# Patient Record
Sex: Female | Born: 1950 | Race: Black or African American | Hispanic: No | Marital: Married | Smoking: Never smoker
Health system: Southern US, Community
[De-identification: ages and names within clinical notes are randomized; demographics above are authoritative.]

## PROBLEM LIST (undated history)

## (undated) ENCOUNTER — Ambulatory Visit (HOSPITAL_COMMUNITY): Admission: EM | Payer: Medicare Other

## (undated) DIAGNOSIS — H269 Unspecified cataract: Secondary | ICD-10-CM

## (undated) DIAGNOSIS — F32A Depression, unspecified: Secondary | ICD-10-CM

## (undated) DIAGNOSIS — F329 Major depressive disorder, single episode, unspecified: Secondary | ICD-10-CM

## (undated) DIAGNOSIS — E559 Vitamin D deficiency, unspecified: Secondary | ICD-10-CM

## (undated) DIAGNOSIS — M1990 Unspecified osteoarthritis, unspecified site: Secondary | ICD-10-CM

## (undated) DIAGNOSIS — K219 Gastro-esophageal reflux disease without esophagitis: Secondary | ICD-10-CM

## (undated) DIAGNOSIS — K589 Irritable bowel syndrome without diarrhea: Secondary | ICD-10-CM

## (undated) DIAGNOSIS — M810 Age-related osteoporosis without current pathological fracture: Secondary | ICD-10-CM

## (undated) DIAGNOSIS — R7303 Prediabetes: Secondary | ICD-10-CM

## (undated) DIAGNOSIS — E785 Hyperlipidemia, unspecified: Secondary | ICD-10-CM

## (undated) HISTORY — DX: Major depressive disorder, single episode, unspecified: F32.9

## (undated) HISTORY — DX: Vitamin D deficiency, unspecified: E55.9

## (undated) HISTORY — DX: Unspecified cataract: H26.9

## (undated) HISTORY — DX: Depression, unspecified: F32.A

## (undated) HISTORY — DX: Unspecified osteoarthritis, unspecified site: M19.90

## (undated) HISTORY — DX: Hyperlipidemia, unspecified: E78.5

## (undated) HISTORY — DX: Age-related osteoporosis without current pathological fracture: M81.0

---

## 1998-02-16 ENCOUNTER — Other Ambulatory Visit: Admission: RE | Admit: 1998-02-16 | Discharge: 1998-02-16 | Payer: Self-pay | Admitting: *Deleted

## 1998-04-09 ENCOUNTER — Ambulatory Visit (HOSPITAL_COMMUNITY): Admission: RE | Admit: 1998-04-09 | Discharge: 1998-04-09 | Payer: Self-pay | Admitting: *Deleted

## 1998-07-24 ENCOUNTER — Ambulatory Visit (HOSPITAL_COMMUNITY): Admission: RE | Admit: 1998-07-24 | Discharge: 1998-07-24 | Payer: Self-pay | Admitting: *Deleted

## 1999-02-19 ENCOUNTER — Other Ambulatory Visit: Admission: RE | Admit: 1999-02-19 | Discharge: 1999-02-19 | Payer: Self-pay | Admitting: *Deleted

## 1999-05-08 ENCOUNTER — Ambulatory Visit (HOSPITAL_COMMUNITY): Admission: RE | Admit: 1999-05-08 | Discharge: 1999-05-08 | Payer: Self-pay | Admitting: *Deleted

## 2000-02-27 ENCOUNTER — Other Ambulatory Visit: Admission: RE | Admit: 2000-02-27 | Discharge: 2000-02-27 | Payer: Self-pay | Admitting: *Deleted

## 2000-05-13 ENCOUNTER — Ambulatory Visit (HOSPITAL_COMMUNITY): Admission: RE | Admit: 2000-05-13 | Discharge: 2000-05-13 | Payer: Self-pay | Admitting: *Deleted

## 2000-09-04 ENCOUNTER — Ambulatory Visit (HOSPITAL_COMMUNITY): Admission: RE | Admit: 2000-09-04 | Discharge: 2000-09-04 | Payer: Self-pay | Admitting: Gastroenterology

## 2000-09-04 ENCOUNTER — Encounter: Payer: Self-pay | Admitting: Gastroenterology

## 2000-10-15 ENCOUNTER — Ambulatory Visit (HOSPITAL_COMMUNITY): Admission: RE | Admit: 2000-10-15 | Discharge: 2000-10-15 | Payer: Self-pay | Admitting: Gastroenterology

## 2001-02-24 ENCOUNTER — Encounter: Payer: Self-pay | Admitting: *Deleted

## 2001-02-24 ENCOUNTER — Encounter: Admission: RE | Admit: 2001-02-24 | Discharge: 2001-02-24 | Payer: Self-pay | Admitting: *Deleted

## 2001-02-25 ENCOUNTER — Other Ambulatory Visit: Admission: RE | Admit: 2001-02-25 | Discharge: 2001-02-25 | Payer: Self-pay | Admitting: *Deleted

## 2001-07-29 ENCOUNTER — Ambulatory Visit (HOSPITAL_COMMUNITY): Admission: RE | Admit: 2001-07-29 | Discharge: 2001-07-29 | Payer: Self-pay | Admitting: *Deleted

## 2001-07-29 ENCOUNTER — Encounter: Payer: Self-pay | Admitting: *Deleted

## 2001-07-30 ENCOUNTER — Encounter: Admission: RE | Admit: 2001-07-30 | Discharge: 2001-07-30 | Payer: Self-pay | Admitting: *Deleted

## 2001-07-30 ENCOUNTER — Encounter: Payer: Self-pay | Admitting: *Deleted

## 2003-05-18 ENCOUNTER — Emergency Department (HOSPITAL_COMMUNITY): Admission: EM | Admit: 2003-05-18 | Discharge: 2003-05-18 | Payer: Self-pay | Admitting: Emergency Medicine

## 2003-05-31 ENCOUNTER — Other Ambulatory Visit: Admission: RE | Admit: 2003-05-31 | Discharge: 2003-05-31 | Payer: Self-pay | Admitting: Internal Medicine

## 2003-12-14 ENCOUNTER — Ambulatory Visit (HOSPITAL_COMMUNITY): Admission: RE | Admit: 2003-12-14 | Discharge: 2003-12-14 | Payer: Self-pay | Admitting: Internal Medicine

## 2004-04-02 ENCOUNTER — Ambulatory Visit (HOSPITAL_COMMUNITY): Admission: RE | Admit: 2004-04-02 | Discharge: 2004-04-02 | Payer: Self-pay | Admitting: Internal Medicine

## 2004-06-11 ENCOUNTER — Other Ambulatory Visit: Admission: RE | Admit: 2004-06-11 | Discharge: 2004-06-11 | Payer: Self-pay | Admitting: Internal Medicine

## 2005-06-24 ENCOUNTER — Other Ambulatory Visit: Admission: RE | Admit: 2005-06-24 | Discharge: 2005-06-24 | Payer: Self-pay | Admitting: Internal Medicine

## 2005-07-29 ENCOUNTER — Ambulatory Visit (HOSPITAL_COMMUNITY): Admission: RE | Admit: 2005-07-29 | Discharge: 2005-07-29 | Payer: Self-pay | Admitting: Internal Medicine

## 2007-07-20 ENCOUNTER — Ambulatory Visit (HOSPITAL_COMMUNITY): Admission: RE | Admit: 2007-07-20 | Discharge: 2007-07-20 | Payer: Self-pay | Admitting: Internal Medicine

## 2007-07-20 ENCOUNTER — Other Ambulatory Visit: Admission: RE | Admit: 2007-07-20 | Discharge: 2007-07-20 | Payer: Self-pay | Admitting: Internal Medicine

## 2008-04-07 ENCOUNTER — Ambulatory Visit (HOSPITAL_COMMUNITY): Admission: RE | Admit: 2008-04-07 | Discharge: 2008-04-07 | Payer: Self-pay | Admitting: Internal Medicine

## 2008-07-06 ENCOUNTER — Emergency Department (HOSPITAL_COMMUNITY): Admission: EM | Admit: 2008-07-06 | Discharge: 2008-07-06 | Payer: Self-pay | Admitting: Emergency Medicine

## 2009-06-16 HISTORY — PX: CARDIAC CATHETERIZATION: SHX172

## 2009-08-28 ENCOUNTER — Other Ambulatory Visit: Admission: RE | Admit: 2009-08-28 | Discharge: 2009-08-28 | Payer: Self-pay | Admitting: Internal Medicine

## 2009-11-14 ENCOUNTER — Ambulatory Visit (HOSPITAL_COMMUNITY): Admission: RE | Admit: 2009-11-14 | Discharge: 2009-11-14 | Payer: Self-pay | Admitting: Internal Medicine

## 2010-02-14 ENCOUNTER — Observation Stay (HOSPITAL_COMMUNITY): Admission: EM | Admit: 2010-02-14 | Discharge: 2010-02-16 | Payer: Self-pay | Admitting: Emergency Medicine

## 2010-02-15 ENCOUNTER — Encounter (INDEPENDENT_AMBULATORY_CARE_PROVIDER_SITE_OTHER): Payer: Self-pay | Admitting: Internal Medicine

## 2010-02-21 ENCOUNTER — Inpatient Hospital Stay (HOSPITAL_BASED_OUTPATIENT_CLINIC_OR_DEPARTMENT_OTHER): Admission: RE | Admit: 2010-02-21 | Discharge: 2010-02-21 | Payer: Self-pay | Admitting: Cardiology

## 2010-08-29 LAB — CARDIAC PANEL(CRET KIN+CKTOT+MB+TROPI)
CK, MB: 1.2 ng/mL (ref 0.3–4.0)
CK, MB: 1.3 ng/mL (ref 0.3–4.0)
Relative Index: INVALID (ref 0.0–2.5)
Relative Index: INVALID (ref 0.0–2.5)
Total CK: 70 U/L (ref 7–177)
Total CK: 72 U/L (ref 7–177)
Troponin I: 0.01 ng/mL (ref 0.00–0.06)
Troponin I: 0.02 ng/mL (ref 0.00–0.06)
Troponin I: <0.01 ng/mL (ref 0.00–0.06)

## 2010-08-29 LAB — POCT I-STAT, CHEM 8
Creatinine, Ser: 0.8 mg/dL (ref 0.4–1.2)
HCT: 40 % (ref 36.0–46.0)
Hemoglobin: 13.6 g/dL (ref 12.0–15.0)
Potassium: 4.1 mEq/L (ref 3.5–5.1)
Sodium: 139 mEq/L (ref 135–145)
TCO2: 27 mmol/L (ref 0–100)

## 2010-08-29 LAB — MAGNESIUM
Magnesium: 2 mg/dL (ref 1.5–2.5)
Magnesium: 2.2 mg/dL (ref 1.5–2.5)

## 2010-08-29 LAB — CBC
HCT: 32.3 % — ABNORMAL LOW (ref 36.0–46.0)
HCT: 38 % (ref 36.0–46.0)
Hemoglobin: 10.9 g/dL — ABNORMAL LOW (ref 12.0–15.0)
Hemoglobin: 12.9 g/dL (ref 12.0–15.0)
MCH: 28.8 pg (ref 26.0–34.0)
MCH: 28.9 pg (ref 26.0–34.0)
MCHC: 33.7 g/dL (ref 30.0–36.0)
MCHC: 33.9 g/dL (ref 30.0–36.0)
MCV: 83.9 fL (ref 78.0–100.0)
MCV: 84.8 fL (ref 78.0–100.0)
MCV: 85.2 fL (ref 78.0–100.0)
Platelets: 172 10*3/uL (ref 150–400)
Platelets: 173 10*3/uL (ref 150–400)
RBC: 4.46 MIL/uL (ref 3.87–5.11)
RBC: 4.48 MIL/uL (ref 3.87–5.11)
RDW: 12.4 % (ref 11.5–15.5)
RDW: 12.8 % (ref 11.5–15.5)
RDW: 12.8 % (ref 11.5–15.5)
WBC: 5.1 10*3/uL (ref 4.0–10.5)
WBC: 5.1 10*3/uL (ref 4.0–10.5)

## 2010-08-29 LAB — COMPREHENSIVE METABOLIC PANEL
ALT: 13 U/L (ref 0–35)
ALT: 16 U/L (ref 0–35)
AST: 22 U/L (ref 0–37)
Albumin: 3.4 g/dL — ABNORMAL LOW (ref 3.5–5.2)
Alkaline Phosphatase: 59 U/L (ref 39–117)
Alkaline Phosphatase: 79 U/L (ref 39–117)
BUN: 5 mg/dL — ABNORMAL LOW (ref 6–23)
BUN: 8 mg/dL (ref 6–23)
CO2: 25 mEq/L (ref 19–32)
CO2: 26 mEq/L (ref 19–32)
Calcium: 8.5 mg/dL (ref 8.4–10.5)
Calcium: 8.7 mg/dL (ref 8.4–10.5)
Chloride: 105 mEq/L (ref 96–112)
Creatinine, Ser: 0.72 mg/dL (ref 0.4–1.2)
GFR calc Af Amer: >60 mL/min (ref >60)
GFR calc non Af Amer: >60 mL/min (ref >60)
GFR calc non Af Amer: >60 mL/min (ref >60)
Glucose, Bld: 85 mg/dL (ref 70–99)
Glucose, Bld: 89 mg/dL (ref 70–99)
Potassium: 3.9 mEq/L (ref 3.5–5.1)
Sodium: 139 mEq/L (ref 135–145)
Total Bilirubin: 0.6 mg/dL (ref 0.3–1.2)
Total Protein: 5.2 g/dL — ABNORMAL LOW (ref 6.0–8.3)
Total Protein: 6.3 g/dL (ref 6.0–8.3)

## 2010-08-29 LAB — POCT CARDIAC MARKERS
CKMB, poc: <1 ng/mL — ABNORMAL LOW (ref 1.0–8.0)
CKMB, poc: <1 ng/mL — ABNORMAL LOW (ref 1.0–8.0)
CKMB, poc: <1 ng/mL — ABNORMAL LOW (ref 1.0–8.0)
Myoglobin, poc: 10.3 ng/mL — ABNORMAL LOW (ref 12–200)
Myoglobin, poc: 10.7 ng/mL — ABNORMAL LOW (ref 12–200)
Myoglobin, poc: 9.1 ng/mL — ABNORMAL LOW (ref 12–200)
Troponin i, poc: <0.05 ng/mL (ref 0.00–0.09)
Troponin i, poc: <0.05 ng/mL (ref 0.00–0.09)

## 2010-08-29 LAB — LIPID PANEL
Cholesterol: 198 mg/dL (ref 0–200)
HDL: 55 mg/dL (ref >39)
LDL Cholesterol: 131 mg/dL — ABNORMAL HIGH (ref 0–99)
Total CHOL/HDL Ratio: 3.6 RATIO
Triglycerides: 62 mg/dL (ref <150)
VLDL: 12 mg/dL (ref 0–40)

## 2010-08-29 LAB — CATECHOLAMINES, FRACTIONATED, PLASMA: Norepinephrine: 336 pg/mL

## 2010-08-29 LAB — CK TOTAL AND CKMB (NOT AT ARMC)
CK, MB: 1.4 ng/mL (ref 0.3–4.0)
Relative Index: INVALID (ref 0.0–2.5)
Total CK: 80 U/L (ref 7–177)

## 2010-08-29 LAB — D-DIMER, QUANTITATIVE: D-Dimer, Quant: <0.22 ug/mL-FEU (ref 0.00–0.48)

## 2010-08-29 LAB — TROPONIN I: Troponin I: 0.03 ng/mL (ref 0.00–0.06)

## 2010-08-29 LAB — PROTIME-INR: Prothrombin Time: 11.9 seconds (ref 11.6–15.2)

## 2010-08-29 LAB — T4, FREE: Free T4: 1.13 ng/dL (ref 0.80–1.80)

## 2010-08-29 LAB — TSH: TSH: 1.8 u[IU]/mL (ref 0.350–4.500)

## 2010-08-29 LAB — CORTISOL: Cortisol, Plasma: 8.5 ug/dL

## 2010-08-29 LAB — LIPASE, BLOOD: Lipase: 28 U/L (ref 11–59)

## 2010-08-29 LAB — DIFFERENTIAL
Basophils Absolute: 0 10*3/uL (ref 0.0–0.1)
Eosinophils Absolute: 0.2 10*3/uL (ref 0.0–0.7)
Eosinophils Relative: 3 % (ref 0–5)
Lymphs Abs: 2.7 10*3/uL (ref 0.7–4.0)
Neutrophils Relative %: 35 % — ABNORMAL LOW (ref 43–77)

## 2010-09-30 LAB — POCT I-STAT, CHEM 8
BUN: 14 mg/dL (ref 6–23)
Chloride: 105 mEq/L (ref 96–112)
Creatinine, Ser: 0.9 mg/dL (ref 0.4–1.2)
Glucose, Bld: 101 mg/dL — ABNORMAL HIGH (ref 70–99)
Hemoglobin: 12.9 g/dL (ref 12.0–15.0)
Potassium: 4.3 mEq/L (ref 3.5–5.1)
Sodium: 139 mEq/L (ref 135–145)

## 2010-09-30 LAB — POCT CARDIAC MARKERS: Myoglobin, poc: 7 ng/mL — ABNORMAL LOW (ref 12–200)

## 2010-11-01 NOTE — Procedures (Signed)
[facility]  Patient:    [name], [name]                      MRN: [id number] Adm. Date:  10/15/00 Attending:  Anselmo Rod, M.D. CC:         Pershing Cox, M.D.                           Procedure Report  DATE OF BIRTH:  01-26-1951.  PROCEDURE:  Esophagogastroduodenoscopy.  ENDOSCOPIST:  Anselmo Rod, M.D.  INSTRUMENT USED:  Olympus video panendoscope.  INDICATION FOR PROCEDURE:  Epigastric pain in a 60 year old African-American female.  Rule out peptic ulcer disease, esophagitis, gastritis, etc.  PREPROCEDURE PHYSICAL:  VITAL SIGNS:  The patient had stable vital signs.  NECK:  Supple.  CHEST:  Clear to auscultation.  S1, S2 regular.  ABDOMEN:  Soft with normal abdominal bowel sounds.  DESCRIPTION OF PROCEDURE:  The patient was placed in the left lateral decubitus position and sedated with 25 mg of Demerol and 2.5 mg of Versed intravenously.  Once the patient was adequately sedate and maintained on low-flow oxygen and continuous cardiac monitoring, the Olympus video panendoscope was advanced through the mouthpiece, over the tongue, into the esophagus under direct vision.  The entire esophagus appeared normal, and so did the gastric mucosa and the proximal small bowel.  IMPRESSION:  Normal EGD.  No ulcers, masses, or polyps seen.  RECOMMENDATIONS: 1. Continue PPIs for now. 2. Avoid all nonsteroidals. 3. Outpatient follow-up in the next four weeks. DD:  10/15/00 TD:  10/17/00 Job: [id number]

## 2011-10-06 ENCOUNTER — Ambulatory Visit (HOSPITAL_COMMUNITY)
Admission: RE | Admit: 2011-10-06 | Discharge: 2011-10-06 | Disposition: A | Discharge status: Home / Self Care | Payer: 59 | Source: Ambulatory Visit | Attending: Internal Medicine | Admitting: Internal Medicine

## 2011-10-06 ENCOUNTER — Other Ambulatory Visit (HOSPITAL_COMMUNITY): Payer: Self-pay | Admitting: Internal Medicine

## 2011-10-06 DIAGNOSIS — R109 Unspecified abdominal pain: Secondary | ICD-10-CM | POA: Insufficient documentation

## 2011-10-06 DIAGNOSIS — M549 Dorsalgia, unspecified: Secondary | ICD-10-CM

## 2011-10-06 DIAGNOSIS — M79609 Pain in unspecified limb: Secondary | ICD-10-CM | POA: Insufficient documentation

## 2011-10-06 DIAGNOSIS — K219 Gastro-esophageal reflux disease without esophagitis: Secondary | ICD-10-CM | POA: Insufficient documentation

## 2012-06-19 ENCOUNTER — Encounter (HOSPITAL_COMMUNITY): Payer: Self-pay | Admitting: Emergency Medicine

## 2012-06-19 ENCOUNTER — Emergency Department (HOSPITAL_COMMUNITY)
Admission: EM | Admit: 2012-06-19 | Discharge: 2012-06-19 | Disposition: A | Discharge status: Home / Self Care | Payer: 59 | Attending: Emergency Medicine | Admitting: Emergency Medicine

## 2012-06-19 DIAGNOSIS — Z8719 Personal history of other diseases of the digestive system: Secondary | ICD-10-CM | POA: Insufficient documentation

## 2012-06-19 DIAGNOSIS — R51 Headache: Secondary | ICD-10-CM | POA: Insufficient documentation

## 2012-06-19 DIAGNOSIS — Z7982 Long term (current) use of aspirin: Secondary | ICD-10-CM | POA: Insufficient documentation

## 2012-06-19 DIAGNOSIS — Z862 Personal history of diseases of the blood and blood-forming organs and certain disorders involving the immune mechanism: Secondary | ICD-10-CM | POA: Insufficient documentation

## 2012-06-19 DIAGNOSIS — R209 Unspecified disturbances of skin sensation: Secondary | ICD-10-CM | POA: Insufficient documentation

## 2012-06-19 DIAGNOSIS — Z79899 Other long term (current) drug therapy: Secondary | ICD-10-CM | POA: Insufficient documentation

## 2012-06-19 DIAGNOSIS — D72819 Decreased white blood cell count, unspecified: Secondary | ICD-10-CM | POA: Insufficient documentation

## 2012-06-19 DIAGNOSIS — Z8639 Personal history of other endocrine, nutritional and metabolic disease: Secondary | ICD-10-CM | POA: Insufficient documentation

## 2012-06-19 DIAGNOSIS — H538 Other visual disturbances: Secondary | ICD-10-CM | POA: Insufficient documentation

## 2012-06-19 HISTORY — DX: Gastro-esophageal reflux disease without esophagitis: K21.9

## 2012-06-19 HISTORY — DX: Prediabetes: R73.03

## 2012-06-19 LAB — CBC WITH DIFFERENTIAL/PLATELET
Eosinophils Absolute: 0.1 10*3/uL (ref 0.0–0.7)
HCT: 39.4 % (ref 36.0–46.0)
Hemoglobin: 13.6 g/dL (ref 12.0–15.0)
Lymphs Abs: 1.3 10*3/uL (ref 0.7–4.0)
MCH: 28.5 pg (ref 26.0–34.0)
MCHC: 34.5 g/dL (ref 30.0–36.0)
Monocytes Absolute: 0.2 10*3/uL (ref 0.1–1.0)
Monocytes Relative: 8 % (ref 3–12)
Neutrophils Relative %: 39 % — ABNORMAL LOW (ref 43–77)
RBC: 4.77 MIL/uL (ref 3.87–5.11)

## 2012-06-19 LAB — COMPREHENSIVE METABOLIC PANEL
Alkaline Phosphatase: 88 U/L (ref 39–117)
BUN: 15 mg/dL (ref 6–23)
Chloride: 104 mEq/L (ref 96–112)
Creatinine, Ser: 0.64 mg/dL (ref 0.50–1.10)
GFR calc Af Amer: >90 mL/min (ref >90)
Glucose, Bld: 92 mg/dL (ref 70–99)
Potassium: 4 mEq/L (ref 3.5–5.1)
Total Bilirubin: 0.3 mg/dL (ref 0.3–1.2)
Total Protein: 7 g/dL (ref 6.0–8.3)

## 2012-06-19 LAB — URINALYSIS, ROUTINE W REFLEX MICROSCOPIC
Ketones, ur: NEGATIVE mg/dL
Leukocytes, UA: NEGATIVE
Nitrite: NEGATIVE
Protein, ur: NEGATIVE mg/dL

## 2012-06-19 NOTE — ED Notes (Signed)
Pt sts intermittent blurred vision and intermittent sharp pain on L side of head.  Currently, denies these symptoms.  Sts R lower leg feels like pins and needles x 3 days.  Pain 7/10.  Denies injury.

## 2012-06-19 NOTE — ED Notes (Signed)
Pt states that she has a hx of "sharp pains in her head".  States that they normally last one second--sharp pain and then gone.  States that this morning she had a sharp pain on the top/left side of her head but lasted for a few seconds longer than normal and traveled down towards her ear.  Not c/o pain at this time.  Also states that she has had blurred vision and numbness in her right leg over the past 2-3 days.

## 2012-06-19 NOTE — ED Provider Notes (Signed)
History     CSN: [id number]  Arrival date & time 06/19/12  3295   First MD Initiated Contact with Patient 06/19/12 1004      Chief Complaint  Patient presents with  . Blurred Vision  . Headache  . Numbness    (Consider location/radiation/quality/duration/timing/severity/associated sxs/prior treatment) HPI  [name] is a 62 y.o. female with no past medical history except for GERD complaining of sharp lancinating pain to the top left head starting this morning at 8:30. Patient has had similar prior episodes last for only a second but today it lasted for 30 seconds. Pain radiated down deeply into her head which is different from prior episodes. She also reports a pins and needles paresthesia to the left lower extremity intermittently for the last 3 days. Patient has had generalized intermittent blurred vision intermittently for the last 6 days as well. Patient also reports a mild nausea, and frothy urine. Patient denies fever, chest pain, palpitations, abdominal pain, ataxia, dysarthria, peripheral edema.   PCP [facility]  Past Medical History  Diagnosis Date  . GERD (gastroesophageal reflux disease)   . Prediabetes     History reviewed. No pertinent past surgical history.  History reviewed. No pertinent family history.  History  Substance Use Topics  . Smoking status: Never Smoker   . Smokeless tobacco: Not on file  . Alcohol Use: No    OB History    Grav Para Term Preterm Abortions TAB SAB Ect Mult Living                  Review of Systems  Constitutional: Negative for fever.  Eyes: Positive for visual disturbance.  Respiratory: Negative for shortness of breath.   Cardiovascular: Negative for chest pain.  Gastrointestinal: Negative for nausea, vomiting, abdominal pain and diarrhea.  Neurological: Positive for numbness and headaches.  All other systems reviewed and are negative.    Allergies  Review of patient's allergies indicates not on file.  Home  Medications  No current outpatient prescriptions on file.  BP 117/67  Pulse 76  Temp 97.5 F (36.4 C) (Oral)  Resp 18  SpO2 100%  Physical Exam  Nursing note and vitals reviewed. Constitutional: She is oriented to person, place, and time. She appears well-developed and well-nourished. No distress.  HENT:  Head: Normocephalic.  Mouth/Throat: Oropharynx is clear and moist.       Mild tenderness to the right temporal artery with no induration.  Eyes: Conjunctivae normal and EOM are normal. Pupils are equal, round, and reactive to light.  Cardiovascular: Normal rate, regular rhythm and intact distal pulses.   Pulmonary/Chest: Effort normal and breath sounds normal. No stridor. No respiratory distress. She has no wheezes. She has no rales.  Abdominal: Soft. She exhibits no distension and no mass. There is no tenderness. There is no guarding.  Musculoskeletal: Normal range of motion. She exhibits no edema.  Neurological: She is alert and oriented to person, place, and time.       Cranial nerves III through XII intact, strength 5 out of 5x4 extremities, negative pronator drift, finger to nose and heel-to-shin coordinated, sensation intact to pinprick and light touch, gait is coordinated and Romberg is negative.   Psychiatric: She has a normal mood and affect.    ED Course  Procedures (including critical care time)  Labs Reviewed  CBC WITH DIFFERENTIAL - Abnormal; Notable for the following:    WBC 2.6 (*)     Neutrophils Relative 39 (*)  Neutro Abs 1.0 (*)     Lymphocytes Relative 50 (*)     All other components within normal limits  COMPREHENSIVE METABOLIC PANEL  URINALYSIS, ROUTINE W REFLEX MICROSCOPIC  SEDIMENTATION RATE   No results found.   1. Headache   2. Blurred vision, bilateral   3. Leukopenia       MDM  Otherwise healthy 62 year old female with headache, blurred vision and paresthesia, now resolved.Neurologic exam is normal.  Cervix normal and blood work is  unremarkable except for a mild leukopenia. Will range for outpatient MRI.  Discussed case with attending who agrees with plan and stability to d/c to home.    Pt verbalized understanding and agrees with care plan. Outpatient follow-up and return precautions given.            Wynetta Emery, PA-C 06/20/12 [phone]

## 2012-06-20 NOTE — ED Provider Notes (Signed)
Medical screening examination/treatment/procedure(s) were performed by non-physician practitioner and as supervising physician I was immediately available for consultation/collaboration.   [name]. Oletta Lamas, MD 06/20/12 2027

## 2012-07-22 ENCOUNTER — Other Ambulatory Visit (HOSPITAL_COMMUNITY): Payer: Self-pay | Admitting: Internal Medicine

## 2012-07-22 ENCOUNTER — Ambulatory Visit (HOSPITAL_COMMUNITY)
Admission: RE | Admit: 2012-07-22 | Discharge: 2012-07-22 | Disposition: A | Discharge status: Home / Self Care | Payer: 59 | Source: Ambulatory Visit | Attending: Internal Medicine | Admitting: Internal Medicine

## 2012-07-22 DIAGNOSIS — S139XXA Sprain of joints and ligaments of unspecified parts of neck, initial encounter: Secondary | ICD-10-CM

## 2012-07-22 DIAGNOSIS — M545 Low back pain, unspecified: Secondary | ICD-10-CM | POA: Insufficient documentation

## 2012-07-22 DIAGNOSIS — G9589 Other specified diseases of spinal cord: Secondary | ICD-10-CM | POA: Insufficient documentation

## 2012-07-22 DIAGNOSIS — M5380 Other specified dorsopathies, site unspecified: Secondary | ICD-10-CM | POA: Insufficient documentation

## 2012-07-22 DIAGNOSIS — R079 Chest pain, unspecified: Secondary | ICD-10-CM | POA: Insufficient documentation

## 2012-07-22 DIAGNOSIS — R29890 Loss of height: Secondary | ICD-10-CM | POA: Insufficient documentation

## 2012-08-04 ENCOUNTER — Other Ambulatory Visit: Payer: Self-pay | Admitting: Internal Medicine

## 2012-08-10 ENCOUNTER — Other Ambulatory Visit: Payer: 59

## 2012-08-16 ENCOUNTER — Other Ambulatory Visit: Payer: 59

## 2012-08-24 ENCOUNTER — Ambulatory Visit
Admission: RE | Admit: 2012-08-24 | Discharge: 2012-08-24 | Disposition: A | Discharge status: Home / Self Care | Payer: 59 | Source: Ambulatory Visit | Attending: Internal Medicine | Admitting: Internal Medicine

## 2012-08-24 MED ORDER — GADOBENATE DIMEGLUMINE 529 MG/ML IV SOLN
13.0000 mL | Freq: Once | INTRAVENOUS | Status: AC | PRN
  Administered 2012-08-24: 13 mL via INTRAVENOUS

## 2012-11-18 ENCOUNTER — Other Ambulatory Visit (HOSPITAL_COMMUNITY)
Admission: RE | Admit: 2012-11-18 | Discharge: 2012-11-18 | Disposition: A | Discharge status: Home / Self Care | Payer: 59 | Source: Ambulatory Visit | Attending: Internal Medicine | Admitting: Internal Medicine

## 2012-11-18 DIAGNOSIS — N760 Acute vaginitis: Secondary | ICD-10-CM | POA: Insufficient documentation

## 2012-11-18 DIAGNOSIS — Z01419 Encounter for gynecological examination (general) (routine) without abnormal findings: Secondary | ICD-10-CM | POA: Insufficient documentation

## 2013-04-22 ENCOUNTER — Other Ambulatory Visit: Payer: Self-pay | Admitting: Physician Assistant

## 2013-05-17 ENCOUNTER — Encounter: Payer: Self-pay | Admitting: Physician Assistant

## 2013-05-19 ENCOUNTER — Ambulatory Visit: Payer: Self-pay | Admitting: Physician Assistant

## 2013-06-28 ENCOUNTER — Ambulatory Visit (INDEPENDENT_AMBULATORY_CARE_PROVIDER_SITE_OTHER): Payer: 59 | Admitting: Physician Assistant

## 2013-06-28 ENCOUNTER — Encounter: Payer: Self-pay | Admitting: Physician Assistant

## 2013-06-28 VITALS — BP 100/60 | HR 72 | Temp 98.1°F | Resp 16 | Ht 64.0 in | Wt 143.0 lb

## 2013-06-28 DIAGNOSIS — E785 Hyperlipidemia, unspecified: Secondary | ICD-10-CM

## 2013-06-28 DIAGNOSIS — K219 Gastro-esophageal reflux disease without esophagitis: Secondary | ICD-10-CM | POA: Insufficient documentation

## 2013-06-28 DIAGNOSIS — E559 Vitamin D deficiency, unspecified: Secondary | ICD-10-CM

## 2013-06-28 DIAGNOSIS — E782 Mixed hyperlipidemia: Secondary | ICD-10-CM | POA: Insufficient documentation

## 2013-06-28 DIAGNOSIS — R7309 Other abnormal glucose: Secondary | ICD-10-CM

## 2013-06-28 DIAGNOSIS — R109 Unspecified abdominal pain: Secondary | ICD-10-CM

## 2013-06-28 DIAGNOSIS — Z79899 Other long term (current) drug therapy: Secondary | ICD-10-CM

## 2013-06-28 DIAGNOSIS — R7303 Prediabetes: Secondary | ICD-10-CM | POA: Insufficient documentation

## 2013-06-28 LAB — CBC WITH DIFFERENTIAL/PLATELET
BASOS ABS: 0 10*3/uL (ref 0.0–0.1)
BASOS PCT: 1 % (ref 0–1)
Eosinophils Absolute: 0.1 10*3/uL (ref 0.0–0.7)
Eosinophils Relative: 3 % (ref 0–5)
HCT: 37.8 % (ref 36.0–46.0)
Hemoglobin: 12.6 g/dL (ref 12.0–15.0)
Lymphocytes Relative: 51 % — ABNORMAL HIGH (ref 12–46)
Lymphs Abs: 2.1 10*3/uL (ref 0.7–4.0)
MCH: 28.3 pg (ref 26.0–34.0)
MCHC: 33.3 g/dL (ref 30.0–36.0)
MCV: 84.9 fL (ref 78.0–100.0)
MONO ABS: 0.3 10*3/uL (ref 0.1–1.0)
Monocytes Relative: 7 % (ref 3–12)
NEUTROS ABS: 1.6 10*3/uL — AB (ref 1.7–7.7)
NEUTROS PCT: 38 % — AB (ref 43–77)
PLATELETS: 214 10*3/uL (ref 150–400)
RBC: 4.45 MIL/uL (ref 3.87–5.11)
RDW: 13.6 % (ref 11.5–15.5)
WBC: 4.1 10*3/uL (ref 4.0–10.5)

## 2013-06-28 NOTE — Progress Notes (Signed)
HPI Patient presents for 3 month follow up with hyperlipidemia, prediabetes and vitamin D. Patient's blood pressure has been controlled at home, today their BP is BP: 100/60 mmHg  Patient denies chest pain, shortness of breath, dizziness.  Patient's cholesterol is diet controlled.. The cholesterol last visit was 143 but she declines medications.  The patient has been working on diet and exercise for prediabetes, and denies changes in vision, polys, and paresthesias. 5.7 (5.9) Patient is on Vitamin D supplement.    Patient is seeing PT for neck/back pain and states it has helped significantly. But currently she has had bilateral hip/lower AB pain for several weeks. She states it is worse with a BM, with sitting to standing, and worse at night.   Current Medications:  Current Outpatient Prescriptions on File Prior to Visit  Medication Sig Dispense Refill  . aspirin 81 MG tablet Take 81 mg by mouth daily.      . Cholecalciferol (D-3-5) 5000 UNITS capsule Take 5,000 Units by mouth daily.      . cycloSPORINE (RESTASIS) 0.05 % ophthalmic emulsion Place 1 drop into both eyes 2 (two) times daily.      . Lutein 20 MG TABS Take by mouth.      . magnesium chloride (SLOW-MAG) 64 MG TBEC Take 1 tablet by mouth daily.      . Multiple Vitamins-Minerals (WOMENS MULTI VITAMIN & MINERAL PO) Take 1 tablet by mouth daily.      Marland Kitchen NEXIUM 40 MG capsule take 1 capsule by mouth once daily  30 capsule  5   No current facility-administered medications on file prior to visit.   Medical History:  Past Medical History  Diagnosis Date  . GERD (gastroesophageal reflux disease)   . Prediabetes   . Depression   . Arthritis     cervical and lumbar  . Vitamin D deficiency   . Hyperlipidemia    Allergies:  Allergies  Allergen Reactions  . Bactrim [Sulfamethoxazole-Tmp Ds]   . Protonix [Pantoprazole Sodium]     ROS Constitutional: Denies fever, chills, headaches, insomnia, fatigue, night sweats Eyes: Denies  redness, blurred vision, diplopia, discharge, itchy, watery eyes.  ENT: Denies congestion, post nasal drip, sore throat, earache, dental pain, Tinnitus, Vertigo, Sinus pain, snoring.  Cardio: Denies chest pain, palpitations, irregular heartbeat, dyspnea, diaphoresis, orthopnea, PND, claudication, edema Respiratory: denies cough, shortness of breath, wheezing.  Gastrointestinal: Denies dysphagia, heartburn, AB pain/ cramps, N/V, diarrhea, constipation, hematemesis, melena, hematochezia,  hemorrhoids Genitourinary: Denies dysuria, frequency, urgency, nocturia, hesitancy, discharge, hematuria, flank pain Musculoskeletal: Denies myalgia, stiffness, pain, swelling and strain/sprain. Skin: Denies pruritis, rash, changing in skin lesion Neuro: Denies Weakness, tremor, incoordination, spasms, pain Psychiatric: Denies confusion, memory loss, sensory loss Endocrine: Denies change in weight, skin, hair change, nocturia Diabetic Polys, Denies visual blurring, hyper /hypo glycemic episodes, and paresthesia, Heme/Lymph: Denies Excessive bleeding, bruising, enlarged lymph nodes  Family history- Review and unchanged Social history- Review and unchanged Physical Exam: Filed Vitals:   06/28/13 1629  BP: 100/60  Pulse: 72  Temp: 98.1 F (36.7 C)  Resp: 16   Filed Weights   06/28/13 1629  Weight: 143 lb (64.864 kg)   General Appearance: Well nourished, in no apparent distress. Eyes: PERRLA, EOMs, conjunctiva no swelling or erythema Sinuses: No Frontal/maxillary tenderness ENT/Mouth: Ext aud canals clear, TMs without erythema, bulging. No erythema, swelling, or exudate on post pharynx.  Tonsils not swollen or erythematous. Hearing normal.  Neck: Supple, thyroid normal.  Respiratory: Respiratory effort normal, BS equal bilaterally  without rales, rhonchi, wheezing or stridor.  Cardio: RRR with no MRGs. Brisk peripheral pulses without edema.  Abdomen: Soft, + BS.  Mild bilateral tenderness, no guarding,  rebound, hernias, masses. Lymphatics: Non tender without lymphadenopathy.  Musculoskeletal: Full ROM bilateral hips no pain, 5/5 strength, normal gait Skin: Warm, dry without rashes, lesions, ecchymosis.  Neuro: Cranial nerves intact. Normal muscle tone, no cerebellar symptoms. Sensation intact.  Psych: Awake and oriented X 3, normal affect, Insight and Judgment appropriate.   Assessment and Plan:  Hypertension: Continue medication, monitor blood pressure at home.  Continue DASH diet. Cholesterol: Continue diet and exercise. Check cholesterol.  Pre-diabetes-Continue diet and exercise. Check A1C Vitamin D Def- check level and continue medications.  Abdominal pain/bloating- check pelvic/AB U/S rule out ovarian mass/CA Hip pain?- cont PT  Continue diet and meds as discussed. Further disposition pending results of labs.  Vicie Mutters 4:44 PM

## 2013-06-28 NOTE — Patient Instructions (Signed)

## 2013-06-29 LAB — HEMOGLOBIN A1C
HEMOGLOBIN A1C: 5.8 % — AB (ref <5.7)
MEAN PLASMA GLUCOSE: 120 mg/dL — AB (ref <117)

## 2013-06-29 LAB — BASIC METABOLIC PANEL WITH GFR
BUN: 19 mg/dL (ref 6–23)
CALCIUM: 9.5 mg/dL (ref 8.4–10.5)
CO2: 28 mEq/L (ref 19–32)
CREATININE: 0.7 mg/dL (ref 0.50–1.10)
Chloride: 104 mEq/L (ref 96–112)
GFR, Est African American: >89 mL/min
GFR, Est Non African American: >89 mL/min
GLUCOSE: 77 mg/dL (ref 70–99)
POTASSIUM: 4.4 meq/L (ref 3.5–5.3)
SODIUM: 139 meq/L (ref 135–145)

## 2013-06-29 LAB — LIPID PANEL
CHOL/HDL RATIO: 2.9 ratio
CHOLESTEROL: 211 mg/dL — AB (ref 0–200)
HDL: 74 mg/dL (ref >39)
LDL Cholesterol: 126 mg/dL — ABNORMAL HIGH (ref 0–99)
Triglycerides: 57 mg/dL (ref <150)
VLDL: 11 mg/dL (ref 0–40)

## 2013-06-29 LAB — HEPATIC FUNCTION PANEL
ALBUMIN: 4 g/dL (ref 3.5–5.2)
ALK PHOS: 86 U/L (ref 39–117)
ALT: 15 U/L (ref 0–35)
AST: 18 U/L (ref 0–37)
BILIRUBIN TOTAL: 0.2 mg/dL — AB (ref 0.3–1.2)
Total Protein: 6.6 g/dL (ref 6.0–8.3)

## 2013-06-29 LAB — MAGNESIUM: Magnesium: 2 mg/dL (ref 1.5–2.5)

## 2013-06-29 LAB — VITAMIN D 25 HYDROXY (VIT D DEFICIENCY, FRACTURES): Vit D, 25-Hydroxy: 65 ng/mL (ref 30–89)

## 2013-06-29 LAB — TSH: TSH: 0.788 u[IU]/mL (ref 0.350–4.500)

## 2013-06-30 ENCOUNTER — Other Ambulatory Visit: Payer: Self-pay

## 2013-06-30 DIAGNOSIS — R109 Unspecified abdominal pain: Secondary | ICD-10-CM

## 2013-07-05 ENCOUNTER — Other Ambulatory Visit: Payer: 59

## 2013-11-04 ENCOUNTER — Emergency Department (HOSPITAL_COMMUNITY): Payer: 59

## 2013-11-04 ENCOUNTER — Other Ambulatory Visit: Payer: Self-pay | Admitting: Emergency Medicine

## 2013-11-04 ENCOUNTER — Encounter (HOSPITAL_COMMUNITY): Payer: Self-pay | Admitting: Emergency Medicine

## 2013-11-04 ENCOUNTER — Emergency Department (HOSPITAL_COMMUNITY)
Admission: EM | Admit: 2013-11-04 | Discharge: 2013-11-04 | Disposition: A | Discharge status: Home / Self Care | Payer: 59 | Attending: Emergency Medicine | Admitting: Emergency Medicine

## 2013-11-04 DIAGNOSIS — Z7982 Long term (current) use of aspirin: Secondary | ICD-10-CM | POA: Insufficient documentation

## 2013-11-04 DIAGNOSIS — M545 Low back pain, unspecified: Secondary | ICD-10-CM | POA: Insufficient documentation

## 2013-11-04 DIAGNOSIS — Z8739 Personal history of other diseases of the musculoskeletal system and connective tissue: Secondary | ICD-10-CM | POA: Insufficient documentation

## 2013-11-04 DIAGNOSIS — M549 Dorsalgia, unspecified: Secondary | ICD-10-CM

## 2013-11-04 DIAGNOSIS — Z79899 Other long term (current) drug therapy: Secondary | ICD-10-CM | POA: Insufficient documentation

## 2013-11-04 DIAGNOSIS — K219 Gastro-esophageal reflux disease without esophagitis: Secondary | ICD-10-CM | POA: Insufficient documentation

## 2013-11-04 DIAGNOSIS — Z862 Personal history of diseases of the blood and blood-forming organs and certain disorders involving the immune mechanism: Secondary | ICD-10-CM | POA: Insufficient documentation

## 2013-11-04 DIAGNOSIS — Z8639 Personal history of other endocrine, nutritional and metabolic disease: Secondary | ICD-10-CM | POA: Insufficient documentation

## 2013-11-04 DIAGNOSIS — Z8659 Personal history of other mental and behavioral disorders: Secondary | ICD-10-CM | POA: Insufficient documentation

## 2013-11-04 DIAGNOSIS — R110 Nausea: Secondary | ICD-10-CM | POA: Insufficient documentation

## 2013-11-04 LAB — CBC WITH DIFFERENTIAL/PLATELET
BASOS PCT: 1 % (ref 0–1)
Basophils Absolute: 0 10*3/uL (ref 0.0–0.1)
EOS ABS: 0.1 10*3/uL (ref 0.0–0.7)
EOS PCT: 2 % (ref 0–5)
HEMATOCRIT: 39.1 % (ref 36.0–46.0)
HEMOGLOBIN: 13.4 g/dL (ref 12.0–15.0)
Lymphocytes Relative: 48 % — ABNORMAL HIGH (ref 12–46)
Lymphs Abs: 1.6 10*3/uL (ref 0.7–4.0)
MCH: 28.5 pg (ref 26.0–34.0)
MCHC: 34.3 g/dL (ref 30.0–36.0)
MCV: 83.2 fL (ref 78.0–100.0)
MONO ABS: 0.3 10*3/uL (ref 0.1–1.0)
MONOS PCT: 8 % (ref 3–12)
Neutro Abs: 1.4 10*3/uL — ABNORMAL LOW (ref 1.7–7.7)
Neutrophils Relative %: 41 % — ABNORMAL LOW (ref 43–77)
Platelets: 175 10*3/uL (ref 150–400)
RBC: 4.7 MIL/uL (ref 3.87–5.11)
RDW: 12.2 % (ref 11.5–15.5)
WBC: 3.3 10*3/uL — ABNORMAL LOW (ref 4.0–10.5)

## 2013-11-04 LAB — URINALYSIS, ROUTINE W REFLEX MICROSCOPIC
BILIRUBIN URINE: NEGATIVE
GLUCOSE, UA: NEGATIVE mg/dL
Hgb urine dipstick: NEGATIVE
KETONES UR: NEGATIVE mg/dL
Leukocytes, UA: NEGATIVE
Nitrite: NEGATIVE
PH: 5 (ref 5.0–8.0)
PROTEIN: NEGATIVE mg/dL
Specific Gravity, Urine: 1.015 (ref 1.005–1.030)
Urobilinogen, UA: 0.2 mg/dL (ref 0.0–1.0)

## 2013-11-04 LAB — BASIC METABOLIC PANEL
BUN: 17 mg/dL (ref 6–23)
CALCIUM: 9.9 mg/dL (ref 8.4–10.5)
CHLORIDE: 101 meq/L (ref 96–112)
CO2: 26 mEq/L (ref 19–32)
CREATININE: 0.69 mg/dL (ref 0.50–1.10)
GFR calc non Af Amer: >90 mL/min (ref >90)
Glucose, Bld: 96 mg/dL (ref 70–99)
Potassium: 4.2 mEq/L (ref 3.7–5.3)
Sodium: 140 mEq/L (ref 137–147)

## 2013-11-04 MED ORDER — PREDNISONE 10 MG PO TABS: ORAL_TABLET | ORAL | Status: DC

## 2013-11-04 NOTE — ED Provider Notes (Signed)
CSN: [id number]     Arrival date & time 11/04/13  0253 History   First MD Initiated Contact with Patient 11/04/13 [phone]     Chief Complaint  Patient presents with  . Flank Pain     (Consider location/radiation/quality/duration/timing/severity/associated sxs/prior Treatment) HPI 63 year old female presents with 2-3 days of right flank/lower back pain. She states his been intermittent but seems to be worsening. She states it feels like scissors and like a sharp pain. Last night it seemed to be radiating into her right lower abdomen that she is concerned she has appendicitis. At this time her abdomen does not hurt in her back pain is significantly improved. Does seem to be coming and going. She does not remember a specific injury but states she woke up one day and had pain in her low back. Pain is worse with lying flat. Denies pain with twisting but she's been avoiding this in case it were to cause pain. Denies any dysuria or hematuria. No leg pain or bowel or bladder incontinence. Right now she rates the pain as a 5/10 and states is very mild and does not want any pain medicine.  Past Medical History  Diagnosis Date  . GERD (gastroesophageal reflux disease)   . Prediabetes   . Depression   . Arthritis     cervical and lumbar  . Vitamin D deficiency   . Hyperlipidemia    Past Surgical History  Procedure Laterality Date  . Cardiac catheterization  2011    normal (Dr. Terrence Dupont)   History reviewed. No pertinent family history. History  Substance Use Topics  . Smoking status: Never Smoker   . Smokeless tobacco: Never Used  . Alcohol Use: No   OB History   Grav Para Term Preterm Abortions TAB SAB Ect Mult Living                 Review of Systems  Constitutional: Negative for fever.  Gastrointestinal: Positive for nausea and abdominal pain. Negative for vomiting.  Genitourinary: Positive for flank pain. Negative for dysuria.  Musculoskeletal: Positive for back pain.  Neurological:  Negative for weakness and numbness.  All other systems reviewed and are negative.     Allergies  Bactrim  Home Medications   Prior to Admission medications   Medication Sig Start Date End Date Taking? Authorizing Provider  aspirin 81 MG tablet Take 81 mg by mouth daily.   Yes Historical Provider, MD  Cholecalciferol (D-3-5) 5000 UNITS capsule Take 5,000 Units by mouth daily.   Yes Historical Provider, MD  cycloSPORINE (RESTASIS) 0.05 % ophthalmic emulsion Place 1 drop into both eyes 2 (two) times daily.   Yes Historical Provider, MD  Flaxseed, Linseed, (FLAXSEED OIL PO) Take by mouth daily.   Yes Historical Provider, MD  IRON PO Take by mouth daily.   Yes Historical Provider, MD  Lutein 20 MG TABS Take by mouth.   Yes Historical Provider, MD  magnesium chloride (SLOW-MAG) 64 MG TBEC Take 1 tablet by mouth daily.   Yes Historical Provider, MD  Multiple Vitamins-Minerals (WOMENS MULTI VITAMIN & MINERAL PO) Take 1 tablet by mouth daily.   Yes Historical Provider, MD  NEXIUM 40 MG capsule take 1 capsule by mouth once daily 04/22/13  Yes Vicie Mutters, PA-C  Omega-3 Fatty Acids (FISH OIL PO) Take by mouth daily.   Yes Historical Provider, MD   BP 93/68  Pulse 56  Temp(Src) 97.7 F (36.5 C) (Oral)  Resp 18  SpO2 99% Physical Exam  Nursing  note and vitals reviewed. Constitutional: She is oriented to person, place, and time. She appears well-developed and well-nourished.  HENT:  Head: Normocephalic and atraumatic.  Right Ear: External ear normal.  Left Ear: External ear normal.  Nose: Nose normal.  Eyes: Right eye exhibits no discharge. Left eye exhibits no discharge.  Cardiovascular: Normal rate, regular rhythm and normal heart sounds.   Pulmonary/Chest: Effort normal and breath sounds normal.  Abdominal: Soft. There is no tenderness. There is no CVA tenderness.  Musculoskeletal:       Lumbar back: She exhibits tenderness.       Back:  Neurological: She is alert and oriented to  person, place, and time.  Skin: Skin is warm and dry.    ED Course  Procedures (including critical care time) Labs Review Labs Reviewed  CBC WITH DIFFERENTIAL - Abnormal; Notable for the following:    WBC 3.3 (*)    Neutrophils Relative % 41 (*)    Neutro Abs 1.4 (*)    Lymphocytes Relative 48 (*)    All other components within normal limits  URINALYSIS, ROUTINE W REFLEX MICROSCOPIC  BASIC METABOLIC PANEL    Imaging Review Ct Abdomen Pelvis Wo Contrast  11/04/2013   CLINICAL DATA:  Bilateral flank pain for the past 3 days. Pain now radiating into the right lower quadrant.  EXAM: CT ABDOMEN AND PELVIS WITHOUT CONTRAST  TECHNIQUE: Multidetector CT imaging of the abdomen and pelvis was performed following the standard protocol without IV contrast.  COMPARISON:  No priors.  FINDINGS: Lung Bases: Unremarkable.  Abdomen/Pelvis: There are no abnormal calcifications within the collecting system of either kidney, along the course of either ureter, or within the lumen of the urinary bladder. No hydroureteronephrosis or perinephric stranding to suggest urinary tract obstruction at this time. The unenhanced appearance of the kidneys is unremarkable bilaterally.  The unenhanced appearance of the liver, gallbladder, pancreas, spleen and bilateral adrenal glands is unremarkable. No significant volume of ascites. No pneumoperitoneum. No pathologic distention of small bowel. Normal appendix. No definite lymphadenopathy identified within the abdomen or pelvis on today's non contrast CT examination. Uterus and ovaries are unremarkable. Urinary bladder is normal in appearance.  Musculoskeletal: There are no aggressive appearing lytic or blastic lesions noted in the visualized portions of the skeleton. 7 mm of anterolisthesis of L4 upon L5 (unchanged).  IMPRESSION: 1. No acute findings in the abdomen or pelvis to account for the patient's symptoms. Specifically, no abnormal urinary tract calculi or findings of  urinary tract obstruction. 2. Normal appendix. 3. Grade 1 anterolisthesis of L4 upon L5 is unchanged.   Electronically Signed   By: Vinnie Langton M.D.   On: 11/04/2013 06:38     EKG Interpretation None      MDM   Final diagnoses:  Back pain    Patient with low back pain and now pain in groin and in upper leg/buttock. No evidence of acute intra-abd source, including no evidence of ureteral stone. Given her lower back tenderness and pain with movement, likely a MSK etiology. Will treat symptomatically and refer to PCP.    Ephraim Hamburger, MD 11/04/13 [phone]

## 2013-11-04 NOTE — Discharge Instructions (Signed)
Back Pain, Adult Low back pain is very common. About 1 in 5 people have back pain.The cause of low back pain is rarely dangerous. The pain often gets better over time.About half of people with a sudden onset of back pain feel better in just 2 weeks. About 8 in 10 people feel better by 6 weeks.  CAUSES Some common causes of back pain include:  Strain of the muscles or ligaments supporting the spine.  Wear and tear (degeneration) of the spinal discs.  Arthritis.  Direct injury to the back. DIAGNOSIS Most of the time, the direct cause of low back pain is not known.However, back pain can be treated effectively even when the exact cause of the pain is unknown.Answering your caregiver's questions about your overall health and symptoms is one of the most accurate ways to make sure the cause of your pain is not dangerous. If your caregiver needs more information, he or she may order lab work or imaging tests (X-rays or MRIs).However, even if imaging tests show changes in your back, this usually does not require surgery. HOME CARE INSTRUCTIONS For many people, back pain returns.Since low back pain is rarely dangerous, it is often a condition that people can learn to manageon their own.   Remain active. It is stressful on the back to sit or stand in one place. Do not sit, drive, or stand in one place for more than 30 minutes at a time. Take short walks on level surfaces as soon as pain allows.Try to increase the length of time you walk each day.  Do not stay in bed.Resting more than 1 or 2 days can delay your recovery.  Do not avoid exercise or work.Your body is made to move.It is not dangerous to be active, even though your back may hurt.Your back will likely heal faster if you return to being active before your pain is gone.  Pay attention to your body when you bend and lift. Many people have less discomfortwhen lifting if they bend their knees, keep the load close to their bodies,and  avoid twisting. Often, the most comfortable positions are those that put less stress on your recovering back.  Find a comfortable position to sleep. Use a firm mattress and lie on your side with your knees slightly bent. If you lie on your back, put a pillow under your knees.  Only take over-the-counter or prescription medicines as directed by your caregiver. Over-the-counter medicines to reduce pain and inflammation are often the most helpful.Your caregiver may prescribe muscle relaxant drugs.These medicines help dull your pain so you can more quickly return to your normal activities and healthy exercise.  Put ice on the injured area.  Put ice in a plastic bag.  Place a towel between your skin and the bag.  Leave the ice on for 15-20 minutes, 03-04 times a day for the first 2 to 3 days. After that, ice and heat may be alternated to reduce pain and spasms.  Ask your caregiver about trying back exercises and gentle massage. This may be of some benefit.  Avoid feeling anxious or stressed.Stress increases muscle tension and can worsen back pain.It is important to recognize when you are anxious or stressed and learn ways to manage it.Exercise is a great option. SEEK MEDICAL CARE IF:  You have pain that is not relieved with rest or medicine.  You have pain that does not improve in 1 week.  You have new symptoms.  You are generally not feeling well. SEEK   IMMEDIATE MEDICAL CARE IF:   You have pain that radiates from your back into your legs.  You develop new bowel or bladder control problems.  You have unusual weakness or numbness in your arms or legs.  You develop nausea or vomiting.  You develop abdominal pain.  You feel faint. Document Released: 06/02/2005 Document Revised: 12/02/2011 Document Reviewed: 10/21/2010 [employer] Patient Information 2014 [employer].  

## 2013-11-04 NOTE — ED Notes (Signed)
Pt reports that she was out of work on Tuesday d/t bilateral flank pain "scissor like" increasing yesterday. Pt states that the pain radiates to her R lower abdomen since last night. Pt denies any known injuries. Pt reports frequent urination, has increased her fluid intake. Pt a&o x4, ambulatory to triage, skin warm and dry.

## 2013-11-18 ENCOUNTER — Encounter: Payer: Self-pay | Admitting: Physician Assistant

## 2013-12-06 ENCOUNTER — Ambulatory Visit (INDEPENDENT_AMBULATORY_CARE_PROVIDER_SITE_OTHER): Payer: 59 | Admitting: Physician Assistant

## 2013-12-06 ENCOUNTER — Encounter: Payer: Self-pay | Admitting: Physician Assistant

## 2013-12-06 VITALS — BP 86/52 | HR 68 | Temp 98.3°F | Resp 16 | Wt 140.0 lb

## 2013-12-06 DIAGNOSIS — N3000 Acute cystitis without hematuria: Secondary | ICD-10-CM

## 2013-12-06 DIAGNOSIS — R7309 Other abnormal glucose: Secondary | ICD-10-CM

## 2013-12-06 DIAGNOSIS — M5412 Radiculopathy, cervical region: Secondary | ICD-10-CM

## 2013-12-06 DIAGNOSIS — I959 Hypotension, unspecified: Secondary | ICD-10-CM

## 2013-12-06 DIAGNOSIS — IMO0001 Reserved for inherently not codable concepts without codable children: Secondary | ICD-10-CM

## 2013-12-06 DIAGNOSIS — E785 Hyperlipidemia, unspecified: Secondary | ICD-10-CM

## 2013-12-06 DIAGNOSIS — R7303 Prediabetes: Secondary | ICD-10-CM

## 2013-12-06 DIAGNOSIS — Z0000 Encounter for general adult medical examination without abnormal findings: Secondary | ICD-10-CM

## 2013-12-06 LAB — HEMOGLOBIN A1C
Hgb A1c MFr Bld: 6.1 % — ABNORMAL HIGH (ref <5.7)
Mean Plasma Glucose: 128 mg/dL — ABNORMAL HIGH (ref <117)

## 2013-12-06 MED ORDER — ESOMEPRAZOLE MAGNESIUM 40 MG PO CPDR: 40.0000 mg | DELAYED_RELEASE_CAPSULE | Freq: Every day | ORAL | Status: DC

## 2013-12-06 NOTE — Patient Instructions (Signed)
Hypotension As your heart beats, it forces blood through your arteries. This force is your blood pressure. If your blood pressure is too low for you to go about your normal activities or to support the organs of your body, you have hypotension. Hypotension is also referred to as low blood pressure. When your blood pressure becomes too low, you may not get enough blood to your brain. As a result, you may feel weak, feel lightheaded, or develop a rapid heart rate. In a more severe case, you may faint. CAUSES Various conditions can cause hypotension. These include:  Blood loss.  Dehydration.  Heart or endocrine problems.  Pregnancy.  Severe infection.  Not having a well-balanced diet filled with needed nutrients.  Severe allergic reactions (anaphylaxis). Some medicines, such as blood pressure medicine or water pills (diuretics), may lower your blood pressure below normal. Sometimes taking too much medicine or taking medicine not as directed can cause hypotension. TREATMENT  Hospitalization is sometimes required for hypotension if fluid or blood replacement is needed, if time is needed for medicines to wear off, or if further monitoring is needed. Treatment might include changing your diet, changing your medicines (including medicines aimed at raising your blood pressure), and use of support stockings. HOME CARE INSTRUCTIONS   Drink enough fluids to keep your urine clear or pale yellow.  Take your medicines as directed by your health care provider.  Get up slowly from reclining or sitting positions. This gives your blood pressure a chance to adjust.  Wear support stockings as directed by your health care provider.  Maintain a healthy diet by including nutritious food, such as fruits, vegetables, nuts, whole grains, and lean meats. SEEK MEDICAL CARE IF:  You have vomiting or diarrhea.  You have a fever for more than 2-3 days.  You feel more thirsty than usual.  You feel weak and  tired. SEEK IMMEDIATE MEDICAL CARE IF:   You have chest pain or a fast or irregular heartbeat.  You have a loss of feeling in some part of your body, or you lose movement in your arms or legs.  You have trouble speaking.  You become sweaty or feel lightheaded.  You faint. MAKE SURE YOU:   Understand these instructions.  Will watch your condition.  Will get help right away if you are not doing well or get worse. Document Released: 06/02/2005 Document Revised: 03/23/2013 Document Reviewed: 12/03/2012 [facility] Patient Information 2015 [place], [place]. This information is not intended to replace advice given to you by your health care provider. Make sure you discuss any questions you have with your health care provider.  Cervical Radiculopathy Cervical radiculopathy means a nerve in the neck is pinched or bruised. This can cause pain or loss of feeling (numbness) that runs from your neck to your arm and fingers. HOME CARE   Put ice on the injured or painful area.  Put ice in a plastic bag.  Place a towel between your skin and the bag.  Leave the ice on for 15-20 minutes, 03-04 times a day, or as told by your doctor.  If ice does not help, you can try using heat. Take a warm shower or bath, or use a hot water bottle as told by your doctor.  You may try a gentle neck and shoulder massage.  Use a flat pillow when you sleep.  Only take medicines as told by your doctor.  Keep all physical therapy visits as told by your doctor.  If you are given  a soft collar, wear it as told by your doctor. GET HELP RIGHT AWAY IF:   Your pain gets worse and is not controlled with medicine.  You lose feeling or feel weak in your hand, arm, face, or leg.  You have a fever or stiff neck.  You cannot control when you poop or pee (incontinence).  You have trouble with walking, balance, or speaking. MAKE SURE YOU:   Understand these instructions.  Will watch your condition.  Will get  help right away if you are not doing well or get worse. Document Released: 05/22/2011 Document Revised: 08/25/2011 Document Reviewed: 05/22/2011 [facility] Patient Information 2015 [place], [place]. This information is not intended to replace advice given to you by your health care provider. Make sure you discuss any questions you have with your health care provider.  Secondary Impingement Syndrome with Rehab  The rotator cuff consists of four muscles that are responsible for much of the function of the shoulder joint. The subacromial bursa is a fluid filled sac that reduces friction between part of the shoulder (acromion) and the rotator cuff tendons. Secondary impingement syndrome is a condition in which the subacromial bursa or tendons of the rotator cuff become inflamed. Inflammation of either of these tendons causes pain and weakness in the shoulder.  SYMPTOMS   Pain, weakness, and/or loss of motion in the shoulder.  Pain that worsens with shoulder function.  Tenderness, swelling, warmth, or redness may occasionally be present over the outer aspect of the shoulder.  A crackling sound (crepitation) when the shoulder is moved. CAUSES  Secondary impingement syndrome is caused by inflammation of the rotator cuff tendons or subacromial bursa. Common mechanisms of injury include:  Repetitive and/or strenuous shoulder movements, especially those above shoulder height.  Direct trauma to the shoulder (uncommon). RISK INCREASES WITH:  Contact sports (football, wrestling, or boxing).  Activities involving arm movement above shoulder height (baseball, volleyball, or racquet sports).  Information systems manager.  Heavy labor.  Previous shoulder injury.  Poor strength and flexibility.  Loose shoulder ligaments. PREVENTION  Warm up and stretch properly before activity.  Allow for adequate recovery between workouts.  Maintain physical fitness:  Strength, flexibility, and  endurance.  Cardiovascular fitness.  Learn and use proper technique. When possible, have coach correct improper technique. PROGNOSIS  If treated properly, then secondary shoulder impingement normally resolves with conservative treatment. RELATED COMPLICATIONS   Prolonged healing time, if improperly treated or re-injured.  Recurrent symptoms that result in a chronic problem.  Other shoulder conditions, such as frozen shoulder or a rotator cuff tear. TREATMENT Treatment initially involves the use of ice and medication to help reduce pain and inflammation. The use of strengthening and stretching exercises may help reduce pain with activity, especially those exercises focused on stabilizing the shoulder blade (scapula). These exercises may be performed at home or with referral to a therapist. If symptoms persist for greater than 6 months despite non-surgical (conservative) treatment, then surgery may be recommended.  MEDICATION   If pain medication is necessary, then nonsteroidal anti-inflammatory medications, such as aspirin and ibuprofen, or other minor pain relievers, such as acetaminophen, are often recommended.  Do not take pain medication for 7 days before surgery.  Prescription pain relievers may be given if deemed necessary by your caregiver. Use only as directed and only as much as you need. COLD THERAPY  Cold treatment (icing) relieves pain and reduces inflammation. Cold treatment should be applied for 10 to 15 minutes every 2 to 3 hours  for inflammation and pain and immediately after any activity that aggravates your symptoms. Use ice packs or massage the area with a piece of ice (ice massage). SEEK MEDICAL CARE IF:  Treatment seems to offer no benefit, or the condition worsens.  Any medications produce adverse side effects. EXERCISES RANGE OF MOTION (ROM) AND STRETCHING EXERCISES - SECONDARY IMPINGEMENT SYNDROME These exercises may help you when beginning to rehabilitate your  injury. Your symptoms may resolve with or without further involvement from your physician, physical therapist or athletic trainer. While completing these exercises, remember:   Restoring tissue flexibility helps normal motion to return to the joints. This allows healthier, less painful movement and activity.  An effective stretch should be held for at least 30 seconds.  A stretch should never be painful. You should only feel a gentle lengthening or release in the stretched tissue. ROM - Internal Rotation, Supine  Lie on your back on a firm surface. Place your right / left elbow about 60 degrees away from your side. Elevate your elbow with a folded towel so that the elbow and shoulder are the same height.  Using a broomstick or cane and your strong arm, pull your right / left hand toward your body until you feel a gentle stretch, but no increase in your shoulder pain. Keep your shoulder and elbow in place throughout the exercise.  Hold __________. Slowly return to the starting position. Repeat __________ times. Complete this exercise __________ times per day. STRETCH - Internal Rotation  Place your right / left hand behind your back, palm-up.  Throw a towel or belt over your opposite shoulder. Grasp the towel/belt with your right / left hand.  While keeping an upright posture, gently pull up on the towel/belt until you feel a stretch in the front of your right / left shoulder.  Avoid shrugging your right / left shoulder as your arm rises by keeping your shoulder blade tucked down and toward your mid-back spine.  Hold __________. Release the stretch by lowering your opposite hand. Repeat __________ times. Complete this exercise __________ times per day.  ROM - Internal Rotation  Using underhand grips, grasp a stick behind your back with both hands.  While standing upright with good posture, slide the stick up your back until you feel a mild stretch in the front of your shoulder.  Hold  __________ seconds. Slowly return to your starting position.  Grasp a stick behind your back with both hands. Repeat __________ times. Complete this exercise __________ times per day.  STRETCH - External Rotation and Abduction  Stagger your stance through a doorframe. It does not matter which foot is forward.  As instructed by your physician, physical therapist or athletic trainer, place your hands:  And forearms above your head and on the door frame.  And forearms at head-height and on the door frame.  At elbow-height and on the door frame.  Keeping your head and chest upright and your stomach muscles tight to prevent over-extending your low-back, slowly shift your weight onto your front foot until you feel a stretch across your chest and/or in the front of your shoulders.  Hold __________ seconds. Shift your weight to your back foot to release the stretch. Repeat __________ times. Complete this stretch __________ times per day.  STRETCH - Posterior Shoulder Capsule  Stand or sit with good posture. Grasp your right / left elbow and draw it across your chest. Keep it the same height as your shoulder.  Pull your elbow so  your upper arm comes in closer to your chest. Pull until you feel a gentle stretch in the back of your shoulder.  Hold __________. Repeat __________ times. Complete this exercise __________ times per day. STRENGTHENING EXERCISES - Secondary Impingement Syndrome  These exercises may help you when beginning to rehabilitate your injury. They may resolve your symptoms with or without further involvement from your physician, physical therapist or athletic trainer. While completing these exercises, remember:   Muscles can gain both the endurance and the strength needed for everyday activities through controlled exercises.  Complete these exercises as instructed by your physician, physical therapist or athletic trainer. Progress with the resistance and repetition exercises  only as your caregiver advises.  You may experience muscle soreness or fatigue, but the pain or discomfort you are trying to eliminate should never worsen during these exercises. If this pain does worsen, stop and make certain you are following the directions exactly. If the pain is still present after adjustments, discontinue the exercise until you can discuss the trouble with your clinician.  During your recovery, avoid activity or exercises which involve actions that place your right / left hand or elbow above your head or behind your back or head. These positions stress the tissues which are trying to heal. STRENGTH - Scapular Depression and Adduction  With good posture, sit on a firm chair. Support your arms in front of you with pillows, arm rests or a table top. Have your elbows in line with the sides of your body.  Gently draw your shoulder blades down and toward your mid-back spine. Gradually increase the tension without tensing the muscles along the top of your shoulders and the back of your neck.  Hold for __________ seconds. Slowly release the tension and relax your muscles completely before completing the next repetition.  After you have practiced this exercise, remove the arm support and complete it in standing as well as sitting. Repeat __________ times. Complete this exercise __________ times per day.  STRENGTH - Shoulder Abductors, Isometric  With good posture, stand or sit about 4-6 inches from a wall with your right / left side facing the wall.  Bend your right / left elbow. Gently press your right / left elbow into the wall. Increase the pressure gradually until you are pressing as hard as you can without shrugging your shoulder or increasing any shoulder discomfort.  Hold __________ seconds.  Release the tension slowly. Relax your shoulder muscles completely before you do the next repetition. Repeat __________ times. Complete this exercise __________ times per day.   STRENGTH - External Rotators, Isometric  Keep your right / left elbow at your side and bend it 90 degrees.  Step into a door frame so that the outside of your right / left wrist can press against the door frame without your upper arm leaving your side.  Gently press your right / left wrist into the door frame as if you were trying to swing the back of your hand away from your abdomen. Gradually increase the tension until you are pressing as hard as you can without shrugging your shoulder or increasing any shoulder discomfort.  Hold __________ seconds.  Release the tension slowly. Relax your shoulder muscles completely before you do the next repetition. Repeat __________ times. Complete this exercise __________ times per day.  STRENGTH - Internal Rotators, Isometric  Keep your right / left elbow at your side and bend it 90 degrees.  Step into a door frame so that the inside  of your right / left wrist can press against the door frame without your upper arm leaving your side.  Gently press your right / left wrist into the door frame as if you were trying to draw the palm of your hand to your abdomen. Gradually increase the tension until you are pressing as hard as you can without shrugging your shoulder or increasing any shoulder discomfort.  Hold __________ seconds.  Release the tension slowly. Relax your shoulder muscles completely before you do the next repetition. Repeat __________ times. Complete this exercise __________ times per day.  STRENGTH - Scapular Protractors, Standing  Stand arms-length away from a wall. Place your hands on the wall, keeping your elbows straight.  Begin by dropping your shoulder blades down and toward your mid-back spine.  To strengthen your protractors, keep your shoulder blades down, but slide them forward on your rib cage. It will feel as if you are lifting the back of your rib cage away from the wall. This is a subtle motion and can be challenging to  complete. Ask your clinician for further instruction if you are not sure you are doing the exercise correctly.  Hold for __________ seconds. Slowly return to the starting position, resting the muscles completely before completing the next repetition. Repeat __________ times. Complete this exercise __________ times per day. STRENGTH - Scapular Protractors, Supine  Lie on your back on a firm surface. Extend your right / left arm straight into the air while holding a __________ weight in your hand.  Keeping your head and back in place, lift your shoulder off the floor.  Hold __________ seconds. Slowly return to the starting position and allow your muscles to relax completely before completing the next repetition. Repeat __________ times. Complete this exercise __________ times per day. STRENGTH - Scapular Protractors, Quadruped  Get onto your hands and knees with your shoulders directly over your hands (or as close as you comfortably can be).  Keeping your elbows locked, lift the back of your rib cage up into your shoulder blades so your mid-back rounds-out. Keep your neck muscles relaxed.  Hold this position for __________ seconds. Slowly return to the starting position and allow your muscles to relax completely before completing the next repetition. Repeat __________ times. Complete this exercise __________ times per day.  STRENGTH - Scapular Depressors  Find a sturdy chair without wheels, such as a from a dining room table.  Keeping your feet on the floor, lift your bottom from the seat and lock your elbows.  Keeping your elbows straight, allow gravity to pull your body weight down. Your shoulders will rise toward your ears.  Raise your body against gravity by drawing your shoulder blades down your back, shortening the distance between your shoulders and ears. Although your feet should always maintain contact with the floor, your feet should progressively support less body weight as you  get stronger.  Hold __________ seconds. In a controlled and slow manner, lower your body weight to begin the next repetition. Repeat __________ times. Complete this exercise __________ times per day.  STRENGTH - Scapular Retractors  Secure a rubber exercise band/tubing so that it is at the height of your shoulders when you are either standing or sitting on a firm arm-less chair.  With a palm-down grip, grasp an end of the band/tubing in each hand. Straighten your elbows and lift your hands straight in front of you at shoulder height. Step back away from the secured end of band/tubing until it becomes  tense.  Squeezing your shoulder blades together, draw your elbows back as you bend them. Keep your upper arm lifted away from your body throughout the exercise.  Hold __________ seconds. Slowly ease the tension on the band/tubing as you reverse the directions and return to the starting position. Repeat __________ times. Complete this exercise __________ times per day. STRENGTH - Shoulder Extensors  Secure a rubber exercise band/tubing so that it is at the height of your shoulders when you are either standing or sitting on a firm arm-less chair.  With a thumbs-up grip, grasp an end of the band/tubing in each hand. Straighten your elbows and lift your hands straight in front of you at shoulder height. Step back away from the secured end of band/tubing until it becomes tense.  Squeezing your shoulder blades together, pull your hands down to the sides of your thighs. Do not allow your hands to go behind you.  Hold for __________ seconds. Slowly ease the tension on the band/tubing as you reverse the directions and return to the starting position. Repeat __________ times. Complete this exercise __________ times per day.  STRENGTH - Scapular Retractors and External Rotators  Secure a rubber exercise band/tubing so that it is at the height of your shoulders when you are either standing or sitting on a  firm arm-less chair.  With a palm-down grip, grasp an end of the band/tubing in each hand. Bend your elbows 90 degrees and lift your elbows to shoulder height at your sides. Step back away from the secured end of band/tubing until it becomes tense.  Squeezing your shoulder blades together, rotate your shoulder so that your upper arm and elbow remain stationary, but your fists travel upward to head-height.  Hold __________ for seconds. Slowly ease the tension on the band/tubing as you reverse the directions and return to the starting position. Repeat __________ times. Complete this exercise __________ times per day.  STRENGTH - Scapular Retractors and External Rotators, Rowing  Secure a rubber exercise band/tubing so that it is at the height of your shoulders when you are either standing or sitting on a firm arm-less chair.  With a palm-down grip, grasp an end of the band/tubing in each hand. Straighten your elbows and lift your hands straight in front of you at shoulder height. Step back away from the secured end of band/tubing until it becomes tense.  Step 1: Squeeze your shoulder blades together. Bending your elbows, draw your hands to your chest as if you are rowing a boat. At the end of this motion, your hands and elbow should be at shoulder-height and your elbows should be out to your sides.  Step 2: Rotate your shoulder to raise your hands above your head. Your forearms should be vertical and your upper-arms should be horizontal.  Hold for __________ seconds. Slowly ease the tension on the band/tubing as you reverse the directions and return to the starting position. Repeat __________ times. Complete this exercise __________ times per day.  STRENGTH - Internal Rotators  Secure a rubber exercise band/tubing to a fixed object so that it is at the same height as your right / left elbow when you are standing or sitting on a firm surface.  Stand or sit so that the secured exercise  band/tubing is at your right / left side.  Bend your elbow 90 degrees. Place a folded towel or small pillow under your right / left arm so that your elbow is a few inches away from your side.  Keeping the tension on the exercise band/tubing, pull it across your body toward your abdomen. Be sure to keep your body steady so that the movement is only coming from your shoulder rotating.  Hold __________ seconds. Release the tension in a controlled manner as you return to the starting position. Repeat __________ times. Complete this exercise __________ times per day.  STRENGTH - External Rotators  Secure a rubber exercise band/tubing to a fixed object so that it is at the same height as your right / left elbow when you are standing or sitting on a firm surface.  Stand or sit so that the secured exercise band/tubing is at your side that is not injured.  Bend your elbow 90 degrees. Place a folded towel or small pillow under your right / left arm so that your elbow is a few inches away from your side.  Keeping the tension on the exercise band/tubing, pull it away from your body, as if pivoting on your elbow. Be sure to keep your body steady so that the movement is only coming from your shoulder rotating.  Hold __________ seconds. Release the tension in a controlled manner as you return to the starting position. Repeat __________ times. Complete this exercise __________ times per day.  STRENGTH - Supraspinatus  Stand or sit with good posture. Grasp a __________ lb weight or an exercise band/tubing so that your hand is "thumbs-up," like when you shake hands.  Slowly lift your right / left hand from your thigh into the air, traveling about 30 degrees from straight out at your side. Lift your hand to shoulder height or as far as you can without increasing any shoulder pain. Initially, many people do not lift their hands above shoulder height.  Avoid shrugging your right / left shoulder as your arm  rises by keeping your shoulder blade tucked down and toward your mid-back spine.  Hold for __________ seconds. Control the descent of your hand as you slowly return to your starting position. Repeat __________ times. Complete this exercise __________ times per day.  STRENGTH - Shoulder Abductors   Stand or sit with good posture. Place your right / left arm at your side.  With a thumbs-up grasp, hold a __________ weight or a secure rubber exercise band/tubing in your right / left hand. Slowly lift your arm from your side as far as you can without reproducing any of your shoulder pain. Do not lift your hand above shoulder-height unless you have been instructed to do so by your physician, physical therapist or athletic trainer. If this motion causes pain or increased discomfort, discuss this with your physician, physical therapist, or athletic trainer.  Avoid shrugging your right / left shoulder as your arm rises by keeping your shoulder blade tucked down and toward your mid-back spine.  Hold __________ seconds. Release the tension in a controlled manner as you return to the starting position. Repeat __________ times. Complete this exercise __________ times per day. Document Released: 06/02/2005 Document Revised: 08/25/2011 Document Reviewed: 09/14/2008 [facility] Patient Information 2015 [place], [place]. This information is not intended to replace advice given to you by your health care provider. Make sure you discuss any questions you have with your health care provider.

## 2013-12-06 NOTE — Progress Notes (Signed)
Complete Physical  Assessment and Plan: GERD (gastroesophageal reflux disease)- continue nexium  Prediabetes- Discussed general issues about diabetes pathophysiology and management., Educational material distributed., Suggested low cholesterol diet., Encouraged aerobic exercise., Discussed foot care., Reminded to get yearly retinal exam.  Depression- remission  Arthritis cervical and lumbar- exercises given  Vitamin D deficiency- check levels  Hyperlipidemia- -continue medications, check lipids, decrease fatty foods, increase activity.   Left shoulder impingement versus cervical DDD- will send to ortho Hypotension with fatigue and dizziness, history of anemia- will check for dehydration, anemia, normal colon/EGD in 2014. Will check ESR, aldosterone, cortisol and urine as well as well, in the mean time increase fluids, add gatorade Urinary frequency- check for infection MAMMOGRAM DUE  Discussed med's effects and SE's. Screening labs and tests as requested with regular follow-up as recommended.  HPI 63 y.o. female  presents for a complete physical. Her blood pressure has been controlled at home, today their BP is BP: 86/52 mmHg She states that for the last 3-4 weeks she has felt very weak and decreased motivation, she felt her "legs could not take her further" She has been having headaches, some dizziness with standing, and loose stools. She is not on a BP med and her BP is running very low.  She does not workout. She denies chest pain, shortness of breath, dizziness.  She is not on cholesterol medication and denies myalgias. Her cholesterol is not at goal. The cholesterol last visit was:   Lab Results  Component Value Date   CHOL 211* 06/28/2013   HDL 74 06/28/2013   LDLCALC 126* 06/28/2013   TRIG 57 06/28/2013   CHOLHDL 2.9 06/28/2013   She has been working on diet and exercise for prediabetes, and denies paresthesia of the feet, polydipsia and polyuria. Last A1C in the office was:  Lab  Results  Component Value Date   HGBA1C 5.8* 06/28/2013   Patient is on Vitamin D supplement.   She has had anemia in the past but does not take iron pills due to constipation.  Wt Readings from Last 3 Encounters:  12/06/13 140 lb (63.504 kg)  06/28/13 143 lb (64.864 kg)   Current Medications:  Current Outpatient Prescriptions on File Prior to Visit  Medication Sig Dispense Refill  . aspirin 81 MG tablet Take 81 mg by mouth daily.      . Cholecalciferol (D-3-5) 5000 UNITS capsule Take 5,000 Units by mouth daily.      . cycloSPORINE (RESTASIS) 0.05 % ophthalmic emulsion Place 1 drop into both eyes 2 (two) times daily.      . Flaxseed, Linseed, (FLAXSEED OIL PO) Take by mouth daily.      . IRON PO Take by mouth daily.      . Lutein 20 MG TABS Take by mouth.      . magnesium chloride (SLOW-MAG) 64 MG TBEC Take 1 tablet by mouth daily.      . Multiple Vitamins-Minerals (WOMENS MULTI VITAMIN & MINERAL PO) Take 1 tablet by mouth daily.      . Omega-3 Fatty Acids (FISH OIL PO) Take by mouth daily.      . predniSONE (DELTASONE) 10 MG tablet 1 po TID x 3 days, 1 PO BID x 3 days, 1 po QD x 5 days  20 tablet  0   No current facility-administered medications on file prior to visit.   Health Maintenance:   Immunization History  Administered Date(s) Administered  . Td 05/18/2007  . Zoster 05/17/2006   Tetanus:  2008 Pneumovax:  Flu vaccine: Zostavax:2007 Pap: 2014 due 3 years= 2017 MGM: 07/2012 DEXA: 2004 Colonoscopy: 2014 normal EGD: 2014 Normal MRI head 08/2012 Normal cath/cardiolite 2014 DEE: Dr. Herbert Deaner 12/01/2013  Allergies:  Allergies  Allergen Reactions  . Bactrim [Sulfamethoxazole-Tmp Ds] Other (See Comments)    Patient preference to take medication without sulfa   Medical History:  Past Medical History  Diagnosis Date  . GERD (gastroesophageal reflux disease)   . Prediabetes   . Depression   . Arthritis     cervical and lumbar  . Vitamin D deficiency   .  Hyperlipidemia    Surgical History:  Past Surgical History  Procedure Laterality Date  . Cardiac catheterization  2011    normal (Dr. Terrence Dupont)   Family History: No family history on file. Social History:  History  Substance Use Topics  . Smoking status: Never Smoker   . Smokeless tobacco: Never Used  . Alcohol Use: No    Review of Systems: _0  = complains of  _1  = denies  General: Fatigue Valu.Nieves ] Fever _2  Chills _3  Weakness _4   Insomnia _5 Weight change _6  Night sweats _7   Change in appetite _8  Eyes: Redness _9  Blurred vision _10  Diplopia _11  Discharge _12   ENT: Congestion _13  Sinus Pain [ X] Post Nasal Drip _14  Sore Throat _15  Earache _16  hearing loss _17  Tinnitus _18  Snoring _19   Cardiac: Chest pain/pressure _20  SOB _21  Orthopnea _22   Palpitations _23   Paroxysmal nocturnal dyspnea_24  Claudication _25  Edema _26   Pulmonary: Cough _27  Wheezing_28   SOB _29   Pleurisy _30   GI: Nausea _31  Vomiting_32  Dysphagia_33  Heartburn_34  Abdominal pain LOWER ABDOMINAL CRAMPING [x ] Constipation _35 ; Diarrhea _36  BRBPR _37  Melena_38  Bloating _39  Hemorrhoids _40   GU: Hematuria_41  Dysuria _42  Nocturia_43  Urgency _44   Hesitancy _45  Discharge _46  Frequency _47   Breast:  Breast lumps _48   nipple discharge _49    Neuro: Headaches[X ] Vertigo_50  Paresthesias LEFT ARM, WORSE WITH LYING ON IT Valu.Nieves ] Spasm _51  Speech changes _52  Incoordination _53   Ortho: Arthritis _54  Joint pain _55  Muscle pain _56  Joint swelling _57  Back Pain _58  Skin:  Rash _59   Pruritis _60  Change in skin lesion _61   Psych: Depression_62  Anxiety_63  Confusion _64  Memory loss _65   Heme/Lypmh: Bleeding _66  Bruising _67  Enlarged lymph nodes _68   Endocrine: Visual blurring _69  Paresthesia _70  Polyuria _71  Polydypsea _72    Heat/cold intolerance _73  Hypoglycemia _74   Physical Exam: Estimated body mass index is 24.02 kg/(m^2) as calculated from the following:   Height as of 06/28/13: _75  (1.626 m).   Weight as of this encounter: 140 lb  (63.504 kg). BP 86/52  Pulse 68  Temp(Src) 98.3 F (36.8 C)  Resp 16  Wt 140 lb (63.504 kg) General Appearance: Well nourished, in no apparent distress. Eyes: PERRLA, EOMs, conjunctiva no swelling or erythema, normal fundi and vessels. Sinuses: No Frontal/maxillary tenderness ENT/Mouth: Ext aud canals clear, normal light reflex with TMs without erythema, bulging.  Good dentition. No erythema, swelling, or exudate on post pharynx. Tonsils not swollen or erythematous. Hearing normal.  Neck: Supple, thyroid normal. No bruits Respiratory: Respiratory effort  normal, BS equal bilaterally without rales, rhonchi, wheezing or stridor. Cardio: RRR without murmurs, rubs or gallops. Brisk peripheral pulses without edema.  Chest: symmetric, with normal excursions and percussion. Breasts: Symmetric, without lumps, nipple discharge, retractions. Abdomen: Soft, +BS.  Tender diffuse lower quadrant, no guarding, rebound, hernias, masses, or organomegaly. .  Lymphatics: Non tender without lymphadenopathy.  Genitourinary: defer Musculoskeletal: Full ROM all peripheral extremities,5/5 strength, and normal gait. + spinal tenderness C6-C7, + left shoulder impingement sign, good relfexes, pulses, and sensation distal.  Skin: Warm, dry without rashes, lesions, ecchymosis.  Neuro: Cranial nerves intact, reflexes equal bilaterally. Normal muscle tone, no cerebellar symptoms. Sensation intact.  Psych: Awake and oriented X 3, normal affect, Insight and Judgment appropriate.   EKG: WNL no changes. AORTA SCAN: WNL    Vicie Mutters 9:23 AM

## 2013-12-07 LAB — HEPATIC FUNCTION PANEL
ALBUMIN: 3.7 g/dL (ref 3.5–5.2)
ALT: 16 U/L (ref 0–35)
AST: 19 U/L (ref 0–37)
Alkaline Phosphatase: 69 U/L (ref 39–117)
Bilirubin, Direct: 0.1 mg/dL (ref 0.0–0.3)
Indirect Bilirubin: 0.3 mg/dL (ref 0.2–1.2)
TOTAL PROTEIN: 6.5 g/dL (ref 6.0–8.3)
Total Bilirubin: 0.4 mg/dL (ref 0.2–1.2)

## 2013-12-07 LAB — BASIC METABOLIC PANEL WITH GFR
BUN: 12 mg/dL (ref 6–23)
CO2: 26 mEq/L (ref 19–32)
CREATININE: 0.69 mg/dL (ref 0.50–1.10)
Calcium: 9.2 mg/dL (ref 8.4–10.5)
Chloride: 104 mEq/L (ref 96–112)
Glucose, Bld: 73 mg/dL (ref 70–99)
POTASSIUM: 4.1 meq/L (ref 3.5–5.3)
Sodium: 139 mEq/L (ref 135–145)

## 2013-12-07 LAB — LIPID PANEL
Cholesterol: 215 mg/dL — ABNORMAL HIGH (ref 0–200)
HDL: 68 mg/dL (ref >39)
LDL Cholesterol: 137 mg/dL — ABNORMAL HIGH (ref 0–99)
TRIGLYCERIDES: 52 mg/dL (ref <150)
Total CHOL/HDL Ratio: 3.2 Ratio
VLDL: 10 mg/dL (ref 0–40)

## 2013-12-07 LAB — CBC WITH DIFFERENTIAL/PLATELET
BASOS ABS: 0 10*3/uL (ref 0.0–0.1)
BASOS PCT: 1 % (ref 0–1)
EOS PCT: 3 % (ref 0–5)
Eosinophils Absolute: 0.1 10*3/uL (ref 0.0–0.7)
HEMATOCRIT: 37.4 % (ref 36.0–46.0)
Hemoglobin: 12.8 g/dL (ref 12.0–15.0)
Lymphocytes Relative: 56 % — ABNORMAL HIGH (ref 12–46)
Lymphs Abs: 1.6 10*3/uL (ref 0.7–4.0)
MCH: 28.6 pg (ref 26.0–34.0)
MCHC: 34.2 g/dL (ref 30.0–36.0)
MCV: 83.5 fL (ref 78.0–100.0)
MONO ABS: 0.2 10*3/uL (ref 0.1–1.0)
Monocytes Relative: 7 % (ref 3–12)
NEUTROS ABS: 0.9 10*3/uL — AB (ref 1.7–7.7)
Neutrophils Relative %: 33 % — ABNORMAL LOW (ref 43–77)
PLATELETS: 196 10*3/uL (ref 150–400)
RBC: 4.48 MIL/uL (ref 3.87–5.11)
RDW: 13.8 % (ref 11.5–15.5)
WBC: 2.8 10*3/uL — ABNORMAL LOW (ref 4.0–10.5)

## 2013-12-07 LAB — URINALYSIS, ROUTINE W REFLEX MICROSCOPIC
Bilirubin Urine: NEGATIVE
GLUCOSE, UA: NEGATIVE mg/dL
HGB URINE DIPSTICK: NEGATIVE
Ketones, ur: NEGATIVE mg/dL
LEUKOCYTES UA: NEGATIVE
Nitrite: NEGATIVE
PH: 6 (ref 5.0–8.0)
Protein, ur: NEGATIVE mg/dL
Specific Gravity, Urine: 1.017 (ref 1.005–1.030)
Urobilinogen, UA: 0.2 mg/dL (ref 0.0–1.0)

## 2013-12-07 LAB — IRON AND TIBC
%SAT: 41 % (ref 20–55)
IRON: 103 ug/dL (ref 42–145)
TIBC: 252 ug/dL (ref 250–470)
UIBC: 149 ug/dL (ref 125–400)

## 2013-12-07 LAB — CORTISOL: Cortisol, Plasma: 5.9 ug/dL

## 2013-12-07 LAB — TSH: TSH: 0.78 u[IU]/mL (ref 0.350–4.500)

## 2013-12-07 LAB — FERRITIN: Ferritin: 76 ng/mL (ref 10–291)

## 2013-12-07 LAB — MICROALBUMIN / CREATININE URINE RATIO
Creatinine, Urine: 146 mg/dL
MICROALB UR: 0.5 mg/dL (ref 0.00–1.89)
MICROALB/CREAT RATIO: 3.4 mg/g (ref 0.0–30.0)

## 2013-12-07 LAB — URINE CULTURE
COLONY COUNT: NO GROWTH
Organism ID, Bacteria: NO GROWTH

## 2013-12-07 LAB — INSULIN, FASTING: Insulin fasting, serum: 5 u[IU]/mL (ref 3–28)

## 2013-12-07 LAB — VITAMIN B12: Vitamin B-12: 527 pg/mL (ref 211–911)

## 2013-12-07 LAB — FOLATE RBC: RBC Folate: 321 ng/mL (ref >280)

## 2013-12-07 LAB — MAGNESIUM: MAGNESIUM: 1.9 mg/dL (ref 1.5–2.5)

## 2013-12-07 LAB — SEDIMENTATION RATE: SED RATE: 4 mm/h (ref 0–22)

## 2013-12-07 LAB — VITAMIN D 25 HYDROXY (VIT D DEFICIENCY, FRACTURES): VIT D 25 HYDROXY: 68 ng/mL (ref 30–89)

## 2013-12-09 LAB — ACTH: C206 ACTH: 15 pg/mL (ref 6–50)

## 2013-12-13 LAB — ALDOSTERONE + RENIN ACTIVITY W/ RATIO
ALDO / PRA Ratio: 3.1 Ratio (ref 0.9–28.9)
ALDOSTERONE: 2 ng/dL
PRA LC/MS/MS: 0.64 ng/mL/h (ref 0.25–5.82)

## 2014-02-16 ENCOUNTER — Ambulatory Visit (INDEPENDENT_AMBULATORY_CARE_PROVIDER_SITE_OTHER): Payer: 59 | Admitting: Physician Assistant

## 2014-02-16 ENCOUNTER — Encounter: Payer: Self-pay | Admitting: Physician Assistant

## 2014-02-16 VITALS — BP 102/60 | HR 60 | Temp 98.1°F | Resp 16 | Ht 64.0 in | Wt 140.0 lb

## 2014-02-16 DIAGNOSIS — H6123 Impacted cerumen, bilateral: Secondary | ICD-10-CM

## 2014-02-16 DIAGNOSIS — F411 Generalized anxiety disorder: Secondary | ICD-10-CM

## 2014-02-16 DIAGNOSIS — H6120 Impacted cerumen, unspecified ear: Secondary | ICD-10-CM

## 2014-02-16 DIAGNOSIS — N951 Menopausal and female climacteric states: Secondary | ICD-10-CM

## 2014-02-16 MED ORDER — ESCITALOPRAM OXALATE 10 MG PO TABS: 10.0000 mg | ORAL_TABLET | Freq: Every day | ORAL | Status: DC

## 2014-02-16 NOTE — Patient Instructions (Signed)
VAGINAL DRYNESS OVERVIEW  Vaginal dryness, also known as atrophic vaginitis, is a common condition in postmenopausal women. This condition is also common in women who have had both ovaries removed at the time of hysterectomy.   Some women have uncomfortable symptoms of vaginal dryness, such as pain with sex, burning vaginal discomfort or itching, or abnormal vaginal discharge, while others have no symptoms at all.  VAGINAL DRYNESS CAUSES   Estrogen helps to keep the vagina moist and to maintain thickness of the vaginal lining. Vaginal dryness occurs when the ovaries produce a decreased amount of estrogen. This can occur at certain times in a woman's life, and may be permanent or temporary. Times when less estrogen is made include: ?At the time of menopause. ?After surgical removal of the ovaries, chemotherapy, or radiation therapy of the pelvis for cancer. ?After having a baby, particularly in women who breastfeed. ?While using certain medications, such as danazol, medroxyprogesterone (brand names: Provera or DepoProvera), leuprolide (brand name: Lupron), or nafarelin. When these medications are stopped, estrogen production resumes.  Women who smoke cigarettes have been shown to have an increased risk of an earlier menopause transition as compared to non-smokers. Therefore, atrophic vaginitis symptoms may appear at a younger age in this population.  VAGINAL DRYNESS TREATMENT   There are three treatment options for women with vaginal dryness:  Vaginal lubricants and moisturizers - Vaginal lubricants and moisturizers can be purchased without a prescription. These products do not contain any hormones and have virtually no side effects. - Albolene is found in the facial cleanser section at [facility], [facility], or [employer]. It is a large jar with a blue top. This is the best lubricant for women because it is hypoallergenic. -Natural lubricants, such as olive, avocado or peanut oil, are easily available  products that may be used as a lubricant with sex.  -Vaginal moisturizes (eg, Replens, Moist Again, Vagisil, K-Y Silk-E, and Feminease) are formulated to allow water to be retained in the vaginal tissues. Moisturizers are applied into the vagina three times weekly to allow a continued moisturizing effect. These should not be used just before having sex, as they can be irritating.  Vaginal estrogen - Vaginal estrogen is the most effective treatment option for women with vaginal dryness. Vaginal estrogen must be prescribed by a healthcare provider. Very low doses of vaginal estrogen can be used when it is put into the vagina to treat vaginal dryness. A small amount of estrogen is absorbed into the bloodstream, but only about 100 times less than when using estrogen pills or tablets. As a result, there is a much lower risk of side effects, such as blood clots, breast cancer, and heart attack, compared with other estrogen-containing products (birth control pills, menopausal hormone therapy).   Ospemifene - Ospemifene is a prescription medication that is similar to estrogen, but is not estrogen. In the vaginal tissue, it acts similarly to estrogen. In the breast tissue, it acts as an estrogen blocker. It comes in a pill, and is prescribed for women who want to use an estrogen-like medication for vaginal dryness or painful sex associated with vaginal dryness, but prefer not to use a vaginal medication. The medication may cause hot flashes as a side effect. This type of medication may increase the risk of blood clots or uterine cancer. Further study of ospemifene is needed to evaluate the risk of these complications. This medication has not been tested in women who have had breast cancer or are at a high risk of developing   breast cancer.    Sexual activity - Vaginal estrogen improves vaginal dryness quickly, usually within a few weeks. You may continue to have sex as you treat vaginal dryness because sex itself  can help to keep the vaginal tissues healthy. Vaginal intercourse may help the vaginal tissues by keeping them soft and stretchable and preventing the tissues from shrinking.  If sex continues to be painful despite treatment for vaginal dryness, talk to your healthcare provider.    

## 2014-02-16 NOTE — Progress Notes (Signed)
   Subjective:    Patient ID: [name], female    DOB: Dec 31, 1950, 63 y.o.   MRN: [id number]  HPI 63 y.o. female presents for hot flashes and decreased hearing left ear. She states she woke up one morning and she states it feels full. She has had some dull frontal headache with clear sinus drainage with some dizziness worse with standing. Normal MR brain 08/2012.   Her LMP was at age 60, she has had hot flashes intermittently since then but they have gotten worse the last 3-6 months. She has been having anxiety, some depression, decreased concentration at work, much more tearful.   Review of Systems  Constitutional: Positive for fatigue. Negative for chills, diaphoresis and appetite change.  HENT: Positive for hearing loss (left side).   Respiratory: Negative.   Cardiovascular: Negative.   Gastrointestinal: Negative.   Genitourinary: Negative.   Musculoskeletal: Negative.   Skin: Negative.   Neurological: Positive for dizziness. Negative for tremors, seizures, syncope, facial asymmetry, speech difficulty, weakness, light-headedness, numbness and headaches.  Psychiatric/Behavioral: Positive for dysphoric mood and decreased concentration. Negative for suicidal ideas, sleep disturbance and self-injury. The patient is nervous/anxious.        Objective:   Physical Exam  Constitutional: She is oriented to person, place, and time. She appears well-developed and well-nourished.  HENT:  Head: Normocephalic and atraumatic.  Bilateral ear cerumen impactoin  Eyes: Conjunctivae are normal. Pupils are equal, round, and reactive to light.  Neck: Normal range of motion. Neck supple.  Cardiovascular: Normal rate and regular rhythm.   Pulmonary/Chest: Effort normal and breath sounds normal.  Abdominal: Soft. Bowel sounds are normal.  Musculoskeletal: Normal range of motion.  Neurological: She is alert and oriented to person, place, and time.  Skin: Skin is warm and dry.       Assessment &  Plan:  Hot flashes/anxiety- will try lexapro 10mg  Hear loss- likely due to bilateral cerumen impaction- cleaned out  Follow up in 4-8 weeks

## 2014-03-07 ENCOUNTER — Encounter (HOSPITAL_COMMUNITY): Payer: Self-pay | Admitting: Emergency Medicine

## 2014-03-07 ENCOUNTER — Observation Stay (HOSPITAL_COMMUNITY)
Admission: EM | Admit: 2014-03-07 | Discharge: 2014-03-08 | Disposition: A | Discharge status: Home / Self Care | Payer: 59 | Attending: Cardiology | Admitting: Cardiology

## 2014-03-07 ENCOUNTER — Emergency Department (HOSPITAL_COMMUNITY): Payer: 59

## 2014-03-07 DIAGNOSIS — R079 Chest pain, unspecified: Secondary | ICD-10-CM

## 2014-03-07 DIAGNOSIS — F3289 Other specified depressive episodes: Secondary | ICD-10-CM | POA: Insufficient documentation

## 2014-03-07 DIAGNOSIS — E559 Vitamin D deficiency, unspecified: Secondary | ICD-10-CM | POA: Insufficient documentation

## 2014-03-07 DIAGNOSIS — K219 Gastro-esophageal reflux disease without esophagitis: Secondary | ICD-10-CM | POA: Insufficient documentation

## 2014-03-07 DIAGNOSIS — F329 Major depressive disorder, single episode, unspecified: Secondary | ICD-10-CM | POA: Insufficient documentation

## 2014-03-07 DIAGNOSIS — E785 Hyperlipidemia, unspecified: Secondary | ICD-10-CM | POA: Diagnosis not present

## 2014-03-07 DIAGNOSIS — Z882 Allergy status to sulfonamides status: Secondary | ICD-10-CM | POA: Diagnosis not present

## 2014-03-07 DIAGNOSIS — K589 Irritable bowel syndrome without diarrhea: Secondary | ICD-10-CM | POA: Diagnosis not present

## 2014-03-07 DIAGNOSIS — E739 Lactose intolerance, unspecified: Secondary | ICD-10-CM | POA: Diagnosis not present

## 2014-03-07 DIAGNOSIS — M129 Arthropathy, unspecified: Secondary | ICD-10-CM | POA: Insufficient documentation

## 2014-03-07 DIAGNOSIS — I2510 Atherosclerotic heart disease of native coronary artery without angina pectoris: Secondary | ICD-10-CM | POA: Diagnosis not present

## 2014-03-07 DIAGNOSIS — R072 Precordial pain: Principal | ICD-10-CM | POA: Insufficient documentation

## 2014-03-07 DIAGNOSIS — E7800 Pure hypercholesterolemia, unspecified: Secondary | ICD-10-CM | POA: Insufficient documentation

## 2014-03-07 LAB — COMPREHENSIVE METABOLIC PANEL
ALBUMIN: 3.6 g/dL (ref 3.5–5.2)
ALK PHOS: 81 U/L (ref 39–117)
ALT: 15 U/L (ref 0–35)
ANION GAP: 13 (ref 5–15)
AST: 25 U/L (ref 0–37)
BUN: 15 mg/dL (ref 6–23)
CO2: 21 meq/L (ref 19–32)
CREATININE: 0.75 mg/dL (ref 0.50–1.10)
Calcium: 9.3 mg/dL (ref 8.4–10.5)
Chloride: 104 mEq/L (ref 96–112)
GFR calc Af Amer: >90 mL/min (ref >90)
GFR, EST NON AFRICAN AMERICAN: 90 mL/min — AB (ref >90)
GLUCOSE: 105 mg/dL — AB (ref 70–99)
POTASSIUM: 4.8 meq/L (ref 3.7–5.3)
Sodium: 138 mEq/L (ref 137–147)
Total Bilirubin: <0.2 mg/dL — ABNORMAL LOW (ref 0.3–1.2)
Total Protein: 7.2 g/dL (ref 6.0–8.3)

## 2014-03-07 LAB — CBC WITH DIFFERENTIAL/PLATELET
Basophils Absolute: 0 10*3/uL (ref 0.0–0.1)
Basophils Relative: 1 % (ref 0–1)
Eosinophils Absolute: 0.1 10*3/uL (ref 0.0–0.7)
Eosinophils Relative: 1 % (ref 0–5)
HEMATOCRIT: 37.5 % (ref 36.0–46.0)
Hemoglobin: 12.7 g/dL (ref 12.0–15.0)
LYMPHS ABS: 1.7 10*3/uL (ref 0.7–4.0)
LYMPHS PCT: 38 % (ref 12–46)
MCH: 28.3 pg (ref 26.0–34.0)
MCHC: 33.9 g/dL (ref 30.0–36.0)
MCV: 83.5 fL (ref 78.0–100.0)
MONO ABS: 0.4 10*3/uL (ref 0.1–1.0)
Monocytes Relative: 8 % (ref 3–12)
NEUTROS ABS: 2.3 10*3/uL (ref 1.7–7.7)
NEUTROS PCT: 52 % (ref 43–77)
Platelets: 208 10*3/uL (ref 150–400)
RBC: 4.49 MIL/uL (ref 3.87–5.11)
RDW: 12.2 % (ref 11.5–15.5)
WBC: 4.4 10*3/uL (ref 4.0–10.5)

## 2014-03-07 LAB — APTT: APTT: 35 s (ref 24–37)

## 2014-03-07 LAB — TROPONIN I
Troponin I: <0.3 ng/mL (ref <0.30)
Troponin I: <0.3 ng/mL (ref <0.30)

## 2014-03-07 LAB — PROTIME-INR
INR: 1.02 (ref 0.00–1.49)
Prothrombin Time: 13.4 seconds (ref 11.6–15.2)

## 2014-03-07 LAB — D-DIMER, QUANTITATIVE (NOT AT ARMC): D-Dimer, Quant: <0.27 ug/mL-FEU (ref 0.00–0.48)

## 2014-03-07 LAB — LIPASE, BLOOD: LIPASE: 33 U/L (ref 11–59)

## 2014-03-07 LAB — PRO B NATRIURETIC PEPTIDE: Pro B Natriuretic peptide (BNP): 43.8 pg/mL (ref 0–125)

## 2014-03-07 MED ORDER — METOPROLOL TARTRATE 25 MG/10 ML ORAL SUSPENSION
6.2500 mg | Freq: Two times a day (BID) | ORAL | Status: DC
  Filled 2014-03-07 (×2): qty 2.5

## 2014-03-07 MED ORDER — SODIUM CHLORIDE 0.9 % IV BOLUS (SEPSIS)
1000.0000 mL | Freq: Once | INTRAVENOUS | Status: AC
  Administered 2014-03-07: 1000 mL via INTRAVENOUS

## 2014-03-07 MED ORDER — PANTOPRAZOLE SODIUM 40 MG PO TBEC
40.0000 mg | DELAYED_RELEASE_TABLET | Freq: Every day | ORAL | Status: DC
  Administered 2014-03-08: 40 mg via ORAL
  Filled 2014-03-07: qty 1

## 2014-03-07 MED ORDER — ATORVASTATIN CALCIUM 80 MG PO TABS
80.0000 mg | ORAL_TABLET | Freq: Every day | ORAL | Status: DC
  Filled 2014-03-07 (×2): qty 1

## 2014-03-07 MED ORDER — HEPARIN BOLUS VIA INFUSION
3500.0000 [IU] | Freq: Once | INTRAVENOUS | Status: AC
  Administered 2014-03-07: 3500 [IU] via INTRAVENOUS
  Filled 2014-03-07: qty 3500

## 2014-03-07 MED ORDER — ONDANSETRON HCL 4 MG/2ML IJ SOLN: 4.0000 mg | Freq: Four times a day (QID) | INTRAMUSCULAR | Status: DC | PRN

## 2014-03-07 MED ORDER — ASPIRIN EC 81 MG PO TBEC
81.0000 mg | DELAYED_RELEASE_TABLET | Freq: Every day | ORAL | Status: DC
  Administered 2014-03-08: 81 mg via ORAL
  Filled 2014-03-07: qty 1

## 2014-03-07 MED ORDER — OMEGA-3-ACID ETHYL ESTERS 1 G PO CAPS
1.0000 g | ORAL_CAPSULE | Freq: Every day | ORAL | Status: DC
  Administered 2014-03-08: 1 g via ORAL
  Filled 2014-03-07: qty 1

## 2014-03-07 MED ORDER — CYCLOSPORINE 0.05 % OP EMUL
1.0000 [drp] | Freq: Two times a day (BID) | OPHTHALMIC | Status: DC
  Administered 2014-03-07: 1 [drp] via OPHTHALMIC
  Filled 2014-03-07 (×3): qty 1

## 2014-03-07 MED ORDER — ACETAMINOPHEN 325 MG PO TABS: 650.0000 mg | ORAL_TABLET | ORAL | Status: DC | PRN

## 2014-03-07 MED ORDER — SODIUM CHLORIDE 0.9 % IV BOLUS (SEPSIS): 250.0000 mL | Freq: Once | INTRAVENOUS | Status: DC

## 2014-03-07 MED ORDER — METOPROLOL TARTRATE 12.5 MG HALF TABLET: 6.2500 mg | ORAL_TABLET | Freq: Two times a day (BID) | ORAL | Status: DC

## 2014-03-07 MED ORDER — ASPIRIN 81 MG PO CHEW
81.0000 mg | CHEWABLE_TABLET | ORAL | Status: DC
  Filled 2014-03-07: qty 1

## 2014-03-07 MED ORDER — VITAMIN D 1000 UNITS PO TABS
5000.0000 [IU] | ORAL_TABLET | Freq: Every day | ORAL | Status: DC
  Administered 2014-03-08: 5000 [IU] via ORAL
  Filled 2014-03-07: qty 5

## 2014-03-07 MED ORDER — NITROGLYCERIN 0.4 MG SL SUBL: 0.4000 mg | SUBLINGUAL_TABLET | SUBLINGUAL | Status: DC | PRN

## 2014-03-07 MED ORDER — HEPARIN (PORCINE) IN NACL 100-0.45 UNIT/ML-% IJ SOLN
750.0000 [IU]/h | INTRAMUSCULAR | Status: DC
  Administered 2014-03-07: 750 [IU]/h via INTRAVENOUS
  Filled 2014-03-07: qty 250

## 2014-03-07 MED ORDER — ESCITALOPRAM OXALATE 10 MG PO TABS
10.0000 mg | ORAL_TABLET | Freq: Every day | ORAL | Status: DC
  Filled 2014-03-07: qty 1

## 2014-03-07 MED ORDER — ASPIRIN 81 MG PO CHEW
162.0000 mg | CHEWABLE_TABLET | Freq: Once | ORAL | Status: AC
Start: 2014-03-07 — End: 2014-03-07
  Administered 2014-03-07: 162 mg via ORAL
  Filled 2014-03-07: qty 2

## 2014-03-07 MED ORDER — ASPIRIN 81 MG PO TABS: 81.0000 mg | ORAL_TABLET | Freq: Every day | ORAL | Status: DC

## 2014-03-07 MED ORDER — NITROGLYCERIN 0.4 MG SL SUBL
0.4000 mg | SUBLINGUAL_TABLET | SUBLINGUAL | Status: DC | PRN
  Filled 2014-03-07: qty 1

## 2014-03-07 MED ORDER — SODIUM CHLORIDE 0.9 % IV SOLN
INTRAVENOUS | Status: DC
  Administered 2014-03-07 – 2014-03-08 (×2): via INTRAVENOUS

## 2014-03-07 MED ORDER — ASPIRIN 300 MG RE SUPP
300.0000 mg | RECTAL | Status: DC
  Filled 2014-03-07: qty 1

## 2014-03-07 NOTE — H&P (Signed)
[name] is an 63 y.o. female.   Chief Complaint: Chest pain HPI: Patient is 63 year old female with past rectal history significant for mild coronary artery disease, GERD, hypercholesteremia, history of irritable bowel syndrome, depression, arthritis, history of vitamin D deficiency, glucose intolerance, came to the ER complaining of retrosternal chest pain described as pressure grade 6/10 while walking from e to another building. States chest pain was grade 6/10 without associated nausea vomiting diaphoresis denies any shortness of breath states chest pain occasionally increased with deep breathing. Patient also gives history of exertional dyspnea. Patient had stress Myoview in September of 2011 which was mildly abnormal subsequently required cardiac catheterization and was noted to have mild coronary artery disease. Denies any palpitation lightheadedness or syncope. Denies PND orthopnea leg swelling. Denies any recent cardiac workup. EKG done in the ER showed nonspecific T wave changes a set of cardiac enzymes have been negative  Past Medical History  Diagnosis Date  . GERD (gastroesophageal reflux disease)   . Prediabetes   . Depression   . Arthritis     cervical and lumbar  . Vitamin D deficiency   . Hyperlipidemia     Past Surgical History  Procedure Laterality Date  . Cardiac catheterization  2011    normal (Dr. Terrence Dupont)    History reviewed. No pertinent family history. Social History:  reports that she has never smoked. She has never used smokeless tobacco. She reports that she does not drink alcohol or use illicit drugs.  Allergies:  Allergies  Allergen Reactions  . Bactrim [Sulfamethoxazole-Tmp Ds] Other (See Comments)    Patient preference to take medication without sulfa     (Not in a hospital admission)  Results for orders placed during the hospital encounter of 03/07/14 (from the past 48 hour(s))  CBC WITH DIFFERENTIAL     Status: None   Collection Time   03/07/14  4:07 PM      Result Value Ref Range   WBC 4.4  4.0 - 10.5 K/uL   RBC 4.49  3.87 - 5.11 MIL/uL   Hemoglobin 12.7  12.0 - 15.0 g/dL   HCT 37.5  36.0 - 46.0 %   MCV 83.5  78.0 - 100.0 fL   MCH 28.3  26.0 - 34.0 pg   MCHC 33.9  30.0 - 36.0 g/dL   RDW 12.2  11.5 - 15.5 %   Platelets 208  150 - 400 K/uL   Neutrophils Relative % 52  43 - 77 %   Neutro Abs 2.3  1.7 - 7.7 K/uL   Lymphocytes Relative 38  12 - 46 %   Lymphs Abs 1.7  0.7 - 4.0 K/uL   Monocytes Relative 8  3 - 12 %   Monocytes Absolute 0.4  0.1 - 1.0 K/uL   Eosinophils Relative 1  0 - 5 %   Eosinophils Absolute 0.1  0.0 - 0.7 K/uL   Basophils Relative 1  0 - 1 %   Basophils Absolute 0.0  0.0 - 0.1 K/uL  COMPREHENSIVE METABOLIC PANEL     Status: Abnormal   Collection Time    03/07/14  4:07 PM      Result Value Ref Range   Sodium 138  137 - 147 mEq/L   Potassium 4.8  3.7 - 5.3 mEq/L   Chloride 104  96 - 112 mEq/L   CO2 21  19 - 32 mEq/L   Glucose, Bld 105 (*) 70 - 99 mg/dL   BUN 15  6 -  23 mg/dL   Creatinine, Ser 0.75  0.50 - 1.10 mg/dL   Calcium 9.3  8.4 - 10.5 mg/dL   Total Protein 7.2  6.0 - 8.3 g/dL   Albumin 3.6  3.5 - 5.2 g/dL   AST 25  0 - 37 U/L   Comment: SLIGHT HEMOLYSIS     HEMOLYSIS AT THIS LEVEL MAY AFFECT RESULT   ALT 15  0 - 35 U/L   Alkaline Phosphatase 81  39 - 117 U/L   Total Bilirubin <0.2 (*) 0.3 - 1.2 mg/dL   GFR calc non Af Amer 90 (*) >90 mL/min   GFR calc Af Amer >90  >90 mL/min   Comment: (NOTE)     The eGFR has been calculated using the CKD EPI equation.     This calculation has not been validated in all clinical situations.     eGFR's persistently <90 mL/min signify possible Chronic Kidney     Disease.   Anion gap 13  5 - 15  LIPASE, BLOOD     Status: None   Collection Time    03/07/14  4:07 PM      Result Value Ref Range   Lipase 33  11 - 59 U/L  TROPONIN I     Status: None   Collection Time    03/07/14  4:07 PM      Result Value Ref Range   Troponin I <0.30  <0.30  ng/mL   Comment:            Due to the release kinetics of cTnI,     a negative result within the first hours     of the onset of symptoms does not rule out     myocardial infarction with certainty.     If myocardial infarction is still suspected,     repeat the test at appropriate intervals.  PRO B NATRIURETIC PEPTIDE     Status: None   Collection Time    03/07/14  4:07 PM      Result Value Ref Range   Pro B Natriuretic peptide (BNP) 43.8  0 - 125 pg/mL  D-DIMER, QUANTITATIVE     Status: None   Collection Time    03/07/14  4:08 PM      Result Value Ref Range   D-Dimer, Quant <0.27  0.00 - 0.48 ug/mL-FEU   Comment:            AT THE INHOUSE ESTABLISHED CUTOFF     VALUE OF 0.48 ug/mL FEU,     THIS ASSAY HAS BEEN DOCUMENTED     IN THE LITERATURE TO HAVE     A SENSITIVITY AND NEGATIVE     PREDICTIVE VALUE OF AT LEAST     98 TO 99%.  THE TEST RESULT     SHOULD BE CORRELATED WITH     AN ASSESSMENT OF THE CLINICAL     PROBABILITY OF DVT / VTE.   Dg Chest 2 View  03/07/2014   CLINICAL DATA:  Chest pain  EXAM: CHEST  2 VIEW  COMPARISON:  11/04/2013  FINDINGS: Cardiomediastinal silhouette is stable. No acute infiltrate or pleural effusion. No pulmonary edema. Bony thorax is unremarkable.  IMPRESSION: No active cardiopulmonary disease.   Electronically Signed   By: Lahoma Crocker M.D.   On: 03/07/2014 16:38    Review of Systems  Constitutional: Negative for fever and chills.  HENT: Negative for hearing loss.   Eyes: Negative for blurred vision, double vision and  photophobia.  Respiratory: Negative for cough, hemoptysis, sputum production and shortness of breath.   Cardiovascular: Positive for chest pain. Negative for palpitations, orthopnea, claudication and leg swelling.  Gastrointestinal: Negative for nausea, vomiting, abdominal pain and diarrhea.  Genitourinary: Negative for dysuria.  Neurological: Positive for dizziness. Negative for headaches.    Blood pressure 108/57, pulse 68,  temperature 98.5 F (36.9 C), temperature source Oral, resp. rate 15, height _0  (1.626 m), weight 62.596 kg (138 lb), SpO2 100.00%. Physical Exam  Constitutional: She is oriented to person, place, and time.  HENT:  Head: Normocephalic and atraumatic.  Eyes: Conjunctivae are normal. Left eye exhibits no discharge. No scleral icterus.  Neck: Normal range of motion. Neck supple. No JVD present. No tracheal deviation present. No thyromegaly present.  Cardiovascular: Normal rate and regular rhythm.  Exam reveals no friction rub.   No murmur heard. Respiratory: Effort normal and breath sounds normal. No respiratory distress. She has no wheezes. She has no rales.  GI: Soft. Bowel sounds are normal. She exhibits no distension. There is no tenderness. There is no guarding.  Musculoskeletal: She exhibits no edema and no tenderness.  Neurological: She is alert and oriented to person, place, and time.     Assessment/Plan Chest pain rule out MI GERD Hypercholesteremia History of irritable bowel syndrome Glucose intolerance Vitamin D deficiency Depression Arthritis Plan As per orders   Kaye Mitro N 03/07/2014, 7:26 PM

## 2014-03-07 NOTE — ED Notes (Signed)
Per pt, states chest pain, epigastric pain that started 30 minutes ago-states after she ate-states slight H/A-some discomfort when she takes a deep breath

## 2014-03-07 NOTE — ED Provider Notes (Signed)
Patient with chest pain. EKG overall reassuring. Did have negative cath 4 years ago. Discussed with Dr. Dorcas Mcmurray, who requested heparin and will admit the patient.  Medical screening examination/treatment/procedure(s) were conducted as a shared visit with non-physician practitioner(s) and myself.  I personally evaluated the patient during the encounter.   EKG Interpretation   Date/Time:  Tuesday March 07 2014 15:40:40 EDT Ventricular Rate:  66 PR Interval:  151 QRS Duration: 84 QT Interval:  382 QTC Calculation: 400 R Axis:   75 Text Interpretation:  Sinus rhythm Confirmed by Alvino Chapel  MD, Ovid Curd  [phone]) on 03/07/2014 11:56:28 PM Also confirmed by Alvino Chapel  MD, Heiley Shaikh  [phone])  on 03/07/2014 11:56:51 PM       Jasper Riling. Alvino Chapel, MD 03/07/14 [phone]

## 2014-03-07 NOTE — ED Notes (Signed)
Gave report to 4th floor. They state they need 30 minutes to clean room.

## 2014-03-07 NOTE — ED Notes (Signed)
Unable to hang heparin at this time due to no pump. Port[employer] equipment called, state we have a pump shortage. Port[employer] equipment will check for pumps and call back.

## 2014-03-07 NOTE — Progress Notes (Signed)
ANTICOAGULATION CONSULT NOTE - Initial Consult  Pharmacy Consult for Heparin Indication: Chest Pain  Allergies  Allergen Reactions  . Bactrim [Sulfamethoxazole-Tmp Ds] Other (See Comments)    Patient preference to take medication without sulfa    Patient Measurements: Height: 5\' 4"  (162.6 cm) Weight: 140 lb (63.504 kg) IBW/kg (Calculated) : 54.7 Heparin Dosing Weight: 63.5 kg  Vital Signs: Temp: 98.5 F (36.9 C) (09/22 1845) Temp src: Oral (09/22 1845) BP: 108/57 mmHg (09/22 1845) Pulse Rate: 68 (09/22 1845)  Labs:  Recent Labs  03/07/14 1607  HGB 12.7  HCT 37.5  PLT 208  CREATININE 0.75  TROPONINI <0.30    Estimated Creatinine Clearance: 63.8 ml/min (by C-G formula based on Cr of 0.75).   Medical History: Past Medical History  Diagnosis Date  . GERD (gastroesophageal reflux disease)   . Prediabetes   . Depression   . Arthritis     cervical and lumbar  . Vitamin D deficiency   . Hyperlipidemia     Medications:  Scheduled:   Infusions:  . heparin     Followed by  . heparin    . sodium chloride      Assessment:  63 year old female with complaint of chest pain  No H&P available at this time  Review of home meds - no warfarin/chronic anticoagulation PTA  Pharmacy asked to dose heparin for chest pain  Goal of Therapy:  Heparin level 0.3-0.7 units/ml Monitor platelets by anticoagulation protocol: Yes   Plan:   Obtain baseline PT/INR and aPTT  After labs drawn, give Heparin 3500 unit IV bolus x 1 followed by infusion @ 750 units/hr  Check Heparin level 6 hr after heparin started  Follow heparin level and CBC daily while on heparin  Jayvin Hurrell, Toribio Harbour, PharmD 03/07/2014,7:16 PM

## 2014-03-07 NOTE — ED Provider Notes (Signed)
CSN: [id number]     Arrival date & time 03/07/14  1532 History   First MD Initiated Contact with Patient 03/07/14 1536     Chief Complaint  Patient presents with  . Chest Pain     (Consider location/radiation/quality/duration/timing/severity/associated sxs/prior Treatment) The history is provided by the patient. No language interpreter was used.  [name] is a 63 y/o F with PMhx of DM, GERD, Arthritis, depression, vitamin D deficiency, HLD presenting to the ED with chest pain that started one hour prior to arrival to the ED. Patient reported that the chest pain came on suddenly when the patient was walking from [employer] to another building. Patient reported that the chest pain is localized to the center of her chest, epigastric region described as a dull aching pain that is intermittent in intensity, but constant - stated that deep inhalation makes the pain worse. Stated that during this onset of pain patient had left arm tingling and because diaphoretic - stated that she felt as if she was going to faint, but did not. Reported that the pain has somewhat improved since arriving to the ED. Stated that she drove herself to the ED. Reported that she took ASA 81 mg this morning. Denied shortness of breath, difficulty breathing, dizziness, blurred vision, sudden loss of vision, neck pain, neck stiffness, jaw pain, weakness, cough, fever, chills, leg swelling, travels. Father passed from heart failure.  PCP Dr. [facility] none   Past Medical History  Diagnosis Date  . GERD (gastroesophageal reflux disease)   . Prediabetes   . Depression   . Arthritis     cervical and lumbar  . Vitamin D deficiency   . Hyperlipidemia    Past Surgical History  Procedure Laterality Date  . Cardiac catheterization  2011    normal (Dr. Terrence Dupont)   History reviewed. No pertinent family history. History  Substance Use Topics  . Smoking status: Never Smoker   . Smokeless tobacco: Never Used  .  Alcohol Use: No   OB History   Grav Para Term Preterm Abortions TAB SAB Ect Mult Living                 Review of Systems  Constitutional: Positive for diaphoresis. Negative for fever and chills.  Eyes: Negative for visual disturbance.  Respiratory: Negative for cough, chest tightness and shortness of breath.   Cardiovascular: Positive for chest pain. Negative for leg swelling.  Gastrointestinal: Negative for nausea, vomiting and abdominal pain.  Musculoskeletal: Negative for back pain, neck pain and neck stiffness.  Neurological: Positive for light-headedness. Negative for dizziness, weakness and headaches.      Allergies  Bactrim  Home Medications   Prior to Admission medications   Medication Sig Start Date End Date Taking? Authorizing Provider  aspirin 81 MG tablet Take 81 mg by mouth daily.   Yes Historical Provider, MD  Cetirizine-Pseudoephedrine (ZYRTEC-D PO) Take 1 tablet by mouth every 12 (twelve) hours.   Yes Historical Provider, MD  Cholecalciferol (D-3-5) 5000 UNITS capsule Take 5,000 Units by mouth daily.   Yes Historical Provider, MD  cycloSPORINE (RESTASIS) 0.05 % ophthalmic emulsion Place 1 drop into both eyes 2 (two) times daily.   Yes Historical Provider, MD  esomeprazole (NEXIUM) 40 MG capsule Take 1 capsule (40 mg total) by mouth daily. 12/06/13  Yes Vicie Mutters, PA-C  Flaxseed, Linseed, (FLAXSEED OIL PO) Take by mouth daily.   Yes Historical Provider, MD  Lutein 20 MG TABS Take by mouth.  Yes Historical Provider, MD  magnesium chloride (SLOW-MAG) 64 MG TBEC Take 1 tablet by mouth daily.   Yes Historical Provider, MD  Multiple Vitamins-Minerals (WOMENS MULTI VITAMIN & MINERAL PO) Take 1 tablet by mouth daily.   Yes Historical Provider, MD  Omega-3 Fatty Acids (FISH OIL PO) Take by mouth daily.   Yes Historical Provider, MD  escitalopram (LEXAPRO) 10 MG tablet Take 1 tablet (10 mg total) by mouth daily. 02/16/14 02/16/15  Vicie Mutters, PA-C   BP 111/59  Pulse  73  Temp(Src) 98.5 F (36.9 C) (Oral)  Resp 15  Ht 5\' 4"  (1.626 m)  Wt 138 lb (62.596 kg)  BMI 23.68 kg/m2  SpO2 100% Physical Exam  Nursing note and vitals reviewed. Constitutional: She is oriented to person, place, and time. She appears well-developed and well-nourished.  HENT:  Head: Normocephalic and atraumatic.  Eyes: Conjunctivae and EOM are normal. Pupils are equal, round, and reactive to light. Right eye exhibits no discharge. Left eye exhibits no discharge.  Neck: Normal range of motion. Neck supple. No tracheal deviation present.  Cardiovascular: Normal rate, regular rhythm and normal heart sounds.   Pulses:      Radial pulses are 2+ on the right side, and 2+ on the left side.       Dorsalis pedis pulses are 2+ on the right side, and 2+ on the left side.  Cap refill < 3 seconds Negative swelling or pitting edema noted to the lower extremities bilaterally   Pulmonary/Chest: Effort normal and breath sounds normal. No respiratory distress. She has no wheezes. She has no rales. She exhibits no tenderness.  Abdominal: Soft. Bowel sounds are normal. She exhibits no distension. There is no tenderness. There is no rebound and no guarding.  Negative abdominal distension  BS normoactive in all 4 quadrants Abdomen soft upon palpation  Negative peritoneal signs, rigidity, or guarding noted  Musculoskeletal: Normal range of motion. She exhibits no edema and no tenderness.  Full ROM to upper and lower extremities without difficulty noted, negative ataxia noted.  Lymphadenopathy:    She has no cervical adenopathy.  Neurological: She is alert and oriented to person, place, and time. No cranial nerve deficit. She exhibits normal muscle tone. Coordination normal.  Cranial nerves III-XII grossly intact Strength 5+/5+ to upper and lower extremities bilaterally with resistance applied, equal distribution noted Equal grip strength bilaterally  Negative facial drooping Negative slurred speech   Negative aphasia Patient follows commands well  Patient answers questions appropriately  Fine motor skills intact  Skin: Skin is warm and dry. No rash noted. No erythema.  Psychiatric: She has a normal mood and affect. Her behavior is normal. Thought content normal.    ED Course  Procedures (including critical care time)  5:34 PM This provider spoke with Dr. Terrence Dupont, Cardiologist - discussed case in great detail, labs, imaging, Cardiac cath in 2011, vitals in great detail. As per physician, recommended second troponin to be performed and if normal patient can be discharged home and follow-up as an outpatient in his office tomorrow.   5:38 PM Continues to have mild chest pain and dizziness.   6:34 PM This provider spoke with Dr. Terrence Dupont, Cardiologist - discussed that patient is concerned and continues to have some mild chest pain without relief. As per physician recommended patient to be bolused on fluids and for patient to be started on IV heparin. Patient to be admitted to the hospital.   7:05 PM Dr. Alvino Chapel at bedside assessing patient. As  per physician, agreed to start heparin.   Results for orders placed during the hospital encounter of 03/07/14  CBC WITH DIFFERENTIAL      Result Value Ref Range   WBC 4.4  4.0 - 10.5 K/uL   RBC 4.49  3.87 - 5.11 MIL/uL   Hemoglobin 12.7  12.0 - 15.0 g/dL   HCT 37.5  36.0 - 46.0 %   MCV 83.5  78.0 - 100.0 fL   MCH 28.3  26.0 - 34.0 pg   MCHC 33.9  30.0 - 36.0 g/dL   RDW 12.2  11.5 - 15.5 %   Platelets 208  150 - 400 K/uL   Neutrophils Relative % 52  43 - 77 %   Neutro Abs 2.3  1.7 - 7.7 K/uL   Lymphocytes Relative 38  12 - 46 %   Lymphs Abs 1.7  0.7 - 4.0 K/uL   Monocytes Relative 8  3 - 12 %   Monocytes Absolute 0.4  0.1 - 1.0 K/uL   Eosinophils Relative 1  0 - 5 %   Eosinophils Absolute 0.1  0.0 - 0.7 K/uL   Basophils Relative 1  0 - 1 %   Basophils Absolute 0.0  0.0 - 0.1 K/uL  COMPREHENSIVE METABOLIC PANEL      Result Value Ref  Range   Sodium 138  137 - 147 mEq/L   Potassium 4.8  3.7 - 5.3 mEq/L   Chloride 104  96 - 112 mEq/L   CO2 21  19 - 32 mEq/L   Glucose, Bld 105 (*) 70 - 99 mg/dL   BUN 15  6 - 23 mg/dL   Creatinine, Ser 0.75  0.50 - 1.10 mg/dL   Calcium 9.3  8.4 - 10.5 mg/dL   Total Protein 7.2  6.0 - 8.3 g/dL   Albumin 3.6  3.5 - 5.2 g/dL   AST 25  0 - 37 U/L   ALT 15  0 - 35 U/L   Alkaline Phosphatase 81  39 - 117 U/L   Total Bilirubin <0.2 (*) 0.3 - 1.2 mg/dL   GFR calc non Af Amer 90 (*) >90 mL/min   GFR calc Af Amer >90  >90 mL/min   Anion gap 13  5 - 15  LIPASE, BLOOD      Result Value Ref Range   Lipase 33  11 - 59 U/L  TROPONIN I      Result Value Ref Range   Troponin I <0.30  <0.30 ng/mL  PRO B NATRIURETIC PEPTIDE      Result Value Ref Range   Pro B Natriuretic peptide (BNP) 43.8  0 - 125 pg/mL  D-DIMER, QUANTITATIVE      Result Value Ref Range   D-Dimer, Quant <0.27  0.00 - 0.48 ug/mL-FEU    Labs Review Labs Reviewed  COMPREHENSIVE METABOLIC PANEL - Abnormal; Notable for the following:    Glucose, Bld 105 (*)    Total Bilirubin <0.2 (*)    GFR calc non Af Amer 90 (*)    All other components within normal limits  CBC WITH DIFFERENTIAL  LIPASE, BLOOD  TROPONIN I  PRO B NATRIURETIC PEPTIDE  D-DIMER, QUANTITATIVE  TROPONIN I  PROTIME-INR  APTT  HEPARIN LEVEL (UNFRACTIONATED)  CBC    Imaging Review Dg Chest 2 View  03/07/2014   CLINICAL DATA:  Chest pain  EXAM: CHEST  2 VIEW  COMPARISON:  11/04/2013  FINDINGS: Cardiomediastinal silhouette is stable. No acute infiltrate or pleural effusion. No  pulmonary edema. Bony thorax is unremarkable.  IMPRESSION: No active cardiopulmonary disease.   Electronically Signed   By: Lahoma Crocker M.D.   On: 03/07/2014 16:38     EKG Interpretation None      MDM   Final diagnoses:  Chest pain, unspecified chest pain type    Medications  sodium chloride 0.9 % bolus 250 mL (250 mLs Intravenous Not Given 03/07/14 1752)  nitroGLYCERIN  (NITROSTAT) SL tablet 0.4 mg (not administered)  heparin bolus via infusion 3,500 Units (not administered)    Followed by  heparin ADULT infusion 100 units/mL (25000 units/250 mL) (not administered)  aspirin chewable tablet 162 mg (162 mg Oral Given 03/07/14 1619)  sodium chloride 0.9 % bolus 1,000 mL (1,000 mLs Intravenous New Bag/Given 03/07/14 1804)    Filed Vitals:   03/07/14 1845 03/07/14 1912 03/07/14 1923 03/07/14 1938  BP: 108/57   111/59  Pulse: 68   73  Temp: 98.5 F (36.9 C)     TempSrc: Oral     Resp: 15     Height:  5\' 4"  (1.626 m) 5\' 4"  (1.626 m)   Weight:  140 lb (63.504 kg) 138 lb (62.596 kg)   SpO2: 100%   100%   This provider reviewed the patient's chart. Patient last had a cardiac cath performed in 2011 by Dr. Terrence Dupont with unremarkable findings, LAD stenosis 10-15%.  EKG sonus rhythm with a heart rate of 66 bpm. Troponin negative elevation. BNP negative elevation. D-dimer negative elevation. CBC unremarkable. CMP unremarkable. Lipase negative elevation. Chest x-ray negative for acute cardiopulmonary disease. Doubt PE. Doubt pneumonia. Patient reported that she has history of low blood pressure, that it is normal for her to have a low blood pressure - chronically.spoke with Dr. Terrence Dupont who recommended patient to be admitted to the hospital and for patient to be started on heparin. Patient to be admitted to the hospital for cardiac rule out. Discussed plan for admission with patient who agrees to plan of care. Patient stable for transfer.   Jamse Mead, PA-C 03/07/14 1953

## 2014-03-08 ENCOUNTER — Ambulatory Visit (HOSPITAL_COMMUNITY)
Admission: EM | Admit: 2014-03-08 | Discharge: 2014-03-08 | Disposition: A | Discharge status: Home / Self Care | Payer: 59 | Source: Home / Self Care | Attending: Cardiology | Admitting: Cardiology

## 2014-03-08 LAB — COMPREHENSIVE METABOLIC PANEL
ALK PHOS: 68 U/L (ref 39–117)
ALT: 12 U/L (ref 0–35)
AST: 15 U/L (ref 0–37)
Albumin: 2.9 g/dL — ABNORMAL LOW (ref 3.5–5.2)
Anion gap: 10 (ref 5–15)
BUN: 15 mg/dL (ref 6–23)
CO2: 23 mEq/L (ref 19–32)
Calcium: 8.5 mg/dL (ref 8.4–10.5)
Chloride: 107 mEq/L (ref 96–112)
Creatinine, Ser: 0.67 mg/dL (ref 0.50–1.10)
GFR calc non Af Amer: >90 mL/min (ref >90)
GLUCOSE: 92 mg/dL (ref 70–99)
POTASSIUM: 3.7 meq/L (ref 3.7–5.3)
Sodium: 140 mEq/L (ref 137–147)
Total Bilirubin: 0.3 mg/dL (ref 0.3–1.2)
Total Protein: 5.8 g/dL — ABNORMAL LOW (ref 6.0–8.3)

## 2014-03-08 LAB — TROPONIN I
Troponin I: <0.3 ng/mL (ref <0.30)
Troponin I: <0.3 ng/mL (ref <0.30)

## 2014-03-08 LAB — CBC WITH DIFFERENTIAL/PLATELET
BASOS ABS: 0 10*3/uL (ref 0.0–0.1)
Basophils Relative: 1 % (ref 0–1)
EOS ABS: 0.1 10*3/uL (ref 0.0–0.7)
Eosinophils Relative: 2 % (ref 0–5)
HCT: 34.8 % — ABNORMAL LOW (ref 36.0–46.0)
Hemoglobin: 11.5 g/dL — ABNORMAL LOW (ref 12.0–15.0)
LYMPHS PCT: 62 % — AB (ref 12–46)
Lymphs Abs: 2.4 10*3/uL (ref 0.7–4.0)
MCH: 28.2 pg (ref 26.0–34.0)
MCHC: 33 g/dL (ref 30.0–36.0)
MCV: 85.3 fL (ref 78.0–100.0)
Monocytes Absolute: 0.3 10*3/uL (ref 0.1–1.0)
Monocytes Relative: 7 % (ref 3–12)
NEUTROS PCT: 28 % — AB (ref 43–77)
Neutro Abs: 1.1 10*3/uL — ABNORMAL LOW (ref 1.7–7.7)
PLATELETS: 170 10*3/uL (ref 150–400)
RBC: 4.08 MIL/uL (ref 3.87–5.11)
RDW: 12.2 % (ref 11.5–15.5)
WBC: 3.9 10*3/uL — ABNORMAL LOW (ref 4.0–10.5)

## 2014-03-08 LAB — LIPID PANEL
CHOLESTEROL: 177 mg/dL (ref 0–200)
HDL: 67 mg/dL (ref >39)
LDL Cholesterol: 104 mg/dL — ABNORMAL HIGH (ref 0–99)
TRIGLYCERIDES: 32 mg/dL (ref <150)
Total CHOL/HDL Ratio: 2.6 RATIO
VLDL: 6 mg/dL (ref 0–40)

## 2014-03-08 LAB — HEPARIN LEVEL (UNFRACTIONATED)
Heparin Unfractionated: 0.78 IU/mL — ABNORMAL HIGH (ref 0.30–0.70)
Heparin Unfractionated: 0.8 IU/mL — ABNORMAL HIGH (ref 0.30–0.70)

## 2014-03-08 LAB — HEMOGLOBIN A1C
HEMOGLOBIN A1C: 5.9 % — AB (ref <5.7)
MEAN PLASMA GLUCOSE: 123 mg/dL — AB (ref <117)

## 2014-03-08 LAB — TSH: TSH: 1.98 u[IU]/mL (ref 0.350–4.500)

## 2014-03-08 LAB — MAGNESIUM: Magnesium: 1.9 mg/dL (ref 1.5–2.5)

## 2014-03-08 MED ORDER — GI COCKTAIL ~~LOC~~
30.0000 mL | Freq: Four times a day (QID) | ORAL | Status: DC | PRN
  Administered 2014-03-08: 30 mL via ORAL
  Filled 2014-03-08: qty 30

## 2014-03-08 MED ORDER — SODIUM CHLORIDE 0.9 % IV SOLN
Freq: Once | INTRAVENOUS | Status: AC
  Administered 2014-03-08: 12:00:00 via INTRAVENOUS

## 2014-03-08 MED ORDER — TECHNETIUM TC 99M SESTAMIBI GENERIC - CARDIOLITE
10.0000 | Freq: Once | INTRAVENOUS | Status: AC | PRN
  Administered 2014-03-08: 10 via INTRAVENOUS

## 2014-03-08 MED ORDER — TECHNETIUM TC 99M SESTAMIBI GENERIC - CARDIOLITE
30.0000 | Freq: Once | INTRAVENOUS | Status: AC | PRN
  Administered 2014-03-08: 30 via INTRAVENOUS

## 2014-03-08 MED ORDER — HEPARIN (PORCINE) IN NACL 100-0.45 UNIT/ML-% IJ SOLN
650.0000 [IU]/h | INTRAMUSCULAR | Status: DC
  Filled 2014-03-08: qty 250

## 2014-03-08 NOTE — Discharge Summary (Signed)
  Discharge summary dictated on 03/08/2014 dictation number is [phone]

## 2014-03-08 NOTE — Progress Notes (Signed)
UR completed 

## 2014-03-08 NOTE — Progress Notes (Signed)
ANTICOAGULATION CONSULT NOTE - Follow Up Consult  Pharmacy Consult for Heparin Indication: chest pain/ACS  Allergies  Allergen Reactions  . Bactrim [Sulfamethoxazole-Tmp Ds] Other (See Comments)    Patient preference to take medication without sulfa    Patient Measurements: Height: 5\' 4"  (162.6 cm) Weight: 137 lb 4.8 oz (62.279 kg) IBW/kg (Calculated) : 54.7 Heparin Dosing Weight:   Vital Signs: Temp: 97.9 F (36.6 C) (09/23 0532) Temp src: Oral (09/23 0532) BP: 86/55 mmHg (09/23 0532) Pulse Rate: 54 (09/23 0532)  Labs:  Recent Labs  03/07/14 1607 03/07/14 1936 03/08/14 0320  HGB 12.7  --  11.5*  HCT 37.5  --  34.8*  PLT 208  --  170  APTT  --  35  --   LABPROT  --  13.4  --   INR  --  1.02  --   HEPARINUNFRC  --   --  0.78*  CREATININE 0.75  --  0.67  TROPONINI <0.30 <0.30 <0.30    Estimated Creatinine Clearance: 63.8 ml/min (by C-G formula based on Cr of 0.67).   Medications:  Infusions:  . sodium chloride 125 mL/hr at 03/08/14 0530  . heparin 650 Units/hr (03/08/14 0530)    Assessment: Patient with heparin level above goal.  No issues per RN.  Goal of Therapy:  Heparin level 0.3-0.7 units/ml Monitor platelets by anticoagulation protocol: Yes   Plan:  Decrease heparin to 650 units/hr Recheck level at [address], [place] 03/08/2014,6:14 AM

## 2014-03-08 NOTE — Discharge Instructions (Signed)
Chest Pain (Nonspecific) °It is often hard to give a specific diagnosis for the cause of chest pain. There is always a chance that your pain could be related to something serious, such as a heart attack or a blood clot in the lungs. You need to follow up with your health care provider for further evaluation. °CAUSES  °· Heartburn. °· Pneumonia or bronchitis. °· Anxiety or stress. °· Inflammation around your heart (pericarditis) or lung (pleuritis or pleurisy). °· A blood clot in the lung. °· A collapsed lung (pneumothorax). It can develop suddenly on its own (spontaneous pneumothorax) or from trauma to the chest. °· Shingles infection (herpes zoster virus). °The chest wall is composed of bones, muscles, and cartilage. Any of these can be the source of the pain. °· The bones can be bruised by injury. °· The muscles or cartilage can be strained by coughing or overwork. °· The cartilage can be affected by inflammation and become sore (costochondritis). °DIAGNOSIS  °Lab tests or other studies may be needed to find the cause of your pain. Your health care provider may have you take a test called an ambulatory electrocardiogram (ECG). An ECG records your heartbeat patterns over a 24-hour period. You may also have other tests, such as: °· Transthoracic echocardiogram (TTE). During echocardiography, sound waves are used to evaluate how blood flows through your heart. °· Transesophageal echocardiogram (TEE). °· Cardiac monitoring. This allows your health care provider to monitor your heart rate and rhythm in real time. °· Holter monitor. This is a portable device that records your heartbeat and can help diagnose heart arrhythmias. It allows your health care provider to track your heart activity for several days, if needed. °· Stress tests by exercise or by giving medicine that makes the heart beat faster. °TREATMENT  °· Treatment depends on what may be causing your chest pain. Treatment may include: °· Acid blockers for  heartburn. °· Anti-inflammatory medicine. °· Pain medicine for inflammatory conditions. °· Antibiotics if an infection is present. °· You may be advised to change lifestyle habits. This includes stopping smoking and avoiding alcohol, caffeine, and chocolate. °· You may be advised to keep your head raised (elevated) when sleeping. This reduces the chance of acid going backward from your stomach into your esophagus. °Most of the time, nonspecific chest pain will improve within 2-3 days with rest and mild pain medicine.  °HOME CARE INSTRUCTIONS  °· If antibiotics were prescribed, take them as directed. Finish them even if you start to feel better. °· For the next few days, avoid physical activities that bring on chest pain. Continue physical activities as directed. °· Do not use any tobacco products, including cigarettes, chewing tobacco, or electronic cigarettes. °· Avoid drinking alcohol. °· Only take medicine as directed by your health care provider. °· Follow your health care provider's suggestions for further testing if your chest pain does not go away. °· Keep any follow-up appointments you made. If you do not go to an appointment, you could develop lasting (chronic) problems with pain. If there is any problem keeping an appointment, call to reschedule. °SEEK MEDICAL CARE IF:  °· Your chest pain does not go away, even after treatment. °· You have a rash with blisters on your chest. °· You have a fever. °SEEK IMMEDIATE MEDICAL CARE IF:  °· You have increased chest pain or pain that spreads to your arm, neck, jaw, back, or abdomen. °· You have shortness of breath. °· You have an increasing cough, or you cough   up blood. °· You have severe back or abdominal pain. °· You feel nauseous or vomit. °· You have severe weakness. °· You faint. °· You have chills. °This is an emergency. Do not wait to see if the pain will go away. Get medical help at once. Call your local emergency services (911 in [place] Do not drive  yourself to the hospital. °MAKE SURE YOU:  °· Understand these instructions. °· Will watch your condition. °· Will get help right away if you are not doing well or get worse. °Document Released: 03/12/2005 Document Revised: 06/07/2013 Document Reviewed: 01/06/2008 °ExitCare® Patient Information ©2015 [employer]. This information is not intended to replace advice given to you by your health care provider. Make sure you discuss any questions you have with your health care provider. ° ° °Angina Pectoris °Angina pectoris, often just called angina, is extreme discomfort in your chest, neck, or arm caused by a lack of blood in the middle and thickest layer of your heart wall (myocardium). It may feel like tightness or heavy pressure. It may feel like a crushing or squeezing pain. Some people say it feels like gas or indigestion. It may go down your shoulders, back, and arms. Some people may have symptoms other than pain. These symptoms include fatigue, shortness of breath, cold sweats, or nausea. There are four different types of angina: °· Stable angina--Stable angina usually occurs in episodes of predictable frequency and duration. It usually is brought on by physical activity, emotional stress, or excitement. These are all times when the myocardium needs more oxygen. Stable angina usually lasts a few minutes and often is relieved by taking a medicine that can be taken under your tongue (sublingually). The medicine is called nitroglycerin. Stable angina is caused by a buildup of plaque inside the arteries, which restricts blood flow to the heart muscle (atherosclerosis). °· Unstable angina--Unstable angina can occur even when your body experiences little or no physical exertion. It can occur during sleep. It can also occur at rest. It can suddenly increase in severity or frequency. It might not be relieved by sublingual nitroglycerin. It can last up to 30 minutes. The most common cause of unstable angina is a blood  clot that has developed on the top of plaque buildup inside a coronary artery. It can lead to a heart attack if the blood clot completely blocks the artery. °· Microvascular angina--This type of angina is caused by a disorder of tiny blood vessels called arterioles. Microvascular angina is more common in women. The pain may be more severe and last longer than other types of angina pectoris. °· Prinzmetal or variant angina--This type of angina pectoris usually occurs when your body experiences little or no physical exertion. It especially occurs in the early morning hours. It is caused by a spasm of your coronary artery. °HOME CARE INSTRUCTIONS  °· Only take over-the-counter and prescription medicines as directed by your health care provider. °· Stay active or increase your exercise as directed by your health care provider. °· Limit strenuous activity as directed by your health care provider. °· Limit heavy lifting as directed by your health care provider. °· Maintain a healthy weight. °· Learn about and eat heart-healthy foods. °· Do not use any tobacco products including cigarettes, chewing tobacco or electronic cigarettes. °SEEK IMMEDIATE MEDICAL CARE IF:  °You experience the following symptoms: °· Chest, neck, deep shoulder, or arm pain or discomfort that lasts more than a few minutes. °· Chest, neck, deep shoulder, or arm pain   or discomfort that goes away and comes back, repeatedly. °· Heavy sweating with discomfort, without a noticeable cause. °· Shortness of breath or difficulty breathing. °· Angina that does not get better after a few minutes of rest or after taking sublingual nitroglycerin. °These can all be symptoms of a heart attack, which is a medical emergency! Get medical help at once. Call your local emergency service (911 in [place] immediately. Do not  drive yourself to the hospital and do not  wait to for your symptoms to go away. °MAKE SURE YOU: °· Understand these instructions. °· Will watch your  condition. °· Will get help right away if you are not doing well or get worse. °Document Released: 06/02/2005 Document Revised: 06/07/2013 Document Reviewed: 10/04/2013 °ExitCare® Patient Information ©2015 [employer]. This information is not intended to replace advice given to you by your health care provider. Make sure you discuss any questions you have with your health care provider. ° °

## 2014-03-08 NOTE — Care Management Note (Signed)
    Page 1 of 1   03/08/2014     11:44:04 AM CARE MANAGEMENT NOTE 03/08/2014  Patient:  [name], [name]   Account Number:  [id number]  Date Initiated:  03/08/2014  Documentation initiated by:  Dessa Phi  Subjective/Objective Assessment:   63 Y/O F ADMITTED W/CHEST PAIN.     Action/Plan:   FROM HOME.   Anticipated DC Date:  03/08/2014   Anticipated DC Plan:  [place]  CM consult      Choice offered to / List presented to:             Status of service:  In process, will continue to follow Medicare Important Message given?   (If response is "NO", the following Medicare IM given date fields will be blank) Date Medicare IM given:   Medicare IM given by:   Date Additional Medicare IM given:   Additional Medicare IM given by:    Discharge Disposition:    Per UR Regulation:  Reviewed for med. necessity/level of care/duration of stay  If discussed at [place] of Stay Meetings, dates discussed:    Comments:  03/08/14 Loyalty Arentz RN,BSN NCM [phone] TEST.NO ANTICIPATED D/C NEEDS.

## 2014-03-09 NOTE — Discharge Summary (Signed)
[name], [name]                ACCOUNT NO.:  [id number]  MEDICAL RECORD NO.:  [id number]  LOCATION:  1420                         FACILITY:  [facility]  PHYSICIAN:  Lorana Maffeo N. Terrence Dupont, M.D. DATE OF BIRTH:  1951-02-29  DATE OF ADMISSION:  03/07/2014 DATE OF DISCHARGE:  03/08/2014                              DISCHARGE SUMMARY   ADMITTING DIAGNOSES: 1. Chest pain, rule out myocardial infarction. 2. Gastroesophageal reflux disease. 3. Hypercholesteremia. 4. History of irritable bowel syndrome. 5. Glucose intolerance. 6. Vitamin D deficiency. 7. Depression. 8. Arthritis.  DISCHARGE DIAGNOSES: 1. Status post recurrent chest pain, myocardial infarction ruled out,     negative stress Myoview. 2. Gastroesophageal reflux disease. 3. Hypercholesteremia. 4. History of irritable bowel syndrome. 5. Glucose intolerance. 6. Vitamin D deficiency. 7. Depression. 8. Arthritis.  DISCHARGE HOME MEDICATIONS:  Aspirin 81 mg 1 tablet daily, cyclosporine ophthalmic solution 1 drop in both eyes as before, vitamin D3 5000 units daily, Lexapro 10 mg daily, Nexium 40 mg 1 capsule daily, fish oil 1 g 1 capsule daily, flaxseed oil daily, Lutein 20 mg daily as before, Slow- Mag 1 tablet daily as before, Women's multivitamin with mineral 1 tablet daily.  DIET:  Low salt, low cholesterol.  ACTIVITY:  As tolerated.  CONDITION AT DISCHARGE:  Stable.  FOLLOWUP:  With me in 1 week.  BRIEF HISTORY AND HOSPITAL COURSE:  [name] is a 63 year old female with past medical history significant for mild coronary artery disease, GERD, hypercholesteremia, history of irritable bowel syndrome, depression, arthritis, history of vitamin D deficiency, glucose intolerance.  She came to the ER complaining of retrosternal chest pain described as pressure, grade 6/10 while walking from [employer] to another building.  States chest pain was grade 6/10, without associated nausea, vomiting, diaphoresis.  Denies any  shortness of breath.  States chest pain occasionally increases with deep breathing.  Also, has vague left arm discomfort.  The patient also gives history of exertional dyspnea, patient had stress Myoview in September 2011, which showed mild reversible ischemia, subsequently required cardiac catheterization and was noted to have mild coronary artery disease.  The patient denies any palpitation, lightheadedness, or syncope.  Denies PND, orthopnea, or leg swelling.  Denies recent cardiac workup.  EKG done in the ER showed nonspecific T-wave changes.  First set of cardiac enzyme was negative.  PAST MEDICAL HISTORY:  As above.  PHYSICAL EXAMINATION:  GENERAL:  She was alert, awake, oriented x3 in no acute distress. VITAL SIGNS:  Blood pressure was 108/57, pulse 68.  She was afebrile. EYES:  Conjunctivae was pink. NECK:  Supple.  No JVD.  No bruit. LUNGS:  Clear to auscultation without rhonchi rales. CARDIOVASCULAR:  S1, S2 was normal.  There was soft systolic murmur.  No S3, gallop. ABDOMEN:  Soft.  Bowel sounds were present, nontender. EXTREMITIES:  There is no clubbing, cyanosis, or edema. NEUROLOGIC:  Grossly intact.  LABORATORY DATA:  Labs, sodium was 138, potassium 4.8, BUN 15, creatinine 0.75, glucose was 105.  Repeat blood sugar was 92.  Four sets of cardiac enzymes were negative.  ProBNP was 43.  Cholesterol was 177, triglycerides 32, HDL was 67, LDL 104.  Hemoglobin was 12.7, hematocrit  37.5, white count of 4.4.  Chest x-ray showed no active cardiopulmonary disease.  Stress Myoview showed no evidence of perfusion defect with normal LV systolic function.  EF of 89%.  BRIEF HOSPITAL COURSE:  The patient was admitted to telemetry unit.  MI was ruled out by serial enzymes and EKG.  The patient subsequently underwent stress Myoview as above.  The patient did not have any further episodes of chest pain during the hospital stay.  The patient had episode of bradycardia and  hypotension.  Her beta blockers were stopped. The patient did not have any further episodes of chest pain during the hospital stay.  The patient will be discharged home on above medications and will be followed up in my office in 1 week and her PMD as scheduled.     Allegra Lai. Terrence Dupont, M.D.     MNH/MEDQ  D:  03/08/2014  T:  03/09/2014  Job:  [id number]

## 2014-03-21 ENCOUNTER — Ambulatory Visit: Payer: Self-pay | Admitting: Physician Assistant

## 2014-04-26 ENCOUNTER — Ambulatory Visit (INDEPENDENT_AMBULATORY_CARE_PROVIDER_SITE_OTHER): Payer: 59 | Admitting: Physician Assistant

## 2014-04-26 ENCOUNTER — Encounter: Payer: Self-pay | Admitting: Physician Assistant

## 2014-04-26 VITALS — BP 102/62 | HR 64 | Temp 97.7°F | Resp 16 | Ht 64.0 in | Wt 138.0 lb

## 2014-04-26 DIAGNOSIS — M2660 Temporomandibular joint disorder, unspecified: Secondary | ICD-10-CM

## 2014-04-26 DIAGNOSIS — M26609 Unspecified temporomandibular joint disorder, unspecified side: Secondary | ICD-10-CM

## 2014-04-26 DIAGNOSIS — M5412 Radiculopathy, cervical region: Secondary | ICD-10-CM

## 2014-04-26 DIAGNOSIS — N951 Menopausal and female climacteric states: Secondary | ICD-10-CM

## 2014-04-26 DIAGNOSIS — K2100 Gastro-esophageal reflux disease with esophagitis, without bleeding: Secondary | ICD-10-CM

## 2014-04-26 DIAGNOSIS — E8809 Other disorders of plasma-protein metabolism, not elsewhere classified: Secondary | ICD-10-CM

## 2014-04-26 LAB — BASIC METABOLIC PANEL WITH GFR
BUN: 20 mg/dL (ref 6–23)
CHLORIDE: 101 meq/L (ref 96–112)
CO2: 25 mEq/L (ref 19–32)
Calcium: 9.5 mg/dL (ref 8.4–10.5)
Creat: 0.71 mg/dL (ref 0.50–1.10)
GFR, Est African American: >89 mL/min
GFR, Est Non African American: >89 mL/min
Glucose, Bld: 85 mg/dL (ref 70–99)
POTASSIUM: 4.4 meq/L (ref 3.5–5.3)
Sodium: 137 mEq/L (ref 135–145)

## 2014-04-26 LAB — CBC WITH DIFFERENTIAL/PLATELET
Basophils Absolute: 0 10*3/uL (ref 0.0–0.1)
Basophils Relative: 1 % (ref 0–1)
EOS PCT: 3 % (ref 0–5)
Eosinophils Absolute: 0.1 10*3/uL (ref 0.0–0.7)
HEMATOCRIT: 38.5 % (ref 36.0–46.0)
HEMOGLOBIN: 13.1 g/dL (ref 12.0–15.0)
LYMPHS PCT: 51 % — AB (ref 12–46)
Lymphs Abs: 2 10*3/uL (ref 0.7–4.0)
MCH: 28.7 pg (ref 26.0–34.0)
MCHC: 34 g/dL (ref 30.0–36.0)
MCV: 84.2 fL (ref 78.0–100.0)
Monocytes Absolute: 0.3 10*3/uL (ref 0.1–1.0)
Monocytes Relative: 7 % (ref 3–12)
Neutro Abs: 1.5 10*3/uL — ABNORMAL LOW (ref 1.7–7.7)
Neutrophils Relative %: 38 % — ABNORMAL LOW (ref 43–77)
Platelets: 209 10*3/uL (ref 150–400)
RBC: 4.57 MIL/uL (ref 3.87–5.11)
RDW: 13.3 % (ref 11.5–15.5)
WBC: 4 10*3/uL (ref 4.0–10.5)

## 2014-04-26 LAB — HEPATIC FUNCTION PANEL
ALK PHOS: 76 U/L (ref 39–117)
ALT: 16 U/L (ref 0–35)
AST: 20 U/L (ref 0–37)
Albumin: 4 g/dL (ref 3.5–5.2)
BILIRUBIN INDIRECT: 0.2 mg/dL (ref 0.2–1.2)
BILIRUBIN TOTAL: 0.3 mg/dL (ref 0.2–1.2)
Bilirubin, Direct: 0.1 mg/dL (ref 0.0–0.3)
Total Protein: 6.9 g/dL (ref 6.0–8.3)

## 2014-04-26 MED ORDER — RANITIDINE HCL 300 MG PO TABS: 300.0000 mg | ORAL_TABLET | Freq: Every day | ORAL | Status: DC

## 2014-04-26 NOTE — Progress Notes (Signed)
Assessment and Plan:  Hypertension: Continue medication, monitor blood pressure at home. Continue DASH diet.  Reminder to go to the ER if any CP, SOB, nausea, dizziness, severe HA, changes vision/speech, left arm numbness and tingling, and jaw pain. Cholesterol: Continue diet and exercise. Check cholesterol.  Pre-diabetes-Continue diet and exercise. Check A1C Vitamin D Def- check level and continue medications. GERD- follow up with Dr. Collene Mares, try dexilant in the AM and zantac at night, check Hpylori Weight loss-Normal [place] AB, low protein, some diarrhea, follow up with Dr. Collene Mares, check labs.  TMJ-information given to the patient, no gum/decrease hard foods, warm wet wash clothes, decrease stress, talk with dentist about possible night guard, can do massage, and exercise.  Hot flashes- try lexapro.    Continue diet and meds as discussed. Further disposition pending results of labs.  HPI 63 y.o. female  presents for 3 month follow up with hypertension, hyperlipidemia, prediabetes and vitamin D. Her blood pressure has been controlled at home, today their BP is BP: 102/62 mmHg She does not workout. She denies chest pain, shortness of breath, dizziness.  She is not on cholesterol medication and denies myalgias. Her cholesterol is at goal. The cholesterol last visit was:   Lab Results  Component Value Date   CHOL 177 03/08/2014   HDL 67 03/08/2014   LDLCALC 104* 03/08/2014   TRIG 32 03/08/2014   CHOLHDL 2.6 03/08/2014   She has been working on diet and exercise for prediabetes, and denies paresthesia of the feet, polydipsia, polyuria and visual disturbances. Last A1C in the office was:  Lab Results  Component Value Date   HGBA1C 5.9* 03/08/2014   Patient is on Vitamin D supplement.   Lab Results  Component Value Date   VD25OH 68 12/06/2013     Patient admitted for chest pain 09/22 and discharged 09/23. She was evaluated by Dr. Terrence Dupont and had a negative stress myoview and pain was considered  to be from GERD.  She has not had any other chest pain, she states that she has had a dry barking cough worse with lying down. She is on OCT nexium and still has some break through GERD.  She has headaches, frontal and temporal worse on left, normal MRI head in 2014. + grinding of teeth at night.  Never started the lexapro for hot flashes.  She states she has had weight loss without trying, she has had some diarrhea occ, occ left lower quadrant pain, her albumin was low in the hospital.  Lab Results  Component Value Date   ALT 12 03/08/2014   AST 15 03/08/2014   ALKPHOS 68 03/08/2014   BILITOT 0.3 03/08/2014   Lab Results  Component Value Date   LABPROT 13.4 03/07/2014   APTT at 35.  Normal ESR in the past.   Current Medications:  Current Outpatient Prescriptions on File Prior to Visit  Medication Sig Dispense Refill  . aspirin 81 MG tablet Take 81 mg by mouth daily.    . Cholecalciferol (D-3-5) 5000 UNITS capsule Take 5,000 Units by mouth daily.    . cycloSPORINE (RESTASIS) 0.05 % ophthalmic emulsion Place 1 drop into both eyes 2 (two) times daily.    Marland Kitchen escitalopram (LEXAPRO) 10 MG tablet Take 1 tablet (10 mg total) by mouth daily. 30 tablet 2  . esomeprazole (NEXIUM) 40 MG capsule Take 1 capsule (40 mg total) by mouth daily. 30 capsule 5  . Flaxseed, Linseed, (FLAXSEED OIL PO) Take by mouth daily.    Marland Kitchen  Lutein 20 MG TABS Take by mouth.    . magnesium chloride (SLOW-MAG) 64 MG TBEC Take 1 tablet by mouth daily.    . Multiple Vitamins-Minerals (WOMENS MULTI VITAMIN & MINERAL PO) Take 1 tablet by mouth daily.    . Omega-3 Fatty Acids (FISH OIL PO) Take by mouth daily.     No current facility-administered medications on file prior to visit.   Medical History:  Past Medical History  Diagnosis Date  . GERD (gastroesophageal reflux disease)   . Prediabetes   . Depression   . Arthritis     cervical and lumbar  . Vitamin D deficiency   . Hyperlipidemia    Allergies:  Allergies   Allergen Reactions  . Bactrim [Sulfamethoxazole-Trimethoprim] Other (See Comments)    Patient preference to take medication without sulfa   Review of Systems: See HPI  Family history- Review and unchanged Social history- Review and unchanged Physical Exam: BP 102/62 mmHg  Pulse 64  Temp(Src) 97.7 F (36.5 C)  Resp 16  Ht _0  (1.626 m)  Wt 138 lb (62.596 kg)  BMI 23.68 kg/m2 Wt Readings from Last 3 Encounters:  04/26/14 138 lb (62.596 kg)  03/07/14 137 lb 4.8 oz (62.279 kg)  02/16/14 140 lb (63.504 kg)   General Appearance: Well nourished, in no apparent distress. Eyes: PERRLA, EOMs, conjunctiva no swelling or erythema Sinuses: No Frontal/maxillary tenderness ENT/Mouth: Ext aud canals clear, TMs without erythema, bulging. No erythema, swelling, or exudate on post pharynx.  Tonsils not swollen or erythematous. Hearing normal. + TMJ tenderness left more than right.  Neck: Supple, thyroid normal.  Respiratory: Respiratory effort normal, BS equal bilaterally without rales, rhonchi, wheezing or stridor.  Cardio: RRR with no MRGs. Brisk peripheral pulses without edema.  Abdomen: Soft, + BS.  Mild left lower quadrant tender, no guarding, rebound, hernias, masses. Lymphatics: Non tender without lymphadenopathy.  Musculoskeletal: Full ROM, 5/5 strength, normal gait.  Skin: Warm, dry without rashes, lesions, ecchymosis.  Neuro: Cranial nerves intact. Normal muscle tone, no cerebellar symptoms. Sensation intact.  Psych: Awake and oriented X 3, normal affect, Insight and Judgment appropriate.    Vicie Mutters, PA-C 3:26 PM [facility]

## 2014-04-26 NOTE — Patient Instructions (Signed)
Take dexilant samples once in the morning for 10 days and take zantac/pepcid (generic is fine) at night, see if this helps the cough. Contact Dr. Collene Mares for evaluation/ possible EGD.   Try the lexapro for the hot flashes.   Contact dentist for night guard for TMJ. Information and exercises are down below.   What is the TMJ? The temporomandibular (tem-PUH-ro-man-DIB-yoo-ler) joint, or the TMJ, connects the upper and lower jawbones. This joint allows the jaw to open wide and move back and forth when you chew, talk, or yawn.There are also several muscles that help this joint move. There can be muscle tightness and pain in the muscle that can cause several symptoms.  What causes TMJ pain? There are many causes of TMJ pain. Repeated chewing (for example, chewing gum) and clenching your teeth can cause pain in the joint. Some TMJ pain has no obvious cause. What can I do to ease the pain? There are many things you can do to help your pain get better. When you have pain:  Eat soft foods and stay away from chewy foods (for example, taffy) Try to use both sides of your mouth to chew Don't chew gum Don't open your mouth wide (for example, during yawning or singing) Don't bite your cheeks or fingernails Lower your amount of stress and worry Applying a warm, damp washcloth to the joint may help. Over-the-counter pain medicines such as ibuprofen (one brand: Advil) or acetaminophen (one brand: Tylenol) might also help. Do not use these medicines if you are allergic to them or if your doctor told you not to use them. How can I stop the pain from coming back? When your pain is better, you can do these exercises to make your muscles stronger and to keep the pain from coming back:  Resisted mouth opening: Place your thumb or two fingers under your chin and open your mouth slowly, pushing up lightly on your chin with your thumb. Hold for three to six seconds. Close your mouth slowly. Resisted mouth closing: Place  your thumbs under your chin and your two index fingers on the ridge between your mouth and the bottom of your chin. Push down lightly on your chin as you close your mouth. Tongue up: Slowly open and close your mouth while keeping the tongue touching the roof of the mouth. Side-to-side jaw movement: Place an object about one fourth of an inch thick (for example, two tongue depressors) between your front teeth. Slowly move your jaw from side to side. Increase the thickness of the object as the exercise becomes easier Forward jaw movement: Place an object about one fourth of an inch thick between your front teeth and move the bottom jaw forward so that the bottom teeth are in front of the top teeth. Increase the thickness of the object as the exercise becomes easier. These exercises should not be painful. If it hurts to do these exercises, stop doing them and talk to your family doctor.

## 2014-04-27 ENCOUNTER — Ambulatory Visit: Payer: Self-pay | Admitting: Physician Assistant

## 2014-04-27 LAB — URINALYSIS, ROUTINE W REFLEX MICROSCOPIC
Bilirubin Urine: NEGATIVE
GLUCOSE, UA: NEGATIVE mg/dL
Hgb urine dipstick: NEGATIVE
Ketones, ur: NEGATIVE mg/dL
LEUKOCYTES UA: NEGATIVE
Nitrite: NEGATIVE
Protein, ur: NEGATIVE mg/dL
Specific Gravity, Urine: 1.006 (ref 1.005–1.030)
UROBILINOGEN UA: 0.2 mg/dL (ref 0.0–1.0)
pH: 6 (ref 5.0–8.0)

## 2014-04-27 LAB — MICROALBUMIN / CREATININE URINE RATIO: Creatinine, Urine: 29 mg/dL

## 2014-04-27 LAB — SEDIMENTATION RATE: Sed Rate: 6 mm/hr (ref 0–22)

## 2014-04-27 LAB — CELIAC PANEL 10
Endomysial Screen: NEGATIVE
Gliadin IgA: 15 U/mL (ref <20)
Gliadin IgG: 6.4 U/mL (ref <20)
IgA: 262 mg/dL (ref 69–380)
TISSUE TRANSGLUT AB: 6 U/mL (ref <20)
Tissue Transglutaminase Ab, IgA: 11.5 U/mL (ref <20)

## 2014-04-28 LAB — HELICOBACTER PYLORI ABS-IGG+IGA, BLD
H PYLORI IGG: 0.55 {ISR}
HELICOBACTER PYLORI AB, IGA: 3.4 U/mL (ref <9.0)

## 2014-05-07 ENCOUNTER — Encounter: Payer: Self-pay | Admitting: *Deleted

## 2014-06-14 ENCOUNTER — Ambulatory Visit (INDEPENDENT_AMBULATORY_CARE_PROVIDER_SITE_OTHER): Payer: 59 | Admitting: Physician Assistant

## 2014-06-14 ENCOUNTER — Encounter: Payer: Self-pay | Admitting: Internal Medicine

## 2014-06-14 ENCOUNTER — Encounter (INDEPENDENT_AMBULATORY_CARE_PROVIDER_SITE_OTHER): Payer: Self-pay

## 2014-06-14 VITALS — BP 104/62 | HR 60 | Temp 97.9°F | Resp 16 | Ht 64.0 in | Wt 140.0 lb

## 2014-06-14 DIAGNOSIS — I10 Essential (primary) hypertension: Secondary | ICD-10-CM

## 2014-06-14 DIAGNOSIS — L723 Sebaceous cyst: Secondary | ICD-10-CM

## 2014-06-14 LAB — CBC WITH DIFFERENTIAL/PLATELET
BASOS PCT: 1 % (ref 0–1)
Basophils Absolute: 0 10*3/uL (ref 0.0–0.1)
Eosinophils Absolute: 0.1 10*3/uL (ref 0.0–0.7)
Eosinophils Relative: 3 % (ref 0–5)
HCT: 36.7 % (ref 36.0–46.0)
Hemoglobin: 12.8 g/dL (ref 12.0–15.0)
LYMPHS PCT: 51 % — AB (ref 12–46)
Lymphs Abs: 1.8 10*3/uL (ref 0.7–4.0)
MCH: 28.4 pg (ref 26.0–34.0)
MCHC: 34.9 g/dL (ref 30.0–36.0)
MCV: 81.6 fL (ref 78.0–100.0)
MONOS PCT: 8 % (ref 3–12)
MPV: 10.9 fL (ref 8.6–12.4)
Monocytes Absolute: 0.3 10*3/uL (ref 0.1–1.0)
NEUTROS ABS: 1.3 10*3/uL — AB (ref 1.7–7.7)
NEUTROS PCT: 37 % — AB (ref 43–77)
Platelets: 194 10*3/uL (ref 150–400)
RBC: 4.5 MIL/uL (ref 3.87–5.11)
RDW: 13.3 % (ref 11.5–15.5)
WBC: 3.5 10*3/uL — ABNORMAL LOW (ref 4.0–10.5)

## 2014-06-14 LAB — BASIC METABOLIC PANEL WITH GFR
BUN: 13 mg/dL (ref 6–23)
CALCIUM: 9.4 mg/dL (ref 8.4–10.5)
CHLORIDE: 104 meq/L (ref 96–112)
CO2: 28 mEq/L (ref 19–32)
CREATININE: 0.72 mg/dL (ref 0.50–1.10)
GFR, Est Non African American: >89 mL/min
Glucose, Bld: 81 mg/dL (ref 70–99)
Potassium: 4.6 mEq/L (ref 3.5–5.3)
Sodium: 138 mEq/L (ref 135–145)

## 2014-06-14 MED ORDER — DOXYCYCLINE HYCLATE 100 MG PO TABS: 100.0000 mg | ORAL_TABLET | Freq: Two times a day (BID) | ORAL | Status: DC

## 2014-06-14 MED ORDER — CYCLOBENZAPRINE HCL 10 MG PO TABS: 10.0000 mg | ORAL_TABLET | Freq: Every day | ORAL | Status: DC

## 2014-06-14 MED ORDER — PANTOPRAZOLE SODIUM 40 MG PO TBEC: 40.0000 mg | DELAYED_RELEASE_TABLET | Freq: Every day | ORAL | Status: DC

## 2014-06-14 NOTE — Patient Instructions (Addendum)
Will do wet warm compress or heating pad 10-20 mins twice a day Will do low dose antibiotic to help  If this does not help it go away we can send to plastic surgery or Derm to remove If it gets larger, if you start having fever, chills, sore throat, trouble opening mouth, then please call the office and we can get an Ultrasounds of the area.   Epidermal Cyst An epidermal cyst is sometimes called a sebaceous cyst, epidermal inclusion cyst, or infundibular cyst. These cysts usually contain a substance that looks "pasty" or "cheesy" and may have a bad smell. This substance is a protein called keratin. Epidermal cysts are usually found on the face, neck, or trunk. They may also occur in the vaginal area or other parts of the genitalia of both men and women. Epidermal cysts are usually small, painless, slow-growing bumps or lumps that move freely under the skin. It is important not to try to pop them. This may cause an infection and lead to tenderness and swelling. CAUSES  Epidermal cysts may be caused by a deep penetrating injury to the skin or a plugged hair follicle, often associated with acne. SYMPTOMS  Epidermal cysts can become inflamed and cause:  Redness.  Tenderness.  Increased temperature of the skin over the bumps or lumps.  Grayish-white, bad smelling material that drains from the bump or lump. DIAGNOSIS  Epidermal cysts are easily diagnosed by your caregiver during an exam. Rarely, a tissue sample (biopsy) may be taken to rule out other conditions that may resemble epidermal cysts. TREATMENT   Epidermal cysts often get better and disappear on their own. They are rarely ever cancerous.  If a cyst becomes infected, it may become inflamed and tender. This may require opening and draining the cyst. Treatment with antibiotics may be necessary. When the infection is gone, the cyst may be removed with minor surgery.  Small, inflamed cysts can often be treated with antibiotics or by  injecting steroid medicines.  Sometimes, epidermal cysts become large and bothersome. If this happens, surgical removal in your caregiver's office may be necessary. HOME CARE INSTRUCTIONS  Only take over-the-counter or prescription medicines as directed by your caregiver.  Take your antibiotics as directed. Finish them even if you start to feel better. SEEK MEDICAL CARE IF:   Your cyst becomes tender, red, or swollen.  Your condition is not improving or is getting worse.  You have any other questions or concerns. MAKE SURE YOU:  Understand these instructions.  Will watch your condition.  Will get help right away if you are not doing well or get worse. Document Released: 05/03/2004 Document Revised: 08/25/2011 Document Reviewed: 12/09/2010 [facility] Patient Information 2015 [place], [place]. This information is not intended to replace advice given to you by your health care provider. Make sure you discuss any questions you have with your health care provider.

## 2014-06-14 NOTE — Progress Notes (Signed)
   Subjective:    Patient ID: [name], female    DOB: [birth year], 63 y.o.   MRN: [id number]  HPI 63 y.o. AA female with lump x Wednesday on left manible. She states she noticed it Wednesday while washing her face, felt tender lump on left jaw line, it has gotten larger since then. She has had some sinus drainage, sore throat but states this is unchanged, denies fever, chills. She is on lexapro for hot flashes and states it has helped with hot flashes at night. She has had a history of possible MRSA abscess on her right elbow 2-3 years ago.  Would like protonix rather than dexilant due to cost for GERD.   Lab Results  Component Value Date   CREATININE 0.71 04/26/2014   BUN 20 04/26/2014   NA 137 04/26/2014   K 4.4 04/26/2014   CL 101 04/26/2014   CO2 25 04/26/2014    Review of Systems  Constitutional: Negative for fever, chills and fatigue.  HENT: Positive for congestion, ear pain, postnasal drip and sore throat. Negative for dental problem, drooling, ear discharge, facial swelling, hearing loss, mouth sores, nosebleeds, rhinorrhea, sinus pressure, sneezing, tinnitus, trouble swallowing and voice change.   Eyes: Negative.   Respiratory: Negative.   Cardiovascular: Negative.   Gastrointestinal: Negative.   Genitourinary: Negative.   Musculoskeletal: Negative.   Skin:       Change in skin on left cheek  Neurological: Negative.   Psychiatric/Behavioral: The patient is nervous/anxious.        Objective:   Physical Exam  Constitutional: She is oriented to person, place, and time. She appears well-developed and well-nourished.  HENT:  Head: Normocephalic and atraumatic.  Eyes: Conjunctivae and EOM are normal. Pupils are equal, round, and reactive to light.  Neck: Normal range of motion. Neck supple.  Abdominal: Soft. Bowel sounds are normal.  Lymphadenopathy:       Head (right side): No submental, no submandibular, no tonsillar, no preauricular, no posterior auricular and  no occipital adenopathy present.       Head (left side): No submental, no submandibular, no tonsillar, no preauricular, no posterior auricular and no occipital adenopathy present.    She has no cervical adenopathy.    She has no axillary adenopathy.  Neurological: She is alert and oriented to person, place, and time.  Skin: Skin is warm and dry. No rash noted.  Mobile, soft slightly tender 2cm x 1.5 cm       Assessment & Plan:  1. Sebaceous cyst Warm wet compresses, doxy, if not better will send to skin center  2. Essential hypertension - CBC with Differential - BASIC METABOLIC PANEL WITH GFR

## 2014-07-07 ENCOUNTER — Other Ambulatory Visit: Payer: Self-pay | Admitting: Physician Assistant

## 2014-08-09 ENCOUNTER — Ambulatory Visit (INDEPENDENT_AMBULATORY_CARE_PROVIDER_SITE_OTHER): Payer: 59 | Admitting: Internal Medicine

## 2014-08-09 ENCOUNTER — Encounter: Payer: Self-pay | Admitting: Internal Medicine

## 2014-08-09 VITALS — BP 92/58 | HR 66 | Temp 98.2°F | Resp 16 | Ht 64.0 in | Wt 144.0 lb

## 2014-08-09 DIAGNOSIS — R7309 Other abnormal glucose: Secondary | ICD-10-CM

## 2014-08-09 DIAGNOSIS — Z79899 Other long term (current) drug therapy: Secondary | ICD-10-CM

## 2014-08-09 DIAGNOSIS — R030 Elevated blood-pressure reading, without diagnosis of hypertension: Secondary | ICD-10-CM

## 2014-08-09 DIAGNOSIS — R0989 Other specified symptoms and signs involving the circulatory and respiratory systems: Secondary | ICD-10-CM | POA: Insufficient documentation

## 2014-08-09 DIAGNOSIS — IMO0001 Reserved for inherently not codable concepts without codable children: Secondary | ICD-10-CM

## 2014-08-09 DIAGNOSIS — R7303 Prediabetes: Secondary | ICD-10-CM

## 2014-08-09 DIAGNOSIS — K219 Gastro-esophageal reflux disease without esophagitis: Secondary | ICD-10-CM

## 2014-08-09 DIAGNOSIS — E785 Hyperlipidemia, unspecified: Secondary | ICD-10-CM

## 2014-08-09 DIAGNOSIS — R300 Dysuria: Secondary | ICD-10-CM

## 2014-08-09 DIAGNOSIS — E559 Vitamin D deficiency, unspecified: Secondary | ICD-10-CM

## 2014-08-09 NOTE — Progress Notes (Signed)
Patient ID: [name], female   DOB: [birth year], 64 y.o.   MRN: [id number]   This very nice 64 y.o. MWF presents for  follow up with Hypertension, Hyperlipidemia, Pre-Diabetes and Vitamin D Deficiency.    Patient is monitored for elevated BP  & BP has been controlled at home. Today's BP: (!) 92/58 mmHg. Patient has had no complaints of any cardiac type chest pain, palpitations, dyspnea/orthopnea/PND, dizziness, claudication, or dependent edema.   Hyperlipidemia is controlled with diet & meds. Patient denies myalgias or other med SE's. Last Lipids were near goal - Total Chol 177; HDL  67; LDL 104*; Trig 32 on 03/08/2014.   Also, the patient has history of PreDiabetes and has had no symptoms of reactive hypoglycemia, diabetic polys, paresthesias or visual blurring.  Last A1c was 5.9% on 03/08/2014.    Further, the patient also has history of Vitamin D Deficiency and supplements vitamin D without any suspected side-effects. Last vitamin D was 68 on 12/06/2013.  Medication Sig  . aspirin 81 MG tablet Take 81 mg by mouth daily.  . Cholecalciferol (D-3-5) 5000 UNITS capsule Take 5,000 Units by mouth daily.  Marland Kitchen doxycycline (VIBRA-TABS) 100 MG tablet Take 1 tablet (100 mg total) by mouth 2 (two) times daily.  Marland Kitchen escitalopram (LEXAPRO) 10 MG tablet Take 1 tablet (10 mg total) by mouth daily.  Marland Kitchen esomeprazole (NEXIUM) 40 MG capsule Take 1 capsule (40 mg total) by mouth daily.  . Flaxseed, Linseed, (FLAXSEED OIL PO) Take by mouth daily.  . Lutein 20 MG TABS Take by mouth.  . magnesium chloride (SLOW-MAG) 64 MG TBEC Take 1 tablet by mouth daily.  . Multiple Vitamins-Minerals (WOMENS MULTI VITAMIN & MINERAL PO) Take 1 tablet by mouth daily.  . Omega-3 Fatty Acids (FISH OIL PO) Take by mouth daily.  . pantoprazole (PROTONIX) 40 MG tablet Take 1 tablet (40 mg total) by mouth daily.  . ranitidine (ZANTAC) 300 MG tablet TAKE 1 TABLET BY MOUTH AT BEDTIME  . cyclobenzaprine (FLEXERIL) 10 MG tablet Take 1 tablet  (10 mg total) by mouth at bedtime. (Patient not taking: Reported on 08/09/2014)  . cycloSPORINE (RESTASIS) 0.05 % ophthalmic emulsion Place 1 drop into both eyes 2 (two) times daily.   Allergies  Allergen Reactions  . Bactrim [Sulfamethoxazole-Trimethoprim] Other (See Comments)    Patient preference to take medication without sulfa   PMHx:   Past Medical History  Diagnosis Date  . GERD (gastroesophageal reflux disease)   . Prediabetes   . Depression   . Arthritis     cervical and lumbar  . Vitamin D deficiency   . Hyperlipidemia    Immunization History  Administered Date(s) Administered  . Td 05/18/2007  . Zoster 05/17/2006   Past Surgical History  Procedure Laterality Date  . Cardiac catheterization  2011    normal (Dr. Terrence Dupont)   FHx:    Reviewed / unchanged  SHx:    Reviewed / unchanged  Systems Review:  Constitutional: Denies fever, chills, wt changes, headaches, insomnia, fatigue, night sweats, change in appetite. Eyes: Denies redness, blurred vision, diplopia, discharge, itchy, watery eyes.  ENT: Denies discharge, congestion, post nasal drip, epistaxis, sore throat, earache, hearing loss, dental pain, tinnitus, vertigo, sinus pain, snoring.  CV: Denies chest pain, palpitations, irregular heartbeat, syncope, dyspnea, diaphoresis, orthopnea, PND, claudication or edema. Respiratory: denies cough, dyspnea, DOE, pleurisy, hoarseness, laryngitis, wheezing.  Gastrointestinal: Denies dysphagia, odynophagia, heartburn, reflux, water brash, abdominal pain or cramps, nausea, vomiting, bloating, diarrhea, constipation, hematemesis, melena, hematochezia  or hemorrhoids. Genitourinary: Denies dysuria, frequency, urgency, nocturia, hesitancy, discharge, hematuria or flank pain. Musculoskeletal: Denies arthralgias, myalgias, stiffness, jt. swelling, pain, limping or strain/sprain.  Skin: Denies pruritus, rash, hives, warts, acne, eczema or change in skin lesion(s). Neuro: No weakness,  tremor, incoordination, spasms, paresthesia or pain. Psychiatric: Denies confusion, memory loss or sensory loss. Endo: Denies change in weight, skin or hair change.  Heme/Lymph: No excessive bleeding, bruising or enlarged lymph nodes.  Physical Exam  BP 92/58   Pulse 66  Temp 98.2 F  Resp 16  Ht 5\' 4"    Wt 144 lb    BMI 24.71  Appears well nourished and in no distress. Eyes: PERRLA, EOMs, conjunctiva no swelling or erythema. Sinuses: No frontal/maxillary tenderness ENT/Mouth: EAC's clear, TM's nl w/o erythema, bulging. Nares clear w/o erythema, swelling, exudates. Oropharynx clear without erythema or exudates. Oral hygiene is good. Tongue normal, non obstructing. Hearing intact.  Neck: Supple. Thyroid nl. Car 2+/2+ without bruits, nodes or JVD. Chest: Respirations nl with BS clear & equal w/o rales, rhonchi, wheezing or stridor.  Cor: Heart sounds normal w/ regular rate and rhythm without sig. murmurs, gallops, clicks, or rubs. Peripheral pulses normal and equal  without edema.  Abdomen: Soft & bowel sounds normal. Non-tender w/o guarding, rebound, hernias, masses, or organomegaly.  Lymphatics: Unremarkable.  Musculoskeletal: Full ROM all peripheral extremities, joint stability, 5/5 strength, and normal gait.  Skin: Warm, dry without exposed rashes, lesions or ecchymosis apparent.  Neuro: Cranial nerves intact, reflexes equal bilaterally. Sensory-motor testing grossly intact. Tendon reflexes grossly intact.  Pysch: Alert & oriented x 3.  Insight and judgement nl & appropriate. No ideations.  Assessment and Plan:  1. Elevated BP screening  - TSH  2. Hyperlipidemia  - Lipid panel  3. Prediabetes  - Hemoglobin A1c - Insulin, fasting  4. Vitamin D deficiency  - Vit D  25 hydroxy   5. Gastroesophageal reflux disease  6. Dysuria  - Urine Microscopic  7. Medication management  - CBC with Differential/Platelet - BASIC METABOLIC PANEL WITH GFR - Hepatic function  panel - Magnesium   Recommended regular exercise, BP monitoring, weight control, and discussed med and SE's. Recommended labs to assess and monitor clinical status. Further disposition pending results of labs.

## 2014-08-09 NOTE — Patient Instructions (Signed)

## 2014-08-10 LAB — CBC WITH DIFFERENTIAL/PLATELET
Basophils Absolute: 0 10*3/uL (ref 0.0–0.1)
Basophils Relative: 1 % (ref 0–1)
EOS ABS: 0.1 10*3/uL (ref 0.0–0.7)
EOS PCT: 3 % (ref 0–5)
HCT: 37.4 % (ref 36.0–46.0)
HEMOGLOBIN: 12.5 g/dL (ref 12.0–15.0)
LYMPHS ABS: 1.7 10*3/uL (ref 0.7–4.0)
Lymphocytes Relative: 46 % (ref 12–46)
MCH: 28.2 pg (ref 26.0–34.0)
MCHC: 33.4 g/dL (ref 30.0–36.0)
MCV: 84.4 fL (ref 78.0–100.0)
MONO ABS: 0.3 10*3/uL (ref 0.1–1.0)
MONOS PCT: 7 % (ref 3–12)
MPV: 10.6 fL (ref 8.6–12.4)
Neutro Abs: 1.6 10*3/uL — ABNORMAL LOW (ref 1.7–7.7)
Neutrophils Relative %: 43 % (ref 43–77)
Platelets: 221 10*3/uL (ref 150–400)
RBC: 4.43 MIL/uL (ref 3.87–5.11)
RDW: 13.3 % (ref 11.5–15.5)
WBC: 3.8 10*3/uL — ABNORMAL LOW (ref 4.0–10.5)

## 2014-08-10 LAB — TSH: TSH: 0.856 u[IU]/mL (ref 0.350–4.500)

## 2014-08-10 LAB — BASIC METABOLIC PANEL WITH GFR
BUN: 21 mg/dL (ref 6–23)
CHLORIDE: 105 meq/L (ref 96–112)
CO2: 28 meq/L (ref 19–32)
Calcium: 9.3 mg/dL (ref 8.4–10.5)
Creat: 0.74 mg/dL (ref 0.50–1.10)
GFR, EST NON AFRICAN AMERICAN: 87 mL/min
GFR, Est African American: >89 mL/min
GLUCOSE: 98 mg/dL (ref 70–99)
POTASSIUM: 4.5 meq/L (ref 3.5–5.3)
Sodium: 139 mEq/L (ref 135–145)

## 2014-08-10 LAB — HEPATIC FUNCTION PANEL
ALT: 15 U/L (ref 0–35)
AST: 17 U/L (ref 0–37)
Albumin: 3.9 g/dL (ref 3.5–5.2)
Alkaline Phosphatase: 77 U/L (ref 39–117)
BILIRUBIN DIRECT: 0.1 mg/dL (ref 0.0–0.3)
Indirect Bilirubin: 0.2 mg/dL (ref 0.2–1.2)
Total Bilirubin: 0.3 mg/dL (ref 0.2–1.2)
Total Protein: 6.5 g/dL (ref 6.0–8.3)

## 2014-08-10 LAB — HEMOGLOBIN A1C
Hgb A1c MFr Bld: 6.1 % — ABNORMAL HIGH (ref <5.7)
Mean Plasma Glucose: 128 mg/dL — ABNORMAL HIGH (ref <117)

## 2014-08-10 LAB — LIPID PANEL
CHOL/HDL RATIO: 2.8 ratio
Cholesterol: 222 mg/dL — ABNORMAL HIGH (ref 0–200)
HDL: 79 mg/dL (ref >=46)
LDL CALC: 130 mg/dL — AB (ref 0–99)
Triglycerides: 64 mg/dL (ref <150)
VLDL: 13 mg/dL (ref 0–40)

## 2014-08-10 LAB — URINALYSIS, MICROSCOPIC ONLY
Bacteria, UA: NONE SEEN
CRYSTALS: NONE SEEN
Casts: NONE SEEN
SQUAMOUS EPITHELIAL / LPF: NONE SEEN

## 2014-08-10 LAB — MAGNESIUM: Magnesium: 1.9 mg/dL (ref 1.5–2.5)

## 2014-08-10 LAB — INSULIN, FASTING: Insulin fasting, serum: 34.2 u[IU]/mL — ABNORMAL HIGH (ref 2.0–19.6)

## 2014-08-10 LAB — VITAMIN D 25 HYDROXY (VIT D DEFICIENCY, FRACTURES): Vit D, 25-Hydroxy: 56 ng/mL (ref 30–100)

## 2014-08-31 ENCOUNTER — Other Ambulatory Visit: Payer: Self-pay | Admitting: Physician Assistant

## 2014-11-21 ENCOUNTER — Encounter: Payer: Self-pay | Admitting: Physician Assistant

## 2014-11-21 ENCOUNTER — Ambulatory Visit (INDEPENDENT_AMBULATORY_CARE_PROVIDER_SITE_OTHER): Payer: 59 | Admitting: Physician Assistant

## 2014-11-21 VITALS — BP 90/58 | HR 66 | Temp 98.2°F | Resp 18 | Ht 64.0 in | Wt 134.0 lb

## 2014-11-21 DIAGNOSIS — R1032 Left lower quadrant pain: Secondary | ICD-10-CM

## 2014-11-21 DIAGNOSIS — R300 Dysuria: Secondary | ICD-10-CM

## 2014-11-21 DIAGNOSIS — E8809 Other disorders of plasma-protein metabolism, not elsewhere classified: Secondary | ICD-10-CM

## 2014-11-21 DIAGNOSIS — K219 Gastro-esophageal reflux disease without esophagitis: Secondary | ICD-10-CM

## 2014-11-21 DIAGNOSIS — R1031 Right lower quadrant pain: Secondary | ICD-10-CM

## 2014-11-21 DIAGNOSIS — R634 Abnormal weight loss: Secondary | ICD-10-CM

## 2014-11-21 LAB — BASIC METABOLIC PANEL WITH GFR
BUN: 13 mg/dL (ref 6–23)
CO2: 26 mEq/L (ref 19–32)
CREATININE: 0.63 mg/dL (ref 0.50–1.10)
Calcium: 9.7 mg/dL (ref 8.4–10.5)
Chloride: 100 mEq/L (ref 96–112)
GFR, Est African American: >89 mL/min
GLUCOSE: 83 mg/dL (ref 70–99)
POTASSIUM: 4 meq/L (ref 3.5–5.3)
Sodium: 133 mEq/L — ABNORMAL LOW (ref 135–145)

## 2014-11-21 LAB — HEPATIC FUNCTION PANEL
ALT: 17 U/L (ref 0–35)
AST: 21 U/L (ref 0–37)
Albumin: 3.9 g/dL (ref 3.5–5.2)
Alkaline Phosphatase: 81 U/L (ref 39–117)
BILIRUBIN INDIRECT: 0.2 mg/dL (ref 0.2–1.2)
Bilirubin, Direct: 0.1 mg/dL (ref 0.0–0.3)
Total Bilirubin: 0.3 mg/dL (ref 0.2–1.2)
Total Protein: 7 g/dL (ref 6.0–8.3)

## 2014-11-21 MED ORDER — ESCITALOPRAM OXALATE 10 MG PO TABS: 10.0000 mg | ORAL_TABLET | Freq: Every day | ORAL | Status: AC

## 2014-11-21 NOTE — Patient Instructions (Signed)
Irritable Bowel Syndrome Irritable bowel syndrome (IBS) is caused by a disturbance of normal bowel function and is a common digestive disorder. You may also hear this condition called spastic colon, mucous colitis, and irritable colon. There is no cure for IBS. However, symptoms often gradually improve or disappear with a good diet, stress management, and medicine. This condition usually appears in late adolescence or early adulthood. Women develop it twice as often as men. CAUSES  After food has been digested and absorbed in the small intestine, waste material is moved into the large intestine, or colon. In the colon, water and salts are absorbed from the undigested products coming from the small intestine. The remaining residue, or fecal material, is held for elimination. Under normal circumstances, gentle, rhythmic contractions of the bowel walls push the fecal material along the colon toward the rectum. In IBS, however, these contractions are irregular and poorly coordinated. The fecal material is either retained too long, resulting in constipation, or expelled too soon, producing diarrhea. SIGNS AND SYMPTOMS  The most common symptom of IBS is abdominal pain. It is often in the lower left side of the abdomen, but it may occur anywhere in the abdomen. The pain comes from spasms of the bowel muscles happening too much and from the buildup of gas and fecal material in the colon. This pain:  Can range from sharp abdominal cramps to a dull, continuous ache.  Often worsens soon after eating.  Is often relieved by having a bowel movement or passing gas. Abdominal pain is usually accompanied by constipation, but it may also produce diarrhea. The diarrhea often occurs right after a meal or upon waking up in the morning. The stools are often soft, watery, and flecked with mucus. Other symptoms of IBS include:  Bloating.  Loss of appetite.  Heartburn.  Backache.  Dull pain in the arms or  shoulders.  Nausea.  Burping.  Vomiting.  Gas. IBS may also cause symptoms that are unrelated to the digestive system, such as:  Fatigue.  Headaches.  Anxiety.  Shortness of breath.  Trouble concentrating.  Dizziness. These symptoms tend to come and go. DIAGNOSIS  The symptoms of IBS may seem like symptoms of other, more serious digestive disorders. Your health care provider may want to perform tests to exclude these disorders.  TREATMENT Many medicines are available to help correct bowel function or relieve bowel spasms and abdominal pain. Among the medicines available are:  Laxatives for severe constipation and to help restore normal bowel habits.  Specific antidiarrheal medicines to treat severe or lasting diarrhea.  Antispasmodic agents to relieve intestinal cramps. Your health care provider may also decide to treat you with a mild tranquilizer or sedative during unusually stressful periods in your life. Your health care provider may also prescribe antidepressant medicine. The use of this medicine has been shown to reduce pain and other symptoms of IBS. Remember that if any medicine is prescribed for you, you should take it exactly as directed. Make sure your health care provider knows how well it worked for you. HOME CARE INSTRUCTIONS   Take all medicines as directed by your health care provider.  Avoid foods that are high in fat or oils, such as heavy cream, butter, frankfurters, sausage, and other fatty meats.  Avoid foods that make you go to the bathroom, such as fruit, fruit juice, and dairy products.  Cut out carbonated drinks, chewing gum, and "gassy" foods such as beans and cabbage. This may help relieve bloating and burping.    Eat foods with bran, and drink plenty of liquids with the bran foods. This helps relieve constipation.  Keep track of what foods seem to bring on your symptoms.  Avoid emotionally charged situations or circumstances that produce  anxiety.  Start or continue exercising.  Get plenty of rest and sleep. Document Released: 06/02/2005 Document Revised: 06/07/2013 Document Reviewed: 01/21/2008 [employer] Patient Information 2015 [employer]. This information is not intended to replace advice given to you by your health care provider. Make sure you discuss any questions you have with your health care provider.  

## 2014-11-21 NOTE — Progress Notes (Signed)
Subjective:    Patient ID: [name], female    DOB: September 25, 1950, 64 y.o.   MRN: [id number]  HPI 63 y.o. AAF with history of GERD, preDM, depression, chol presents with abdominal pain and nausea for 2-3 weeks. Has lower AB pain worse left lower side, worse at night, worse if her legs are hanging over the bed and positional in bed. Achy, nagging pain. She has had constipation for last week, no blood or dark black stools. She has had abnormal urination, frequency, urgency and incontinence. No fever or chills. She has been having some AB bloating. She has also had weight loss but has been eating better due to DM.  She had normal colonoscopy with Dr. Winfield Cunas in 2014. She had CT AB pelvis in 10/2013 showing DDD normal and US pelvis in 2011 that showed uterine fibroids.   Wt Readings from Last 3 Encounters:  11/21/14 134 lb (60.782 kg)  08/09/14 144 lb (65.318 kg)  06/14/14 140 lb (63.504 kg)    Blood pressure 90/58, pulse 66, temperature 98.2 F (36.8 C), temperature source Temporal, resp. rate 18, height 5\' 4"  (1.626 m), weight 134 lb (60.782 kg).   Current Outpatient Prescriptions on File Prior to Visit  Medication Sig Dispense Refill  . aspirin 81 MG tablet Take 81 mg by mouth daily.    . Cholecalciferol (D-3-5) 5000 UNITS capsule Take 5,000 Units by mouth daily.    . Flaxseed, Linseed, (FLAXSEED OIL PO) Take by mouth daily.    . Lutein 20 MG TABS Take by mouth.    . magnesium chloride (SLOW-MAG) 64 MG TBEC Take 1 tablet by mouth daily.    . Multiple Vitamins-Minerals (WOMENS MULTI VITAMIN & MINERAL PO) Take 1 tablet by mouth daily.    . Omega-3 Fatty Acids (FISH OIL PO) Take by mouth daily.    . ranitidine (ZANTAC) 300 MG tablet TAKE 1 TABLET BY MOUTH AT BEDTIME 30 tablet 1   No current facility-administered medications on file prior to visit.   Past Medical History  Diagnosis Date  . GERD (gastroesophageal reflux disease)   . Prediabetes   . Depression   . Arthritis    cervical and lumbar  . Vitamin D deficiency   . Hyperlipidemia     Review of Systems  Constitutional: Positive for unexpected weight change. Negative for fever, chills, diaphoresis, activity change, appetite change and fatigue.  HENT: Negative.   Respiratory: Negative.   Cardiovascular: Negative.   Gastrointestinal: Positive for nausea, abdominal pain, constipation and abdominal distention. Negative for vomiting, diarrhea, blood in stool, anal bleeding and rectal pain.  Genitourinary: Negative.   Musculoskeletal: Negative.   Skin: Negative.   Neurological: Negative.   Psychiatric/Behavioral: Positive for sleep disturbance. Negative for suicidal ideas, hallucinations, behavioral problems, confusion, self-injury, dysphoric mood, decreased concentration and agitation. The patient is nervous/anxious. The patient is not hyperactive.        Objective:   Physical Exam  Constitutional: She is oriented to person, place, and time. She appears well-developed and well-nourished.  Neck: Normal range of motion. Neck supple.  Cardiovascular: Normal rate and regular rhythm.   Pulmonary/Chest: Effort normal and breath sounds normal.  Abdominal: Soft. Bowel sounds are normal. She exhibits no mass. There is tenderness in the right lower quadrant, epigastric area and left lower quadrant. There is guarding and positive Murphy's sign. There is no rigidity, no rebound, no CVA tenderness and no tenderness at McBurney's point. No hernia.  Musculoskeletal:  Full ROM bilateral hips without  pain, lower back without pain, negative straight leg raise  Neurological: She is alert and oriented to person, place, and time.  Skin: Skin is warm and dry. No rash noted.  Psychiatric: Her behavior is normal. Thought content normal. Her mood appears anxious. Her speech is not rapid and/or pressured.  Vitals reviewed.      Assessment & Plan:  AB pain/weight loss-  Still has ovaries, with weight loss, AB bloating will get  US transvaginal/pelvis/AB ? IBS- had normal colon 2014, normal CT AB- will give samples of linzess, follow up GI ? Muscular/neuropathy has DDD lumbar- will monitor, may need DEXA  Future Appointments Date Time Provider [place]  12/12/2014 9:00 AM Unk Pinto, MD [id number] None   Moderate

## 2014-11-22 ENCOUNTER — Ambulatory Visit (HOSPITAL_COMMUNITY): Payer: Self-pay

## 2014-11-22 LAB — CBC WITH DIFFERENTIAL/PLATELET
Basophils Absolute: 0 10*3/uL (ref 0.0–0.1)
Basophils Relative: 1 % (ref 0–1)
EOS ABS: 0.2 10*3/uL (ref 0.0–0.7)
Eosinophils Relative: 4 % (ref 0–5)
HEMATOCRIT: 39.1 % (ref 36.0–46.0)
Hemoglobin: 13 g/dL (ref 12.0–15.0)
LYMPHS ABS: 1.7 10*3/uL (ref 0.7–4.0)
LYMPHS PCT: 46 % (ref 12–46)
MCH: 28.3 pg (ref 26.0–34.0)
MCHC: 33.2 g/dL (ref 30.0–36.0)
MCV: 85 fL (ref 78.0–100.0)
MONO ABS: 0.3 10*3/uL (ref 0.1–1.0)
MONOS PCT: 8 % (ref 3–12)
MPV: 10.3 fL (ref 8.6–12.4)
NEUTROS ABS: 1.6 10*3/uL — AB (ref 1.7–7.7)
Neutrophils Relative %: 41 % — ABNORMAL LOW (ref 43–77)
Platelets: 205 10*3/uL (ref 150–400)
RBC: 4.6 MIL/uL (ref 3.87–5.11)
RDW: 13.5 % (ref 11.5–15.5)
WBC: 3.8 10*3/uL — AB (ref 4.0–10.5)

## 2014-11-22 LAB — URINALYSIS, ROUTINE W REFLEX MICROSCOPIC
Bilirubin Urine: NEGATIVE
Glucose, UA: NEGATIVE mg/dL
Hgb urine dipstick: NEGATIVE
Ketones, ur: NEGATIVE mg/dL
LEUKOCYTES UA: NEGATIVE
Nitrite: NEGATIVE
PH: 6 (ref 5.0–8.0)
Protein, ur: NEGATIVE mg/dL
Specific Gravity, Urine: <1.005 — ABNORMAL LOW (ref 1.005–1.030)
UROBILINOGEN UA: 0.2 mg/dL (ref 0.0–1.0)

## 2014-11-23 ENCOUNTER — Encounter: Payer: Self-pay | Admitting: Internal Medicine

## 2014-11-23 LAB — URINE CULTURE
COLONY COUNT: NO GROWTH
Organism ID, Bacteria: NO GROWTH

## 2014-11-24 ENCOUNTER — Ambulatory Visit (HOSPITAL_COMMUNITY)
Admission: RE | Admit: 2014-11-24 | Discharge: 2014-11-24 | Disposition: A | Discharge status: Home / Self Care | Payer: 59 | Source: Ambulatory Visit | Attending: Physician Assistant | Admitting: Physician Assistant

## 2014-11-24 DIAGNOSIS — R634 Abnormal weight loss: Secondary | ICD-10-CM | POA: Insufficient documentation

## 2014-11-24 DIAGNOSIS — R1032 Left lower quadrant pain: Secondary | ICD-10-CM | POA: Diagnosis not present

## 2014-11-24 DIAGNOSIS — R1031 Right lower quadrant pain: Secondary | ICD-10-CM

## 2014-12-12 ENCOUNTER — Encounter: Payer: Self-pay | Admitting: Internal Medicine

## 2014-12-12 ENCOUNTER — Ambulatory Visit (INDEPENDENT_AMBULATORY_CARE_PROVIDER_SITE_OTHER): Payer: 59 | Admitting: Internal Medicine

## 2014-12-12 VITALS — BP 94/52 | HR 60 | Temp 97.9°F | Resp 16 | Ht 64.0 in | Wt 131.2 lb

## 2014-12-12 DIAGNOSIS — Z1212 Encounter for screening for malignant neoplasm of rectum: Secondary | ICD-10-CM

## 2014-12-12 DIAGNOSIS — Z0001 Encounter for general adult medical examination with abnormal findings: Secondary | ICD-10-CM | POA: Insufficient documentation

## 2014-12-12 DIAGNOSIS — E785 Hyperlipidemia, unspecified: Secondary | ICD-10-CM

## 2014-12-12 DIAGNOSIS — R7309 Other abnormal glucose: Secondary | ICD-10-CM

## 2014-12-12 DIAGNOSIS — R030 Elevated blood-pressure reading, without diagnosis of hypertension: Secondary | ICD-10-CM

## 2014-12-12 DIAGNOSIS — Z79899 Other long term (current) drug therapy: Secondary | ICD-10-CM | POA: Insufficient documentation

## 2014-12-12 DIAGNOSIS — R5383 Other fatigue: Secondary | ICD-10-CM

## 2014-12-12 DIAGNOSIS — R7303 Prediabetes: Secondary | ICD-10-CM

## 2014-12-12 DIAGNOSIS — Z111 Encounter for screening for respiratory tuberculosis: Secondary | ICD-10-CM

## 2014-12-12 DIAGNOSIS — Z6822 Body mass index (BMI) 22.0-22.9, adult: Secondary | ICD-10-CM

## 2014-12-12 DIAGNOSIS — K219 Gastro-esophageal reflux disease without esophagitis: Secondary | ICD-10-CM

## 2014-12-12 DIAGNOSIS — E559 Vitamin D deficiency, unspecified: Secondary | ICD-10-CM

## 2014-12-12 DIAGNOSIS — IMO0001 Reserved for inherently not codable concepts without codable children: Secondary | ICD-10-CM

## 2014-12-12 LAB — MAGNESIUM: Magnesium: 2 mg/dL (ref 1.5–2.5)

## 2014-12-12 LAB — BASIC METABOLIC PANEL WITH GFR
BUN: 15 mg/dL (ref 6–23)
CALCIUM: 9.3 mg/dL (ref 8.4–10.5)
CO2: 24 mEq/L (ref 19–32)
Chloride: 102 mEq/L (ref 96–112)
Creat: 0.7 mg/dL (ref 0.50–1.10)
Glucose, Bld: 82 mg/dL (ref 70–99)
Potassium: 4 mEq/L (ref 3.5–5.3)
Sodium: 139 mEq/L (ref 135–145)

## 2014-12-12 LAB — HEMOGLOBIN A1C
HEMOGLOBIN A1C: 5.8 % — AB (ref <5.7)
Mean Plasma Glucose: 120 mg/dL — ABNORMAL HIGH (ref <117)

## 2014-12-12 LAB — HEPATIC FUNCTION PANEL
ALK PHOS: 74 U/L (ref 39–117)
ALT: 19 U/L (ref 0–35)
AST: 20 U/L (ref 0–37)
Albumin: 4.1 g/dL (ref 3.5–5.2)
BILIRUBIN INDIRECT: 0.3 mg/dL (ref 0.2–1.2)
Bilirubin, Direct: 0.1 mg/dL (ref 0.0–0.3)
Total Bilirubin: 0.4 mg/dL (ref 0.2–1.2)
Total Protein: 6.9 g/dL (ref 6.0–8.3)

## 2014-12-12 LAB — CBC WITH DIFFERENTIAL/PLATELET
Basophils Absolute: 0 10*3/uL (ref 0.0–0.1)
Basophils Relative: 1 % (ref 0–1)
EOS PCT: 2 % (ref 0–5)
Eosinophils Absolute: 0.1 10*3/uL (ref 0.0–0.7)
HEMATOCRIT: 39 % (ref 36.0–46.0)
HEMOGLOBIN: 13.1 g/dL (ref 12.0–15.0)
LYMPHS ABS: 1.5 10*3/uL (ref 0.7–4.0)
Lymphocytes Relative: 49 % — ABNORMAL HIGH (ref 12–46)
MCH: 28.4 pg (ref 26.0–34.0)
MCHC: 33.6 g/dL (ref 30.0–36.0)
MCV: 84.6 fL (ref 78.0–100.0)
MPV: 10.3 fL (ref 8.6–12.4)
Monocytes Absolute: 0.3 10*3/uL (ref 0.1–1.0)
Monocytes Relative: 9 % (ref 3–12)
Neutro Abs: 1.2 10*3/uL — ABNORMAL LOW (ref 1.7–7.7)
Neutrophils Relative %: 39 % — ABNORMAL LOW (ref 43–77)
Platelets: 203 10*3/uL (ref 150–400)
RBC: 4.61 MIL/uL (ref 3.87–5.11)
RDW: 13.6 % (ref 11.5–15.5)
WBC: 3.1 10*3/uL — ABNORMAL LOW (ref 4.0–10.5)

## 2014-12-12 LAB — LIPID PANEL
CHOL/HDL RATIO: 2.7 ratio
Cholesterol: 208 mg/dL — ABNORMAL HIGH (ref 0–200)
HDL: 78 mg/dL (ref >=46)
LDL CALC: 119 mg/dL — AB (ref 0–99)
Triglycerides: 55 mg/dL (ref <150)
VLDL: 11 mg/dL (ref 0–40)

## 2014-12-12 LAB — IRON AND TIBC
%SAT: 39 % (ref 20–55)
Iron: 105 ug/dL (ref 42–145)
TIBC: 268 ug/dL (ref 250–470)
UIBC: 163 ug/dL (ref 125–400)

## 2014-12-12 LAB — TSH: TSH: 1.295 u[IU]/mL (ref 0.350–4.500)

## 2014-12-12 LAB — VITAMIN B12: Vitamin B-12: 541 pg/mL (ref 211–911)

## 2014-12-12 MED ORDER — RANITIDINE HCL 300 MG PO TABS: ORAL_TABLET | ORAL | Status: DC

## 2014-12-12 NOTE — Patient Instructions (Signed)
Recommend Adult Low dose Aspirin or baby Aspirin 81 mg daily   To reduce risk of Colon Cancer 20 %,   Skin Cancer 26 % ,   Melanoma 46%   and   Pancreatic cancer 60%  ++++++++++++++++++  Vitamin D goal is between 70-100.   Please make sure that you are taking your Vitamin D as directed.   It is very important as a natural anti-inflammatory   helping hair, skin, and nails, as well as reducing stroke and heart attack risk.   It helps your bones and helps with mood.  It also decreases numerous cancer risks so please take it as directed.   Low Vit D is associated with a 200-300% higher risk for CANCER   and 200-300% higher risk for HEART   ATTACK  &  STROKE.    ......................................  It is also associated with higher death rate at younger ages,   autoimmune diseases like Rheumatoid arthritis, Lupus, Multiple Sclerosis.     Also many other serious conditions, like depression, Alzheimer's  Dementia, infertility, muscle aches, fatigue, fibromyalgia - just to name a few.  +++++++++++++++++++    Recommend the book "The END of DIETING" by Dr Joel Fuhrman   & the book "The END of DIABETES " by Dr Joel Fuhrman  At [employer] - get book & Audio CD's     Being diabetic has a  300% increased risk for heart attack, stroke, cancer, and alzheimer- type vascular dementia. It is very important that you work harder with diet by avoiding all foods that are white. Avoid white rice (brown & wild rice is OK), white potatoes (sweetpotatoes in moderation is OK), White bread or wheat bread or anything made out of white flour like bagels, donuts, rolls, buns, biscuits, cakes, pastries, cookies, pizza crust, and pasta (made from white flour & egg whites) - vegetarian pasta or spinach or wheat pasta is OK. Multigrain breads like Arnold's or [employer], or multigrain sandwich thins or flatbreads.  Diet, exercise and weight loss can reverse and cure diabetes in the early  stages.  Diet, exercise and weight loss is very important in the control and prevention of complications of diabetes which affects every system in your body, ie. Brain - dementia/stroke, eyes - glaucoma/blindness, heart - heart attack/heart failure, kidneys - dialysis, stomach - gastric paralysis, intestines - malabsorption, nerves - severe painful neuritis, circulation - gangrene & loss of a leg(s), and finally cancer and Alzheimers.    I recommend avoid fried & greasy foods,  sweets/candy, white rice (brown or wild rice or Quinoa is OK), white potatoes (sweet potatoes are OK) - anything made from white flour - bagels, doughnuts, rolls, buns, biscuits,white and wheat breads, pizza crust and traditional pasta made of white flour & egg white(vegetarian pasta or spinach or wheat pasta is OK).  Multi-grain bread is OK - like multi-grain flat bread or sandwich thins. Avoid alcohol in excess. Exercise is also important.    Eat all the vegetables you want - avoid meat, especially red meat and dairy - especially cheese.  Cheese is the most concentrated form of trans-fats which is the worst thing to clog up our arteries. Veggie cheese is OK which can be found in the fresh produce section at [employer] or [employer] or [employer]  ++++++++++++++++++++++++++  Preventive Care for Adults  A healthy lifestyle and preventive care can promote health and wellness. Preventive health guidelines for women include the following key practices.  A routine yearly physical is   a good way to check with your health care provider about your health and preventive screening. It is a chance to share any concerns and updates on your health and to receive a thorough exam.  Visit your dentist for a routine exam and preventive care every 6 months. Brush your teeth twice a day and floss once a day. Good oral hygiene prevents tooth decay and gum disease.  The frequency of eye exams is based on your age, health, family medical  history, use of contact lenses, and other factors. Follow your health care provider's recommendations for frequency of eye exams.  Eat a healthy diet. Foods like vegetables, fruits, whole grains, low-fat dairy products, and lean protein foods contain the nutrients you need without too many calories. Decrease your intake of foods high in solid fats, added sugars, and salt. Eat the right amount of calories for you.Get information about a proper diet from your health care provider, if necessary.  Regular physical exercise is one of the most important things you can do for your health. Most adults should get at least 150 minutes of moderate-intensity exercise (any activity that increases your heart rate and causes you to sweat) each week. In addition, most adults need muscle-strengthening exercises on 2 or more days a week.  Maintain a healthy weight. The body mass index (BMI) is a screening tool to identify possible weight problems. It provides an estimate of body fat based on height and weight. Your health care provider can find your BMI and can help you achieve or maintain a healthy weight.For adults 20 years and older:  A BMI below 18.5 is considered underweight.  A BMI of 18.5 to 24.9 is normal.  A BMI of 25 to 29.9 is considered overweight.  A BMI of 30 and above is considered obese.  Maintain normal blood lipids and cholesterol levels by exercising and minimizing your intake of saturated fat. Eat a balanced diet with plenty of fruit and vegetables. Blood tests for lipids and cholesterol should begin at age 69 and be repeated every 5 years. If your lipid or cholesterol levels are high, you are over 50, or you are at high risk for heart disease, you may need your cholesterol levels checked more frequently.Ongoing high lipid and cholesterol levels should be treated with medicines if diet and exercise are not working.  If you smoke, find out from your health care provider how to quit. If you do  not use tobacco, do not start.  Lung cancer screening is recommended for adults aged 53-80 years who are at high risk for developing lung cancer because of a history of smoking. A yearly low-dose CT scan of the lungs is recommended for people who have at least a 30-pack-year history of smoking and are a current smoker or have quit within the past 15 years. A pack year of smoking is smoking an average of 1 pack of cigarettes a day for 1 year (for example: 1 pack a day for 30 years or 2 packs a day for 15 years). Yearly screening should continue until the smoker has stopped smoking for at least 15 years. Yearly screening should be stopped for people who develop a health problem that would prevent them from having lung cancer treatment.  High blood pressure causes heart disease and increases the risk of stroke. Your blood pressure should be checked at least every 1 to 2 years. Ongoing high blood pressure should be treated with medicines if weight loss and exercise do not work.  If you are 61-95 years old, ask your health care provider if you should take aspirin to prevent strokes.  Diabetes screening involves taking a blood sample to check your fasting blood sugar level. This should be done once every 3 years, after age 37, if you are within normal weight and without risk factors for diabetes. Testing should be considered at a younger age or be carried out more frequently if you are overweight and have at least 1 risk factor for diabetes.  Breast cancer screening is essential preventive care for women. You should practice "breast self-awareness." This means understanding the normal appearance and feel of your breasts and may include breast self-examination. Any changes detected, no matter how small, should be reported to a health care provider. Women in their 86s and 30s should have a clinical breast exam (CBE) by a health care provider as part of a regular health exam every 1 to 3 years. After age 91, women  should have a CBE every year. Starting at age 41, women should consider having a mammogram (breast X-ray test) every year. Women who have a family history of breast cancer should talk to their health care provider about genetic screening. Women at a high risk of breast cancer should talk to their health care providers about having an MRI and a mammogram every year.  Breast cancer gene (BRCA)-related cancer risk assessment is recommended for women who have family members with BRCA-related cancers. BRCA-related cancers include breast, ovarian, tubal, and peritoneal cancers. Having family members with these cancers may be associated with an increased risk for harmful changes (mutations) in the breast cancer genes BRCA1 and BRCA2. Results of the assessment will determine the need for genetic counseling and BRCA1 and BRCA2 testing.  Routine pelvic exams to screen for cancer are no longer recommended for nonpregnant women who are considered low risk for cancer of the pelvic organs (ovaries, uterus, and vagina) and who do not have symptoms. Ask your health care provider if a screening pelvic exam is right for you.  If you have had past treatment for cervical cancer or a condition that could lead to cancer, you need Pap tests and screening for cancer for at least 20 years after your treatment. If Pap tests have been discontinued, your risk factors (such as having a new sexual partner) need to be reassessed to determine if screening should be resumed. Some women have medical problems that increase the chance of getting cervical cancer. In these cases, your health care provider may recommend more frequent screening and Pap tests.  Colorectal cancer can be detected and often prevented. Most routine colorectal cancer screening begins at the age of 67 years and continues through age 35 years. However, your health care provider may recommend screening at an earlier age if you have risk factors for colon cancer. On a  yearly basis, your health care provider may provide home test kits to check for hidden blood in the stool. Use of a small camera at the end of a tube, to directly examine the colon (sigmoidoscopy or colonoscopy), can detect the earliest forms of colorectal cancer. Talk to your health care provider about this at age 34, when routine screening begins. Direct exam of the colon should be repeated every 5-10 years through age 90 years, unless early forms of pre-cancerous polyps or small growths are found.  Hepatitis C blood testing is recommended for all people born from [birth year] through 1965 and any individual with known risks for hepatitis C.  Pra  Osteoporosis is a disease in which the bones lose minerals and strength with aging. This can result in serious bone fractures or breaks. The risk of osteoporosis can be identified using a bone density scan. Women ages 54 years and over and women at risk for fractures or osteoporosis should discuss screening with their health care providers. Ask your health care provider whether you should take a calcium supplement or vitamin D to reduce the rate of osteoporosis.  Menopause can be associated with physical symptoms and risks. Hormone replacement therapy is available to decrease symptoms and risks. You should talk to your health care provider about whether hormone replacement therapy is right for you.  Use sunscreen. Apply sunscreen liberally and repeatedly throughout the day. You should seek shade when your shadow is shorter than you. Protect yourself by wearing long sleeves, pants, a wide-brimmed hat, and sunglasses year round, whenever you are outdoors.  Once a month, do a whole body skin exam, using a mirror to look at the skin on your back. Tell your health care provider of new moles, moles that have irregular borders, moles that are larger than a pencil eraser, or moles that have changed in shape or color.  Stay current with required vaccines  (immunizations).  Influenza vaccine. All adults should be immunized every year.  Tetanus, diphtheria, and acellular pertussis (Td, Tdap) vaccine. Pregnant women should receive 1 dose of Tdap vaccine during each pregnancy. The dose should be obtained regardless of the length of time since the last dose. Immunization is preferred during the 27th-36th week of gestation. An adult who has not previously received Tdap or who does not know her vaccine status should receive 1 dose of Tdap. This initial dose should be followed by tetanus and diphtheria toxoids (Td) booster doses every 10 years. Adults with an unknown or incomplete history of completing a 3-dose immunization series with Td-containing vaccines should begin or complete a primary immunization series including a Tdap dose. Adults should receive a Td booster every 10 years.  Varicella vaccine. An adult without evidence of immunity to varicella should receive 2 doses or a second dose if she has previously received 1 dose. Pregnant females who do not have evidence of immunity should receive the first dose after pregnancy. This first dose should be obtained before leaving the health care facility. The second dose should be obtained 4-8 weeks after the first dose.  Human papillomavirus (HPV) vaccine. Females aged 13-26 years who have not received the vaccine previously should obtain the 3-dose series. The vaccine is not recommended for use in pregnant females. However, pregnancy testing is not needed before receiving a dose. If a female is found to be pregnant after receiving a dose, no treatment is needed. In that case, the remaining doses should be delayed until after the pregnancy. Immunization is recommended for any person with an immunocompromised condition through the age of 28 years if she did not get any or all doses earlier. During the 3-dose series, the second dose should be obtained 4-8 weeks after the first dose. The third dose should be obtained  24 weeks after the first dose and 16 weeks after the second dose.  Zoster vaccine. One dose is recommended for adults aged 27 years or older unless certain conditions are present.  Measles, mumps, and rubella (MMR) vaccine. Adults born before [birth year] generally are considered immune to measles and mumps. Adults born in [birth year] or later should have 1 or more doses of MMR vaccine unless there is a  contraindication to the vaccine or there is laboratory evidence of immunity to each of the three diseases. A routine second dose of MMR vaccine should be obtained at least 28 days after the first dose for students attending postsecondary schools, health care workers, or international travelers. People who received inactivated measles vaccine or an unknown type of measles vaccine during 1963-1967 should receive 2 doses of MMR vaccine. People who received inactivated mumps vaccine or an unknown type of mumps vaccine before 1979 and are at high risk for mumps infection should consider immunization with 2 doses of MMR vaccine. For females of childbearing age, rubella immunity should be determined. If there is no evidence of immunity, females who are not pregnant should be vaccinated. If there is no evidence of immunity, females who are pregnant should delay immunization until after pregnancy. Unvaccinated health care workers born before [birth year] who lack laboratory evidence of measles, mumps, or rubella immunity or laboratory confirmation of disease should consider measles and mumps immunization with 2 doses of MMR vaccine or rubella immunization with 1 dose of MMR vaccine.  Pneumococcal 13-valent conjugate (PCV13) vaccine. When indicated, a person who is uncertain of her immunization history and has no record of immunization should receive the PCV13 vaccine. An adult aged 3 years or older who has certain medical conditions and has not been previously immunized should receive 1 dose of PCV13 vaccine. This PCV13 should be followed  with a dose of pneumococcal polysaccharide (PPSV23) vaccine. The PPSV23 vaccine dose should be obtained at least 8 weeks after the dose of PCV13 vaccine. An adult aged 49 years or older who has certain medical conditions and previously received 1 or more doses of PPSV23 vaccine should receive 1 dose of PCV13. The PCV13 vaccine dose should be obtained 1 or more years after the last PPSV23 vaccine dose.    Pneumococcal polysaccharide (PPSV23) vaccine. When PCV13 is also indicated, PCV13 should be obtained first. All adults aged 81 years and older should be immunized. An adult younger than age 19 years who has certain medical conditions should be immunized. Any person who resides in a nursing home or long-term care facility should be immunized. An adult smoker should be immunized. People with an immunocompromised condition and certain other conditions should receive both PCV13 and PPSV23 vaccines. People with human immunodeficiency virus (HIV) infection should be immunized as soon as possible after diagnosis. Immunization during chemotherapy or radiation therapy should be avoided. Routine use of PPSV23 vaccine is not recommended for American Indians, [place] Natives, or people younger than 65 years unless there are medical conditions that require PPSV23 vaccine. When indicated, people who have unknown immunization and have no record of immunization should receive PPSV23 vaccine. One-time revaccination 5 years after the first dose of PPSV23 is recommended for people aged 19-64 years who have chronic kidney failure, nephrotic syndrome, asplenia, or immunocompromised conditions. People who received 1-2 doses of PPSV23 before age 50 years should receive another dose of PPSV23 vaccine at age 57 years or later if at least 5 years have passed since the previous dose. Doses of PPSV23 are not needed for people immunized with PPSV23 at or after age 48 years.  Preventive Services / Frequency   Ages 72 to 22 years  Blood  pressure check.  Lipid and cholesterol check.  Lung cancer screening. / Every year if you are aged 50-80 years and have a 30-pack-year history of smoking and currently smoke or have quit within the past 15 years. Yearly screening is stopped  once you have quit smoking for at least 15 years or develop a health problem that would prevent you from having lung cancer treatment.  Clinical breast exam.** / Every year after age 33 years.  BRCA-related cancer risk assessment.** / For women who have family members with a BRCA-related cancer (breast, ovarian, tubal, or peritoneal cancers).  Mammogram.** / Every year beginning at age 49 years and continuing for as long as you are in good health. Consult with your health care provider.  Pap test.** / Every 3 years starting at age 28 years through age 18 or 69 years with a history of 3 consecutive normal Pap tests.  HPV screening.** / Every 3 years from ages 65 years through ages 59 to 78 years with a history of 3 consecutive normal Pap tests.  Fecal occult blood test (FOBT) of stool. / Every year beginning at age 2 years and continuing until age 41 years. You may not need to do this test if you get a colonoscopy every 10 years.  Flexible sigmoidoscopy or colonoscopy.** / Every 5 years for a flexible sigmoidoscopy or every 10 years for a colonoscopy beginning at age 23 years and continuing until age 62 years.  Hepatitis C blood test.** / For all people born from [birth year] through 1964 and any individual with known risks for hepatitis C.  Skin self-exam. / Monthly.  Influenza vaccine. / Every year.  Tetanus, diphtheria, and acellular pertussis (Tdap/Td) vaccine.** / Consult your health care provider. Pregnant women should receive 1 dose of Tdap vaccine during each pregnancy. 1 dose of Td every 10 years.  Varicella vaccine.** / Consult your health care provider. Pregnant females who do not have evidence of immunity should receive the first dose after  pregnancy.  Zoster vaccine.** / 1 dose for adults aged 21 years or older.  Pneumococcal 13-valent conjugate (PCV13) vaccine.** / Consult your health care provider.  Pneumococcal polysaccharide (PPSV23) vaccine.** / 1 to 2 doses if you smoke cigarettes or if you have certain conditions.  Meningococcal vaccine.** / Consult your health care provider.  Hepatitis A vaccine.** / Consult your health care provider.  Hepatitis B vaccine.** / Consult your health care provider. Screening for abdominal aortic aneurysm (AAA)  by ultrasound is recommended for people over 50 who have history of high blood pressure or who are current or former smokers.

## 2014-12-12 NOTE — Progress Notes (Signed)
Patient ID: [name], female   DOB: 09-13-1950, 64 y.o.   MRN: [id number]  Annual Comprehensive Examination  This very nice 64 y.o. MBF presents for complete physical.  Patient has been followed for HTN, Prediabetes, Hyperlipidemia, and Vitamin D Deficiency. Patient was evaqluated recently for lower abdominal pain assymmetric to the left which she locates to the inguinal area(s). Abd, pelvis & vaginal U/S were unrevealing. Patient does have prior remote hx/o IBS & GERD. She denies any GERD or water brash type sx's and denies any association with food intake, food types or BM's.     Patient is screened and monitored expectantly for elevated BP.  Patient's BP has been controlled and patient denies any cardiac symptoms as chest pain, palpitations, shortness of breath, dizziness or ankle swelling. Today's BP is low at 92/54.     Patient's hyperlipidemia is not controlled with diet. Patient denies myalgias or other medication SE's. Last lipids were not at goal - Cholesterol 222; HDL 79; LDL 130; Triglycerides 64 on 08/09/2014.   Patient has prediabetes predating since 2014 with A1c's of 5.7% and 5.9%  and patient denies reactive hypoglycemic symptoms, visual blurring, diabetic polys, or paresthesias. Last A1c was  6.1% on 08/09/2014.   Finally, patient has history of Vitamin D Deficiency and last Vitamin D was 56 on 08/09/2014.    Medication Sig  . aspirin 81 MG tablet Take 81 mg by mouth daily.  .  5000 UNITS capsule Take 5,000 Units by mouth daily.  Marland Kitchen escitalopram  10 MG tablet Take 1 tablet (10 mg total) by mouth daily.  Marland Kitchen FLAXSEED OIL  Take by mouth daily.  . Lutein 20 MG TABS Take by mouth.  Marland Kitchen SLOW-MAG) 64 MG  Take 1 tablet by mouth daily.  . WOMENS MULTI VITAMIN & MINERAL Take 1 tablet by mouth daily.  Marland Kitchen FISH OIL Take by mouth daily.  . Probiotic / ALIGN 4 MG Take by mouth daily.  . ranitidine ( 300 MG tablet TAKE 1 TABLET BY MOUTH AT BEDTIME   Allergies  Allergen Reactions  . Bactrim  [Sulfamethoxazole-Trimethoprim] Other (See Comments)    Patient preference to take medication without sulfa   Past Medical History  Diagnosis Date  . GERD (gastroesophageal reflux disease)   . Prediabetes   . Depression   . Arthritis     cervical and lumbar  . Vitamin D deficiency   . Hyperlipidemia    Health Maintenance  Topic Date Due  . HIV Screening  04/18/1967  . INFLUENZA VACCINE  01/15/2015  . PAP SMEAR  11/19/2015  . MAMMOGRAM  01/04/2016  . TETANUS/TDAP  05/17/2017  . COLONOSCOPY  09/16/2022  . ZOSTAVAX  Completed   Immunization History  Administered Date(s) Administered  . Td 05/18/2007  . Zoster 05/17/2006   Past Surgical History  Procedure Laterality Date  . Cardiac catheterization  2011    normal (Dr. Terrence Dupont)   History  Substance Use Topics  . Smoking status: Never Smoker   . Smokeless tobacco: Never Used  . Alcohol Use: No    ROS Constitutional: Denies fever, chills, weight loss/gain, headaches, insomnia,  night sweats, and change in appetite. Does c/o fatigue. Eyes: Denies redness, blurred vision, diplopia, discharge, itchy, watery eyes.  ENT: Denies discharge, congestion, post nasal drip, epistaxis, sore throat, earache, hearing loss, dental pain, Tinnitus, Vertigo, Sinus pain, snoring.  Cardio: Denies chest pain, palpitations, irregular heartbeat, syncope, dyspnea, diaphoresis, orthopnea, PND, claudication, edema Respiratory: denies cough, dyspnea, DOE, pleurisy, hoarseness, laryngitis,  wheezing.  Gastrointestinal: Denies dysphagia, heartburn, reflux, water brash, nausea, vomiting, bloating, diarrhea, constipation, hematemesis, melena, hematochezia, jaundice, hemorrhoids. Genitourinary: Denies dysuria, frequency, urgency, nocturia, hesitancy, discharge, hematuria, flank pain Breast: Breast lumps, nipple discharge, bleeding.  Musculoskeletal: Denies arthralgia, myalgia, stiffness, Jt. Swelling, pain, limp, and strain/sprain. Denies falls. Skin: Denies  puritis, rash, hives, warts, acne, eczema, changing in skin lesion Neuro: No weakness, tremor, incoordination, spasms, paresthesia, pain Psychiatric: Denies confusion, memory loss, sensory loss. Denies Depression. Endocrine: Denies change in weight, skin, hair change, nocturia, and paresthesia, diabetic polys, visual blurring, hyper / hypo glycemic episodes.  Heme/Lymph: No excessive bleeding, bruising, enlarged lymph nodes.  Physical Exam  BP 94/52 m  Pulse 60  Temp 97.9 F   Resp 16  Ht 5\' 4"    Wt 131 lb 3.2 oz     BMI 22.51   General Appearance: Well nourished and in no apparent distress. Eyes: PERRLA, EOMs, conjunctiva no swelling or erythema, normal fundi and vessels. Sinuses: No frontal/maxillary tenderness ENT/Mouth: EACs patent / TMs  nl. Nares clear without erythema, swelling, mucoid exudates. Oral hygiene is good. No erythema, swelling, or exudate. Tongue normal, non-obstructing. Tonsils not swollen or erythematous. Hearing normal.  Neck: Supple, thyroid normal. No bruits, nodes or JVD. Respiratory: Respiratory effort normal.  BS equal and clear bilateral without rales, rhonci, wheezing or stridor. Cardio: Heart sounds are normal with regular rate and rhythm and no murmurs, rubs or gallops. Peripheral pulses are normal and equal bilaterally without edema. No aortic or femoral bruits. Chest: symmetric with normal excursions and percussion. Breasts: Symmetric, without lumps, nipple discharge, retractions, or fibrocystic changes.  Abdomen: Flat, soft, with bowl sounds. Nontender, no guarding, rebound, hernias, masses, or organomegaly.  Lymphatics: Non tender without lymphadenopathy.  Genitourinary:  Musculoskeletal: Full ROM all peripheral extremities, joint stability, 5/5 strength, and normal gait. Skin: Warm and dry without rashes, lesions, cyanosis, clubbing or  ecchymosis.  Neuro: Cranial nerves intact, reflexes equal bilaterally. Normal muscle tone, no cerebellar symptoms.  Sensation intact.  Pysch: Awake and oriented X 3, normal affect, Insight and Judgment appropriate.   Assessment and Plan  1. Elevated BP  - Microalbumin / creatinine urine ratio - EKG 12-Lead - [place], RETROPERITNL ABD,  LTD - TSH  2. Hyperlipidemia  - Lipid panel  3. Prediabetes  - Hemoglobin A1c - Insulin, random  4. Vitamin D deficiency  - Vit D  25 hydroxy   5. Gastroesophageal reflux disease   6. Screening for rectal cancer  - POC Hemoccult Bld/Stl  7. Other fatigue  - Vitamin B12 - Iron and TIBC  8. Medication management  - Urine Microscopic - CBC with Differential/Platelet - BASIC METABOLIC PANEL WITH GFR - Hepatic function panel - Magnesium   Continue prudent diet as discussed, weight control, BP monitoring, regular exercise, and medications. Discussed med's effects and SE's. Screening labs and tests as requested with regular follow-up as recommended.  Over 40 minutes of exam, counseling, chart review was performed.

## 2014-12-13 LAB — MICROALBUMIN / CREATININE URINE RATIO: CREATININE, URINE: 22.5 mg/dL

## 2014-12-13 LAB — URINALYSIS, MICROSCOPIC ONLY
BACTERIA UA: NONE SEEN
Casts: NONE SEEN
Crystals: NONE SEEN
Squamous Epithelial / LPF: NONE SEEN

## 2014-12-13 LAB — INSULIN, RANDOM: INSULIN: 3.6 u[IU]/mL (ref 2.0–19.6)

## 2014-12-13 LAB — VITAMIN D 25 HYDROXY (VIT D DEFICIENCY, FRACTURES): Vit D, 25-Hydroxy: 61 ng/mL (ref 30–100)

## 2015-01-10 ENCOUNTER — Encounter: Payer: Self-pay | Admitting: Physician Assistant

## 2015-01-10 ENCOUNTER — Ambulatory Visit (INDEPENDENT_AMBULATORY_CARE_PROVIDER_SITE_OTHER): Payer: Commercial Managed Care - HMO | Admitting: Physician Assistant

## 2015-01-10 VITALS — BP 94/60 | HR 88 | Temp 100.0°F | Resp 16 | Ht 64.0 in | Wt 131.0 lb

## 2015-01-10 DIAGNOSIS — J209 Acute bronchitis, unspecified: Secondary | ICD-10-CM

## 2015-01-10 MED ORDER — PREDNISONE 20 MG PO TABS: ORAL_TABLET | ORAL | Status: DC

## 2015-01-10 MED ORDER — PROMETHAZINE-DM 6.25-15 MG/5ML PO SYRP: 5.0000 mL | ORAL_SOLUTION | Freq: Four times a day (QID) | ORAL | Status: DC | PRN

## 2015-01-10 MED ORDER — AZITHROMYCIN 250 MG PO TABS: ORAL_TABLET | ORAL | Status: AC

## 2015-01-10 NOTE — Progress Notes (Signed)
Subjective:    Patient ID: [name], female    DOB: Mar 25, 1951, 64 y.o.   MRN: [id number]  HPI 64 y.o. nonsmoking AAF with history of GERD, preDM, HTN, chol presents with cough x 4 days. She has had chills without fever, muscle aches, cough with productive brown cough, sinus headache, sore throat. She has taken mucinex DM without relief.   Blood pressure 94/60, pulse 88, temperature 100 F (37.8 C), temperature source Temporal, resp. rate 16, height 5\' 4"  (1.626 m), weight 131 lb (59.421 kg), SpO2 94 %.  Current Outpatient Prescriptions on File Prior to Visit  Medication Sig Dispense Refill  . aspirin 81 MG tablet Take 81 mg by mouth daily.    . Cholecalciferol (D-3-5) 5000 UNITS capsule Take 5,000 Units by mouth daily.    Marland Kitchen escitalopram (LEXAPRO) 10 MG tablet Take 1 tablet (10 mg total) by mouth daily. 30 tablet 2  . Flaxseed, Linseed, (FLAXSEED OIL PO) Take by mouth daily.    . Lutein 20 MG TABS Take by mouth.    . magnesium chloride (SLOW-MAG) 64 MG TBEC Take 1 tablet by mouth daily.    . Multiple Vitamins-Minerals (WOMENS MULTI VITAMIN & MINERAL PO) Take 1 tablet by mouth daily.    . Omega-3 Fatty Acids (FISH OIL PO) Take by mouth daily.    . Probiotic Product (ALIGN) 4 MG CAPS Take by mouth daily.    . ranitidine (ZANTAC) 300 MG tablet Take 1 tablet at Bedtime for Acid Indigestion & Reflux 90 tablet 3  . RESTASIS 0.05 % ophthalmic emulsion instill 1 drop into both eyes twice a day  0   No current facility-administered medications on file prior to visit.   Past Medical History  Diagnosis Date  . GERD (gastroesophageal reflux disease)   . Prediabetes   . Depression   . Arthritis     cervical and lumbar  . Vitamin D deficiency   . Hyperlipidemia     Review of Systems  Constitutional: Positive for fatigue. Negative for fever and chills.  HENT: Positive for congestion, rhinorrhea, sinus pressure and sore throat. Negative for dental problem, ear discharge, ear pain,  nosebleeds, trouble swallowing and voice change.   Respiratory: Positive for cough, chest tightness, shortness of breath and wheezing.   Cardiovascular: Negative.   Gastrointestinal: Negative.   Genitourinary: Negative.   Musculoskeletal: Negative.   Neurological: Negative.        Objective:   Physical Exam  Constitutional: She appears well-developed and well-nourished.  HENT:  Head: Normocephalic and atraumatic.  Right Ear: External ear normal.  Nose: Right sinus exhibits maxillary sinus tenderness. Right sinus exhibits no frontal sinus tenderness. Left sinus exhibits maxillary sinus tenderness. Left sinus exhibits no frontal sinus tenderness.  Eyes: Conjunctivae and EOM are normal.  Neck: Normal range of motion. Neck supple.  Cardiovascular: Normal rate, regular rhythm, normal heart sounds and intact distal pulses.   Pulmonary/Chest: Effort normal and breath sounds normal. No respiratory distress. She has no wheezes.  Abdominal: Soft. Bowel sounds are normal.  Lymphadenopathy:    She has no cervical adenopathy.  Skin: Skin is warm and dry.      Assessment & Plan:  1. Acute bronchitis, unspecified organism [J20.9] Will hold the zpak and take if she is not getting better, increase fluids, rest, cont allergy pill - azithromycin (ZITHROMAX) 250 MG tablet; Take 2 tablets (500 mg) on  Day 1,  followed by 1 tablet (250 mg) once daily on Days 2 through  5.  Dispense: 6 each; Refill: 1 - predniSONE (DELTASONE) 20 MG tablet; 2 tablets daily for 3 days, 1 tablet daily for 4 days.  Dispense: 10 tablet; Refill: 0 - promethazine-dextromethorphan (PROMETHAZINE-DM) 6.25-15 MG/5ML syrup; Take 5 mLs by mouth 4 (four) times daily as needed.  Dispense: 240 mL; Refill: 1

## 2015-01-10 NOTE — Patient Instructions (Signed)
Please take the prednisone to help decrease inflammation and therefore decrease symptoms. Take it it with food to avoid GI upset. It can cause increased energy but on the other hand it can make it hard to sleep at night so please take it AT [place], it takes 8-12 hours to start working so it will NOT affect your sleeping if you take it at night with your food!!  If you are diabetic it will increase your sugars so decrease carbs and monitor your sugars closely.    I will give you a prescription for an antibiotic, but please only take it if you are not feeling better in 7-10 days.  Bronchitis is mostly caused by viruses and the antibiotic will do nothing.  PLEASE TRY TO DO OVER THE COUNTER TREATMENT AND/OR PREDNISONE FOR 5-7 DAYS AND IF YOU ARE NOT GETTING BETTER OR GETTING WORSE THEN YOU CAN START ON AN ANTIBIOTIC GIVEN.  Can take the prednisone AT NIGHT WITH DINNER, it take 8-12 hours to start working so it will NOT affect your sleeping if you take it at night with your food!! Take two pills the first night and 1 or two pill the second night and then 1 pill the other nights.    Rest and stay hydrated.  Make sure you drink plenty of fluids to make sure urine is clear when you urinate.  Water will help thin out mucous. - Take Mucinex DM- Maximum Strength over the counter to thin out and cough up the thick mucous.  Please follow directions on box. -Take Albuterol if prescribed.  Risk of antibiotic use: About 1 in 4 people who take antibiotics have side effects including stomach problems, dizziness, or rashes. Those problems clear up soon after stopping the drugs, but in rare cases antibiotics can cause severe allergic reaction. Over use of antibiotics also encourages the growth of bacteria that can't be controlled easily with drugs. That makes you more vunerable to antibiotic-resistant infections and undermines the benefits of antibiotics for others.   Waste of Money: Antibiotics often aren't  very expensive, but any money spent on unnecessary drugs is money down the drain.   When are antibiotics needed? Only when symptoms last longer than a week.  Start to improve but then worsen again  Please call the office or message through My Chart if you have any questions.   Acute Bronchitis Bronchitis is when the airways that extend from the windpipe into the lungs get red, puffy, and painful (inflamed). Bronchitis often causes thick spit (mucus) to develop. This leads to a cough. A cough is the most common symptom of bronchitis. In acute bronchitis, the condition usually begins suddenly and goes away over time (usually in 2 weeks). Smoking, allergies, and asthma can make bronchitis worse. Repeated episodes of bronchitis may cause more lung problems.  Most common cause of Bronchitis is viruses (rhinovirus, coronavirus, RSV).  Therefore, not requiring an antibiotic; as antibiotics only treat bacterial infections.  HOME CARE  Rest.  Drink enough fluids to keep your pee (urine) clear or pale yellow (unless you need to limit fluids as told by your doctor).  Only take over-the-counter or prescription medicines as told by your doctor.  Avoid smoking and secondhand smoke. These can make bronchitis worse. If you are a smoker, think about using nicotine gum or skin patches. Quitting smoking will help your lungs heal faster.  Reduce the chance of getting bronchitis again by:  Washing your hands often.  Avoiding people with cold  symptoms.  Trying not to touch your hands to your mouth, nose, or eyes.  Follow up with your doctor as told. GET HELP IF: Your symptoms do not improve after 1 week of treatment. Symptoms include:  Cough.  Fever.  Coughing up thick spit.  Body aches.  Chest congestion.  Chills.  Shortness of breath.  Sore throat. GET HELP RIGHT AWAY IF:   You have an increased fever.  You have chills.  You have severe shortness of breath.  You have bloody  thick spit (sputum).  You throw up (vomit) often.  You lose too much body fluid (dehydration).  You have a severe headache.  You faint. MAKE SURE YOU:   Understand these instructions.  Will watch your condition.  Will get help right away if you are not doing well or get worse. Document Released: 11/19/2007 Document Revised: 02/02/2013 Document Reviewed: 11/23/2012 [facility] Patient Information 2015 [place], [place]. This information is not intended to replace advice given to you by your health care provider. Make sure you discuss any questions you have with your health care provider.

## 2015-01-15 ENCOUNTER — Telehealth: Payer: Self-pay | Admitting: *Deleted

## 2015-01-15 NOTE — Telephone Encounter (Signed)
Pt called is having trouble with coughing said that she added Mucinex dm but as soon as 4hrs is up her coughing starts again. Asking if there is anything else she can try to help her coughing. Pt said it continues all day something to suppress her cough?  If so call in to [facility] [address]   & pt ask for a call with advise

## 2015-01-16 MED ORDER — BENZONATATE 100 MG PO CAPS: 100.0000 mg | ORAL_CAPSULE | Freq: Four times a day (QID) | ORAL | Status: DC | PRN

## 2015-01-16 NOTE — Telephone Encounter (Signed)
Patient aware.

## 2015-01-16 NOTE — Telephone Encounter (Signed)
Patient complaining of cough, continue mucinex DM, add allergy pill, increase fluids, and send in tessalon to [facility], if not better needs OV.

## 2015-02-06 ENCOUNTER — Other Ambulatory Visit: Payer: Self-pay | Admitting: *Deleted

## 2015-02-06 DIAGNOSIS — Z1212 Encounter for screening for malignant neoplasm of rectum: Secondary | ICD-10-CM

## 2015-02-06 LAB — POC HEMOCCULT BLD/STL (HOME/3-CARD/SCREEN)
FECAL OCCULT BLD: NEGATIVE
FECAL OCCULT BLD: NEGATIVE
FECAL OCCULT BLD: NEGATIVE

## 2015-04-06 ENCOUNTER — Ambulatory Visit (INDEPENDENT_AMBULATORY_CARE_PROVIDER_SITE_OTHER): Payer: Commercial Managed Care - HMO | Admitting: Internal Medicine

## 2015-04-06 ENCOUNTER — Encounter: Payer: Self-pay | Admitting: Internal Medicine

## 2015-04-06 VITALS — BP 96/54 | HR 60 | Temp 98.2°F | Resp 16 | Ht 64.0 in

## 2015-04-06 DIAGNOSIS — R109 Unspecified abdominal pain: Secondary | ICD-10-CM | POA: Diagnosis not present

## 2015-04-06 MED ORDER — CIPROFLOXACIN HCL 500 MG PO TABS: 500.0000 mg | ORAL_TABLET | Freq: Two times a day (BID) | ORAL | Status: AC

## 2015-04-06 NOTE — Progress Notes (Signed)
   Subjective:    Patient ID: [name], female    DOB: [birth year], 64 y.o.   MRN: [id number]  Flank Pain Associated symptoms include abdominal pain. Pertinent negatives include no dysuria or fever.  Patient presents to the office for evaluation of right sided flank pain that has been going on for the past couple weeks.  She reports that the pain is intermittent sharp stabbing that lasts for a short period of time and then it is gone.  She reports that it is painful with sitting.  She reports no pain with movement or activity.  She does sit a lot for her job.  She does have to urinate more frequently and urgently lately.  She reports that she has had a fair amount of urine upon using the bathroom.  She hasn't had a lot of urinary tract infections or kidney infections.  She has no history of kidney stones either.     Review of Systems  Constitutional: Negative for fever, chills and fatigue.  HENT: Negative for sore throat.   Gastrointestinal: Positive for nausea and abdominal pain. Negative for vomiting.  Genitourinary: Positive for urgency, frequency and flank pain. Negative for dysuria, hematuria and difficulty urinating.       Objective:   Physical Exam  Constitutional: She is oriented to person, place, and time. She appears well-developed and well-nourished. No distress.  HENT:  Head: Normocephalic.  Mouth/Throat: Oropharynx is clear and moist. No oropharyngeal exudate.  Eyes: Conjunctivae are normal. No scleral icterus.  Neck: Normal range of motion. Neck supple. No JVD present. No thyromegaly present.  Cardiovascular: Normal rate, regular rhythm, normal heart sounds and intact distal pulses.  Exam reveals no gallop and no friction rub.   No murmur heard. Pulmonary/Chest: Effort normal and breath sounds normal. No respiratory distress. She has no wheezes. She has no rales. She exhibits no tenderness.  Abdominal: Soft. Bowel sounds are normal. She exhibits no distension and no  mass. There is tenderness. There is CVA tenderness. There is no rigidity, no rebound, no guarding, no tenderness at McBurney's point and negative Murphy's sign.  Musculoskeletal: Normal range of motion.  Lymphadenopathy:    She has no cervical adenopathy.  Neurological: She is alert and oriented to person, place, and time.  Skin: Skin is warm and dry. She is not diaphoretic.  Psychiatric: She has a normal mood and affect. Her behavior is normal. Judgment and thought content normal.  Nursing note and vitals reviewed.   Filed Vitals:   04/06/15 0915  BP: 96/54  Pulse: 60  Temp: 98.2 F (36.8 C)  Resp: 16          Assessment & Plan:    1. Right flank pain -AZO -Cipro 500 BID x 10 days - Urinalysis, Routine w reflex microscopic (not at [facility]) - Culture, Urine

## 2015-04-06 NOTE — Patient Instructions (Signed)

## 2015-04-07 LAB — URINALYSIS, ROUTINE W REFLEX MICROSCOPIC
Bilirubin Urine: NEGATIVE
GLUCOSE, UA: NEGATIVE
Hgb urine dipstick: NEGATIVE
Ketones, ur: NEGATIVE
LEUKOCYTES UA: NEGATIVE
Nitrite: NEGATIVE
Protein, ur: NEGATIVE
SPECIFIC GRAVITY, URINE: 1.01 (ref 1.001–1.035)
pH: 5.5 (ref 5.0–8.0)

## 2015-04-07 LAB — URINE CULTURE
Colony Count: NO GROWTH
Organism ID, Bacteria: NO GROWTH

## 2015-04-13 ENCOUNTER — Encounter: Payer: Self-pay | Admitting: Internal Medicine

## 2015-04-13 ENCOUNTER — Ambulatory Visit (INDEPENDENT_AMBULATORY_CARE_PROVIDER_SITE_OTHER): Payer: Commercial Managed Care - HMO | Admitting: Internal Medicine

## 2015-04-13 VITALS — BP 98/60 | HR 60 | Temp 98.1°F | Resp 16 | Ht 64.0 in | Wt 134.2 lb

## 2015-04-13 DIAGNOSIS — N3281 Overactive bladder: Secondary | ICD-10-CM

## 2015-04-13 DIAGNOSIS — Z6823 Body mass index (BMI) 23.0-23.9, adult: Secondary | ICD-10-CM

## 2015-04-13 NOTE — Patient Instructions (Addendum)
Start Vesicare 5 mg - 1 tablet daily 1st week   May increase to 2 tablets (10 mg) daily   - Call in 2 - 3 week for Rx for 5 or 10 mg  +++++++++++++++++++++++++++++++++++++++++++++++  Overactive Bladder, Adult Overactive bladder is a group of urinary symptoms. With overactive bladder, you may suddenly feel the need to pass urine (urinate) right away. After feeling this sudden urge, you might also leak urine if you cannot get to the bathroom fast enough (urinary incontinence). These symptoms might interfere with your daily work or social activities. Overactive bladder symptoms may also wake you up at night. Overactive bladder affects the nerve signals between your bladder and your brain. Your bladder may get the signal to empty before it is full. Very sensitive muscles can also make your bladder squeeze too soon. CAUSES Many things can cause an overactive bladder. Possible causes include:  Urinary tract infection.  Infection of nearby tissues, such as the prostate.  Prostate enlargement.  Being pregnant with twins or more (multiples).  Surgery on the uterus or urethra.  Bladder stones, inflammation, or tumors.  Drinking too much caffeine or alcohol.  Certain medicines, especially those that you take to help your body get rid of extra fluid (diuretics) by increasing urine production.  Muscle or nerve weakness, especially from:  A spinal cord injury.  Stroke.  Multiple sclerosis.  Parkinson disease.  Diabetes. This can cause a high urine volume that fills the bladder so quickly that the normal urge to urinate is triggered very strongly.  Constipation. A buildup of too much stool can put pressure on your bladder. RISK FACTORS You may be at greater risk for overactive bladder if you:  Are an older adult.  Smoke.  Are going through menopause.  Have prostate problems.  Have a neurological disease, such as stroke, dementia, Parkinson disease, or multiple sclerosis  (MS).  Eat or drink things that irritate the bladder. These include alcohol, spicy food, and caffeine.  Are overweight or obese. SIGNS AND SYMPTOMS  The signs and symptoms of an overactive bladder include:  Sudden, strong urges to urinate.  Leaking urine.  Urinating eight or more times per day.  Waking up to urinate two or more times per night. DIAGNOSIS Your health care provider may suspect overactive bladder based on your symptoms. The health care provider will do a physical exam and take your medical history. Blood or urine tests may also be done. For example, you might need to have a bladder function test to check how well you can hold your urine. You might also need to see a health care provider who specializes in the urinary tract (urologist). TREATMENT Treatment for overactive bladder depends on the cause of your condition and whether it is mild or severe. Certain treatments can be done in your health care provider's office or clinic. You can also make lifestyle changes at home. Options include: Behavioral Treatments  Biofeedback. A specialist uses sensors to help you become aware of your body's signals.  Keeping a daily log of when you need to urinate and what happens after the urge. This may help you manage your condition.  Bladder training. This helps you learn to control the urge to urinate by following a schedule that directs you to urinate at regular intervals (timed voiding). At first, you might have to wait a few minutes after feeling the urge. In time, you should be able to schedule bathroom visits an hour or more apart.  Kegel exercises. These  are exercises to strengthen the pelvic floor muscles, which support the bladder. Toning these muscles can help you control urination, even if your bladder muscles are overactive. A specialist will teach you how to do these exercises correctly. They require daily practice.  Weight loss. If you are obese or overweight, losing weight  might relieve your symptoms of overactive bladder. Talk to your health care provider about losing weight and whether there is a specific program or method that would work best for you.  Diet change. This might help if constipation is making your overactive bladder worse. Your health care provider or a dietitian can explain ways to change what you eat to ease constipation. You might also need to consume less alcohol and caffeine or drink other fluids at different times of the day.  Stopping smoking.  Wearing pads to absorb leakage while you wait for other treatments to take effect. Physical Treatments  Electrical stimulation. Electrodes send gentle pulses of electricity to strengthen the nerves or muscles that help to control the bladder. Sometimes, the electrodes are placed outside of the body. In other cases, they might be placed inside the body (implanted). This treatment can take several months to have an effect.  Supportive devices. Women may need a plastic device that fits into the vagina and supports the bladder (pessary). Medicines Several medicines can help treat overactive bladder and are usually used along with other treatments. Some are injected into the muscles involved in urination. Others come in pill form. Your health care provider may prescribe:  Antispasmodics. These medicines block the signals that the nerves send to the bladder. This keeps the bladder from releasing urine at the wrong time.  Tricyclic antidepressants. These types of antidepressants also relax bladder muscles. Surgery  You may have a device implanted to help manage the nerve signals that indicate when you need to urinate.  You may have surgery to implant electrodes for electrical stimulation.  Sometimes, very severe cases of overactive bladder require surgery to change the shape of the bladder. HOME CARE INSTRUCTIONS   Take medicines only as directed by your health care provider.  Use any implants or a  pessary as directed by your health care provider.  Make any diet or lifestyle changes that are recommended by your health care provider. These might include:  Drinking less fluid or drinking at different times of the day. If you need to urinate often during the night, you may need to stop drinking fluids early in the evening.  Cutting down on caffeine or alcohol. Both can make an overactive bladder worse. Caffeine is found in coffee, tea, and sodas.  Doing Kegel exercises to strengthen muscles.  Losing weight if you need to.  Eating a healthy and balanced diet to prevent constipation.  Keep a journal or log to track how much and when you drink and also when you feel the need to urinate. This will help your health care provider to monitor your condition. SEEK MEDICAL CARE IF:  Your symptoms do not get better after treatment.  Your pain and discomfort are getting worse.  You have more frequent urges to urinate.  You have a fever. SEEK IMMEDIATE MEDICAL CARE IF: You are not able to control your bladder at all.

## 2015-04-14 ENCOUNTER — Encounter: Payer: Self-pay | Admitting: Internal Medicine

## 2015-04-14 DIAGNOSIS — Z6824 Body mass index (BMI) 24.0-24.9, adult: Secondary | ICD-10-CM | POA: Insufficient documentation

## 2015-04-14 NOTE — Progress Notes (Signed)
  Subjective:    Patient ID: [name], female    DOB: [birth year], 64 y.o.   MRN: [id number]  HPI   This very nice 64 yo MBF was seen 1 week ago with sx's & exam suspect for lower UTI and started on cipro til Culture which returned negative. She persists with c/o low abdominal suprapubic discomfort and c/o urinary frequency & urgency and occasional urge incontinence. Reports nocturia 3-5 x and daytime frequency of 5-8 x.      She states she went for [facility] at [facility], but was  Told by the tech that as she c/o occas "itching" along her lower bra line that she would have to have a Diagnostic MGM. She denies any rash or skin changes or any palpable abnormalities as tenderness or lump and she denies nipple discharge.   Medication Sig  . aspirin 81 MG tablet Take 81 mg by mouth daily.  . Vitamin  D 5000 UNITS capsule Take 5,000 Units by mouth daily.  Marland Kitchen escitalopram  10 MG tablet Take 1 tablet (10 mg total) by mouth daily.  Marland Kitchen FLAXSEED OIL Take by mouth daily.  . Lutein 20 MG TABS Take by mouth.  Marland Kitchen SLOW-MAG)64 MG  Take 1 tablet by mouth daily.  . WOMENS MULTI VITAMIN & MINERAL  Take 1 tablet by mouth daily.  Marland Kitchen FISH OIL  Take by mouth daily.  . Probiotic Product (ALIGN) 4 MG CAPS Take by mouth daily.  . ranitidine300 MG tablet Take 1 tablet at Bedtime for Acid Indigestion & Reflux  . RESTASIS 0.05 % ophthalmic emulsion instill 1 drop into both eyes twice a day   Allergies  Allergen Reactions  . Bactrim [Sulfamethoxazole-Trimethoprim] Other (See Comments)    Patient preference to take medication without sulfa   Past Medical History  Diagnosis Date  . GERD (gastroesophageal reflux disease)   . Prediabetes   . Depression   . Arthritis     cervical and lumbar  . Vitamin D deficiency   . Hyperlipidemia    Review of Systems   10 point systems review negative except as above.    Objective:   Physical Exam  BP 98/60 mmHg  Pulse 60  Temp(Src) 98.1 F (36.7 C)  Resp 16  Ht 5\' 4"  (1.626 m)   Wt 134 lb 3.2 oz (60.873 kg)  BMI 23.02 kg/m2  HEENT - Eac's patent. TM's Nl. EOM's full. PERRLA. NasoOroPharynx clear. Neck - supple. Nl Thyroid. Carotids 2+ & No bruits, nodes, JVD Chest - Clear equal BS w/o Rales, rhonchi, wheezes. Cor - Nl HS. RRR w/o sig MGR. PP 1(+). No edema. Abd - No palpable organomegaly, masses with slight suprapubic tenderness w/o guarding or RB. BS nl. MS- FROM w/o deformities. Muscle power, tone and bulk Nl. Gait Nl. Neuro - No obvious Cr N abnormalities. Sensory, motor and Cerebellar functions appear Nl w/o focal abnormalities. Psyche - Mental status normal & appropriate.  No delusions, ideations or obvious mood abnormalities.    Assessment & Plan:   1. OAB (overactive bladder)  - Given sx's Vesicare 5 mg #21 - to begin 1 qd x 1st week , then increase up to 2 tabs qd as needed and call in 2-3 weeks for Rx if better. Discussed meds/SE's.

## 2015-05-02 ENCOUNTER — Other Ambulatory Visit: Payer: Self-pay | Admitting: *Deleted

## 2015-05-02 MED ORDER — SOLIFENACIN SUCCINATE 5 MG PO TABS: 5.0000 mg | ORAL_TABLET | Freq: Every day | ORAL | Status: DC

## 2015-05-03 ENCOUNTER — Other Ambulatory Visit: Payer: Self-pay | Admitting: *Deleted

## 2015-05-03 MED ORDER — TOLTERODINE TARTRATE ER 4 MG PO CP24: 4.0000 mg | ORAL_CAPSULE | Freq: Every day | ORAL | Status: DC

## 2015-05-18 ENCOUNTER — Ambulatory Visit (INDEPENDENT_AMBULATORY_CARE_PROVIDER_SITE_OTHER): Payer: Commercial Managed Care - HMO | Admitting: Internal Medicine

## 2015-05-18 ENCOUNTER — Encounter: Payer: Self-pay | Admitting: Internal Medicine

## 2015-05-18 VITALS — BP 100/64 | HR 68 | Temp 97.7°F | Resp 16 | Ht 64.0 in | Wt 130.2 lb

## 2015-05-18 DIAGNOSIS — J069 Acute upper respiratory infection, unspecified: Secondary | ICD-10-CM

## 2015-05-18 MED ORDER — BENZONATATE 200 MG PO CAPS: 200.0000 mg | ORAL_CAPSULE | Freq: Three times a day (TID) | ORAL | Status: DC | PRN

## 2015-05-18 MED ORDER — PREDNISONE 20 MG PO TABS: ORAL_TABLET | ORAL | Status: DC

## 2015-05-18 MED ORDER — FLUTICASONE PROPIONATE 50 MCG/ACT NA SUSP: 2.0000 | Freq: Every day | NASAL | Status: DC

## 2015-05-18 NOTE — Patient Instructions (Signed)
Please take the prednisone until it is gone.  Please take zyrtec daily.  Avoid the zyrtec D.  Please take mucinex every twelve hours.  Drink plenty of water to activate it.  Please use flonase two sprays per nostril nightly before bedtime.  Remember to spray to the crown of your head or out towards your ears.    Please take your zantac nightly even if you don't have heartburn.   Please let me know in 2 weeks if you have no improvement and I will send you to a GI doctor.

## 2015-05-18 NOTE — Progress Notes (Signed)
Patient ID: [name], female   DOB: [birth year], 64 y.o.   MRN: [id number]  HPI  Patient presents to the office for evaluation of cough.  It has been going on for 1 months.  Patient reports all the time, dry, worse with lying down.  They also endorse change in voice, chills, postnasal drip and nasal congestion, sore throat, nasal congestion.  .  They have tried mucinex dm and zyrtec d.  They report that nothing has worked.  They denies other sick contacts.  Review of Systems  Constitutional: Negative for fever, chills and malaise/fatigue.  HENT: Positive for congestion and sore throat. Negative for ear pain.   Eyes: Negative for redness.  Respiratory: Positive for cough. Negative for sputum production, shortness of breath and wheezing.   Cardiovascular: Negative for chest pain, palpitations and leg swelling.  Gastrointestinal: Positive for heartburn and nausea.  Skin: Negative.   Neurological: Negative for headaches.    PE: Filed Vitals:   05/18/15 1137  BP: 100/64  Pulse: 68  Temp: 97.7 F (36.5 C)  Resp: 16     General:  Alert and non-toxic, WDWN, NAD HEENT: NCAT, PERLA, EOM normal, no occular discharge or erythema.  Nasal mucosal edema with sinus tenderness to palpation.  Oropharynx clear with minimal oropharyngeal edema and erythema.  Mucous membranes moist and pink. Neck:  Cervical adenopathy Chest:  RRR no MRGs.  Lungs clear to auscultation A&P with no wheezes rhonchi or rales.   Abdomen: +BS x 4 quadrants, soft, non-tender, no guarding, rigidity, or rebound. Skin: warm and dry no rash Neuro: A&Ox4, CN II-XII grossly intact  Assessment and Plan:   1. Acute URI  -prednisone -flonase -zyrtec -nasal saline -zantac daily

## 2015-05-28 ENCOUNTER — Telehealth: Payer: Self-pay | Admitting: Internal Medicine

## 2015-05-28 NOTE — Telephone Encounter (Signed)
Patient called and said that a referral to a Nutritionist was discussed at her last office visit for Diabetes Education. States she is struggling with what she can eat.  Please advise your recommendation.  Thank you, Leonie Douglas Referral Coordinator  [facility] [phone] ext. [phone] Fax [phone]

## 2015-05-31 ENCOUNTER — Other Ambulatory Visit: Payer: Self-pay | Admitting: Gastroenterology

## 2015-05-31 DIAGNOSIS — R110 Nausea: Secondary | ICD-10-CM

## 2015-06-03 ENCOUNTER — Emergency Department (HOSPITAL_COMMUNITY)
Admission: EM | Admit: 2015-06-03 | Discharge: 2015-06-03 | Disposition: A | Discharge status: Home / Self Care | Payer: Commercial Managed Care - HMO | Attending: Emergency Medicine | Admitting: Emergency Medicine

## 2015-06-03 ENCOUNTER — Encounter (HOSPITAL_COMMUNITY): Payer: Self-pay | Admitting: *Deleted

## 2015-06-03 ENCOUNTER — Emergency Department (HOSPITAL_COMMUNITY): Payer: Commercial Managed Care - HMO

## 2015-06-03 DIAGNOSIS — M47812 Spondylosis without myelopathy or radiculopathy, cervical region: Secondary | ICD-10-CM | POA: Insufficient documentation

## 2015-06-03 DIAGNOSIS — R079 Chest pain, unspecified: Secondary | ICD-10-CM | POA: Diagnosis not present

## 2015-06-03 DIAGNOSIS — Z7982 Long term (current) use of aspirin: Secondary | ICD-10-CM | POA: Diagnosis not present

## 2015-06-03 DIAGNOSIS — M47816 Spondylosis without myelopathy or radiculopathy, lumbar region: Secondary | ICD-10-CM | POA: Diagnosis not present

## 2015-06-03 DIAGNOSIS — E559 Vitamin D deficiency, unspecified: Secondary | ICD-10-CM | POA: Insufficient documentation

## 2015-06-03 DIAGNOSIS — Z79899 Other long term (current) drug therapy: Secondary | ICD-10-CM | POA: Insufficient documentation

## 2015-06-03 DIAGNOSIS — E785 Hyperlipidemia, unspecified: Secondary | ICD-10-CM | POA: Diagnosis not present

## 2015-06-03 DIAGNOSIS — K219 Gastro-esophageal reflux disease without esophagitis: Secondary | ICD-10-CM | POA: Insufficient documentation

## 2015-06-03 DIAGNOSIS — Z7951 Long term (current) use of inhaled steroids: Secondary | ICD-10-CM | POA: Diagnosis not present

## 2015-06-03 DIAGNOSIS — F329 Major depressive disorder, single episode, unspecified: Secondary | ICD-10-CM | POA: Insufficient documentation

## 2015-06-03 LAB — BASIC METABOLIC PANEL
ANION GAP: 9 (ref 5–15)
BUN: 12 mg/dL (ref 6–20)
CO2: 29 mmol/L (ref 22–32)
Calcium: 9.2 mg/dL (ref 8.9–10.3)
Chloride: 103 mmol/L (ref 101–111)
Creatinine, Ser: 0.76 mg/dL (ref 0.44–1.00)
GFR calc non Af Amer: >60 mL/min (ref >60)
GLUCOSE: 100 mg/dL — AB (ref 65–99)
Potassium: 3.5 mmol/L (ref 3.5–5.1)
SODIUM: 141 mmol/L (ref 135–145)

## 2015-06-03 LAB — CBC
HEMATOCRIT: 40.2 % (ref 36.0–46.0)
Hemoglobin: 13.4 g/dL (ref 12.0–15.0)
MCH: 28.6 pg (ref 26.0–34.0)
MCHC: 33.3 g/dL (ref 30.0–36.0)
MCV: 85.9 fL (ref 78.0–100.0)
Platelets: 211 10*3/uL (ref 150–400)
RBC: 4.68 MIL/uL (ref 3.87–5.11)
RDW: 13.4 % (ref 11.5–15.5)
WBC: 4.6 10*3/uL (ref 4.0–10.5)

## 2015-06-03 LAB — I-STAT TROPONIN, ED: Troponin i, poc: 0 ng/mL (ref 0.00–0.08)

## 2015-06-03 NOTE — ED Provider Notes (Signed)
CSN: [id number]     Arrival date & time 06/03/15  1311 History   First MD Initiated Contact with Patient 06/03/15 1350     Chief Complaint  Patient presents with  . Chest Pain     (Consider location/radiation/quality/duration/timing/severity/associated sxs/prior Treatment) HPI Comments: Patient is a 64 year old female with history of GERD, depression. She presents for evaluation of chest discomfort. She reports being at church when her symptoms began. She was listening to the sermon and began to feel tightness in the front of her chest which radiated to her left arm. She felt initially like she was having difficulty taking a deep breath. She now presents to be evaluated for this. She denies any nausea or diaphoresis. She denies any recent exertional symptoms.  This patient has no prior cardiac history, however has undergone a heart cath by Dr. Terrence Dupont. This was performed approximately 2 years ago and revealed no significant stenoses.  Patient is a 64 y.o. female presenting with chest pain. The history is provided by the patient.  Chest Pain Pain location:  Substernal area Pain quality: pressure   Pain radiates to:  L arm Pain radiates to the back: no   Pain severity:  Moderate Onset quality:  Sudden Duration:  90 minutes Timing:  Constant Progression:  Resolved Chronicity:  New Relieved by:  Nothing Worsened by:  Nothing tried Ineffective treatments:  None tried   Past Medical History  Diagnosis Date  . GERD (gastroesophageal reflux disease)   . Prediabetes   . Depression   . Arthritis     cervical and lumbar  . Vitamin D deficiency   . Hyperlipidemia    Past Surgical History  Procedure Laterality Date  . Cardiac catheterization  2011    normal (Dr. Terrence Dupont)   No family history on file. Social History  Substance Use Topics  . Smoking status: Never Smoker   . Smokeless tobacco: Never Used  . Alcohol Use: No   OB History    No data available     Review of  Systems  Cardiovascular: Positive for chest pain.  All other systems reviewed and are negative.     Allergies  Bactrim  Home Medications   Prior to Admission medications   Medication Sig Start Date End Date Taking? Authorizing Provider  aspirin 81 MG tablet Take 81 mg by mouth daily.    Historical Provider, MD  benzonatate (TESSALON) 200 MG capsule Take 1 capsule (200 mg total) by mouth 3 (three) times daily as needed for cough. 05/18/15   Courtney Forcucci, PA-C  Cholecalciferol (D-3-5) 5000 UNITS capsule Take 5,000 Units by mouth daily.    Historical Provider, MD  escitalopram (LEXAPRO) 10 MG tablet Take 1 tablet (10 mg total) by mouth daily. 11/21/14 11/21/15  Vicie Mutters, PA-C  Flaxseed, Linseed, (FLAXSEED OIL PO) Take by mouth daily.    Historical Provider, MD  fluticasone (FLONASE) 50 MCG/ACT nasal spray Place 2 sprays into both nostrils daily. 05/18/15   Courtney Forcucci, PA-C  Lutein 20 MG TABS Take by mouth.    Historical Provider, MD  magnesium chloride (SLOW-MAG) 64 MG TBEC Take 1 tablet by mouth daily.    Historical Provider, MD  Multiple Vitamins-Minerals (WOMENS MULTI VITAMIN & MINERAL PO) Take 1 tablet by mouth daily.    Historical Provider, MD  Omega-3 Fatty Acids (FISH OIL PO) Take by mouth daily.    Historical Provider, MD  predniSONE (DELTASONE) 20 MG tablet 3 tabs po daily x 3 days, then 2 tabs x  3 days, then 1.5 tabs x 3 days, then 1 tab x 3 days, then 0.5 tabs x 3 days 05/18/15   Starlyn Skeans, PA-C  Probiotic Product (ALIGN) 4 MG CAPS Take by mouth daily.    Historical Provider, MD  ranitidine (ZANTAC) 300 MG tablet Take 1 tablet at Bedtime for Acid Indigestion & Reflux 12/12/14 12/12/15  Unk Pinto, MD  RESTASIS 0.05 % ophthalmic emulsion instill 1 drop into both eyes twice a day 11/08/14   Historical Provider, MD  tolterodine (DETROL LA) 4 MG 24 hr capsule Take 1 capsule (4 mg total) by mouth daily. 05/03/15   Unk Pinto, MD   BP 111/56 mmHg  Pulse 67   Temp(Src) 98 F (36.7 C) (Oral)  Resp 18  SpO2 100% Physical Exam  Constitutional: She is oriented to person, place, and time. She appears well-developed and well-nourished. No distress.  HENT:  Head: Normocephalic and atraumatic.  Neck: Normal range of motion. Neck supple.  Cardiovascular: Normal rate and regular rhythm.  Exam reveals no gallop and no friction rub.   No murmur heard. Pulmonary/Chest: Effort normal and breath sounds normal. No respiratory distress. She has no wheezes.  Abdominal: Soft. Bowel sounds are normal. She exhibits no distension. There is no tenderness.  Musculoskeletal: Normal range of motion.  Neurological: She is alert and oriented to person, place, and time.  Skin: Skin is warm and dry. She is not diaphoretic.  Nursing note and vitals reviewed.   ED Course  Procedures (including critical care time) Labs Review Labs Reviewed  BASIC METABOLIC PANEL  [place], ED    Imaging Review No results found. I have personally reviewed and evaluated these images and lab results as part of my medical decision-making.   EKG Interpretation   Date/Time:  Sunday June 03 2015 13:27:57 EST Ventricular Rate:  62 PR Interval:  144 QRS Duration: 85 QT Interval:  389 QTC Calculation: 395 R Axis:   69 Text Interpretation:  Sinus rhythm Normal ECG Confirmed by Cynia Abruzzo  MD,  Ryun Velez ([id number]) on 06/03/2015 2:54:48 PM      MDM   Final diagnoses:  None    Patient is a 64 year old female who presents with chest discomfort that started while at church. This lasted approximately 90 minutes, then spontaneously resolved. She has no prior history of coronary artery disease and has had a normal cath in the past as well as unremarkable stress tests. I highly doubt that this is cardiac in nature. Her EKG is normal and troponin is negative. Due to the normal heart cath and prior negative cardiac workups, I feel as though she is appropriate for discharge. She is a  follow-up with Dr. Terrence Dupont, and return to the ER if symptoms worsen.    Veryl Speak, MD 06/03/15 [phone]

## 2015-06-03 NOTE — Discharge Instructions (Signed)
Return to the emergency department if symptoms worsen or change.  Follow-up with your cardiologist this week.

## 2015-06-03 NOTE — ED Notes (Signed)
Pt comes from church with c/o chest pain with diaphoresis, and SOB

## 2015-06-04 ENCOUNTER — Telehealth: Payer: Self-pay | Admitting: Internal Medicine

## 2015-06-04 ENCOUNTER — Other Ambulatory Visit: Payer: Self-pay | Admitting: Internal Medicine

## 2015-06-04 DIAGNOSIS — E119 Type 2 diabetes mellitus without complications: Secondary | ICD-10-CM

## 2015-06-04 NOTE — Telephone Encounter (Signed)
2nd request for referral to [facility] for Diabetes Education.

## 2015-06-07 ENCOUNTER — Ambulatory Visit (HOSPITAL_COMMUNITY): Payer: Commercial Managed Care - HMO

## 2015-06-26 ENCOUNTER — Encounter: Payer: Self-pay | Admitting: Internal Medicine

## 2015-06-26 ENCOUNTER — Ambulatory Visit (INDEPENDENT_AMBULATORY_CARE_PROVIDER_SITE_OTHER): Payer: Commercial Managed Care - HMO | Admitting: Internal Medicine

## 2015-06-26 VITALS — BP 102/58 | HR 62 | Temp 98.0°F | Resp 16 | Ht 64.0 in | Wt 131.0 lb

## 2015-06-26 DIAGNOSIS — R030 Elevated blood-pressure reading, without diagnosis of hypertension: Secondary | ICD-10-CM

## 2015-06-26 DIAGNOSIS — E559 Vitamin D deficiency, unspecified: Secondary | ICD-10-CM | POA: Diagnosis not present

## 2015-06-26 DIAGNOSIS — Z79899 Other long term (current) drug therapy: Secondary | ICD-10-CM | POA: Diagnosis not present

## 2015-06-26 DIAGNOSIS — R109 Unspecified abdominal pain: Secondary | ICD-10-CM | POA: Diagnosis not present

## 2015-06-26 DIAGNOSIS — IMO0001 Reserved for inherently not codable concepts without codable children: Secondary | ICD-10-CM

## 2015-06-26 DIAGNOSIS — E785 Hyperlipidemia, unspecified: Secondary | ICD-10-CM

## 2015-06-26 DIAGNOSIS — R7303 Prediabetes: Secondary | ICD-10-CM | POA: Diagnosis not present

## 2015-06-26 LAB — HEPATIC FUNCTION PANEL
ALBUMIN: 3.8 g/dL (ref 3.6–5.1)
ALT: 18 U/L (ref 6–29)
AST: 19 U/L (ref 10–35)
Alkaline Phosphatase: 74 U/L (ref 33–130)
Bilirubin, Direct: 0.1 mg/dL (ref <=0.2)
Indirect Bilirubin: 0.4 mg/dL (ref 0.2–1.2)
TOTAL PROTEIN: 6.6 g/dL (ref 6.1–8.1)
Total Bilirubin: 0.5 mg/dL (ref 0.2–1.2)

## 2015-06-26 LAB — BASIC METABOLIC PANEL WITH GFR
BUN: 14 mg/dL (ref 7–25)
CALCIUM: 9.6 mg/dL (ref 8.6–10.4)
CO2: 28 mmol/L (ref 20–31)
Chloride: 103 mmol/L (ref 98–110)
Creat: 0.72 mg/dL (ref 0.50–0.99)
GFR, Est African American: >89 mL/min (ref >=60)
GFR, Est Non African American: 89 mL/min (ref >=60)
GLUCOSE: 62 mg/dL — AB (ref 65–99)
Potassium: 4.3 mmol/L (ref 3.5–5.3)
Sodium: 139 mmol/L (ref 135–146)

## 2015-06-26 LAB — LIPID PANEL
CHOL/HDL RATIO: 2.6 ratio (ref <=5.0)
Cholesterol: 210 mg/dL — ABNORMAL HIGH (ref 125–200)
HDL: 82 mg/dL (ref >=46)
LDL Cholesterol: 120 mg/dL (ref <130)
Triglycerides: 42 mg/dL (ref <150)
VLDL: 8 mg/dL (ref <30)

## 2015-06-26 LAB — TSH: TSH: 1.093 u[IU]/mL (ref 0.350–4.500)

## 2015-06-26 NOTE — Progress Notes (Signed)
Patient ID: [name], female   DOB: [birth year], 65 y.o.   MRN: [id number]  Assessment and Plan:  Hypertension:  -Continue medication,  -monitor blood pressure at home.  -Continue DASH diet.   -Reminder to go to the ER if any CP, SOB, nausea, dizziness, severe HA, changes vision/speech, left arm numbness and tingling, and jaw pain.  Cholesterol: -Continue diet and exercise.  -Check cholesterol.   Pre-diabetes: -stop blood glucose monitoring -seeing dietician -Continue diet and exercise.  -Check A1C  Vitamin D Def: -check level -continue medications.   Left flank pain -urine culture Likely muscle strain given  exam-UA -urine culture   Discussed previous ER visit for chest pain.  Previous cards workup was negative.  No further pain.  Recommended following with Dr. Terrence Dupont if further pain.    Continue diet and meds as discussed. Further disposition pending results of labs.  HPI 65 y.o. female  presents for 3 month follow up with hypertension, hyperlipidemia, prediabetes and vitamin D.   Her blood pressure has been controlled at home, today their BP is BP: (!) 102/58 mmHg.   She does workout. She denies chest pain, shortness of breath, dizziness.   She is on cholesterol medication and denies myalgias. Her cholesterol is at goal. The cholesterol last visit was:   Lab Results  Component Value Date   CHOL 208* 12/12/2014   HDL 78 12/12/2014   LDLCALC 119* 12/12/2014   TRIG 55 12/12/2014   CHOLHDL 2.7 12/12/2014     She has been working on diet and exercise for prediabetes, and denies foot ulcerations, hyperglycemia, hypoglycemia , increased appetite, nausea, paresthesia of the feet, polydipsia, polyuria, visual disturbances, vomiting and weight loss. Last A1C in the office was:  Lab Results  Component Value Date   HGBA1C 5.8* 12/12/2014    Patient is on Vitamin D supplement.  Lab Results  Component Value Date   VD25OH 49 12/12/2014     She reports that she has  continued to have problems with her sinuses.  She reports that she has continued to have some issues with nasal drainage.    She reports that she is due to see the dietician coming up at the end of this month.  She reports that she has been checking her blood sugar at home with her own monitor.  She has been eating a lot more food but healthier foods. She also reports that she hasn't been exercising, but she would like to start.  She reports that she is going to sign up for a wellness program.    She reports that for the past 1.5 months she has been having some pain in her left side of her back.  She reports that it is a discomfort that radiates from behind the left side of her ribcage.  She reports the pain is nearly constant, worse with palpation and at night time.  She reports that the pain is a dull ache.  She sometimes notes some sharp pains with it.     Current Medications:  Current Outpatient Prescriptions on File Prior to Visit  Medication Sig Dispense Refill  . alum & mag hydroxide-simeth (MAALOX/MYLANTA) 200-200-20 MG/5ML suspension Take 30 mLs by mouth every 6 (six) hours as needed for indigestion or heartburn.    Marland Kitchen aspirin 81 MG tablet Take 81 mg by mouth daily.    . cetirizine (ZYRTEC) 10 MG tablet Take 10 mg by mouth daily as needed for allergies.    . Cholecalciferol (D-3-5) 5000  UNITS capsule Take 5,000 Units by mouth daily.    Marland Kitchen escitalopram (LEXAPRO) 10 MG tablet Take 1 tablet (10 mg total) by mouth daily. 30 tablet 2  . Flaxseed, Linseed, (FLAXSEED OIL PO) Take by mouth daily.    . fluticasone (FLONASE) 50 MCG/ACT nasal spray Place 2 sprays into both nostrils daily. 16 g 0  . guaiFENesin (MUCINEX) 600 MG 12 hr tablet Take by mouth 2 (two) times daily as needed for cough.    . Lutein 20 MG TABS Take by mouth.    . magnesium chloride (SLOW-MAG) 64 MG TBEC Take 1 tablet by mouth daily.    . Multiple Vitamins-Minerals (WOMENS MULTI VITAMIN & MINERAL PO) Take 1 tablet by mouth  daily.    . Omega-3 Fatty Acids (FISH OIL PO) Take by mouth daily.    Marland Kitchen omeprazole (PRILOSEC) 40 MG capsule Take 40-80 mg by mouth daily.   1  . Probiotic Product (ALIGN) 4 MG CAPS Take by mouth daily.    . ranitidine (ZANTAC) 300 MG tablet Take 1 tablet at Bedtime for Acid Indigestion & Reflux 90 tablet 3  . RESTASIS 0.05 % ophthalmic emulsion instill 1 drop into both eyes twice a day  0  . tolterodine (DETROL LA) 4 MG 24 hr capsule Take 1 capsule (4 mg total) by mouth daily. 30 capsule 12   No current facility-administered medications on file prior to visit.    Medical History:  Past Medical History  Diagnosis Date  . GERD (gastroesophageal reflux disease)   . Prediabetes   . Depression   . Arthritis     cervical and lumbar  . Vitamin D deficiency   . Hyperlipidemia     Allergies:  Allergies  Allergen Reactions  . Bactrim [Sulfamethoxazole-Trimethoprim] Other (See Comments)    Patient preference to take medication without sulfa     Review of Systems:  Review of Systems  Constitutional: Negative for fever, chills and malaise/fatigue.  HENT: Negative for congestion, ear pain and sore throat.   Eyes: Negative.   Respiratory: Negative for cough, shortness of breath and wheezing.   Cardiovascular: Positive for chest pain (once in december). Negative for palpitations and leg swelling.  Gastrointestinal: Positive for constipation. Negative for heartburn, diarrhea, blood in stool and melena.  Genitourinary: Positive for flank pain. Negative for dysuria, urgency, frequency and hematuria.  Musculoskeletal: Positive for back pain.  Skin: Negative.   Neurological: Negative for dizziness, sensory change, loss of consciousness and headaches.  Psychiatric/Behavioral: Negative for depression. The patient is nervous/anxious. The patient does not have insomnia.     Family history- Review and unchanged  Social history- Review and unchanged  Physical Exam: BP 102/58 mmHg  Pulse 62   Temp(Src) 98 F (36.7 C) (Temporal)  Resp 16  Ht 5\' 4"  (1.626 m)  Wt 131 lb (59.421 kg)  BMI 22.47 kg/m2 Wt Readings from Last 3 Encounters:  06/26/15 131 lb (59.421 kg)  05/18/15 130 lb 3.2 oz (59.058 kg)  04/13/15 134 lb 3.2 oz (60.873 kg)    General Appearance: Well nourished well developed, in no apparent distress. Eyes: PERRLA, EOMs, conjunctiva no swelling or erythema ENT/Mouth: Ear canals normal without obstruction, swelling, erythma, discharge.  TMs normal bilaterally.  Oropharynx moist, clear, without exudate, or postoropharyngeal swelling. Neck: Supple, thyroid normal,no cervical adenopathy  Respiratory: Respiratory effort normal, Breath sounds clear A&P without rhonchi, wheeze, or rale.  No retractions, no accessory usage. Cardio: RRR with no MRGs. Brisk peripheral pulses without edema.  Abdomen:  Soft, + BS,  Non tender, no guarding, rebound, hernias, masses. Musculoskeletal: Full ROM, 5/5 strength, Normal gait Skin: Warm, dry without rashes, lesions, ecchymosis.  Neuro: Awake and oriented X 3, Cranial nerves intact. Normal muscle tone, no cerebellar symptoms. Psych: Normal affect, Insight and Judgment appropriate.    Starlyn Skeans, PA-C 10:05 AM [facility]

## 2015-06-26 NOTE — Patient Instructions (Signed)
Mid-Back Strain With Rehab  A strain is an injury in which a tendon or muscle is torn. The muscles and tendons of the mid-back are vulnerable to strains. However, these muscles and tendons are very strong and require a great force to be injured. The muscles of the mid-back are responsible for stabilizing the spinal column, as well as spinal twisting (rotation). Strains are classified into three categories. Grade 1 strains cause pain, but the tendon is not lengthened. Grade 2 strains include a lengthened ligament, due to the ligament being stretched or partially ruptured. With grade 2 strains there is still function, although the function may be decreased. Grade 3 strains involve a complete tear of the tendon or muscle, and function is usually impaired. SYMPTOMS   Pain in the middle of the back.  Pain that may affect only one side, and is worse with movement.  Muscle spasms, and often swelling in the back.  Loss of strength of the back muscles.  Crackling sound (crepitation) when the muscles are touched. CAUSES  Mid-back strains occur when a force is placed on the muscles or tendons that is greater than they can handle. Common causes of injury include:  Ongoing overuse of the muscle-tendon units in the middle back, usually from incorrect body posture.  A single violent injury or force applied to the back. RISK INCREASES WITH:  Sports that involve twisting forces on the spine or a lot of bending at the waist (football, rugby, weightlifting, bowling, golf, tennis, speed skating, racquetball, swimming, running, gymnastics, diving).  Poor strength and flexibility.  Failure to warm up properly before activity.  Family history of low back pain or disk disorders.  Previous back injury or surgery (especially fusion). PREVENTION  Learn and use proper sports technique.  Warm up and stretch properly before activity.  Allow for adequate recovery between workouts.  Maintain physical  fitness:  Strength, flexibility, and endurance.  Cardiovascular fitness. PROGNOSIS  If treated properly, mid-back strains usually heal within 6 weeks. RELATED COMPLICATIONS   Frequently recurring symptoms, resulting in a chronic problem. Properly treating the problem the first time decreases frequency of recurrence.  Chronic inflammation, scarring, and partial muscle-tendon tear.  Delayed healing or resolution of symptoms, especially if activity is resumed too soon.  Prolonged disability. TREATMENT Treatment first involves the use of ice and medicine, to reduce pain and inflammation. As the pain begins to subside, you may begin strengthening and stretching exercises to improve body posture and sport technique. These exercises may be performed at home or with a therapist. Severe injuries may require referral to a therapist for further evaluation and treatment, such as ultrasound. Corticosteroid injections may be given to help reduce inflammation. Biofeedback (watching monitors of your body processes) and psychotherapy may also be prescribed. Prolonged bed rest is felt to do more harm than good. Massage may help break the muscle spasms. Sometimes, an injection of cortisone, with or without local anesthetics, may be given to help relieve the pain and spasms. MEDICATION   If pain medicine is needed, nonsteroidal anti-inflammatory medicines (aspirin and ibuprofen), or other minor pain relievers (acetaminophen), are often advised.  Do not take pain medicine for 7 days before surgery.  Prescription pain relievers may be given, if your caregiver thinks they are needed. Use only as directed and only as much as you need.  Ointments applied to the skin may be helpful.  Corticosteroid injections may be given by your caregiver. These injections should be reserved for the most serious cases, because  they may only be given a certain number of times. HEAT AND COLD:   Cold treatment (icing) should be  applied for 10 to 15 minutes every 2 to 3 hours for inflammation and pain, and immediately after activity that aggravates your symptoms. Use ice packs or an ice massage.  Heat treatment may be used before performing stretching and strengthening activities prescribed by your caregiver, physical therapist, or athletic trainer. Use a heat pack or a warm water soak. SEEK IMMEDIATE MEDICAL CARE IF:  Symptoms get worse or do not improve in 2 to 4 weeks, despite treatment.  You develop numbness, weakness, or loss of bowel or bladder function.  New, unexplained symptoms develop. (Drugs used in treatment may produce side effects.) EXERCISES RANGE OF MOTION (ROM) AND STRETCHING EXERCISES - Mid-Back Strain These exercises may help you when beginning to rehabilitate your injury. In order to successfully resolve your symptoms, you must improve your posture. These exercises are designed to help reduce the forward-head and rounded-shoulder posture which contributes to this condition. Your symptoms may resolve with or without further involvement from your physician, physical therapist or athletic trainer. While completing these exercises, remember:   Restoring tissue flexibility helps normal motion to return to the joints. This allows healthier, less painful movement and activity.  An effective stretch should be held for at least 30 seconds.  A stretch should never be painful. You should only feel a gentle lengthening or release in the stretched tissue. STRETCH - Axial Extension  Stand or sit on a firm surface. Assume a good posture: chest up, shoulders drawn back, stomach muscles slightly tense, knees unlocked (if standing) and feet hip width apart.  Slowly retract your chin, so your head slides back and your chin slightly lowers. Continue to look straight ahead.  You should feel a gentle stretch in the back of your head. Be certain not to feel an aggressive stretch since this can cause headaches  later.  Hold for __________ seconds. Repeat __________ times. Complete this exercise __________ times per day. RANGE OF MOTION- Upper Thoracic Extension  Sit on a firm chair with a high back. Assume a good posture: chest up, shoulders drawn back, abdominal muscles slightly tense, and feet hip width apart. Place a small pillow or folded towel in the curve of your lower back, if you are having difficulty maintaining good posture.  Gently brace your neck with your hands, allowing your arms to rest on your chest.  Continue to support your neck and slowly extend your back over the chair. You will feel a stretch across your upper back.  Hold __________ seconds. Slowly return to the starting position. Repeat __________ times. Complete this exercise __________ times per day. RANGE OF MOTION- Mid-Thoracic Extension  Roll a towel so that it is about 4 inches in diameter.  Position the towel lengthwise. Lay on the towel so that your spine, but not your shoulder blades, are supported.  You should feel your mid-back arching toward the floor. To increase the stretch, extend your arms away from your body.  Hold for __________ seconds. Repeat exercise __________ times, __________ times per day. STRENGTHENING EXERCISES - Mid-Back Strain These exercises may help you when beginning to rehabilitate your injury. They may resolve your symptoms with or without further involvement from your physician, physical therapist or athletic trainer. While completing these exercises, remember:   Muscles can gain both the endurance and the strength needed for everyday activities through controlled exercises.  Complete these exercises as instructed by  your physician, physical therapist or athletic trainer. Increase the resistance and repetitions only as guided by your caregiver.  You may experience muscle soreness or fatigue, but the pain or discomfort you are trying to eliminate should never worsen during these  exercises. If this pain does worsen, stop and make certain you are following the directions exactly. If the pain is still present after adjustments, discontinue the exercise until you can discuss the trouble with your caregiver. STRENGTHENING - Quadruped, Opposite UE/LE Lift  Assume a hands and knees position on a firm surface. Keep your hands under your shoulders and your knees under your hips. You may place padding under your knees for comfort.  Find your neutral spine and gently tense your abdominal muscles so that you can maintain this position. Your shoulders and hips should form a rectangle that is parallel with the floor and is not twisted.  Keeping your trunk steady, lift your right hand no higher than your shoulder and then your left leg no higher than your hip. Make sure you are not holding your breath. Hold this position __________ seconds.  Continuing to keep your abdominal muscles tense and your back steady, slowly return to your starting position. Repeat with the opposite arm and leg. Repeat __________ times. Complete this exercise __________ times per day.  STRENGTH - Shoulder Extensors  Secure a rubber exercise band or tubing to a fixed object (table, pole) so that it is at the height of your shoulders when you are either standing, or sitting on a firm armless chair.  With a thumbs-up grip, grasp an end of the band in each hand. Straighten your elbows and lift your hands straight in front of you at shoulder height. Step back away from the secured end of band, until it becomes tense.  Squeezing your shoulder blades together, pull your hands down to the sides of your thighs. Do not allow your hands to go behind you.  Hold for __________ seconds. Slowly ease the tension on the band, as you reverse the directions and return to the starting position. Repeat __________ times. Complete this exercise __________ times per day.  STRENGTH - Horizontal Abductors Choose one of the two  positions to complete this exercise. Prone: lying on stomach:  Lie on your stomach on a firm surface so that your right / left arm overhangs the edge. Rest your forehead on your opposite forearm. With your palm facing the floor and your elbow straight, hold a __________ weight in your hand.  Squeeze your right / left shoulder blade to your mid-back spine and then slowly raise your arm to the height of the bed.  Hold for __________ seconds. Slowly reverse the directions and return to the starting position, controlling the weight as you lower your arm. Repeat __________ times. Complete this exercise __________ times per day. Standing:   Secure a rubber exercise band or tubing, so that it is at the height of your shoulders when you are either standing, or sitting on a firm armless chair.  Grasp an end of the band in each hand and have your palms face each other. Straighten your elbows and lift your hands straight in front of you at shoulder height. Step back away from the secured end of band, until it becomes tense.  Squeeze your shoulder blades together. Keeping your elbows locked and your hands at shoulder height, spread your arms apart, forming a "T" shape with your body. Hold __________ seconds. Slowly ease the tension on the band, as  you reverse the directions and return to the starting position. Repeat __________ times. Complete this exercise __________ times per day. STRENGTH - Scapular Retractors and External Rotators, Rowing  Secure a rubber exercise band or tubing, so that it is at the height of your shoulders when you are either standing, or sitting on a firm armless chair.  With a palm-down grip, grasp an end of the band in each hand. Straighten your elbows and lift your hands straight in front of you at shoulder height. Step back away from the secured end of band, until it becomes tense.  Step 1: Squeeze your shoulder blades together. Bending your elbows, draw your hands to your  chest as if you are rowing a boat. At the end of this motion, your hands and elbow should be at shoulder height and your elbows should be out to your sides.  Step 2: Rotate your shoulder to raise your hands above your head. Your forearms should be vertical and your upper arms should be horizontal.  Hold for __________ seconds. Slowly ease the tension on the band, as you reverse the directions and return to the starting position. Repeat __________ times. Complete this exercise __________ times per day.  POSTURE AND BODY MECHANICS CONSIDERATIONS - Mid-Back Strain Keeping correct posture when sitting, standing or completing your activities will reduce the stress put on different body tissues, allowing injured tissues a chance to heal and limiting painful experiences. The following are general guidelines for improved posture. Your physician or physical therapist will provide you with any instructions specific to your needs. While reading these guidelines, remember:  The exercises prescribed by your provider will help you have the flexibility and strength to maintain correct postures.  The correct posture provides the best environment for your joints to work. All of your joints have less wear and tear when properly supported by a spine with good posture. This means you will experience a healthier, less painful body.  Correct posture must be practiced with all of your activities, especially prolonged sitting and standing. Correct posture is as important when doing repetitive low-stress activities (typing) as it is when doing a single heavy-load activity (lifting). PROPER SITTING POSTURE In order to minimize stress and discomfort on your spine, you must sit with correct posture. Sitting with good posture should be effortless for a healthy body. Returning to good posture is a gradual process. Many people can work toward this most comfortably by using various supports until they have the flexibility and  strength to maintain this posture on their own. When sitting with proper posture, your ears will fall over your shoulders and your shoulders will fall over your hips. You should use the back of the chair to support your upper back. Your lower back will be in a neutral position, just slightly arched. You may place a small pillow or folded towel at the base of your low back for  support.  When working at a desk, create an environment that supports good, upright posture. Without extra support, muscles fatigue and lead to excessive strain on joints and other tissues. Keep these recommendations in mind: CHAIR:  A chair should be able to slide under your desk when your back makes contact with the back of the chair. This allows you to work closely.  The chair's height should allow your eyes to be level with the upper part of your monitor and your hands to be slightly lower than your elbows. BODY POSITION  Your feet should make contact with  the floor. If this is not possible, use a foot rest.  Keep your ears over your shoulders. This will reduce stress on your neck and lower back. INCORRECT SITTING POSTURES If you are feeling tired and unable to assume a healthy sitting posture, do not slouch or slump. This puts excessive strain on your back tissues, causing more damage and pain. Healthier options include:  Using more support, like a lumbar pillow.  Switching tasks to something that requires you to be upright or walking.  Talking a brief walk.  Lying down to rest in a neutral-spine position. CORRECT STANDING POSTURES Proper standing posture should be assumed with all daily activities, even if they only take a few moments, like when brushing your teeth. As in sitting, your ears should fall over your shoulders and your shoulders should fall over your hips. You should keep a slight tension in your abdominal muscles to brace your spine. Your tailbone should point down to the ground, not behind your  body, resulting in an over-extended swayback posture.  INCORRECT STANDING POSTURES Common incorrect standing postures include a forward head, locked knees, and an excessive swayback. WALKING Walk with an upright posture. Your ears, shoulders and hips should all line-up. CORRECT LIFTING TECHNIQUES DO :   Assume a wide stance. This will provide you more stability and the opportunity to get as close as possible to the object which you are lifting.  Tense your abdominals to brace your spine. Bend at the knees and hips. Keeping your back locked in a neutral-spine position, lift using your leg muscles. Lift with your legs, keeping your back straight.  Test the weight of unknown objects before attempting to lift them.  Try to keep your elbows locked down at your sides in order get the best strength from your shoulders when carrying an object.  Always ask for help when lifting heavy or awkward objects. INCORRECT LIFTING TECHNIQUES DO NOT:   Lock your knees when lifting, even if it is a small object.  Bend and twist. Pivot at your feet or move your feet when needing to change directions.  Assume that you can safely pick up even a paperclip without proper posture.   This information is not intended to replace advice given to you by your health care provider. Make sure you discuss any questions you have with your health care provider.   Document Released: 06/02/2005 Document Revised: 10/17/2014 Document Reviewed: 09/14/2008 Elsevier Interactive Patient Education [employer].

## 2015-06-27 LAB — CBC WITH DIFFERENTIAL/PLATELET
BASOS PCT: 1 % (ref 0–1)
Basophils Absolute: 0 10*3/uL (ref 0.0–0.1)
EOS PCT: 2 % (ref 0–5)
Eosinophils Absolute: 0.1 10*3/uL (ref 0.0–0.7)
HEMATOCRIT: 38.8 % (ref 36.0–46.0)
HEMOGLOBIN: 12.8 g/dL (ref 12.0–15.0)
LYMPHS ABS: 1.8 10*3/uL (ref 0.7–4.0)
LYMPHS PCT: 57 % — AB (ref 12–46)
MCH: 27.8 pg (ref 26.0–34.0)
MCHC: 33 g/dL (ref 30.0–36.0)
MCV: 84.3 fL (ref 78.0–100.0)
MONO ABS: 0.3 10*3/uL (ref 0.1–1.0)
MONOS PCT: 10 % (ref 3–12)
MPV: 10.5 fL (ref 8.6–12.4)
Neutro Abs: 1 10*3/uL — ABNORMAL LOW (ref 1.7–7.7)
Neutrophils Relative %: 30 % — ABNORMAL LOW (ref 43–77)
Platelets: 228 10*3/uL (ref 150–400)
RBC: 4.6 MIL/uL (ref 3.87–5.11)
RDW: 13.8 % (ref 11.5–15.5)
WBC: 3.2 10*3/uL — ABNORMAL LOW (ref 4.0–10.5)

## 2015-06-27 LAB — URINALYSIS, ROUTINE W REFLEX MICROSCOPIC
BILIRUBIN URINE: NEGATIVE
Glucose, UA: NEGATIVE
Hgb urine dipstick: NEGATIVE
Ketones, ur: NEGATIVE
LEUKOCYTES UA: NEGATIVE
Nitrite: NEGATIVE
PROTEIN: NEGATIVE
Specific Gravity, Urine: 1.004 (ref 1.001–1.035)
pH: 6.5 (ref 5.0–8.0)

## 2015-06-27 LAB — HEMOGLOBIN A1C
HEMOGLOBIN A1C: 5.9 % — AB (ref <5.7)
MEAN PLASMA GLUCOSE: 123 mg/dL — AB (ref <117)

## 2015-06-27 LAB — URINE CULTURE
COLONY COUNT: NO GROWTH
ORGANISM ID, BACTERIA: NO GROWTH

## 2015-07-04 ENCOUNTER — Encounter: Payer: Self-pay | Admitting: *Deleted

## 2015-07-04 ENCOUNTER — Encounter: Payer: Commercial Managed Care - HMO | Attending: Internal Medicine | Admitting: *Deleted

## 2015-07-04 VITALS — Ht 64.5 in | Wt 132.0 lb

## 2015-07-04 DIAGNOSIS — R739 Hyperglycemia, unspecified: Secondary | ICD-10-CM | POA: Diagnosis present

## 2015-07-04 DIAGNOSIS — Z713 Dietary counseling and surveillance: Secondary | ICD-10-CM | POA: Diagnosis not present

## 2015-07-04 NOTE — Progress Notes (Signed)
  Medical Nutrition Therapy:  Appt start time: 0800 end time:  0930.  Assessment:  Primary concerns today: pateint has pre-diabetes and would like to know how to prevent it from moving into diabetes. 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 She lives with her husband, who she states is supportive. She knows of a family history of diabetes through her father, which concerns her. She does not exercise due to her long work hours and worries some about unwanted weight loss. She does have a meter from [employer] and checks her BG a few times a week. All BG's appear to be within target ranges for pre-diabetes.  Preferred Learning Style: Auditory, Visual and Hands on  Learning Readiness:   Ready  Change in progress   MEDICATIONS: see list. No diabetes medications at this time   DIETARY INTAKE:  Usual eating pattern includes 3 meals and 3 snacks per day.  Everyday foods include good variety of all food groups.  Avoided foods include sweets and high fat foods.    24-hr recall:  B ( AM): regular oatmeal with walnuts, Splenda, 1 Tbsp plain yogurt Snk ( AM): fresh fruit with PNB,OR  celery with hummus  L ( PM): eats out often: [employer]- black bean soup, whole grain roll and salad OR grilled chic filet sandwich OR [employer] with veggie plate with pinto beans and 2 veggies, corn bread Snk ( PM): same as AM, Skinny plain popcorn D ( PM): lean meat, starch - often beans, green vegetable Snk ( PM): 2 ginger snap cookies OR ice cream sandwich infrequently Beverages: coffee, water, diet soda occasionally  Usual physical activity: not at this time  Estimated energy needs: 1400 calories 158 g carbohydrates 105 g protein 39 g fat  Progress Towards Goal(s):  In progress.   Nutritional Diagnosis:  NB-1.1 Food and nutrition-related knowledge deficit As related to Pre diabetes.  As evidenced by A1c ranging from 5.8-6.1%.    Intervention:  Nutrition counseling and pre-diabetes education  initiated. Discussed Carb Counting by food group as method of portion control, reading food labels, and benefits of increased activity. Suggested mild increase in healthy fats to help with unwanted weight loss, especially with proposed increase in activity level. Also discussed basic physiology of pre-diabetes, target BG ranges pre and post meals, and A1c.   Teaching Method Utilized: Visual, Auditory and Hands on  Handouts given during visit include: Living Well with Diabetes Carb Counting and Food Label handouts Meal Plan Card  Barriers to learning/adherence to lifestyle change: none  Demonstrated degree of understanding via:  Teach Back   Monitoring/Evaluation:  Dietary intake, exercise, reading food labels, and body weight prn.

## 2015-07-04 NOTE — Patient Instructions (Signed)
Plan:  Aim for 2 Carb Choices per meal (30 grams) +/- 1 either way  Aim for 0-1 Carbs per snack if hungry  Include protein in moderation with your meals and snacks Consider reading food labels for Total Carbohydrate of foods Consider  increasing your activity level by walking, dancing etc daily as tolerated Continue checking BG at alternate times per day

## 2015-07-07 ENCOUNTER — Encounter: Payer: Self-pay | Admitting: *Deleted

## 2015-07-27 ENCOUNTER — Encounter: Payer: Self-pay | Admitting: Internal Medicine

## 2015-07-27 ENCOUNTER — Ambulatory Visit (INDEPENDENT_AMBULATORY_CARE_PROVIDER_SITE_OTHER): Payer: Commercial Managed Care - HMO | Admitting: Internal Medicine

## 2015-07-27 VITALS — BP 98/56 | HR 72 | Temp 98.0°F | Resp 16 | Ht 64.0 in | Wt 132.0 lb

## 2015-07-27 DIAGNOSIS — M546 Pain in thoracic spine: Secondary | ICD-10-CM

## 2015-07-27 MED ORDER — DOXYCYCLINE HYCLATE 100 MG PO CAPS: 100.0000 mg | ORAL_CAPSULE | Freq: Two times a day (BID) | ORAL | Status: DC

## 2015-07-27 NOTE — Patient Instructions (Signed)
Please use warm compresses on the spot on your elbow as often as possible.  DO NOT PICK AT IT!  Please take the doxycycline until it is completely gone.  If you are going to be outside for more than 20 minutes at a time please use sunscreen.

## 2015-07-27 NOTE — Progress Notes (Signed)
   Subjective:    Patient ID: [name], female    DOB: 09-12-1950, 65 y.o.   MRN: [id number]  HPI  Patient presents to the office for evaluation of a soreness of the right elbow x 5 days.  She reports that she has had some redness and also some mild swelling to the arm.  She had a previous abscess there in the past which she had to have an I&D for in 2010/2011.  She is unsure the date.  She hasn't had issues since.  No injury that she can think of that would have irritated it.  She has not done anything to it so far.  She reports that she did have her blood pressure checked on that arm yesterday.  She does have full ROM of the arm.     Review of Systems  Constitutional: Negative for fever and chills.  Musculoskeletal: Negative for myalgias and arthralgias.  Skin: Positive for color change.       Objective:   Physical Exam  Constitutional: She is oriented to person, place, and time. She appears well-developed and well-nourished. No distress.  HENT:  Head: Normocephalic.  Mouth/Throat: Oropharynx is clear and moist. No oropharyngeal exudate.  Eyes: Conjunctivae are normal. No scleral icterus.  Neck: Normal range of motion. Neck supple. No JVD present. No thyromegaly present.  Cardiovascular: Normal rate, regular rhythm, normal heart sounds and intact distal pulses.  Exam reveals no gallop and no friction rub.   No murmur heard. Pulmonary/Chest: Effort normal and breath sounds normal. No respiratory distress. She has no wheezes. She has no rales. She exhibits no tenderness.  Musculoskeletal: Normal range of motion.  Lymphadenopathy:    She has no cervical adenopathy.  Neurological: She is alert and oriented to person, place, and time.  Skin: Skin is warm and dry. She is not diaphoretic.  Psychiatric: She has a normal mood and affect. Her behavior is normal. Judgment and thought content normal.  Nursing note and vitals reviewed.   Filed Vitals:   07/27/15 1138  BP: 98/56   Pulse: 72  Temp: 98 F (36.7 C)  Resp: 16        Assessment & Plan:    1. Left-sided thoracic back pain See prior notes for exam - Ambulatory referral to Physical Therapy  2. Epidermal inclusion cyst -possible infection -doxycycline -warm compresses

## 2015-08-03 ENCOUNTER — Encounter: Payer: Self-pay | Admitting: Internal Medicine

## 2015-08-03 ENCOUNTER — Ambulatory Visit (INDEPENDENT_AMBULATORY_CARE_PROVIDER_SITE_OTHER): Payer: Commercial Managed Care - HMO | Admitting: Internal Medicine

## 2015-08-03 VITALS — BP 100/58 | HR 64 | Temp 98.2°F | Resp 16 | Ht 64.0 in

## 2015-08-03 DIAGNOSIS — L02413 Cutaneous abscess of right upper limb: Secondary | ICD-10-CM | POA: Diagnosis not present

## 2015-08-03 MED ORDER — CEPHALEXIN 500 MG PO CAPS: 500.0000 mg | ORAL_CAPSULE | Freq: Three times a day (TID) | ORAL | Status: AC

## 2015-08-03 NOTE — Progress Notes (Signed)
   Subjective:    Patient ID: [name], female    DOB: 06/22/1950, 65 y.o.   MRN: [id number]  Arm Pain   Patient presents to the office for evaluation of right arm redness.  She was seen last week for the same and was given doxycycline.  She did not finish the antibiotic.  She reports that it has come to a head.  No drainage.  No fevers, chills, or vomiting.  Some nausea.   She did not use warm compresses either.  She reports that she didn't know that she was supposed to call.     Review of Systems  Constitutional: Negative for fever and chills.  Gastrointestinal: Positive for nausea. Negative for vomiting.  Skin: Positive for wound.       Objective:   Physical Exam  Constitutional: She is oriented to person, place, and time. She appears well-developed and well-nourished. No distress.  HENT:  Head: Normocephalic.  Mouth/Throat: Oropharynx is clear and moist. No oropharyngeal exudate.  Eyes: Conjunctivae are normal.  Neck: Normal range of motion.  Cardiovascular: Normal rate, regular rhythm, normal heart sounds and intact distal pulses.  Exam reveals no gallop and no friction rub.   No murmur heard. Pulmonary/Chest: Effort normal and breath sounds normal. No respiratory distress. She has no wheezes. She has no rales. She exhibits no tenderness.  Musculoskeletal: Normal range of motion.  Lymphadenopathy:    She has no cervical adenopathy.  Neurological: She is alert and oriented to person, place, and time.  Skin: She is not diaphoretic.     Psychiatric: She has a normal mood and affect. Her behavior is normal. Judgment and thought content normal.  Nursing note and vitals reviewed.         Assessment & Plan:    1. Abscess of right arm -non-compliant originally -reinforced using warm compresses -patient to return if worsening redness - cephALEXin (KEFLEX) 500 MG capsule; Take 1 capsule (500 mg total) by mouth 3 (three) times daily.  Dispense: 30 capsule; Refill:  0

## 2015-08-03 NOTE — Patient Instructions (Signed)
Cephalexin tablets or capsules  What is this medicine?  CEPHALEXIN (sef a LEX in) is a cephalosporin antibiotic. It is used to treat certain kinds of bacterial infections It will not work for colds, flu, or other viral infections.  This medicine may be used for other purposes; ask your health care provider or pharmacist if you have questions.  What should I tell my health care provider before I take this medicine?  They need to know if you have any of these conditions:  -kidney disease  -stomach or intestine problems, especially colitis  -an unusual or allergic reaction to cephalexin, other cephalosporins, penicillins, other antibiotics, medicines, foods, dyes or preservatives  -pregnant or trying to get pregnant  -breast-feeding  How should I use this medicine?  Take this medicine by mouth with a full glass of water. Follow the directions on the prescription label. This medicine can be taken with or without food. Take your medicine at regular intervals. Do not take your medicine more often than directed. Take all of your medicine as directed even if you think you are better. Do not skip doses or stop your medicine early.  Talk to your pediatrician regarding the use of this medicine in children. While this drug may be prescribed for selected conditions, precautions do apply.  Overdosage: If you think you have taken too much of this medicine contact a poison control center or emergency room at once.  NOTE: This medicine is only for you. Do not share this medicine with others.  What if I miss a dose?  If you miss a dose, take it as soon as you can. If it is almost time for your next dose, take only that dose. Do not take double or extra doses. There should be at least 4 to 6 hours between doses.  What may interact with this medicine?  -probenecid  -some other antibiotics  This list may not describe all possible interactions. Give your health care provider a list of all the medicines, herbs, non-prescription drugs, or  dietary supplements you use. Also tell them if you smoke, drink alcohol, or use illegal drugs. Some items may interact with your medicine.  What should I watch for while using this medicine?  Tell your doctor or health care professional if your symptoms do not begin to improve in a few days.  Do not treat diarrhea with over the counter products. Contact your doctor if you have diarrhea that lasts more than 2 days or if it is severe and watery.  If you have diabetes, you may get a false-positive result for sugar in your urine. Check with your doctor or health care professional.  What side effects may I notice from receiving this medicine?  Side effects that you should report to your doctor or health care professional as soon as possible:  -allergic reactions like skin rash, itching or hives, swelling of the face, lips, or tongue  -breathing problems  -pain or trouble passing urine  -redness, blistering, peeling or loosening of the skin, including inside the mouth  -severe or watery diarrhea  -unusually weak or tired  -yellowing of the eyes, skin  Side effects that usually do not require medical attention (report to your doctor or health care professional if they continue or are bothersome):  -gas or heartburn  -genital or anal irritation  -headache  -joint or muscle pain  -nausea, vomiting  This list may not describe all possible side effects. Call your doctor for medical advice about side effects.   You may report side effects to FDA at 1-800-FDA-1088.  Where should I keep my medicine?  Keep out of the reach of children.  Store at room temperature between 59 and 86 degrees F (15 and 30 degrees C). Throw away any unused medicine after the expiration date.  NOTE: This sheet is a summary. It may not cover all possible information. If you have questions about this medicine, talk to your doctor, pharmacist, or health care provider.     © 2016, [employer]vier/[employer]. (2007-09-06 17:09:13)

## 2015-08-09 ENCOUNTER — Ambulatory Visit (INDEPENDENT_AMBULATORY_CARE_PROVIDER_SITE_OTHER): Payer: Commercial Managed Care - HMO | Admitting: Internal Medicine

## 2015-08-09 VITALS — BP 114/62 | HR 62 | Temp 98.0°F | Resp 18

## 2015-08-09 DIAGNOSIS — L02419 Cutaneous abscess of limb, unspecified: Secondary | ICD-10-CM | POA: Diagnosis not present

## 2015-08-09 MED ORDER — TRAMADOL HCL 50 MG PO TABS: 50.0000 mg | ORAL_TABLET | Freq: Four times a day (QID) | ORAL | Status: DC | PRN

## 2015-08-09 NOTE — Progress Notes (Signed)
   Subjective:    Patient ID: [name], female    DOB: [birth year], 65 y.o.   MRN: [id number]  HPI  Patient presents to the office for evaluation of right arm abscess.  She reports that she is having increased pain in the abscess.  She reports that it is an aching nagging pain.  She has not noticed any drainage.  She has been taking keflex.  She is tolerated the medication well.  She reports that she has noticed increased swelling and redness.  She is afraid it is going to spread.    Review of Systems  Constitutional: Negative for fever and chills.  Gastrointestinal: Negative for nausea and vomiting.  Skin: Positive for color change and wound. Negative for rash.       Objective:   Physical Exam  Constitutional: She appears well-developed and well-nourished. No distress.  HENT:  Head: Normocephalic.  Mouth/Throat: Oropharynx is clear and moist. No oropharyngeal exudate.  Eyes: Conjunctivae are normal. No scleral icterus.  Neck: Normal range of motion. Neck supple. No JVD present. No thyromegaly present.  Cardiovascular: Normal rate, regular rhythm, normal heart sounds and intact distal pulses.  Exam reveals no gallop and no friction rub.   No murmur heard. Pulmonary/Chest: Effort normal and breath sounds normal. No respiratory distress. She has no wheezes. She has no rales. She exhibits no tenderness.  Lymphadenopathy:    She has no cervical adenopathy.  Skin: Skin is warm and dry. She is not diaphoretic.     Psychiatric: She has a normal mood and affect. Her behavior is normal. Judgment and thought content normal.  Nursing note and vitals reviewed.   Filed Vitals:   08/09/15 0929  BP: 114/62  Pulse: 62  Temp: 98 F (36.7 C)  Resp: 18         Assessment & Plan:    1. Abscess of arm -continue keflex -offered I&D today as no relief with abx.   -patient declined and would like to come back tomorrow to have it done so her husband can be there with her -ultram prn  for pain

## 2015-08-10 ENCOUNTER — Encounter: Payer: Self-pay | Admitting: Internal Medicine

## 2015-08-10 ENCOUNTER — Ambulatory Visit (INDEPENDENT_AMBULATORY_CARE_PROVIDER_SITE_OTHER): Payer: Commercial Managed Care - HMO | Admitting: Internal Medicine

## 2015-08-10 VITALS — BP 96/58 | HR 70 | Temp 98.0°F | Resp 18

## 2015-08-10 DIAGNOSIS — L02413 Cutaneous abscess of right upper limb: Secondary | ICD-10-CM

## 2015-08-10 NOTE — Progress Notes (Signed)
Patient presents to the office for evaluation of abscess.  She has had this for approximately 3 weeks and despite oral abx has increased in size and pain.  She is reports that she has had to take tramadol for it.  She has had previous abscess in this spot.  Incision and Drainage Procedure Note  Pre-operative Diagnosis: Abscess right arm  Post-operative Diagnosis: same  Indications: Abscess on right arm with failed conservative treatments  Anesthesia: 1% plain marcaine with epi 2 ccs, 1% plain lidocaine without epi 1 cc  Procedure Details  The procedure, risks and complications have been discussed in detail (including, but not limited to airway compromise, infection, bleeding) with the patient, and the patient has signed consent to the procedure.  The skin was sterilely prepped and draped over the affected area in the usual fashion. After adequate local anesthesia, I&D with a #11 blade was performed on the right arm. Purulent drainage: present.  Moderate amount of purulent discharge expressed.  Blunt disection was performed with sterile scissors to break up loculations.    The patient was observed until stable.  Findings: -abscess with adequate drainage  EBL: 3 cc's  Drains: no drains or packing  Condition: Tolerated procedure well   Complications: none.   Instructions: -patient discharged home -finish keflex -warm compresses -tramadol as needed -recheck on Monday -if worsening redness, nausea, vomiting, patient to go to ER.  She is aware and so is husband.

## 2015-08-13 ENCOUNTER — Ambulatory Visit: Payer: Self-pay | Admitting: Internal Medicine

## 2015-08-14 ENCOUNTER — Encounter: Payer: Self-pay | Admitting: Internal Medicine

## 2015-08-14 ENCOUNTER — Ambulatory Visit (INDEPENDENT_AMBULATORY_CARE_PROVIDER_SITE_OTHER): Payer: Commercial Managed Care - HMO | Admitting: Internal Medicine

## 2015-08-14 VITALS — BP 116/64 | HR 66 | Temp 98.0°F | Resp 18

## 2015-08-14 DIAGNOSIS — L02413 Cutaneous abscess of right upper limb: Secondary | ICD-10-CM | POA: Diagnosis not present

## 2015-08-14 NOTE — Progress Notes (Signed)
   Subjective:    Patient ID: [name], female    DOB: [birth year], 65 y.o.   MRN: [id number]  HPI  Patient presents for abscess recheck 4 days after I&D.  Wound healing well by her report.  She has continued to go ahead and do warm compresses.  She also reports that she hasn't had fevers, chills, nausea, vomiting or redness.    Review of Systems  Constitutional: Negative for fever, chills and fatigue.  Respiratory: Negative for chest tightness and shortness of breath.   Cardiovascular: Negative for chest pain and palpitations.  Gastrointestinal: Negative for nausea and vomiting.  Skin: Positive for wound.       Objective:   Physical Exam  Constitutional: She appears well-developed and well-nourished. No distress.  HENT:  Head: Normocephalic.  Mouth/Throat: Oropharynx is clear and moist. No oropharyngeal exudate.  Eyes: Conjunctivae are normal. No scleral icterus.  Neck: Normal range of motion. Neck supple. No JVD present. No thyromegaly present.  Cardiovascular: Normal rate, regular rhythm, normal heart sounds and intact distal pulses.  Exam reveals no gallop and no friction rub.   No murmur heard. Pulmonary/Chest: Effort normal and breath sounds normal. No respiratory distress. She has no wheezes. She has no rales. She exhibits no tenderness.  Lymphadenopathy:    She has no cervical adenopathy.  Skin: Skin is warm. She is not diaphoretic.     Nursing note and vitals reviewed.   Filed Vitals:   08/14/15 1012  BP: 116/64  Pulse: 66  Temp: 98 F (36.7 C)  Resp: 18         Assessment & Plan:    1. Abscess of right arm -healing well -finish keflex -cont warm compresses

## 2015-09-08 ENCOUNTER — Encounter: Payer: Self-pay | Admitting: *Deleted

## 2015-09-25 ENCOUNTER — Ambulatory Visit (INDEPENDENT_AMBULATORY_CARE_PROVIDER_SITE_OTHER): Payer: Commercial Managed Care - HMO | Admitting: Internal Medicine

## 2015-09-25 VITALS — BP 102/66 | HR 89 | Resp 16 | Wt 134.0 lb

## 2015-09-25 DIAGNOSIS — L720 Epidermal cyst: Secondary | ICD-10-CM | POA: Diagnosis not present

## 2015-09-25 DIAGNOSIS — R030 Elevated blood-pressure reading, without diagnosis of hypertension: Secondary | ICD-10-CM

## 2015-09-25 DIAGNOSIS — R7303 Prediabetes: Secondary | ICD-10-CM | POA: Diagnosis not present

## 2015-09-25 DIAGNOSIS — E785 Hyperlipidemia, unspecified: Secondary | ICD-10-CM

## 2015-09-25 DIAGNOSIS — E559 Vitamin D deficiency, unspecified: Secondary | ICD-10-CM

## 2015-09-25 DIAGNOSIS — Z79899 Other long term (current) drug therapy: Secondary | ICD-10-CM | POA: Diagnosis not present

## 2015-09-25 DIAGNOSIS — IMO0001 Reserved for inherently not codable concepts without codable children: Secondary | ICD-10-CM

## 2015-09-25 LAB — HEPATIC FUNCTION PANEL
ALK PHOS: 72 U/L (ref 33–130)
ALT: 16 U/L (ref 6–29)
AST: 19 U/L (ref 10–35)
Albumin: 3.6 g/dL (ref 3.6–5.1)
BILIRUBIN INDIRECT: 0.3 mg/dL (ref 0.2–1.2)
Bilirubin, Direct: 0.1 mg/dL (ref <=0.2)
TOTAL PROTEIN: 6.4 g/dL (ref 6.1–8.1)
Total Bilirubin: 0.4 mg/dL (ref 0.2–1.2)

## 2015-09-25 LAB — CBC WITH DIFFERENTIAL/PLATELET
BASOS ABS: 28 {cells}/uL (ref 0–200)
Basophils Relative: 1 %
EOS ABS: 84 {cells}/uL (ref 15–500)
Eosinophils Relative: 3 %
HEMATOCRIT: 37.7 % (ref 35.0–45.0)
HEMOGLOBIN: 12.5 g/dL (ref 11.7–15.5)
LYMPHS ABS: 1400 {cells}/uL (ref 850–3900)
Lymphocytes Relative: 50 %
MCH: 28.2 pg (ref 27.0–33.0)
MCHC: 33.2 g/dL (ref 32.0–36.0)
MCV: 85.1 fL (ref 80.0–100.0)
MONO ABS: 308 {cells}/uL (ref 200–950)
MONOS PCT: 11 %
MPV: 10.4 fL (ref 7.5–12.5)
NEUTROS ABS: 980 {cells}/uL — AB (ref 1500–7800)
Neutrophils Relative %: 35 %
Platelets: 178 10*3/uL (ref 140–400)
RBC: 4.43 MIL/uL (ref 3.80–5.10)
RDW: 13.3 % (ref 11.0–15.0)
WBC: 2.8 10*3/uL — ABNORMAL LOW (ref 3.8–10.8)

## 2015-09-25 LAB — LIPID PANEL
Cholesterol: 194 mg/dL (ref 125–200)
HDL: 87 mg/dL (ref >=46)
LDL CALC: 98 mg/dL (ref <130)
Total CHOL/HDL Ratio: 2.2 Ratio (ref <=5.0)
Triglycerides: 44 mg/dL (ref <150)
VLDL: 9 mg/dL (ref <30)

## 2015-09-25 LAB — BASIC METABOLIC PANEL WITH GFR
BUN: 11 mg/dL (ref 7–25)
CALCIUM: 9.1 mg/dL (ref 8.6–10.4)
CHLORIDE: 106 mmol/L (ref 98–110)
CO2: 26 mmol/L (ref 20–31)
Creat: 0.62 mg/dL (ref 0.50–0.99)
GFR, Est African American: >89 mL/min (ref >=60)
GLUCOSE: 75 mg/dL (ref 65–99)
POTASSIUM: 4.5 mmol/L (ref 3.5–5.3)
Sodium: 139 mmol/L (ref 135–146)

## 2015-09-25 LAB — HEMOGLOBIN A1C
HEMOGLOBIN A1C: 5.8 % — AB (ref <5.7)
Mean Plasma Glucose: 120 mg/dL

## 2015-09-25 LAB — TSH: TSH: 1 m[IU]/L

## 2015-09-25 NOTE — Progress Notes (Signed)
Assessment and Plan:  Hypertension:  -Continue medication,  -monitor blood pressure at home.  -Continue DASH diet.   -Reminder to go to the ER if any CP, SOB, nausea, dizziness, severe HA, changes vision/speech, left arm numbness and tingling, and jaw pain.  Cholesterol: -Continue diet and exercise.  -Check cholesterol.   Pre-diabetes: -Continue diet and exercise.  -Check A1C  Vitamin D Def: -check level -continue medications.   Epidermal cyst -referral to derm for removal  Continue diet and meds as discussed. Further disposition pending results of labs.  HPI 65 y.o. female  presents for 3 month follow up with hypertension, hyperlipidemia, prediabetes and vitamin D.   Her blood pressure has been controlled at home, today their BP is BP: 102/66 mmHg.   She does not workout. She denies chest pain, shortness of breath, dizziness.   She is on cholesterol medication and denies myalgias. Her cholesterol is at goal. The cholesterol last visit was:   Lab Results  Component Value Date   CHOL 210* 06/26/2015   HDL 82 06/26/2015   LDLCALC 120 06/26/2015   TRIG 42 06/26/2015   CHOLHDL 2.6 06/26/2015     She has been working on diet and exercise for prediabetes, and denies foot ulcerations, hyperglycemia, hypoglycemia , increased appetite, nausea, paresthesia of the feet, polydipsia, polyuria, visual disturbances, vomiting and weight loss. Last A1C in the office was:  Lab Results  Component Value Date   HGBA1C 5.9* 06/26/2015    Patient is on Vitamin D supplement.  Lab Results  Component Value Date   VD25OH 75 12/12/2014     She does feel that there is a recurrence of fluid building up in her right elbow.  This is the third time this has happened.  She would like to see a dermatologist for this.  We have performed 2 I&D here in the office.    Current Medications:  Current Outpatient Prescriptions on File Prior to Visit  Medication Sig Dispense Refill  . alum & mag  hydroxide-simeth (MAALOX/MYLANTA) 200-200-20 MG/5ML suspension Take 30 mLs by mouth every 6 (six) hours as needed for indigestion or heartburn.    Marland Kitchen aspirin 81 MG tablet Take 81 mg by mouth daily.    . cephALEXin (KEFLEX) 500 MG capsule Take 500 mg by mouth 3 (three) times daily.    . cetirizine (ZYRTEC) 10 MG tablet Take 10 mg by mouth daily as needed for allergies.    . Cholecalciferol (D-3-5) 5000 UNITS capsule Take 5,000 Units by mouth daily.    Marland Kitchen escitalopram (LEXAPRO) 10 MG tablet Take 1 tablet (10 mg total) by mouth daily. 30 tablet 2  . Flaxseed, Linseed, (FLAXSEED OIL PO) Take by mouth daily.    . fluticasone (FLONASE) 50 MCG/ACT nasal spray Place 2 sprays into both nostrils daily. 16 g 0  . Lutein 20 MG TABS Take by mouth.    . magnesium chloride (SLOW-MAG) 64 MG TBEC Take 1 tablet by mouth daily.    . Multiple Vitamins-Minerals (WOMENS MULTI VITAMIN & MINERAL PO) Take 1 tablet by mouth daily.    . Omega-3 Fatty Acids (FISH OIL PO) Take by mouth daily.    Marland Kitchen omeprazole (PRILOSEC) 40 MG capsule Take 40-80 mg by mouth daily.   1  . Probiotic Product (ALIGN) 4 MG CAPS Take by mouth daily.    . ranitidine (ZANTAC) 300 MG tablet Take 1 tablet at Bedtime for Acid Indigestion & Reflux 90 tablet 3  . RESTASIS 0.05 % ophthalmic emulsion instill  1 drop into both eyes twice a day  0  . tolterodine (DETROL LA) 4 MG 24 hr capsule Take 1 capsule (4 mg total) by mouth daily. 30 capsule 12  . traMADol (ULTRAM) 50 MG tablet Take 1 tablet (50 mg total) by mouth every 6 (six) hours as needed. 20 tablet 0   No current facility-administered medications on file prior to visit.    Medical History:  Past Medical History  Diagnosis Date  . GERD (gastroesophageal reflux disease)   . Prediabetes   . Depression   . Arthritis     cervical and lumbar  . Vitamin D deficiency   . Hyperlipidemia     Allergies:  Allergies  Allergen Reactions  . Bactrim [Sulfamethoxazole-Trimethoprim] Other (See Comments)     Patient preference to take medication without sulfa     Review of Systems:  Review of Systems  Constitutional: Negative for fever, chills and malaise/fatigue.  HENT: Negative for congestion, ear pain and sore throat.   Eyes: Negative.   Respiratory: Negative for cough, shortness of breath and wheezing.   Cardiovascular: Negative for chest pain, palpitations, claudication and leg swelling.  Gastrointestinal: Negative for heartburn, abdominal pain, diarrhea, constipation, blood in stool and melena.  Genitourinary: Negative.   Skin: Negative.        Cyst to right arm  Neurological: Negative for dizziness, sensory change, seizures and headaches.  Psychiatric/Behavioral: Negative for depression. The patient is not nervous/anxious and does not have insomnia.     Family history- Review and unchanged  Social history- Review and unchanged  Physical Exam: BP 102/66 mmHg  Pulse 89  Resp 16  Wt 134 lb (60.782 kg)  SpO2 99% Wt Readings from Last 3 Encounters:  09/25/15 134 lb (60.782 kg)  07/27/15 132 lb (59.875 kg)  07/04/15 132 lb (59.875 kg)    General Appearance: Well nourished well developed, in no apparent distress. Eyes: PERRLA, EOMs, conjunctiva no swelling or erythema ENT/Mouth: Ear canals normal without obstruction, swelling, erythma, discharge.  TMs normal bilaterally.  Oropharynx moist, clear, without exudate, or postoropharyngeal swelling. Neck: Supple, thyroid normal,no cervical adenopathy  Respiratory: Respiratory effort normal, Breath sounds clear A&P without rhonchi, wheeze, or rale.  No retractions, no accessory usage. Cardio: RRR with no MRGs. Brisk peripheral pulses without edema.  Abdomen: Soft, + BS,  Non tender, no guarding, rebound, hernias, masses. Musculoskeletal: Full ROM, 5/5 strength, Normal gait Skin: Warm, dry without rashes, lesions, ecchymosis. Inclusion cyst 1 cm x 1 cm to the lateral epicodyle area.  Firm to palpation without fluctuance or erythema.    Neuro: Awake and oriented X 3, Cranial nerves intact. Normal muscle tone, no cerebellar symptoms. Psych: Normal affect, Insight and Judgment appropriate.    Starlyn Skeans, PA-C 1:42 PM [facility]

## 2015-11-04 ENCOUNTER — Encounter: Payer: Self-pay | Admitting: *Deleted

## 2015-11-29 ENCOUNTER — Encounter (HOSPITAL_COMMUNITY): Payer: Self-pay | Admitting: Neurology

## 2015-11-29 ENCOUNTER — Emergency Department (HOSPITAL_COMMUNITY): Payer: 59

## 2015-11-29 ENCOUNTER — Emergency Department (HOSPITAL_COMMUNITY)
Admission: EM | Admit: 2015-11-29 | Discharge: 2015-11-29 | Disposition: A | Discharge status: Home / Self Care | Payer: 59 | Attending: Emergency Medicine | Admitting: Emergency Medicine

## 2015-11-29 DIAGNOSIS — Z5321 Procedure and treatment not carried out due to patient leaving prior to being seen by health care provider: Secondary | ICD-10-CM | POA: Diagnosis not present

## 2015-11-29 DIAGNOSIS — R079 Chest pain, unspecified: Secondary | ICD-10-CM | POA: Insufficient documentation

## 2015-11-29 LAB — BASIC METABOLIC PANEL
ANION GAP: 9 (ref 5–15)
BUN: 9 mg/dL (ref 6–20)
CALCIUM: 9.6 mg/dL (ref 8.9–10.3)
CHLORIDE: 103 mmol/L (ref 101–111)
CO2: 24 mmol/L (ref 22–32)
Creatinine, Ser: 0.65 mg/dL (ref 0.44–1.00)
GFR calc Af Amer: >60 mL/min (ref >60)
GFR calc non Af Amer: >60 mL/min (ref >60)
GLUCOSE: 82 mg/dL (ref 65–99)
POTASSIUM: 4 mmol/L (ref 3.5–5.1)
Sodium: 136 mmol/L (ref 135–145)

## 2015-11-29 LAB — CBC
HEMATOCRIT: 40 % (ref 36.0–46.0)
Hemoglobin: 13.2 g/dL (ref 12.0–15.0)
MCH: 27.6 pg (ref 26.0–34.0)
MCHC: 33 g/dL (ref 30.0–36.0)
MCV: 83.7 fL (ref 78.0–100.0)
Platelets: 163 10*3/uL (ref 150–400)
RBC: 4.78 MIL/uL (ref 3.87–5.11)
RDW: 12.6 % (ref 11.5–15.5)
WBC: 3.2 10*3/uL — ABNORMAL LOW (ref 4.0–10.5)

## 2015-11-29 LAB — I-STAT TROPONIN, ED: Troponin i, poc: 0 ng/mL (ref 0.00–0.08)

## 2015-11-29 NOTE — ED Notes (Signed)
Pt reports onset of cp 45 mins ago while sitting, central cp, denies sob. Felt something overcome her that started in her head and spread down her body. Is a x 4. Denies cardiac hx.

## 2015-11-30 ENCOUNTER — Ambulatory Visit (INDEPENDENT_AMBULATORY_CARE_PROVIDER_SITE_OTHER): Payer: Commercial Managed Care - HMO | Admitting: Internal Medicine

## 2015-11-30 ENCOUNTER — Encounter: Payer: Self-pay | Admitting: Internal Medicine

## 2015-11-30 ENCOUNTER — Ambulatory Visit: Payer: Self-pay | Admitting: Internal Medicine

## 2015-11-30 VITALS — BP 98/62 | HR 68 | Temp 97.5°F | Resp 16 | Ht 64.0 in | Wt 135.0 lb

## 2015-11-30 DIAGNOSIS — N3281 Overactive bladder: Secondary | ICD-10-CM

## 2015-11-30 DIAGNOSIS — M940 Chondrocostal junction syndrome [Tietze]: Secondary | ICD-10-CM | POA: Diagnosis not present

## 2015-11-30 MED ORDER — MELOXICAM 15 MG PO TABS: ORAL_TABLET | ORAL | Status: DC

## 2015-11-30 MED ORDER — OXYBUTYNIN CHLORIDE 5 MG PO TABS: ORAL_TABLET | ORAL | Status: DC

## 2015-11-30 NOTE — Patient Instructions (Signed)
Costochondritis Costochondritis, sometimes called Tietze syndrome, is a swelling and irritation (inflammation) of the tissue (cartilage) that connects your ribs with your breastbone (sternum). It causes pain in the chest and rib area. Costochondritis usually goes away on its own over time. It can take up to 6 weeks or longer to get better, especially if you are unable to limit your activities. CAUSES  Some cases of costochondritis have no known cause. Possible causes include:  Injury (trauma).  Exercise or activity such as lifting.  Severe coughing. SIGNS AND SYMPTOMS  Pain and tenderness in the chest and rib area.  Pain that gets worse when coughing or taking deep breaths.  Pain that gets worse with specific movements. DIAGNOSIS  Your health care provider will do a physical exam and ask about your symptoms. Chest X-rays or other tests may be done to rule out other problems. TREATMENT  Costochondritis usually goes away on its own over time. Your health care provider may prescribe medicine to help relieve pain. HOME CARE INSTRUCTIONS   Avoid exhausting physical activity. Try not to strain your ribs during normal activity. This would include any activities using chest, abdominal, and side muscles, especially if heavy weights are used.  Apply ice to the affected area for the first 2 days after the pain begins.  Put ice in a plastic bag.  Place a towel between your skin and the bag.  Leave the ice on for 20 minutes, 2-3 times a day.  Only take over-the-counter or prescription medicines as directed by your health care provider. SEEK MEDICAL CARE IF:  You have redness or swelling at the rib joints. These are signs of infection.  Your pain does not go away despite rest or medicine. SEEK IMMEDIATE MEDICAL CARE IF:   Your pain increases or you are very uncomfortable.  You have shortness of breath or difficulty breathing.  You cough up blood.  You have worse chest pains,  sweating, or vomiting.  You have a fever or persistent symptoms for more than 2-3 days.  You have a fever and your symptoms suddenly get worse. MAKE SURE YOU:   Understand these instructions.  Will watch your condition.  Will get help right away if you are not doing well or get worse.   ++++++++++++++++++++++++++++++++++++  Overactive Bladder, Adult Overactive bladder is a group of urinary symptoms. With overactive bladder, you may suddenly feel the need to pass urine (urinate) right away. After feeling this sudden urge, you might also leak urine if you cannot get to the bathroom fast enough (urinary incontinence). These symptoms might interfere with your daily work or social activities. Overactive bladder symptoms may also wake you up at night. Overactive bladder affects the nerve signals between your bladder and your brain. Your bladder may get the signal to empty before it is full. Very sensitive muscles can also make your bladder squeeze too soon. CAUSES Many things can cause an overactive bladder. Possible causes include:  Urinary tract infection.  Infection of nearby tissues, such as the prostate.  Prostate enlargement.  Being pregnant with twins or more (multiples).  Surgery on the uterus or urethra.  Bladder stones, inflammation, or tumors.  Drinking too much caffeine or alcohol.  Certain medicines, especially those that you take to help your body get rid of extra fluid (diuretics) by increasing urine production.  Muscle or nerve weakness, especially from:  A spinal cord injury.  Stroke.  Multiple sclerosis.  Parkinson disease.  Diabetes. This can cause a high urine volume  that fills the bladder so quickly that the normal urge to urinate is triggered very strongly.  Constipation. A buildup of too much stool can put pressure on your bladder. RISK FACTORS You may be at greater risk for overactive bladder if you:  Are an older adult.  Smoke.  Are going  through menopause.  Have prostate problems.  Have a neurological disease, such as stroke, dementia, Parkinson disease, or multiple sclerosis (MS).  Eat or drink things that irritate the bladder. These include alcohol, spicy food, and caffeine.  Are overweight or obese. SIGNS AND SYMPTOMS  The signs and symptoms of an overactive bladder include:  Sudden, strong urges to urinate.  Leaking urine.  Urinating eight or more times per day.  Waking up to urinate two or more times per night. DIAGNOSIS Your health care provider may suspect overactive bladder based on your symptoms. The health care provider will do a physical exam and take your medical history. Blood or urine tests may also be done. For example, you might need to have a bladder function test to check how well you can hold your urine. You might also need to see a health care provider who specializes in the urinary tract (urologist). TREATMENT Treatment for overactive bladder depends on the cause of your condition and whether it is mild or severe. Certain treatments can be done in your health care provider's office or clinic. You can also make lifestyle changes at home. Options include: Behavioral Treatments  Biofeedback. A specialist uses sensors to help you become aware of your body's signals.  Keeping a daily log of when you need to urinate and what happens after the urge. This may help you manage your condition.  Bladder training. This helps you learn to control the urge to urinate by following a schedule that directs you to urinate at regular intervals (timed voiding). At first, you might have to wait a few minutes after feeling the urge. In time, you should be able to schedule bathroom visits an hour or more apart.  Kegel exercises. These are exercises to strengthen the pelvic floor muscles, which support the bladder. Toning these muscles can help you control urination, even if your bladder muscles are overactive. A  specialist will teach you how to do these exercises correctly. They require daily practice.  Weight loss. If you are obese or overweight, losing weight might relieve your symptoms of overactive bladder. Talk to your health care provider about losing weight and whether there is a specific program or method that would work best for you.  Diet change. This might help if constipation is making your overactive bladder worse. Your health care provider or a dietitian can explain ways to change what you eat to ease constipation. You might also need to consume less alcohol and caffeine or drink other fluids at different times of the day.  Stopping smoking.  Wearing pads to absorb leakage while you wait for other treatments to take effect. Physical Treatments  Electrical stimulation. Electrodes send gentle pulses of electricity to strengthen the nerves or muscles that help to control the bladder. Sometimes, the electrodes are placed outside of the body. In other cases, they might be placed inside the body (implanted). This treatment can take several months to have an effect.  Supportive devices. Women may need a plastic device that fits into the vagina and supports the bladder (pessary). Medicines Several medicines can help treat overactive bladder and are usually used along with other treatments. Some are injected into the  muscles involved in urination. Others come in pill form. Your health care provider may prescribe:  Antispasmodics. These medicines block the signals that the nerves send to the bladder. This keeps the bladder from releasing urine at the wrong time.  Tricyclic antidepressants. These types of antidepressants also relax bladder muscles. Surgery  You may have a device implanted to help manage the nerve signals that indicate when you need to urinate.  You may have surgery to implant electrodes for electrical stimulation.  Sometimes, very severe cases of overactive bladder require  surgery to change the shape of the bladder. HOME CARE INSTRUCTIONS   Take medicines only as directed by your health care provider.  Use any implants or a pessary as directed by your health care provider.  Make any diet or lifestyle changes that are recommended by your health care provider. These might include:  Drinking less fluid or drinking at different times of the day. If you need to urinate often during the night, you may need to stop drinking fluids early in the evening.  Cutting down on caffeine or alcohol. Both can make an overactive bladder worse. Caffeine is found in coffee, tea, and sodas.  Doing Kegel exercises to strengthen muscles.  Losing weight if you need to.  Eating a healthy and balanced diet to prevent constipation.  Keep a journal or log to track how much and when you drink and also when you feel the need to urinate. This will help your health care provider to monitor your condition. SEEK MEDICAL CARE IF:  Your symptoms do not get better after treatment.  Your pain and discomfort are getting worse.  You have more frequent urges to urinate.  You have a fever. SEEK IMMEDIATE MEDICAL CARE IF: You are not able to control your bladder at all.

## 2015-11-30 NOTE — Progress Notes (Signed)
  Subjective:    Patient ID: [name], female    DOB: 1950/10/25, 65 y.o.   MRN: [id number]  HPI  This very nice 65 yo MBF with labile HTN, HLD, GERD, preDM had vague CP Last nite and went to ER and had screening labs to r/o ACS and they were negative and she then left w/o evaluation as the back-up was so long at the ER. She describes her discomfort as a bilateral anterior chest discomfort exacerbated by position changes and no associated N/V, dyspnea, diaphoresis or GI sx's.  She also recounts ongoing issues with OAB sx's.   Medication Sig  . aspirin 81 MG tablet Take 81 mg by mouth daily.  . cetirizine  10 MG tablet Take 10 mg by mouth daily as needed for allergies.  . Vitamin D  5000 UNITS  Take 5,000 Units by mouth daily.  Marland Kitchen FLAXSEED OIL Take by mouth daily.  Marland Kitchen FLONASE nasal spray Place 2 sprays into both nostrils daily.  . Lutein 20 MG TABS Take by mouth.  . magnesium  64 MG  Take 1 tablet by mouth daily.  . WOMENS MultiVit & Min Take 1 tablet by mouth daily.  Marland Kitchen FISH OIL Take by mouth daily.  Marland Kitchen omeprazole  40 MG capsule Take 40-80 mg by mouth daily.   Marland Kitchen ALIGN 4 MG CAPS Take by mouth daily.  . ranitidine  300 MG tablet Take 1 tablet at Bedtime for Acid Indigestion & Reflux  . RESTASIS  ophth emulsion instill 1 drop into both eyes twice a day  . traMADol50 MG tablet Take 1 tablet (50 mg total) by mouth every 6 (six) hours as needed.   Allergies  Allergen Reactions  . Bactrim [Sulfamethoxazole-Trimethoprim] Other (See Comments)    Patient preference to take medication without sulfa   Past Medical History  Diagnosis Date  . GERD (gastroesophageal reflux disease)   . Prediabetes   . Depression   . Arthritis     cervical and lumbar  . Vitamin D deficiency   . Hyperlipidemia    Review of Systems  10 point systems review negative except as above.     Objective:   Physical Exam  BP 98/62 mmHg  Pulse 68  Temp(Src) 97.5 F (36.4 C)  Resp 16  Ht 5\' 4"  (1.626 m)  Wt 135  lb (61.236 kg)  BMI 23.16 kg/m2  In No Distress.   HEENT - Eac's patent. TM's Nl. EOM's full. PERRLA. NasoOroPharynx clear. Neck - supple. Nl Thyroid. Carotids 2+ & No bruits, nodes, JVD Chest - Bilat. Tender lower CC jts.  Clear equal BS w/o Rales, rhonchi, wheezes. Cor - Nl HS. RRR w/o sig MGR. PP 1(+). No edema. Abd - No palpable organomegaly, masses or tenderness. BS nl. MS- FROM w/o deformities. Muscle power, tone and bulk Nl. Gait Nl. Neuro - No obvious Cr N abnormalities. Sensory, motor and Cerebellar functions appear Nl w/o focal abnormalities Skin - clear w/o exposed rash, lesions, cyanosis, icterus or clubbing    Assessment & Plan:   1. Costochondritis  - meloxicam (MOBIC) 15 MG tablet; Take 1/2 to 1 tablet daily with food for Pain & inflammation  Dispense: 90 tablet; Refill: 1 - Recc try heating pad.  2. OAB (overactive bladder)  - oxybutynin (DITROPAN) 5 MG tablet; Take 1 tablet 2 x / day for bladder  Dispense: 180 tablet; Refill: 1

## 2015-12-26 ENCOUNTER — Encounter: Payer: Self-pay | Admitting: Internal Medicine

## 2015-12-26 ENCOUNTER — Ambulatory Visit (INDEPENDENT_AMBULATORY_CARE_PROVIDER_SITE_OTHER): Payer: Commercial Managed Care - HMO | Admitting: Internal Medicine

## 2015-12-26 VITALS — BP 98/62 | HR 56 | Temp 97.5°F | Resp 16 | Ht 64.5 in | Wt 135.2 lb

## 2015-12-26 DIAGNOSIS — Z136 Encounter for screening for cardiovascular disorders: Secondary | ICD-10-CM | POA: Diagnosis not present

## 2015-12-26 DIAGNOSIS — Z79899 Other long term (current) drug therapy: Secondary | ICD-10-CM

## 2015-12-26 DIAGNOSIS — R5383 Other fatigue: Secondary | ICD-10-CM

## 2015-12-26 DIAGNOSIS — Z0001 Encounter for general adult medical examination with abnormal findings: Secondary | ICD-10-CM

## 2015-12-26 DIAGNOSIS — K219 Gastro-esophageal reflux disease without esophagitis: Secondary | ICD-10-CM

## 2015-12-26 DIAGNOSIS — Z0000 Encounter for general adult medical examination without abnormal findings: Secondary | ICD-10-CM

## 2015-12-26 DIAGNOSIS — Z111 Encounter for screening for respiratory tuberculosis: Secondary | ICD-10-CM

## 2015-12-26 DIAGNOSIS — E785 Hyperlipidemia, unspecified: Secondary | ICD-10-CM

## 2015-12-26 DIAGNOSIS — R030 Elevated blood-pressure reading, without diagnosis of hypertension: Secondary | ICD-10-CM

## 2015-12-26 DIAGNOSIS — R7303 Prediabetes: Secondary | ICD-10-CM

## 2015-12-26 DIAGNOSIS — E559 Vitamin D deficiency, unspecified: Secondary | ICD-10-CM

## 2015-12-26 DIAGNOSIS — IMO0001 Reserved for inherently not codable concepts without codable children: Secondary | ICD-10-CM

## 2015-12-26 DIAGNOSIS — Z1212 Encounter for screening for malignant neoplasm of rectum: Secondary | ICD-10-CM

## 2015-12-26 LAB — LIPID PANEL
CHOL/HDL RATIO: 2.4 ratio (ref <=5.0)
CHOLESTEROL: 212 mg/dL — AB (ref 125–200)
HDL: 87 mg/dL (ref >=46)
LDL CALC: 116 mg/dL (ref <130)
TRIGLYCERIDES: 47 mg/dL (ref <150)
VLDL: 9 mg/dL (ref <30)

## 2015-12-26 LAB — CBC WITH DIFFERENTIAL/PLATELET
BASOS PCT: 1 %
Basophils Absolute: 28 cells/uL (ref 0–200)
EOS ABS: 84 {cells}/uL (ref 15–500)
Eosinophils Relative: 3 %
HEMATOCRIT: 38.6 % (ref 35.0–45.0)
Hemoglobin: 13.1 g/dL (ref 11.7–15.5)
LYMPHS PCT: 48 %
Lymphs Abs: 1344 cells/uL (ref 850–3900)
MCH: 28.5 pg (ref 27.0–33.0)
MCHC: 33.9 g/dL (ref 32.0–36.0)
MCV: 84.1 fL (ref 80.0–100.0)
MONO ABS: 196 {cells}/uL — AB (ref 200–950)
MPV: 10.3 fL (ref 7.5–12.5)
Monocytes Relative: 7 %
NEUTROS PCT: 41 %
Neutro Abs: 1148 cells/uL — ABNORMAL LOW (ref 1500–7800)
Platelets: 195 10*3/uL (ref 140–400)
RBC: 4.59 MIL/uL (ref 3.80–5.10)
RDW: 13.4 % (ref 11.0–15.0)
WBC: 2.8 10*3/uL — ABNORMAL LOW (ref 3.8–10.8)

## 2015-12-26 LAB — BASIC METABOLIC PANEL WITH GFR
BUN: 13 mg/dL (ref 7–25)
CALCIUM: 9.3 mg/dL (ref 8.6–10.4)
CO2: 26 mmol/L (ref 20–31)
Chloride: 104 mmol/L (ref 98–110)
Creat: 0.73 mg/dL (ref 0.50–0.99)
GFR, Est Non African American: 88 mL/min (ref >=60)
GLUCOSE: 107 mg/dL — AB (ref 65–99)
POTASSIUM: 4.3 mmol/L (ref 3.5–5.3)
Sodium: 138 mmol/L (ref 135–146)

## 2015-12-26 LAB — HEPATIC FUNCTION PANEL
ALK PHOS: 78 U/L (ref 33–130)
ALT: 17 U/L (ref 6–29)
AST: 19 U/L (ref 10–35)
Albumin: 4.1 g/dL (ref 3.6–5.1)
BILIRUBIN DIRECT: 0.1 mg/dL (ref <=0.2)
BILIRUBIN INDIRECT: 0.4 mg/dL (ref 0.2–1.2)
BILIRUBIN TOTAL: 0.5 mg/dL (ref 0.2–1.2)
TOTAL PROTEIN: 6.7 g/dL (ref 6.1–8.1)

## 2015-12-26 LAB — IRON AND TIBC
%SAT: 43 % (ref 11–50)
Iron: 119 ug/dL (ref 45–160)
TIBC: 277 ug/dL (ref 250–450)
UIBC: 158 ug/dL (ref 125–400)

## 2015-12-26 LAB — MAGNESIUM: Magnesium: 2.1 mg/dL (ref 1.5–2.5)

## 2015-12-26 LAB — VITAMIN B12: Vitamin B-12: 448 pg/mL (ref 200–1100)

## 2015-12-26 NOTE — Progress Notes (Signed)
Patient ID: [name], female   DOB: 08-01-1950, 65 y.o.   MRN: [id number]  [facility]                   Unk Pinto, M.D.    Uvaldo Bristle. Silverio Lay, P.A.-C      Starlyn Skeans, P.A.-C   [facility]                [address] [place] [id number] Telephone [phone] Telefax [phone]  ______________________________________________________________________  Annual Screening/Preventative Visit And Comprehensive Evaluation &  Examination     This very nice 65 y.o. MBF presents for a Wellness/Preventative Visit & comprehensive evaluation and management of multiple medical co-morbidities.  Patient has been followed for elevated BP, Prediabetes, Hyperlipidemia and Vitamin D Deficiency. Patient also has hx/o IBS & GERD and has been followed by Dr. Earlean Shawl in the past.       Patient is monitored expectantly for elevated BP and BP has been controlled at home and patient denies any cardiac symptoms as chest pain, palpitations, shortness of breath, dizziness or ankle swelling. Today's BP is  98/62 mmHg      Patient's hyperlipidemia is controlled with diet and supplements. Last lipids were at goal with Cholesterol 194; HDL 87; LDL 98; Triglycerides 44 on  09/25/2015.     Patient has prediabetes predating since 2014 with A1c 5.7% and then 5.9% in 2015, and 6.1% in Feb 2016. Patient denies reactive hypoglycemic symptoms, visual blurring, diabetic polys, or paresthesias. Last A1c was 5.8% on 09/25/2015.     Finally, patient has history of Vitamin D Deficiency and last Vitamin D was 61 on 12/12/2014.  Medication Sig  . MAALOX/MYLANTA  susp Take 30 mLs by mouth every 6 (six) hours as needed  . aspirin 81 MG tablet Take 81 mg by mouth daily.  . cetirizine  10 MG tablet Take 10 mg by mouth daily as needed for allergies.  . D 5000 UNITS Take 5,000 Units by mouth daily.  Marland Kitchen FLAXSEED OIL  Take by mouth daily.  Marland Kitchen  FLONASE nasal spray Place 2 sprays into both nostrils daily.  . Lutein 20 MG TABS Take by mouth.  Marland Kitchen SLOW-MAG 64 MG  Take 1 tablet by mouth daily.  . meloxicam  15 MG tablet Take 1/2 to 1 tablet daily with food for Pain & inflammation  . WOMENS MultiVit  & MINERAL Take 1 tablet by mouth daily.  . Omega-3 FISH OIL  Take by mouth daily.  . Omeprazole 40 MG  Take 40-80 mg by mouth daily.   . Probiotic / ALIGN  Take by mouth daily.  . RESTASIS 0.05 % ophth emulsion instill 1 drop into both eyes twice a day  . traMADol  50 MG tablet Take 1 tablet (50 mg total) by mouth every 6 (six) hours as needed.  . Ranitidine  300 MG tablet Take 1 tablet at Bedtime for Acid Indigestion & Reflux   Allergies  Allergen Reactions  . Bactrim [Sulfamethoxazole-Trimethoprim] Other (See Comments)    Patient preference to take medication without sulfa   Past Medical History  Diagnosis Date  . GERD (gastroesophageal reflux disease)   . Prediabetes   . Depression   . Arthritis     cervical and lumbar  . Vitamin D deficiency   . Hyperlipidemia    Health Maintenance  Topic  Date Due  . Hepatitis C Screening  1951/04/11  . PNEUMOCOCCAL POLYSACCHARIDE VACCINE (1) 04/17/1954  . OPHTHALMOLOGY EXAM  04/17/1962  . HIV Screening  04/18/1967  . FOOT EXAM  12/07/2014  . PAP SMEAR  11/19/2015  . URINE MICROALBUMIN  12/12/2015  . INFLUENZA VACCINE  01/15/2016  . HEMOGLOBIN A1C  03/26/2016  . MAMMOGRAM  05/06/2017  . TETANUS/TDAP  05/17/2017  . COLONOSCOPY  08/16/2025  . ZOSTAVAX  Completed   Immunization History  Administered Date(s) Administered  . PPD Test 12/12/2014  . Td 05/18/2007  . Zoster 05/17/2006   Past Surgical History  Procedure Laterality Date  . Cardiac catheterization  2011    normal (Dr. Terrence Dupont)   Social History  Substance Use Topics  . Smoking status: Never Smoker   . Smokeless tobacco: Never Used  . Alcohol Use: No    ROS Constitutional: Denies fever, chills, weight loss/gain,  headaches, insomnia,  night sweats, and change in appetite. Does c/o fatigue. Eyes: Denies redness, blurred vision, diplopia, discharge, itchy, watery eyes.  ENT: Denies discharge, congestion, post nasal drip, epistaxis, sore throat, earache, hearing loss, dental pain, Tinnitus, Vertigo, Sinus pain, snoring.  Cardio: Denies chest pain, palpitations, irregular heartbeat, syncope, dyspnea, diaphoresis, orthopnea, PND, claudication, edema Respiratory: denies cough, dyspnea, DOE, pleurisy, hoarseness, laryngitis, wheezing.  Gastrointestinal: Denies dysphagia, heartburn, reflux, water brash, pain, cramps, nausea, vomiting, bloating, diarrhea, constipation, hematemesis, melena, hematochezia, jaundice, hemorrhoids Genitourinary: Denies dysuria, frequency, urgency, nocturia, hesitancy, discharge, hematuria, flank pain Breast: Breast lumps, nipple discharge, bleeding.  Musculoskeletal: Denies arthralgia, myalgia, stiffness, Jt. Swelling, pain, limp, and strain/sprain. Denies falls. Skin: Denies puritis, rash, hives, warts, acne, eczema, changing in skin lesion Neuro: No weakness, tremor, incoordination, spasms, paresthesia, pain Psychiatric: Denies confusion, memory loss, sensory loss. Denies Depression. Endocrine: Denies change in weight, skin, hair change, nocturia, and paresthesia, diabetic polys, visual blurring, hyper / hypo glycemic episodes.  Heme/Lymph: No excessive bleeding, bruising, enlarged lymph nodes.  Physical Exam  BP 98/62 mmHg  Pulse 56  Temp(Src) 97.5 F (36.4 C)  Resp 16  Ht 5' 4.5" (1.638 m)  Wt 135 lb 3.2 oz (61.326 kg)  BMI 22.86 kg/m2  General Appearance: Well nourished and in no apparent distress. Eyes: PERRLA, EOMs, conjunctiva no swelling or erythema, normal fundi and vessels. Sinuses: No frontal/maxillary tenderness ENT/Mouth: EACs patent / TMs  nl. Nares clear without erythema, swelling, mucoid exudates. Oral hygiene is good. No erythema, swelling, or exudate. Tongue  normal, non-obstructing. Tonsils not swollen or erythematous. Hearing normal.  Neck: Supple, thyroid normal. No bruits, nodes or JVD. Respiratory: Respiratory effort normal.  BS equal and clear bilateral without rales, rhonci, wheezing or stridor. Cardio: Heart sounds are normal with regular rate and rhythm and no murmurs, rubs or gallops. Peripheral pulses are normal and equal bilaterally without edema. No aortic or femoral bruits. Chest: symmetric with normal excursions and percussion. Breasts: Symmetric, without lumps, nipple discharge, retractions, or fibrocystic changes.  Abdomen: Flat, soft with bowel sounds active. Nontender, no guarding, rebound, hernias, masses, or organomegaly.  Lymphatics: Non tender without lymphadenopathy.  Genitourinary:  Musculoskeletal: Full ROM all peripheral extremities, joint stability, 5/5 strength, and normal gait. Skin: Warm and dry without rashes, lesions, cyanosis, clubbing or  ecchymosis.  Neuro: Cranial nerves intact, reflexes equal bilaterally. Normal muscle tone, no cerebellar symptoms. Sensation intact.  Pysch: Alert and oriented X 3, normal affect, Insight and Judgment appropriate.   Assessment and Plan  1. Annual Preventative Screening Examination  - Microalbumin / creatinine urine ratio -  EKG 12-Lead - [place], RETROPERITNL ABD,  LTD - POC Hemoccult Bld/Stl - Urinalysis, Routine w reflex microscopic - Vitamin B12 - Iron and TIBC - CBC with Differential/Platelet - BASIC METABOLIC PANEL WITH GFR - Hepatic function panel - Magnesium - Lipid panel - TSH - Hemoglobin A1c - Insulin, random - VITAMIN D 25 Hydroxy   2. Elevated BP  - Microalbumin / creatinine urine ratio - EKG 12-Lead - [place], RETROPERITNL ABD,  LTD - TSH  3. Hyperlipidemia  - Lipid panel - TSH  4. Prediabetes  - Hemoglobin A1c - Insulin, random  5. Vitamin D deficiency  - VITAMIN D 25 Hydroxy   6. Gastroesophageal reflux disease   7. Screening for rectal  cancer  - POC Hemoccult Bld/Stl   8. Medication management  - Urinalysis, Routine w reflex microscopic  - CBC with Differential/Platelet - BASIC METABOLIC PANEL WITH GFR - Hepatic function panel - Magnesium  9. Other fatigue  - Vitamin B12 - Iron and TIBC - CBC with Differential/Platelet - TSH  10. Screening examination for pulmonary tuberculosis  - PPD   Continue prudent diet as discussed, weight control, BP monitoring, regular exercise, and medications. Discussed med's effects and SE's. Screening labs and tests as requested with regular follow-up as recommended. Over 40 minutes of exam, counseling, chart review and high complex critical decision making was performed.

## 2015-12-26 NOTE — Patient Instructions (Signed)
Recommend Adult Low Dose Aspirin or   coated  Aspirin 81 mg daily   To reduce risk of Colon Cancer 20 %,   Skin Cancer 26 % ,   Melanoma 46%   and   Pancreatic cancer 60%   ++++++++++++++++++++++++++++++++++++++++++++++++++++++ Vitamin D goal   is between 70-100.   Please make sure that you are taking your Vitamin D as directed.   It is very important as a natural anti-inflammatory   helping hair, skin, and nails, as well as reducing stroke and heart attack risk.   It helps your bones and helps with mood.  It also decreases numerous cancer risks so please take it as directed.   Low Vit D is associated with a 200-300% higher risk for CANCER   and 200-300% higher risk for HEART   ATTACK  &  STROKE.   .....................................Marland Kitchen  It is also associated with higher death rate at younger ages,   autoimmune diseases like Rheumatoid arthritis, Lupus, Multiple Sclerosis.     Also many other serious conditions, like depression, Alzheimer's  Dementia, infertility, muscle aches, fatigue, fibromyalgia - just to name a few.  ++++++++++++++++++++++++++++++++++++++++++++++++  Recommend the book "The END of DIETING" by Dr Excell Seltzer   & the book "The END of DIABETES " by Dr Excell Seltzer  At [email or web address] - get book & Audio CD's     Being diabetic has a  300% increased risk for heart attack, stroke, cancer, and alzheimer- type vascular dementia. It is very important that you work harder with diet by avoiding all foods that are white. Avoid white rice (brown & wild rice is OK), white potatoes (sweetpotatoes in moderation is OK), White bread or wheat bread or anything made out of white flour like bagels, donuts, rolls, buns, biscuits, cakes, pastries, cookies, pizza crust, and pasta (made from white flour & egg whites) - vegetarian pasta or spinach or wheat pasta is OK. Multigrain breads like Arnold's or Pepperidge [employer], or multigrain sandwich thins or flatbreads.  Diet,  exercise and weight loss can reverse and cure diabetes in the early stages.  Diet, exercise and weight loss is very important in the control and prevention of complications of diabetes which affects every system in your body, ie. Brain - dementia/stroke, eyes - glaucoma/blindness, heart - heart attack/heart failure, kidneys - dialysis, stomach - gastric paralysis, intestines - malabsorption, nerves - severe painful neuritis, circulation - gangrene & loss of a leg(s), and finally cancer and Alzheimers.    I recommend avoid fried & greasy foods,  sweets/candy, white rice (brown or wild rice or Quinoa is OK), white potatoes (sweet potatoes are OK) - anything made from white flour - bagels, doughnuts, rolls, buns, biscuits,white and wheat breads, pizza crust and traditional pasta made of white flour & egg white(vegetarian pasta or spinach or wheat pasta is OK).  Multi-grain bread is OK - like multi-grain flat bread or sandwich thins. Avoid alcohol in excess. Exercise is also important.    Eat all the vegetables you want - avoid meat, especially red meat and dairy - especially cheese.  Cheese is the most concentrated form of trans-fats which is the worst thing to clog up our arteries. Veggie cheese is OK which can be found in the fresh produce section at [employer] or [employer] or [employer]  ++++++++++++++++++++++++++++++++++++++++++++++++++ DASH Eating Plan  DASH stands for "Dietary Approaches to Stop Hypertension."   The DASH eating plan is a healthy eating plan that has been shown to reduce high blood  pressure (hypertension). Additional health benefits may include reducing the risk of type 2 diabetes mellitus, heart disease, and stroke. The DASH eating plan may also help with weight loss.  WHAT DO I NEED TO KNOW ABOUT THE DASH EATING PLAN?  For the DASH eating plan, you will follow these general guidelines:  Choose foods with a percent daily value for sodium of less than 5% (as listed on the food  label).  Use salt-free seasonings or herbs instead of table salt or sea salt.  Check with your health care provider or pharmacist before using salt substitutes.  Eat lower-sodium products, often labeled as "lower sodium" or "no salt added."  Eat fresh foods.  Eat more vegetables, fruits, and low-fat dairy products.    Choose whole grains. Look for the word "whole" as the first word in the ingredient list.  Choose fish   Limit sweets, desserts, sugars, and sugary drinks.  Choose heart-healthy fats.  Eat veggie cheese   Eat more home-cooked food and less restaurant, buffet, and fast food.  Limit fried foods.  [place]uffaker foods using methods other than frying.  Limit canned vegetables. If you do use them, rinse them well to decrease the sodium.  When eating at a restaurant, ask that your food be prepared with less salt, or no salt if possible.                      WHAT FOODS CAN I EAT?  Read Dr Fara Olden Fuhrman's books on The End of Dieting & The End of Diabetes  Grains  Whole grain or whole wheat bread. Brown rice. Whole grain or whole wheat pasta. Quinoa, bulgur, and whole grain cereals. Low-sodium cereals. Corn or whole wheat flour tortillas. Whole grain cornbread. Whole grain crackers. Low-sodium crackers.  Vegetables  Fresh or frozen vegetables (raw, steamed, roasted, or grilled). Low-sodium or reduced-sodium tomato and vegetable juices. Low-sodium or reduced-sodium tomato sauce and paste. Low-sodium or reduced-sodium canned vegetables.   Fruits  All fresh, canned (in natural juice), or frozen fruits.  Protein Products   All fish and seafood.  Dried beans, peas, or lentils. Unsalted nuts and seeds. Unsalted canned beans.  Dairy  Low-fat dairy products, such as skim or 1% milk, 2% or reduced-fat cheeses, low-fat ricotta or cottage cheese, or plain low-fat yogurt. Low-sodium or reduced-sodium cheeses.  Fats and Oils  Tub margarines without trans fats. Light or  reduced-fat mayonnaise and salad dressings (reduced sodium). Avocado. Safflower, olive, or canola oils. Natural peanut or almond butter.  Other  Unsalted popcorn and pretzels. The items listed above may not be a complete list of recommended foods or beverages. Contact your dietitian for more options.  +++++++++++++++++++++++++++++++++++++++++++  WHAT FOODS ARE NOT RECOMMENDED?  Grains/ White flour or wheat flour  White bread. White pasta. White rice. Refined cornbread. Bagels and croissants. Crackers that contain trans fat.  Vegetables  Creamed or fried vegetables. Vegetables in a . Regular canned vegetables. Regular canned tomato sauce and paste. Regular tomato and vegetable juices.  Fruits  Dried fruits. Canned fruit in light or heavy syrup. Fruit juice.  Meat and Other Protein Products  Meat in general - RED mwaet & White meat.  Fatty cuts of meat. Ribs, chicken wings, bacon, sausage, bologna, salami, chitterlings, fatback, hot dogs, bratwurst, and packaged luncheon meats.  Dairy  Whole or 2% milk, cream, half-and-half, and cream cheese. Whole-fat or sweetened yogurt. Full-fat cheeses or blue cheese. Nondairy creamers and whipped toppings. Processed cheese, cheese spreads, or  cheese curds.  Condiments  Onion and garlic salt, seasoned salt, table salt, and sea salt. Canned and packaged gravies. Worcestershire sauce. Tartar sauce. Barbecue sauce. Teriyaki sauce. Soy sauce, including reduced sodium. Steak sauce. Fish sauce. Oyster sauce. Cocktail sauce. Horseradish. Ketchup and mustard. Meat flavorings and tenderizers. Bouillon cubes. Hot sauce. Tabasco sauce. Marinades. Taco seasonings. Relishes.  Fats and Oils Butter, stick margarine, lard, shortening and bacon fat. Coconut, palm kernel, or palm oils. Regular salad dressings.  Pickles and olives. Salted popcorn and pretzels.  The items listed above may not be a complete list of foods and beverages to  avoid.  ++++++++++++++++++++++++++++++++++++++++++++++  Preventive Care for Adults  A healthy lifestyle and preventive care can promote health and wellness. Preventive health guidelines for women include the following key practices.  A routine yearly physical is a good way to check with your health care provider about your health and preventive screening. It is a chance to share any concerns and updates on your health and to receive a thorough exam.  Visit your dentist for a routine exam and preventive care every 6 months. Brush your teeth twice a day and floss once a day. Good oral hygiene prevents tooth decay and gum disease.  The frequency of eye exams is based on your age, health, family medical history, use of contact lenses, and other factors. Follow your health care provider's recommendations for frequency of eye exams.  Eat a healthy diet. Foods like vegetables, fruits, whole grains, low-fat dairy products, and lean protein foods contain the nutrients you need without too many calories. Decrease your intake of foods high in solid fats, added sugars, and salt. Eat the right amount of calories for you.Get information about a proper diet from your health care provider, if necessary.  Regular physical exercise is one of the most important things you can do for your health. Most adults should get at least 150 minutes of moderate-intensity exercise (any activity that increases your heart rate and causes you to sweat) each week. In addition, most adults need muscle-strengthening exercises on 2 or more days a week.  Maintain a healthy weight. The body mass index (BMI) is a screening tool to identify possible weight problems. It provides an estimate of body fat based on height and weight. Your health care provider can find your BMI and can help you achieve or maintain a healthy weight.For adults 20 years and older:  A BMI below 18.5 is considered underweight.  A BMI of 18.5 to 24.9 is  normal.  A BMI of 25 to 29.9 is considered overweight.  A BMI of 30 and above is considered obese.  Maintain normal blood lipids and cholesterol levels by exercising and minimizing your intake of saturated fat. Eat a balanced diet with plenty of fruit and vegetables. If your lipid or cholesterol levels are high, you are over 50, or you are at high risk for heart disease, you may need your cholesterol levels checked more frequently.Ongoing high lipid and cholesterol levels should be treated with medicines if diet and exercise are not working.  If you smoke, find out from your health care provider how to quit. If you do not use tobacco, do not start.  Lung cancer screening is recommended for adults aged 70-80 years who are at high risk for developing lung cancer because of a history of smoking. A yearly low-dose CT scan of the lungs is recommended for people who have at least a 30-pack-year history of smoking and are a current smoker  or have quit within the past 15 years. A pack year of smoking is smoking an average of 1 pack of cigarettes a day for 1 year (for example: 1 pack a day for 30 years or 2 packs a day for 15 years). Yearly screening should continue until the smoker has stopped smoking for at least 15 years. Yearly screening should be stopped for people who develop a health problem that would prevent them from having lung cancer treatment.  Avoid use of street drugs. Do not share needles with anyone. Ask for help if you need support or instructions about stopping the use of drugs.  High blood pressure causes heart disease and increases the risk of stroke.  Ongoing high blood pressure should be treated with medicines if weight loss and exercise do not work.  If you are 69-2 years old, ask your health care provider if you should take aspirin to prevent strokes.  Diabetes screening involves taking a blood sample to check your fasting blood sugar level. This should be done once every 3 years,  after age 59, if you are within normal weight and without risk factors for diabetes. Testing should be considered at a younger age or be carried out more frequently if you are overweight and have at least 1 risk factor for diabetes.  Breast cancer screening is essential preventive care for women. You should practice "breast self-awareness." This means understanding the normal appearance and feel of your breasts and may include breast self-examination. Any changes detected, no matter how small, should be reported to a health care provider. Women in their 46s and 30s should have a clinical breast exam (CBE) by a health care provider as part of a regular health exam every 1 to 3 years. After age 34, women should have a CBE every year. Starting at age 45, women should consider having a mammogram (breast X-ray test) every year. Women who have a family history of breast cancer should talk to their health care provider about genetic screening. Women at a high risk of breast cancer should talk to their health care providers about having an MRI and a mammogram every year.  Breast cancer gene (BRCA)-related cancer risk assessment is recommended for women who have family members with BRCA-related cancers. BRCA-related cancers include breast, ovarian, tubal, and peritoneal cancers. Having family members with these cancers may be associated with an increased risk for harmful changes (mutations) in the breast cancer genes BRCA1 and BRCA2. Results of the assessment will determine the need for genetic counseling and BRCA1 and BRCA2 testing.  Routine pelvic exams to screen for cancer are no longer recommended for nonpregnant women who are considered low risk for cancer of the pelvic organs (ovaries, uterus, and vagina) and who do not have symptoms. Ask your health care provider if a screening pelvic exam is right for you.  If you have had past treatment for cervical cancer or a condition that could lead to cancer, you need  Pap tests and screening for cancer for at least 20 years after your treatment. If Pap tests have been discontinued, your risk factors (such as having a new sexual partner) need to be reassessed to determine if screening should be resumed. Some women have medical problems that increase the chance of getting cervical cancer. In these cases, your health care provider may recommend more frequent screening and Pap tests.    Colorectal cancer can be detected and often prevented. Most routine colorectal cancer screening begins at the age of 75 years  and continues through age 49 years. However, your health care provider may recommend screening at an earlier age if you have risk factors for colon cancer. On a yearly basis, your health care provider may provide home test kits to check for hidden blood in the stool. Use of a small camera at the end of a tube, to directly examine the colon (sigmoidoscopy or colonoscopy), can detect the earliest forms of colorectal cancer. Talk to your health care provider about this at age 82, when routine screening begins. Direct exam of the colon should be repeated every 5-10 years through age 16 years, unless early forms of pre-cancerous polyps or small growths are found.  Osteoporosis is a disease in which the bones lose minerals and strength with aging. This can result in serious bone fractures or breaks. The risk of osteoporosis can be identified using a bone density scan. Women ages 82 years and over and women at risk for fractures or osteoporosis should discuss screening with their health care providers. Ask your health care provider whether you should take a calcium supplement or vitamin D to reduce the rate of osteoporosis.  Menopause can be associated with physical symptoms and risks. Hormone replacement therapy is available to decrease symptoms and risks. You should talk to your health care provider about whether hormone replacement therapy is right for you.  Use  sunscreen. Apply sunscreen liberally and repeatedly throughout the day. You should seek shade when your shadow is shorter than you. Protect yourself by wearing long sleeves, pants, a wide-brimmed hat, and sunglasses year round, whenever you are outdoors.  Once a month, do a whole body skin exam, using a mirror to look at the skin on your back. Tell your health care provider of new moles, moles that have irregular borders, moles that are larger than a pencil eraser, or moles that have changed in shape or color.  Stay current with required vaccines (immunizations).  Influenza vaccine. All adults should be immunized every year.  Tetanus, diphtheria, and acellular pertussis (Td, Tdap) vaccine. Pregnant women should receive 1 dose of Tdap vaccine during each pregnancy. The dose should be obtained regardless of the length of time since the last dose. Immunization is preferred during the 27th-36th week of gestation. An adult who has not previously received Tdap or who does not know her vaccine status should receive 1 dose of Tdap. This initial dose should be followed by tetanus and diphtheria toxoids (Td) booster doses every 10 years. Adults with an unknown or incomplete history of completing a 3-dose immunization series with Td-containing vaccines should begin or complete a primary immunization series including a Tdap dose. Adults should receive a Td booster every 10 years.    Zoster vaccine. One dose is recommended for adults aged 23 years or older unless certain conditions are present.    Pneumococcal 13-valent conjugate (PCV13) vaccine. When indicated, a person who is uncertain of her immunization history and has no record of immunization should receive the PCV13 vaccine. An adult aged 66 years or older who has certain medical conditions and has not been previously immunized should receive 1 dose of PCV13 vaccine. This PCV13 should be followed with a dose of pneumococcal polysaccharide (PPSV23) vaccine.  The PPSV23 vaccine dose should be obtained at least 8 weeks after the dose of PCV13 vaccine. An adult aged 13 years or older who has certain medical conditions and previously received 1 or more doses of PPSV23 vaccine should receive 1 dose of PCV13. The PCV13 vaccine dose  should be obtained 1 or more years after the last PPSV23 vaccine dose.    Pneumococcal polysaccharide (PPSV23) vaccine. When PCV13 is also indicated, PCV13 should be obtained first. All adults aged 46 years and older should be immunized. An adult younger than age 54 years who has certain medical conditions should be immunized. Any person who resides in a nursing home or long-term care facility should be immunized. An adult smoker should be immunized. People with an immunocompromised condition and certain other conditions should receive both PCV13 and PPSV23 vaccines. People with human immunodeficiency virus (HIV) infection should be immunized as soon as possible after diagnosis. Immunization during chemotherapy or radiation therapy should be avoided. Routine use of PPSV23 vaccine is not recommended for American Indians, [place] Natives, or people younger than 65 years unless there are medical conditions that require PPSV23 vaccine. When indicated, people who have unknown immunization and have no record of immunization should receive PPSV23 vaccine. One-time revaccination 5 years after the first dose of PPSV23 is recommended for people aged 19-64 years who have chronic kidney failure, nephrotic syndrome, asplenia, or immunocompromised conditions. People who received 1-2 doses of PPSV23 before age 56 years should receive another dose of PPSV23 vaccine at age 31 years or later if at least 5 years have passed since the previous dose. Doses of PPSV23 are not needed for people immunized with PPSV23 at or after age 62 years.   Preventive Services / Frequency  Ages 3 years and over  Blood pressure check.  Lipid and cholesterol check.  Lung  cancer screening. / Every year if you are aged 36-80 years and have a 30-pack-year history of smoking and currently smoke or have quit within the past 15 years. Yearly screening is stopped once you have quit smoking for at least 15 years or develop a health problem that would prevent you from having lung cancer treatment.  Clinical breast exam.** / Every year after age 5 years.  BRCA-related cancer risk assessment.** / For women who have family members with a BRCA-related cancer (breast, ovarian, tubal, or peritoneal cancers).  Mammogram.** / Every year beginning at age 76 years and continuing for as long as you are in good health. Consult with your health care provider.  Pap test.** / Every 3 years starting at age 38 years through age 15 or 28 years with 3 consecutive normal Pap tests. Testing can be stopped between 65 and 70 years with 3 consecutive normal Pap tests and no abnormal Pap or HPV tests in the past 10 years.  Fecal occult blood test (FOBT) of stool. / Every year beginning at age 11 years and continuing until age 68 years. You may not need to do this test if you get a colonoscopy every 10 years.  Flexible sigmoidoscopy or colonoscopy.** / Every 5 years for a flexible sigmoidoscopy or every 10 years for a colonoscopy beginning at age 96 years and continuing until age 84 years.  Hepatitis C blood test.** / For all people born from [birth year] through 1964 and any individual with known risks for hepatitis C.  Osteoporosis screening.** / A one-time screening for women ages 29 years and over and women at risk for fractures or osteoporosis.  Skin self-exam. / Monthly.  Influenza vaccine. / Every year.  Tetanus, diphtheria, and acellular pertussis (Tdap/Td) vaccine.** / 1 dose of Td every 10 years.  Zoster vaccine.** / 1 dose for adults aged 79 years or older.  Pneumococcal 13-valent conjugate (PCV13) vaccine.** / Consult your health care provider.  Pneumococcal polysaccharide (PPSV23)  vaccine.** / 1 dose for all adults aged 83 years and older. Screening for abdominal aortic aneurysm (AAA)  by ultrasound is recommended for people who have history of high blood pressure or who are current or former smokers.

## 2015-12-27 LAB — URINALYSIS, ROUTINE W REFLEX MICROSCOPIC
Bilirubin Urine: NEGATIVE
GLUCOSE, UA: NEGATIVE
HGB URINE DIPSTICK: NEGATIVE
KETONES UR: NEGATIVE
LEUKOCYTES UA: NEGATIVE
Nitrite: NEGATIVE
PH: 6.5 (ref 5.0–8.0)
PROTEIN: NEGATIVE
Specific Gravity, Urine: 1.011 (ref 1.001–1.035)

## 2015-12-27 LAB — HEMOGLOBIN A1C
Hgb A1c MFr Bld: 5.9 % — ABNORMAL HIGH (ref <5.7)
MEAN PLASMA GLUCOSE: 123 mg/dL

## 2015-12-27 LAB — VITAMIN D 25 HYDROXY (VIT D DEFICIENCY, FRACTURES): Vit D, 25-Hydroxy: 64 ng/mL (ref 30–100)

## 2015-12-27 LAB — TSH: TSH: 0.88 mIU/L

## 2015-12-27 LAB — MICROALBUMIN / CREATININE URINE RATIO: CREATININE, URINE: 52 mg/dL (ref 20–320)

## 2015-12-27 LAB — INSULIN, RANDOM: Insulin: 37.9 u[IU]/mL — ABNORMAL HIGH (ref 2.0–19.6)

## 2016-01-09 ENCOUNTER — Other Ambulatory Visit: Payer: Self-pay | Admitting: Radiology

## 2016-01-23 ENCOUNTER — Other Ambulatory Visit: Payer: Self-pay | Admitting: General Surgery

## 2016-01-23 DIAGNOSIS — D241 Benign neoplasm of right breast: Secondary | ICD-10-CM

## 2016-01-30 ENCOUNTER — Other Ambulatory Visit (INDEPENDENT_AMBULATORY_CARE_PROVIDER_SITE_OTHER): Payer: Commercial Managed Care - HMO

## 2016-01-30 DIAGNOSIS — Z1212 Encounter for screening for malignant neoplasm of rectum: Secondary | ICD-10-CM | POA: Diagnosis not present

## 2016-01-30 DIAGNOSIS — Z0001 Encounter for general adult medical examination with abnormal findings: Secondary | ICD-10-CM

## 2016-01-30 LAB — POC HEMOCCULT BLD/STL (HOME/3-CARD/SCREEN)
Card #2 Fecal Occult Blod, POC: NEGATIVE
Card #3 Fecal Occult Blood, POC: NEGATIVE
FECAL OCCULT BLD: NEGATIVE

## 2016-02-18 ENCOUNTER — Encounter: Payer: Self-pay | Admitting: *Deleted

## 2016-03-03 ENCOUNTER — Encounter (HOSPITAL_BASED_OUTPATIENT_CLINIC_OR_DEPARTMENT_OTHER): Payer: Self-pay | Admitting: *Deleted

## 2016-03-10 NOTE — Progress Notes (Signed)
Pt given an 8 oz carton of boost breeze with verbal and written instructions to drink by 415 morning of surgery and nothing else after midnight.  Teach back ,  Pt voiced understanding

## 2016-03-10 NOTE — Anesthesia Preprocedure Evaluation (Addendum)
Anesthesia Evaluation  Patient identified by MRN, date of birth, ID band Patient awake    Reviewed: Allergy & Precautions, NPO status , Patient's Chart, lab work & pertinent test results  History of Anesthesia Complications Negative for: history of anesthetic complications  Airway Mallampati: II  TM Distance: >3 FB Neck ROM: Full    Dental no notable dental hx. (+) Dental Advisory Given, Teeth Intact, Implants,    Pulmonary neg pulmonary ROS,    Pulmonary exam normal breath sounds clear to auscultation       Cardiovascular Exercise Tolerance: Good negative cardio ROS Normal cardiovascular exam Rhythm:Regular Rate:Normal     Neuro/Psych PSYCHIATRIC DISORDERS Anxiety Depression negative neurological ROS  negative psych ROS   GI/Hepatic Neg liver ROS, GERD  Controlled and Medicated,  Endo/Other  negative endocrine ROS  Renal/GU negative Renal ROS  negative genitourinary   Musculoskeletal  (+) Arthritis ,   Abdominal   Peds negative pediatric ROS (+)  Hematology negative hematology ROS (+)   Anesthesia Other Findings   Reproductive/Obstetrics negative OB ROS                          Anesthesia Physical Anesthesia Plan  ASA: II  Anesthesia Plan: General   Post-op Pain Management:    Induction: Intravenous  Airway Management Planned: Oral ETT  Additional Equipment:   Intra-op Plan:   Post-operative Plan: Extubation in OR  Informed Consent: I have reviewed the patients History and Physical, chart, labs and discussed the procedure including the risks, benefits and alternatives for the proposed anesthesia with the patient or authorized representative who has indicated his/her understanding and acceptance.   Dental advisory given  Plan Discussed with: CRNA  Anesthesia Plan Comments:        Anesthesia Quick Evaluation

## 2016-03-11 ENCOUNTER — Ambulatory Visit (HOSPITAL_BASED_OUTPATIENT_CLINIC_OR_DEPARTMENT_OTHER): Payer: Commercial Managed Care - HMO | Admitting: Anesthesiology

## 2016-03-11 ENCOUNTER — Encounter (HOSPITAL_BASED_OUTPATIENT_CLINIC_OR_DEPARTMENT_OTHER)
Admission: RE | Disposition: A | Discharge status: Home / Self Care | Payer: Self-pay | Source: Ambulatory Visit | Attending: General Surgery

## 2016-03-11 ENCOUNTER — Ambulatory Visit (HOSPITAL_BASED_OUTPATIENT_CLINIC_OR_DEPARTMENT_OTHER)
Admission: RE | Admit: 2016-03-11 | Discharge: 2016-03-11 | Disposition: A | Discharge status: Home / Self Care | Payer: Commercial Managed Care - HMO | Source: Ambulatory Visit | Attending: General Surgery | Admitting: General Surgery

## 2016-03-11 ENCOUNTER — Encounter (HOSPITAL_BASED_OUTPATIENT_CLINIC_OR_DEPARTMENT_OTHER): Payer: Self-pay

## 2016-03-11 DIAGNOSIS — Z833 Family history of diabetes mellitus: Secondary | ICD-10-CM | POA: Diagnosis not present

## 2016-03-11 DIAGNOSIS — E7800 Pure hypercholesterolemia, unspecified: Secondary | ICD-10-CM | POA: Insufficient documentation

## 2016-03-11 DIAGNOSIS — R011 Cardiac murmur, unspecified: Secondary | ICD-10-CM | POA: Insufficient documentation

## 2016-03-11 DIAGNOSIS — Z7982 Long term (current) use of aspirin: Secondary | ICD-10-CM | POA: Insufficient documentation

## 2016-03-11 DIAGNOSIS — Z79899 Other long term (current) drug therapy: Secondary | ICD-10-CM | POA: Diagnosis not present

## 2016-03-11 DIAGNOSIS — M1990 Unspecified osteoarthritis, unspecified site: Secondary | ICD-10-CM | POA: Insufficient documentation

## 2016-03-11 DIAGNOSIS — Z882 Allergy status to sulfonamides status: Secondary | ICD-10-CM | POA: Insufficient documentation

## 2016-03-11 DIAGNOSIS — K219 Gastro-esophageal reflux disease without esophagitis: Secondary | ICD-10-CM | POA: Insufficient documentation

## 2016-03-11 DIAGNOSIS — N641 Fat necrosis of breast: Secondary | ICD-10-CM | POA: Insufficient documentation

## 2016-03-11 DIAGNOSIS — E119 Type 2 diabetes mellitus without complications: Secondary | ICD-10-CM | POA: Diagnosis not present

## 2016-03-11 DIAGNOSIS — D241 Benign neoplasm of right breast: Secondary | ICD-10-CM | POA: Diagnosis present

## 2016-03-11 DIAGNOSIS — M549 Dorsalgia, unspecified: Secondary | ICD-10-CM | POA: Diagnosis not present

## 2016-03-11 DIAGNOSIS — R079 Chest pain, unspecified: Secondary | ICD-10-CM | POA: Diagnosis not present

## 2016-03-11 DIAGNOSIS — F329 Major depressive disorder, single episode, unspecified: Secondary | ICD-10-CM | POA: Insufficient documentation

## 2016-03-11 HISTORY — PX: RADIOACTIVE SEED GUIDED EXCISIONAL BREAST BIOPSY: SHX6490

## 2016-03-11 SURGERY — RADIOACTIVE SEED GUIDED BREAST BIOPSY
Anesthesia: General | Site: Breast | Laterality: Right

## 2016-03-11 MED ORDER — ONDANSETRON HCL 4 MG/2ML IJ SOLN: 4.0000 mg | Freq: Once | INTRAMUSCULAR | Status: DC | PRN

## 2016-03-11 MED ORDER — ONDANSETRON HCL 4 MG/2ML IJ SOLN
INTRAMUSCULAR | Status: AC
  Filled 2016-03-11: qty 2

## 2016-03-11 MED ORDER — GLYCOPYRROLATE 0.2 MG/ML IJ SOLN: 0.2000 mg | Freq: Once | INTRAMUSCULAR | Status: DC | PRN

## 2016-03-11 MED ORDER — MIDAZOLAM HCL 2 MG/2ML IJ SOLN
1.0000 mg | INTRAMUSCULAR | Status: DC | PRN
  Administered 2016-03-11: 2 mg via INTRAVENOUS

## 2016-03-11 MED ORDER — CEFAZOLIN SODIUM-DEXTROSE 2-4 GM/100ML-% IV SOLN
2.0000 g | INTRAVENOUS | Status: AC
  Administered 2016-03-11: 2 g via INTRAVENOUS

## 2016-03-11 MED ORDER — ACETAMINOPHEN 500 MG PO TABS
1000.0000 mg | ORAL_TABLET | ORAL | Status: AC
  Administered 2016-03-11: 1000 mg via ORAL

## 2016-03-11 MED ORDER — OXYCODONE HCL 5 MG PO TABS: 5.0000 mg | ORAL_TABLET | Freq: Once | ORAL | Status: DC | PRN

## 2016-03-11 MED ORDER — BUPIVACAINE HCL (PF) 0.25 % IJ SOLN
INTRAMUSCULAR | Status: DC | PRN
  Administered 2016-03-11: 10 mL

## 2016-03-11 MED ORDER — FENTANYL CITRATE (PF) 100 MCG/2ML IJ SOLN
INTRAMUSCULAR | Status: AC
  Filled 2016-03-11: qty 2

## 2016-03-11 MED ORDER — CEFAZOLIN SODIUM-DEXTROSE 2-4 GM/100ML-% IV SOLN
INTRAVENOUS | Status: AC
  Filled 2016-03-11: qty 100

## 2016-03-11 MED ORDER — GABAPENTIN 300 MG PO CAPS
ORAL_CAPSULE | ORAL | Status: AC
  Filled 2016-03-11: qty 1

## 2016-03-11 MED ORDER — PROPOFOL 10 MG/ML IV BOLUS
INTRAVENOUS | Status: AC
  Filled 2016-03-11: qty 40

## 2016-03-11 MED ORDER — SCOPOLAMINE 1 MG/3DAYS TD PT72
MEDICATED_PATCH | TRANSDERMAL | Status: AC
  Filled 2016-03-11: qty 1

## 2016-03-11 MED ORDER — ACETAMINOPHEN 500 MG PO TABS
ORAL_TABLET | ORAL | Status: AC
  Filled 2016-03-11: qty 2

## 2016-03-11 MED ORDER — SUCCINYLCHOLINE CHLORIDE 20 MG/ML IJ SOLN: INTRAMUSCULAR | Status: DC | PRN

## 2016-03-11 MED ORDER — LIDOCAINE 2% (20 MG/ML) 5 ML SYRINGE
INTRAMUSCULAR | Status: AC
  Filled 2016-03-11: qty 10

## 2016-03-11 MED ORDER — NALOXONE HCL 0.4 MG/ML IJ SOLN
INTRAMUSCULAR | Status: DC | PRN
  Administered 2016-03-11 (×3): 50 ug via INTRAVENOUS

## 2016-03-11 MED ORDER — PHENYLEPHRINE HCL 10 MG/ML IJ SOLN
INTRAMUSCULAR | Status: DC | PRN
  Administered 2016-03-11: 80 ug via INTRAVENOUS
  Administered 2016-03-11 (×2): 40 ug via INTRAVENOUS

## 2016-03-11 MED ORDER — LACTATED RINGERS IV SOLN
INTRAVENOUS | Status: DC
  Administered 2016-03-11: 07:00:00 via INTRAVENOUS

## 2016-03-11 MED ORDER — PROPOFOL 500 MG/50ML IV EMUL
INTRAVENOUS | Status: DC | PRN
  Administered 2016-03-11: 120 mL via INTRAVENOUS

## 2016-03-11 MED ORDER — LIDOCAINE 2% (20 MG/ML) 5 ML SYRINGE
INTRAMUSCULAR | Status: DC | PRN
  Administered 2016-03-11: 100 mg via INTRAVENOUS

## 2016-03-11 MED ORDER — LIDOCAINE HCL (CARDIAC) 20 MG/ML IV SOLN: INTRAVENOUS | Status: DC | PRN

## 2016-03-11 MED ORDER — SCOPOLAMINE 1 MG/3DAYS TD PT72
1.0000 | MEDICATED_PATCH | Freq: Once | TRANSDERMAL | Status: DC | PRN
Start: 2016-03-11 — End: 2016-03-11

## 2016-03-11 MED ORDER — DEXAMETHASONE SODIUM PHOSPHATE 10 MG/ML IJ SOLN
INTRAMUSCULAR | Status: AC
  Filled 2016-03-11: qty 1

## 2016-03-11 MED ORDER — DEXAMETHASONE SODIUM PHOSPHATE 10 MG/ML IJ SOLN
INTRAMUSCULAR | Status: DC | PRN
  Administered 2016-03-11: 10 mg via INTRAVENOUS

## 2016-03-11 MED ORDER — SUCCINYLCHOLINE CHLORIDE 20 MG/ML IJ SOLN
INTRAMUSCULAR | Status: DC | PRN
  Administered 2016-03-11: 80 mg via INTRAVENOUS

## 2016-03-11 MED ORDER — OXYCODONE HCL 5 MG/5ML PO SOLN: 5.0000 mg | Freq: Once | ORAL | Status: DC | PRN

## 2016-03-11 MED ORDER — SUCCINYLCHOLINE CHLORIDE 200 MG/10ML IV SOSY
PREFILLED_SYRINGE | INTRAVENOUS | Status: DC | PRN
  Administered 2016-03-11: 80 mg via INTRAVENOUS

## 2016-03-11 MED ORDER — GABAPENTIN 300 MG PO CAPS
300.0000 mg | ORAL_CAPSULE | ORAL | Status: AC
  Administered 2016-03-11: 300 mg via ORAL

## 2016-03-11 MED ORDER — FENTANYL CITRATE (PF) 100 MCG/2ML IJ SOLN
50.0000 ug | INTRAMUSCULAR | Status: DC | PRN
  Administered 2016-03-11: 50 ug via INTRAVENOUS

## 2016-03-11 MED ORDER — FENTANYL CITRATE (PF) 100 MCG/2ML IJ SOLN: 25.0000 ug | INTRAMUSCULAR | Status: DC | PRN

## 2016-03-11 MED ORDER — ONDANSETRON HCL 4 MG/2ML IJ SOLN
INTRAMUSCULAR | Status: DC | PRN
  Administered 2016-03-11: 4 mg via INTRAVENOUS

## 2016-03-11 MED ORDER — MIDAZOLAM HCL 2 MG/2ML IJ SOLN
INTRAMUSCULAR | Status: AC
  Filled 2016-03-11: qty 2

## 2016-03-11 SURGICAL SUPPLY — 61 items
ADH SKN CLS APL DERMABOND .7 (GAUZE/BANDAGES/DRESSINGS) ×1
BINDER BREAST LRG (GAUZE/BANDAGES/DRESSINGS) IMPLANT
BINDER BREAST MEDIUM (GAUZE/BANDAGES/DRESSINGS) ×1 IMPLANT
BINDER BREAST XLRG (GAUZE/BANDAGES/DRESSINGS) IMPLANT
BINDER BREAST XXLRG (GAUZE/BANDAGES/DRESSINGS) IMPLANT
BLADE SURG 15 STRL LF DISP TIS (BLADE) ×1 IMPLANT
BLADE SURG 15 STRL SS (BLADE) ×2
CANISTER SUC SOCK COL 7IN (MISCELLANEOUS) IMPLANT
CANISTER SUCT 1200ML W/VALVE (MISCELLANEOUS) IMPLANT
CHLORAPREP W/TINT 26ML (MISCELLANEOUS) ×2 IMPLANT
CLIP TI WIDE RED SMALL 6 (CLIP) IMPLANT
COVER BACK TABLE 60X90IN (DRAPES) ×2 IMPLANT
COVER MAYO STAND STRL (DRAPES) ×2 IMPLANT
COVER PROBE W GEL 5X96 (DRAPES) ×2 IMPLANT
DECANTER SPIKE VIAL GLASS SM (MISCELLANEOUS) IMPLANT
DERMABOND ADVANCED (GAUZE/BANDAGES/DRESSINGS) ×1
DERMABOND ADVANCED .7 DNX12 (GAUZE/BANDAGES/DRESSINGS) ×1 IMPLANT
DEVICE DUBIN W/COMP PLATE 8390 (MISCELLANEOUS) ×2 IMPLANT
DRAPE LAPAROSCOPIC ABDOMINAL (DRAPES) ×2 IMPLANT
DRAPE UTILITY XL STRL (DRAPES) ×2 IMPLANT
DRSG TEGADERM 4X4.75 (GAUZE/BANDAGES/DRESSINGS) IMPLANT
ELECT COATED BLADE 2.86 ST (ELECTRODE) ×2 IMPLANT
ELECT REM PT RETURN 9FT ADLT (ELECTROSURGICAL) ×2
ELECTRODE REM PT RTRN 9FT ADLT (ELECTROSURGICAL) ×1 IMPLANT
GLOVE BIO SURGEON STRL SZ7 (GLOVE) ×4 IMPLANT
GLOVE BIO SURGEON STRL SZ7.5 (GLOVE) ×1 IMPLANT
GLOVE BIOGEL PI IND STRL 7.0 (GLOVE) IMPLANT
GLOVE BIOGEL PI IND STRL 7.5 (GLOVE) ×1 IMPLANT
GLOVE BIOGEL PI INDICATOR 7.0 (GLOVE) ×1
GLOVE BIOGEL PI INDICATOR 7.5 (GLOVE) ×2
GOWN STRL REUS W/ TWL LRG LVL3 (GOWN DISPOSABLE) ×2 IMPLANT
GOWN STRL REUS W/ TWL XL LVL3 (GOWN DISPOSABLE) IMPLANT
GOWN STRL REUS W/TWL 2XL LVL3 (GOWN DISPOSABLE) ×1 IMPLANT
GOWN STRL REUS W/TWL LRG LVL3 (GOWN DISPOSABLE) ×2
GOWN STRL REUS W/TWL XL LVL3 (GOWN DISPOSABLE) ×2
ILLUMINATOR WAVEGUIDE N/F (MISCELLANEOUS) IMPLANT
KIT MARKER MARGIN INK (KITS) ×2 IMPLANT
LIGHT WAVEGUIDE WIDE FLAT (MISCELLANEOUS) IMPLANT
NDL HYPO 25X1 1.5 SAFETY (NEEDLE) ×1 IMPLANT
NEEDLE HYPO 25X1 1.5 SAFETY (NEEDLE) ×2 IMPLANT
NS IRRIG 1000ML POUR BTL (IV SOLUTION) ×1 IMPLANT
PACK BASIN DAY SURGERY FS (CUSTOM PROCEDURE TRAY) ×2 IMPLANT
PENCIL BUTTON HOLSTER BLD 10FT (ELECTRODE) ×2 IMPLANT
SHEET MEDIUM DRAPE 40X70 STRL (DRAPES) IMPLANT
SLEEVE SCD COMPRESS KNEE MED (MISCELLANEOUS) ×2 IMPLANT
SPONGE GAUZE 4X4 12PLY STER LF (GAUZE/BANDAGES/DRESSINGS) IMPLANT
SPONGE LAP 4X18 X RAY DECT (DISPOSABLE) ×2 IMPLANT
STRIP CLOSURE SKIN 1/2X4 (GAUZE/BANDAGES/DRESSINGS) ×2 IMPLANT
SUT MNCRL AB 4-0 PS2 18 (SUTURE) IMPLANT
SUT MON AB 5-0 PS2 18 (SUTURE) ×1 IMPLANT
SUT SILK 2 0 SH (SUTURE) IMPLANT
SUT VIC AB 2-0 SH 27 (SUTURE) ×2
SUT VIC AB 2-0 SH 27XBRD (SUTURE) ×1 IMPLANT
SUT VIC AB 3-0 SH 27 (SUTURE) ×2
SUT VIC AB 3-0 SH 27X BRD (SUTURE) ×1 IMPLANT
SUT VIC AB 5-0 PS2 18 (SUTURE) IMPLANT
SYR CONTROL 10ML LL (SYRINGE) ×2 IMPLANT
TOWEL OR 17X24 6PK STRL BLUE (TOWEL DISPOSABLE) ×2 IMPLANT
TOWEL OR NON WOVEN STRL DISP B (DISPOSABLE) ×2 IMPLANT
TUBE CONNECTING 20X1/4 (TUBING) IMPLANT
YANKAUER SUCT BULB TIP NO VENT (SUCTIONS) IMPLANT

## 2016-03-11 NOTE — Transfer of Care (Signed)
Immediate Anesthesia Transfer of Care Note  Patient: [name]  Procedure(s) Performed: Procedure(s): RIGHT RADIOACTIVE SEED GUIDED EXCISIONAL BREAST BIOPSY (Right)  Patient Location: PACU  Anesthesia Type:General  Level of Consciousness: sedated and patient cooperative  Airway & Oxygen Therapy: Patient Spontanous Breathing and Patient connected to face mask oxygen  Post-op Assessment: Report given to RN and Post -op Vital signs reviewed and stable  Post vital signs: Reviewed and stable  Last Vitals:  Vitals:   03/11/16 0652  BP: 90/60  Pulse: (!) 57  Resp: 18  Temp: 36.6 C    Last Pain:  Vitals:   03/11/16 0652  TempSrc: Oral         Complications: No apparent anesthesia complications

## 2016-03-11 NOTE — Anesthesia Postprocedure Evaluation (Signed)
Anesthesia Post Note  Patient: [name]  Procedure(s) Performed: Procedure(s) (LRB): RIGHT RADIOACTIVE SEED GUIDED EXCISIONAL BREAST BIOPSY (Right)  Patient location during evaluation: PACU Anesthesia Type: General Level of consciousness: awake and alert Pain management: pain level controlled Vital Signs Assessment: post-procedure vital signs reviewed and stable Respiratory status: spontaneous breathing, nonlabored ventilation, respiratory function stable and patient connected to nasal cannula oxygen Cardiovascular status: blood pressure returned to baseline and stable Postop Assessment: no signs of nausea or vomiting Anesthetic complications: no    Last Vitals:  Vitals:   03/11/16 0900 03/11/16 0926  BP: (!) 97/59 121/68  Pulse: 60 66  Resp: 13 18  Temp:  36.5 C    Last Pain:  Vitals:   03/11/16 0926  TempSrc: Oral  PainSc: 3                  [name]

## 2016-03-11 NOTE — Interval H&P Note (Signed)
History and Physical Interval Note:  03/11/2016 7:15 AM  [name]  has presented today for surgery, with the diagnosis of RIGHT BREAST PAPILLOMA  The various methods of treatment have been discussed with the patient and family. After consideration of risks, benefits and other options for treatment, the patient has consented to  Procedure(s): RIGHT RADIOACTIVE SEED GUIDED EXCISIONAL BREAST BIOPSY (Right) as a surgical intervention .  The patient's history has been reviewed, patient examined, no change in status, stable for surgery.  I have reviewed the patient's chart and labs.  Questions were answered to the patient's satisfaction.     Robinson Brinkley

## 2016-03-11 NOTE — Discharge Instructions (Signed)
[facility] °Office Phone Number [phone] ° °POST OP INSTRUCTIONS ° °Always review your discharge instruction sheet given to you by the facility where your surgery was performed. ° °IF YOU HAVE DISABILITY OR FAMILY LEAVE FORMS, YOU MUST BRING THEM TO THE OFFICE FOR PROCESSING.  DO NOT GIVE THEM TO YOUR DOCTOR. ° °1. A prescription for pain medication may be given to you upon discharge.  Take your pain medication as prescribed, if needed.  If narcotic pain medicine is not needed, then you may take acetaminophen (Tylenol), naprosyn (Alleve) or ibuprofen (Advil) as needed. °2. Take your usually prescribed medications unless otherwise directed °3. If you need a refill on your pain medication, please contact your pharmacy.  They will contact our office to request authorization.  Prescriptions will not be filled after 5pm or on week-ends. °4. You should eat very light the first 24 hours after surgery, such as soup, crackers, pudding, etc.  Resume your normal diet the day after surgery. °5. Most patients will experience some swelling and bruising in the breast.  Ice packs and a good support bra will help.  Wear the breast binder provided or a sports bra for 72 hours day and night.  After that wear a sports bra during the day until you return to the office. Swelling and bruising can take several days to resolve.  °6. It is common to experience some constipation if taking pain medication after surgery.  Increasing fluid intake and taking a stool softener will usually help or prevent this problem from occurring.  A mild laxative (Milk of Magnesia or Miralax) should be taken according to package directions if there are no bowel movements after 48 hours. °7. Unless discharge instructions indicate otherwise, you may remove your bandages 48 hours after surgery and you may shower at that time.  You may have steri-strips (small skin tapes) in place directly over the incision.  These strips should be left on the  skin for 7-10 days and will come off on their own.  If your surgeon used skin glue on the incision, you may shower in 24 hours.  The glue will flake off over the next 2-3 weeks.  Any sutures or staples will be removed at the office during your follow-up visit. °8. ACTIVITIES:  You may resume regular daily activities (gradually increasing) beginning the next day.  Wearing a good support bra or sports bra minimizes pain and swelling.  You may have sexual intercourse when it is comfortable. °a. You may drive when you no longer are taking prescription pain medication, you can comfortably wear a seatbelt, and you can safely maneuver your car and apply brakes. °b. RETURN TO WORK:  ______________________________________________________________________________________ °9. You should see your doctor in the office for a follow-up appointment approximately two weeks after your surgery.  Your doctor’s nurse will typically make your follow-up appointment when she calls you with your pathology report.  Expect your pathology report 3-4 business days after your surgery.  You may call to check if you do not hear from us after three days. °10. OTHER INSTRUCTIONS: _______________________________________________________________________________________________ _____________________________________________________________________________________________________________________________________ °_____________________________________________________________________________________________________________________________________ °_____________________________________________________________________________________________________________________________________ ° °WHEN TO CALL DR WAKEFIELD: °1. Fever over 101.0 °2. Nausea and/or vomiting. °3. Extreme swelling or bruising. °4. Continued bleeding from incision. °5. Increased pain, redness, or drainage from the incision. ° °The clinic staff is available to answer your questions during regular  business hours.  Please don’t hesitate to call and ask to speak to one of the nurses for clinical concerns.  If   you have a medical emergency, go to the nearest emergency room or call 911.  A surgeon from [facility] is always on call at the hospital. ° °For further questions, please visit [email or web address] ° ° ° °Post Anesthesia Home Care Instructions ° °Activity: °Get plenty of rest for the remainder of the day. A responsible adult should stay with you for 24 hours following the procedure.  °For the next 24 hours, DO NOT: °-Drive a car °-Operate machinery °-Drink alcoholic beverages °-Take any medication unless instructed by your physician °-Make any legal decisions or sign important papers. ° °Meals: °Start with liquid foods such as gelatin or soup. Progress to regular foods as tolerated. Avoid greasy, spicy, heavy foods. If nausea and/or vomiting occur, drink only clear liquids until the nausea and/or vomiting subsides. Call your physician if vomiting continues. ° °Special Instructions/Symptoms: °Your throat may feel dry or sore from the anesthesia or the breathing tube placed in your throat during surgery. If this causes discomfort, gargle with warm salt water. The discomfort should disappear within 24 hours. ° °If you had a scopolamine patch placed behind your ear for the management of post- operative nausea and/or vomiting: ° °1. The medication in the patch is effective for 72 hours, after which it should be removed.  Wrap patch in a tissue and discard in the trash. Wash hands thoroughly with soap and water. °2. You may remove the patch earlier than 72 hours if you experience unpleasant side effects which may include dry mouth, dizziness or visual disturbances. °3. Avoid touching the patch. Wash your hands with soap and water after contact with the patch. °  ° °

## 2016-03-11 NOTE — H&P (Signed)
40 yof who is otherwise healthy presents after undergoing screening mm that shows d density breasts and had new calcifications in the right breast. the left breast is normal. she has no mass or discarge. no prior breast history and no family history. stereo biopsy was performed that shows intraductal papilloma. she is here to discuss options. she is seen in consultation from Dr Johnnette Gourd.   Other Problems  Back Pain Bladder Problems Chest pain Depression Diabetes Mellitus Gastroesophageal Reflux Disease Heart murmur Hemorrhoids Hypercholesterolemia  Past Surgical History  Breast Biopsy Right. Foot Surgery Bilateral.  Diagnostic Studies HistoryMammogram within last year  Allergies  Sulfacetamide *CHEMICALS*  Medication History Carafate (1GM/10ML Suspension, Oral) Active. Omeprazole (40MG  Capsule DR, Oral) Active. Oxybutynin Chloride (5MG  Tablet, Oral) Active. Meloxicam (15MG  Tablet, Oral) Active. Baby Aspirin (81MG  Tablet Chewable, Oral) Active. Vitamin D (Cholecalciferol) (1000UNIT Tablet, Oral) Active. Multivitamin Women (Oral) Active. Medications Reconciled  Social History  Alcohol use Occasional alcohol use. Caffeine use Coffee. No drug use Tobacco use Never smoker.  Family History  Diabetes Mellitus Father.  Pregnancy / Birth History Age at menarche 67 years. Age of menopause 10-50 Contraceptive History Oral contraceptives. Gravida 0 Para 0  Review of Systems General Present- Appetite Loss, Fatigue and Night Sweats. Not Present- Chills, Fever, Weight Gain and Weight Loss. HEENT Present- Hoarseness, Sinus Pain and Wears glasses/contact lenses. Not Present- Earache, Hearing Loss, Nose Bleed, Oral Ulcers, Ringing in the Ears, Seasonal Allergies, Sore Throat, Visual Disturbances and Yellow Eyes. Breast Present- Breast Pain. Not Present- Breast Mass, Nipple Discharge and Skin Changes. Cardiovascular Present- Chest Pain,  Leg Cramps and Swelling of Extremities. Not Present- Difficulty Breathing Lying Down, Palpitations, Rapid Heart Rate and Shortness of Breath. Gastrointestinal Present- Abdominal Pain, Change in Bowel Habits and Constipation. Not Present- Bloating, Bloody Stool, Chronic diarrhea, Difficulty Swallowing, Excessive gas, Gets full quickly at meals, Hemorrhoids, Indigestion, Nausea, Rectal Pain and Vomiting. Female Genitourinary Present- Frequency, Painful Urination, Pelvic Pain and Urgency. Not Present- Nocturia. Musculoskeletal Present- Back Pain, Joint Pain, Joint Stiffness and Muscle Pain. Not Present- Muscle Weakness and Swelling of Extremities. Neurological Present- Headaches and Tingling. Not Present- Decreased Memory, Fainting, Numbness, Seizures, Tremor, Trouble walking and Weakness. Psychiatric Present- Anxiety, Change in Sleep Pattern and Fearful. Not Present- Bipolar, Depression and Frequent crying. Endocrine Present- Cold Intolerance, Hair Changes and Hot flashes. Not Present- Excessive Hunger, Heat Intolerance and New Diabetes. Hematology Present- Easy Bruising. Not Present- Blood Thinners, Excessive bleeding, Gland problems, HIV and Persistent Infections.  Vitals Weight: 131 lb Height: 64.5in Body Surface Area: 1.64 m Body Mass Index: 22.14 kg/m  Pulse: 66 (Regular)  BP: 104/62 (Sitting, Left Arm, Standard)  Physical Exam  General Mental Status-Alert. Orientation-Oriented X3.  Chest and Lung Exam Chest and lung exam reveals -on auscultation, normal breath sounds, no adventitious sounds and normal vocal resonance.  Breast Nipples-No Discharge. Breast Lump-No Palpable Breast Mass. Note: large right breast hematoma   Cardiovascular Cardiovascular examination reveals -normal heart sounds, regular rate and rhythm with no murmurs.  Lymphatic Head & Neck  General Head & Neck Lymphatics: Bilateral - Description - Normal. Axillary  General Axillary  Region: Bilateral - Description - Normal. Note: no [place] adenopathy   Assessment & Plan INTRADUCTAL PAPILLOMA OF BREAST, RIGHT (D24.1) Story: Right breast seed guided excisional biopsy we discussed options including observation I told I six month follow up would not be unreasonable. she very much desires excision. we discussed seed guided excisional biopsy. will plan for a week off of work afterwards.  risks and recovery discussed.

## 2016-03-11 NOTE — Anesthesia Procedure Notes (Signed)
Procedure Name: Intubation Date/Time: 03/11/2016 7:39 AM Performed by: Wanita Chamberlain Pre-anesthesia Checklist: Timeout performed, Patient identified, Emergency Drugs available, Suction available and Patient being monitored Patient Re-evaluated:Patient Re-evaluated prior to inductionOxygen Delivery Method: Circle system utilized Preoxygenation: Pre-oxygenation with 100% oxygen Intubation Type: IV induction and Rapid sequence Laryngoscope Size: Mac and 3 Grade View: Grade I Tube type: Oral Number of attempts: 1 Placement Confirmation: ETT inserted through vocal cords under direct vision,  positive ETCO2 and breath sounds checked- equal and bilateral Secured at: 21 cm Tube secured with: Tape Dental Injury: Teeth and Oropharynx as per pre-operative assessment

## 2016-03-11 NOTE — Op Note (Signed)
Preoperative diagnoses: rightbreast mass with core biopsy c/w papilloma Postoperative diagnosis: Same as above Procedure:Rightbreast seed guided excisional biopsy Surgeon: Dr. Serita Grammes Anesthesia: Gen. Estimated blood loss: Minimal Complications: None Drains: None Specimens:Rightbreast tissue marked with paint Sponge and needle count correct at completion Disposition to recovery stable  Indications: This is a 42yof who has a rightsided mammographic abnormality. We discussed seed guided excision.She had seed placed prior to surgery. I had her mm in the OR.   Procedure: After informed consent was obtained she was then taken to the operating room. She was given cefazolin. Sequential compression devices were on her legs. She was placed under general anesthesia without complication. Her rightbreast was then prepped and draped in the standard sterile surgical fashion. A surgical timeout was then performed.  I located the radioactive seed with the neoprobe.I infiltrated marcaine in the area of the seed. I made a  periareolar incision.I then used the neoprobe to guide the excision of the seed and surrounding tissue. This was confirmed by the neoprobe. This was painted. This was then taken for mammogram which confirmed removal of the seed and the clip. This was confirmed by radiology. This was then sent to pathology. Hemostasis was observed.I closed the breast tissue with a 2-0 Vicryl. The dermis was closed with 3-0 Vicryl and the skin with 5-0 Monocryl.Dermabond and steristrips were placed on the incision. She was transferred to recovery stable

## 2016-03-12 ENCOUNTER — Encounter (HOSPITAL_BASED_OUTPATIENT_CLINIC_OR_DEPARTMENT_OTHER): Payer: Self-pay | Admitting: General Surgery

## 2016-04-01 ENCOUNTER — Telehealth: Payer: Self-pay | Admitting: *Deleted

## 2016-04-01 NOTE — Telephone Encounter (Signed)
Patient recently had breast biopsy, she is using Ibuprofen to help with discomfort and swelling.  She is concerned about experiencing Gerd symptoms.  Per Dr. Melford Aase, patient was advised to take pills after eating to help alleviate those symptoms. Patient expressed understanding.

## 2016-04-27 ENCOUNTER — Encounter: Payer: Self-pay | Admitting: *Deleted

## 2016-05-28 ENCOUNTER — Ambulatory Visit (INDEPENDENT_AMBULATORY_CARE_PROVIDER_SITE_OTHER): Payer: Commercial Managed Care - HMO | Admitting: Internal Medicine

## 2016-05-28 ENCOUNTER — Encounter: Payer: Self-pay | Admitting: Internal Medicine

## 2016-05-28 VITALS — BP 108/60 | HR 66 | Temp 98.2°F | Resp 16 | Ht 64.5 in | Wt 136.0 lb

## 2016-05-28 DIAGNOSIS — R51 Headache: Secondary | ICD-10-CM

## 2016-05-28 DIAGNOSIS — R519 Headache, unspecified: Secondary | ICD-10-CM

## 2016-05-28 MED ORDER — AZITHROMYCIN 250 MG PO TABS: ORAL_TABLET | ORAL | 0 refills | Status: DC

## 2016-05-28 MED ORDER — DEXAMETHASONE 0.75 MG PO TABS: ORAL_TABLET | ORAL | 0 refills | Status: DC

## 2016-05-28 NOTE — Progress Notes (Signed)
   Subjective:    Patient ID: [name], female    DOB: 08-02-1950, 65 y.o.   MRN: [id number]  HPI  Patient presents to the office for 2 months of frontal headaches.  She reports that they happen just for 3-5 minutes and then will go away.  She reports that she has not really been taking anything for them.  She reports that she did take some tylenol.  The pain is over her eyes and is a dull throbbing pain.  She reports that sometimes it is occasionally a stab.  She reports nothing helps with it.  She has not identified any triggers that she is aware of.  She reports that she does feel that these headaches are different to headaches in the past.    Review of Systems  HENT: Positive for congestion, ear pain, rhinorrhea and sinus pain. Negative for postnasal drip, sore throat and trouble swallowing.   Eyes: Positive for visual disturbance. Negative for photophobia, pain, redness and itching.  Respiratory: Negative for chest tightness and shortness of breath.   Neurological: Positive for dizziness and headaches. Negative for speech difficulty, weakness and light-headedness.       Objective:   Physical Exam  Constitutional: She is oriented to person, place, and time. She appears well-developed and well-nourished. No distress.  HENT:  Head: Normocephalic.  Nose: Mucosal edema present. Right sinus exhibits frontal sinus tenderness. Left sinus exhibits frontal sinus tenderness.  Mouth/Throat: Oropharynx is clear and moist. No oropharyngeal exudate.  Eyes: Conjunctivae are normal. No scleral icterus.  Neck: Normal range of motion. Neck supple. No JVD present. No thyromegaly present.  Cardiovascular: Normal rate, regular rhythm, normal heart sounds and intact distal pulses.  Exam reveals no gallop and no friction rub.   No murmur heard. Pulmonary/Chest: Effort normal and breath sounds normal. No respiratory distress. She has no wheezes. She has no rales. She exhibits no tenderness.   Musculoskeletal: Normal range of motion.  Lymphadenopathy:    She has no cervical adenopathy.  Neurological: She is alert and oriented to person, place, and time. No cranial nerve deficit. Coordination normal.  Skin: Skin is warm and dry. She is not diaphoretic.  Psychiatric: She has a normal mood and affect. Her behavior is normal. Judgment and thought content normal.  Nursing note and vitals reviewed.   Vitals:   05/28/16 0923  BP: 108/60  Pulse: 66  Resp: 16  Temp: 98.2 F (36.8 C)          Assessment & Plan:    1. Sinus headache -decadron -flonase -zpak -restart zyrtec claritin or allegra -tylenol or ibuprofen prn -if no relief she is to let us know

## 2016-05-28 NOTE — Patient Instructions (Signed)
Please start taking claritin, zyrtec, or allegra once daily.    Please start taking flonase two sprays per nostril right before bedtime.    Please take decadron as prescribed until it is gone.  Please take zpak until is gone.

## 2016-07-01 ENCOUNTER — Ambulatory Visit (INDEPENDENT_AMBULATORY_CARE_PROVIDER_SITE_OTHER): Payer: Commercial Managed Care - HMO | Admitting: Internal Medicine

## 2016-07-01 VITALS — BP 102/60 | HR 74 | Temp 98.6°F | Resp 16 | Ht 64.5 in | Wt 138.0 lb

## 2016-07-01 DIAGNOSIS — R51 Headache: Secondary | ICD-10-CM

## 2016-07-01 DIAGNOSIS — R030 Elevated blood-pressure reading, without diagnosis of hypertension: Secondary | ICD-10-CM

## 2016-07-01 DIAGNOSIS — R7303 Prediabetes: Secondary | ICD-10-CM

## 2016-07-01 DIAGNOSIS — R300 Dysuria: Secondary | ICD-10-CM

## 2016-07-01 DIAGNOSIS — E782 Mixed hyperlipidemia: Secondary | ICD-10-CM

## 2016-07-01 DIAGNOSIS — R519 Headache, unspecified: Secondary | ICD-10-CM

## 2016-07-01 DIAGNOSIS — Z79899 Other long term (current) drug therapy: Secondary | ICD-10-CM | POA: Diagnosis not present

## 2016-07-01 LAB — CBC WITH DIFFERENTIAL/PLATELET
BASOS ABS: 27 {cells}/uL (ref 0–200)
Basophils Relative: 1 %
EOS PCT: 3 %
Eosinophils Absolute: 81 cells/uL (ref 15–500)
HCT: 38.1 % (ref 35.0–45.0)
Hemoglobin: 12.7 g/dL (ref 11.7–15.5)
LYMPHS PCT: 58 %
Lymphs Abs: 1566 cells/uL (ref 850–3900)
MCH: 28.9 pg (ref 27.0–33.0)
MCHC: 33.3 g/dL (ref 32.0–36.0)
MCV: 86.6 fL (ref 80.0–100.0)
MONOS PCT: 9 %
MPV: 10.4 fL (ref 7.5–12.5)
Monocytes Absolute: 243 cells/uL (ref 200–950)
NEUTROS PCT: 29 %
Neutro Abs: 783 cells/uL — ABNORMAL LOW (ref 1500–7800)
PLATELETS: 176 10*3/uL (ref 140–400)
RBC: 4.4 MIL/uL (ref 3.80–5.10)
RDW: 13.5 % (ref 11.0–15.0)
WBC: 2.7 10*3/uL — ABNORMAL LOW (ref 3.8–10.8)

## 2016-07-01 LAB — LIPID PANEL
CHOLESTEROL: 205 mg/dL — AB (ref <200)
HDL: 91 mg/dL (ref >50)
LDL Cholesterol: 105 mg/dL — ABNORMAL HIGH (ref <100)
Total CHOL/HDL Ratio: 2.3 Ratio (ref <5.0)
Triglycerides: 47 mg/dL (ref <150)
VLDL: 9 mg/dL (ref <30)

## 2016-07-01 LAB — BASIC METABOLIC PANEL WITH GFR
BUN: 12 mg/dL (ref 7–25)
CALCIUM: 9.3 mg/dL (ref 8.6–10.4)
CO2: 26 mmol/L (ref 20–31)
CREATININE: 0.73 mg/dL (ref 0.50–0.99)
Chloride: 104 mmol/L (ref 98–110)
GFR, Est African American: >89 mL/min (ref >=60)
GFR, Est Non African American: 87 mL/min (ref >=60)
Glucose, Bld: 85 mg/dL (ref 65–99)
Potassium: 4.3 mmol/L (ref 3.5–5.3)
SODIUM: 139 mmol/L (ref 135–146)

## 2016-07-01 LAB — HEPATIC FUNCTION PANEL
ALT: 15 U/L (ref 6–29)
AST: 20 U/L (ref 10–35)
Albumin: 3.8 g/dL (ref 3.6–5.1)
Alkaline Phosphatase: 69 U/L (ref 33–130)
BILIRUBIN DIRECT: 0.1 mg/dL (ref <=0.2)
Indirect Bilirubin: 0.4 mg/dL (ref 0.2–1.2)
Total Bilirubin: 0.5 mg/dL (ref 0.2–1.2)
Total Protein: 6.5 g/dL (ref 6.1–8.1)

## 2016-07-01 LAB — TSH: TSH: 0.79 m[IU]/L

## 2016-07-01 MED ORDER — AMITRIPTYLINE HCL 10 MG PO TABS: 10.0000 mg | ORAL_TABLET | Freq: Every day | ORAL | 0 refills | Status: DC

## 2016-07-01 NOTE — Progress Notes (Signed)
Assessment and Plan:  Hypertension:  -Continue medication,  -monitor blood pressure at home.  -Continue DASH diet.   -Reminder to go to the ER if any CP, SOB, nausea, dizziness, severe HA, changes vision/speech, left arm numbness and tingling, and jaw pain.  Cholesterol: -Continue diet and exercise.  -Check cholesterol.   Pre-diabetes: -Continue diet and exercise.  -Check A1C  Vitamin D Def: -check level -continue medications.   [place] -massage surgical site to help with desensitization  Headaches -possible that this is a neuralgia vs. Cluster headache -elavil 10 mg QHS -patient to call if worsening headaches, increased frequency or neurological deficits in which case we will get CT head  Dysuria -UA -urine culture -not consistent, doubt infection  Continue diet and meds as discussed. Further disposition pending results of labs.  HPI 66 y.o. female  presents for 3 month follow up with hypertension, hyperlipidemia, prediabetes and vitamin D.   Her blood pressure has been controlled at home, today their BP is BP: 102/60.   She does not workout. She denies chest pain, shortness of breath, dizziness.   She is on cholesterol medication and denies myalgias. Her cholesterol is at goal. The cholesterol last visit was:   Lab Results  Component Value Date   CHOL 212 (H) 12/26/2015   HDL 87 12/26/2015   LDLCALC 116 12/26/2015   TRIG 47 12/26/2015   CHOLHDL 2.4 12/26/2015     She has been working on diet and exercise for prediabetes, and denies foot ulcerations, hyperglycemia, hypoglycemia , increased appetite, nausea, paresthesia of the feet, polydipsia, polyuria, visual disturbances, vomiting and weight loss. Last A1C in the office was:  Lab Results  Component Value Date   HGBA1C 5.9 (H) 12/26/2015    Patient is on Vitamin D supplement.  Lab Results  Component Value Date   VD25OH 64 12/26/2015     She reports that she is having some trouble with her sinuses. She  reports that she did take a single dose and felt like she got a rash.  She reports the rash slowly resolved after a couple days.  She reports that she is still having some trouble with her sinuses.  She did finish the antibiotic.  She reports that she is still having some headaches.  She gets a sporadic pain over the left eye and pain in the posterior head and then it will resolve.  She reports that it typically is lasting for a few minutes and then it goes away.  She also reports that her neck has been bothering her.    She notes that she has been having some tenderness at the site of her breast biopsy.  She notes that her breast is sensitive.    She has been having intermittent periods of burning with urination.  She has not had frequency urgency or hematuria.  NO vaginal discharge or rash.     Current Medications:  Current Outpatient Prescriptions on File Prior to Visit  Medication Sig Dispense Refill  . alum & mag hydroxide-simeth (MAALOX/MYLANTA) 200-200-20 MG/5ML suspension Take 30 mLs by mouth every 6 (six) hours as needed for indigestion or heartburn.    Marland Kitchen aspirin 81 MG tablet Take 81 mg by mouth daily.    . cetirizine (ZYRTEC) 10 MG tablet Take 10 mg by mouth daily as needed for allergies.    . Cholecalciferol (D-3-5) 5000 UNITS capsule Take 5,000 Units by mouth daily.    . Flaxseed, Linseed, (FLAXSEED OIL PO) Take by mouth daily.    Marland Kitchen  fluticasone (FLONASE) 50 MCG/ACT nasal spray Place 2 sprays into both nostrils daily. 16 g 0  . Lutein 20 MG TABS Take by mouth.    . Multiple Vitamins-Minerals (WOMENS MULTI VITAMIN & MINERAL PO) Take 1 tablet by mouth daily.    . Omega-3 Fatty Acids (FISH OIL PO) Take by mouth daily.    . Probiotic Product (ALIGN) 4 MG CAPS Take by mouth daily.    . RESTASIS 0.05 % ophthalmic emulsion instill 1 drop into both eyes twice a day  0   No current facility-administered medications on file prior to visit.     Medical History:  Past Medical History:   Diagnosis Date  . Arthritis    cervical and lumbar  . Depression   . GERD (gastroesophageal reflux disease)   . Hyperlipidemia   . Prediabetes   . Vitamin D deficiency     Allergies:  Allergies  Allergen Reactions  . Bactrim [Sulfamethoxazole-Trimethoprim] Other (See Comments)    Patient preference to take medication without sulfa     Review of Systems:  Review of Systems  Constitutional: Negative for chills, fever and malaise/fatigue.  HENT: Negative for congestion, ear pain and sore throat.   Eyes: Negative.   Respiratory: Negative for cough, shortness of breath and wheezing.   Cardiovascular: Negative for chest pain, palpitations and leg swelling.  Gastrointestinal: Negative for abdominal pain, blood in stool, constipation, diarrhea, heartburn and melena.  Genitourinary: Negative.   Skin: Negative.   Neurological: Positive for headaches. Negative for dizziness, sensory change and loss of consciousness.  Psychiatric/Behavioral: Negative for depression. The patient is not nervous/anxious and does not have insomnia.     Family history- Review and unchanged  Social history- Review and unchanged  Physical Exam: BP 102/60   Pulse 74   Temp 98.6 F (37 C) (Temporal)   Resp 16   Ht 5' 4.5" (1.638 m)   Wt 138 lb (62.6 kg)   BMI 23.32 kg/m  Wt Readings from Last 3 Encounters:  07/01/16 138 lb (62.6 kg)  05/28/16 136 lb (61.7 kg)  03/11/16 130 lb (59 kg)    General Appearance: Well nourished well developed, in no apparent distress. Eyes: PERRLA, EOMs, conjunctiva no swelling or erythema ENT/Mouth: Ear canals normal without obstruction, swelling, erythma, discharge.  TMs normal bilaterally.  Oropharynx moist, clear, without exudate, or postoropharyngeal swelling. Neck: Supple, thyroid normal,no cervical adenopathy  Respiratory: Respiratory effort normal, Breath sounds clear A&P without rhonchi, wheeze, or rale.  No retractions, no accessory usage. Cardio: RRR with no  MRGs. Brisk peripheral pulses without edema.  Abdomen: Soft, + BS,  Non tender, no guarding, rebound, hernias, masses. Musculoskeletal: Full ROM, 5/5 strength, Normal gait Skin: Warm, dry without rashes, lesions, ecchymosis.  Neuro: Awake and oriented X 3, Cranial nerves intact. Normal muscle tone, no cerebellar symptoms. Psych: Normal affect, Insight and Judgment appropriate.    Starlyn Skeans, PA-C 10:05 AM [facility]

## 2016-07-01 NOTE — Patient Instructions (Signed)
Amitriptyline tablets What is this medicine? AMITRIPTYLINE (a mee TRIP ti leen) is used to treat depression. This medicine may be used for other purposes; ask your health care provider or pharmacist if you have questions. COMMON BRAND NAME(S): Elavil, Vanatrip What should I tell my health care provider before I take this medicine? They need to know if you have any of these conditions: -an alcohol problem -asthma, difficulty breathing -bipolar disorder or schizophrenia -difficulty passing urine, prostate trouble -glaucoma -heart disease or previous heart attack -liver disease -over active thyroid -seizures -thoughts or plans of suicide, a previous suicide attempt, or family history of suicide attempt -an unusual or allergic reaction to amitriptyline, other medicines, foods, dyes, or preservatives -pregnant or trying to get pregnant -breast-feeding How should I use this medicine? Take this medicine by mouth with a drink of water. Follow the directions on the prescription label. You can take the tablets with or without food. Take your medicine at regular intervals. Do not take it more often than directed. Do not stop taking this medicine suddenly except upon the advice of your doctor. Stopping this medicine too quickly may cause serious side effects or your condition may worsen. A special MedGuide will be given to you by the pharmacist with each prescription and refill. Be sure to read this information carefully each time. Talk to your pediatrician regarding the use of this medicine in children. Special care may be needed. Overdosage: If you think you have taken too much of this medicine contact a poison control center or emergency room at once. NOTE: This medicine is only for you. Do not share this medicine with others. What if I miss a dose? If you miss a dose, take it as soon as you can. If it is almost time for your next dose, take only that dose. Do not take double or extra doses. What  may interact with this medicine? Do not take this medicine with any of the following medications: -arsenic trioxide -certain medicines used to regulate abnormal heartbeat or to treat other heart conditions -cisapride -droperidol -halofantrine -linezolid -MAOIs like Carbex, Eldepryl, Marplan, Nardil, and Parnate -methylene blue -other medicines for mental depression -phenothiazines like perphenazine, thioridazine and chlorpromazine -pimozide -probucol -procarbazine -sparfloxacin -St. John's Wort -ziprasidone This medicine may also interact with the following medications: -atropine and related drugs like hyoscyamine, scopolamine, tolterodine and others -barbiturate medicines for inducing sleep or treating seizures, like phenobarbital -cimetidine -disulfiram -ethchlorvynol -thyroid hormones such as levothyroxine This list may not describe all possible interactions. Give your health care provider a list of all the medicines, herbs, non-prescription drugs, or dietary supplements you use. Also tell them if you smoke, drink alcohol, or use illegal drugs. Some items may interact with your medicine. What should I watch for while using this medicine? Tell your doctor if your symptoms do not get better or if they get worse. Visit your doctor or health care professional for regular checks on your progress. Because it may take several weeks to see the full effects of this medicine, it is important to continue your treatment as prescribed by your doctor. Patients and their families should watch out for new or worsening thoughts of suicide or depression. Also watch out for sudden changes in feelings such as feeling anxious, agitated, panicky, irritable, hostile, aggressive, impulsive, severely restless, overly excited and hyperactive, or not being able to sleep. If this happens, especially at the beginning of treatment or after a change in dose, call your health care professional. You may get   drowsy or  dizzy. Do not drive, use machinery, or do anything that needs mental alertness until you know how this medicine affects you. Do not stand or sit up quickly, especially if you are an older patient. This reduces the risk of dizzy or fainting spells. Alcohol may interfere with the effect of this medicine. Avoid alcoholic drinks. Do not treat yourself for coughs, colds, or allergies without asking your doctor or health care professional for advice. Some ingredients can increase possible side effects. Your mouth may get dry. Chewing sugarless gum or sucking hard candy, and drinking plenty of water will help. Contact your doctor if the problem does not go away or is severe. This medicine may cause dry eyes and blurred vision. If you wear contact lenses you may feel some discomfort. Lubricating drops may help. See your eye doctor if the problem does not go away or is severe. This medicine can cause constipation. Try to have a bowel movement at least every 2 to 3 days. If you do not have a bowel movement for 3 days, call your doctor or health care professional. This medicine can make you more sensitive to the sun. Keep out of the sun. If you cannot avoid being in the sun, wear protective clothing and use sunscreen. Do not use sun lamps or tanning beds/booths. What side effects may I notice from receiving this medicine? Side effects that you should report to your doctor or health care professional as soon as possible: -allergic reactions like skin rash, itching or hives, swelling of the face, lips, or tongue -anxious -breathing problems -changes in vision -confusion -elevated mood, decreased need for sleep, racing thoughts, impulsive behavior -eye pain -fast, irregular heartbeat -feeling faint or lightheaded, falls -feeling agitated, angry, or irritable -fever with increased sweating -hallucination, loss of contact with reality -seizures -stiff muscles -suicidal thoughts or other mood  changes -tingling, pain, or numbness in the feet or hands -trouble passing urine or change in the amount of urine -trouble sleeping -unusually weak or tired -vomiting -yellowing of the eyes or skin Side effects that usually do not require medical attention (report to your doctor or health care professional if they continue or are bothersome): -change in sex drive or performance -change in appetite or weight -constipation -dizziness -dry mouth -nausea -tired -tremors -upset stomach This list may not describe all possible side effects. Call your doctor for medical advice about side effects. You may report side effects to FDA at 1-800-FDA-1088. Where should I keep my medicine? Keep out of the reach of children. Store at room temperature between 20 and 25 degrees C (68 and 77 degrees F). Throw away any unused medicine after the expiration date. NOTE: This sheet is a summary. It may not cover all possible information. If you have questions about this medicine, talk to your doctor, pharmacist, or health care provider.  2017 Elsevier/Gold Standard (2015-11-02 12:14:15)

## 2016-07-02 LAB — URINE CULTURE: ORGANISM ID, BACTERIA: NO GROWTH

## 2016-07-02 LAB — URINALYSIS, ROUTINE W REFLEX MICROSCOPIC
Bilirubin Urine: NEGATIVE
Glucose, UA: NEGATIVE
Hgb urine dipstick: NEGATIVE
Ketones, ur: NEGATIVE
LEUKOCYTES UA: NEGATIVE
NITRITE: NEGATIVE
PROTEIN: NEGATIVE
SPECIFIC GRAVITY, URINE: 1.005 (ref 1.001–1.035)
pH: 7 (ref 5.0–8.0)

## 2016-07-02 LAB — HEMOGLOBIN A1C
HEMOGLOBIN A1C: 5.4 % (ref <5.7)
MEAN PLASMA GLUCOSE: 108 mg/dL

## 2016-07-28 ENCOUNTER — Encounter: Payer: Self-pay | Admitting: Internal Medicine

## 2016-07-28 ENCOUNTER — Ambulatory Visit (INDEPENDENT_AMBULATORY_CARE_PROVIDER_SITE_OTHER): Payer: Commercial Managed Care - HMO | Admitting: Internal Medicine

## 2016-07-28 VITALS — BP 110/60 | HR 64 | Temp 98.0°F | Resp 16 | Ht 64.5 in

## 2016-07-28 DIAGNOSIS — R300 Dysuria: Secondary | ICD-10-CM

## 2016-07-28 MED ORDER — ONDANSETRON HCL 4 MG PO TABS: 4.0000 mg | ORAL_TABLET | Freq: Every day | ORAL | 1 refills | Status: DC | PRN

## 2016-07-28 MED ORDER — CIPROFLOXACIN HCL 500 MG PO TABS: 500.0000 mg | ORAL_TABLET | Freq: Two times a day (BID) | ORAL | 0 refills | Status: AC

## 2016-07-28 NOTE — Patient Instructions (Signed)
Urinary Tract Infection, Adult Introduction A urinary tract infection (UTI) is an infection of any part of the urinary tract. The urinary tract includes the:  Kidneys.  Ureters.  Bladder.  Urethra. These organs make, store, and get rid of pee (urine) in the body. Follow these instructions at home:  Take over-the-counter and prescription medicines only as told by your doctor.  If you were prescribed an antibiotic medicine, take it as told by your doctor. Do not stop taking the antibiotic even if you start to feel better.  Avoid the following drinks:  Alcohol.  Caffeine.  Tea.  Carbonated drinks.  Drink enough fluid to keep your pee clear or pale yellow.  Keep all follow-up visits as told by your doctor. This is important.  Make sure to:  Empty your bladder often and completely. Do not to hold pee for long periods of time.  Empty your bladder before and after sex.  Wipe from front to back after a bowel movement if you are female. Use each tissue one time when you wipe. Contact a doctor if:  You have back pain.  You have a fever.  You feel sick to your stomach (nauseous).  You throw up (vomit).  Your symptoms do not get better after 3 days.  Your symptoms go away and then come back. Get help right away if:  You have very bad back pain.  You have very bad lower belly (abdominal) pain.  You are throwing up and cannot keep down any medicines or water. This information is not intended to replace advice given to you by your health care provider. Make sure you discuss any questions you have with your health care provider. Document Released: 11/19/2007 Document Revised: 11/08/2015 Document Reviewed: 04/23/2015  2017 Elsevier  

## 2016-07-28 NOTE — Progress Notes (Signed)
Assessment and Plan:   1. Dysuria -sounds like a possible UTI.  Patient miserable we will go ahead and treat empirically - ciprofloxacin (CIPRO) 500 MG tablet; Take 1 tablet (500 mg total) by mouth 2 (two) times daily.  Dispense: 20 tablet; Refill: 0 - Urinalysis, Routine w reflex microscopic - Urine culture - Urine culture    HPI 65 y.o.female presents for 2 weeks of worsening issues with increased urinary frequency and lower abdominal pressure.  She is not having any type of dysuria.  She reports that she is not having flank pain.  She is having some lower back pressure.  No fevers.  She does have some chills.  She reports that she did do an AZO test at home for UTI.  She reports that she would like to try doing the amitrypline.    Past Medical History:  Diagnosis Date  . Arthritis    cervical and lumbar  . Depression   . GERD (gastroesophageal reflux disease)   . Hyperlipidemia   . Prediabetes   . Vitamin D deficiency      Allergies  Allergen Reactions  . Bactrim [Sulfamethoxazole-Trimethoprim] Other (See Comments)    Patient preference to take medication without sulfa      Current Outpatient Prescriptions on File Prior to Visit  Medication Sig Dispense Refill  . alum & mag hydroxide-simeth (MAALOX/MYLANTA) 200-200-20 MG/5ML suspension Take 30 mLs by mouth every 6 (six) hours as needed for indigestion or heartburn.    Marland Kitchen amitriptyline (ELAVIL) 10 MG tablet Take 1 tablet (10 mg total) by mouth at bedtime. 30 tablet 0  . aspirin 81 MG tablet Take 81 mg by mouth daily.    . cetirizine (ZYRTEC) 10 MG tablet Take 10 mg by mouth daily as needed for allergies.    . Cholecalciferol (D-3-5) 5000 UNITS capsule Take 5,000 Units by mouth daily.    . Flaxseed, Linseed, (FLAXSEED OIL PO) Take by mouth daily.    . fluticasone (FLONASE) 50 MCG/ACT nasal spray Place 2 sprays into both nostrils daily. 16 g 0  . Lutein 20 MG TABS Take by mouth.    . Multiple Vitamins-Minerals (WOMENS MULTI  VITAMIN & MINERAL PO) Take 1 tablet by mouth daily.    . Omega-3 Fatty Acids (FISH OIL PO) Take by mouth daily.    . Probiotic Product (ALIGN) 4 MG CAPS Take by mouth daily.    . RESTASIS 0.05 % ophthalmic emulsion instill 1 drop into both eyes twice a day  0   No current facility-administered medications on file prior to visit.     ROS: all negative except above.   Physical Exam: There were no vitals filed for this visit. BP 110/60   Pulse 64   Temp 98 F (36.7 C) (Temporal)   Resp 16   Ht 5' 4.5" (1.638 m)  General Appearance: Well developed well nourished, non-toxic appearing in no apparent distress. Eyes: PERRLA, EOMs, conjunctiva w/ no swelling or erythema or discharge Sinuses: No Frontal/maxillary tenderness ENT/Mouth: Ear canals clear without swelling or erythema.  TM's normal bilaterally with no retractions, bulging, or loss of landmarks.   Neck: Supple, thyroid normal, no notable JVD  Respiratory: Respiratory effort normal, Clear breath sounds anteriorly and posteriorly bilaterally without rales, rhonchi, wheezing or stridor. No retractions or accessory muscle usage. Cardio: RRR with no MRGs.   Abdomen: Soft, + BS.  Non tender, no guarding, rebound, hernias, masses. No CVA tenderness bilaterally Musculoskeletal: Full ROM, 5/5 strength, normal gait.  Skin: Warm,  dry without rashes  Neuro: Awake and oriented X 3, Cranial nerves intact. Normal muscle tone, no cerebellar symptoms. Sensation intact.  Psych: normal affect, Insight and Judgment appropriate.     Starlyn Skeans, PA-C 4:30 PM [facility]

## 2016-07-29 LAB — URINALYSIS, ROUTINE W REFLEX MICROSCOPIC
BILIRUBIN URINE: NEGATIVE
GLUCOSE, UA: NEGATIVE
Hgb urine dipstick: NEGATIVE
Ketones, ur: NEGATIVE
LEUKOCYTES UA: NEGATIVE
Nitrite: NEGATIVE
PH: 7 (ref 5.0–8.0)
PROTEIN: NEGATIVE
SPECIFIC GRAVITY, URINE: 1.009 (ref 1.001–1.035)

## 2016-07-29 LAB — URINE CULTURE

## 2016-09-09 ENCOUNTER — Other Ambulatory Visit: Payer: Self-pay | Admitting: Gastroenterology

## 2016-09-09 DIAGNOSIS — R1084 Generalized abdominal pain: Secondary | ICD-10-CM

## 2016-09-09 DIAGNOSIS — R110 Nausea: Secondary | ICD-10-CM

## 2016-09-15 ENCOUNTER — Ambulatory Visit (INDEPENDENT_AMBULATORY_CARE_PROVIDER_SITE_OTHER): Payer: Commercial Managed Care - HMO | Admitting: Internal Medicine

## 2016-09-15 ENCOUNTER — Encounter: Payer: Self-pay | Admitting: Internal Medicine

## 2016-09-15 VITALS — BP 108/64 | HR 80 | Temp 98.2°F | Resp 16 | Ht 64.5 in | Wt 140.0 lb

## 2016-09-15 DIAGNOSIS — R1012 Left upper quadrant pain: Secondary | ICD-10-CM | POA: Diagnosis not present

## 2016-09-15 NOTE — Patient Instructions (Signed)
Constipation, Adult °Constipation is when a person: °· Poops (has a bowel movement) fewer times in a week than normal. °· Has a hard time pooping. °· Has poop that is dry, hard, or bigger than normal. ° °Follow these instructions at home: °Eating and drinking ° °· Eat foods that have a lot of fiber, such as: °? Fresh fruits and vegetables. °? Whole grains. °? Beans. °· Eat less of foods that are high in fat, low in fiber, or overly processed, such as: °? French fries. °? Hamburgers. °? Cookies. °? Candy. °? Soda. °· Drink enough fluid to keep your pee (urine) clear or pale yellow. °General instructions °· Exercise regularly or as told by your doctor. °· Go to the restroom when you feel like you need to poop. Do not hold it in. °· Take over-the-counter and prescription medicines only as told by your doctor. These include any fiber supplements. °· Do pelvic floor retraining exercises, such as: °? Doing deep breathing while relaxing your lower belly (abdomen). °? Relaxing your pelvic floor while pooping. °· Watch your condition for any changes. °· Keep all follow-up visits as told by your doctor. This is important. °Contact a doctor if: °· You have pain that gets worse. °· You have a fever. °· You have not pooped for 4 days. °· You throw up (vomit). °· You are not hungry. °· You lose weight. °· You are bleeding from the anus. °· You have thin, pencil-like poop (stool). °Get help right away if: °· You have a fever, and your symptoms suddenly get worse. °· You leak poop or have blood in your poop. °· Your belly feels hard or bigger than normal (is bloated). °· You have very bad belly pain. °· You feel dizzy or you faint. °This information is not intended to replace advice given to you by your health care provider. Make sure you discuss any questions you have with your health care provider. °Document Released: 11/19/2007 Document Revised: 12/21/2015 Document Reviewed: 11/21/2015 °Elsevier Interactive Patient Education ©  2017 [employer] ° °

## 2016-09-15 NOTE — Progress Notes (Signed)
Assessment and Plan:   1. Left upper quadrant pain -likely constipation and will try amitiza 24 mcg once daily with breakfast.   - Food Allergy Profile - Celiac panel 10    HPI 66 y.o.female presents for LLQ abdominal pain which has been intermittently for the last couple weeks.  She reports that the pain is 7/10 pain which sometimes keeps her awake.  She reports that it is worsened by eating.  She reports that she has relief with some ibuprofen.  She reports that this was helpful.  She has some mild associated with nausea.  Her normal bowel pattern is daily.  She reports that she has not had a BM in 2 days.   She has some difficulty passing her stools.  She reports that they are very solid.  She has increased her fiber intake.  She reports that she has more pain with wheat and grain products.  She reports that she just feels unsettled and has pain.    Past Medical History:  Diagnosis Date  . Arthritis    cervical and lumbar  . Depression   . GERD (gastroesophageal reflux disease)   . Hyperlipidemia   . Prediabetes   . Vitamin D deficiency      Allergies  Allergen Reactions  . Bactrim [Sulfamethoxazole-Trimethoprim] Other (See Comments)    Patient preference to take medication without sulfa      Current Outpatient Prescriptions on File Prior to Visit  Medication Sig Dispense Refill  . alum & mag hydroxide-simeth (MAALOX/MYLANTA) 200-200-20 MG/5ML suspension Take 30 mLs by mouth every 6 (six) hours as needed for indigestion or heartburn.    Marland Kitchen aspirin 81 MG tablet Take 81 mg by mouth daily.    . cetirizine (ZYRTEC) 10 MG tablet Take 10 mg by mouth daily as needed for allergies.    . Cholecalciferol (D-3-5) 5000 UNITS capsule Take 5,000 Units by mouth daily.    . Flaxseed, Linseed, (FLAXSEED OIL PO) Take by mouth daily.    . fluticasone (FLONASE) 50 MCG/ACT nasal spray Place 2 sprays into both nostrils daily. 16 g 0  . Lutein 20 MG TABS Take by mouth.    . Multiple  Vitamins-Minerals (WOMENS MULTI VITAMIN & MINERAL PO) Take 1 tablet by mouth daily.    . Omega-3 Fatty Acids (FISH OIL PO) Take by mouth daily.    . ondansetron (ZOFRAN) 4 MG tablet Take 1 tablet (4 mg total) by mouth daily as needed for nausea or vomiting. 30 tablet 1  . Probiotic Product (ALIGN) 4 MG CAPS Take by mouth daily.    . RESTASIS 0.05 % ophthalmic emulsion instill 1 drop into both eyes twice a day  0   No current facility-administered medications on file prior to visit.     Review of Systems  Constitutional: Positive for chills. Negative for fever and malaise/fatigue.  Respiratory: Negative for cough, shortness of breath and wheezing.   Cardiovascular: Negative for chest pain, palpitations and leg swelling.  Gastrointestinal: Positive for constipation and nausea. Negative for abdominal pain, blood in stool, diarrhea, heartburn, melena and vomiting.  Genitourinary: Positive for frequency. Negative for dysuria, flank pain, hematuria and urgency.  Neurological: Positive for headaches. Negative for dizziness, sensory change and loss of consciousness.     Physical Exam: Filed Weights   09/15/16 1505  Weight: 140 lb (63.5 kg)   BP 108/64   Pulse 80   Temp 98.2 F (36.8 C) (Temporal)   Resp 16   Ht 5' 4.5" (1.638  m)   Wt 140 lb (63.5 kg)   BMI 23.66 kg/m  General Appearance: Well developed well nourished, non-toxic appearing in no apparent distress. Eyes: PERRLA, EOMs, conjunctiva w/ no swelling or erythema or discharge Sinuses: No Frontal/maxillary tenderness ENT/Mouth: Ear canals clear without swelling or erythema.  TM's normal bilaterally with no retractions, bulging, or loss of landmarks.   Neck: Supple, thyroid normal, no notable JVD  Respiratory: Respiratory effort normal, Clear breath sounds anteriorly and posteriorly bilaterally without rales, rhonchi, wheezing or stridor. No retractions or accessory muscle usage. Cardio: RRR with no MRGs.   Abdomen: Soft, + BS.   Non tender, no guarding, rebound, hernias, masses.  Musculoskeletal: Full ROM, 5/5 strength, normal gait.  Skin: Warm, dry without rashes  Neuro: Awake and oriented X 3, Cranial nerves intact. Normal muscle tone, no cerebellar symptoms. Sensation intact.  Psych: normal affect, Insight and Judgment appropriate.     Starlyn Skeans, PA-C 3:12 PM [facility]

## 2016-09-16 ENCOUNTER — Ambulatory Visit: Payer: Self-pay | Admitting: Internal Medicine

## 2016-09-16 LAB — IGA: IGA: 248 mg/dL (ref 81–463)

## 2016-09-16 LAB — FOOD ALLERGY PROFILE
ALMONDS: 0.3 kU/L — AB
Allergen, Salmon, f41: <0.1 kU/L
Cashew IgE: <0.1 kU/L
Egg White IgE: 1 kU/L — ABNORMAL HIGH
Hazelnut: 0.11 kU/L — ABNORMAL HIGH
MILK IGE: 0.25 kU/L — AB
Peanut IgE: <0.1 kU/L
Scallop IgE: 0.14 kU/L — ABNORMAL HIGH
Sesame Seed f10: 0.22 kU/L — ABNORMAL HIGH
Shrimp IgE: <0.1 kU/L
Soybean IgE: 0.2 kU/L — ABNORMAL HIGH
Tuna IgE: <0.1 kU/L
WHEAT IGE: 0.1 kU/L — AB
Walnut: 0.16 kU/L — ABNORMAL HIGH

## 2016-09-16 LAB — ENDOMYSIAL AB IGA RFLX TITER: ENDOMYSIAL SCREEN: NEGATIVE

## 2016-09-17 ENCOUNTER — Encounter: Payer: Self-pay | Admitting: *Deleted

## 2016-09-18 LAB — GLIADIN ANTIBODIES, SERUM
GLIADIN IGA: 7 U (ref <20)
Gliadin IgG: 4 Units (ref <20)

## 2016-09-18 LAB — TISSUE TRANSGLUTAMINASE, IGA: TISSUE TRANSGLUTAMINASE AB, IGA: 1 U/mL (ref <4)

## 2016-09-18 LAB — TISSUE TRANSGLUTAMINASE, IGG: Tissue Transglut Ab: 1 U/mL (ref <6)

## 2016-09-19 ENCOUNTER — Encounter (HOSPITAL_COMMUNITY): Payer: Self-pay

## 2016-09-19 ENCOUNTER — Ambulatory Visit (HOSPITAL_COMMUNITY)
Admission: RE | Admit: 2016-09-19 | Discharge: 2016-09-19 | Disposition: A | Discharge status: Home / Self Care | Payer: Commercial Managed Care - HMO | Source: Ambulatory Visit | Attending: Gastroenterology | Admitting: Gastroenterology

## 2016-09-19 DIAGNOSIS — R109 Unspecified abdominal pain: Secondary | ICD-10-CM | POA: Insufficient documentation

## 2016-09-19 DIAGNOSIS — R1084 Generalized abdominal pain: Secondary | ICD-10-CM

## 2016-09-19 DIAGNOSIS — R110 Nausea: Secondary | ICD-10-CM | POA: Insufficient documentation

## 2016-09-19 DIAGNOSIS — R932 Abnormal findings on diagnostic imaging of liver and biliary tract: Secondary | ICD-10-CM | POA: Insufficient documentation

## 2016-10-08 ENCOUNTER — Encounter (HOSPITAL_COMMUNITY)
Admission: RE | Admit: 2016-10-08 | Discharge: 2016-10-08 | Disposition: A | Discharge status: Home / Self Care | Payer: Commercial Managed Care - HMO | Source: Ambulatory Visit | Attending: Gastroenterology | Admitting: Gastroenterology

## 2016-10-08 DIAGNOSIS — R1084 Generalized abdominal pain: Secondary | ICD-10-CM | POA: Insufficient documentation

## 2016-10-08 DIAGNOSIS — R110 Nausea: Secondary | ICD-10-CM | POA: Diagnosis present

## 2016-10-08 MED ORDER — TECHNETIUM TC 99M MEBROFENIN IV KIT
5.0000 | PACK | Freq: Once | INTRAVENOUS | Status: AC | PRN
  Administered 2016-10-08: 5 via INTRAVENOUS

## 2016-10-27 IMAGING — US US ABDOMEN COMPLETE
1 series · 14 of 25 positions shown · non-contrast
Comparison: CT 11/04/2013

CLINICAL DATA: Abdominal pain.

EXAM:
ULTRASOUND ABDOMEN COMPLETE

[Series 1: us abdomen complete · 14 of 75 slices shown]
[im 1/75]
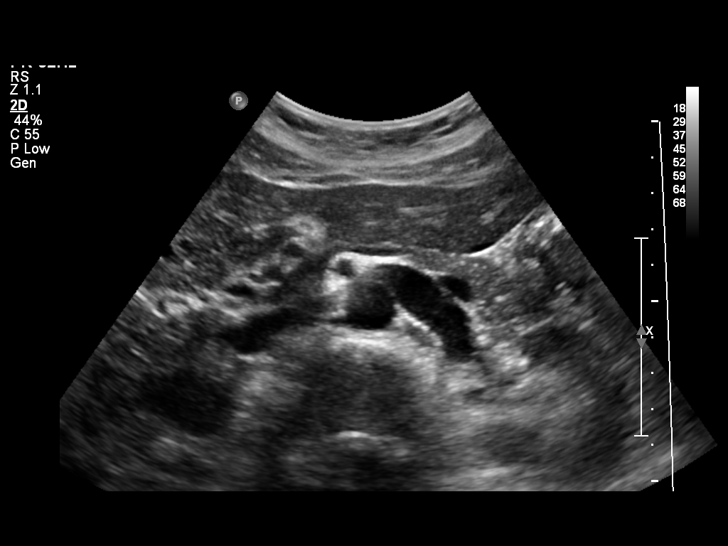
[im 7/75]
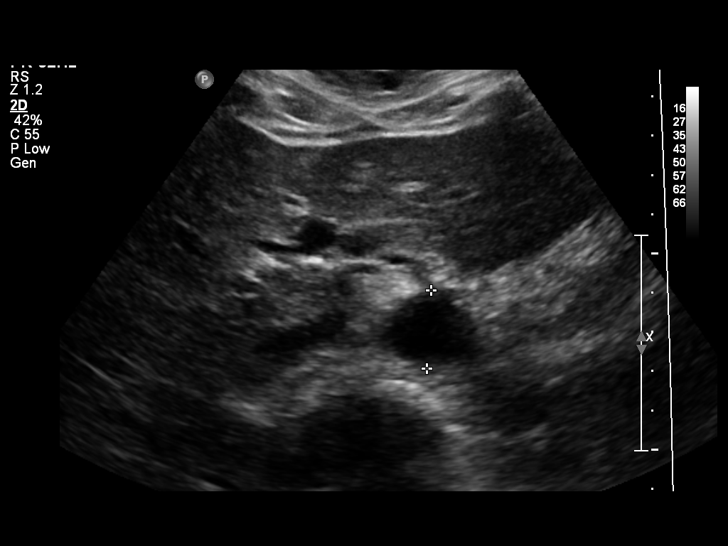
[im 13/75]
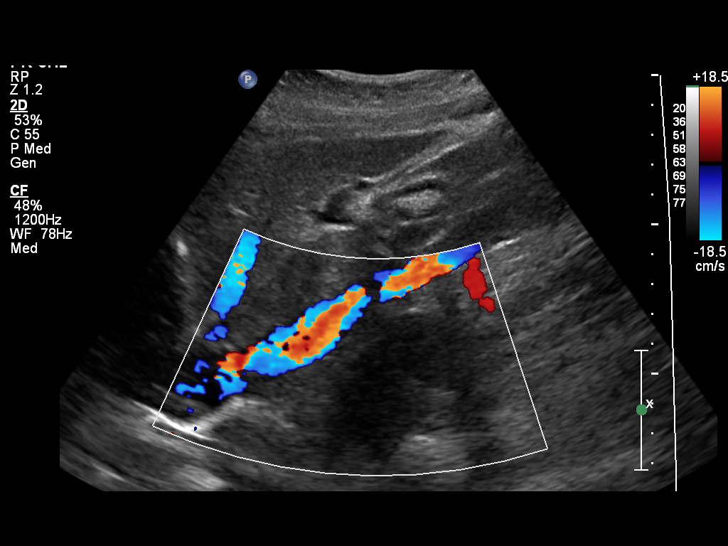
[im 19/75]
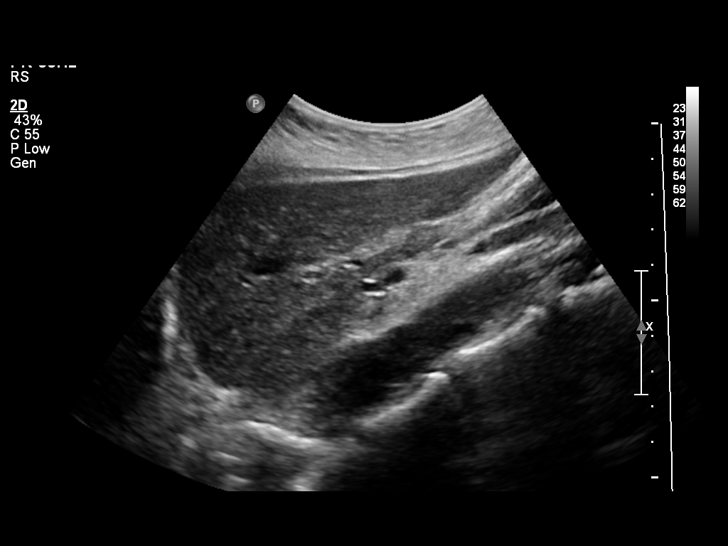
[im 25/75]
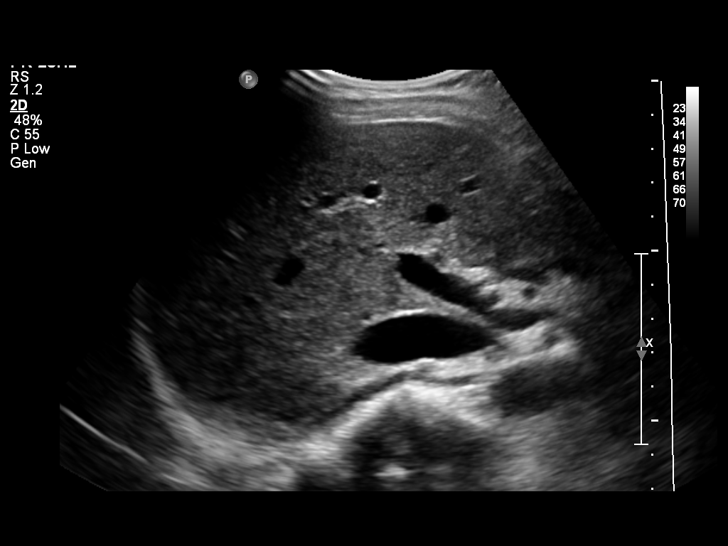
[im 28/75]
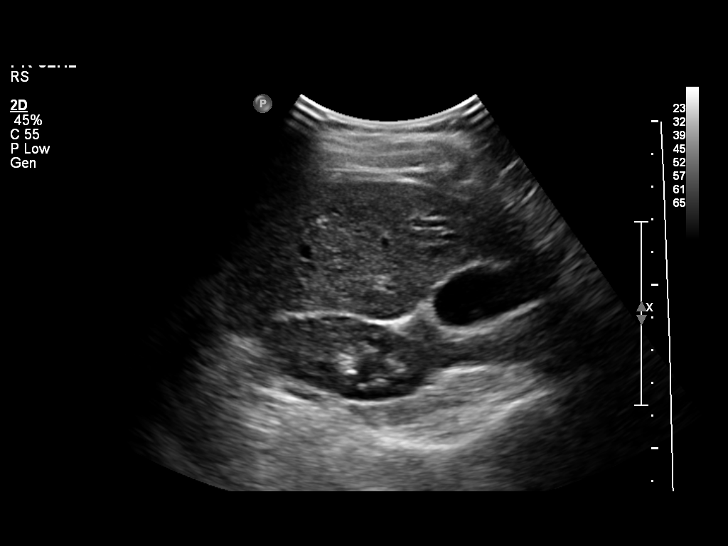
[im 34/75]
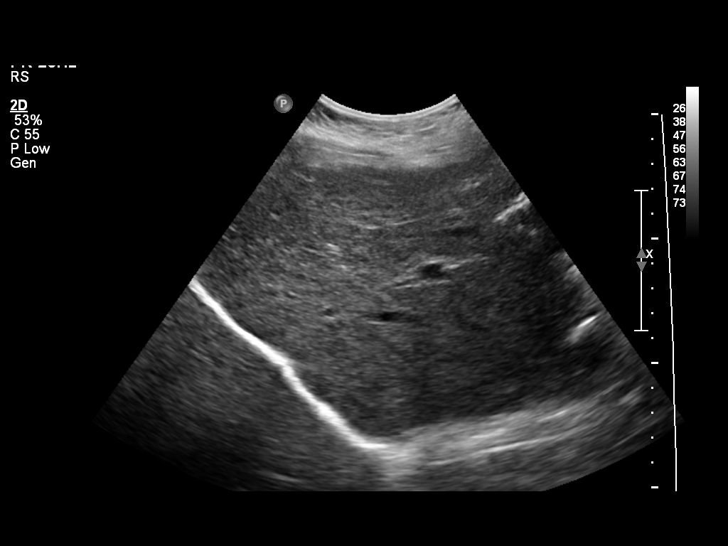
[im 41/75]
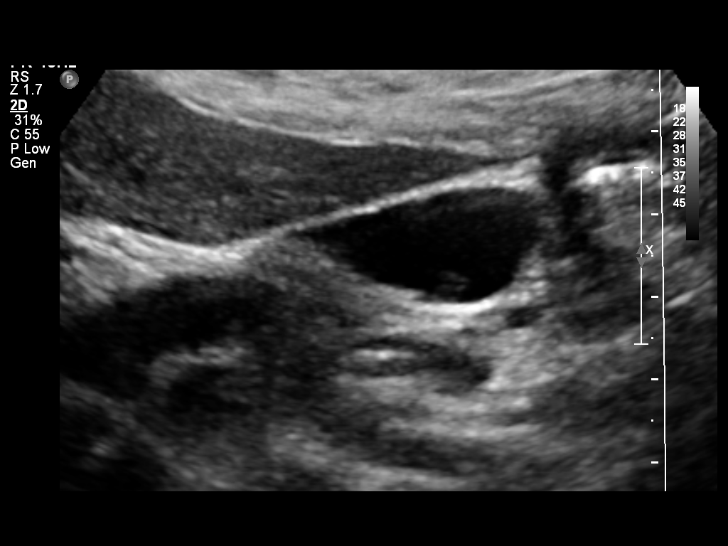
[im 47/75]
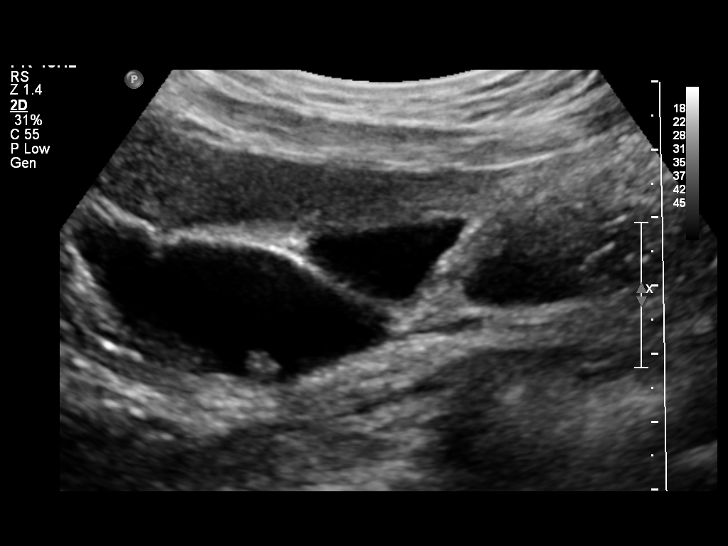
[im 50/75]
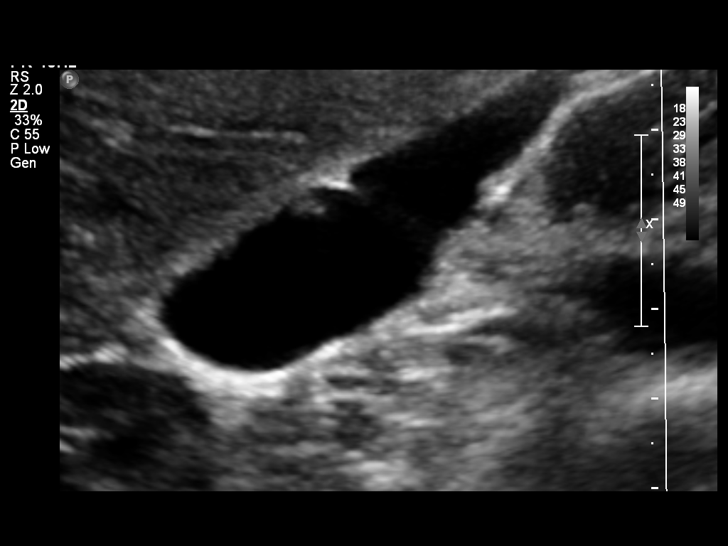
[im 56/75]
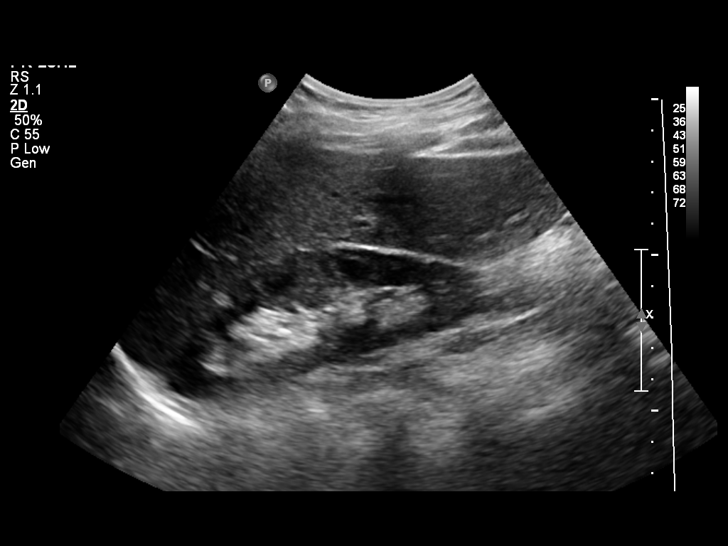
[im 62/75]
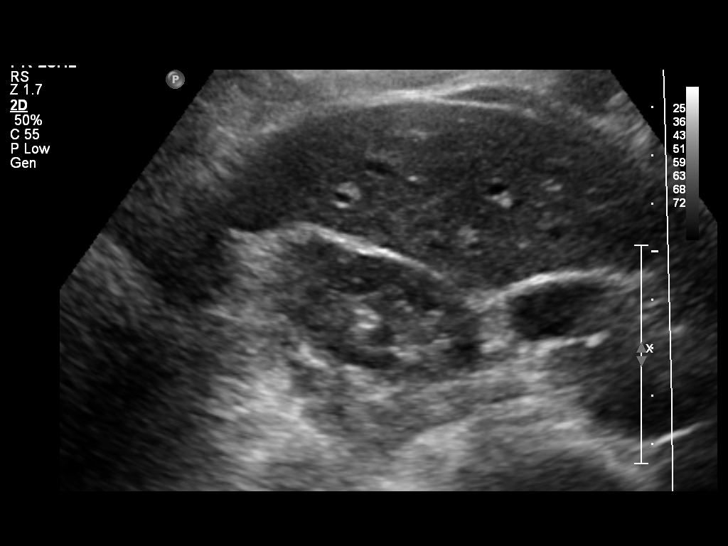
[im 68/75]
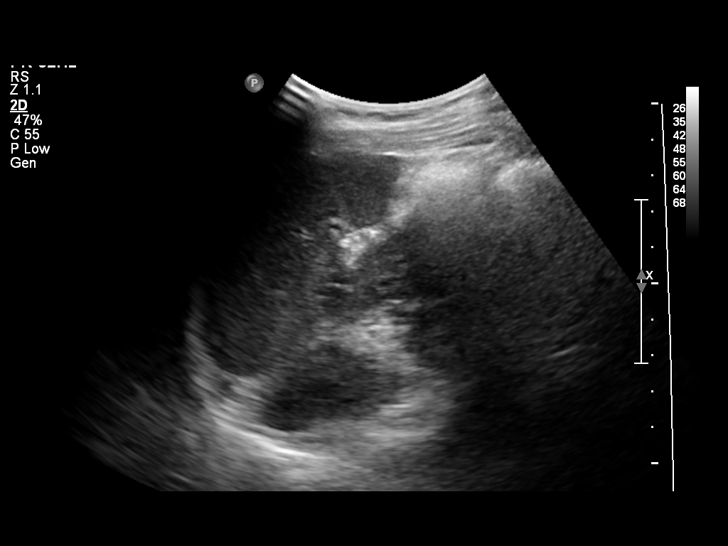
[im 75/75]
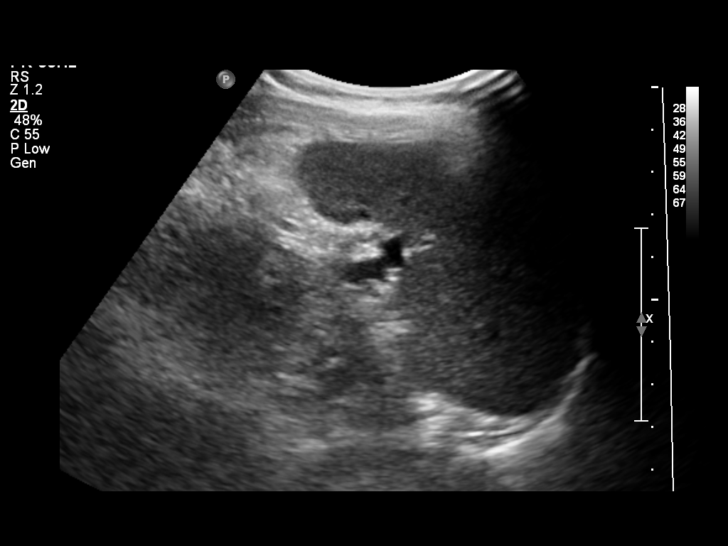

[14 of 25 positions shown; findings below may reference images not displayed]

FINDINGS: Gallbladder: No gallstones noted. Questionable tiny approximately 2
mm polyp anterior gallbladder wall. Focal tiny multiple sludge
noted. Gallbladder wall thickness normal at 2 mm. Negative Murphy
sign. No pericholecystic fluid collection.

Common bile duct: Diameter: 3.1 mm

Liver: No focal lesion identified. Within normal limits in
parenchymal echogenicity.

IVC: No abnormality visualized.

Pancreas: Visualized portion unremarkable.

Spleen: Size and appearance within normal limits.

Right Kidney: Length: 10.6 cm. Echogenicity within normal limits. No
mass or hydronephrosis visualized.

Left Kidney: Length: 10.3 cm. Echogenicity within normal limits. No
mass or hydronephrosis visualized.

Abdominal aorta: No aneurysm visualized.

Other findings: None.
IMPRESSION: Tiny gallbladder polyp. Tiny focus of mobile sludge. Gallbladder
otherwise unremarkable. No biliary distention.

## 2016-11-17 ENCOUNTER — Telehealth: Payer: Self-pay | Admitting: Physician Assistant

## 2016-11-17 ENCOUNTER — Other Ambulatory Visit: Payer: Self-pay | Admitting: Physician Assistant

## 2016-11-17 MED ORDER — PREDNISONE 20 MG PO TABS: ORAL_TABLET | ORAL | 0 refills | Status: DC

## 2016-11-17 MED ORDER — AZITHROMYCIN 250 MG PO TABS: ORAL_TABLET | ORAL | 1 refills | Status: AC

## 2016-11-17 NOTE — Telephone Encounter (Signed)
Informed pt of instructions & Rx that was sent to pharmacy.  Pt agreed & seemed happy with information & hung up.

## 2016-11-17 NOTE — Telephone Encounter (Signed)
Patient calling with 1 week of sinus issues, has been on zyrtec and mucinex without help. Has sore throat, ear pain, sinus drainage, pressure cough without mucus. If not better 7 days needs OV, if worse go to ER  Can get on just prednisone and continue the zyrtec or can start on zpak too.  Make sure you are on an allergy pill, see below for more details. Please take the prednisone as directed below, this is NOT an antibiotic so you do NOT have to finish it. You can take it for a few days and stop it if you are doing better.   Please take the prednisone to help decrease inflammation and therefore decrease symptoms. Take it it with food to avoid GI upset. It can cause increased energy but on the other hand it can make it hard to sleep at night so please take it AT [place], it takes 8-12 hours to start working so it will NOT affect your sleeping if you take it at night with your food!!  If you are diabetic it will increase your sugars so decrease carbs and monitor your sugars closely.

## 2016-12-15 ENCOUNTER — Emergency Department (HOSPITAL_COMMUNITY)
Admission: EM | Admit: 2016-12-15 | Discharge: 2016-12-15 | Disposition: A | Discharge status: Home / Self Care | Payer: 59 | Attending: Emergency Medicine | Admitting: Emergency Medicine

## 2016-12-15 ENCOUNTER — Encounter (HOSPITAL_COMMUNITY): Payer: Self-pay | Admitting: Emergency Medicine

## 2016-12-15 ENCOUNTER — Emergency Department (HOSPITAL_COMMUNITY): Payer: 59

## 2016-12-15 DIAGNOSIS — Z7952 Long term (current) use of systemic steroids: Secondary | ICD-10-CM | POA: Insufficient documentation

## 2016-12-15 DIAGNOSIS — Z7982 Long term (current) use of aspirin: Secondary | ICD-10-CM | POA: Insufficient documentation

## 2016-12-15 DIAGNOSIS — R5381 Other malaise: Secondary | ICD-10-CM | POA: Diagnosis not present

## 2016-12-15 DIAGNOSIS — R0789 Other chest pain: Secondary | ICD-10-CM | POA: Diagnosis present

## 2016-12-15 HISTORY — DX: Irritable bowel syndrome, unspecified: K58.9

## 2016-12-15 LAB — BASIC METABOLIC PANEL
Anion gap: 7 (ref 5–15)
BUN: 14 mg/dL (ref 6–20)
CO2: 26 mmol/L (ref 22–32)
CREATININE: 0.71 mg/dL (ref 0.44–1.00)
Calcium: 9.4 mg/dL (ref 8.9–10.3)
Chloride: 106 mmol/L (ref 101–111)
GFR calc non Af Amer: >60 mL/min (ref >60)
Glucose, Bld: 96 mg/dL (ref 65–99)
Potassium: 3.8 mmol/L (ref 3.5–5.1)
Sodium: 139 mmol/L (ref 135–145)

## 2016-12-15 LAB — CBC
HCT: 37.5 % (ref 36.0–46.0)
Hemoglobin: 12.9 g/dL (ref 12.0–15.0)
MCH: 28.7 pg (ref 26.0–34.0)
MCHC: 34.4 g/dL (ref 30.0–36.0)
MCV: 83.5 fL (ref 78.0–100.0)
PLATELETS: 151 10*3/uL (ref 150–400)
RBC: 4.49 MIL/uL (ref 3.87–5.11)
RDW: 12.7 % (ref 11.5–15.5)
WBC: 2.7 10*3/uL — ABNORMAL LOW (ref 4.0–10.5)

## 2016-12-15 LAB — POCT I-STAT TROPONIN I: TROPONIN I, POC: 0 ng/mL (ref 0.00–0.08)

## 2016-12-15 NOTE — ED Provider Notes (Signed)
[name] Provider Note   CSN: [id number] Arrival date & time: 12/15/16  0806     History   Chief Complaint Chief Complaint  Patient presents with  . chest pain    HPI [name] is a 66 y.o. female.  She presents for evaluation of a fluttering sensation in her chest, which she noticed last night, and it made it somewhat difficult to sleep.  She also felt a pulse irritation in her upper abdomen, while she was trying to sleep.  She did not eat this morning but came here for evaluation.  She has additional sensations of headache, lightheadedness, left neck pain, nausea, and crampy abdominal pain.  She has constipation which improves with drinking warm water, and occasionally taking MiraLAX, and Benefiber.  She denies fever, chills, chest pain, shortness of breath, back pain, weakness or dizziness.  No other recent illnesses or ongoing problems.  No history of cardiac disease.  She has had some stress, according to her husband.  There are no other known modifying factors.  HPI  Past Medical History:  Diagnosis Date  . Arthritis    cervical and lumbar  . Depression   . GERD (gastroesophageal reflux disease)   . Hyperlipidemia   . IBS (irritable bowel syndrome)   . Prediabetes   . Vitamin D deficiency     Patient Active Problem List   Diagnosis Date Noted  . BMI  23.04,  adult 04/14/2015  . Medication management 12/12/2014  . Elevated BP 08/09/2014  . GERD (gastroesophageal reflux disease)   . Prediabetes   . Vitamin D deficiency   . Hyperlipidemia     Past Surgical History:  Procedure Laterality Date  . CARDIAC CATHETERIZATION  2011   normal (Dr. Terrence Dupont)  . RADIOACTIVE SEED GUIDED EXCISIONAL BREAST BIOPSY Right 03/11/2016   Procedure: RIGHT RADIOACTIVE SEED GUIDED EXCISIONAL BREAST BIOPSY;  Surgeon: Rolm Bookbinder, MD;  Location: [facility];  Service: General;  Laterality: Right;    OB History    No data available       Home  Medications    Prior to Admission medications   Medication Sig Start Date End Date Taking? Authorizing Provider  alum & mag hydroxide-simeth (MAALOX/MYLANTA) 200-200-20 MG/5ML suspension Take 30 mLs by mouth every 6 (six) hours as needed for indigestion or heartburn.    [provider]  aspirin 81 MG tablet Take 81 mg by mouth daily.    [provider]  cetirizine (ZYRTEC) 10 MG tablet Take 10 mg by mouth daily as needed for allergies.    [provider]  Cholecalciferol (D-3-5) 5000 UNITS capsule Take 5,000 Units by mouth daily.    [provider]  Flaxseed, Linseed, (FLAXSEED OIL PO) Take by mouth daily.    [provider]  fluticasone (FLONASE) 50 MCG/ACT nasal spray Place 2 sprays into both nostrils daily. 05/18/15   Forcucci, Courtney, PA-C  Lutein 20 MG TABS Take by mouth.    [provider]  Multiple Vitamins-Minerals (WOMENS MULTI VITAMIN & MINERAL PO) Take 1 tablet by mouth daily.    [provider]  Omega-3 Fatty Acids (FISH OIL PO) Take by mouth daily.    [provider]  ondansetron (ZOFRAN) 4 MG tablet Take 1 tablet (4 mg total) by mouth daily as needed for nausea or vomiting. 07/28/16 07/28/17  Forcucci, Loma Sousa, PA-C  predniSONE (DELTASONE) 20 MG tablet 2 tablets daily for 3 days, 1 tablet daily for 4 days. 11/17/16   Silverio Lay,  Estill Bamberg, PA-C  Probiotic Product (ALIGN) 4 MG CAPS Take by mouth daily.    [provider]  RESTASIS 0.05 % ophthalmic emulsion instill 1 drop into both eyes twice a day 11/08/14   [provider]    Family History No family history on file.  Social History Social History  Substance Use Topics  . Smoking status: Never Smoker  . Smokeless tobacco: Never Used  . Alcohol use No     Allergies   Bactrim [sulfamethoxazole-trimethoprim]   Review of Systems Review of Systems  All other systems reviewed and are negative.    Physical Exam Updated Vital Signs BP  111/63   Pulse 64   Temp 97.7 F (36.5 C)   Resp 18   Ht 5' 4.5" (1.638 m)   Wt 62.6 kg (138 lb)   SpO2 100%   BMI 23.32 kg/m   Physical Exam  Constitutional: She is oriented to person, place, and time. She appears well-developed and well-nourished. No distress.  HENT:  Head: Normocephalic and atraumatic.  Eyes: Conjunctivae and EOM are normal. Pupils are equal, round, and reactive to light.  Neck: Normal range of motion and phonation normal. Neck supple.  Cardiovascular: Normal rate and regular rhythm.   Pulmonary/Chest: Effort normal and breath sounds normal. No respiratory distress. She has no wheezes. She exhibits no tenderness.  Abdominal: Soft. She exhibits no distension. There is no tenderness. There is no guarding. No hernia.  No pulsatile mass  Musculoskeletal: Normal range of motion.  Normal ambulation  Neurological: She is alert and oriented to person, place, and time. She exhibits normal muscle tone.  Skin: Skin is warm and dry.  Psychiatric: She has a normal mood and affect. Her behavior is normal. Judgment and thought content normal.  Nursing note and vitals reviewed.    ED Treatments / Results  Labs (all labs ordered are listed, but only abnormal results are displayed) Labs Reviewed  CBC - Abnormal; Notable for the following:       Result Value   WBC 2.7 (*)    All other components within normal limits  BASIC METABOLIC PANEL  I-STAT TROPOININ, ED  POCT I-STAT TROPONIN I    EKG  EKG Interpretation  Date/Time:  Monday December 15 2016 08:14:29 EDT Ventricular Rate:  57 PR Interval:    QRS Duration: 83 QT Interval:  376 QTC Calculation: 366 R Axis:   67 Text Interpretation:  Sinus rhythm Ventricular bigeminy Since last tracing Premature ventricular and fusion complexes More frequent with bigeminy Confirmed by Daleen Bo [phone]) on 12/15/2016 8:21:12 AM Also confirmed by Daleen Bo [phone]), editor Radene Gunning [phone])  on 12/15/2016 8:42:00 AM        Radiology Dg Chest 2 View  Result Date: 12/15/2016 CLINICAL DATA:  Palpitations, mid chest pain with increasing severity since yesterday. History of gastroesophageal reflux. No cardiopulmonary history, nonsmoker. EXAM: CHEST  2 VIEW COMPARISON:  Chest x-ray of November 29, 2015 FINDINGS: The lungs are mildly hyperinflated with hemidiaphragm flattening and increased AP dimension of the thorax. There is no focal infiltrate. The heart and pulmonary vascularity are normal. The mediastinum is normal in width. There is no pleural effusion. The bony thorax exhibits no acute abnormality. IMPRESSION: COPD or reactive airway disease.  No acute pneumonia nor CHF. Electronically Signed   By: David  Martinique M.D.   On: 12/15/2016 08:38    Procedures Procedures (including critical care time)  Medications Ordered in ED Medications - No data to display  Initial Impression / Assessment and Plan / ED Course  I have reviewed the triage vital signs and the nursing notes.  Pertinent labs & imaging results that were available during my care of the patient were reviewed by me and considered in my medical decision making (see chart for details).      Patient Vitals for the past 24 hrs:  BP Temp Pulse Resp SpO2 Height Weight  12/15/16 0815 111/63 97.7 F (36.5 C) 64 18 100 % 5' 4.5" (1.638 m) 62.6 kg (138 lb)    11:44 AM Reevaluation with update and discussion. After initial assessment and treatment, an updated evaluation reveals no change in clinical status.  Findings discussed with patient, and husband, all questions were answered.Daleen Bo L    Final Clinical Impressions(s) / ED Diagnoses   Final diagnoses:  Malaise    Nonspecific symptoms, characterized primarily by "fluttering."  Screening evaluation for acute cardiac pulmonary metabolic and infectious processes all were negative.  Nursing Notes Reviewed/ Care Coordinated Applicable Imaging Reviewed Interpretation of Laboratory Data  incorporated into ED treatment  The patient appears reasonably screened and/or stabilized for discharge and I doubt any other medical condition or other [facility] requiring further screening, evaluation, or treatment in the ED at this time prior to discharge.  Plan: Home Medications-take a gastric acid blocker twice a day for 1 week, and continue usual medications.; Home Treatments-try to get some exercise and drink plenty of fluids; return here if the recommended treatment, does not improve the symptoms; Recommended follow up-PCP as needed if not better in 1 week.   New Prescriptions New Prescriptions   No medications on file     Daleen Bo, MD 12/15/16 1150

## 2016-12-15 NOTE — Discharge Instructions (Signed)
There was no specific cause found for the fluttering sensation, which you are having.  Testing on your heart, lungs and blood, were all reassuring.  Your symptoms may be related to reflux.  Also stress can play into these types of symptoms.  For treatment, take your Zantac twice a day for 1 week.  Also make sure that you are working on stress management, drinking plenty of fluids, and try to get some regular exercise 3 or 4 times a week.  Return here, if needed, for problems.

## 2016-12-15 NOTE — ED Triage Notes (Signed)
patient reports mid chest pain that started last week and was intermittent at first but by the end of the week and yesterday pain became constant. Patient describes as flutter discomfort in mid of chest.  Patient reports nausea since yesterday.  Patient also reports headache intermittently over the past week as well.

## 2016-12-15 NOTE — ED Notes (Signed)
Patient given decaf coffee and water along with graham crackers and peanut butter.

## 2017-01-02 ENCOUNTER — Encounter: Payer: Self-pay | Admitting: Internal Medicine

## 2017-01-13 LAB — HM MAMMOGRAPHY

## 2017-01-15 ENCOUNTER — Encounter: Payer: Self-pay | Admitting: Internal Medicine

## 2017-01-15 ENCOUNTER — Ambulatory Visit (INDEPENDENT_AMBULATORY_CARE_PROVIDER_SITE_OTHER): Payer: 59 | Admitting: Internal Medicine

## 2017-01-15 VITALS — BP 102/58 | HR 57 | Temp 97.8°F | Resp 16 | Ht 64.5 in | Wt 140.8 lb

## 2017-01-15 DIAGNOSIS — R5383 Other fatigue: Secondary | ICD-10-CM

## 2017-01-15 DIAGNOSIS — Z111 Encounter for screening for respiratory tuberculosis: Secondary | ICD-10-CM | POA: Diagnosis not present

## 2017-01-15 DIAGNOSIS — Z79899 Other long term (current) drug therapy: Secondary | ICD-10-CM

## 2017-01-15 DIAGNOSIS — Z1212 Encounter for screening for malignant neoplasm of rectum: Secondary | ICD-10-CM

## 2017-01-15 DIAGNOSIS — E782 Mixed hyperlipidemia: Secondary | ICD-10-CM

## 2017-01-15 DIAGNOSIS — E559 Vitamin D deficiency, unspecified: Secondary | ICD-10-CM

## 2017-01-15 DIAGNOSIS — Z0000 Encounter for general adult medical examination without abnormal findings: Secondary | ICD-10-CM | POA: Diagnosis not present

## 2017-01-15 DIAGNOSIS — Z1211 Encounter for screening for malignant neoplasm of colon: Secondary | ICD-10-CM | POA: Diagnosis not present

## 2017-01-15 DIAGNOSIS — R030 Elevated blood-pressure reading, without diagnosis of hypertension: Secondary | ICD-10-CM

## 2017-01-15 DIAGNOSIS — R7303 Prediabetes: Secondary | ICD-10-CM

## 2017-01-15 DIAGNOSIS — K589 Irritable bowel syndrome without diarrhea: Secondary | ICD-10-CM

## 2017-01-15 DIAGNOSIS — Z136 Encounter for screening for cardiovascular disorders: Secondary | ICD-10-CM

## 2017-01-15 DIAGNOSIS — Z0001 Encounter for general adult medical examination with abnormal findings: Secondary | ICD-10-CM

## 2017-01-15 LAB — BASIC METABOLIC PANEL WITH GFR
BUN: 10 mg/dL (ref 7–25)
CO2: 22 mmol/L (ref 20–31)
Calcium: 9.2 mg/dL (ref 8.6–10.4)
Chloride: 107 mmol/L (ref 98–110)
Creat: 0.77 mg/dL (ref 0.50–0.99)
GFR, EST NON AFRICAN AMERICAN: 82 mL/min (ref >=60)
Glucose, Bld: 80 mg/dL (ref 65–99)
POTASSIUM: 4.1 mmol/L (ref 3.5–5.3)
SODIUM: 139 mmol/L (ref 135–146)

## 2017-01-15 LAB — CBC WITH DIFFERENTIAL/PLATELET
BASOS PCT: 1 %
Basophils Absolute: 25 cells/uL (ref 0–200)
Eosinophils Absolute: 75 cells/uL (ref 15–500)
Eosinophils Relative: 3 %
HCT: 40.1 % (ref 35.0–45.0)
Hemoglobin: 13.3 g/dL (ref 11.7–15.5)
LYMPHS PCT: 57 %
Lymphs Abs: 1425 cells/uL (ref 850–3900)
MCH: 28.7 pg (ref 27.0–33.0)
MCHC: 33.2 g/dL (ref 32.0–36.0)
MCV: 86.4 fL (ref 80.0–100.0)
MONOS PCT: 8 %
MPV: 10.7 fL (ref 7.5–12.5)
Monocytes Absolute: 200 cells/uL (ref 200–950)
Neutro Abs: 775 cells/uL — ABNORMAL LOW (ref 1500–7800)
Neutrophils Relative %: 31 %
Platelets: 178 10*3/uL (ref 140–400)
RBC: 4.64 MIL/uL (ref 3.80–5.10)
RDW: 13.5 % (ref 11.0–15.0)
WBC: 2.5 10*3/uL — AB (ref 3.8–10.8)

## 2017-01-15 LAB — IRON AND TIBC
%SAT: 67 % — ABNORMAL HIGH (ref 11–50)
Iron: 177 ug/dL — ABNORMAL HIGH (ref 45–160)
TIBC: 265 ug/dL (ref 250–450)
UIBC: 88 ug/dL

## 2017-01-15 LAB — HEPATIC FUNCTION PANEL
ALBUMIN: 3.7 g/dL (ref 3.6–5.1)
ALK PHOS: 72 U/L (ref 33–130)
ALT: 15 U/L (ref 6–29)
AST: 21 U/L (ref 10–35)
BILIRUBIN DIRECT: 0.1 mg/dL (ref <=0.2)
BILIRUBIN INDIRECT: 0.5 mg/dL (ref 0.2–1.2)
BILIRUBIN TOTAL: 0.6 mg/dL (ref 0.2–1.2)
Total Protein: 6.4 g/dL (ref 6.1–8.1)

## 2017-01-15 LAB — LIPID PANEL
Cholesterol: 198 mg/dL (ref <200)
HDL: 90 mg/dL (ref >50)
LDL CALC: 98 mg/dL (ref <100)
TRIGLYCERIDES: 51 mg/dL (ref <150)
Total CHOL/HDL Ratio: 2.2 Ratio (ref <5.0)
VLDL: 10 mg/dL (ref <30)

## 2017-01-15 LAB — TSH: TSH: 0.79 mIU/L

## 2017-01-15 MED ORDER — DICYCLOMINE HCL 20 MG PO TABS: ORAL_TABLET | ORAL | 3 refills | Status: DC

## 2017-01-15 NOTE — Patient Instructions (Addendum)
Irritable Bowel Syndrome, Adult Irritable bowel syndrome (IBS) is not one specific disease. It is a group of symptoms that affects the organs responsible for digestion (gastrointestinal or GI tract). To regulate how your GI tract works, your body sends signals back and forth between your intestines and your brain. If you have IBS, there may be a problem with these signals. As a result, your GI tract does not function normally. Your intestines may become more sensitive and overreact to certain things. This is especially true when you eat certain foods or when you are under stress. There are four types of IBS. These may be determined based on the consistency of your stool:  IBS with diarrhea.  IBS with constipation.  Mixed IBS.  Unsubtyped IBS.  It is important to know which type of IBS you have. Some treatments are more likely to be helpful for certain types of IBS. What are the causes? The exact cause of IBS is not known. What increases the risk? You may have a higher risk of IBS if:  You are a woman.  You are younger than 65 years old.  You have a family history of IBS.  You have mental health problems.  You have had bacterial infection of your GI tract.  What are the signs or symptoms? Symptoms of IBS vary from person to person. The main symptom is abdominal pain or discomfort. Additional symptoms usually include one or more of the following:  Diarrhea, constipation, or both.  Abdominal swelling or bloating.  Feeling full or sick after eating a small or regular-size meal.  Frequent gas.  Mucus in the stool.  A feeling of having more stool left after a bowel movement.  Symptoms tend to come and go. They may be associated with stress, psychiatric conditions, or nothing at all. How is this diagnosed? There is no specific test to diagnose IBS. Your health care provider will make a diagnosis based on a physical exam, medical history, and your symptoms. You may have other  tests to rule out other conditions that may be causing your symptoms. These may include:  Blood tests.  X-rays.  CT scan.  Endoscopy and colonoscopy. This is a test in which your GI tract is viewed with a long, thin, flexible tube.  How is this treated? There is no cure for IBS, but treatment can help relieve symptoms. IBS treatment often includes:  Changes to your diet, such as: ? Eating more fiber. ? Avoiding foods that cause symptoms. ? Drinking more water. ? Eating regular, medium-sized portioned meals.  Medicines. These may include: ? Fiber supplements if you have constipation. ? Medicine to control diarrhea (antidiarrheal medicines). ? Medicine to help control muscle spasms in your GI tract (antispasmodic medicines). ? Medicines to help with any mental health issues, such as antidepressants or tranquilizers.  Therapy. ? Talk therapy may help with anxiety, depression, or other mental health issues that can make IBS symptoms worse.  Stress reduction. ? Managing your stress can help keep symptoms under control.  Follow these instructions at home:  Take medicines only as directed by your health care provider.  Eat a healthy diet. ? Avoid foods and drinks with added sugar. ? Include more whole grains, fruits, and vegetables gradually into your diet. This may be especially helpful if you have IBS with constipation. ? Avoid any foods and drinks that make your symptoms worse. These may include dairy products and caffeinated or carbonated drinks. ? Do not eat large meals. ? Drink enough   fluid to keep your urine clear or pale yellow.  Exercise regularly. Ask your health care provider for recommendations of good activities for you.  Keep all follow-up visits as directed by your health care provider. This is important. Contact a health care provider if:  You have constant pain.  You have trouble or pain with swallowing.  You have worsening diarrhea. Get help right away  if:  You have severe and worsening abdominal pain.  You have diarrhea and: ? You have a rash, stiff neck, or severe headache. ? You are irritable, sleepy, or difficult to awaken. ? You are weak, dizzy, or extremely thirsty.  You have bright red blood in your stool or you have black tarry stools.  You have unusual abdominal swelling that is painful.  You vomit continuously.  You vomit blood (hematemesis).  You have both abdominal pain and a fever. This information is not intended to replace advice given to you by your health care provider. Make sure you discuss any questions you have with your health care provider. Document Released: 06/02/2005 Document Revised: 11/02/2015 Document Reviewed: 02/17/2014 Elsevier Interactive Patient Education  2018 [employer]. Diet for Irritable Bowel Syndrome When you have irritable bowel syndrome (IBS), the foods you eat and your eating habits are very important. IBS may cause various symptoms, such as abdominal pain, constipation, or diarrhea. Choosing the right foods can help ease discomfort caused by these symptoms. Work with your health care provider and dietitian to find the best eating plan to help control your symptoms. What general guidelines do I need to follow?  Keep a food diary. This will help you identify foods that cause symptoms. Write down: ? What you eat and when. ? What symptoms you have. ? When symptoms occur in relation to your meals.  Avoid foods that cause symptoms. Talk with your dietitian about other ways to get the same nutrients that are in these foods.  Eat more foods that contain fiber. Take a fiber supplement if directed by your dietitian.  Eat your meals slowly, in a relaxed setting.  Aim to eat 5-6 small meals per day. Do not skip meals.  Drink enough fluids to keep your urine clear or pale yellow.  Ask your health care provider if you should take an over-the-counter probiotic during flare-ups to help restore  healthy gut bacteria.  If you have cramping or diarrhea, try making your meals low in fat and high in carbohydrates. Examples of carbohydrates are pasta, rice, whole grain breads and cereals, fruits, and vegetables.  If dairy products cause your symptoms to flare up, try eating less of them. You might be able to handle yogurt better than other dairy products because it contains bacteria that help with digestion. What foods are not recommended? The following are some foods and drinks that may worsen your symptoms:  Fatty foods, such as [place] fries.  Milk products, such as cheese or ice cream.  Chocolate.  Alcohol.  Products with caffeine, such as coffee.  Carbonated drinks, such as soda.  The items listed above may not be a complete list of foods and beverages to avoid. Contact your dietitian for more information. What foods are good sources of fiber? Your health care provider or dietitian may recommend that you eat more foods that contain fiber. Fiber can help reduce constipation and other IBS symptoms. Add foods with fiber to your diet a little at a time so that your body can get used to them. Too much fiber at once  might cause gas and swelling of your abdomen. The following are some foods that are good sources of fiber:  Apples.  Peaches.  Pears.  Berries.  Figs.  Broccoli (raw).  Cabbage.  Carrots.  Raw peas.  Kidney beans.  Lima beans.  Whole grain bread.  Whole grain cereal.  Where to find more information: [employer] for Functional Gastrointestinal Disorders: www.iffgd.[employer] of Diabetes and Digestive and Kidney Diseases: NetworkAff[employer].co.za.aspx This information is not intended to replace advice given to you by your health care provider. Make sure you discuss any questions you have with your health care provider. Document Released: 08/23/2003 Document Revised:  11/08/2015 Document Reviewed: 09/02/2013 Elsevier Interactive Patient Education  2018 [employer].  ++++++++++++++++++++++++++ Preventive Care for Adults  A healthy lifestyle and preventive care can promote health and wellness. Preventive health guidelines for women include the following key practices.  A routine yearly physical is a good way to check with your health care provider about your health and preventive screening. It is a chance to share any concerns and updates on your health and to receive a thorough exam.  Visit your dentist for a routine exam and preventive care every 6 months. Brush your teeth twice a day and floss once a day. Good oral hygiene prevents tooth decay and gum disease.  The frequency of eye exams is based on your age, health, family medical history, use of contact lenses, and other factors. Follow your health care provider's recommendations for frequency of eye exams.  Eat a healthy diet. Foods like vegetables, fruits, whole grains, low-fat dairy products, and lean protein foods contain the nutrients you need without too many calories. Decrease your intake of foods high in solid fats, added sugars, and salt. Eat the right amount of calories for you.Get information about a proper diet from your health care provider, if necessary.  Regular physical exercise is one of the most important things you can do for your health. Most adults should get at least 150 minutes of moderate-intensity exercise (any activity that increases your heart rate and causes you to sweat) each week. In addition, most adults need muscle-strengthening exercises on 2 or more days a week.  Maintain a healthy weight. The body mass index (BMI) is a screening tool to identify possible weight problems. It provides an estimate of body fat based on height and weight. Your health care provider can find your BMI and can help you achieve or maintain a healthy weight.For adults 20 years and older:  A BMI  below 18.5 is considered underweight.  A BMI of 18.5 to 24.9 is normal.  A BMI of 25 to 29.9 is considered overweight.  A BMI of 30 and above is considered obese.  Maintain normal blood lipids and cholesterol levels by exercising and minimizing your intake of saturated fat. Eat a balanced diet with plenty of fruit and vegetables. Blood tests for lipids and cholesterol should begin at age 49 and be repeated every 5 years. If your lipid or cholesterol levels are high, you are over 50, or you are at high risk for heart disease, you may need your cholesterol levels checked more frequently.Ongoing high lipid and cholesterol levels should be treated with medicines if diet and exercise are not working.  If you smoke, find out from your health care provider how to quit. If you do not use tobacco, do not start.  Lung cancer screening is recommended for adults aged 26-80 years who are at high risk for developing lung  cancer because of a history of smoking. A yearly low-dose CT scan of the lungs is recommended for people who have at least a 30-pack-year history of smoking and are a current smoker or have quit within the past 15 years. A pack year of smoking is smoking an average of 1 pack of cigarettes a day for 1 year (for example: 1 pack a day for 30 years or 2 packs a day for 15 years). Yearly screening should continue until the smoker has stopped smoking for at least 15 years. Yearly screening should be stopped for people who develop a health problem that would prevent them from having lung cancer treatment.  High blood pressure causes heart disease and increases the risk of stroke. Your blood pressure should be checked at least every 1 to 2 years. Ongoing high blood pressure should be treated with medicines if weight loss and exercise do not work.  If you are 64-46 years old, ask your health care provider if you should take aspirin to prevent strokes.  Diabetes screening involves taking a blood sample  to check your fasting blood sugar level. This should be done once every 3 years, after age 25, if you are within normal weight and without risk factors for diabetes. Testing should be considered at a younger age or be carried out more frequently if you are overweight and have at least 1 risk factor for diabetes.  Breast cancer screening is essential preventive care for women. You should practice "breast self-awareness." This means understanding the normal appearance and feel of your breasts and may include breast self-examination. Any changes detected, no matter how small, should be reported to a health care provider. Women in their 79s and 30s should have a clinical breast exam (CBE) by a health care provider as part of a regular health exam every 1 to 3 years. After age 54, women should have a CBE every year. Starting at age 67, women should consider having a mammogram (breast X-ray test) every year. Women who have a family history of breast cancer should talk to their health care provider about genetic screening. Women at a high risk of breast cancer should talk to their health care providers about having an MRI and a mammogram every year.  Breast cancer gene (BRCA)-related cancer risk assessment is recommended for women who have family members with BRCA-related cancers. BRCA-related cancers include breast, ovarian, tubal, and peritoneal cancers. Having family members with these cancers may be associated with an increased risk for harmful changes (mutations) in the breast cancer genes BRCA1 and BRCA2. Results of the assessment will determine the need for genetic counseling and BRCA1 and BRCA2 testing.  Routine pelvic exams to screen for cancer are no longer recommended for nonpregnant women who are considered low risk for cancer of the pelvic organs (ovaries, uterus, and vagina) and who do not have symptoms. Ask your health care provider if a screening pelvic exam is right for you.  If you have had past  treatment for cervical cancer or a condition that could lead to cancer, you need Pap tests and screening for cancer for at least 20 years after your treatment. If Pap tests have been discontinued, your risk factors (such as having a new sexual partner) need to be reassessed to determine if screening should be resumed. Some women have medical problems that increase the chance of getting cervical cancer. In these cases, your health care provider may recommend more frequent screening and Pap tests.  Colorectal cancer can be  detected and often prevented. Most routine colorectal cancer screening begins at the age of 54 years and continues through age 36 years. However, your health care provider may recommend screening at an earlier age if you have risk factors for colon cancer. On a yearly basis, your health care provider may provide home test kits to check for hidden blood in the stool. Use of a small camera at the end of a tube, to directly examine the colon (sigmoidoscopy or colonoscopy), can detect the earliest forms of colorectal cancer. Talk to your health care provider about this at age 70, when routine screening begins. Direct exam of the colon should be repeated every 5-10 years through age 72 years, unless early forms of pre-cancerous polyps or small growths are found.  Hepatitis C blood testing is recommended for all people born from [birth year] through 1965 and any individual with known risks for hepatitis C.  Pra  Osteoporosis is a disease in which the bones lose minerals and strength with aging. This can result in serious bone fractures or breaks. The risk of osteoporosis can be identified using a bone density scan. Women ages 6 years and over and women at risk for fractures or osteoporosis should discuss screening with their health care providers. Ask your health care provider whether you should take a calcium supplement or vitamin D to reduce the rate of osteoporosis.  Menopause can be associated  with physical symptoms and risks. Hormone replacement therapy is available to decrease symptoms and risks. You should talk to your health care provider about whether hormone replacement therapy is right for you.  Use sunscreen. Apply sunscreen liberally and repeatedly throughout the day. You should seek shade when your shadow is shorter than you. Protect yourself by wearing long sleeves, pants, a wide-brimmed hat, and sunglasses year round, whenever you are outdoors.  Once a month, do a whole body skin exam, using a mirror to look at the skin on your back. Tell your health care provider of new moles, moles that have irregular borders, moles that are larger than a pencil eraser, or moles that have changed in shape or color.  Stay current with required vaccines (immunizations).  Influenza vaccine. All adults should be immunized every year.  Tetanus, diphtheria, and acellular pertussis (Td, Tdap) vaccine. Pregnant women should receive 1 dose of Tdap vaccine during each pregnancy. The dose should be obtained regardless of the length of time since the last dose. Immunization is preferred during the 27th-36th week of gestation. An adult who has not previously received Tdap or who does not know her vaccine status should receive 1 dose of Tdap. This initial dose should be followed by tetanus and diphtheria toxoids (Td) booster doses every 10 years. Adults with an unknown or incomplete history of completing a 3-dose immunization series with Td-containing vaccines should begin or complete a primary immunization series including a Tdap dose. Adults should receive a Td booster every 10 years.  Varicella vaccine. An adult without evidence of immunity to varicella should receive 2 doses or a second dose if she has previously received 1 dose. Pregnant females who do not have evidence of immunity should receive the first dose after pregnancy. This first dose should be obtained before leaving the health care facility.  The second dose should be obtained 4-8 weeks after the first dose.  Human papillomavirus (HPV) vaccine. Females aged 13-26 years who have not received the vaccine previously should obtain the 3-dose series. The vaccine is not recommended for use in pregnant  females. However, pregnancy testing is not needed before receiving a dose. If a female is found to be pregnant after receiving a dose, no treatment is needed. In that case, the remaining doses should be delayed until after the pregnancy. Immunization is recommended for any person with an immunocompromised condition through the age of 53 years if she did not get any or all doses earlier. During the 3-dose series, the second dose should be obtained 4-8 weeks after the first dose. The third dose should be obtained 24 weeks after the first dose and 16 weeks after the second dose.  Zoster vaccine. One dose is recommended for adults aged 42 years or older unless certain conditions are present.  Measles, mumps, and rubella (MMR) vaccine. Adults born before [birth year] generally are considered immune to measles and mumps. Adults born in [birth year] or later should have 1 or more doses of MMR vaccine unless there is a contraindication to the vaccine or there is laboratory evidence of immunity to each of the three diseases. A routine second dose of MMR vaccine should be obtained at least 28 days after the first dose for students attending postsecondary schools, health care workers, or international travelers. People who received inactivated measles vaccine or an unknown type of measles vaccine during 1963-1967 should receive 2 doses of MMR vaccine. People who received inactivated mumps vaccine or an unknown type of mumps vaccine before 1979 and are at high risk for mumps infection should consider immunization with 2 doses of MMR vaccine. For females of childbearing age, rubella immunity should be determined. If there is no evidence of immunity, females who are not pregnant should  be vaccinated. If there is no evidence of immunity, females who are pregnant should delay immunization until after pregnancy. Unvaccinated health care workers born before [birth year] who lack laboratory evidence of measles, mumps, or rubella immunity or laboratory confirmation of disease should consider measles and mumps immunization with 2 doses of MMR vaccine or rubella immunization with 1 dose of MMR vaccine.  Pneumococcal 13-valent conjugate (PCV13) vaccine. When indicated, a person who is uncertain of her immunization history and has no record of immunization should receive the PCV13 vaccine. An adult aged 22 years or older who has certain medical conditions and has not been previously immunized should receive 1 dose of PCV13 vaccine. This PCV13 should be followed with a dose of pneumococcal polysaccharide (PPSV23) vaccine. The PPSV23 vaccine dose should be obtained at least 8 weeks after the dose of PCV13 vaccine. An adult aged 53 years or older who has certain medical conditions and previously received 1 or more doses of PPSV23 vaccine should receive 1 dose of PCV13. The PCV13 vaccine dose should be obtained 1 or more years after the last PPSV23 vaccine dose.    Pneumococcal polysaccharide (PPSV23) vaccine. When PCV13 is also indicated, PCV13 should be obtained first. All adults aged 77 years and older should be immunized. An adult younger than age 79 years who has certain medical conditions should be immunized. Any person who resides in a nursing home or long-term care facility should be immunized. An adult smoker should be immunized. People with an immunocompromised condition and certain other conditions should receive both PCV13 and PPSV23 vaccines. People with human immunodeficiency virus (HIV) infection should be immunized as soon as possible after diagnosis. Immunization during chemotherapy or radiation therapy should be avoided. Routine use of PPSV23 vaccine is not recommended for American Indians,  [place]atives, or people younger than 65 years unless there are medical  conditions that require PPSV23 vaccine. When indicated, people who have unknown immunization and have no record of immunization should receive PPSV23 vaccine. One-time revaccination 5 years after the first dose of PPSV23 is recommended for people aged 19-64 years who have chronic kidney failure, nephrotic syndrome, asplenia, or immunocompromised conditions. People who received 1-2 doses of PPSV23 before age 26 years should receive another dose of PPSV23 vaccine at age 75 years or later if at least 5 years have passed since the previous dose. Doses of PPSV23 are not needed for people immunized with PPSV23 at or after age 40 years.  Preventive Services / Frequency   Ages 42 to 27 years  Blood pressure check.  Lipid and cholesterol check.  Lung cancer screening. / Every year if you are aged 59-80 years and have a 30-pack-year history of smoking and currently smoke or have quit within the past 15 years. Yearly screening is stopped once you have quit smoking for at least 15 years or develop a health problem that would prevent you from having lung cancer treatment.  Clinical breast exam.** / Every year after age 46 years.  BRCA-related cancer risk assessment.** / For women who have family members with a BRCA-related cancer (breast, ovarian, tubal, or peritoneal cancers).  Mammogram.** / Every year beginning at age 58 years and continuing for as long as you are in good health. Consult with your health care provider.  Pap test.** / Every 3 years starting at age 26 years through age 26 or 35 years with a history of 3 consecutive normal Pap tests.  HPV screening.** / Every 3 years from ages 79 years through ages 17 to 61 years with a history of 3 consecutive normal Pap tests.  Fecal occult blood test (FOBT) of stool. / Every year beginning at age 72 years and continuing until age 51 years. You may not need to do this test if you  get a colonoscopy every 10 years.  Flexible sigmoidoscopy or colonoscopy.** / Every 5 years for a flexible sigmoidoscopy or every 10 years for a colonoscopy beginning at age 36 years and continuing until age 50 years.  Hepatitis C blood test.** / For all people born from [birth year] through 1964 and any individual with known risks for hepatitis C.  Skin self-exam. / Monthly.  Influenza vaccine. / Every year.  Tetanus, diphtheria, and acellular pertussis (Tdap/Td) vaccine.** / Consult your health care provider. Pregnant women should receive 1 dose of Tdap vaccine during each pregnancy. 1 dose of Td every 10 years.  Varicella vaccine.** / Consult your health care provider. Pregnant females who do not have evidence of immunity should receive the first dose after pregnancy.  Zoster vaccine.** / 1 dose for adults aged 26 years or older.  Pneumococcal 13-valent conjugate (PCV13) vaccine.** / Consult your health care provider.  Pneumococcal polysaccharide (PPSV23) vaccine.** / 1 to 2 doses if you smoke cigarettes or if you have certain conditions.  Meningococcal vaccine.** / Consult your health care provider.  Hepatitis A vaccine.** / Consult your health care provider.  Hepatitis B vaccine.** / Consult your health care provider. Screening for abdominal aortic aneurysm (AAA)  by ultrasound is recommended for people over 50 who have history of high blood pressure or who are current or former smokers. ++++++++++++++++++ Recommend Adult Low Dose Aspirin or  coated  Aspirin 81 mg daily  To reduce risk of Colon Cancer 20 %,  Skin Cancer 26 % ,  Melanoma 46%  and  Pancreatic cancer 60% +++++++++++++++++++ Vitamin  D goal  is between 70-100.  Please make sure that you are taking your Vitamin D as directed.  It is very important as a natural anti-inflammatory  helping hair, skin, and nails, as well as reducing stroke and heart attack risk.  It helps your bones and helps with mood. It also decreases  numerous cancer risks so please take it as directed.  Low Vit D is associated with a 200-300% higher risk for CANCER  and 200-300% higher risk for HEART   ATTACK  &  STROKE.   .....................................Marland Kitchen It is also associated with higher death rate at younger ages,  autoimmune diseases like Rheumatoid arthritis, Lupus, Multiple Sclerosis.    Also many other serious conditions, like depression, Alzheimer's Dementia, infertility, muscle aches, fatigue, fibromyalgia - just to name a few. ++++++++++++++++++ Recommend the book "The END of DIETING" by Dr Excell Seltzer  & the book "The END of DIABETES " by Dr Excell Seltzer At [email or web address] - get book & Audio CD's    Being diabetic has a  300% increased risk for heart attack, stroke, cancer, and alzheimer- type vascular dementia. It is very important that you work harder with diet by avoiding all foods that are white. Avoid white rice (brown & wild rice is OK), white potatoes (sweetpotatoes in moderation is OK), White bread or wheat bread or anything made out of white flour like bagels, donuts, rolls, buns, biscuits, cakes, pastries, cookies, pizza crust, and pasta (made from white flour & egg whites) - vegetarian pasta or spinach or wheat pasta is OK. Multigrain breads like Arnold's or Pepperidge Farm, or multigrain sandwich thins or flatbreads.  Diet, exercise and weight loss can reverse and cure diabetes in the early stages.  Diet, exercise and weight loss is very important in the control and prevention of complications of diabetes which affects every system in your body, ie. Brain - dementia/stroke, eyes - glaucoma/blindness, heart - heart attack/heart failure, kidneys - dialysis, stomach - gastric paralysis, intestines - malabsorption, nerves - severe painful neuritis, circulation - gangrene & loss of a leg(s), and finally cancer and Alzheimers.    I recommend avoid fried & greasy foods,  sweets/candy, white rice (brown or wild rice or Quinoa is  OK), white potatoes (sweet potatoes are OK) - anything made from white flour - bagels, doughnuts, rolls, buns, biscuits,white and wheat breads, pizza crust and traditional pasta made of white flour & egg white(vegetarian pasta or spinach or wheat pasta is OK).  Multi-grain bread is OK - like multi-grain flat bread or sandwich thins. Avoid alcohol in excess. Exercise is also important.    Eat all the vegetables you want - avoid meat, especially red meat and dairy - especially cheese.  Cheese is the most concentrated form of trans-fats which is the worst thing to clog up our arteries. Veggie cheese is OK which can be found in the fresh produce section at [employer] or [employer] or [employer]  ++++++++++++++++++++++ DASH Eating Plan  DASH stands for "Dietary Approaches to Stop Hypertension."   The DASH eating plan is a healthy eating plan that has been shown to reduce high blood pressure (hypertension). Additional health benefits may include reducing the risk of type 2 diabetes mellitus, heart disease, and stroke. The DASH eating plan may also help with weight loss. WHAT DO I NEED TO KNOW ABOUT THE DASH EATING PLAN? For the DASH eating plan, you will follow these general guidelines:  Choose foods with a percent daily value for sodium of less  than 5% (as listed on the food label).  Use salt-free seasonings or herbs instead of table salt or sea salt.  Check with your health care provider or pharmacist before using salt substitutes.  Eat lower-sodium products, often labeled as "lower sodium" or "no salt added."  Eat fresh foods.  Eat more vegetables, fruits, and low-fat dairy products.  Choose whole grains. Look for the word "whole" as the first word in the ingredient list.  Choose fish   Limit sweets, desserts, sugars, and sugary drinks.  Choose heart-healthy fats.  Eat veggie cheese   Eat more home-cooked food and less restaurant, buffet, and fast food.  Limit fried  foods.  Cook foods using methods other than frying.  Limit canned vegetables. If you do use them, rinse them well to decrease the sodium.  When eating at a restaurant, ask that your food be prepared with less salt, or no salt if possible.                      WHAT FOODS CAN I EAT? Read Dr Fara Olden Fuhrman's books on The End of Dieting & The End of Diabetes  Grains Whole grain or whole wheat bread. Brown rice. Whole grain or whole wheat pasta. Quinoa, bulgur, and whole grain cereals. Low-sodium cereals. Corn or whole wheat flour tortillas. Whole grain cornbread. Whole grain crackers. Low-sodium crackers.  Vegetables Fresh or frozen vegetables (raw, steamed, roasted, or grilled). Low-sodium or reduced-sodium tomato and vegetable juices. Low-sodium or reduced-sodium tomato sauce and paste. Low-sodium or reduced-sodium canned vegetables.   Fruits All fresh, canned (in natural juice), or frozen fruits.  Protein Products  All fish and seafood.  Dried beans, peas, or lentils. Unsalted nuts and seeds. Unsalted canned beans.  Dairy Low-fat dairy products, such as skim or 1% milk, 2% or reduced-fat cheeses, low-fat ricotta or cottage cheese, or plain low-fat yogurt. Low-sodium or reduced-sodium cheeses.  Fats and Oils Tub margarines without trans fats. Light or reduced-fat mayonnaise and salad dressings (reduced sodium). Avocado. Safflower, olive, or canola oils. Natural peanut or almond butter.  Other Unsalted popcorn and pretzels. The items listed above may not be a complete list of recommended foods or beverages. Contact your dietitian for more options.  ++++++++++++++++++  WHAT FOODS ARE NOT RECOMMENDED? Grains/ White flour or wheat flour White bread. White pasta. White rice. Refined cornbread. Bagels and croissants. Crackers that contain trans fat.  Vegetables  Creamed or fried vegetables. Vegetables in a . Regular canned vegetables. Regular canned tomato sauce and paste. Regular  tomato and vegetable juices.  Fruits Dried fruits. Canned fruit in light or heavy syrup. Fruit juice.  Meat and Other Protein Products Meat in general - RED meat & White meat.  Fatty cuts of meat. Ribs, chicken wings, all processed meats as bacon, sausage, bologna, salami, fatback, hot dogs, bratwurst and packaged luncheon meats.  Dairy Whole or 2% milk, cream, half-and-half, and cream cheese. Whole-fat or sweetened yogurt. Full-fat cheeses or blue cheese. Non-dairy creamers and whipped toppings. Processed cheese, cheese spreads, or cheese curds.  Condiments Onion and garlic salt, seasoned salt, table salt, and sea salt. Canned and packaged gravies. Worcestershire sauce. Tartar sauce. Barbecue sauce. Teriyaki sauce. Soy sauce, including reduced sodium. Steak sauce. Fish sauce. Oyster sauce. Cocktail sauce. Horseradish. Ketchup and mustard. Meat flavorings and tenderizers. Bouillon cubes. Hot sauce. Tabasco sauce. Marinades. Taco seasonings. Relishes.  Fats and Oils Butter, stick margarine, lard, shortening and bacon fat. Coconut, palm kernel, or palm oils. Regular  salad dressings.  Pickles and olives. Salted popcorn and pretzels.  The items listed above may not be a complete list of foods and beverages to avoid.

## 2017-01-15 NOTE — Progress Notes (Signed)
[facility] Unk Pinto, M.D.      Uvaldo Bristle. Silverio Lay, P.A.-C [facility]                [address] [place], [place] [address] Telephone [phone] Telefax [phone]  Annual Screening/Preventative Visit & Comprehensive Evaluation &  Examination     This very nice 66 y.o. MBF presents for a Screening/Preventative Visit & comprehensive evaluation and management of multiple medical co-morbidities.  Patient has been followed expectantly for labile HTN, Prediabetes, Hyperlipidemia and Vitamin D Deficiency. She also has hx/o IBS & GERD and has also seen Dr Earlean Shawl for same in the past. Today she does relate a 3-4 month hx/o intermittent lower abdominal vague discomfort and bloating.       Patient is followed expectantly for elevated BP .  Patient's BP has been controlled at home and patient denies any cardiac symptoms as chest pain, palpitations, shortness of breath, dizziness or ankle swelling. Today's BP is  low -98/60 and was rechecked at 102/58  / P57.      Patient's hyperlipidemia is controlled with diet and medications.  Last lipids were not at goal: Lab Results  Component Value Date   CHOL 205 (H) 07/01/2016   HDL 91 07/01/2016   LDLCALC 105 (H) 07/01/2016   TRIG 47 07/01/2016   CHOLHDL 2.3 07/01/2016      Patient has prediabetes (A1c 5.7% in 2014, 5.9% in 2015 and 6.1% in 2016) and patient denies reactive hypoglycemic symptoms, visual blurring, diabetic polys, or paresthesias. Last A1c was at goal: Lab Results  Component Value Date   HGBA1C 5.4 07/01/2016      Finally, patient has history of Vitamin D Deficiency and last Vitamin D was at goal: Lab Results  Component Value Date   VD25OH 64 12/26/2015   Current Outpatient Prescriptions on File Prior to Visit  Medication Sig  . alum & mag hydroxide-simeth (MAALOX/MYLANTA) 200-200-20 MG/5ML suspension Take 30 mLs by mouth every 6 (six) hours  as needed for indigestion or heartburn.  Marland Kitchen aspirin 81 MG tablet Take 81 mg by mouth daily.  . cetirizine (ZYRTEC) 10 MG tablet Take 10 mg by mouth daily as needed for allergies.  . Cholecalciferol (D-3-5) 5000 UNITS capsule Take 5,000 Units by mouth daily.  . Flaxseed, Linseed, (FLAXSEED OIL PO) Take by mouth daily.  . fluticasone (FLONASE) 50 MCG/ACT nasal spray Place 2 sprays into both nostrils daily.  . Lutein 20 MG TABS Take by mouth.  . Multiple Vitamins-Minerals (WOMENS MULTI VITAMIN & MINERAL PO) Take 1 tablet by mouth daily.  . Omega-3 Fatty Acids (FISH OIL PO) Take by mouth daily.  . ondansetron (ZOFRAN) 4 MG tablet Take 1 tablet (4 mg total) by mouth daily as needed for nausea or vomiting.  . Probiotic Product (ALIGN) 4 MG CAPS Take by mouth daily.  . RESTASIS 0.05 % ophthalmic emulsion instill 1 drop into both eyes twice a day   No current facility-administered medications on file prior to visit.    Allergies  Allergen Reactions  . Bactrim [Sulfamethoxazole-Trimethoprim] Other (See Comments)    Patient preference to take medication without sulfa   Past Medical History:  Diagnosis Date  . Arthritis    cervical and lumbar  . Depression   . GERD (gastroesophageal reflux disease)   . Hyperlipidemia   . IBS (irritable bowel syndrome)   .  Prediabetes   . Vitamin D deficiency    Health Maintenance  Topic Date Due  . Hepatitis C Screening  07/21/1950  . HIV Screening  04/18/1967  . FOOT EXAM  12/07/2014  . PAP SMEAR  11/19/2015  . URINE MICROALBUMIN  12/25/2016  . HEMOGLOBIN A1C  12/29/2016  . INFLUENZA VACCINE  01/14/2017  . TETANUS/TDAP  05/17/2017  . OPHTHALMOLOGY EXAM  09/16/2017  . MAMMOGRAM  12/31/2017  . COLONOSCOPY  08/16/2025   Immunization History  Administered Date(s) Administered  . PPD Test 12/12/2014, 12/26/2015  . Td 05/18/2007  . Zoster 05/17/2006   Past Surgical History:  Procedure Laterality Date  . CARDIAC CATHETERIZATION  2011   normal (Dr.  Terrence Dupont)  . RADIOACTIVE SEED GUIDED EXCISIONAL BREAST BIOPSY Right 03/11/2016   Procedure: RIGHT RADIOACTIVE SEED GUIDED EXCISIONAL BREAST BIOPSY;  Surgeon: Rolm Bookbinder, MD;  Location: [facility];  Service: General;  Laterality: Right;   No family history on file. Social History  Substance Use Topics  . Smoking status: Never Smoker  . Smokeless tobacco: Never Used  . Alcohol use No    ROS Constitutional: Denies fever, chills, weight loss/gain, headaches, insomnia,  night sweats, and change in appetite. Does c/o fatigue. Eyes: Denies redness, blurred vision, diplopia, discharge, itchy, watery eyes.  ENT: Denies discharge, congestion, post nasal drip, epistaxis, sore throat, earache, hearing loss, dental pain, Tinnitus, Vertigo, Sinus pain, snoring.  Cardio: Denies chest pain, palpitations, irregular heartbeat, syncope, dyspnea, diaphoresis, orthopnea, PND, claudication, edema Respiratory: denies cough, dyspnea, DOE, pleurisy, hoarseness, laryngitis, wheezing.  Gastrointestinal: Denies dysphagia, heartburn, reflux, water brash, pain, cramps, nausea, vomiting, bloating, diarrhea, constipation, hematemesis, melena, hematochezia, jaundice, hemorrhoids Genitourinary: Denies dysuria, frequency, urgency, nocturia, hesitancy, discharge, hematuria, flank pain Breast: Breast lumps, nipple discharge, bleeding.  Musculoskeletal: Denies arthralgia, myalgia, stiffness, Jt. Swelling, pain, limp, and strain/sprain. Denies falls. Skin: Denies puritis, rash, hives, warts, acne, eczema, changing in skin lesion Neuro: No weakness, tremor, incoordination, spasms, paresthesia, pain Psychiatric: Denies confusion, memory loss, sensory loss. Denies Depression. Endocrine: Denies change in weight, skin, hair change, nocturia, and paresthesia, diabetic polys, visual blurring, hyper / hypo glycemic episodes.  Heme/Lymph: No excessive bleeding, bruising, enlarged lymph nodes.  Physical Exam  BP  98/60 - > 102/58      P  52-> 57        T 97.8 F    R 16   Ht 5' 4.5"    Wt 140 lb 12.8 oz    BMI 23.80   General Appearance: Well nourished, well groomed and in no apparent distress.  Eyes: PERRLA, EOMs, conjunctiva no swelling or erythema, normal fundi and vessels. Sinuses: No frontal/maxillary tenderness ENT/Mouth: EACs patent / TMs  nl. Nares clear without erythema, swelling, mucoid exudates. Oral hygiene is good. No erythema, swelling, or exudate. Tongue normal, non-obstructing. Tonsils not swollen or erythematous. Hearing normal.  Neck: Supple, thyroid normal. No bruits, nodes or JVD. Respiratory: Respiratory effort normal.  BS equal and clear bilateral without rales, rhonci, wheezing or stridor. Cardio: Heart sounds are normal with regular rate and rhythm and no murmurs, rubs or gallops. Peripheral pulses are normal and equal bilaterally without edema. No aortic or femoral bruits. Chest: symmetric with normal excursions and percussion. Breasts: Symmetric, without lumps, nipple discharge, retractions, or fibrocystic changes.  Abdomen: Flat, soft with bowel sounds active. Nontender, no guarding, rebound, hernias, masses, or organomegaly.  Lymphatics: Non tender without lymphadenopathy.  Musculoskeletal: Full ROM all peripheral extremities, joint stability, 5/5 strength, and normal gait.  Skin: Warm and dry without rashes, lesions, cyanosis, clubbing or  ecchymosis.  Neuro: Cranial nerves intact, reflexes equal bilaterally. Normal muscle tone, no cerebellar symptoms. Sensation intact.  Pysch: Alert and oriented X 3, normal affect, Insight and Judgment appropriate.   Assessment and Plan  1. Annual Preventative Screening Examination   2. Elevated blood pressure reading  - EKG 12-Lead - [place], RETROPERITNL ABD,  LTD - Urinalysis, Routine w reflex microscopic - Microalbumin / creatinine urine ratio - CBC with Differential/Platelet - BASIC METABOLIC PANEL WITH GFR - Magnesium -  TSH  3. Hyperlipidemia, mixed  - EKG 12-Lead - [place], RETROPERITNL ABD,  LTD - Hepatic function panel - Lipid panel - TSH  4. Prediabetes  - EKG 12-Lead - [place], RETROPERITNL ABD,  LTD - Hemoglobin A1c - Insulin, random  5. Vitamin D deficiency  - VITAMIN D 25 Hydroxy  6. Screening for rectal cancer  - POC Hemoccult Bld/Stl   7. Screening for ischemic heart disease  - EKG 12-Lead  8. Screening for AAA (aortic abdominal aneurysm)  - [place], RETROPERITNL ABD,  LTD  9. Screening examination for pulmonary tuberculosis  - PPD  10. Fatigue  - Vitamin B12 - Iron and TIBC - CBC with Differential/Platelet  11. Medication management  - Urinalysis, Routine w reflex microscopic - Microalbumin / creatinine urine ratio - CBC with Differential/Platelet - BASIC METABOLIC PANEL WITH GFR - Hepatic function panel - Magnesium - Lipid panel - TSH - Hemoglobin A1c - Insulin, random - VITAMIN D 25 Hydroxy   12. Irritable bowel syndrome  - dicyclomine (BENTYL) 20 MG tablet; Take 1/2 to 1 tablet 3 x / day for abdominal cramping  Dispense: 90 tablet; Refill: 3      Patient was counseled in prudent diet to achieve/maintain BMI less than 25 for weight control, BP monitoring, regular exercise and medications. Discussed med's effects and SE's. Screening labs and tests as requested with regular follow-up as recommended. Over 40 minutes of exam, counseling, chart review and high complex critical decision making was performed.

## 2017-01-16 LAB — URINALYSIS, ROUTINE W REFLEX MICROSCOPIC
Bilirubin Urine: NEGATIVE
Glucose, UA: NEGATIVE
HGB URINE DIPSTICK: NEGATIVE
KETONES UR: NEGATIVE
Leukocytes, UA: NEGATIVE
NITRITE: NEGATIVE
PH: 6 (ref 5.0–8.0)
PROTEIN: NEGATIVE
Specific Gravity, Urine: 1.008 (ref 1.001–1.035)

## 2017-01-16 LAB — VITAMIN B12: Vitamin B-12: 461 pg/mL (ref 200–1100)

## 2017-01-16 LAB — HEMOGLOBIN A1C
Hgb A1c MFr Bld: 5.6 % (ref <5.7)
Mean Plasma Glucose: 114 mg/dL

## 2017-01-16 LAB — INSULIN, RANDOM: Insulin: 3.3 u[IU]/mL (ref 2.0–19.6)

## 2017-01-16 LAB — VITAMIN D 25 HYDROXY (VIT D DEFICIENCY, FRACTURES): Vit D, 25-Hydroxy: 56 ng/mL (ref 30–100)

## 2017-01-16 LAB — MICROALBUMIN / CREATININE URINE RATIO: Creatinine, Urine: 51 mg/dL (ref 20–320)

## 2017-01-16 LAB — MAGNESIUM: Magnesium: 1.9 mg/dL (ref 1.5–2.5)

## 2017-01-19 LAB — TB SKIN TEST
Induration: 0 mm
TB SKIN TEST: NEGATIVE

## 2017-02-11 ENCOUNTER — Other Ambulatory Visit: Payer: Self-pay

## 2017-02-11 DIAGNOSIS — Z1212 Encounter for screening for malignant neoplasm of rectum: Secondary | ICD-10-CM

## 2017-02-11 LAB — POC HEMOCCULT BLD/STL (HOME/3-CARD/SCREEN)
Card #2 Fecal Occult Blod, POC: NEGATIVE
FECAL OCCULT BLD: NEGATIVE
Fecal Occult Blood, POC: NEGATIVE

## 2017-03-12 ENCOUNTER — Ambulatory Visit (INDEPENDENT_AMBULATORY_CARE_PROVIDER_SITE_OTHER): Payer: 59 | Admitting: Physician Assistant

## 2017-03-12 ENCOUNTER — Encounter: Payer: Self-pay | Admitting: Physician Assistant

## 2017-03-12 VITALS — BP 122/68 | HR 70 | Temp 97.3°F | Resp 14 | Ht 64.5 in | Wt 139.0 lb

## 2017-03-12 DIAGNOSIS — R350 Frequency of micturition: Secondary | ICD-10-CM | POA: Diagnosis not present

## 2017-03-12 DIAGNOSIS — J0100 Acute maxillary sinusitis, unspecified: Secondary | ICD-10-CM | POA: Diagnosis not present

## 2017-03-12 DIAGNOSIS — N950 Postmenopausal bleeding: Secondary | ICD-10-CM

## 2017-03-12 MED ORDER — PREDNISONE 20 MG PO TABS: ORAL_TABLET | ORAL | 0 refills | Status: DC

## 2017-03-12 NOTE — Progress Notes (Signed)
Subjective:    Patient ID: [name], female    DOB: 1950/09/18, 66 y.o.   MRN: [id number]  HPI 66 y.o. AAF presents with sinusitis and PMB.  She has had 2 episodes of post menopausal bleeding after intercourse, pink on the paper, no pain. Last PAP 2011, has seen [facility] in the past. No discharge. No new sexual partners. Some burning with urination. Will refer to GYN and get [place]. Last menses 10 years ago.  Has had sore throat and congestion x 3 days, has had HA, cough without mucus, chills but no fever, has not taken any medications. She is not on zyrtec or flonase.   Blood pressure 122/68, pulse 70, temperature (!) 97.3 F (36.3 C), resp. rate 14, height 5' 4.5" (1.638 m), weight 139 lb (63 kg), SpO2 98 %.  Medications Current Outpatient Prescriptions on File Prior to Visit  Medication Sig  . alum & mag hydroxide-simeth (MAALOX/MYLANTA) 200-200-20 MG/5ML suspension Take 30 mLs by mouth every 6 (six) hours as needed for indigestion or heartburn.  Marland Kitchen aspirin 81 MG tablet Take 81 mg by mouth daily.  . cetirizine (ZYRTEC) 10 MG tablet Take 10 mg by mouth daily as needed for allergies.  . Cholecalciferol (D-3-5) 5000 UNITS capsule Take 5,000 Units by mouth daily.  . Flaxseed, Linseed, (FLAXSEED OIL PO) Take by mouth daily.  . fluticasone (FLONASE) 50 MCG/ACT nasal spray Place 2 sprays into both nostrils daily.  . Lutein 20 MG TABS Take by mouth.  . Multiple Vitamins-Minerals (WOMENS MULTI VITAMIN & MINERAL PO) Take 1 tablet by mouth daily.  . Omega-3 Fatty Acids (FISH OIL PO) Take by mouth daily.  . Probiotic Product (ALIGN) 4 MG CAPS Take by mouth daily.  . RESTASIS 0.05 % ophthalmic emulsion instill 1 drop into both eyes twice a day   No current facility-administered medications on file prior to visit.     Problem list She has GERD (gastroesophageal reflux disease); Prediabetes; Vitamin D deficiency; Hyperlipidemia; Elevated BP; Medication management; and BMI  23.04,  adult on her  problem list.   Review of Systems  Constitutional: Negative for chills, diaphoresis, fatigue and fever.  HENT: Positive for congestion, postnasal drip, sinus pressure, sneezing and sore throat. Negative for ear pain.   Respiratory: Positive for cough. Negative for chest tightness, shortness of breath and wheezing.   Cardiovascular: Negative.   Gastrointestinal: Negative.   Genitourinary: Negative.   Musculoskeletal: Negative for neck pain.  Neurological: Positive for headaches.       Objective:   Physical Exam  Constitutional: She appears well-developed and well-nourished.  HENT:  Head: Normocephalic and atraumatic.  Right Ear: External ear normal.  Nose: Right sinus exhibits maxillary sinus tenderness. Right sinus exhibits no frontal sinus tenderness. Left sinus exhibits maxillary sinus tenderness. Left sinus exhibits no frontal sinus tenderness.  Eyes: Conjunctivae and EOM are normal.  Neck: Normal range of motion. Neck supple.  Cardiovascular: Normal rate, regular rhythm, normal heart sounds and intact distal pulses.   Pulmonary/Chest: Effort normal and breath sounds normal. No respiratory distress. She has no wheezes.  Abdominal: Soft. Bowel sounds are normal.  Lymphadenopathy:    She has cervical adenopathy.  Skin: Skin is warm and dry.       Assessment & Plan:  Acute non-recurrent maxillary sinusitis - Discussed the importance of avoiding unnecessary antibiotic therapy. Suggested symptomatic OTC remedies. Nasal saline spray for congestion. Nasal steroids, allergy pill, oral steroids Follow up as needed. - predniSONE (DELTASONE) 20 MG tablet;  2 tablets daily for 3 days, 1 tablet daily for 4 days.  Dispense: 10 tablet; Refill: 0  Post-menopausal bleeding - Ambulatory referral to Gynecology- can get PAP there - US PELVIS TRANSVANGINAL NON-OB (TV ONLY); Future - US PELVIS (TRANSABDOMINAL ONLY); Future  Urinary frequency - Urinalysis w microscopic + reflex cultur

## 2017-03-12 NOTE — Patient Instructions (Addendum)
Make sure you are on an allergy pill, see below for more details. Please take the prednisone as directed below, this is NOT an antibiotic so you do NOT have to finish it. You can take it for a few days and stop it if you are doing better.   Please take the prednisone to help decrease inflammation and therefore decrease symptoms. Take it it with food to avoid GI upset. It can cause increased energy but on the other hand it can make it hard to sleep at night so please take it AT [place], it takes 8-12 hours to start working so it will NOT affect your sleeping if you take it at night with your food!!  If you are diabetic it will increase your sugars so decrease carbs and monitor your sugars closely.     - Try the Flonase or Nasonex. Remember to spray each nostril twice towards the outer part of your eye.  Do not sniff but instead pinch your nose and tilt your head back to help the medicine get into your sinuses.  The best time to do this is at bedtime.Stop if you get blurred vision or nose bleeds.     HOW TO TREAT VIRAL COUGH AND COLD SYMPTOMS:  -Symptoms usually last at least 1 week with the worst symptoms being around day 4.  - colds usually start with a sore throat and end with a cough, and the cough can take 2 weeks to get better.  -No antibiotics are needed for colds, flu, sore throats, cough, bronchitis UNLESS symptoms are longer than 7 days OR if you are getting better then get drastically worse.  -There are a lot of combination medications (Dayquil, Nyquil, Vicks 44, tyelnol cold and sinus, ETC). Please look at the ingredients on the back so that you are treating the correct symptoms and not doubling up on medications/ingredients.    Medicines you can use  Nasal congestion  - pseudoephedrine (Sudafed)- behind the counter, do not use if you have high blood pressure, medicine that have -D in them.  - phenylephrine (Sudafed PE) -Dextormethorphan + chlorpheniramine (Coridcidin HBP)-  okay if you have high blood pressure -Oxymetazoline (Afrin) nasal spray- LIMIT to 3 days -Saline nasal spray -Neti pot (used distilled or bottled water)  Ear pain/congestion  -pseudoephedrine (sudafed) - Nasonex/flonase nasal spray  Fever  -Acetaminophen (Tyelnol) -Ibuprofen (Advil, motrin, aleve)  Sore Throat  -Acetaminophen (Tyelnol) -Ibuprofen (Advil, motrin, aleve) -Drink a lot of water -Gargle with salt water - Rest your voice (don't talk) -Throat sprays -Cough drops  Body Aches  -Acetaminophen (Tyelnol) -Ibuprofen (Advil, motrin, aleve)  Headache  -Acetaminophen (Tyelnol) -Ibuprofen (Advil, motrin, aleve) - Exedrin, Exedrin Migraine  Allergy symptoms (cough, sneeze, runny nose, itchy eyes) -Claritin or loratadine cheapest but likely the weakest  -Zyrtec or certizine at night because it can make you sleepy -The strongest is allegra or fexafinadine  Cheapest at [employer], [employer], [employer]  Cough  -Dextromethorphan (Delsym)- medicine that has DM in it -Guafenesin (Mucinex/Robitussin) - cough drops - drink lots of water  Chest Congestion  -Guafenesin (Mucinex/Robitussin)  Red Itchy Eyes  - Naphcon-A  Upset Stomach  - Bland diet (nothing spicy, greasy, fried, and high acid foods like tomatoes, oranges, berries) -OKAY- cereal, bread, soup, crackers, rice -Eat smaller more frequent meals -reduce caffeine, no alcohol -Loperamide (Imodium-AD) if diarrhea -Prevacid for heart burn  General health when sick  -Hydration -wash your hands frequently -keep surfaces clean -change pillow cases and sheets often -Get  fresh air but do not exercise strenuously -Vitamin D, double up on it - Vitamin C -Zinc

## 2017-03-13 ENCOUNTER — Other Ambulatory Visit: Payer: Self-pay | Admitting: Physician Assistant

## 2017-03-13 DIAGNOSIS — N200 Calculus of kidney: Secondary | ICD-10-CM

## 2017-03-13 LAB — URINALYSIS W MICROSCOPIC + REFLEX CULTURE
Bacteria, UA: NONE SEEN /HPF
Bilirubin Urine: NEGATIVE
Glucose, UA: NEGATIVE
HGB URINE DIPSTICK: NEGATIVE
HYALINE CAST: NONE SEEN /LPF
Ketones, ur: NEGATIVE
Leukocyte Esterase: NEGATIVE
NITRITES URINE, INITIAL: NEGATIVE
PH: 5.5 (ref 5.0–8.0)
Protein, ur: NEGATIVE
SPECIFIC GRAVITY, URINE: 1.023 (ref 1.001–1.03)
WBC, UA: NONE SEEN /HPF (ref 0–5)

## 2017-03-13 LAB — NO CULTURE INDICATED

## 2017-03-13 NOTE — Progress Notes (Signed)
LVM for pt to return office call for LAB results.

## 2017-03-13 NOTE — Progress Notes (Signed)
Pt aware of lab results & voiced understanding of those results.

## 2017-03-20 ENCOUNTER — Ambulatory Visit
Admission: RE | Admit: 2017-03-20 | Discharge: 2017-03-20 | Disposition: A | Discharge status: Home / Self Care | Payer: 59 | Source: Ambulatory Visit | Attending: Physician Assistant | Admitting: Physician Assistant

## 2017-03-20 DIAGNOSIS — N950 Postmenopausal bleeding: Secondary | ICD-10-CM

## 2017-03-24 NOTE — Progress Notes (Signed)
Pt aware of lab results & voiced understanding of those results.

## 2017-04-06 ENCOUNTER — Ambulatory Visit (INDEPENDENT_AMBULATORY_CARE_PROVIDER_SITE_OTHER): Payer: 59 | Admitting: Gynecology

## 2017-04-06 ENCOUNTER — Encounter: Payer: Self-pay | Admitting: Gynecology

## 2017-04-06 VITALS — BP 120/78 | Ht 64.5 in | Wt 141.0 lb

## 2017-04-06 DIAGNOSIS — N950 Postmenopausal bleeding: Secondary | ICD-10-CM | POA: Diagnosis not present

## 2017-04-06 DIAGNOSIS — D259 Leiomyoma of uterus, unspecified: Secondary | ICD-10-CM

## 2017-04-06 DIAGNOSIS — N952 Postmenopausal atrophic vaginitis: Secondary | ICD-10-CM

## 2017-04-06 DIAGNOSIS — Z124 Encounter for screening for malignant neoplasm of cervix: Secondary | ICD-10-CM | POA: Diagnosis not present

## 2017-04-06 DIAGNOSIS — R6882 Decreased libido: Secondary | ICD-10-CM | POA: Diagnosis not present

## 2017-04-06 NOTE — Progress Notes (Addendum)
    [name] April 12, 1951 [id number]        66 y.o.  G0P0000 new patient presents with a history of postmenopausal bleeding over the past month. Initially noticed spotting after intercourse but has had several episodes of spontaneous spotting since then. No cramping, significant discomfort or pain. No history of gynecologic abnormalities previously.  Recent ultrasound showed 2 small myomas 1.7 cm and 1.4 cm. Endometrial echo 5 mm with lower uterine segment measurement 7 mm. Small amount of fluid noted within the endometrial cavity. Right left ovary small and atrophic in appearance.  Also had questions about decreased libido . Having decreased desire the past several years. No pain with intercourse. Same partner. No significant hot flushes, night sweats or vaginal dryness.  Past medical history,surgical history, problem list, medications, allergies, family history and social history were all reviewed and documented in the EPIC chart.  Directed ROS with pertinent positives and negatives documented in the history of present illness/assessment and plan.  Exam: [name] assistant Vitals:   04/06/17 1209  BP: 120/78  Weight: 141 lb (64 kg)  Height: 5' 4.5" (1.638 m)   General appearance:  Normal Abdomen soft nontender without masses guarding rebound Breasts examined lying and sitting without masses, retractions, discharge, adenopathy. Pelvic external BUS vagina with atrophic changes. Cervix with atrophic changes. Pap smear done. Uterus normal size midline mobile nontender. Adnexa without masses or tenderness.  Assessment/Plan:  66 y.o. G0P0000 with:  1. Postmenopausal spotting. Initially following intercourse but has occurred spontaneously since. No associated discomfort.  recent ultrasound showed endometrial echo 5 mm with focal area of 7 mm measurement. I reviewed with the patient the differential to include atrophic changes, polyps, hyperplasia up to including uterine cancer.  Ultrasound  report 11/2014 showed endometrial echo 2 mm. Recommended patient follow up for sonohysterogram for better endometrial visualization and endometrial sampling. Patient agrees to schedule and will follow up for this. Pap smear was done today. 2. Leiomyoma. Patient and I discussed leiomyoma, etiology, commonality in the usual course with them. Do not feel that they are related to her bleeding. Would recommend following with annual exams and she is easy to examine as long as the uterus remained stable in size them will follow expectantly. 3. Decreased libido. I reviewed the physiology/psychology of libido and not unusual to see decreased libido with aging. There is imports to include hormone levels, overall health and aging, longevity of relationships, societal expectations were all reviewed. Options for management to include possible low-dose testosterone supplementation discussed. Side effects and risks also reviewed. At this point the patient is not interested in intervention but is comfortable monitoring for now. 4. Patient unsure when last breast exam was done. Breast exam performed today and was normal. Patient is overdue for her mammography with last mammogram reported 12/2015. Patient agrees to call and schedule.  Pap smear done today. 5. Health maintenance. Patient will continue to follow up with her primary physician for general health maintenance noting recent exam August 2018.  Greater than 50% of my 30 minute visit was spent in direct face to face counseling and coordination of care with the patient.     Anastasio Auerbach MD, 1:00 PM 04/06/2017

## 2017-04-06 NOTE — Patient Instructions (Signed)
Follow up for ultrasound as scheduled 

## 2017-04-08 LAB — PAP IG W/ RFLX HPV ASCU

## 2017-04-13 ENCOUNTER — Other Ambulatory Visit: Payer: Self-pay | Admitting: Gynecology

## 2017-04-13 DIAGNOSIS — N950 Postmenopausal bleeding: Secondary | ICD-10-CM

## 2017-04-13 DIAGNOSIS — D251 Intramural leiomyoma of uterus: Secondary | ICD-10-CM

## 2017-04-14 ENCOUNTER — Telehealth: Payer: Self-pay | Admitting: *Deleted

## 2017-04-14 NOTE — Telephone Encounter (Signed)
Pt informed with normal pap results on 04/06/17

## 2017-04-23 ENCOUNTER — Ambulatory Visit (INDEPENDENT_AMBULATORY_CARE_PROVIDER_SITE_OTHER): Payer: 59

## 2017-04-23 ENCOUNTER — Encounter: Payer: Self-pay | Admitting: Gynecology

## 2017-04-23 ENCOUNTER — Ambulatory Visit (INDEPENDENT_AMBULATORY_CARE_PROVIDER_SITE_OTHER): Payer: 59 | Admitting: Gynecology

## 2017-04-23 VITALS — BP 118/74

## 2017-04-23 DIAGNOSIS — D259 Leiomyoma of uterus, unspecified: Secondary | ICD-10-CM | POA: Diagnosis not present

## 2017-04-23 DIAGNOSIS — D251 Intramural leiomyoma of uterus: Secondary | ICD-10-CM | POA: Diagnosis not present

## 2017-04-23 DIAGNOSIS — N950 Postmenopausal bleeding: Secondary | ICD-10-CM

## 2017-04-23 NOTE — Patient Instructions (Signed)
Office will call you with biopsy results 

## 2017-04-23 NOTE — Progress Notes (Signed)
    [name] 01-21-1951 [id number]        66 y.o.  G0P0000 presents for sonohysterogram.  History of postmenopausal bleeding over the past month with several episodes of spontaneous spotting.  Ultrasound showed endometrial echo at 5 mm although in the lower uterine segment measured 7 mm.  2 small myomas also noted at 1.7 cm and 1.4 cm.  Right and left ovaries were noted to be atrophic in appearance.  Past medical history,surgical history, problem list, medications, allergies, family history and social history were all reviewed and documented in the EPIC chart.  Directed ROS with pertinent positives and negatives documented in the history of present illness/assessment and plan.  Exam: [place] assistant BP 118/74 General appearance:  Normal Abdomen soft nontender without masses guarding rebound Pelvic external BUS vagina with atrophic changes.  Cervix atrophic.  Uterus grossly normal midline mobile nontender.  Adnexa without masses or tenderness.  Ultrasound transvaginal and transabdominal shows uterus normal size with 2 small myomas 21 mm and 17 mm.  Endometrial echo 1.9 mm noting endometrial fluid within the cavity 1.7 x 16.9 mm.  Right ovary normal.  Left ovary not visualized.  Left adnexa without pathology.  Cul-de-sac negative  Sonohysterogram performed, sterile technique, single-tooth tenaculum anterior lip stabilization, disposable dilator used to allow for catheter placement.  Good distention with no abnormalities.  Endometrial sample taken.  Patient tolerated well.  Assessment/Plan:  66 y.o. G0P0000 with postmenopausal spotting which I feel was most likely atrophic in origin given thin endometrial echo.  Patient will follow-up for biopsy results.  Assuming biopsy negative then we will plan expectant management with reporting any further bleeding.  I reviewed with her and her husband possibilities of an inadequate biopsy return given the thin endometrium which would be appropriate  given her situation.   Anastasio Auerbach MD, 5:01 PM 04/23/2017

## 2017-05-06 IMAGING — CR DG CHEST 2V
2 series · 2 of 2 positions shown · non-contrast
Comparison: 03/07/2014 and prior exams

CLINICAL DATA: 64-year-old female with acute chest pain and
shortness of breath today.

EXAM:
CHEST  2 VIEW

[w chest pa]
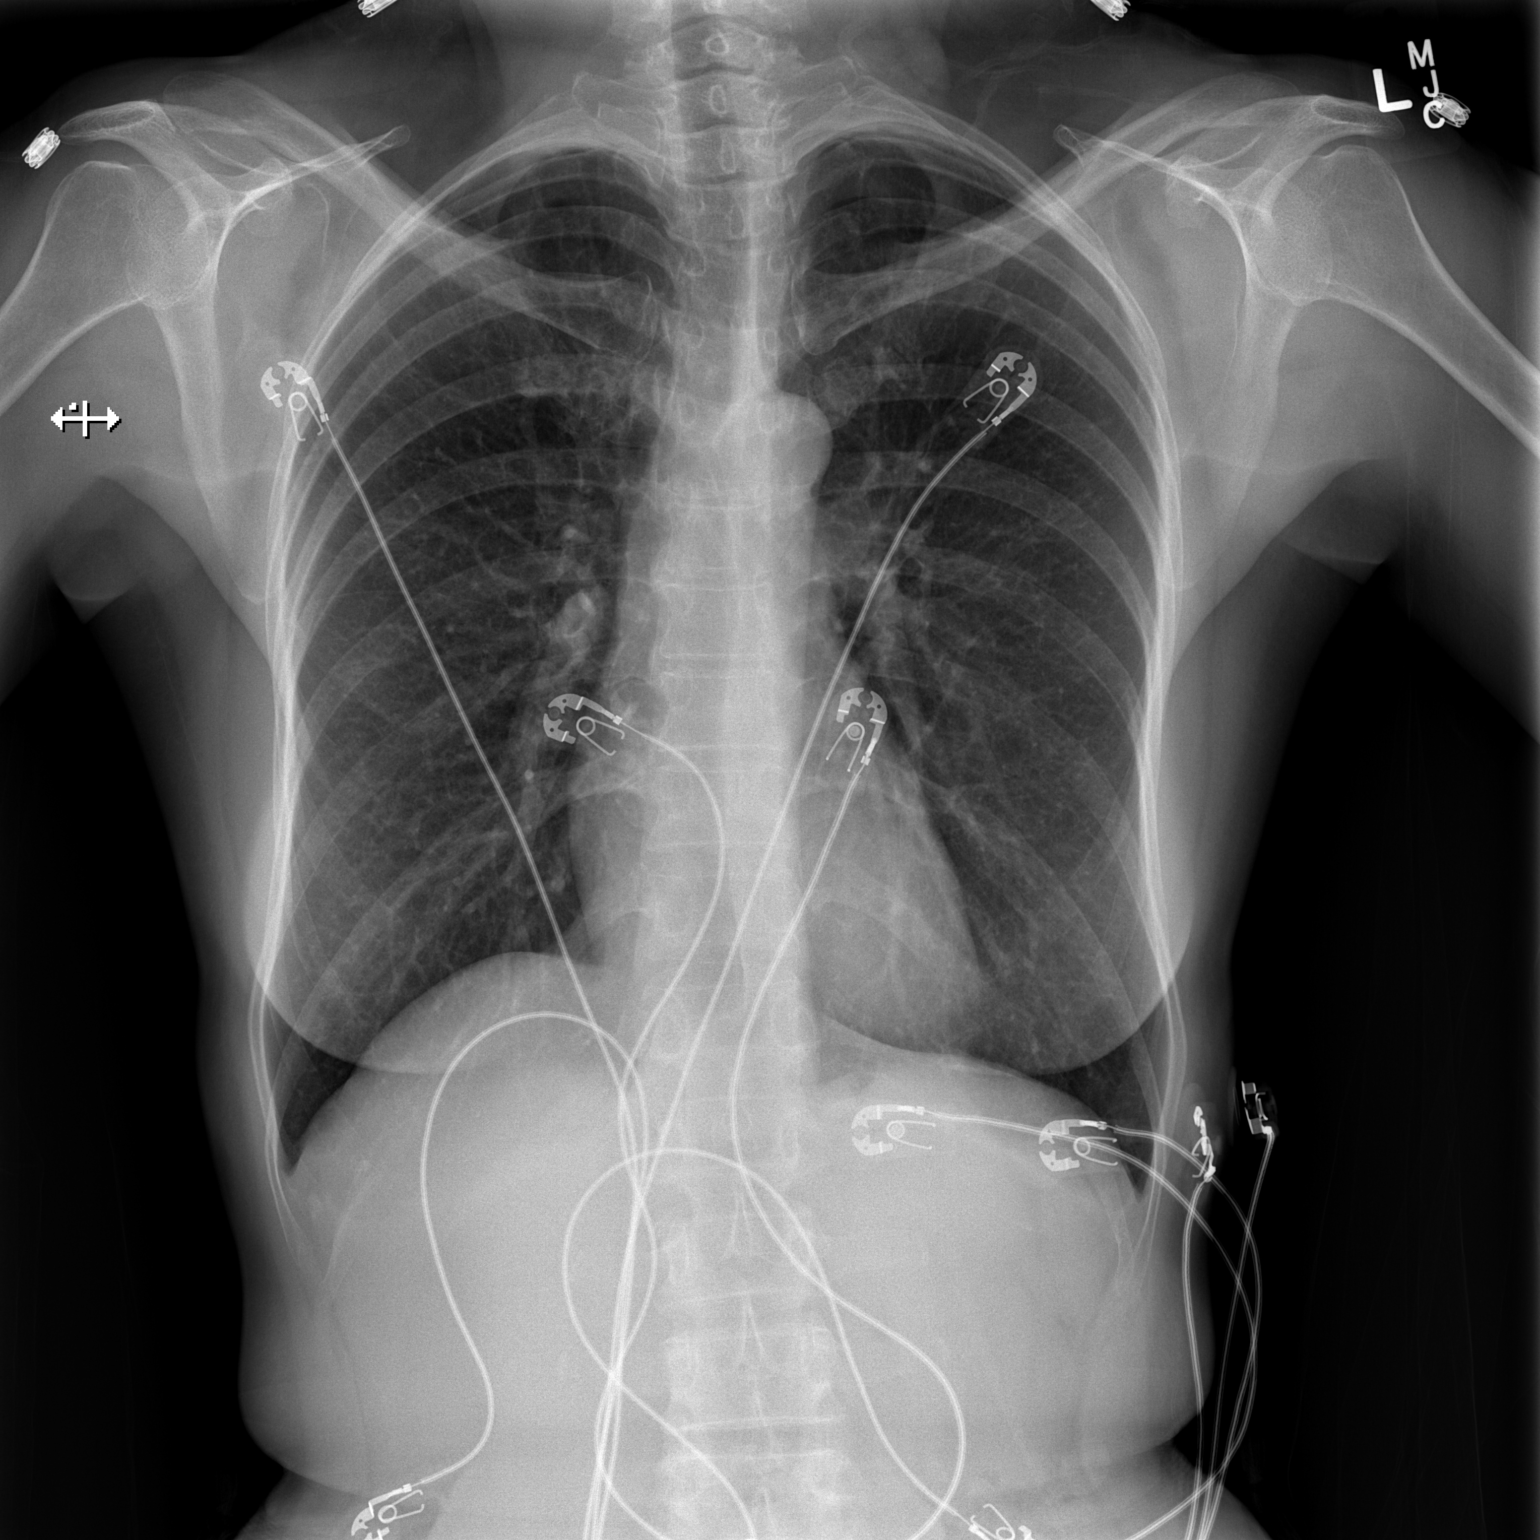

[w chest lat]
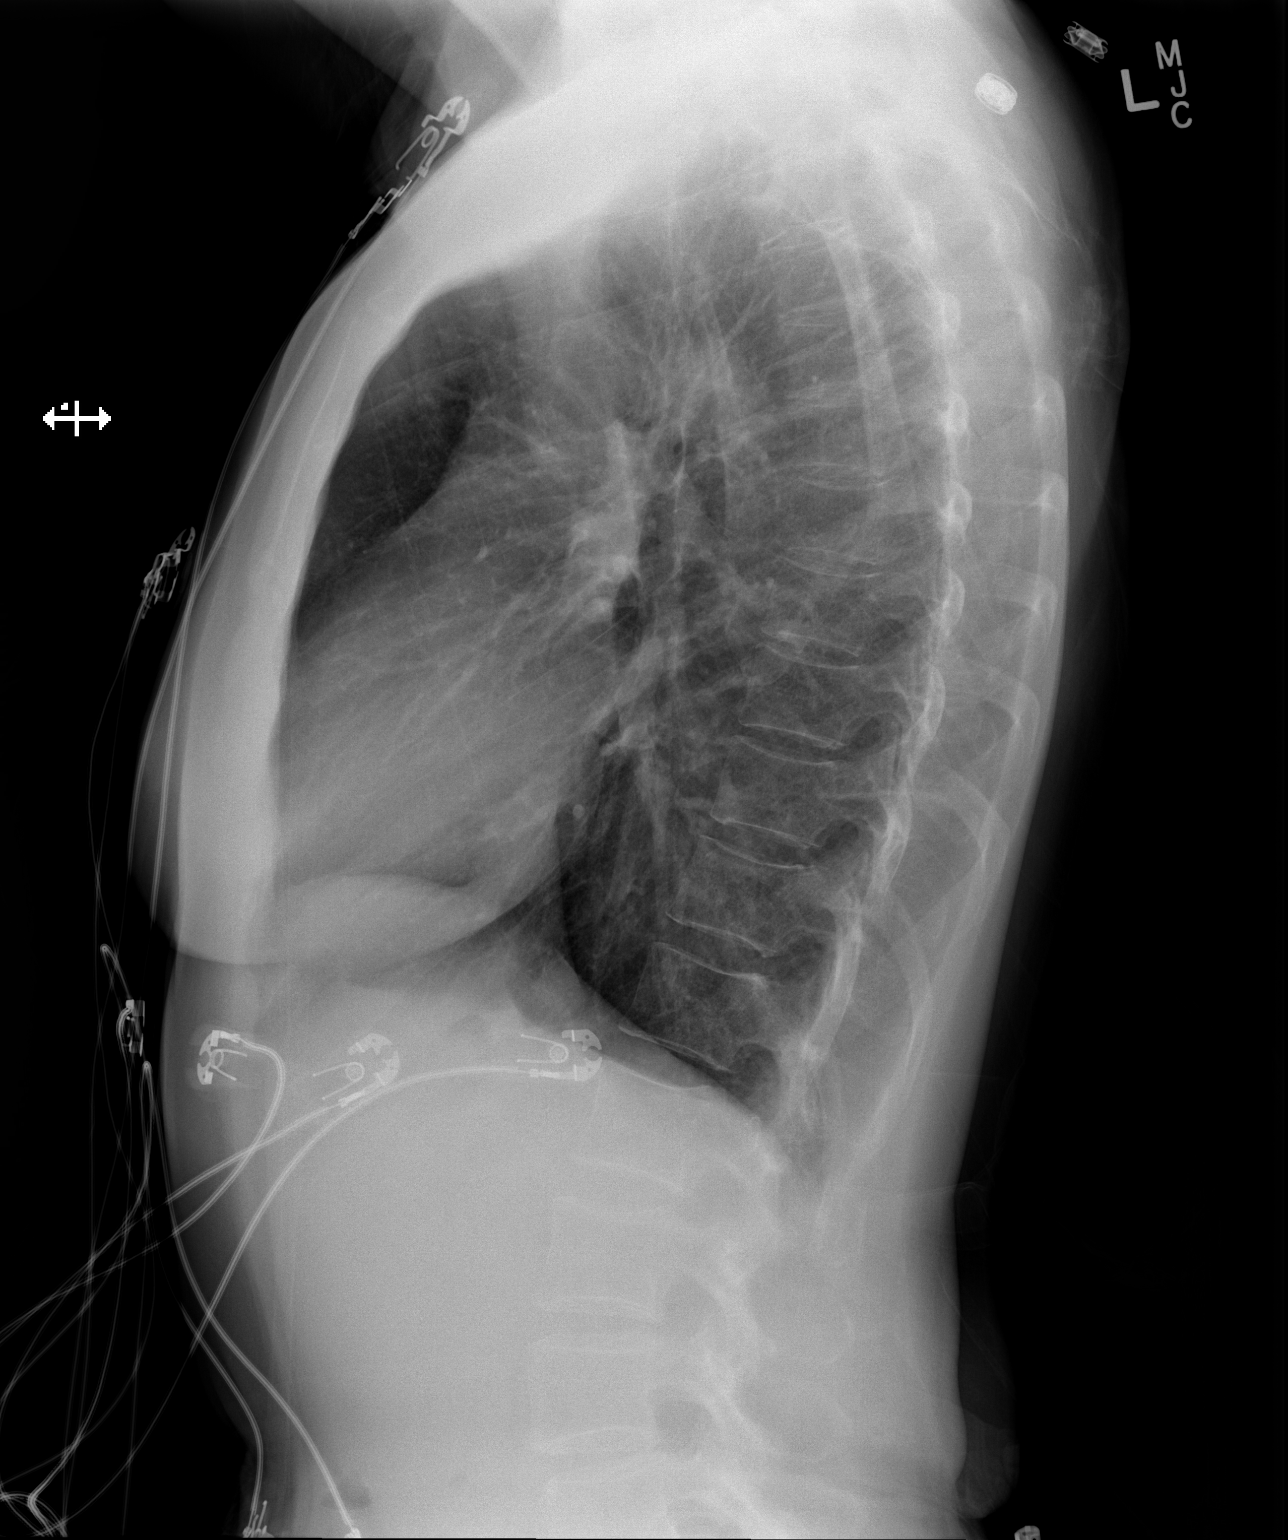

[2 of 2 positions shown; findings below may reference images not displayed]

FINDINGS: The cardiomediastinal silhouette is unremarkable.

There is no evidence of focal airspace disease, pulmonary edema,
suspicious pulmonary nodule/mass, pleural effusion, or pneumothorax.
No acute bony abnormalities are identified.
IMPRESSION: No active cardiopulmonary disease.

## 2017-05-18 ENCOUNTER — Ambulatory Visit: Payer: Self-pay | Admitting: Physician Assistant

## 2017-05-26 ENCOUNTER — Ambulatory Visit: Payer: Self-pay | Admitting: Physician Assistant

## 2017-06-25 ENCOUNTER — Encounter: Payer: Self-pay | Admitting: Physician Assistant

## 2017-06-25 ENCOUNTER — Ambulatory Visit: Payer: Medicare Other | Admitting: Physician Assistant

## 2017-06-25 VITALS — BP 124/70 | HR 61 | Temp 97.2°F | Resp 16 | Ht 64.5 in | Wt 140.0 lb

## 2017-06-25 DIAGNOSIS — H6123 Impacted cerumen, bilateral: Secondary | ICD-10-CM | POA: Diagnosis not present

## 2017-06-25 DIAGNOSIS — E785 Hyperlipidemia, unspecified: Secondary | ICD-10-CM

## 2017-06-25 DIAGNOSIS — Z114 Encounter for screening for human immunodeficiency virus [HIV]: Secondary | ICD-10-CM | POA: Diagnosis not present

## 2017-06-25 DIAGNOSIS — Z1159 Encounter for screening for other viral diseases: Secondary | ICD-10-CM | POA: Diagnosis not present

## 2017-06-25 DIAGNOSIS — R519 Headache, unspecified: Secondary | ICD-10-CM

## 2017-06-25 DIAGNOSIS — Z23 Encounter for immunization: Secondary | ICD-10-CM

## 2017-06-25 DIAGNOSIS — G8929 Other chronic pain: Secondary | ICD-10-CM

## 2017-06-25 DIAGNOSIS — Z136 Encounter for screening for cardiovascular disorders: Secondary | ICD-10-CM | POA: Diagnosis not present

## 2017-06-25 DIAGNOSIS — K219 Gastro-esophageal reflux disease without esophagitis: Secondary | ICD-10-CM

## 2017-06-25 DIAGNOSIS — Z79899 Other long term (current) drug therapy: Secondary | ICD-10-CM

## 2017-06-25 DIAGNOSIS — H9203 Otalgia, bilateral: Secondary | ICD-10-CM

## 2017-06-25 DIAGNOSIS — R7309 Other abnormal glucose: Secondary | ICD-10-CM

## 2017-06-25 DIAGNOSIS — Z0001 Encounter for general adult medical examination with abnormal findings: Secondary | ICD-10-CM

## 2017-06-25 DIAGNOSIS — I10 Essential (primary) hypertension: Secondary | ICD-10-CM | POA: Diagnosis not present

## 2017-06-25 DIAGNOSIS — E2839 Other primary ovarian failure: Secondary | ICD-10-CM

## 2017-06-25 DIAGNOSIS — R51 Headache: Secondary | ICD-10-CM | POA: Diagnosis not present

## 2017-06-25 DIAGNOSIS — R6889 Other general symptoms and signs: Secondary | ICD-10-CM | POA: Diagnosis not present

## 2017-06-25 DIAGNOSIS — Z7189 Other specified counseling: Secondary | ICD-10-CM

## 2017-06-25 DIAGNOSIS — R0989 Other specified symptoms and signs involving the circulatory and respiratory systems: Secondary | ICD-10-CM

## 2017-06-25 DIAGNOSIS — E559 Vitamin D deficiency, unspecified: Secondary | ICD-10-CM

## 2017-06-25 DIAGNOSIS — Z6823 Body mass index (BMI) 23.0-23.9, adult: Secondary | ICD-10-CM | POA: Diagnosis not present

## 2017-06-25 DIAGNOSIS — Z0000 Encounter for general adult medical examination without abnormal findings: Secondary | ICD-10-CM

## 2017-06-25 MED ORDER — AMOXICILLIN-POT CLAVULANATE 875-125 MG PO TABS: 1.0000 | ORAL_TABLET | Freq: Two times a day (BID) | ORAL | 0 refills | Status: AC

## 2017-06-25 NOTE — Patient Instructions (Addendum)
Follow up with Dr. Herbert Deaner  Get on antibiotic Get on zantac once or twice a day  Heating pad or warm wash cloth to right ear/chin  Use a dropper or use a cap to put peroxide, olive oil,mineral oil or canola oil in the effected ear- 2-3 times a week. Let it soak for 20-30 min then you can take a shower or use a baby bulb with warm water to wash out the ear wax.  Do not use Qtips  Will go to the ER if worsening headache, changes vision/speech, imbalance, weakness.   What is the TMJ? The temporomandibular (tem-PUH-ro-man-DIB-yoo-ler) joint, or the TMJ, connects the upper and lower jawbones. This joint allows the jaw to open wide and move back and forth when you chew, talk, or yawn.There are also several muscles that help this joint move. There can be muscle tightness and pain in the muscle that can cause several symptoms.  What causes TMJ pain? There are many causes of TMJ pain. Repeated chewing (for example, chewing gum) and clenching your teeth can cause pain in the joint. Some TMJ pain has no obvious cause. What can I do to ease the pain? There are many things you can do to help your pain get better. When you have pain:  Eat soft foods and stay away from chewy foods (for example, taffy) Try to use both sides of your mouth to chew Don't chew gum Massage Don't open your mouth wide (for example, during yawning or singing) Don't bite your cheeks or fingernails Lower your amount of stress and worry Applying a warm, damp washcloth to the joint may help. Over-the-counter pain medicines such as ibuprofen (one brand: Advil) or acetaminophen (one brand: Tylenol) might also help. Do not use these medicines if you are allergic to them or if your doctor told you not to use them. How can I stop the pain from coming back? When your pain is better, you can do these exercises to make your muscles stronger and to keep the pain from coming back:  Resisted mouth opening: Place your thumb or two fingers  under your chin and open your mouth slowly, pushing up lightly on your chin with your thumb. Hold for three to six seconds. Close your mouth slowly. Resisted mouth closing: Place your thumbs under your chin and your two index fingers on the ridge between your mouth and the bottom of your chin. Push down lightly on your chin as you close your mouth. Tongue up: Slowly open and close your mouth while keeping the tongue touching the roof of the mouth. Side-to-side jaw movement: Place an object about one fourth of an inch thick (for example, two tongue depressors) between your front teeth. Slowly move your jaw from side to side. Increase the thickness of the object as the exercise becomes easier Forward jaw movement: Place an object about one fourth of an inch thick between your front teeth and move the bottom jaw forward so that the bottom teeth are in front of the top teeth. Increase the thickness of the object as the exercise becomes easier. These exercises should not be painful. If it hurts to do these exercises, stop doing them and talk to your family doctor.   GETTING OFF OF PPI's    Nexium/protonix/prilosec/Omeprazole/Dexilant/Aciphex are called PPI's, they are great at healing your stomach but should only be taken for a short period of time.     Recent studies have shown that taken for a long time they  can increase  the risk of osteoporosis (weakening of your bones), pneumonia, low magnesium, restless legs, Cdiff (infection that causes diarrhea), DEMENTIA and most recently kidney damage / disease / insufficiency.     Due to this information we want to try to stop the PPI but if you try to stop it abruptly this can cause rebound acid and worsening symptoms.   So this is how we want you to get off the PPI: Generic is always fine!!  - Start taking the nexium/protonix/prilosec/PPI  every other day with  zantac (ranitidine) OR pepcid famotadine 2 x a day for 2-4 weeks - some people stay on this  dosage and can not taper off further. Our main goal is to limit the dosage and amount you are taking so if you need to stay on this dose.   - then decrease the PPI to every 3 days while taking the zantac or pepcid 300mg  twice a day the other  days for 2-4  Weeks  - then you can try the zantac or pepcid 300mg  once at night or up to 2 x day as needed.  - you can continue on this once at night or stop all together  - Avoid alcohol, spicy foods, NSAIDS (aleve, ibuprofen) at this time. See foods below.   +++++++++++++++++++++++++++++++++++++++++++  Food Choices for Gastroesophageal Reflux Disease  When you have gastroesophageal reflux disease (GERD), the foods you eat and your eating habits are very important. Choosing the right foods can help ease the discomfort of GERD. WHAT GENERAL GUIDELINES DO I NEED TO FOLLOW?  Choose fruits, vegetables, whole grains, low-fat dairy products, and low-fat meat, fish, and poultry.  Limit fats such as oils, salad dressings, butter, nuts, and avocado.  Keep a food diary to identify foods that cause symptoms.  Avoid foods that cause reflux. These may be different for different people.  Eat frequent small meals instead of three large meals each day.  Eat your meals slowly, in a relaxed setting.  Limit fried foods.  Cook foods using methods other than frying.  Avoid drinking alcohol.  Avoid drinking large amounts of liquids with your meals.  Avoid bending over or lying down until 2-3 hours after eating.   WHAT FOODS ARE NOT RECOMMENDED? The following are some foods and drinks that may worsen your symptoms:  Vegetables Tomatoes. Tomato juice. Tomato and spaghetti sauce. Chili peppers. Onion and garlic. Horseradish. Fruits Oranges, grapefruit, and lemon (fruit and juice). Meats High-fat meats, fish, and poultry. This includes hot dogs, ribs, ham, sausage, salami, and bacon. Dairy Whole milk and chocolate milk. Sour cream. Cream. Butter. Ice  cream. Cream cheese.  Beverages Coffee and tea, with or without caffeine. Carbonated beverages or energy drinks. Condiments Hot sauce. Barbecue sauce.  Sweets/Desserts Chocolate and cocoa. Donuts. Peppermint and spearmint. Fats and Oils High-fat foods, including [place] fries and potato chips. Other Vinegar. Strong spices, such as black pepper, white pepper, red pepper, cayenne, curry powder, cloves, ginger, and chili powder.

## 2017-06-25 NOTE — Progress Notes (Signed)
WELCOME TO MEDICARE ANNUAL WELLNESS VISIT AND FOLLOW UP  Assessment:   Estrogen deficiency -     DG Bone Density; Future  Encounter for screening for HIV -     HIV antibody  Encounter for hepatitis C screening test for low risk patient -     Hepatitis C antibody  Hyperlipidemia, unspecified hyperlipidemia type -continue medications, check lipids, decrease fatty foods, increase activity.  -     Lipid panel  Labile hypertension - continue medications, DASH diet, exercise and monitor at home. Call if greater than 130/80.  -     CBC with Differential/Platelet -     BASIC METABOLIC PANEL WITH GFR -     Hepatic function panel -     TSH -     [place], RETROPERITNL ABD,  LTD -     EKG 12-Lead  Abnormal glucose -     Hemoglobin A1c  Vitamin D deficiency Continue supplement  Medication management -     Magnesium  BMI  23.04,  adult Monitor  Gastroesophageal reflux disease, esophagitis presence not specified Get on zantac twice a day  Chronic nonintractable headache, unspecified headache type Normal neuro Likely more tooth abscess that she is following up with dentist for or TMJ Will go to the ER if worsening headache, changes vision/speech, imbalance, weakness. -     Sedimentation rate -     amoxicillin-clavulanate (AUGMENTIN) 875-125 MG tablet; Take 1 tablet by mouth 2 (two) times daily for 7 days.  Ear pain, bilateral and excessive ear cerumen - stop using Qtips, irrigation used in the office without complications, use OTC drops/oil at home to prevent reoccurence -     amoxicillin-clavulanate (AUGMENTIN) 875-125 MG tablet; Take 1 tablet by mouth 2 (two) times daily for 7 days.  Needs flu shot -     Flu vaccine HIGH DOSE PF  Welcome to Medicare preventive visit 1 year Flu shot today, out of pneumonia, get next OV  Advanced directives, counseling/discussion Discussed advanced care planning with patient and/or family At least 30 mins discussed with the patient and/or  family at the visit.   Over 40 minutes of exam, counseling, chart review and critical decision making was performed Future Appointments  Date Time Provider [place]  07/16/2017 10:30 AM Vicie Mutters, PA-C [facility] None  01/28/2018  9:00 AM Unk Pinto, MD [facility] None     Plan:   During the course of the visit the patient was educated and counseled about appropriate screening and preventive services including:    Pneumococcal vaccine   Prevnar 13  Influenza vaccine  Td vaccine  Screening electrocardiogram  Bone densitometry screening  Colorectal cancer screening  Diabetes screening  Glaucoma screening  Nutrition counseling   Advanced directives: requested   Subjective:  [name] is a 67 y.o. female who presents for Medicare Annual Wellness Visit and 3 month follow up.   She has had headaches x Oct, frontal HA above her eyebrows, some soreness on her scalp. Happens everyday. Feels a pressure. Worse in the middle of the day. Some dizziness with the HA, blurry vision, worse with looking at electronics. Saw Dr. Herbert Deaner June/July. She has sinus issues with drainage, no fever, chills. No decreased hearing, popping clicking in her ears but pain in her right ear with some right sided neck pain as well. Normal MRI 2014 of brain. Has abscess right lower jaw, following up with dentist. She has been taking iburpofen, has GERD/AB pain LUQ.   Last week slipped  on some stairs and fell her buttocks, no LOC, did not hit head. Has some back pain.   Her blood pressure has been controlled at home, today their BP is BP: 124/70 She does not workout, just got silver sneakers account, retired x 1 month. She denies chest pain, shortness of breath, dizziness.  She is not on cholesterol medication and denies myalgias. Her cholesterol is at goal. The cholesterol last visit was:   Lab Results  Component Value Date   CHOL 198 01/15/2017   HDL 90 01/15/2017   LDLCALC 98  01/15/2017   TRIG 51 01/15/2017   CHOLHDL 2.2 01/15/2017    Last A1C in the office was:  Lab Results  Component Value Date   HGBA1C 5.6 01/15/2017   Last GFR: Lab Results  Component Value Date   GFRAA >89 01/15/2017   Patient is on Vitamin D supplement.   Lab Results  Component Value Date   VD25OH 56 01/15/2017     BMI is Body mass index is 23.66 kg/m., she is working on diet and exercise. Wt Readings from Last 3 Encounters:  06/25/17 140 lb (63.5 kg)  04/06/17 141 lb (64 kg)  03/12/17 139 lb (63 kg)    Medication Review: Current Outpatient Medications on File Prior to Visit  Medication Sig Dispense Refill  . alum & mag hydroxide-simeth (MAALOX/MYLANTA) 200-200-20 MG/5ML suspension Take 30 mLs by mouth every 6 (six) hours as needed for indigestion or heartburn.    Marland Kitchen aspirin 81 MG tablet Take 81 mg by mouth daily.    . cetirizine (ZYRTEC) 10 MG tablet Take 10 mg by mouth daily as needed for allergies.    . Cholecalciferol (D-3-5) 5000 UNITS capsule Take 5,000 Units by mouth daily.    . Flaxseed, Linseed, (FLAXSEED OIL PO) Take by mouth daily.    . fluticasone (FLONASE) 50 MCG/ACT nasal spray Place 2 sprays into both nostrils daily. 16 g 0  . Lutein 20 MG TABS Take by mouth.    . Multiple Vitamins-Minerals (WOMENS MULTI VITAMIN & MINERAL PO) Take 1 tablet by mouth daily.    . Omega-3 Fatty Acids (FISH OIL PO) Take by mouth daily.    . Probiotic Product (ALIGN) 4 MG CAPS Take by mouth daily.    . RESTASIS 0.05 % ophthalmic emulsion instill 1 drop into both eyes twice a day  0   No current facility-administered medications on file prior to visit.     Allergies  Allergen Reactions  . Bactrim [Sulfamethoxazole-Trimethoprim] Other (See Comments)    Patient preference to take medication without sulfa    Current Problems (verified) Patient Active Problem List   Diagnosis Date Noted  . Abnormal glucose 06/25/2017  . BMI  23.04,  adult 04/14/2015  . Medication management  12/12/2014  . 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 GERD (gastroesophageal reflux disease)   . Vitamin D deficiency   . Hyperlipidemia     Screening Tests Immunization History  Administered Date(s) Administered  . Influenza, High Dose Seasonal PF 06/25/2017  . PPD Test 12/12/2014, 12/26/2015, 01/15/2017  . Td 05/18/2007  . Zoster 05/17/2006   Preventative care: Last colonoscopy: 08/17/2015 [facility] Last mammogram: at [facility] Sept 2018 Last pap smear/pelvic exam: 04/06/2017 DEXA: DUE CXR 12/2016 MRI brain 2014 CT AB 2015  Prior vaccinations: TD or Tdap: 2008  Influenza: did not get Pneumococcal: N/A Prevnar13: out of in the office Shingles/Zostavax: N/A  Names of Other Physician/Practitioners you currently use: 1. [facility] here  for primary care 2. Dr. Herbert Deaner, eye doctor, wears glasses 3. Dr. Johnnye Sima, dentist Patient Care Team: Unk Pinto, MD as PCP - General (Internal Medicine) Juanita Craver, MD as Consulting Physician (Gastroenterology) Monna Fam, MD as Consulting Physician (Ophthalmology) Charolette Forward, MD as Consulting Physician (Cardiology)  SURGICAL HISTORY She  has a past surgical history that includes Cardiac catheterization (2011) and Radioactive seed guided excisional breast biopsy (Right, 03/11/2016). FAMILY HISTORY Her family history includes Cancer in her maternal grandmother; Diabetes in her father. SOCIAL HISTORY She  reports that  has never smoked. she has never used smokeless tobacco. She reports that she drinks alcohol. She reports that she does not use drugs.   MEDICARE WELLNESS OBJECTIVES: Physical activity: Current Exercise Habits: The patient does not participate in regular exercise at present Cardiac risk factors: Cardiac Risk Factors include: advanced age (>69men, >61 women);sedentary lifestyle Depression/mood screen:   Depression screen [facility] 2/9 06/25/2017  Decreased Interest 0  Down, Depressed,  Hopeless 0  PHQ - 2 Score 0    ADLs:  In your present state of health, do you have any difficulty performing the following activities: 06/25/2017 01/15/2017  Hearing? N N  Vision? N N  Difficulty concentrating or making decisions? N N  Walking or climbing stairs? N N  Dressing or bathing? N N  Doing errands, shopping? N N  Some recent data might be hidden     Cognitive Testing  Alert? Yes  Normal Appearance?Yes  Oriented to person? Yes  Place? Yes   Time? Yes  Recall of three objects?  Yes  Can perform simple calculations? Yes  Displays appropriate judgment?Yes  Can read the correct time from a watch face?Yes  EOL planning: Does Patient Have a Medical Advance Directive?: No Would patient like information on creating a medical advance directive?: Yes (MAU/Ambulatory/Procedural Areas - Information given)  ROS See HPI  Objective:     Today's Vitals   06/25/17 1059  BP: 124/70  Pulse: 61  Resp: 16  Temp: (!) 97.2 F (36.2 C)  SpO2: 95%  Weight: 140 lb (63.5 kg)  Height: 5' 4.5" (1.638 m)   Body mass index is 23.66 kg/m.  General appearance: alert, no distress, WD/WN, female HEENT: normocephalic, sclerae anicteric, TMs pearly after bilateral cerumen removal, nares patent, no discharge or erythema, pharynx normal, + TMJ, nontender temporal  Oral cavity: MMM, no lesions Neck: supple, no lymphadenopathy, no thyromegaly, no masses Heart: RRR, normal S1, S2, no murmurs Lungs: CTA bilaterally, no wheezes, rhonchi, or rales Abdomen: +bs, soft, + epigastric tenderness, non distended, no masses, no hepatomegaly, no splenomegaly Musculoskeletal: nontender, no swelling, no obvious deformity Extremities: no edema, no cyanosis, no clubbing Pulses: 2+ symmetric, upper and lower extremities, normal cap refill Neurological: alert, oriented x 3, CN2-12 intact, strength normal upper extremities and lower extremities, sensation normal throughout, DTRs 2+ throughout, no cerebellar signs,  gait normal Psychiatric: normal affect, behavior normal, pleasant   EKG WNL no ST changes Aorta Scan 3.2 cm but patient had normal AB [place] 09/2016, will monitor  Medicare Attestation I have personally reviewed: The patient's medical and social history Their use of alcohol, tobacco or illicit drugs Their current medications and supplements The patient's functional ability including ADLs,fall risks, home safety risks, cognitive, and hearing and visual impairment Diet and physical activities Evidence for depression or mood disorders  The patient's weight, height, BMI, and visual acuity have been recorded in the chart.  I have made referrals, counseling, and provided education to the patient based  on review of the above and I have provided the patient with a written personalized care plan for preventive services.     Vicie Mutters, PA-C   06/25/2017

## 2017-06-26 LAB — BASIC METABOLIC PANEL WITH GFR
BUN: 12 mg/dL (ref 7–25)
CHLORIDE: 106 mmol/L (ref 98–110)
CO2: 28 mmol/L (ref 20–32)
CREATININE: 0.76 mg/dL (ref 0.50–0.99)
Calcium: 9.5 mg/dL (ref 8.6–10.4)
GFR, Est African American: 95 mL/min/{1.73_m2} (ref >=60)
GFR, Est Non African American: 82 mL/min/{1.73_m2} (ref >=60)
Glucose, Bld: 79 mg/dL (ref 65–99)
Potassium: 4.5 mmol/L (ref 3.5–5.3)
SODIUM: 139 mmol/L (ref 135–146)

## 2017-06-26 LAB — CBC WITH DIFFERENTIAL/PLATELET
BASOS PCT: 1 %
Basophils Absolute: 31 cells/uL (ref 0–200)
EOS PCT: 2.9 %
Eosinophils Absolute: 90 cells/uL (ref 15–500)
HCT: 39.6 % (ref 35.0–45.0)
HEMOGLOBIN: 13.4 g/dL (ref 11.7–15.5)
Lymphs Abs: 1541 cells/uL (ref 850–3900)
MCH: 28.5 pg (ref 27.0–33.0)
MCHC: 33.8 g/dL (ref 32.0–36.0)
MCV: 84.3 fL (ref 80.0–100.0)
MPV: 12.1 fL (ref 7.5–12.5)
Monocytes Relative: 7.1 %
NEUTROS ABS: 1218 {cells}/uL — AB (ref 1500–7800)
Neutrophils Relative %: 39.3 %
PLATELETS: 184 10*3/uL (ref 140–400)
RBC: 4.7 10*6/uL (ref 3.80–5.10)
RDW: 12.4 % (ref 11.0–15.0)
TOTAL LYMPHOCYTE: 49.7 %
WBC mixed population: 220 cells/uL (ref 200–950)
WBC: 3.1 10*3/uL — AB (ref 3.8–10.8)

## 2017-06-26 LAB — HEMOGLOBIN A1C
Hgb A1c MFr Bld: 5.7 % of total Hgb — ABNORMAL HIGH (ref <5.7)
Mean Plasma Glucose: 117 (calc)
eAG (mmol/L): 6.5 (calc)

## 2017-06-26 LAB — HIV ANTIBODY (ROUTINE TESTING W REFLEX): HIV 1&2 Ab, 4th Generation: NONREACTIVE

## 2017-06-26 LAB — LIPID PANEL
CHOLESTEROL: 236 mg/dL — AB (ref <200)
HDL: 81 mg/dL (ref >50)
LDL Cholesterol (Calc): 139 mg/dL (calc) — ABNORMAL HIGH
Non-HDL Cholesterol (Calc): 155 mg/dL (calc) — ABNORMAL HIGH (ref <130)
Total CHOL/HDL Ratio: 2.9 (calc) (ref <5.0)
Triglycerides: 68 mg/dL (ref <150)

## 2017-06-26 LAB — TSH: TSH: 1.01 m[IU]/L (ref 0.40–4.50)

## 2017-06-26 LAB — HEPATIC FUNCTION PANEL
AG RATIO: 1.5 (calc) (ref 1.0–2.5)
ALKALINE PHOSPHATASE (APISO): 85 U/L (ref 33–130)
ALT: 17 U/L (ref 6–29)
AST: 19 U/L (ref 10–35)
Albumin: 4.2 g/dL (ref 3.6–5.1)
BILIRUBIN INDIRECT: 0.4 mg/dL (ref 0.2–1.2)
Bilirubin, Direct: 0 mg/dL (ref 0.0–0.2)
GLOBULIN: 2.8 g/dL (ref 1.9–3.7)
Total Bilirubin: 0.4 mg/dL (ref 0.2–1.2)
Total Protein: 7 g/dL (ref 6.1–8.1)

## 2017-06-26 LAB — HEPATITIS C ANTIBODY
Hepatitis C Ab: NONREACTIVE
SIGNAL TO CUT-OFF: 0.01 (ref <1.00)

## 2017-06-26 LAB — SEDIMENTATION RATE: SED RATE: 17 mm/h (ref 0–30)

## 2017-06-26 LAB — MAGNESIUM: Magnesium: 2 mg/dL (ref 1.5–2.5)

## 2017-07-16 ENCOUNTER — Ambulatory Visit: Payer: Medicare Other | Admitting: Physician Assistant

## 2017-07-16 VITALS — BP 122/78 | HR 68 | Temp 97.5°F | Resp 14 | Ht 64.5 in | Wt 142.0 lb

## 2017-07-16 DIAGNOSIS — R51 Headache: Secondary | ICD-10-CM | POA: Diagnosis not present

## 2017-07-16 DIAGNOSIS — R0989 Other specified symptoms and signs involving the circulatory and respiratory systems: Secondary | ICD-10-CM | POA: Diagnosis not present

## 2017-07-16 DIAGNOSIS — R519 Headache, unspecified: Secondary | ICD-10-CM

## 2017-07-16 MED ORDER — TOPIRAMATE 25 MG PO TABS: 25.0000 mg | ORAL_TABLET | Freq: Every day | ORAL | 2 refills | Status: DC

## 2017-07-16 NOTE — Progress Notes (Signed)
Subjective:    Patient ID: [name], female    DOB: 1951-05-04, 67 y.o.   MRN: [id number]  HPI 67 y.o. AAF presents with HA x Oct, becoming more regular.   She states she has bilateral temple pain and a band around her head, tenderness back of her head. She has some tingling/pins and needles sensation bilateral head. Not worse with brushing her teeth/chewing. She admits to clinching her teeth She states happens 2-3 x a day.  Can last mins to hours. She has sensitivity to light, nothing to sound. Will get nausea mid afternoons x Nov.   She has seen her dentist recently 2-3 weeks ago, had abscess, given augmentin, had tooth extraction/bone graft.  If she is reading or looking down, she has pressure over the bridge of her nose.  No history of migraines/headaches.  She has had some blurry vision, has RX for glasses she needs filled but she has not filled it.  MRI brain 2014  She denies any associated neurological complications or symptoms, such as one-sided weakness, numbness, tingling, slurring of speech, droopy face, swallowing difficulties, diplopia, vision loss, hearing loss or tinnitus.   Blood pressure 122/78, pulse 68, temperature (!) 97.5 F (36.4 C), resp. rate 14, height 5' 4.5" (1.638 m), weight 142 lb (64.4 kg), SpO2 95 %.  Medications Current Outpatient Medications on File Prior to Visit  Medication Sig  . alum & mag hydroxide-simeth (MAALOX/MYLANTA) 200-200-20 MG/5ML suspension Take 30 mLs by mouth every 6 (six) hours as needed for indigestion or heartburn.  . AMOXICILLIN PO Take by mouth.  Marland Kitchen aspirin 81 MG tablet Take 81 mg by mouth daily.  . cetirizine (ZYRTEC) 10 MG tablet Take 10 mg by mouth daily as needed for allergies.  . Cholecalciferol (D-3-5) 5000 UNITS capsule Take 5,000 Units by mouth daily.  . Flaxseed, Linseed, (FLAXSEED OIL PO) Take by mouth daily.  . fluticasone (FLONASE) 50 MCG/ACT nasal spray Place 2 sprays into both nostrils daily.  . Lutein 20 MG  TABS Take by mouth.  . Multiple Vitamins-Minerals (WOMENS MULTI VITAMIN & MINERAL PO) Take 1 tablet by mouth daily.  . Omega-3 Fatty Acids (FISH OIL PO) Take by mouth daily.  . Probiotic Product (ALIGN) 4 MG CAPS Take by mouth daily.  . ranitidine (ZANTAC) 300 MG tablet Take 300 mg by mouth daily as needed for heartburn.  . RESTASIS 0.05 % ophthalmic emulsion instill 1 drop into both eyes twice a day   No current facility-administered medications on file prior to visit.     Problem list She has GERD (gastroesophageal reflux disease); Vitamin D deficiency; Hyperlipidemia; Labile hypertension; Medication management; BMI  23.04,  adult; and Abnormal glucose on their problem list.    Review of Systems  Constitutional: Negative.  Negative for chills and fever.  HENT: Negative for tinnitus.   Eyes: Positive for photophobia. Negative for pain, discharge, redness, itching and visual disturbance.  Respiratory: Negative.   Cardiovascular: Negative.   Gastrointestinal: Negative.   Genitourinary: Negative.   Musculoskeletal: Negative.  Negative for arthralgias, neck pain and neck stiffness.  Neurological: Positive for headaches. Negative for dizziness, tremors, seizures, syncope, facial asymmetry, speech difficulty, weakness, light-headedness and numbness.  Psychiatric/Behavioral: Negative.        Objective:   Physical Exam  Constitutional: She is oriented to person, place, and time. She appears well-developed and well-nourished. No distress.  HENT:  Head: Atraumatic.  Right Ear: External ear normal.  Left Ear: External ear normal.  Nose: Nose  normal.  Mouth/Throat: Oropharynx is clear and moist. No oropharyngeal exudate.  General scalp tenderness to palpation  Eyes: Conjunctivae and EOM are normal. Pupils are equal, round, and reactive to light.  Neck: Normal range of motion. Neck supple.  Cardiovascular: Normal rate, regular rhythm and normal heart sounds.  Pulmonary/Chest: Effort  normal and breath sounds normal. She has no wheezes.  Abdominal: Soft. Bowel sounds are normal. There is no tenderness.  Musculoskeletal: Normal range of motion.  Neurological: She is alert and oriented to person, place, and time. She has normal strength. She displays normal reflexes. No cranial nerve deficit or sensory deficit. Coordination and gait normal.  Skin: She is not diaphoretic.      Assessment & Plan:  [name] was seen today for follow-up and headache.  Diagnoses and all orders for this visit:  Labile hypertension Monitor at home  Nonintractable headache, unspecified chronicity pattern, unspecified headache type DDX: Neuralgia trigeminal Tension-type headache, chronic TMJ syndrome-  Prophylactic therapy: topamax due to high frequency of pain. Side effect profile discussed in detail. Asked to keep headache diary. Patient reassured that neurodiagnostic workup not indicated from benign H&P. Follow up in 5 weeks. -     topiramate (TOPAMAX) 25 MG tablet; Take 1 tablet (25 mg total) by mouth at bedtime.   Will go to the ER if worsening headache, changes vision/speech, imbalance, weakness.

## 2017-07-16 NOTE — Patient Instructions (Addendum)
Aleve, ibuprofen is an antiinflammatory You can take tylenol (500mg ) or tylenol arthritis (650mg ) with the meloxicam/antiinflammatories. The max you can take of tylenol a day is 3000mg  daily, this is a max of 6 pills a day of the regular tyelnol (500mg ) or a max of 4 a day of the tylenol arthritis (650mg ) as long as no other medications you are taking contain tylenol.   this can cause inflammation in your stomach and can cause ulcers or bleeding, this will look like black tarry stools Make sure you taking it with food Try not to take it daily, take AS needed Can take with zantac 150 mg twice a day  WHILE ON THE IBUPROFEN YOU CAN TAKE PRILOSEC OVER THE COUNTER 20 MG IN THE MORNING 30 MINS BEFORE FOOD CAN TAKE WITH THE ZANTAC  STOP THE PRILOSEC WHEN YOU ARE DONE WITH THE IBURPOFRENAND GO BACK TO JUST THE ZANTAC ONCE OFF IT  USE HEATING PAD SOFT FOODS X 3 DAYS  Going to start you on topamax, start on 1/2 pill for 3-5 nights, can increase to a whole pill for 1-2 weeks, and can go up to 50mg  at night (2 pills). Let me know how you are doing with this.    To prevent migraines you can try these natural/OTC medications as prevention: 1) Melatonin 5mg -15mg  30 mins before bed 2) Riboflavin/B2 400mg  daily 3) CoQ10 100mg  3 x a day  We may also treat TMJ if we think you have it If you are having frequent migraines we may put you on a once a day medication with fast acting medication to take. Also there is such a thing called rebound headache from over use of acute medications.  Please do not use rescue or acute medications more than 10 days a month or more than 3 days per week, this can cause a withdrawal and a rebound headache.  Here is more information below  Please remember, common headache triggers are: sleep deprivation, dehydration, overheating, stress, hypoglycemia or skipping meals and blood sugar fluctuations, excessive pain medications or excessive alcohol use or caffeine withdrawal. Some  people have food triggers such as aged cheese, orange juice or chocolate, especially dark chocolate, or MSG (monosodium glutamate). Try to avoid these headache triggers as much possible.   It may be helpful to keep a headache diary to figure out what makes your headaches worse or brings them on and what alleviates them. Some people report headache onset after exercise but studies have shown that regular exercise may actually prevent headaches from coming. If you have exercise-induced headaches, please make sure that you drink plenty of fluid before and after exercising and that you do not over do it and do not overheat.   Please go to the ER if there is weakness, thunderclap headache, visual changes, or any concerning factors    Migraine Headache A migraine headache is an intense, throbbing pain on one or both sides of your head. Recurrent migraines keep coming back. A migraine can last for 30 minutes to several hours. CAUSES  The exact cause of a migraine headache is not always known. However, a migraine may be caused when nerves in the brain become irritated and release chemicals that cause inflammation. This causes pain. Certain things may also trigger migraines, such as:   Alcohol.  Smoking.  Stress.  Menstruation.  Aged cheeses.  Foods or drinks that contain nitrates, glutamate, aspartame, or tyramine.  Lack of sleep.  Chocolate.  Caffeine.  Hunger.  Physical exertion.  Fatigue.  Medicines used to treat chest pain (nitroglycerine), birth control pills, estrogen, and some blood pressure medicines. SYMPTOMS   Pain on one or both sides of your head.  Pulsating or throbbing pain.  Severe pain that prevents daily activities.  Pain that is aggravated by any physical activity.  Nausea, vomiting, or both.  Dizziness.  Pain with exposure to bright lights, loud noises, or activity.  General sensitivity to bright lights, loud noises, or smells. Before you get a  migraine, you may get warning signs that a migraine is coming (aura). An aura may include:  Seeing flashing lights.  Seeing bright spots, halos, or zigzag lines.  Having tunnel vision or blurred vision.  Having feelings of numbness or tingling.  Having trouble talking.  Having muscle weakness. DIAGNOSIS  A recurrent migraine headache is often diagnosed based on:  Symptoms.  Physical examination.  A CT scan or MRI of your head. These imaging tests cannot diagnose migraines but can help rule out other causes of headaches.  TREATMENT  Medicines may be given for pain and nausea. Medicines can also be given to help prevent recurrent migraines. HOME CARE INSTRUCTIONS  Only take over-the-counter or prescription medicines for pain or discomfort as directed by your health care provider. The use of long-term narcotics is not recommended.  Lie down in a dark, quiet room when you have a migraine.  Keep a journal to find out what may trigger your migraine headaches. For example, write down:  What you eat and drink.  How much sleep you get.  Any change to your diet or medicines.  Limit alcohol consumption.  Quit smoking if you smoke.  Get 7-9 hours of sleep, or as recommended by your health care provider.  Limit stress.  Keep lights dim if bright lights bother you and make your migraines worse. SEEK MEDICAL CARE IF:   You do not get relief from the medicines given to you.  You have a recurrence of pain.  You have a fever. SEEK IMMEDIATE MEDICAL CARE IF:  Your migraine becomes severe.  You have a stiff neck.  You have loss of vision.  You have muscular weakness or loss of muscle control.  You start losing your balance or have trouble walking.  You feel faint or pass out. You have severe symptoms that are different from your first symptoms. MAKE SURE YOU:   Understand these instructions.  Will watch your condition.  Will get help right away if you are not  doing well or get worse.   This information is not intended to replace advice given to you by your health care provider. Make sure you discuss any questions you have with your health care provider.   Document Released: 02/25/2001 Document Revised: 06/23/2014 Document Reviewed: 02/07/2013 Elsevier Interactive Patient Education 2016 [employer].  Common Migraine Triggers   Foods Aged cheese, alcohol, nuts, chocolate, yogurt, onions, figs, liver, caffeinated foods and beverages, monosodium glutamate (MSG), smoked or pickled fish/meat, nitrate/nitrate preserved foods (hotdogs, pepperoni, salami) tyramine  Medications Antibiotics (tetracycline, griseofulvin), antihypertensives (nifedipine, captopril), hormones (oral contraceptives, estrogens), histamine-2 blockers (cimetidine, raniidine, vasodilators (nitroglycerine, isosorbide dinitrate)  Sensory Stimuli Flickering/bright/fluorescent lights, bright sunlight, odors (perfume, chemicals, cigarette smoke)  Lifestyle Changes Time zones, sleep patterns, eating habits, caffeine withdrawal stress  Other Menstrual cycle, weather/season/air pressure changes, high altitude  Adapted from [place], [place]. [facility] [place]; [place] and [place]. [facility] Headache, 1998  Hormonal variations also are believed to play a part.  Fluctuations of the  female hormone estrogen (such as just before menstruation) affect a chemical called serotonin-when serotonin levels in the brain fall, the dilation (expansion) of blood vessels in the brain that is characteristic of migraine often follows.  Many factors or "triggers" can start a migraine.  In people who get migraines, most experts think certain activities or foods may trigger temporary changes in the blood vessels around the brain.  Swelling of these blood vessels may cause pain in the nearby nerves.  Allergy Headaches:  Hotdogs Milk  Onions  Thyme Bacon  Chocolate Garlic  Nutmeg Ham  Dark  Cola Pork  Cinnamon Salami  Nuts  Egg  Ginger Sausage Red wine Cloves  Cheddar Cheese Caffeine  What is the TMJ? The temporomandibular (tem-PUH-ro-man-DIB-yoo-ler) joint, or the TMJ, connects the upper and lower jawbones. This joint allows the jaw to open wide and move back and forth when you chew, talk, or yawn.There are also several muscles that help this joint move. There can be muscle tightness and pain in the muscle that can cause several symptoms.  What causes TMJ pain? There are many causes of TMJ pain. Repeated chewing (for example, chewing gum) and clenching your teeth can cause pain in the joint. Some TMJ pain has no obvious cause. What can I do to ease the pain? There are many things you can do to help your pain get better. When you have pain:  Eat soft foods and stay away from chewy foods (for example, taffy) Try to use both sides of your mouth to chew Don't chew gum Massage Don't open your mouth wide (for example, during yawning or singing) Don't bite your cheeks or fingernails Lower your amount of stress and worry Applying a warm, damp washcloth to the joint may help. Over-the-counter pain medicines such as ibuprofen (one brand: Advil) or acetaminophen (one brand: Tylenol) might also help. Do not use these medicines if you are allergic to them or if your doctor told you not to use them. How can I stop the pain from coming back? When your pain is better, you can do these exercises to make your muscles stronger and to keep the pain from coming back:  Resisted mouth opening: Place your thumb or two fingers under your chin and open your mouth slowly, pushing up lightly on your chin with your thumb. Hold for three to six seconds. Close your mouth slowly. Resisted mouth closing: Place your thumbs under your chin and your two index fingers on the ridge between your mouth and the bottom of your chin. Push down lightly on your chin as you close your mouth. Tongue up: Slowly open and  close your mouth while keeping the tongue touching the roof of the mouth. Side-to-side jaw movement: Place an object about one fourth of an inch thick (for example, two tongue depressors) between your front teeth. Slowly move your jaw from side to side. Increase the thickness of the object as the exercise becomes easier Forward jaw movement: Place an object about one fourth of an inch thick between your front teeth and move the bottom jaw forward so that the bottom teeth are in front of the top teeth. Increase the thickness of the object as the exercise becomes easier. These exercises should not be painful. If it hurts to do these exercises, stop doing them and talk to your family doctor.

## 2017-08-03 ENCOUNTER — Ambulatory Visit: Payer: Medicare Other | Admitting: Adult Health

## 2017-08-03 ENCOUNTER — Encounter: Payer: Self-pay | Admitting: Physician Assistant

## 2017-08-03 VITALS — BP 124/76 | HR 64 | Temp 97.6°F | Resp 16 | Ht 64.5 in | Wt 140.8 lb

## 2017-08-03 DIAGNOSIS — M79604 Pain in right leg: Secondary | ICD-10-CM | POA: Diagnosis not present

## 2017-08-03 DIAGNOSIS — R1011 Right upper quadrant pain: Secondary | ICD-10-CM

## 2017-08-03 DIAGNOSIS — M25551 Pain in right hip: Secondary | ICD-10-CM | POA: Diagnosis not present

## 2017-08-03 MED ORDER — PREDNISONE 20 MG PO TABS: ORAL_TABLET | ORAL | 0 refills | Status: DC

## 2017-08-03 MED ORDER — CYCLOBENZAPRINE HCL 10 MG PO TABS: ORAL_TABLET | ORAL | 0 refills | Status: DC

## 2017-08-03 NOTE — Patient Instructions (Signed)
Based on your history and exam, I have a low degree of suspicion for a blood clot, but will check a lab test that will show for sure.    Take 1/2 tab flexeril at night to see how you tolerate prior to taking during the day. Take tylenol or ibuprofen as needed for pain.     Hip Pain The hip is the joint between the upper legs and the lower pelvis. The bones, cartilage, tendons, and muscles of your hip joint support your body and allow you to move around. Hip pain can range from a minor ache to severe pain in one or both of your hips. The pain may be felt on the inside of the hip joint near the groin, or the outside near the buttocks and upper thigh. You may also have swelling or stiffness. Follow these instructions at home: Managing pain, stiffness, and swelling  If directed, apply ice to the injured area. ? Put ice in a plastic bag. ? Place a towel between your skin and the bag. ? Leave the ice on for 20 minutes, 2-3 times a day  Sleep with a pillow between your legs on your most comfortable side.  Avoid any activities that cause pain. General instructions  Take over-the-counter and prescription medicines only as told by your health care provider.  Do any exercises as told by your health care provider.  Record the following: ? How often you have hip pain. ? The location of your pain. ? What the pain feels like. ? What makes the pain worse.  Keep all follow-up visits as told by your health care provider. This is important. Contact a health care provider if:  You cannot put weight on your leg.  Your pain or swelling continues or gets worse after one week.  It gets harder to walk.  You have a fever. Get help right away if:  You fall.  You have a sudden increase in pain and swelling in your hip.  Your hip is red or swollen or very tender to touch. Summary  Hip pain can range from a minor ache to severe pain in one or both of your hips.  The pain may be felt on the  inside of the hip joint near the groin, or the outside near the buttocks and upper thigh.  Avoid any activities that cause pain.  Record how often you have hip pain, the location of the pain, what makes it worse and what it feels like. This information is not intended to replace advice given to you by your health care provider. Make sure you discuss any questions you have with your health care provider. Document Released: 11/20/2009 Document Revised: 05/05/2016 Document Reviewed: 05/05/2016 Elsevier Interactive Patient Education  [employer].

## 2017-08-03 NOTE — Progress Notes (Signed)
Subjective:     [name] is a 66 y.o. female with history of anxiety, HTN, cholesterol who presents for evaluation of pain of the right leg/groin. She is very concerned about a "possible clot." Symptoms have been present for 1 day. She reports this began last night, was constant at that point, sharp, 8/10, could not get comfortable and ended up sleeping upright. She also reports new "twinging"/ sharp brief, 6/10, localized radiating pain to RUQ/R lower chest wall. At time of assessment she reports she is not actively having pain. She has not had similar problems in the past. The patient is able to ambulate. She denies nausea/vomiting/dirrhea/fever/chills. Risk factors for hypercoagulable state include: none known. No personal or family history of clots, not on high risk medications, no recent travel, surgeries or known injury.   Has hx of cardiac cath through the groin of concern in 2011 - is worried this may related.  The following portions of the patient's history were reviewed and updated as appropriate: allergies, current medications, past family history, past medical history, past social history, past surgical history and problem list.  Review of Systems Pertinent items noted in HPI and remainder of comprehensive ROS otherwise negative.    Objective:    BP 124/76   Pulse 64   Temp 97.6 F (36.4 C)   Resp 16   Ht 5' 4.5" (1.638 m)   Wt 140 lb 12.8 oz (63.9 kg)   SpO2 96%   BMI 23.80 kg/m   General Appearance:    Alert, cooperative, no acute distress, appears stated age, appears mildly anxious  Head:    Normocephalic, without obvious abnormality, atraumatic  Eyes:    PERRL, conjunctiva/corneas clear, EOM's intact, both eyes  Ears:    Normal hearing  Nose:     Throat:   Lips, mucosa, and tongue normal; teeth and gums normal  Neck:   Supple, symmetrical, trachea midline, no adenopathy;    thyroid:  no enlargement/tenderness/nodules; no carotid   bruit or JVD  Back:      Symmetric, no curvature, ROM normal  Lungs:     Clear to auscultation bilaterally, respirations unlabored  Chest Wall:    No tenderness or deformity   Heart:    Regular rate and rhythm, S1 and S2 normal, no murmur, rub   or gallop  Breast Exam:    Abdomen:     Soft, non-tender, bowel sounds active all four quadrants,    no masses, no organomegaly  Genitalia:    Rectal:    Extremities:   Extremities normal, atraumatic, no cyanosis or edema  Pulses:   2+ and symmetric all extremities  Skin:   Skin color, texture, turgor normal, no rashes or lesions  Lymph nodes:   Cervical, supraclavicular, and axillary nodes normal  Neurologic:   normal strength, sensation and reflexes    throughout      Assessment:     No  evidence of deep vein thrombosis.  Likely simple strain of R hip.    Plan:    No evidence of DVT.    [name] was seen today for acute visit.  Diagnoses and all orders for this visit:  RUQ abdominal pain -     CBC with Differential/Platelet -     BASIC METABOLIC PANEL WITH GFR -     Hepatic function panel  Pain of right lower extremity Reassured, low suspicion for clot. Will draw d-dimer due to high degree of anxiety related to this.  -  D-dimer, quantitative (not at [facility])  Right hip pain Likely simple strain; recommended she take some tylenol/ibuproen, can try prednisone, expect to resolve within 1-2 weeks.  -     predniSONE (DELTASONE) 20 MG tablet; 2 tablets daily for 3 days, 1 tablet daily for 4 days. -     cyclobenzaprine (FLEXERIL) 10 MG tablet; Take 1/2-1 tab up to every 8 hours as needed for muscle spasms. May cause drowsiness, take at night only if makes you too sleepy.   Future Appointments  Date Time Provider [place]  08/17/2017  4:30 PM Vicie Mutters, PA-C [facility] None  01/28/2018  9:00 AM Unk Pinto, MD [facility] None

## 2017-08-03 NOTE — Progress Notes (Deleted)
   Subjective:    Patient ID: [name], female    DOB: 04/29/1951, 67 y.o.   MRN: [id number]  HPI 67 y.o. AAF with history of anxiety, HTN, chol presents with leg pain.  She is low risk for DVT/PE, no family history of DVT/PE, she is not on estrogen, she denies DVT/PE {Causes; risk factors [id number]:11319}  There were no vitals taken for this visit.  Medications Current Outpatient Medications on File Prior to Visit  Medication Sig  . alum & mag hydroxide-simeth (MAALOX/MYLANTA) 200-200-20 MG/5ML suspension Take 30 mLs by mouth every 6 (six) hours as needed for indigestion or heartburn.  . AMOXICILLIN PO Take by mouth.  Marland Kitchen aspirin 81 MG tablet Take 81 mg by mouth daily.  . cetirizine (ZYRTEC) 10 MG tablet Take 10 mg by mouth daily as needed for allergies.  . Cholecalciferol (D-3-5) 5000 UNITS capsule Take 5,000 Units by mouth daily.  . Flaxseed, Linseed, (FLAXSEED OIL PO) Take by mouth daily.  . fluticasone (FLONASE) 50 MCG/ACT nasal spray Place 2 sprays into both nostrils daily.  . Lutein 20 MG TABS Take by mouth.  . Multiple Vitamins-Minerals (WOMENS MULTI VITAMIN & MINERAL PO) Take 1 tablet by mouth daily.  . Omega-3 Fatty Acids (FISH OIL PO) Take by mouth daily.  . Probiotic Product (ALIGN) 4 MG CAPS Take by mouth daily.  . ranitidine (ZANTAC) 300 MG tablet Take 300 mg by mouth daily as needed for heartburn.  . RESTASIS 0.05 % ophthalmic emulsion instill 1 drop into both eyes twice a day  . topiramate (TOPAMAX) 25 MG tablet Take 1 tablet (25 mg total) by mouth at bedtime.   No current facility-administered medications on file prior to visit.     Problem list She has GERD (gastroesophageal reflux disease); Vitamin D deficiency; Hyperlipidemia; Labile hypertension; Medication management; BMI  23.04,  adult; and Abnormal glucose on their problem list.  family history includes Cancer in her maternal grandmother; Diabetes in her father.  Review of Systems     Objective:   Physical  Exam        Assessment & Plan:

## 2017-08-04 LAB — HEPATIC FUNCTION PANEL
AG Ratio: 1.3 (calc) (ref 1.0–2.5)
ALKALINE PHOSPHATASE (APISO): 85 U/L (ref 33–130)
ALT: 24 U/L (ref 6–29)
AST: 27 U/L (ref 10–35)
Albumin: 3.9 g/dL (ref 3.6–5.1)
BILIRUBIN DIRECT: 0.1 mg/dL (ref 0.0–0.2)
BILIRUBIN INDIRECT: 0.3 mg/dL (ref 0.2–1.2)
Globulin: 3 g/dL (calc) (ref 1.9–3.7)
Total Bilirubin: 0.4 mg/dL (ref 0.2–1.2)
Total Protein: 6.9 g/dL (ref 6.1–8.1)

## 2017-08-04 LAB — BASIC METABOLIC PANEL WITH GFR
BUN: 17 mg/dL (ref 7–25)
CALCIUM: 9.7 mg/dL (ref 8.6–10.4)
CHLORIDE: 104 mmol/L (ref 98–110)
CO2: 28 mmol/L (ref 20–32)
Creat: 0.91 mg/dL (ref 0.50–0.99)
GFR, EST AFRICAN AMERICAN: 77 mL/min/{1.73_m2} (ref >=60)
GFR, Est Non African American: 66 mL/min/{1.73_m2} (ref >=60)
Glucose, Bld: 86 mg/dL (ref 65–99)
POTASSIUM: 4.3 mmol/L (ref 3.5–5.3)
SODIUM: 140 mmol/L (ref 135–146)

## 2017-08-04 LAB — CBC WITH DIFFERENTIAL/PLATELET
BASOS PCT: 0.9 %
Basophils Absolute: 41 cells/uL (ref 0–200)
Eosinophils Absolute: 101 cells/uL (ref 15–500)
Eosinophils Relative: 2.2 %
HCT: 38.3 % (ref 35.0–45.0)
Hemoglobin: 12.9 g/dL (ref 11.7–15.5)
Lymphs Abs: 2305 cells/uL (ref 850–3900)
MCH: 28.4 pg (ref 27.0–33.0)
MCHC: 33.7 g/dL (ref 32.0–36.0)
MCV: 84.4 fL (ref 80.0–100.0)
MONOS PCT: 8.3 %
MPV: 12.1 fL (ref 7.5–12.5)
Neutro Abs: 1771 cells/uL (ref 1500–7800)
Neutrophils Relative %: 38.5 %
PLATELETS: 178 10*3/uL (ref 140–400)
RBC: 4.54 10*6/uL (ref 3.80–5.10)
RDW: 12.5 % (ref 11.0–15.0)
TOTAL LYMPHOCYTE: 50.1 %
WBC mixed population: 382 cells/uL (ref 200–950)
WBC: 4.6 10*3/uL (ref 3.8–10.8)

## 2017-08-04 LAB — D-DIMER, QUANTITATIVE (NOT AT ARMC): D DIMER QUANT: 0.23 ug{FEU}/mL (ref <0.50)

## 2017-08-05 LAB — HM DEXA SCAN

## 2017-08-07 ENCOUNTER — Other Ambulatory Visit: Payer: Self-pay | Admitting: Gastroenterology

## 2017-08-07 DIAGNOSIS — R935 Abnormal findings on diagnostic imaging of other abdominal regions, including retroperitoneum: Secondary | ICD-10-CM

## 2017-08-11 ENCOUNTER — Encounter: Payer: Self-pay | Admitting: Physician Assistant

## 2017-08-11 DIAGNOSIS — M810 Age-related osteoporosis without current pathological fracture: Secondary | ICD-10-CM | POA: Insufficient documentation

## 2017-08-12 ENCOUNTER — Encounter: Payer: Self-pay | Admitting: Gynecology

## 2017-08-12 ENCOUNTER — Encounter: Payer: Self-pay | Admitting: Internal Medicine

## 2017-08-12 ENCOUNTER — Ambulatory Visit: Payer: Medicare Other | Admitting: Gynecology

## 2017-08-12 VITALS — BP 110/66

## 2017-08-12 DIAGNOSIS — R350 Frequency of micturition: Secondary | ICD-10-CM

## 2017-08-12 DIAGNOSIS — N898 Other specified noninflammatory disorders of vagina: Secondary | ICD-10-CM | POA: Diagnosis not present

## 2017-08-12 LAB — WET PREP FOR TRICH, YEAST, CLUE

## 2017-08-12 MED ORDER — CLINDAMYCIN PHOSPHATE 2 % VA CREA: 1.0000 | TOPICAL_CREAM | Freq: Every day | VAGINAL | 0 refills | Status: DC

## 2017-08-12 NOTE — Progress Notes (Signed)
    [name] [birth year] [id number]        67 y.o.  G0P0000 presents with a 2-3-week history of heavier yellow discharge.  No irritation, odor or itching.  Notes some urinary frequency but feels like she always has this.  No change.  No urgency, dysuria, low back pain, fever or chills.  Past medical history,surgical history, problem list, medications, allergies, family history and social history were all reviewed and documented in the EPIC chart.  Directed ROS with pertinent positives and negatives documented in the history of present illness/assessment and plan.  Exam: [name] assistant Vitals:   08/12/17 0944  BP: 110/66   General appearance:  Normal Abdomen soft nontender without masses guarding rebound Pelvic external BUS vagina with atrophic changes.  Slight white to yellowish discharge.  Cervix with atrophic changes.  Uterus normal size midline mobile nontender.  Adnexa without masses or tenderness.  Assessment/Plan:  67 y.o. G0P0000 with 2-3 weeks of vaginal discharge without other symptoms.  Wet prep is unremarkable.  Patient does note that she was on steroids and an antibiotic infection and I wonder whether she does not have a bacterial overgrowth leading to her symptoms.  Will cover as bacterial vaginosis low-grade.  Options reviewed and patient elects for Cleocin vaginal cream nightly x7 nights.  Her urine analysis is negative and I feel her frequency is her baseline without other signs of UTI.    Anastasio Auerbach MD, 9:53 AM 08/12/2017

## 2017-08-12 NOTE — Patient Instructions (Signed)
Use the vaginal cream nightly for 7 nights.  Follow-up if the vaginal discharge persists or other symptoms occur.

## 2017-08-13 LAB — URINALYSIS, COMPLETE W/RFL CULTURE
Bacteria, UA: NONE SEEN /HPF
Bilirubin Urine: NEGATIVE
GLUCOSE, UA: NEGATIVE
HGB URINE DIPSTICK: NEGATIVE
Hyaline Cast: NONE SEEN /LPF
KETONES UR: NEGATIVE
Leukocyte Esterase: NEGATIVE
Nitrites, Initial: NEGATIVE
PROTEIN: NEGATIVE
RBC / HPF: NONE SEEN /HPF (ref 0–2)
Specific Gravity, Urine: 1.002 (ref 1.001–1.03)
WBC UA: NONE SEEN /HPF (ref 0–5)
pH: 6 (ref 5.0–8.0)

## 2017-08-13 LAB — NO CULTURE INDICATED

## 2017-08-13 NOTE — Progress Notes (Signed)
Assessment and Plan:  Labile hypertension - continue medications, DASH diet, exercise and monitor at home. Call if greater than 130/80.  SOME HYPOTENSION TODAY, NOT DRINKING ENOUGH WATER PER PATIENT WILL CHECK LABS INCLUDING URINE -     CBC with Differential/Platelet -     BASIC METABOLIC PANEL WITH GFR -     Urinalysis, Routine w reflex microscopic -     Microalbumin / creatinine urine ratio -     TSH  Urinary frequency -     Urine Culture  Right hip pain LIKELY ADDUCTOR STRAIN- INFORMATION GIVEN  Osteoporosis, unspecified osteoporosis type, unspecified pathological fracture presence WILL INCREASE VITAMIN D, WEIGHT BEARING EXERCISES.   Headache Try the topamax low dose  Close follow up 4-6 weeks.  Future Appointments  Date Time Provider [place]  08/21/2017  8:45 AM [facility] [place] [facility]  01/28/2018  9:00 AM Unk Pinto, MD [facility] None    HPI 67 y.o.female presents for 1 month follow up for HA.  Also recently seen for R groin pain, thought to be strain and treated appropriately. She did not take the flexeril or the prednisone. She has some discomfort but it has improved. She is seeing a Restaurant manager, fast food.  She did not start topamax or flexeril or prednisone.  She continues to have headache, has several times a week.  Patient has high degree of anxiety, not treated.   She will get dizzy some, she has had 40 oz today, no diarrhea, she has had some burning with urination.   She did have osteoporosis on her DEXA. She will increase vitamin D  Lab Results  Component Value Date   VD25OH 56 01/15/2017    Blood pressure 100/60, pulse 76, temperature 97.7 F (36.5 C), height 5' 4.5" (1.638 m), weight 147 lb (66.7 kg), SpO2 99 %.   Past Medical History:  Diagnosis Date  . Arthritis    cervical and lumbar  . Depression   . GERD (gastroesophageal reflux disease)   . Hyperlipidemia   . IBS (irritable bowel syndrome)   . Prediabetes   . Vitamin D  deficiency      Allergies  Allergen Reactions  . Bactrim [Sulfamethoxazole-Trimethoprim] Other (See Comments)    Patient preference to take medication without sulfa    Current Outpatient Medications on File Prior to Visit  Medication Sig  . alum & mag hydroxide-simeth (MAALOX/MYLANTA) 200-200-20 MG/5ML suspension Take 30 mLs by mouth every 6 (six) hours as needed for indigestion or heartburn.  Marland Kitchen aspirin 81 MG tablet Take 81 mg by mouth daily.  . cetirizine (ZYRTEC) 10 MG tablet Take 10 mg by mouth daily as needed for allergies.  . Cholecalciferol (D-3-5) 5000 UNITS capsule Take 5,000 Units by mouth daily.  . clindamycin (CLEOCIN) 2 % vaginal cream Place 1 Applicatorful vaginally at bedtime.  . Flaxseed, Linseed, (FLAXSEED OIL PO) Take by mouth daily.  . fluticasone (FLONASE) 50 MCG/ACT nasal spray Place 2 sprays into both nostrils daily.  . Lutein 20 MG TABS Take by mouth.  . Multiple Vitamins-Minerals (WOMENS MULTI VITAMIN & MINERAL PO) Take 1 tablet by mouth daily.  . Omega-3 Fatty Acids (FISH OIL PO) Take by mouth daily.  . Probiotic Product (ALIGN) 4 MG CAPS Take by mouth daily.  . ranitidine (ZANTAC) 300 MG tablet Take 300 mg by mouth daily as needed for heartburn.  . RESTASIS 0.05 % ophthalmic emulsion instill 1 drop into both eyes twice a day  . cyclobenzaprine (FLEXERIL) 10 MG tablet Take  1/2-1 tab up to every 8 hours as needed for muscle spasms. May cause drowsiness, take at night only if makes you too sleepy. (Patient not taking: Reported on 08/17/2017)  . topiramate (TOPAMAX) 25 MG tablet Take 1 tablet (25 mg total) by mouth at bedtime. (Patient not taking: Reported on 08/17/2017)   No current facility-administered medications on file prior to visit.     ROS: all negative except above.   Physical Exam: Filed Weights   08/17/17 1644  Weight: 147 lb (66.7 kg)   BP 100/60   Pulse 76   Temp 97.7 F (36.5 C)   Ht 5' 4.5" (1.638 m)   Wt 147 lb (66.7 kg)   SpO2 99%   BMI  24.84 kg/m  General Appearance: Well nourished, in no apparent distress. Eyes: PERRLA, EOMs, conjunctiva no swelling or erythema Sinuses: No Frontal/maxillary tenderness ENT/Mouth: Ext aud canals clear, TMs without erythema, bulging. No erythema, swelling, or exudate on post pharynx.  Tonsils not swollen or erythematous. Hearing normal.  Neck: Supple, thyroid normal.  Respiratory: Respiratory effort normal, BS equal bilaterally without rales, rhonchi, wheezing or stridor.  Cardio: RRR with no MRGs. Brisk peripheral pulses without edema.  Abdomen: Soft, + BS.  Non tender, no guarding, rebound, hernias, masses. Lymphatics: Non tender without lymphadenopathy.  Musculoskeletal: Full ROM, 5/5 strength, normal gait. + tenderness with right adductor.  Skin: Warm, dry without rashes, lesions, ecchymosis.  Neuro: Cranial nerves intact. Normal muscle tone, no cerebellar symptoms. Sensation intact.  Psych: Awake and oriented X 3, normal affect, Insight and Judgment appropriate.     Vicie Mutters, PA-C 5:43 PM [facility]

## 2017-08-17 ENCOUNTER — Ambulatory Visit: Payer: Medicare Other | Admitting: Physician Assistant

## 2017-08-17 ENCOUNTER — Encounter: Payer: Self-pay | Admitting: Physician Assistant

## 2017-08-17 VITALS — BP 100/60 | HR 76 | Temp 97.7°F | Ht 64.5 in | Wt 147.0 lb

## 2017-08-17 DIAGNOSIS — M25551 Pain in right hip: Secondary | ICD-10-CM | POA: Diagnosis not present

## 2017-08-17 DIAGNOSIS — R350 Frequency of micturition: Secondary | ICD-10-CM | POA: Diagnosis not present

## 2017-08-17 DIAGNOSIS — R0989 Other specified symptoms and signs involving the circulatory and respiratory systems: Secondary | ICD-10-CM

## 2017-08-17 DIAGNOSIS — M810 Age-related osteoporosis without current pathological fracture: Secondary | ICD-10-CM | POA: Diagnosis not present

## 2017-08-17 NOTE — Patient Instructions (Addendum)
Increase by 10,000 a week of your vitamin D  TRY THE TOPAMAX 25MG  START 1/2 AT NIGHT, FOR 1-2 WEEKS, THEN GO UP TO 1 PILL AT NIGHT AND SEE HOW YOU DO  Being dehydrated can hurt your kidneys, cause fatigue, headaches, muscle aches, joint pain, and dry skin/nails so please increase your fluids.   Drink 80-100 oz a day of water, measure it out!  Can check out plantnanny app on your phone to help you keep track of your water  Breaking a bone can be serious, especially if the bone is in the hip. People who break a hip sometimes lose the ability to walk on their own. Many of them end up in a nursing home. That's why it is so important to avoid breaking a bone in the first place.   What can I do to keep my bones as healthy as possible? - You can: ?Eat foods with a lot of calcium, such as milk, yogurt, and green leafy vegetables  ?Eat foods with a lot of vitamin D, such as milk that has vitamin D added, and fish from the ocean  ?Take vitamin D pills (if you do not get enough from the food that you eat) ?Be active for at least 30 minutes, most days of the week ?Avoid smoking  ?Limit the amount of alcohol you drink to 1 to 2 drinks a day at most  What do osteoporosis medicines do? - If you have osteoporosis or a high risk of breaking a bone, the medicines your doctor prescribes can: ?Reduce bone loss  ?Increase bone density or keep it about the same ?Reduce the chances that you will break a bone   Bisphosphonates - Most people being treated for osteoporosis take these medicines first. If they do not work well enough or cause side effects that are too hard to handle, there are other options.   Most people take one pill every week. If your doctor prescribes a bisphosphonate pill, you must take the medicine exactly as directed. If you don't, the medicine can irritate your throat or stomach. For most bisphosphonate pills, you must:  ?Take the pill first thing in the morning, before you have anything to  eat or drink.  ?Drink an 8-ounce glass of water with the pill, but not eat or drink anything else for 30 minutes or 1 hour (depending on which pill you take).  ?Avoid lying down for 30 minutes after taking the pill. (You must sit or stand during that time).   Osteoporosis Osteoporosis is the thinning and loss of density in the bones. Osteoporosis makes the bones more brittle, fragile, and likely to break (fracture). Over time, osteoporosis can cause the bones to become so weak that they fracture after a simple fall. The bones most likely to fracture are the bones in the hip, wrist, and spine. What are the causes? The exact cause is not known. What increases the risk? Anyone can develop osteoporosis. You may be at greater risk if you have a family history of the condition or have poor nutrition. You may also have a higher risk if you are:  Female.  60 years old or older.  A smoker.  Not physically active.  White or Asian.  Slender.  What are the signs or symptoms? A fracture might be the first sign of the disease, especially if it results from a fall or injury that would not usually cause a bone to break. Other signs and symptoms include:  Low back and  neck pain.  Stooped posture.  Height loss.  How is this diagnosed? To make a diagnosis, your health care provider may:  Take a medical history.  Perform a physical exam.  Order tests, such as: ? A bone mineral density test. ? A dual-energy X-ray absorptiometry test.  How is this treated? The goal of osteoporosis treatment is to strengthen your bones to reduce your risk of a fracture. Treatment may involve:  Making lifestyle changes, such as: ? Eating a diet rich in calcium. ? Doing weight-bearing and muscle-strengthening exercises. ? Stopping tobacco use. ? Limiting alcohol intake.  Taking medicine to slow the process of bone loss or to increase bone density.  Monitoring your levels of calcium and vitamin  D.  Follow these instructions at home:  Include calcium and vitamin D in your diet. Calcium is important for bone health, and vitamin D helps the body absorb calcium.  Perform weight-bearing and muscle-strengthening exercises as directed by your health care provider.  Do not use any tobacco products, including cigarettes, chewing tobacco, and electronic cigarettes. If you need help quitting, ask your health care provider.  Limit your alcohol intake.  Take medicines only as directed by your health care provider.  Keep all follow-up visits as directed by your health care provider. This is important.  Take precautions at home to lower your risk of falling, such as: ? Keeping rooms well lit and clutter free. ? Installing safety rails on stairs. ? Using rubber mats in the bathroom and other areas that are often wet or slippery. Get help right away if: You fall or injure yourself. This information is not intended to replace advice given to you by your health care provider. Make sure you discuss any questions you have with your health care provider. Document Released: 03/12/2005 Document Revised: 11/05/2015 Document Reviewed: 11/10/2013 Elsevier Interactive Patient Education  2018 [place].   Adductor Muscle Strain An adductor muscle strain, also called a groin strain or pull, is an injury to the muscles or tendon on the upper inner part of the thigh. These muscles are called the adductor muscles or groin muscles. They are responsible for moving the leg across the body or pulling the legs together. A muscle strain occurs when a muscle is overstretched and some muscle fibers are torn. An adductor muscle strain can range from mild to severe, depending on how many muscle fibers are affected and whether the muscle fibers are partially or completely torn. Adductor muscle strains usually occur during exercise or participation in sports. The injury often happens when a sudden, violent force is  placed on a muscle, stretching the muscle too far. A strain is more likely to occur when your muscles are not warmed up or if you are not properly conditioned. Depending on the severity of the muscle strain, recovery time may vary from a few weeks to several weeks. Severe injuries often require 4-6 weeks for recovery. In those cases, complete healing can take 4-5 months. What are the causes? This injury may be caused by:  Stretching the adductor muscles too far or too suddenly, often during side-to-side motion with an abrupt change in direction.  Putting repeated stress on the adductor muscles over a long period of time.  Performing vigorous activity without properly stretching the adductor muscles beforehand.  What are the signs or symptoms? Symptoms of this condition include:  Pain and tenderness in the groin area. This begins as sharp pain and persists as a dull ache.  A popping or  snapping feeling when the injury occurs (for severe strains).  Swelling or bruising.  Muscle spasms.  Weakness in the leg.  Stiffness in the groin area with decreased ability to move the affected muscles.  How is this diagnosed? This condition may be diagnosed based on your symptoms, your description of how the injury occurred, and a physical exam. X-rays are sometimes needed to rule out a broken bone or cartilage problems. A CT scan or MRI may be done if your health care provider suspects a complete muscle tear or needs to check for other injuries. How is this treated? An adductor strain will often heal on its own. Typically, treatment will involve protecting the injured area, rest, ice, pressure (compression), and elevation. This is often called PRICE therapy. Your health care provider may also prescribe medicines to help manage pain and swelling (anti-inflammatory medicine). You may be told to use crutches for the first few days to minimize your pain. Follow these instructions at home: [place] the muscle from being injured again.  Rest. Do not use the strained muscle if it causes pain.  If directed, apply ice to the injured area: ? Put ice in a plastic bag. ? Place a towel between your skin and the bag. ? Leave the ice on for 20 minutes, 2-3 times a day. Do this for the first 2 days after the injury.  Apply compression by wrapping the injured area with an elastic bandage as told by your health care provider.  Raise (elevate) the injured area above the level of your heart while you are sitting or lying down. General instructions  Take over-the-counter and prescription medicines only as told by your health care provider.  Walk, stretch, and do exercises as told by your health care provider. Only do these activities if you can do so without any pain. How is this prevented?  Warm up and stretch before being active.  Cool down and stretch after being active.  Give your body time to rest between periods of activity.  Make sure to use equipment that fits you.  Be safe and responsible while being active to avoid slips and falls.  Maintain physical fitness, including: ? Proper conditioning in the adductor muscles. ? Overall strength, flexibility, and endurance. Contact a health care provider if:  You have increased pain or swelling in the affected area.  Your symptoms are not improving or they are getting worse. This information is not intended to replace advice given to you by your health care provider. Make sure you discuss any questions you have with your health care provider. Document Released: 01/29/2004 Document Revised: 12/21/2015 Document Reviewed: 03/06/2015 Elsevier Interactive Patient Education  2018 [place].   Adductor Muscle Strain With Rehab Ask your health care provider which exercises are safe for you. Do exercises exactly as told by your health care provider and adjust them as directed. It is normal to feel mild stretching,  pulling, tightness, or discomfort as you do these exercises, but you should stop right away if you feel sudden pain or your pain gets worse.Do not begin these exercises until told by your health care provider. Strengthening exercises These exercises build strength and endurance in your thigh. Endurance is the ability to use your muscles for a long time, even after your muscles get tired. Exercise A: Hip adductor isometrics  1. Sit on a firm chair that positions your knees at about the same height as your hips. 2. Place a large ball, firm pillow,  or rolled-up bath towel between your thighs. 3. Squeeze your thighs together, gradually building tension. 4. Hold for __________ seconds. 5. Release the tension gradually. Allow your inner thigh muscles to relax completely before you start the next repetition. Repeat __________ times. Complete this exercise __________ times a day. Exercise B: Straight leg raises ( hip adductors) 1. Lie on your side so your head, shoulder, knee, and hip are in a straight line with each other. To help maintain your balance, you may put the foot of your top leg in front of the leg that is on the floor. Your left / right leg should be on the bottom. 2. Roll your hips slightly forward so your hips are stacked directly over each other and your left / right knee is facing forward. 3. Tense the muscles of your inner thigh and lift your bottom leg 4-6 inches (10-15 cm). 4. Hold this position for __________ seconds. 5. Slowly lower your leg to the starting position. 6. Allow the muscles to relax completely before you start the next repetition. Repeat __________ times. Complete this exercise __________ times a day. Exercise C: Straight leg raises ( hip extensors) 1. Lie on your belly on a bed or a firm surface with a pillow under your hips. 2. Tense your buttock muscles and lift your left / right thigh off the bed. Your left / right knee can be bent or straight, but do not let  your back arch. 3. Hold this position for __________ seconds. 4. Slowly return to the starting position. 5. Allow your muscles to relax completely before you start the next repetition. Repeat __________ times. Complete this exercise __________ times a day. Balance exercises These exercises improve or maintain your balance. Balance is important in preventing falls. Exercise D: Single-leg balance 1. Stand near a railing or in a door frame that you can hold onto as needed. 2. Stand on your left / right foot. Keep your big toe down on the floor and try to keep your arch lifted. 3. If this is too easy, you can stand with your eyes closed or stand on a pillow. 4. Hold this position for __________ seconds. Repeat __________ times. Complete this exercise __________ times a day. Exercise E: Side lunges 1. Stand with your feet together. 2. Keeping one foot in place, step to the side with your other foot about __________ inches (__________ cm). Do not step so far that you feel discomfort. 3. Push off from your stepping foot to return to the starting position. Repeat __________ times on each leg. Complete this exercise __________ times a day. This information is not intended to replace advice given to you by your health care provider. Make sure you discuss any questions you have with your health care provider. Document Released: 06/02/2005 Document Revised: 02/04/2016 Document Reviewed: 03/06/2015 Elsevier Interactive Patient Education  2018 [place].   CAN TAKE PREDNISONE IF YOU FEEL THAT YOU WANT TO FOR YOU HIP DO NOT HAVE TO [place] Please take the prednisone as directed below, this is NOT an antibiotic so you do NOT have to finish it. You can take it for a few days and stop it if you are doing better.   Please take the prednisone to help decrease inflammation and therefore decrease symptoms. Take it it with food to avoid GI upset. It can cause increased energy but on the other hand it can make  it hard to sleep at night so please take it AT [place], it takes  8-12 hours to start working so it will NOT affect your sleeping if you take it at night with your food!!  If you are diabetic it will increase your sugars so decrease carbs and monitor your sugars closely.     Please monitor your blood pressure. If it is getting below 130/80 AND you are having fatigue with exertion, dizziness we may need to cut your blood pressure medication in half. Please call the office if this is happening. Hypotension As your heart beats, it forces blood through your body. This force is called blood pressure. If you have hypotension, you have low blood pressure. When your blood pressure is too low, you may not get enough blood to your brain. You may feel weak, feel lightheaded, have a fast heartbeat, or even pass out (faint). HOME CARE  Drink enough fluids to keep your pee (urine) clear or pale yellow.  Take all medicines as told by your doctor.  Get up slowly after sitting or lying down.  Wear support stockings as told by your doctor.  Maintain a healthy diet by including foods such as fruits, vegetables, nuts, whole grains, and lean meats. GET HELP IF:  You are throwing up (vomiting) or have watery poop (diarrhea).  You have a fever for more than 2-3 days.  You feel more thirsty than usual.  You feel weak and tired. GET HELP RIGHT AWAY IF:   You pass out (faint).  You have chest pain or a fast or irregular heartbeat.  You lose feeling in part of your body.  You cannot move your arms or legs.  You have trouble speaking.  You get sweaty or feel lightheaded. MAKE SURE YOU:   Understand these instructions.  Will watch your condition.  Will get help right away if you are not doing well or get worse. Document Released: 08/27/2009 Document Revised: 02/02/2013 Document Reviewed: 12/03/2012 [facility] Patient Information 2015 [place], [place]. This information is not intended to  replace advice given to you by your health care provider. Make sure you discuss any questions you have with your health care provider.

## 2017-08-18 LAB — BASIC METABOLIC PANEL WITH GFR
BUN: 15 mg/dL (ref 7–25)
CALCIUM: 9.6 mg/dL (ref 8.6–10.4)
CO2: 29 mmol/L (ref 20–32)
Chloride: 104 mmol/L (ref 98–110)
Creat: 0.78 mg/dL (ref 0.50–0.99)
GFR, EST AFRICAN AMERICAN: 92 mL/min/{1.73_m2} (ref >=60)
GFR, EST NON AFRICAN AMERICAN: 80 mL/min/{1.73_m2} (ref >=60)
Glucose, Bld: 87 mg/dL (ref 65–99)
POTASSIUM: 4.2 mmol/L (ref 3.5–5.3)
Sodium: 140 mmol/L (ref 135–146)

## 2017-08-18 LAB — CBC WITH DIFFERENTIAL/PLATELET
BASOS ABS: 32 {cells}/uL (ref 0–200)
BASOS PCT: 0.8 %
EOS ABS: 100 {cells}/uL (ref 15–500)
Eosinophils Relative: 2.5 %
HCT: 39.1 % (ref 35.0–45.0)
Hemoglobin: 13.4 g/dL (ref 11.7–15.5)
Lymphs Abs: 2264 cells/uL (ref 850–3900)
MCH: 28.9 pg (ref 27.0–33.0)
MCHC: 34.3 g/dL (ref 32.0–36.0)
MCV: 84.4 fL (ref 80.0–100.0)
MONOS PCT: 8.8 %
MPV: 11.6 fL (ref 7.5–12.5)
Neutro Abs: 1252 cells/uL — ABNORMAL LOW (ref 1500–7800)
Neutrophils Relative %: 31.3 %
PLATELETS: 181 10*3/uL (ref 140–400)
RBC: 4.63 10*6/uL (ref 3.80–5.10)
RDW: 12.4 % (ref 11.0–15.0)
TOTAL LYMPHOCYTE: 56.6 %
WBC: 4 10*3/uL (ref 3.8–10.8)
WBCMIX: 352 {cells}/uL (ref 200–950)

## 2017-08-18 LAB — URINALYSIS, ROUTINE W REFLEX MICROSCOPIC
BACTERIA UA: NONE SEEN /HPF
Bilirubin Urine: NEGATIVE
Glucose, UA: NEGATIVE
HGB URINE DIPSTICK: NEGATIVE
Hyaline Cast: NONE SEEN /LPF
KETONES UR: NEGATIVE
Nitrite: NEGATIVE
Protein, ur: NEGATIVE
RBC / HPF: NONE SEEN /HPF (ref 0–2)
SPECIFIC GRAVITY, URINE: 1.005 (ref 1.001–1.03)
SQUAMOUS EPITHELIAL / LPF: NONE SEEN /HPF (ref <=5)
WBC UA: NONE SEEN /HPF (ref 0–5)
pH: 7 (ref 5.0–8.0)

## 2017-08-18 LAB — URINE CULTURE
MICRO NUMBER:: 90276945
Result:: NO GROWTH
SPECIMEN QUALITY:: ADEQUATE

## 2017-08-18 LAB — TSH: TSH: 1.07 m[IU]/L (ref 0.40–4.50)

## 2017-08-18 LAB — MICROALBUMIN / CREATININE URINE RATIO
Creatinine, Urine: 20 mg/dL (ref 20–275)
Microalb, Ur: <0.2 mg/dL

## 2017-08-21 ENCOUNTER — Ambulatory Visit
Admission: RE | Admit: 2017-08-21 | Discharge: 2017-08-21 | Disposition: A | Discharge status: Home / Self Care | Payer: Medicare Other | Source: Ambulatory Visit | Attending: Gastroenterology | Admitting: Gastroenterology

## 2017-08-21 DIAGNOSIS — R935 Abnormal findings on diagnostic imaging of other abdominal regions, including retroperitoneum: Secondary | ICD-10-CM

## 2017-08-31 ENCOUNTER — Telehealth: Payer: Self-pay | Admitting: *Deleted

## 2017-08-31 NOTE — Telephone Encounter (Signed)
Pt was prescribed cleocin 2% on OV 08/12/17 pt never used cream started the cream, pt read side effects and would prefer pill form. Please advise

## 2017-09-01 MED ORDER — METRONIDAZOLE 500 MG PO TABS: 500.0000 mg | ORAL_TABLET | Freq: Two times a day (BID) | ORAL | 0 refills | Status: DC

## 2017-09-01 NOTE — Telephone Encounter (Signed)
Pt aware, Rx sent. 

## 2017-09-01 NOTE — Telephone Encounter (Signed)
Not aware of there being significant side effects to using the Cleocin vaginal cream regardless she is welcome to Flagyl 500 mg twice daily times 7 days.  She should avoid alcohol while taking.

## 2017-10-19 ENCOUNTER — Encounter: Payer: Self-pay | Admitting: Physician Assistant

## 2017-10-19 ENCOUNTER — Ambulatory Visit: Payer: Medicare Other | Admitting: Physician Assistant

## 2017-10-19 VITALS — BP 124/72 | HR 66 | Temp 97.5°F | Resp 16 | Ht 64.5 in | Wt 143.6 lb

## 2017-10-19 DIAGNOSIS — E2839 Other primary ovarian failure: Secondary | ICD-10-CM

## 2017-10-19 DIAGNOSIS — R51 Headache: Secondary | ICD-10-CM | POA: Diagnosis not present

## 2017-10-19 DIAGNOSIS — R519 Headache, unspecified: Secondary | ICD-10-CM

## 2017-10-19 DIAGNOSIS — R350 Frequency of micturition: Secondary | ICD-10-CM | POA: Diagnosis not present

## 2017-10-19 MED ORDER — ESTROGENS, CONJUGATED 0.625 MG/GM VA CREA: 1.0000 | TOPICAL_CREAM | Freq: Every day | VAGINAL | 12 refills | Status: DC

## 2017-10-19 NOTE — Progress Notes (Signed)
Assessment and Plan:  Urinary frequency -     Urinalysis, Routine w reflex microscopic -     Urine Culture - likely her baseline VERSUS vaginal atrophy, rule out infection and treat vaginal atrophy  Nonintractable headache, unspecified chronicity pattern, unspecified headache type Declines meds, normal neuro ? Tension HA, suggest PT/massage - increase Mag  Estrogen deficiency -     conjugated estrogens (PREMARIN) vaginal cream; Place 1 Applicatorful vaginally daily. - if vaginal discharge not better with this needs OV here or with GYN    Future Appointments  Date Time Provider [place]  01/28/2018  9:00 AM Unk Pinto, MD [facility] None    HPI 67 y.o.female presents for 2 month follow up for HA and HTN.   She has yellow/grey discharge, was treated with cleocin vaginal cream x 7 night 08/12/2017. Had normal wet prep at that time. She has had some burning with urination at the end of urination, she is urinating frequently, trying to increase water. She has significant vaginal dryness.   She has ben complaining of hot flashes as well.   She continues to have headache, has several times a week. She did not start topamax or flexeril or prednisone.  She had a HA today but had not had a headache before that.  Patient has high degree of anxiety, not treated.    Blood pressure 124/72, pulse 66, temperature (!) 97.5 F (36.4 C), resp. rate 16, height 5' 4.5" (1.638 m), weight 143 lb 9.6 oz (65.1 kg), SpO2 99 %.   Past Medical History:  Diagnosis Date  . Arthritis    cervical and lumbar  . Depression   . GERD (gastroesophageal reflux disease)   . Hyperlipidemia   . IBS (irritable bowel syndrome)   . Prediabetes   . Vitamin D deficiency      Allergies  Allergen Reactions  . Bactrim [Sulfamethoxazole-Trimethoprim] Other (See Comments)    Patient preference to take medication without sulfa    Current Outpatient Medications on File Prior to Visit    Medication Sig  . alum & mag hydroxide-simeth (MAALOX/MYLANTA) 200-200-20 MG/5ML suspension Take 30 mLs by mouth every 6 (six) hours as needed for indigestion or heartburn.  Marland Kitchen aspirin 81 MG tablet Take 81 mg by mouth daily.  . cetirizine (ZYRTEC) 10 MG tablet Take 10 mg by mouth daily as needed for allergies.  . Cholecalciferol (D-3-5) 5000 UNITS capsule Take 5,000 Units by mouth daily.  . Flaxseed, Linseed, (FLAXSEED OIL PO) Take by mouth daily.  . fluticasone (FLONASE) 50 MCG/ACT nasal spray Place 2 sprays into both nostrils daily.  . Lutein 20 MG TABS Take by mouth.  . Multiple Vitamins-Minerals (WOMENS MULTI VITAMIN & MINERAL PO) Take 1 tablet by mouth daily.  . Omega-3 Fatty Acids (FISH OIL PO) Take by mouth daily.  . Probiotic Product (ALIGN) 4 MG CAPS Take by mouth daily.  . ranitidine (ZANTAC) 300 MG tablet Take 300 mg by mouth daily as needed for heartburn.  . RESTASIS 0.05 % ophthalmic emulsion instill 1 drop into both eyes twice a day   No current facility-administered medications on file prior to visit.     ROS: all negative except above.   Physical Exam: Filed Weights   10/19/17 1515  Weight: 143 lb 9.6 oz (65.1 kg)   BP 124/72   Pulse 66   Temp (!) 97.5 F (36.4 C)   Resp 16   Ht 5' 4.5" (1.638 m)   Wt 143 lb 9.6 oz (  65.1 kg)   SpO2 99%   BMI 24.27 kg/m  General Appearance: Well nourished, in no apparent distress. Eyes: PERRLA, EOMs, conjunctiva no swelling or erythema Sinuses: No Frontal/maxillary tenderness ENT/Mouth: Ext aud canals clear, TMs without erythema, bulging. No erythema, swelling, or exudate on post pharynx.  Tonsils not swollen or erythematous. Hearing normal.  Neck: Supple, thyroid normal.  Respiratory: Respiratory effort normal, BS equal bilaterally without rales, rhonchi, wheezing or stridor.  Cardio: RRR with no MRGs. Brisk peripheral pulses without edema.  Abdomen: Soft, + BS.  Non tender, no guarding, rebound, hernias, masses. Lymphatics:  Non tender without lymphadenopathy.  Musculoskeletal: Full ROM, 5/5 strength, normal gait.  Skin: Warm, dry without rashes, lesions, ecchymosis.  Neuro: Cranial nerves intact. Normal muscle tone, no cerebellar symptoms. Sensation intact.  Psych: Awake and oriented X 3, normal affect, Insight and Judgment appropriate.     Vicie Mutters, PA-C 3:38 PM [facility]

## 2017-10-19 NOTE — Patient Instructions (Addendum)
Will prescribe vaginal estrogen Use 1/2 of the tube provided or 2 grams of the estrogen cream every night for 2 weeks Then use 1/2 of the tube 2 x a week   If vaginal discharge is not better can come here or can follow up with gYN  Can try medical massage for headaches/TMJ [phone] Artist in motion medical massage   VAGINAL DRYNESS OVERVIEW  Vaginal dryness, also known as atrophic vaginitis, is a common condition in postmenopausal women. This condition is also common in women who have had both ovaries removed at the time of hysterectomy.   Some women have uncomfortable symptoms of vaginal dryness, such as pain with sex, burning vaginal discomfort or itching, or abnormal vaginal discharge, while others have no symptoms at all.  VAGINAL DRYNESS CAUSES   Estrogen helps to keep the vagina moist and to maintain thickness of the vaginal lining. Vaginal dryness occurs when the ovaries produce a decreased amount of estrogen. This can occur at certain times in a woman's life, and may be permanent or temporary. Times when less estrogen is made include: ?At the time of menopause. ?After surgical removal of the ovaries, chemotherapy, or radiation therapy of the pelvis for cancer. ?After having a baby, particularly in women who breastfeed. ?While using certain medications, such as danazol, medroxyprogesterone (brand names: Provera or DepoProvera), leuprolide (brand name: Lupron), or nafarelin. When these medications are stopped, estrogen production resumes.  Women who smoke cigarettes have been shown to have an increased risk of an earlier menopause transition as compared to non-smokers. Therefore, atrophic vaginitis symptoms may appear at a younger age in this population.  VAGINAL DRYNESS TREATMENT   There are three treatment options for women with vaginal dryness:  Vaginal lubricants and moisturizers - Vaginal lubricants and moisturizers can be purchased without a prescription. These products  do not contain any hormones and have virtually no side effects.  - Albolene is found in the facial cleanser section at [facility], [facility], or [employer]. It is a large jar with a blue top. This is the best lubricant for women because it is hypoallergenic. -Natural lubricants, such as olive, avocado or peanut oil, are easily available products that may be used as a lubricant with sex.   -Vaginal moisturizes (eg, Replens, Moist Again, Vagisil, K-Y Silk-E, and Feminease) are formulated to allow water to be retained in the vaginal tissues. Moisturizers are applied into the vagina three times weekly to allow a continued moisturizing effect. These should not be used just before having sex, as they can be irritating.  Vaginal estrogen - Vaginal estrogen is the most effective treatment option for women with vaginal dryness. Vaginal estrogen must be prescribed by a healthcare provider. Very low doses of vaginal estrogen can be used when it is put into the vagina to treat vaginal dryness. A small amount of estrogen is absorbed into the bloodstream, but only about 100 times less than when using estrogen pills or tablets. As a result, there is a much lower risk of side effects, such as blood clots, breast cancer, and heart attack, compared with other estrogen-containing products (birth control pills, menopausal hormone therapy).   Sexual activity - Vaginal estrogen improves vaginal dryness quickly, usually within a few weeks. You may continue to have sex as you treat vaginal dryness because sex itself can help to keep the vaginal tissues healthy. Vaginal intercourse may help the vaginal tissues by keeping them soft and stretchable and preventing the tissues from shrinking.  If sex continues to be  painful despite treatment for vaginal dryness, talk to your healthcare provider.     To prevent migraines you can try these natural/OTC medications as prevention: Magnesium can do 500mg  1-2 at night for migraines see below on  how to avoid diarrhea. CoQ10 100mg  3 x a day  Melatonin 5mg -15mg  30 mins before bed Riboflavin/B2 400mg  daily   We may also treat TMJ if we think you have it If you are having frequent migraines we may put you on a once a day medication with fast acting medication to take. Also there is such a thing called rebound headache from over use of acute medications.  Please do not use rescue or acute medications more than 10 days a month or more than 3 days per week, this can cause a withdrawal and a rebound headache.  Here is more information below  Please remember, common headache triggers are: sleep deprivation, dehydration, overheating, stress, hypoglycemia or skipping meals and blood sugar fluctuations, excessive pain medications or excessive alcohol use or caffeine withdrawal. Some people have food triggers such as aged cheese, orange juice or chocolate, especially dark chocolate, or MSG (monosodium glutamate). Try to avoid these headache triggers as much possible.   It may be helpful to keep a headache diary to figure out what makes your headaches worse or brings them on and what alleviates them. Some people report headache onset after exercise but studies have shown that regular exercise may actually prevent headaches from coming. If you have exercise-induced headaches, please make sure that you drink plenty of fluid before and after exercising and that you do not over do it and do not overheat.   Please go to the ER if there is weakness, thunderclap headache, visual changes, or any concerning factors    Migraine Headache A migraine headache is an intense, throbbing pain on one or both sides of your head. Recurrent migraines keep coming back. A migraine can last for 30 minutes to several hours. CAUSES  The exact cause of a migraine headache is not always known. However, a migraine may be caused when nerves in the brain become irritated and release chemicals that cause inflammation. This  causes pain. Certain things may also trigger migraines, such as:   Alcohol.  Smoking.  Stress.  Menstruation.  Aged cheeses.  Foods or drinks that contain nitrates, glutamate, aspartame, or tyramine.  Lack of sleep.  Chocolate.  Caffeine.  Hunger.  Physical exertion.  Fatigue.  Medicines used to treat chest pain (nitroglycerine), birth control pills, estrogen, and some blood pressure medicines. SYMPTOMS   Pain on one or both sides of your head.  Pulsating or throbbing pain.  Severe pain that prevents daily activities.  Pain that is aggravated by any physical activity.  Nausea, vomiting, or both.  Dizziness.  Pain with exposure to bright lights, loud noises, or activity.  General sensitivity to bright lights, loud noises, or smells. Before you get a migraine, you may get warning signs that a migraine is coming (aura). An aura may include:  Seeing flashing lights.  Seeing bright spots, halos, or zigzag lines.  Having tunnel vision or blurred vision.  Having feelings of numbness or tingling.  Having trouble talking.  Having muscle weakness. DIAGNOSIS  A recurrent migraine headache is often diagnosed based on:  Symptoms.  Physical examination.  A CT scan or MRI of your head. These imaging tests cannot diagnose migraines but can help rule out other causes of headaches.  TREATMENT  Medicines may be given for pain  and nausea. Medicines can also be given to help prevent recurrent migraines. HOME CARE INSTRUCTIONS  Only take over-the-counter or prescription medicines for pain or discomfort as directed by your health care provider. The use of long-term narcotics is not recommended.  Lie down in a dark, quiet room when you have a migraine.  Keep a journal to find out what may trigger your migraine headaches. For example, write down:  What you eat and drink.  How much sleep you get.  Any change to your diet or medicines.  Limit alcohol  consumption.  Quit smoking if you smoke.  Get 7-9 hours of sleep, or as recommended by your health care provider.  Limit stress.  Keep lights dim if bright lights bother you and make your migraines worse. SEEK MEDICAL CARE IF:   You do not get relief from the medicines given to you.  You have a recurrence of pain.  You have a fever. SEEK IMMEDIATE MEDICAL CARE IF:  Your migraine becomes severe.  You have a stiff neck.  You have loss of vision.  You have muscular weakness or loss of muscle control.  You start losing your balance or have trouble walking.  You feel faint or pass out. You have severe symptoms that are different from your first symptoms. MAKE SURE YOU:   Understand these instructions.  Will watch your condition.  Will get help right away if you are not doing well or get worse.   This information is not intended to replace advice given to you by your health care provider. Make sure you discuss any questions you have with your health care provider.   Document Released: 02/25/2001 Document Revised: 06/23/2014 Document Reviewed: 02/07/2013 Elsevier Interactive Patient Education 2016 [employer].  Common Migraine Triggers   Foods Aged cheese, alcohol, nuts, chocolate, yogurt, onions, figs, liver, caffeinated foods and beverages, monosodium glutamate (MSG), smoked or pickled fish/meat, nitrate/nitrate preserved foods (hotdogs, pepperoni, salami) tyramine  Medications Antibiotics (tetracycline, griseofulvin), antihypertensives (nifedipine, captopril), hormones (oral contraceptives, estrogens), histamine-2 blockers (cimetidine, raniidine, vasodilators (nitroglycerine, isosorbide dinitrate)  Sensory Stimuli Flickering/bright/fluorescent lights, bright sunlight, odors (perfume, chemicals, cigarette smoke)  Lifestyle Changes Time zones, sleep patterns, eating habits, caffeine withdrawal stress  Other Menstrual cycle, weather/season/air pressure changes, high  altitude  Adapted from Hawley and Brimson, [place]. [facility] [place]; [place]op[place] and [place]. Conquering Headache, 1998  Hormonal variations also are believed to play a part.  Fluctuations of the female hormone estrogen (such as just before menstruation) affect a chemical called serotonin-when serotonin levels in the brain fall, the dilation (expansion) of blood vessels in the brain that is characteristic of migraine often follows.  Many factors or "triggers" can start a migraine.  In people who get migraines, most experts think certain activities or foods may trigger temporary changes in the blood vessels around the brain.  Swelling of these blood vessels may cause pain in the nearby nerves.  Allergy Headaches:  Hotdogs Milk  Onions  Thyme Bacon  Chocolate Garlic  Nutmeg Ham  Dark Cola Pork  Cinnamon Salami  Nuts  Egg  Ginger Sausage Red wine Cloves  Cheddar Cheese Caffeine  Ways to prevent diarrhea with magnesium:  1) Don't take all your magnesium at the same time, have 2-3 smaller doses through out the day 2) Try taking your magnesium with high fiber meals.  3) If this does not help, take the magnesium on an empty stomach. Fiber for some people can bind the magnesium too well and prevent absorption in  your gut.  4) Lastly try different types of magnesium. Most people are taking magnesium citrate, you can also try dimalate capsules which are slow release. You can also find magnesium lotions/sprays for the skin that bypass the gut. Another one that has good absorption is ReMag (pico-iconic magnesium formula), this has great cellular absorption so less of a laxative effect. You can find these type at health food stores or online.

## 2017-10-20 LAB — URINALYSIS, ROUTINE W REFLEX MICROSCOPIC
Bilirubin Urine: NEGATIVE
GLUCOSE, UA: NEGATIVE
Hgb urine dipstick: NEGATIVE
Ketones, ur: NEGATIVE
LEUKOCYTES UA: NEGATIVE
NITRITE: NEGATIVE
PH: 5.5 (ref 5.0–8.0)
Protein, ur: NEGATIVE
SPECIFIC GRAVITY, URINE: 1.007 (ref 1.001–1.03)

## 2017-10-20 LAB — URINE CULTURE
MICRO NUMBER:: 90550851
RESULT: NO GROWTH
SPECIMEN QUALITY: ADEQUATE

## 2017-10-20 LAB — HM DIABETES EYE EXAM

## 2017-10-26 ENCOUNTER — Telehealth: Payer: Self-pay

## 2017-10-26 NOTE — Telephone Encounter (Signed)
Pt was made aware of lab results. May 13th 2019 by DD

## 2017-10-27 ENCOUNTER — Encounter: Payer: Self-pay | Admitting: Internal Medicine

## 2018-01-18 ENCOUNTER — Encounter: Payer: Self-pay | Admitting: Internal Medicine

## 2018-01-20 LAB — HM MAMMOGRAPHY

## 2018-01-25 ENCOUNTER — Encounter: Payer: Self-pay | Admitting: Internal Medicine

## 2018-01-28 ENCOUNTER — Encounter: Payer: Self-pay | Admitting: Internal Medicine

## 2018-01-28 ENCOUNTER — Ambulatory Visit: Payer: Medicare Other | Admitting: Internal Medicine

## 2018-01-28 VITALS — BP 104/58 | HR 60 | Temp 97.3°F | Resp 16 | Ht 65.0 in | Wt 143.8 lb

## 2018-01-28 DIAGNOSIS — Z79899 Other long term (current) drug therapy: Secondary | ICD-10-CM

## 2018-01-28 DIAGNOSIS — R0989 Other specified symptoms and signs involving the circulatory and respiratory systems: Secondary | ICD-10-CM

## 2018-01-28 DIAGNOSIS — Z0001 Encounter for general adult medical examination with abnormal findings: Secondary | ICD-10-CM

## 2018-01-28 DIAGNOSIS — Z136 Encounter for screening for cardiovascular disorders: Secondary | ICD-10-CM

## 2018-01-28 DIAGNOSIS — E559 Vitamin D deficiency, unspecified: Secondary | ICD-10-CM

## 2018-01-28 DIAGNOSIS — K219 Gastro-esophageal reflux disease without esophagitis: Secondary | ICD-10-CM

## 2018-01-28 DIAGNOSIS — E782 Mixed hyperlipidemia: Secondary | ICD-10-CM

## 2018-01-28 DIAGNOSIS — Z1212 Encounter for screening for malignant neoplasm of rectum: Secondary | ICD-10-CM

## 2018-01-28 DIAGNOSIS — Z1211 Encounter for screening for malignant neoplasm of colon: Secondary | ICD-10-CM

## 2018-01-28 DIAGNOSIS — R7303 Prediabetes: Secondary | ICD-10-CM

## 2018-01-28 DIAGNOSIS — R7309 Other abnormal glucose: Secondary | ICD-10-CM

## 2018-01-28 NOTE — Progress Notes (Signed)
[facility] Unk Pinto, M.D.     Uvaldo Bristle. Silverio Lay, P.A.-C Liane Comber, [place] [address] [place], [place] [address] Telephone [phone] Telefax [phone] Annual Screening/Preventative Visit & Comprehensive Evaluation &  Examination     This very nice 67 y.o. MBF presents for a Screening /Preventative Visit & comprehensive evaluation and management of multiple medical co-morbidities.  Patient has been followed for labile HTN, HLD, Prediabetes  and Vitamin D Deficiency. She's also been followed by Dr Earlean Shawl for IBS & GERD.     Patient is followed expectantly for labile  HTN. Patient's BP has been controlled at home and patient denies any cardiac symptoms as chest pain, palpitations, shortness of breath, dizziness or ankle swelling. Today's BP: (!) 104/58      Patient's hyperlipidemia is not controlled with diet. Last lipids were not at goal and sh's been reticent to take meds for cholesterol.  Lab Results  Component Value Date   CHOL 237 (H) 01/28/2018   HDL 74 01/28/2018   LDLCALC 147 (H) 01/28/2018   TRIG 61 01/28/2018   CHOLHDL 3.2 01/28/2018      Patient has hx/o prediabetes (A1c 5.7%/2014, 5.9%/2015 & 6.1%/2016)  and patient denies reactive hypoglycemic symptoms, visual blurring, diabetic polys, or paresthesias. Last A1c was almost to goal: Lab Results  Component Value Date   HGBA1C 5.8 (H) 01/28/2018      Finally, patient has history of Vitamin D Deficiency and last Vitamin D was  Lab Results  Component Value Date   VD25OH 7 01/28/2018   Current Outpatient Medications on File Prior to Visit  Medication Sig  . alum & mag hydroxide-simeth (MAALOX/MYLANTA) 200-200-20 MG/5ML suspension Take 30 mLs by mouth every 6 (six) hours as needed for indigestion or heartburn.  Marland Kitchen aspirin 81 MG tablet Take 81 mg by mouth daily.  . cetirizine (ZYRTEC) 10 MG tablet Take 10 mg by mouth daily as  needed for allergies.  . Cholecalciferol (D-3-5) 5000 UNITS capsule Take 5,000 Units by mouth daily.  Marland Kitchen conjugated estrogens (PREMARIN) vaginal cream Place 1 Applicatorful vaginally daily.  Marland Kitchen DEXILANT 60 MG capsule Take 60 mg by mouth daily.  . Flaxseed, Linseed, (FLAXSEED OIL PO) Take by mouth daily.  . fluticasone (FLONASE) 50 MCG/ACT nasal spray Place 2 sprays into both nostrils daily.  Marland Kitchen LINZESS 72 MCG capsule Take 72 mcg by mouth daily.  . Lutein 20 MG TABS Take by mouth.  . Multiple Vitamins-Minerals (WOMENS MULTI VITAMIN & MINERAL PO) Take 1 tablet by mouth daily.  . Omega-3 Fatty Acids (FISH OIL PO) Take by mouth daily.  . Probiotic Product (ALIGN) 4 MG CAPS Take by mouth daily.  . ranitidine (ZANTAC) 300 MG tablet Take 300 mg by mouth daily as needed for heartburn.  . RESTASIS 0.05 % ophthalmic emulsion instill 1 drop into both eyes twice a day   No current facility-administered medications on file prior to visit.    Allergies  Allergen Reactions  . Bactrim [Sulfamethoxazole-Trimethoprim] Other (See Comments)    Patient preference to take medication without sulfa   Past Medical History:  Diagnosis Date  . Arthritis    cervical and lumbar  . Depression   . GERD (gastroesophageal reflux disease)   . Hyperlipidemia   . IBS (irritable bowel syndrome)   . Prediabetes   . Vitamin D deficiency    Health Maintenance  Topic Date Due  . TETANUS/TDAP  05/17/2017  . INFLUENZA VACCINE  01/14/2018  .  PNA vac Low Risk Adult (1 of 2 - PCV13) 09/29/2018 (Originally 04/17/2017)  . MAMMOGRAM  01/21/2019  . PAP SMEAR  04/06/2020  . COLONOSCOPY  08/16/2025  . DEXA SCAN  Completed  . Hepatitis C Screening  Completed  . HIV Screening  Completed   Immunization History  Administered Date(s) Administered  . Influenza, High Dose Seasonal PF 06/25/2017  . PPD Test 12/12/2014, 12/26/2015, 01/15/2017  . Td 05/18/2007  . Zoster 05/17/2006   Last Colon - 08/17/2015 - Dr Collene Mares - recc f/u 10  yrs - due Mar 2027  Last MGM - 01/20/2018 Teola Bradley - recc 1 yr f/u   Past Surgical History:  Procedure Laterality Date  . CARDIAC CATHETERIZATION  2011   normal (Dr. Terrence Dupont)  . RADIOACTIVE SEED GUIDED EXCISIONAL BREAST BIOPSY Right 03/11/2016   Procedure: RIGHT RADIOACTIVE SEED GUIDED EXCISIONAL BREAST BIOPSY;  Surgeon: Rolm Bookbinder, MD;  Location: [facility];  Service: General;  Laterality: Right;   Family History  Problem Relation Age of Onset  . Diabetes Father   . Cancer Maternal Grandmother        Lung   Social History   Tobacco Use  . Smoking status: Never Smoker  . Smokeless tobacco: Never Used  Substance Use Topics  . Alcohol use: Yes    Comment: Rare  . Drug use: No    ROS Constitutional: Denies fever, chills, weight loss/gain, headaches, insomnia,  night sweats, and change in appetite. Does c/o fatigue. Eyes: Denies redness, blurred vision, diplopia, discharge, itchy, watery eyes.  ENT: Denies discharge, congestion, post nasal drip, epistaxis, sore throat, earache, hearing loss, dental pain, Tinnitus, Vertigo, Sinus pain, snoring.  Cardio: Denies chest pain, palpitations, irregular heartbeat, syncope, dyspnea, diaphoresis, orthopnea, PND, claudication, edema Respiratory: denies cough, dyspnea, DOE, pleurisy, hoarseness, laryngitis, wheezing.  Gastrointestinal: Denies dysphagia, heartburn, reflux, water brash, pain, cramps, nausea, vomiting, bloating, diarrhea, constipation, hematemesis, melena, hematochezia, jaundice, hemorrhoids Genitourinary: Denies dysuria, frequency, urgency, nocturia, hesitancy, discharge, hematuria, flank pain Breast: Breast lumps, nipple discharge, bleeding.  Musculoskeletal: Denies arthralgia, myalgia, stiffness, Jt. Swelling, pain, limp, and strain/sprain. Denies falls. Skin: Denies puritis, rash, hives, warts, acne, eczema, changing in skin lesion Neuro: No weakness, tremor, incoordination, spasms, paresthesia,  pain Psychiatric: Denies confusion, memory loss, sensory loss. Denies Depression. Endocrine: Denies change in weight, skin, hair change, nocturia, and paresthesia, diabetic polys, visual blurring, hyper / hypo glycemic episodes.  Heme/Lymph: No excessive bleeding, bruising, enlarged lymph nodes.  Physical Exam  BP  104/58    60   T 97.3 F   R 16   Ht 5\' 5"     Wt 143 lb 12.8 oz    BMI 23.93   Postural sitting BP  104/58   P 60   &   Standing BP 110/66   P 62   General Appearance: Well nourished, well groomed and in no apparent distress.  Eyes: PERRLA, EOMs, conjunctiva no swelling or erythema, normal fundi and vessels. Sinuses: No frontal/maxillary tenderness ENT/Mouth: EACs patent / TMs  nl. Nares clear without erythema, swelling, mucoid exudates. Oral hygiene is good. No erythema, swelling, or exudate. Tongue normal, non-obstructing. Tonsils not swollen or erythematous. Hearing normal.  Neck: Supple, thyroid not palpable. No bruits, nodes or JVD. Respiratory: Respiratory effort normal.  BS equal and clear bilateral without rales, rhonci, wheezing or stridor. Cardio: Heart sounds are normal with regular rate and rhythm and no murmurs, rubs or gallops. Peripheral pulses are normal and equal bilaterally without edema. No aortic or  femoral bruits. Chest: symmetric with normal excursions and percussion. Breasts: Symmetric, without lumps, nipple discharge, retractions, or fibrocystic changes.  Abdomen: Flat, soft with bowel sounds active. Nontender, no guarding, rebound, hernias, masses, or organomegaly.  Lymphatics: Non tender without lymphadenopathy.  Musculoskeletal: Full ROM all peripheral extremities, joint stability, 5/5 strength, and normal gait. Skin: Warm and dry without rashes, lesions, cyanosis, clubbing or  ecchymosis.  Neuro: Cranial nerves intact, reflexes equal bilaterally. Normal muscle tone, no cerebellar symptoms. Sensation intact.  Pysch: Alert and oriented X 3, normal  affect, Insight and Judgment appropriate.   Assessment and Plan  1. Annual Preventative Screening Examination  2. Labile hypertension  - EKG 12-Lead - Urinalysis, Routine w reflex microscopic - Microalbumin / creatinine urine ratio - CBC with Differential/Platelet - COMPLETE METABOLIC PANEL WITH GFR - Magnesium - TSH  3. Hyperlipidemia, mixed  - EKG 12-Lead - Lipid panel - TSH  4. Abnormal glucose  - EKG 12-Lead - Hemoglobin A1c - Insulin, random  5. Vitamin D deficiency  - VITAMIN D 25 Hydroxy  6. Prediabetes  - EKG 12-Lead - Hemoglobin A1c - Insulin, random  7. Gastroesophageal reflux disease  - CBC with Differential/Platelet  8. Screening for colorectal cancer  - POC Hemoccult Bld/Stl  9. Screening for ischemic heart disease  - EKG 12-Lead  10. Medication management  - Urinalysis, Routine w reflex microscopic - Microalbumin / creatinine urine ratio - CBC with Differential/Platelet - COMPLETE METABOLIC PANEL WITH GFR - Magnesium - Lipid panel - TSH - Hemoglobin A1c - Insulin, random - VITAMIN D 25 Hydroxy       Patient was counseled in prudent diet to achieve/maintain BMI less than 25 for weight control, BP monitoring, regular exercise and medications. Discussed med's effects and SE's. Screening labs and tests as requested with regular follow-up as recommended. Over 40 minutes of exam, counseling, chart review and high complex critical decision making was performed.

## 2018-01-28 NOTE — Patient Instructions (Signed)

## 2018-01-29 LAB — COMPLETE METABOLIC PANEL WITH GFR
AG RATIO: 1.4 (calc) (ref 1.0–2.5)
ALBUMIN MSPROF: 4.2 g/dL (ref 3.6–5.1)
ALKALINE PHOSPHATASE (APISO): 84 U/L (ref 33–130)
ALT: 13 U/L (ref 6–29)
AST: 19 U/L (ref 10–35)
BILIRUBIN TOTAL: 0.5 mg/dL (ref 0.2–1.2)
BUN: 11 mg/dL (ref 7–25)
CO2: 27 mmol/L (ref 20–32)
CREATININE: 0.75 mg/dL (ref 0.50–0.99)
Calcium: 9.6 mg/dL (ref 8.6–10.4)
Chloride: 105 mmol/L (ref 98–110)
GFR, Est African American: 97 mL/min/{1.73_m2} (ref >=60)
GFR, Est Non African American: 84 mL/min/{1.73_m2} (ref >=60)
GLOBULIN: 3 g/dL (ref 1.9–3.7)
Glucose, Bld: 85 mg/dL (ref 65–99)
POTASSIUM: 4.1 mmol/L (ref 3.5–5.3)
SODIUM: 141 mmol/L (ref 135–146)
Total Protein: 7.2 g/dL (ref 6.1–8.1)

## 2018-01-29 LAB — CBC WITH DIFFERENTIAL/PLATELET
Basophils Absolute: 21 cells/uL (ref 0–200)
Basophils Relative: 0.7 %
EOS ABS: 81 {cells}/uL (ref 15–500)
Eosinophils Relative: 2.7 %
HEMATOCRIT: 38 % (ref 35.0–45.0)
HEMOGLOBIN: 12.8 g/dL (ref 11.7–15.5)
LYMPHS ABS: 1536 {cells}/uL (ref 850–3900)
MCH: 28.8 pg (ref 27.0–33.0)
MCHC: 33.7 g/dL (ref 32.0–36.0)
MCV: 85.6 fL (ref 80.0–100.0)
MPV: 12.1 fL (ref 7.5–12.5)
Monocytes Relative: 8.8 %
NEUTROS ABS: 1098 {cells}/uL — AB (ref 1500–7800)
NEUTROS PCT: 36.6 %
Platelets: 175 10*3/uL (ref 140–400)
RBC: 4.44 10*6/uL (ref 3.80–5.10)
RDW: 12.5 % (ref 11.0–15.0)
Total Lymphocyte: 51.2 %
WBC: 3 10*3/uL — ABNORMAL LOW (ref 3.8–10.8)
WBCMIX: 264 {cells}/uL (ref 200–950)

## 2018-01-29 LAB — URINALYSIS, ROUTINE W REFLEX MICROSCOPIC
BACTERIA UA: NONE SEEN /HPF
Bilirubin Urine: NEGATIVE
GLUCOSE, UA: NEGATIVE
HGB URINE DIPSTICK: NEGATIVE
Hyaline Cast: NONE SEEN /LPF
KETONES UR: NEGATIVE
Nitrite: NEGATIVE
PH: 6.5 (ref 5.0–8.0)
PROTEIN: NEGATIVE
RBC / HPF: NONE SEEN /HPF (ref 0–2)
Specific Gravity, Urine: 1.013 (ref 1.001–1.03)
Squamous Epithelial / LPF: NONE SEEN /HPF (ref <=5)
WBC UA: NONE SEEN /HPF (ref 0–5)

## 2018-01-29 LAB — INSULIN, RANDOM: Insulin: <1 u[IU]/mL — ABNORMAL LOW (ref 2.0–19.6)

## 2018-01-29 LAB — LIPID PANEL
Cholesterol: 237 mg/dL — ABNORMAL HIGH (ref <200)
HDL: 74 mg/dL (ref >50)
LDL CHOLESTEROL (CALC): 147 mg/dL — AB
Non-HDL Cholesterol (Calc): 163 mg/dL (calc) — ABNORMAL HIGH (ref <130)
Total CHOL/HDL Ratio: 3.2 (calc) (ref <5.0)
Triglycerides: 61 mg/dL (ref <150)

## 2018-01-29 LAB — HEMOGLOBIN A1C
EAG (MMOL/L): 6.6 (calc)
HEMOGLOBIN A1C: 5.8 %{Hb} — AB (ref <5.7)
MEAN PLASMA GLUCOSE: 120 (calc)

## 2018-01-29 LAB — MAGNESIUM: MAGNESIUM: 2.1 mg/dL (ref 1.5–2.5)

## 2018-01-29 LAB — MICROALBUMIN / CREATININE URINE RATIO
Creatinine, Urine: 60 mg/dL (ref 20–275)
Microalb Creat Ratio: 3 mcg/mg creat (ref <30)
Microalb, Ur: 0.2 mg/dL

## 2018-01-29 LAB — VITAMIN D 25 HYDROXY (VIT D DEFICIENCY, FRACTURES): Vit D, 25-Hydroxy: 70 ng/mL (ref 30–100)

## 2018-01-29 LAB — TSH: TSH: 1.07 mIU/L (ref 0.40–4.50)

## 2018-01-30 ENCOUNTER — Other Ambulatory Visit: Payer: Self-pay | Admitting: Internal Medicine

## 2018-01-30 DIAGNOSIS — E782 Mixed hyperlipidemia: Secondary | ICD-10-CM

## 2018-01-30 MED ORDER — EZETIMIBE 10 MG PO TABS: 10.0000 mg | ORAL_TABLET | Freq: Every day | ORAL | 1 refills | Status: DC

## 2018-01-31 ENCOUNTER — Encounter: Payer: Self-pay | Admitting: Internal Medicine

## 2018-02-22 ENCOUNTER — Encounter: Payer: Self-pay | Admitting: Internal Medicine

## 2018-02-22 ENCOUNTER — Ambulatory Visit: Payer: Medicare Other | Admitting: Internal Medicine

## 2018-02-22 VITALS — BP 104/66 | HR 68 | Temp 97.7°F | Resp 16 | Ht 65.0 in | Wt 144.2 lb

## 2018-02-22 DIAGNOSIS — K219 Gastro-esophageal reflux disease without esophagitis: Secondary | ICD-10-CM

## 2018-02-22 DIAGNOSIS — K581 Irritable bowel syndrome with constipation: Secondary | ICD-10-CM | POA: Diagnosis not present

## 2018-02-22 NOTE — Patient Instructions (Signed)
Irritable Bowel Syndrome, Adult Irritable bowel syndrome (IBS) is not one specific disease. It is a group of symptoms that affects the organs responsible for digestion (gastrointestinal or GI tract). To regulate how your GI tract works, your body sends signals back and forth between your intestines and your brain. If you have IBS, there may be a problem with these signals. As a result, your GI tract does not function normally. Your intestines may become more sensitive and overreact to certain things. This is especially true when you eat certain foods or when you are under stress. There are four types of IBS. These may be determined based on the consistency of your stool:  IBS with diarrhea.  IBS with constipation.  Mixed IBS.  Unsubtyped IBS.  It is important to know which type of IBS you have. Some treatments are more likely to be helpful for certain types of IBS. What are the causes? The exact cause of IBS is not known. What increases the risk? You may have a higher risk of IBS if:  You are a woman.  You are younger than 66 years old.  You have a family history of IBS.  You have mental health problems.  You have had bacterial infection of your GI tract.  What are the signs or symptoms? Symptoms of IBS vary from person to person. The main symptom is abdominal pain or discomfort. Additional symptoms usually include one or more of the following:  Diarrhea, constipation, or both.  Abdominal swelling or bloating.  Feeling full or sick after eating a small or regular-size meal.  Frequent gas.  Mucus in the stool.  A feeling of having more stool left after a bowel movement.  Symptoms tend to come and go. They may be associated with stress, psychiatric conditions, or nothing at all. How is this diagnosed? There is no specific test to diagnose IBS. Your health care provider will make a diagnosis based on a physical exam, medical history, and your symptoms. You may have other  tests to rule out other conditions that may be causing your symptoms. These may include:  Blood tests.  X-rays.  CT scan.  Endoscopy and colonoscopy. This is a test in which your GI tract is viewed with a long, thin, flexible tube.  How is this treated? There is no cure for IBS, but treatment can help relieve symptoms. IBS treatment often includes:  Changes to your diet, such as: ? Eating more fiber. ? Avoiding foods that cause symptoms. ? Drinking more water. ? Eating regular, medium-sized portioned meals.  Medicines. These may include: ? Fiber supplements if you have constipation. ? Medicine to control diarrhea (antidiarrheal medicines). ? Medicine to help control muscle spasms in your GI tract (antispasmodic medicines). ? Medicines to help with any mental health issues, such as antidepressants or tranquilizers.  Therapy. ? Talk therapy may help with anxiety, depression, or other mental health issues that can make IBS symptoms worse.  Stress reduction. ? Managing your stress can help keep symptoms under control.  Follow these instructions at home:  Take medicines only as directed by your health care provider.  Eat a healthy diet. ? Avoid foods and drinks with added sugar. ? Include more whole grains, fruits, and vegetables gradually into your diet. This may be especially helpful if you have IBS with constipation. ? Avoid any foods and drinks that make your symptoms worse. These may include dairy products and caffeinated or carbonated drinks. ? Do not eat large meals. ? Drink enough   fluid to keep your urine clear or pale yellow.  Exercise regularly. Ask your health care provider for recommendations of good activities for you.  Keep all follow-up visits as directed by your health care provider. This is important. Contact a health care provider if:  You have constant pain.  You have trouble or pain with swallowing.  You have worsening diarrhea. Get help right away  if:  You have severe and worsening abdominal pain.  You have diarrhea and: ? You have a rash, stiff neck, or severe headache. ? You are irritable, sleepy, or difficult to awaken. ? You are weak, dizzy, or extremely thirsty.  You have bright red blood in your stool or you have black tarry stools.  You have unusual abdominal swelling that is painful.  You vomit continuously.  You vomit blood (hematemesis).  You have both abdominal pain and a fever. This information is not intended to replace advice given to you by your health care provider. Make sure you discuss any questions you have with your health care provider. Document Released: 06/02/2005 Document Revised: 11/02/2015 Document Reviewed: 02/17/2014 Elsevier Interactive Patient Education  2018 [employer].  ++++++++++++++++++++++++++++++++ Gastroesophageal Reflux Disease, Adult Normally, food travels down the esophagus and stays in the stomach to be digested. However, when a person has gastroesophageal reflux disease (GERD), food and stomach acid move back up into the esophagus. When this happens, the esophagus becomes sore and inflamed. Over time, GERD can create small holes (ulcers) in the lining of the esophagus. What are the causes? This condition is caused by a problem with the muscle between the esophagus and the stomach (lower esophageal sphincter, or LES). Normally, the LES muscle closes after food passes through the esophagus to the stomach. When the LES is weakened or abnormal, it does not close properly, and that allows food and stomach acid to go back up into the esophagus. The LES can be weakened by certain dietary substances, medicines, and medical conditions, including:  Tobacco use.  Pregnancy.  Having a hiatal hernia.  Heavy alcohol use.  Certain foods and beverages, such as coffee, chocolate, onions, and peppermint.  What increases the risk? This condition is more likely to develop in:  People who have  an increased body weight.  People who have connective tissue disorders.  People who use NSAID medicines.  What are the signs or symptoms? Symptoms of this condition include:  Heartburn.  Difficult or painful swallowing.  The feeling of having a lump in the throat.  Abitter taste in the mouth.  Bad breath.  Having a large amount of saliva.  Having an upset or bloated stomach.  Belching.  Chest pain.  Shortness of breath or wheezing.  Ongoing (chronic) cough or a night-time cough.  Wearing away of tooth enamel.  Weight loss.  Different conditions can cause chest pain. Make sure to see your health care provider if you experience chest pain. How is this diagnosed? Your health care provider will take a medical history and perform a physical exam. To determine if you have mild or severe GERD, your health care provider may also monitor how you respond to treatment. You may also have other tests, including:  An endoscopy toexamine your stomach and esophagus with a small camera.  A test thatmeasures the acidity level in your esophagus.  A test thatmeasures how much pressure is on your esophagus.  A barium swallow or modified barium swallow to show the shape, size, and functioning of your esophagus.  How is this  treated? The goal of treatment is to help relieve your symptoms and to prevent complications. Treatment for this condition may vary depending on how severe your symptoms are. Your health care provider may recommend:  Changes to your diet.  Medicine.  Surgery.  Follow these instructions at home: Diet  Follow a diet as recommended by your health care provider. This may involve avoiding foods and drinks such as: ? Coffee and tea (with or without caffeine). ? Drinks that containalcohol. ? Energy drinks and sports drinks. ? Carbonated drinks or sodas. ? Chocolate and cocoa. ? Peppermint and mint flavorings. ? Garlic and onions. ? Horseradish. ? Spicy  and acidic foods, including peppers, chili powder, curry powder, vinegar, hot sauces, and barbecue sauce. ? Citrus fruit juices and citrus fruits, such as oranges, lemons, and limes. ? Tomato-based foods, such as red sauce, chili, salsa, and pizza with red sauce. ? Fried and fatty foods, such as donuts, french fries, potato chips, and high-fat dressings. ? High-fat meats, such as hot dogs and fatty cuts of red and white meats, such as rib eye steak, sausage, ham, and bacon. ? High-fat dairy items, such as whole milk, butter, and cream cheese.  Eat small, frequent meals instead of large meals.  Avoid drinking large amounts of liquid with your meals.  Avoid eating meals during the 2-3 hours before bedtime.  Avoid lying down right after you eat.  Do not exercise right after you eat. General instructions  Pay attention to any changes in your symptoms.  Take over-the-counter and prescription medicines only as told by your health care provider. Do not take aspirin, ibuprofen, or other NSAIDs unless your health care provider told you to do so.  Do not use any tobacco products, including cigarettes, chewing tobacco, and e-cigarettes. If you need help quitting, ask your health care provider.  Wear loose-fitting clothing. Do not wear anything tight around your waist that causes pressure on your abdomen.  Raise (elevate) the head of your bed 6 inches (15cm).  Try to reduce your stress, such as with yoga or meditation. If you need help reducing stress, ask your health care provider.  If you are overweight, reduce your weight to an amount that is healthy for you. Ask your health care provider for guidance about a safe weight loss goal.  Keep all follow-up visits as told by your health care provider. This is important. Contact a health care provider if:  You have new symptoms.  You have unexplained weight loss.  You have difficulty swallowing, or it hurts to swallow.  You have wheezing or  a persistent cough.  Your symptoms do not improve with treatment.  You have a hoarse voice. Get help right away if:  You have pain in your arms, neck, jaw, teeth, or back.  You feel sweaty, dizzy, or light-headed.  You have chest pain or shortness of breath.  You vomit and your vomit looks like blood or coffee grounds.  You faint.  Your stool is bloody or black.  You cannot swallow, drink, or eat. This information is not intended to replace advice given to you by your health care provider. Make sure you discuss any questions you have with your health care provider. Document Released: 03/12/2005 Document Revised: 10/31/2015 Document Reviewed: 09/27/2014 Elsevier Interactive Patient Education  2018 [employer].  ++++++++++++++++++++++++++++++++++++++++++++++ Food Choices for Gastroesophageal Reflux Disease, Adult When you have gastroesophageal reflux disease (GERD), the foods you eat and your eating habits are very important. Choosing the right foods  can help ease the discomfort of GERD. Consider working with a diet and nutrition specialist (dietitian) to help you make healthy food choices. What general guidelines should I follow? Eating plan  Choose healthy foods low in fat, such as fruits, vegetables, whole grains, low-fat dairy products, and lean meat, fish, and poultry.  Eat frequent, small meals instead of three large meals each day. Eat your meals slowly, in a relaxed setting. Avoid bending over or lying down until 2-3 hours after eating.  Limit high-fat foods such as fatty meats or fried foods.  Limit your intake of oils, butter, and shortening to less than 8 teaspoons each day.  Avoid the following: ? Foods that cause symptoms. These may be different for different people. Keep a food diary to keep track of foods that cause symptoms. ? Alcohol. ? Drinking large amounts of liquid with meals. ? Eating meals during the 2-3 hours before bed.  Cook foods using methods  other than frying. This may include baking, grilling, or broiling. Lifestyle   Maintain a healthy weight. Ask your health care provider what weight is healthy for you. If you need to lose weight, work with your health care provider to do so safely.  Exercise for at least 30 minutes on 5 or more days each week, or as told by your health care provider.  Avoid wearing clothes that fit tightly around your waist and chest.  Do not use any products that contain nicotine or tobacco, such as cigarettes and e-cigarettes. If you need help quitting, ask your health care provider.  Sleep with the head of your bed raised. Use a wedge under the mattress or blocks under the bed frame to raise the head of the bed. What foods are not recommended? The items listed may not be a complete list. Talk with your dietitian about what dietary choices are best for you. Grains Pastries or quick breads with added fat. [place] toast. Vegetables Deep fried vegetables. [place] fries. Any vegetables prepared with added fat. Any vegetables that cause symptoms. For some people this may include tomatoes and tomato products, chili peppers, onions and garlic, and horseradish. Fruits Any fruits prepared with added fat. Any fruits that cause symptoms. For some people this may include citrus fruits, such as oranges, grapefruit, pineapple, and lemons. Meats and other protein foods High-fat meats, such as fatty beef or pork, hot dogs, ribs, ham, sausage, salami and bacon. Fried meat or protein, including fried fish and fried chicken. Nuts and nut butters. Dairy Whole milk and chocolate milk. Sour cream. Cream. Ice cream. Cream cheese. Milk shakes. Beverages Coffee and tea, with or without caffeine. Carbonated beverages. Sodas. Energy drinks. Fruit juice made with acidic fruits (such as orange or grapefruit). Tomato juice. Alcoholic drinks. Fats and oils Butter. Margarine. Shortening. Ghee. Sweets and desserts Chocolate and cocoa.  Donuts. Seasoning and other foods Pepper. Peppermint and spearmint. Any condiments, herbs, or seasonings that cause symptoms. For some people, this may include curry, hot sauce, or vinegar-based salad dressings. Summary  When you have gastroesophageal reflux disease (GERD), food and lifestyle choices are very important to help ease the discomfort of GERD.  Eat frequent, small meals instead of three large meals each day. Eat your meals slowly, in a relaxed setting. Avoid bending over or lying down until 2-3 hours after eating.  Limit high-fat foods such as fatty meat or fried foods. This information is not intended to replace advice given to you by your health care provider. Make sure you discuss  any questions you have with your health care provider. Document Released: 06/02/2005 Document Revised: 06/03/2016 Document Reviewed: 06/03/2016 Elsevier Interactive Patient Education  [employer].

## 2018-02-22 NOTE — Progress Notes (Signed)
  Subjective:    Patient ID: [name], female    DOB: August 08, 1950, 67 y.o.   MRN: [id number]  HPI   This very nice 67 yo MBF with hx/o Labile HTN, GERD & IBS presents now apparently off of her Dexilant and c/o upper EG discomfort & GE reflux sx's. She also has hx/o IBS-C with some improvement in Linzess, but desires to stop it due to it's expense. Denies dysphagia, Nor emesis. No diarrhea or blood in BM's.   Medication Sig  .  MAALOX/MYLANTA susp Take 30 mLs by mouth every 6 hours as needed for indigestion or heartburn.  Marland Kitchen aspirin 81 MG  Take  daily.  . cetirizine  10 MG  Take  daily as needed for allergies.  . Vit D 5000 UNITS  Take 5,000 Units by mouth daily.  Marland Kitchen PREMARIN vag crm Place 1 Applicatorful vaginally daily.  Marland Kitchen ezetimibe  10 MG  Take 1 tablet (10 mg total) by mouth daily.  Marland Kitchen FLAXSEED OIL Take by mouth daily.  Marland Kitchen FLONASE nasal spray Place 2 sprays into both nostrils daily.  Marland Kitchen LINZESS 72 MCG cap Take 72 mcg by mouth daily.  . Lutein 20 MG  Take by mouth.  . MultiVit w/Min Take 1 tablet by mouth daily.  . Omega-3 FISH OIL ) Take by mouth daily.  Marland Kitchen ALIGN 4 MG CAPS Take by mouth daily.  . ranitidine  300 MG tablet Take 300 mg by mouth daily as needed for heartburn.  . RESTASIS 0.05 % oph  instill 1 drop into both eyes twice a day  . DEXILANT 60 MG cap Take 60 mg by mouth daily.   Allergies  Allergen Reactions  . Bactrim [Sulfamethoxazole-Trimethoprim] Other (See Comments)    Patient preference to take medication without sulfa   Past Medical History:  Diagnosis Date  . Arthritis    cervical and lumbar  . Depression   . GERD (gastroesophageal reflux disease)   . Hyperlipidemia   . IBS (irritable bowel syndrome)   . Prediabetes   . Vitamin D deficiency    Past Surgical History:  Procedure Laterality Date  . CARDIAC CATHETERIZATION  2011   normal (Dr. Terrence Dupont)  . RADIOACTIVE SEED GUIDED EXCISIONAL BREAST BIOPSY Right 03/11/2016   Procedure: RIGHT RADIOACTIVE SEED  GUIDED EXCISIONAL BREAST BIOPSY;  Surgeon: Rolm Bookbinder, MD;  Location: [facility];  Service: General;  Laterality: Right;   Review of Systems    10 point systems review negative except as above.    Objective:   Physical Exam  BP 104/66   Pulse 68   Temp 97.7 F (36.5 C)   Resp 16   Ht 5\' 5"  (1.651 m)   Wt 144 lb 3.2 oz (65.4 kg)   BMI 24.00 kg/m   HEENT - WNL. Neck - supple.  Chest - Clear equal BS. Cor - Nl HS. RRR w/o sig m. PP 1(+). No edema. Abd - Soft w/sl EG tenderness w/o RB & no masses or  organomegaly MS- FROM w/o deformities.  Gait Nl. Neuro -  Nl w/o focal abnormalities.    Assessment & Plan:   1. Gastroesophageal reflux disease  2. Irritable bowel syndrome with constipation  - Advised restart Dexilant, Stop her daily banana, & take her Maalox or Phillips MOM 1 oz qhs.

## 2018-03-02 ENCOUNTER — Other Ambulatory Visit: Payer: Self-pay

## 2018-03-02 DIAGNOSIS — Z1212 Encounter for screening for malignant neoplasm of rectum: Principal | ICD-10-CM

## 2018-03-02 DIAGNOSIS — Z1211 Encounter for screening for malignant neoplasm of colon: Secondary | ICD-10-CM

## 2018-03-02 LAB — POC HEMOCCULT BLD/STL (HOME/3-CARD/SCREEN)
Card #3 Fecal Occult Blood, POC: NEGATIVE
FECAL OCCULT BLD: NEGATIVE
FECAL OCCULT BLD: NEGATIVE

## 2018-03-24 NOTE — Progress Notes (Signed)
Assessment and Plan:  [name] was seen today for neck pain and adenopathy.  Diagnoses and all orders for this visit:  Symptomatic bradycardia/Frequent unifocal PVCs/Bigeminy Pulse noted 31-38; Not on rate controlling drugs, she is stable today and able to ambulate, driven by husband EKG from 01/28/2018 showing quadrigeminy, bigeminy today, has had recent fatigue, dizziness, chest pressure yesterday Discussed with Dr. Melford Aase and will refer urgently to cardiology to be seen tomorrow AM Strong ER precautions given, for any chest pain, shortness of breath, nausea, dizziness, severe HA, changes vision/speech -     EKG -     Ambulatory referral to Cardiology  Neck pain without injury Hold medications and do not initiated until after evaluation/recommendations by cardiology Unclear origin, discussed possible TMJ, information provided on AVS Try NSAID/flexeril x 2 weeks, notify office if not improving, will get imaging vs consider referral for PT/dry needling -     tizanidine (ZANAFLEX) 2 MG capsule; Take 1 capsule (2 mg total) by mouth 3 (three) times daily. -     meloxicam (MOBIC) 15 MG tablet; Take one daily with food for 2 weeks, can take with tylenol, can not take with aleve, iburpofen, then as needed daily for pain   Further disposition pending results of labs. Discussed med's effects and SE's.   Over 30 minutes of exam, counseling, chart review, and critical decision making was performed.   Future Appointments  Date Time Provider [place]  04/07/2018  9:30 AM Fontaine, Belinda Block, MD [name]  04/09/2018  8:40 AM Elouise Munroe, MD [facility]  05/04/2018  4:30 PM Liane Comber, NP [facility] None  08/10/2018  4:30 PM Unk Pinto, MD [facility] None  02/17/2019  9:00 AM Unk Pinto, MD [facility] None    ------------------------------------------------------------------------------------------------------------------   HPI BP (!) 90/58   Pulse (!)  38   Temp (!) 96.4 F (35.8 C)   Ht 5\' 5"  (1.651 m)   Wt 144 lb (65.3 kg)   SpO2 99%   BMI 23.96 kg/m   67 y.o.female with hx of cervical arthritis presents for evaluation of ear/lateral neck pain; reports aching/tenderness in submandibular area, TMJ joint, lateral neck musculature, worse with chewing for several weeks. She has not tried any interventions. She also reports she has been having some mild but new headaches over the past several months. She has not tried any particular interventions.   We noted her pulse and oxygen saturations today; she does admit to having experienced some chest pressure yesterday and has been somewhat fatigued/dizzy recently. While in office today, palpable pulse is noted varying between 31-38 while in office, down from typical 60s. On review, EKG at recent CPE in 01/2018 demonstrated pulse 46 and frequent PVCs in unifocal quadrigeminy. EKG in office today demonstrates HR of 68 and runs of bigeminy. She is not on any BP meds or rate controlling agents.    Past Medical History:  Diagnosis Date  . Arthritis    cervical and lumbar  . Depression   . GERD (gastroesophageal reflux disease)   . Hyperlipidemia   . IBS (irritable bowel syndrome)   . Prediabetes   . Vitamin D deficiency      Allergies  Allergen Reactions  . Bactrim [Sulfamethoxazole-Trimethoprim] Other (See Comments)    Patient preference to take medication without sulfa    Current Outpatient Medications on File Prior to Visit  Medication Sig  . amoxicillin-clavulanate (AUGMENTIN) 500-125 MG tablet Take 1 tablet by mouth 2 (two) times daily.  Marland Kitchen aspirin 81  MG tablet Take 81 mg by mouth daily.  . cetirizine (ZYRTEC) 10 MG tablet Take 10 mg by mouth daily as needed for allergies.  . Cholecalciferol (D-3-5) 5000 UNITS capsule Take 5,000 Units by mouth daily.  Marland Kitchen ezetimibe (ZETIA) 10 MG tablet Take 1 tablet (10 mg total) by mouth daily.  . Flaxseed, Linseed, (FLAXSEED OIL PO) Take by mouth  daily.  . fluticasone (FLONASE) 50 MCG/ACT nasal spray Place 2 sprays into both nostrils daily.  . Lutein 20 MG TABS Take by mouth.  . Multiple Vitamins-Minerals (WOMENS MULTI VITAMIN & MINERAL PO) Take 1 tablet by mouth daily.  . Omega-3 Fatty Acids (FISH OIL PO) Take by mouth daily.  . Probiotic Product (ALIGN) 4 MG CAPS Take by mouth daily.  . RESTASIS 0.05 % ophthalmic emulsion instill 1 drop into both eyes twice a day  . alum & mag hydroxide-simeth (MAALOX/MYLANTA) 200-200-20 MG/5ML suspension Take 30 mLs by mouth every 6 (six) hours as needed for indigestion or heartburn.  . conjugated estrogens (PREMARIN) vaginal cream Place 1 Applicatorful vaginally daily. (Patient not taking: Reported on 03/25/2018)   No current facility-administered medications on file prior to visit.     ROS: all negative except above.   Physical Exam:  BP (!) 90/58   Pulse (!) 38   Temp (!) 96.4 F (35.8 C)   Ht 5\' 5"  (1.651 m)   Wt 144 lb (65.3 kg)   SpO2 99%   BMI 23.96 kg/m   General Appearance: Well nourished, in no apparent distress. Eyes: PERRLA, EOMs, conjunctiva no swelling or erythema Sinuses: No Frontal/maxillary tenderness ENT/Mouth: Ext aud canals clear, TMs without erythema, bulging. No erythema, swelling, or exudate on post pharynx.  Tonsils not swollen or erythematous. Hearing normal. She does have TMJ tenderness. No sublingual/parotid gland enlargement.   Neck: Supple, thyroid normal.  Respiratory: Respiratory effort normal, BS equal bilaterally without rales, rhonchi, wheezing or stridor.  Cardio: Bradycardic, irregular with no audible murmurs. Intact peripheral pulses without edema.  Abdomen: Soft, + BS.  Non tender, no guarding, rebound, hernias, masses. Lymphatics: Non tender without lymphadenopathy.  Musculoskeletal: Full ROM, 5/5 strength, normal gait. No popping/grinding in TMJ, she is vaguely tender over sternocleidomastoids bilaterally and anteriorly without palpable  abnormality.   Skin: Warm, dry without rashes, lesions, ecchymosis.  Neuro: Cranial nerves intact. Normal muscle tone, no cerebellar symptoms. Sensation intact.  Psych: Awake and oriented X 3, normal affect, Insight and Judgment appropriate.     Izora Ribas, NP 5:32 PM [facility]

## 2018-03-25 ENCOUNTER — Encounter: Payer: Self-pay | Admitting: Adult Health

## 2018-03-25 ENCOUNTER — Other Ambulatory Visit: Payer: Self-pay | Admitting: Adult Health

## 2018-03-25 ENCOUNTER — Ambulatory Visit: Payer: Medicare Other | Admitting: Adult Health

## 2018-03-25 VITALS — BP 90/58 | HR 38 | Temp 96.4°F | Ht 65.0 in | Wt 144.0 lb

## 2018-03-25 DIAGNOSIS — M542 Cervicalgia: Secondary | ICD-10-CM

## 2018-03-25 DIAGNOSIS — R001 Bradycardia, unspecified: Secondary | ICD-10-CM | POA: Diagnosis not present

## 2018-03-25 DIAGNOSIS — I498 Other specified cardiac arrhythmias: Secondary | ICD-10-CM | POA: Insufficient documentation

## 2018-03-25 DIAGNOSIS — I499 Cardiac arrhythmia, unspecified: Secondary | ICD-10-CM | POA: Diagnosis not present

## 2018-03-25 DIAGNOSIS — I493 Ventricular premature depolarization: Secondary | ICD-10-CM

## 2018-03-25 MED ORDER — TIZANIDINE HCL 2 MG PO CAPS: 2.0000 mg | ORAL_CAPSULE | Freq: Three times a day (TID) | ORAL | 0 refills | Status: DC

## 2018-03-25 MED ORDER — MELOXICAM 15 MG PO TABS: ORAL_TABLET | ORAL | 1 refills | Status: DC

## 2018-03-25 NOTE — Patient Instructions (Addendum)
Bradycardia, Adult Bradycardia is a slower-than-normal heartbeat. A normal resting heart rate for an adult ranges from 60 to 100 beats per minute. With bradycardia, the resting heart rate is less than 60 beats per minute. Bradycardia can prevent enough oxygen from reaching certain areas of your body when you are active. It can be serious if it keeps enough oxygen from reaching your brain and other parts of your body. Bradycardia is not a problem for everyone. For some healthy adults, a slow resting heart rate is normal. What are the causes? This condition may be caused by:  A problem with the heart, including: ? A problem with the heart's electrical system, such as a heart block. ? A problem with the heart's natural pacemaker (sinus node). ? Heart disease. ? A heart attack. ? Heart damage. ? A heart infection. ? A heart condition that is present at birth (congenital heart defect).  Certain medicines that treat heart conditions.  Certain conditions, such as hypothyroidism and obstructive sleep apnea.  Problems with the balance of chemicals and other substances, like potassium, in the blood.  What increases the risk? This condition is more likely to develop in adults who:  Are age 53 or older.  Have high blood pressure (hypertension), high cholesterol (hyperlipidemia), or diabetes.  Drink heavily, use tobacco or nicotine products, or use drugs.  Are stressed.  What are the signs or symptoms? Symptoms of this condition include:  Light-headedness.  Feeling faint or fainting.  Fatigue and weakness.  Shortness of breath.  Chest pain (angina).  Drowsiness.  Confusion.  Dizziness.  How is this diagnosed? This condition may be diagnosed based on:  Your symptoms.  Your medical history.  A physical exam.  During the exam, your health care provider will listen to your heartbeat and check your pulse. To confirm the diagnosis, your health care provider may order  tests, such as:  Blood tests.  An electrocardiogram (ECG). This test records the heart's electrical activity. The test can show how fast your heart is beating and whether the heartbeat is steady.  A test in which you wear a portable device (event recorder or Holter monitor) to record your heart's electrical activity while you go about your day.  Anexercise test.  How is this treated? Treatment for this condition depends on the cause of the condition and how severe your symptoms are. Treatment may involve:  Treatment of the underlying condition.  Changing your medicines or how much medicine you take.  Having a small, battery-operated device called a pacemaker implanted under the skin. When bradycardia occurs, this device can be used to increase your heart rate and help your heart to beat in a regular rhythm.  Follow these instructions at home: Lifestyle   Manage any health conditions that contribute to bradycardia as told by your health care provider.  Follow a heart-healthy diet. A nutrition specialist (dietitian) can help to educate you about healthy food options and changes.  Follow an exercise program that is approved by your health care provider.  Maintain a healthy weight.  Try to reduce or manage your stress, such as with yoga or meditation. If you need help reducing stress, ask your health care provider.  Do not use use any products that contain nicotine or tobacco, such as cigarettes and e-cigarettes. If you need help quitting, ask your health care provider.  Do not use illegal drugs.  Limit alcohol intake to no more than 1 drink per day for nonpregnant women and 2 drinks  per day for men. One drink equals 12 oz of beer, 5 oz of wine, or 1 oz of hard liquor. General instructions  Take over-the-counter and prescription medicines only as told by your health care provider.  Keep all follow-up visits as directed by your health care provider. This is important. How is  this prevented? In some cases, bradycardia may be prevented by:  Treating underlying medical problems.  Stopping behaviors or medicines that can trigger the condition.  Contact a health care provider if:  You feel light-headed or dizzy.  You almost faint.  You feel weak or are easily fatigued during physical activity.  You experience confusion or have memory problems. Get help right away if:  You faint.  You have an irregular heartbeat (palpitations).  You have chest pain.  You have trouble breathing. This information is not intended to replace advice given to you by your health care provider. Make sure you discuss any questions you have with your health care provider. Document Released: 02/22/2002 Document Revised: 01/29/2016 Document Reviewed: 11/22/2015 Elsevier Interactive Patient Education  2017 [employer].     What is the TMJ? The temporomandibular (tem-PUH-ro-man-DIB-yoo-ler) joint, or the TMJ, connects the upper and lower jawbones. This joint allows the jaw to open wide and move back and forth when you chew, talk, or yawn.There are also several muscles that help this joint move. There can be muscle tightness and pain in the muscle that can cause several symptoms.  What causes TMJ pain? There are many causes of TMJ pain. Repeated chewing (for example, chewing gum) and clenching your teeth can cause pain in the joint. Some TMJ pain has no obvious cause. What can I do to ease the pain? There are many things you can do to help your pain get better. When you have pain:  Eat soft foods and stay away from chewy foods (for example, taffy) Try to use both sides of your mouth to chew Don't chew gum Massage Don't open your mouth wide (for example, during yawning or singing) Don't bite your cheeks or fingernails Lower your amount of stress and worry Applying a warm, damp washcloth to the joint may help. Over-the-counter pain medicines such as ibuprofen (one brand: Advil)  or acetaminophen (one brand: Tylenol) might also help. Do not use these medicines if you are allergic to them or if your doctor told you not to use them. How can I stop the pain from coming back? When your pain is better, you can do these exercises to make your muscles stronger and to keep the pain from coming back:  Resisted mouth opening: Place your thumb or two fingers under your chin and open your mouth slowly, pushing up lightly on your chin with your thumb. Hold for three to six seconds. Close your mouth slowly. Resisted mouth closing: Place your thumbs under your chin and your two index fingers on the ridge between your mouth and the bottom of your chin. Push down lightly on your chin as you close your mouth. Tongue up: Slowly open and close your mouth while keeping the tongue touching the roof of the mouth. Side-to-side jaw movement: Place an object about one fourth of an inch thick (for example, two tongue depressors) between your front teeth. Slowly move your jaw from side to side. Increase the thickness of the object as the exercise becomes easier Forward jaw movement: Place an object about one fourth of an inch thick between your front teeth and move the bottom jaw forward so  that the bottom teeth are in front of the top teeth. Increase the thickness of the object as the exercise becomes easier. These exercises should not be painful. If it hurts to do these exercises, stop doing them and talk to your family doctor.    Tizanidine tablets or capsules What is this medicine? TIZANIDINE (tye ZAN i deen) helps to relieve muscle spasms. It may be used to help in the treatment of multiple sclerosis and spinal cord injury. This medicine may be used for other purposes; ask your health care provider or pharmacist if you have questions. COMMON BRAND NAME(S): Zanaflex What should I tell my health care provider before I take this medicine? They need to know if you have any of these  conditions: -kidney disease -liver disease -low blood pressure -mental disorder -an unusual or allergic reaction to tizanidine, other medicines, lactose (tablets only), foods, dyes, or preservatives -pregnant or trying to get pregnant -breast-feeding How should I use this medicine? Take this medicine by mouth with a full glass of water. Take this medicine on an empty stomach, at least 30 minutes before or 2 hours after food. Do not take with food unless you talk with your doctor. Follow the directions on the prescription label. Take your medicine at regular intervals. Do not take your medicine more often than directed. Do not stop taking except on your doctor's advice. Suddenly stopping the medicine can be very dangerous. Talk to your pediatrician regarding the use of this medicine in children. Patients over 61 years old may have a stronger reaction and need a smaller dose. Overdosage: If you think you have taken too much of this medicine contact a poison control center or emergency room at once. NOTE: This medicine is only for you. Do not share this medicine with others. What if I miss a dose? If you miss a dose, take it as soon as you can. If it is almost time for your next dose, take only that dose. Do not take double or extra doses. What may interact with this medicine? Do not take this medicine with any of the following medications: -ciprofloxacin -cisapride -dofetilide -dronedarone -fluvoxamine -narcotic medicines for cough -pimozide -thiabendazole -thioridazine -ziprasidone This medicine may also interact with the following medications: -acyclovir -alcohol -antihistamines for allergy, cough and cold -baclofen -certain antibiotics like levofloxacin, ofloxacin -certain medicines for anxiety or sleep -certain medicines for blood pressure, heart disease, irregular heart beat -certain medicines for depression like amitriptyline, fluoxetine, sertraline -certain medicines for  seizures like phenobarbital, primidone -certain medicines for stomach problems like cimetidine, famotidine -female hormones, like estrogens or progestins and birth control pills, patches, rings, or injections -general anesthetics like halothane, isoflurane, methoxyflurane, propofol -local anesthetics like lidocaine, pramoxine, tetracaine -medicines that relax muscles for surgery -narcotic medicines for pain -other medicines that prolong the QT interval (cause an abnormal heart rhythm) -phenothiazines like chlorpromazine, mesoridazine, prochlorperazine -ticlopidine -zileuton This list may not describe all possible interactions. Give your health care provider a list of all the medicines, herbs, non-prescription drugs, or dietary supplements you use. Also tell them if you smoke, drink alcohol, or use illegal drugs. Some items may interact with your medicine. What should I watch for while using this medicine? Tell your doctor or health care professional if your symptoms do not start to get better or if they get worse. You may get drowsy or dizzy. Do not drive, use machinery, or do anything that needs mental alertness until you know how this medicine affects you. Do not stand  or sit up quickly, especially if you are an older patient. This reduces the risk of dizzy or fainting spells. Alcohol may interfere with the effect of this medicine. Avoid alcoholic drinks. If you are taking another medicine that also causes drowsiness, you may have more side effects. Give your health care provider a list of all medicines you use. Your doctor will tell you how much medicine to take. Do not take more medicine than directed. Call emergency for help if you have problems breathing or unusual sleepiness. Your mouth may get dry. Chewing sugarless gum or sucking hard candy, and drinking plenty of water may help. Contact your doctor if the problem does not go away or is severe. What side effects may I notice from receiving  this medicine? Side effects that you should report to your doctor or health care professional as soon as possible: -allergic reactions like skin rash, itching or hives, swelling of the face, lips, or tongue -breathing problems -hallucinations -signs and symptoms of liver injury like dark yellow or brown urine; general ill feeling or flu-like symptoms; light-colored stools; loss of appetite; nausea; right upper quadrant belly pain; unusually weak or tired; yellowing of the eyes or skin -signs and symptoms of low blood pressure like dizziness; feeling faint or lightheaded, falls; unusually weak or tired -unusually slow heartbeat -unusually weak or tired Side effects that usually do not require medical attention (report to your doctor or health care professional if they continue or are bothersome): -blurred vision -constipation -dizziness -dry mouth -tiredness This list may not describe all possible side effects. Call your doctor for medical advice about side effects. You may report side effects to FDA at 1-800-FDA-1088. Where should I keep my medicine? Keep out of the reach of children. Store at room temperature between 15 and 30 degrees C (59 and 86 degrees F). Throw away any unused medicine after the expiration date. NOTE: This sheet is a summary. It may not cover all possible information. If you have questions about this medicine, talk to your doctor, pharmacist, or health care provider.  2018 Elsevier/Gold Standard (2015-03-13 13:52:12)    Meloxicam tablets What is this medicine? MELOXICAM (mel OX i cam) is a non-steroidal anti-inflammatory drug (NSAID). It is used to reduce swelling and to treat pain. It may be used for osteoarthritis, rheumatoid arthritis, or juvenile rheumatoid arthritis. This medicine may be used for other purposes; ask your health care provider or pharmacist if you have questions. COMMON BRAND NAME(S): Mobic What should I tell my health care provider before I  take this medicine? They need to know if you have any of these conditions: -bleeding disorders -cigarette smoker -coronary artery bypass graft (CABG) surgery within the past 2 weeks -drink more than 3 alcohol-containing drinks per day -heart disease -high blood pressure -history of stomach bleeding -kidney disease -liver disease -lung or breathing disease, like asthma -stomach or intestine problems -an unusual or allergic reaction to meloxicam, aspirin, other NSAIDs, other medicines, foods, dyes, or preservatives -pregnant or trying to get pregnant -breast-feeding How should I use this medicine? Take this medicine by mouth with a full glass of water. Follow the directions on the prescription label. You can take it with or without food. If it upsets your stomach, take it with food. Take your medicine at regular intervals. Do not take it more often than directed. Do not stop taking except on your doctor's advice. A special MedGuide will be given to you by the pharmacist with each prescription and refill. Be  sure to read this information carefully each time. Talk to your pediatrician regarding the use of this medicine in children. While this drug may be prescribed for selected conditions, precautions do apply. Patients over 49 years old may have a stronger reaction and need a smaller dose. Overdosage: If you think you have taken too much of this medicine contact a poison control center or emergency room at once. NOTE: This medicine is only for you. Do not share this medicine with others. What if I miss a dose? If you miss a dose, take it as soon as you can. If it is almost time for your next dose, take only that dose. Do not take double or extra doses. What may interact with this medicine? Do not take this medicine with any of the following medications: -cidofovir -ketorolac This medicine may also interact with the following medications: -aspirin and aspirin-like medicines -certain  medicines for blood pressure, heart disease, irregular heart beat -certain medicines for depression, anxiety, or psychotic disturbances -certain medicines that treat or prevent blood clots like warfarin, enoxaparin, dalteparin, apixaban, dabigatran, rivaroxaban -cyclosporine -digoxin -diuretics -methotrexate -other NSAIDs, medicines for pain and inflammation, like ibuprofen and naproxen -pemetrexed This list may not describe all possible interactions. Give your health care provider a list of all the medicines, herbs, non-prescription drugs, or dietary supplements you use. Also tell them if you smoke, drink alcohol, or use illegal drugs. Some items may interact with your medicine. What should I watch for while using this medicine? Tell your doctor or healthcare professional if your symptoms do not start to get better or if they get worse. Do not take other medicines that contain aspirin, ibuprofen, or naproxen with this medicine. Side effects such as stomach upset, nausea, or ulcers may be more likely to occur. Many medicines available without a prescription should not be taken with this medicine. This medicine can cause ulcers and bleeding in the stomach and intestines at any time during treatment. This can happen with no warning and may cause death. There is increased risk with taking this medicine for a long time. Smoking, drinking alcohol, older age, and poor health can also increase risks. Call your doctor right away if you have stomach pain or blood in your vomit or stool. This medicine does not prevent heart attack or stroke. In fact, this medicine may increase the chance of a heart attack or stroke. The chance may increase with longer use of this medicine and in people who have heart disease. If you take aspirin to prevent heart attack or stroke, talk with your doctor or health care professional. What side effects may I notice from receiving this medicine? Side effects that you should report  to your doctor or health care professional as soon as possible: -allergic reactions like skin rash, itching or hives, swelling of the face, lips, or tongue -nausea, vomiting -signs and symptoms of a blood clot such as breathing problems; changes in vision; chest pain; severe, sudden headache; pain, swelling, warmth in the leg; trouble speaking; sudden numbness or weakness of the face, arm, or leg -signs and symptoms of bleeding such as bloody or black, tarry stools; red or dark-brown urine; spitting up blood or brown material that looks like coffee grounds; red spots on the skin; unusual bruising or bleeding from the eye, gums, or nose -signs and symptoms of liver injury like dark yellow or brown urine; general ill feeling or flu-like symptoms; light-colored stools; loss of appetite; nausea; right upper belly pain; unusually weak  or tired; yellowing of the eyes or skin -signs and symptoms of stroke like changes in vision; confusion; trouble speaking or understanding; severe headaches; sudden numbness or weakness of the face, arm, or leg; trouble walking; dizziness; loss of balance or coordination Side effects that usually do not require medical attention (report to your doctor or health care professional if they continue or are bothersome): -constipation -diarrhea -gas This list may not describe all possible side effects. Call your doctor for medical advice about side effects. You may report side effects to FDA at 1-800-FDA-1088. Where should I keep my medicine? Keep out of the reach of children. Store at room temperature between 15 and 30 degrees C (59 and 86 degrees F). Throw away any unused medicine after the expiration date. NOTE: This sheet is a summary. It may not cover all possible information. If you have questions about this medicine, talk to your doctor, pharmacist, or health care provider.  2018 Elsevier/Gold Standard (2015-07-04 19:28:16)

## 2018-03-26 ENCOUNTER — Encounter: Payer: Self-pay | Admitting: Cardiology

## 2018-03-28 NOTE — Progress Notes (Signed)
Cardiology Office Note   Date:  03/29/2018   ID:  [name], DOB 04/12/1951, MRN [id number]  PCP:  Unk Pinto, MD  Cardiologist:   Artavius Stearns Martinique, MD   Chief Complaint  Patient presents with  . Palpitations  . Bradycardia      History of Present Illness: Geovana Gebel Legros is a 67 y.o. female who is seen at the request of Liane Comber NP for evaluation of bradycardia and PVCs. She was evaluated by cardiology in 2011. Echo showed normal LV function with mild MR and moderate TR. Cardiac cath showed normal coronary arteries. She also had a ETT in 2015 that was normal.  Recently seen by primary care. Was noted to have a slow pulse by pulse oximetry down into the 30s. Ecg showed NSR with frequent PVCs in a pattern of bigeminy. She denies any specific cardiac complaints. She does tire easily. No SOB, angina, dizziness, or syncope. Sometimes feels "butterflies" in her chest especially at rest. No racing.     Past Medical History:  Diagnosis Date  . Arthritis    cervical and lumbar  . Depression   . GERD (gastroesophageal reflux disease)   . Hyperlipidemia   . IBS (irritable bowel syndrome)   . Prediabetes   . Vitamin D deficiency     Past Surgical History:  Procedure Laterality Date  . CARDIAC CATHETERIZATION  2011   normal (Dr. Terrence Dupont)  . RADIOACTIVE SEED GUIDED EXCISIONAL BREAST BIOPSY Right 03/11/2016   Procedure: RIGHT RADIOACTIVE SEED GUIDED EXCISIONAL BREAST BIOPSY;  Surgeon: Rolm Bookbinder, MD;  Location: [facility];  Service: General;  Laterality: Right;     Current Outpatient Medications  Medication Sig Dispense Refill  . alum & mag hydroxide-simeth (MAALOX/MYLANTA) 200-200-20 MG/5ML suspension Take 30 mLs by mouth every 6 (six) hours as needed for indigestion or heartburn.    Marland Kitchen aspirin 81 MG tablet Take 81 mg by mouth daily.    . cetirizine (ZYRTEC) 10 MG tablet Take 10 mg by mouth daily as needed for allergies.    . Cholecalciferol  (D-3-5) 5000 UNITS capsule Take 5,000 Units by mouth daily.    Marland Kitchen conjugated estrogens (PREMARIN) vaginal cream Place 1 Applicatorful vaginally daily. 42.5 g 12  . ezetimibe (ZETIA) 10 MG tablet Take 1 tablet (10 mg total) by mouth daily. 90 tablet 1  . Flaxseed, Linseed, (FLAXSEED OIL PO) Take by mouth daily.    . fluticasone (FLONASE) 50 MCG/ACT nasal spray Place 2 sprays into both nostrils daily. 16 g 0  . Lutein 20 MG TABS Take by mouth.    . meloxicam (MOBIC) 15 MG tablet Take one daily with food for 2 weeks, can take with tylenol, can not take with aleve, iburpofen, then as needed daily for pain 30 tablet 1  . Multiple Vitamins-Minerals (WOMENS MULTI VITAMIN & MINERAL PO) Take 1 tablet by mouth daily.    . Omega-3 Fatty Acids (FISH OIL PO) Take by mouth daily.    . Probiotic Product (ALIGN) 4 MG CAPS Take by mouth daily.    . RESTASIS 0.05 % ophthalmic emulsion instill 1 drop into both eyes twice a day  0  . tizanidine (ZANAFLEX) 2 MG capsule Take 1 capsule (2 mg total) by mouth 3 (three) times daily. 60 capsule 0   No current facility-administered medications for this visit.     Allergies:   Bactrim [sulfamethoxazole-trimethoprim]    Social History:  The patient  reports that she has never smoked. She  has never used smokeless tobacco. She reports that she drinks alcohol. She reports that she does not use drugs.   Family History:  The patient's family history includes Cancer in her maternal grandmother; Diabetes in her father; Heart failure in her father.    ROS:  Please see the history of present illness.   Otherwise, review of systems are positive for none.   All other systems are reviewed and negative.    PHYSICAL EXAM: VS:  BP 110/60 (BP Location: Left Arm, Patient Position: Sitting, Cuff Size: Normal)   Pulse 60   Ht 5\' 5"  (1.651 m)   Wt 143 lb 6.4 oz (65 kg)   BMI 23.86 kg/m  , BMI Body mass index is 23.86 kg/m. GEN: Well nourished, well developed, in no acute distress    HEENT: normal  Neck: no JVD, carotid bruits, or masses Cardiac: RRR with occ extrasystole; no murmurs, rubs, or gallops,no edema  Respiratory:  clear to auscultation bilaterally, normal work of breathing GI: soft, nontender, nondistended, + BS MS: no deformity or atrophy  Skin: warm and dry, no rash Neuro:  Strength and sensation are intact Psych: euthymic mood, full affect   EKG:  EKG is not ordered today. The ekg ordered 03/25/18 demonstrates NSR with frequent unifocal PVCs. Otherwise normal. HR 68. I have personally reviewed and interpreted this study.    Recent Labs: 01/28/2018: ALT 13; BUN 11; Creat 0.75; Hemoglobin 12.8; Magnesium 2.1; Platelets 175; Potassium 4.1; Sodium 141; TSH 1.07    Lipid Panel    Component Value Date/Time   CHOL 237 (H) 01/28/2018 1029   TRIG 61 01/28/2018 1029   HDL 74 01/28/2018 1029   CHOLHDL 3.2 01/28/2018 1029   VLDL 10 01/15/2017 0950   LDLCALC 147 (H) 01/28/2018 1029      Wt Readings from Last 3 Encounters:  03/29/18 143 lb 6.4 oz (65 kg)  03/25/18 144 lb (65.3 kg)  02/22/18 144 lb 3.2 oz (65.4 kg)      Other studies Reviewed: Additional studies/ records that were reviewed today include: see above   ASSESSMENT AND PLAN:  1.  PVCs. She does not have bradycardia. It is just that her PVCs are not registered on her pulse check. She is minimally symptomatic with the PVCs. Labs are normal. She avoids caffeine. Prior ischemic evaluation with cardiac cath in 2011 and ETT in 2015 were normal. In reviewing her Ecgs she has had PVCs since July 2018. We will update Echo. If LV function is good then no further work up or treatment of PVCs required.     Current medicines are reviewed at length with the patient today.  The patient does not have concerns regarding medicines.  The following changes have been made:  no change  Labs/ tests ordered today include:   Orders Placed This Encounter  Procedures  . ECHOCARDIOGRAM COMPLETE      Disposition:   FU TBD based on Echo results.   Signed, Jaelyn Cloninger Martinique, MD  03/29/2018 4:43 PM    [facility] [address] [place], [place], [address] Phone [phone], Fax [phone]

## 2018-03-29 ENCOUNTER — Ambulatory Visit: Payer: Medicare Other | Admitting: Cardiology

## 2018-03-29 ENCOUNTER — Encounter: Payer: Self-pay | Admitting: Cardiology

## 2018-03-29 VITALS — BP 110/60 | HR 60 | Ht 65.0 in | Wt 143.4 lb

## 2018-03-29 DIAGNOSIS — I493 Ventricular premature depolarization: Secondary | ICD-10-CM

## 2018-03-29 NOTE — Addendum Note (Signed)
Addended by: Izora Ribas on: 03/29/2018 07:51 AM   Modules accepted: Orders

## 2018-03-29 NOTE — Patient Instructions (Signed)
We will schedule you for an Echocardiogram   

## 2018-03-31 NOTE — Progress Notes (Addendum)
  Subjective:    Patient ID: [name], female    DOB: Jun 16, 1951, 67 y.o.   MRN: [id number]  HPI    This very nice 67 yo MBF returning with a 2 month hx/o vague upper abdominal pain which is not typically associated with food intake and usually awakens het about 2-2:30 am and keeps her awake until about 5-5:30 am. Intensity is 6/10. Possibly occasional mild nausea & no emesia . Does have hx/o GERD and Dexilant not helping. Also, has hx/o IBS.  No Heartburn / reflux sx's. No particular triggers or alleviating factors.   Medication Sig  . MAALOX/MYLANTA  MG/5ML suspe Take 30 mLs  every 6  hrs as needed for indigestion   . aspirin 81 MG tablet Take daily.  . cetirizine ) 10 MG tablet Take  daily as needed for allergies.  . Vit D 5000 UNITS capsule Take 5,000 Units by mouth daily.  Marland Kitchen PREMARIN vag crm Place 1 Applicatorful vaginally daily.  Marland Kitchen ezetimibe10 MG tablet Take 1 tablet (10 mg total) by mouth daily.  Marland Kitchen FLAXSEED OIL Take by mouth daily.  Marland Kitchen FLONASE 50  nasal spray Place 2 sprays into both nostrils daily.  . Lutein 20 MG TABS Take by mouth.  . meloxicam(MOBIC) 15 MG tablet Take  as needed daily for pain  . Multiple Vitamins-Minerals Take 1 tablet by mouth daily.  . Omega-3 FISH OIL Take by mouth daily.  Marland Kitchen ALIGN 4 MG CAPS Take by mouth daily.  . RESTASIS 0.05 % ophth  instill 1 drop into both eyes twice a day  . tizanidine  2 MG capsule Take 1 capsule (2 mg total) by mouth 3 (three) times daily.   Allergies  Allergen Reactions  . Bactrim [Sulfamethoxazole-Trimethoprim] Other (See Comments)    Patient preference to take medication without sulfa   Past Medical History:  Diagnosis Date  . Arthritis    cervical and lumbar  . Depression   . GERD (gastroesophageal reflux disease)   . Hyperlipidemia   . IBS (irritable bowel syndrome)   . Prediabetes   . Vitamin D deficiency    Review of Systems    10 point systems review negative except as above.    Objective:   Physical  Exam  BP 102/68   Pulse (!) 52   Temp (!) 97.5 F (36.4 C)   Resp 16   Ht 5\' 5"  (1.651 m)   Wt 142 lb 12.8 oz (64.8 kg)   BMI 23.76 kg/m   HEENT - WNL. Neck - supple.  Chest - Clear equal BS. Cor - Nl HS. RRR w/o sig MGR. PP 1(+). No edema. Abd- Soft, flat. No palpable point tenderness, guarding, masses or organomegaly. MS- FROM w/o deformities.  Gait Nl. Neuro -  Nl w/o focal abnormalities.    Assessment & Plan:   1. Generalized abdominal pain  - Amylase - CBC with Differential/Platelet - COMPLETE METABOLIC PANEL WITH GFR - Urinalysis, Routine w reflex microscopic  - CT Abdomen Pelvis W Contrast; Future      .

## 2018-04-01 ENCOUNTER — Encounter: Payer: Self-pay | Admitting: Internal Medicine

## 2018-04-01 ENCOUNTER — Ambulatory Visit: Payer: Medicare Other | Admitting: Internal Medicine

## 2018-04-01 VITALS — BP 102/68 | HR 52 | Temp 97.5°F | Resp 16 | Ht 65.0 in | Wt 142.8 lb

## 2018-04-01 DIAGNOSIS — R1084 Generalized abdominal pain: Secondary | ICD-10-CM | POA: Diagnosis not present

## 2018-04-02 ENCOUNTER — Telehealth: Payer: Self-pay | Admitting: *Deleted

## 2018-04-02 LAB — URINALYSIS, ROUTINE W REFLEX MICROSCOPIC
BILIRUBIN URINE: NEGATIVE
GLUCOSE, UA: NEGATIVE
Hgb urine dipstick: NEGATIVE
Ketones, ur: NEGATIVE
Leukocytes, UA: NEGATIVE
Nitrite: NEGATIVE
PH: 8 (ref 5.0–8.0)
PROTEIN: NEGATIVE
SPECIFIC GRAVITY, URINE: 1.016 (ref 1.001–1.03)

## 2018-04-02 LAB — COMPLETE METABOLIC PANEL WITH GFR
AG Ratio: 1.6 (calc) (ref 1.0–2.5)
ALBUMIN MSPROF: 4.2 g/dL (ref 3.6–5.1)
ALT: 16 U/L (ref 6–29)
AST: 20 U/L (ref 10–35)
Alkaline phosphatase (APISO): 80 U/L (ref 33–130)
BILIRUBIN TOTAL: 0.5 mg/dL (ref 0.2–1.2)
BUN: 13 mg/dL (ref 7–25)
CHLORIDE: 105 mmol/L (ref 98–110)
CO2: 25 mmol/L (ref 20–32)
Calcium: 9.7 mg/dL (ref 8.6–10.4)
Creat: 0.88 mg/dL (ref 0.50–0.99)
GFR, EST AFRICAN AMERICAN: 80 mL/min/{1.73_m2} (ref >=60)
GFR, EST NON AFRICAN AMERICAN: 69 mL/min/{1.73_m2} (ref >=60)
GLUCOSE: 87 mg/dL (ref 65–99)
Globulin: 2.7 g/dL (calc) (ref 1.9–3.7)
Potassium: 4.5 mmol/L (ref 3.5–5.3)
Sodium: 140 mmol/L (ref 135–146)
TOTAL PROTEIN: 6.9 g/dL (ref 6.1–8.1)

## 2018-04-02 LAB — CBC WITH DIFFERENTIAL/PLATELET
BASOS ABS: 30 {cells}/uL (ref 0–200)
Basophils Relative: 1 %
EOS ABS: 99 {cells}/uL (ref 15–500)
EOS PCT: 3.3 %
HCT: 40 % (ref 35.0–45.0)
Hemoglobin: 13.5 g/dL (ref 11.7–15.5)
LYMPHS ABS: 1347 {cells}/uL (ref 850–3900)
MCH: 28.6 pg (ref 27.0–33.0)
MCHC: 33.8 g/dL (ref 32.0–36.0)
MCV: 84.7 fL (ref 80.0–100.0)
MPV: 13.1 fL — ABNORMAL HIGH (ref 7.5–12.5)
Monocytes Relative: 10.6 %
Neutro Abs: 1206 cells/uL — ABNORMAL LOW (ref 1500–7800)
Neutrophils Relative %: 40.2 %
Platelets: 134 10*3/uL — ABNORMAL LOW (ref 140–400)
RBC: 4.72 10*6/uL (ref 3.80–5.10)
RDW: 12.7 % (ref 11.0–15.0)
TOTAL LYMPHOCYTE: 44.9 %
WBC: 3 10*3/uL — ABNORMAL LOW (ref 3.8–10.8)
WBCMIX: 318 {cells}/uL (ref 200–950)

## 2018-04-02 LAB — AMYLASE: Amylase: 60 U/L (ref 21–101)

## 2018-04-02 NOTE — Telephone Encounter (Signed)
Patient expressed concern that Zetia could be the cause of her abdominal pain.  She asked if she can stop the Zetia for now.  OK to stop for now and will decide if she should continue after her CT, per Dr Melford Aase.

## 2018-04-04 ENCOUNTER — Encounter: Payer: Self-pay | Admitting: Internal Medicine

## 2018-04-07 ENCOUNTER — Ambulatory Visit: Payer: Medicare Other | Admitting: Gynecology

## 2018-04-07 ENCOUNTER — Encounter: Payer: Self-pay | Admitting: Gynecology

## 2018-04-07 VITALS — BP 106/62 | Ht 64.0 in | Wt 145.0 lb

## 2018-04-07 DIAGNOSIS — Z01419 Encounter for gynecological examination (general) (routine) without abnormal findings: Secondary | ICD-10-CM | POA: Diagnosis not present

## 2018-04-07 DIAGNOSIS — M810 Age-related osteoporosis without current pathological fracture: Secondary | ICD-10-CM

## 2018-04-07 DIAGNOSIS — N952 Postmenopausal atrophic vaginitis: Secondary | ICD-10-CM

## 2018-04-07 NOTE — Patient Instructions (Signed)
Follow-up in 1 year for annual exam, sooner as needed. 

## 2018-04-07 NOTE — Progress Notes (Signed)
    Monque Haggar Dorow 12/19/1950 [id number]        67 y.o.  G0P0000 for annual gynecologic exam.  Without gynecologic complaints.  Currently being evaluated for abdominal cramping to her primary physician.  Past medical history,surgical history, problem list, medications, allergies, family history and social history were all reviewed and documented as reviewed in the EPIC chart.  ROS:  Performed with pertinent positives and negatives included in the history, assessment and plan.   Additional significant findings : None   Exam: [name] assistant Vitals:   04/07/18 0945  BP: 106/62  Weight: 145 lb (65.8 kg)  Height: 5\' 4"  (1.626 m)   Body mass index is 24.89 kg/m.  General appearance:  Normal affect, orientation and appearance. Skin: Grossly normal HEENT: Without gross lesions.  No cervical or supraclavicular adenopathy. Thyroid normal.  Lungs:  Clear without wheezing, rales or rhonchi Cardiac: RR, without RMG Abdominal:  Soft, nontender, without masses, guarding, rebound, organomegaly or hernia Breasts:  Examined lying and sitting without masses, retractions, discharge or axillary adenopathy. Pelvic:  Ext, BUS, Vagina: With atrophic changes  Cervix: With atrophic changes  Uterus: Anteverted, normal size, shape and contour, midline and mobile nontender   Adnexa: Without masses or tenderness    Anus and perineum: Normal   Rectovaginal: Normal sphincter tone without palpated masses or tenderness.    Assessment/Plan:  67 y.o. G0P0000 female for annual gynecologic exam.   1. Postmenopausal/atrophic genital changes.  No significant menopausal symptoms or any vaginal bleeding.  Underwent work-up last year for postmenopausal bleeding with negative sonohysterogram and biopsy.  Has had no bleeding since. 2. Colonoscopy 2017.  Repeat at their recommended interval. 3. Mammography 01/2018.  Continue with annual mammography when due.  Breast exam normal today. 4. Pap smear 2018.  No Pap  smear done today.  No history of significant abnormal Pap smears.  Plan repeat Pap smear at 3-year interval per current screening guidelines. 5. Osteoporosis.  DEXA 2019 T score -3.6.  Discussed treatment options with her primary provider and she decided against treatment.  I reviewed her increased risk of fracture as well as the issues of ongoing bone loss.  The benefits of medication to decrease her fracture risk acutely and to decrease ongoing bone loss which will decrease her fracture risk in the future was reviewed.  I strongly recommended she consider treatment.  Weightbearing exercise on a regular basis as well as adequate calcium and vitamin D intake was also discussed.  At this point the patient is not interested in treatment and will follow-up with her primary provider for ongoing bone health management. 6. Health maintenance.  Currently undergoing work-up for abdominal cramping.  She will continue to follow-up with her primary provider for this.  Follow-up GYN exam in 1 year.   Anastasio Auerbach MD, 10:18 AM 04/07/2018

## 2018-04-09 ENCOUNTER — Ambulatory Visit: Payer: Medicare Other | Admitting: Internal Medicine

## 2018-04-09 ENCOUNTER — Encounter

## 2018-04-12 ENCOUNTER — Ambulatory Visit (HOSPITAL_COMMUNITY): Payer: Medicare Other | Attending: Cardiology

## 2018-04-12 ENCOUNTER — Ambulatory Visit
Admission: RE | Admit: 2018-04-12 | Discharge: 2018-04-12 | Disposition: A | Discharge status: Home / Self Care | Payer: Medicare Other | Source: Ambulatory Visit | Attending: Internal Medicine | Admitting: Internal Medicine

## 2018-04-12 ENCOUNTER — Other Ambulatory Visit: Payer: Self-pay

## 2018-04-12 DIAGNOSIS — R7303 Prediabetes: Secondary | ICD-10-CM | POA: Diagnosis not present

## 2018-04-12 DIAGNOSIS — I081 Rheumatic disorders of both mitral and tricuspid valves: Secondary | ICD-10-CM | POA: Diagnosis not present

## 2018-04-12 DIAGNOSIS — E785 Hyperlipidemia, unspecified: Secondary | ICD-10-CM | POA: Diagnosis not present

## 2018-04-12 DIAGNOSIS — I493 Ventricular premature depolarization: Secondary | ICD-10-CM

## 2018-04-12 DIAGNOSIS — R1084 Generalized abdominal pain: Secondary | ICD-10-CM

## 2018-04-12 MED ORDER — IOPAMIDOL (ISOVUE-300) INJECTION 61%
100.0000 mL | Freq: Once | INTRAVENOUS | Status: AC | PRN
  Administered 2018-04-12: 100 mL via INTRAVENOUS

## 2018-04-19 ENCOUNTER — Telehealth: Payer: Self-pay | Admitting: Cardiology

## 2018-04-19 ENCOUNTER — Telehealth: Payer: Self-pay | Admitting: *Deleted

## 2018-04-19 NOTE — Telephone Encounter (Signed)
Notes recorded by Troy Sine, MD on 04/15/2018 at 8:03 AM EDT Normal LV function; EF 55 to 60%. Normal diastolic parameters. Mild MR and TR  Patient called w/results

## 2018-04-19 NOTE — Telephone Encounter (Signed)
Patient is aware of results and she will try Mirlax for constipation.

## 2018-04-19 NOTE — Telephone Encounter (Signed)
Patient calling for results to her Echo.

## 2018-05-03 NOTE — Progress Notes (Signed)
FOLLOW UP  Assessment and Plan:   Hypertension Well controlled off of medications Monitor blood pressure at home; patient to call if consistently greater than 130/80 Continue DASH diet.   Reminder to go to the ER if any CP, SOB, nausea, dizziness, severe HA, changes vision/speech, left arm numbness and tingling and jaw pain.  Cholesterol Currently above goal; she is agreeable to restarting zetia, discussed LDL goal minimum <130 Continue low cholesterol diet and exercise.  Check lipid panel.   Prediabetes Continue diet and exercise.  Perform daily foot/skin check, notify office of any concerning changes.  Check A1C  BMi 24 Continue to recommend diet heavy in fruits and veggies and low in animal meats, cheeses, and dairy products, appropriate calorie intake Discuss exercise recommendations routinely, decrease splurge snacking (cheetos, cheeze-its) Continue to monitor weight at each visit  Vitamin D Def At goal at last visit; continue supplementation to maintain goal of 70-100 Defer Vit D level  GERD  Fairly managed by lifestyle modification Discussed diet, avoiding triggers and other lifestyle changes   Continue diet and meds as discussed. Further disposition pending results of labs. Discussed med's effects and SE's.   Over 30 minutes of exam, counseling, chart review, and critical decision making was performed.   Future Appointments  Date Time Provider Hanna  08/10/2018  4:30 PM Unk Pinto, MD [facility] None  02/17/2019  9:00 AM Unk Pinto, MD [facility] None  04/13/2019  9:30 AM Fontaine, Belinda Block, MD [facility]    ----------------------------------------------------------------------------------------------------------------------  HPI 67 y.o. female  presents for 3 month follow up on hypertension, cholesterol, prediabetes, weight and vitamin D deficiency.   Recently noted low pulse and bigeminy on EKG; was referred to Dr. Martinique for  evaluation due to reports of recent dyspnea, fatigue, chest pressure, but did not recommend further workup. She reports she has done well since last visit.   she has a diagnosis of GERD which is currently managed by lifestyle modification, drinking ACV.  she reports symptoms is currently well controlled, and denies breakthrough reflux, burning in chest, hoarseness or cough.    She does have constipation, but improved with align, taking citrucel, miralax, working on increasing water intake.   BMI is Body mass index is 24.72 kg/m., she has been working on diet and exercise, has silver sneakers and doing yoga, going 2-3 days per week.  Wt Readings from Last 3 Encounters:  05/04/18 144 lb (65.3 kg)  04/07/18 145 lb (65.8 kg)  04/01/18 142 lb 12.8 oz (64.8 kg)   Her blood pressure has been controlled at home, today their BP is BP: 98/62  She does workout. She denies chest pain, shortness of breath, dizziness.   She is on cholesterol medication Zetia and omega 3, has been off of zetia but willing to restart and denies myalgias. Her cholesterol is not at goal. The cholesterol last visit was:   Lab Results  Component Value Date   CHOL 237 (H) 01/28/2018   HDL 74 01/28/2018   LDLCALC 147 (H) 01/28/2018   TRIG 61 01/28/2018   CHOLHDL 3.2 01/28/2018    She has been working on diet and exercise for prediabetes, and denies increased appetite, nausea, paresthesia of the feet, polydipsia, polyuria and visual disturbances. Last A1C in the office was:  Lab Results  Component Value Date   HGBA1C 5.8 (H) 01/28/2018   Patient is on Vitamin D supplement.   Lab Results  Component Value Date   VD25OH 70 01/28/2018  Current Medications:  Current Outpatient Medications on File Prior to Visit  Medication Sig  . alum & mag hydroxide-simeth (MAALOX/MYLANTA) 200-200-20 MG/5ML suspension Take 30 mLs by mouth every 6 (six) hours as needed for indigestion or heartburn.  Marland Kitchen aspirin 81 MG tablet Take  81 mg by mouth daily.  . cetirizine (ZYRTEC) 10 MG tablet Take 10 mg by mouth daily as needed for allergies.  . Cholecalciferol (D-3-5) 5000 UNITS capsule Take 5,000 Units by mouth daily.  Marland Kitchen Dexlansoprazole (DEXILANT PO) Take by mouth daily.  . Flaxseed, Linseed, (FLAXSEED OIL PO) Take by mouth daily.  . fluticasone (FLONASE) 50 MCG/ACT nasal spray Place 2 sprays into both nostrils daily.  . Lutein 20 MG TABS Take by mouth.  . meloxicam (MOBIC) 15 MG tablet Take one daily with food for 2 weeks, can take with tylenol, can not take with aleve, iburpofen, then as needed daily for pain  . Multiple Vitamins-Minerals (WOMENS MULTI VITAMIN & MINERAL PO) Take 1 tablet by mouth daily.  . Omega-3 Fatty Acids (FISH OIL PO) Take by mouth daily.  Marland Kitchen OVER THE COUNTER MEDICATION Patient takes MOM as needed.  . Probiotic Product (ALIGN) 4 MG CAPS Take by mouth daily.  . RESTASIS 0.05 % ophthalmic emulsion instill 1 drop into both eyes twice a day  . tizanidine (ZANAFLEX) 2 MG capsule Take 1 capsule (2 mg total) by mouth 3 (three) times daily.  Marland Kitchen conjugated estrogens (PREMARIN) vaginal cream Place 1 Applicatorful vaginally daily. (Patient not taking: Reported on 05/04/2018)  . ezetimibe (ZETIA) 10 MG tablet Take 1 tablet (10 mg total) by mouth daily. (Patient not taking: Reported on 05/04/2018)   No current facility-administered medications on file prior to visit.      Allergies:  Allergies  Allergen Reactions  . Bactrim [Sulfamethoxazole-Trimethoprim] Other (See Comments)    Patient preference to take medication without sulfa     Medical History:  Past Medical History:  Diagnosis Date  . Arthritis    cervical and lumbar  . Depression   . GERD (gastroesophageal reflux disease)   . Hyperlipidemia   . IBS (irritable bowel syndrome)   . Prediabetes   . Vitamin D deficiency    Family history- Reviewed and unchanged Social history- Reviewed and unchanged   Review of Systems:  Review of Systems   Constitutional: Negative for malaise/fatigue and weight loss.  HENT: Negative for hearing loss and tinnitus.   Eyes: Negative for blurred vision and double vision.  Respiratory: Negative for cough, shortness of breath and wheezing.   Cardiovascular: Negative for chest pain, palpitations, orthopnea, claudication and leg swelling.  Gastrointestinal: Positive for constipation. Negative for abdominal pain, blood in stool, diarrhea, heartburn, melena, nausea and vomiting.  Genitourinary: Negative.   Musculoskeletal: Negative for joint pain and myalgias.  Skin: Negative for rash.  Neurological: Negative for dizziness, tingling, sensory change, weakness and headaches.  Endo/Heme/Allergies: Negative for polydipsia.  Psychiatric/Behavioral: Negative.   All other systems reviewed and are negative.   Physical Exam: BP 98/62   Pulse 71   Temp (!) 97.3 F (36.3 C)   Ht 5\' 4"  (1.626 m)   Wt 144 lb (65.3 kg)   SpO2 97%   BMI 24.72 kg/m  Wt Readings from Last 3 Encounters:  05/04/18 144 lb (65.3 kg)  04/07/18 145 lb (65.8 kg)  04/01/18 142 lb 12.8 oz (64.8 kg)   General Appearance: Well nourished, in no apparent distress. Eyes: PERRLA, EOMs, conjunctiva no swelling or erythema Sinuses: No Frontal/maxillary tenderness  ENT/Mouth: Ext aud canals clear, TMs without erythema, bulging. No erythema, swelling, or exudate on post pharynx.  Tonsils not swollen or erythematous. Hearing normal.  Neck: Supple, thyroid normal.  Respiratory: Respiratory effort normal, BS equal bilaterally without rales, rhonchi, wheezing or stridor.  Cardio: RRR with no MRGs. Brisk peripheral pulses without edema.  Abdomen: Soft, + BS.  Non tender, no guarding, rebound, hernias, masses. Lymphatics: Non tender without lymphadenopathy.  Musculoskeletal: Full ROM, 5/5 strength, Normal gait Skin: Warm, dry without rashes, lesions, ecchymosis.  Neuro: Cranial nerves intact. No cerebellar symptoms.  Psych: Awake and oriented  X 3, normal affect, Insight and Judgment appropriate.    Izora Ribas, NP 4:45 PM [facility]

## 2018-05-04 ENCOUNTER — Encounter: Payer: Self-pay | Admitting: Adult Health

## 2018-05-04 ENCOUNTER — Ambulatory Visit (INDEPENDENT_AMBULATORY_CARE_PROVIDER_SITE_OTHER): Payer: Medicare Other | Admitting: Adult Health

## 2018-05-04 VITALS — BP 98/62 | HR 71 | Temp 97.3°F | Ht 64.0 in | Wt 144.0 lb

## 2018-05-04 DIAGNOSIS — E785 Hyperlipidemia, unspecified: Secondary | ICD-10-CM

## 2018-05-04 DIAGNOSIS — Z6824 Body mass index (BMI) 24.0-24.9, adult: Secondary | ICD-10-CM

## 2018-05-04 DIAGNOSIS — K219 Gastro-esophageal reflux disease without esophagitis: Secondary | ICD-10-CM

## 2018-05-04 DIAGNOSIS — R7309 Other abnormal glucose: Secondary | ICD-10-CM

## 2018-05-04 DIAGNOSIS — E559 Vitamin D deficiency, unspecified: Secondary | ICD-10-CM

## 2018-05-04 DIAGNOSIS — R0989 Other specified symptoms and signs involving the circulatory and respiratory systems: Secondary | ICD-10-CM

## 2018-05-04 DIAGNOSIS — Z79899 Other long term (current) drug therapy: Secondary | ICD-10-CM

## 2018-05-04 NOTE — Patient Instructions (Signed)
Goals    . HEMOGLOBIN A1C < 5.6    . LDL CALC < 130         Drink 1/2 your body weight in fluid ounces of water daily; drink a tall glass of water 30 min before meals  Don't eat until you're stuffed- listen to your stomach and eat until you are 80% full   Try eating off of a salad plate; wait 10 min after finishing before going back for seconds  Start by eating the vegetables on your plate; aim for 50% of your meals to be fruits or vegetables  Then eat your protein - lean meats (grass fed if possible), fish, beans, nuts in moderation  Eat your carbs/starch last ONLY if you still are hungry. If you can, stop before finishing it all  Avoid sugar and flour - the closer it looks to it's original form in nature, typically the better it is for you  Splurge in moderation - "assign" days when you get to splurge and have the "bad stuff" - I like to follow a 80% - 20% plan- "good" choices 80 % of the time, "bad" choices in moderation 20% of the time  Simple equation is: Calories out > calories in = weight loss - even if you eat the bad stuff, if you limit portions, you will still lose weight   Know what a healthy weight is for you (roughly BMI <25) and aim to maintain this  Aim for 7+ servings of fruits and vegetables daily  65-80+ fluid ounces of water or unsweet tea for healthy kidneys  Limit to max 1 drink of alcohol per day; avoid smoking/tobacco  Limit animal fats in diet for cholesterol and heart health - choose grass fed whenever available  Avoid highly processed foods, and foods high in saturated/trans fats  Aim for low stress - take time to unwind and care for your mental health  Aim for 150 min of moderate intensity exercise weekly for heart health, and weights twice weekly for bone health  Aim for 7-9 hours of sleep daily

## 2018-05-05 LAB — CBC WITH DIFFERENTIAL/PLATELET
BASOS PCT: 1 %
Basophils Absolute: 31 cells/uL (ref 0–200)
Eosinophils Absolute: 90 cells/uL (ref 15–500)
Eosinophils Relative: 2.9 %
HCT: 39.6 % (ref 35.0–45.0)
Hemoglobin: 13.3 g/dL (ref 11.7–15.5)
Lymphs Abs: 1705 cells/uL (ref 850–3900)
MCH: 28.9 pg (ref 27.0–33.0)
MCHC: 33.6 g/dL (ref 32.0–36.0)
MCV: 85.9 fL (ref 80.0–100.0)
MONOS PCT: 8.6 %
MPV: 11.6 fL (ref 7.5–12.5)
NEUTROS PCT: 32.5 %
Neutro Abs: 1008 cells/uL — ABNORMAL LOW (ref 1500–7800)
PLATELETS: 178 10*3/uL (ref 140–400)
RBC: 4.61 10*6/uL (ref 3.80–5.10)
RDW: 12.2 % (ref 11.0–15.0)
TOTAL LYMPHOCYTE: 55 %
WBC mixed population: 267 cells/uL (ref 200–950)
WBC: 3.1 10*3/uL — AB (ref 3.8–10.8)

## 2018-05-05 LAB — COMPLETE METABOLIC PANEL WITH GFR
AG RATIO: 1.6 (calc) (ref 1.0–2.5)
ALT: 15 U/L (ref 6–29)
AST: 19 U/L (ref 10–35)
Albumin: 4.2 g/dL (ref 3.6–5.1)
Alkaline phosphatase (APISO): 83 U/L (ref 33–130)
BUN: 12 mg/dL (ref 7–25)
CHLORIDE: 105 mmol/L (ref 98–110)
CO2: 31 mmol/L (ref 20–32)
Calcium: 9.5 mg/dL (ref 8.6–10.4)
Creat: 0.84 mg/dL (ref 0.50–0.99)
GFR, EST AFRICAN AMERICAN: 84 mL/min/{1.73_m2} (ref >=60)
GFR, Est Non African American: 72 mL/min/{1.73_m2} (ref >=60)
GLUCOSE: 119 mg/dL — AB (ref 65–99)
Globulin: 2.7 g/dL (calc) (ref 1.9–3.7)
POTASSIUM: 4.5 mmol/L (ref 3.5–5.3)
Sodium: 140 mmol/L (ref 135–146)
TOTAL PROTEIN: 6.9 g/dL (ref 6.1–8.1)
Total Bilirubin: 0.4 mg/dL (ref 0.2–1.2)

## 2018-05-05 LAB — LIPID PANEL
CHOLESTEROL: 230 mg/dL — AB (ref <200)
HDL: 73 mg/dL (ref >50)
LDL Cholesterol (Calc): 142 mg/dL (calc) — ABNORMAL HIGH
NON-HDL CHOLESTEROL (CALC): 157 mg/dL — AB (ref <130)
TRIGLYCERIDES: 61 mg/dL (ref <150)
Total CHOL/HDL Ratio: 3.2 (calc) (ref <5.0)

## 2018-05-05 LAB — MAGNESIUM: Magnesium: 2.2 mg/dL (ref 1.5–2.5)

## 2018-05-05 LAB — TSH: TSH: 0.84 mIU/L (ref 0.40–4.50)

## 2018-05-05 LAB — HEMOGLOBIN A1C
EAG (MMOL/L): 6.8 (calc)
Hgb A1c MFr Bld: 5.9 % of total Hgb — ABNORMAL HIGH (ref <5.7)
MEAN PLASMA GLUCOSE: 123 (calc)

## 2018-05-26 NOTE — Progress Notes (Signed)
Assessment and Plan:  Alejah was seen today for pelvic pain and dysuria.  Diagnoses and all orders for this visit:  Dysuria UTI versus OAB versus vaginal dryness - will check UA, C&S. Defer abx per patient preference, discussed will send in macrobid in AM if suggestive of UTI - continue to push fluids, report any new symptoms -     Urinalysis w microscopic + reflex cultur  Further disposition pending results of labs. Discussed med's effects and SE's.   Over 15 minutes of exam, counseling, chart review, and critical decision making was performed.   Future Appointments  Date Time Provider [place]  08/10/2018  4:30 PM Unk Pinto, MD [facility] None  02/17/2019  9:00 AM Unk Pinto, MD [facility] None  04/13/2019  9:30 AM Fontaine, Belinda Block, MD [facility]    ------------------------------------------------------------------------------------------------------------------   HPI BP (!) 96/56   Pulse 73   Temp 97.7 F (36.5 C)   Ht 5\' 4"  (1.626 m)   Wt 141 lb (64 kg)   SpO2 98%   BMI 24.20 kg/m   67 y.o.female presents with dysuria, perineal discomfort, suprapubic discomfort, intermittent over the last 2-3 weeks. She endorses dark color to urine, without cloudiness. Has been pushing water. She endorses mild bilateral lower back achiness. Denies fever/chills. Denies incontinence. She gets up once in the evening to urinate consistent with baseline. Denies perineal rash, vaginal discharge/itching, nausea/vomiting.   She is married with stable partner. Denies discomfort with intercourse during this time. She has had GYN eval this year with normal pap, no STDs.   Past Medical History:  Diagnosis Date  . Arthritis    cervical and lumbar  . Depression   . GERD (gastroesophageal reflux disease)   . Hyperlipidemia   . IBS (irritable bowel syndrome)   . Prediabetes   . Vitamin D deficiency      Allergies  Allergen Reactions  . Bactrim  [Sulfamethoxazole-Trimethoprim] Other (See Comments)    Patient preference to take medication without sulfa    Current Outpatient Medications on File Prior to Visit  Medication Sig  . alum & mag hydroxide-simeth (MAALOX/MYLANTA) 200-200-20 MG/5ML suspension Take 30 mLs by mouth every 6 (six) hours as needed for indigestion or heartburn.  Marland Kitchen aspirin 81 MG tablet Take 81 mg by mouth daily.  . cetirizine (ZYRTEC) 10 MG tablet Take 10 mg by mouth daily as needed for allergies.  . Cholecalciferol (D-3-5) 5000 UNITS capsule Take 5,000 Units by mouth daily.  Marland Kitchen conjugated estrogens (PREMARIN) vaginal cream Place 1 Applicatorful vaginally daily.  Marland Kitchen Dexlansoprazole (DEXILANT PO) Take by mouth daily.  Marland Kitchen ezetimibe (ZETIA) 10 MG tablet Take 1 tablet (10 mg total) by mouth daily.  . Flaxseed, Linseed, (FLAXSEED OIL PO) Take by mouth daily.  . fluticasone (FLONASE) 50 MCG/ACT nasal spray Place 2 sprays into both nostrils daily.  . Lutein 20 MG TABS Take by mouth.  . Multiple Vitamins-Minerals (WOMENS MULTI VITAMIN & MINERAL PO) Take 1 tablet by mouth daily.  . Omega-3 Fatty Acids (FISH OIL PO) Take by mouth daily.  Marland Kitchen omeprazole (PRILOSEC) 40 MG capsule Take 40 mg by mouth daily.  Marland Kitchen OVER THE COUNTER MEDICATION Patient takes MOM as needed.  . Probiotic Product (ALIGN) 4 MG CAPS Take by mouth daily.  . RESTASIS 0.05 % ophthalmic emulsion instill 1 drop into both eyes twice a day   No current facility-administered medications on file prior to visit.     ROS: all negative except above.   Physical Exam:  BP (!) 96/56   Pulse 73   Temp 97.7 F (36.5 C)   Ht 5\' 4"  (1.626 m)   Wt 141 lb (64 kg)   SpO2 98%   BMI 24.20 kg/m   General Appearance: Well nourished, in no apparent distress. Eyes: conjunctiva no swelling or erythema ENT/Mouth: Hearing normal.  Neck: Supple  Respiratory: Respiratory effort normal, BS equal bilaterally without rales, rhonchi, wheezing or stridor.  Cardio: RRR with no MRGs.  Brisk peripheral pulses without edema.  Abdomen: Soft, + BS.  Non tender, no guarding, rebound, hernias, masses. No CVA tenderness.  Lymphatics: Non tender without lymphadenopathy.  Musculoskeletal: normal gait. Mild lumbar paraspinal tenderness.  Skin: Warm, dry without rashes, lesions, ecchymosis.  Neuro: Normal muscle tone Psych: Awake and oriented X 3, normal affect, Insight and Judgment appropriate.     Izora Ribas, NP 3:41 PM [facility]

## 2018-05-27 ENCOUNTER — Encounter: Payer: Self-pay | Admitting: Adult Health

## 2018-05-27 ENCOUNTER — Ambulatory Visit: Payer: Medicare Other | Admitting: Adult Health

## 2018-05-27 VITALS — BP 96/56 | HR 73 | Temp 97.7°F | Ht 64.0 in | Wt 141.0 lb

## 2018-05-27 DIAGNOSIS — R300 Dysuria: Secondary | ICD-10-CM

## 2018-05-27 NOTE — Patient Instructions (Signed)
Dysuria Dysuria is pain or discomfort while urinating. The pain or discomfort may be felt in the tube that carries urine out of the bladder (urethra) or in the surrounding tissue of the genitals. The pain may also be felt in the groin area, lower abdomen, and lower back. You may have to urinate frequently or have the sudden feeling that you have to urinate (urgency). Dysuria can affect both men and women, but is more common in women. Dysuria can be caused by many different things, including:  Urinary tract infection in women.  Infection of the kidney or bladder.  Kidney stones or bladder stones.  Certain sexually transmitted infections (STIs), such as chlamydia.  Dehydration.  Inflammation of the vagina.  Use of certain medicines.  Use of certain soaps or scented products that cause irritation.  Follow these instructions at home: Watch your dysuria for any changes. The following actions may help to reduce any discomfort you are feeling:  Drink enough fluid to keep your urine clear or pale yellow.  Empty your bladder often. Avoid holding urine for long periods of time.  After a bowel movement or urination, women should cleanse from front to back, using each tissue only once.  Empty your bladder after sexual intercourse.  Take medicines only as directed by your health care provider.  If you were prescribed an antibiotic medicine, finish it all even if you start to feel better.  Avoid caffeine, tea, and alcohol. They can irritate the bladder and make dysuria worse. In men, alcohol may irritate the prostate.  Keep all follow-up visits as directed by your health care provider. This is important.  If you had any tests done to find the cause of dysuria, it is your responsibility to obtain your test results. Ask the lab or department performing the test when and how you will get your results. Talk with your health care provider if you have any questions about your results.  Contact a  health care provider if:  You develop pain in your back or sides.  You have a fever.  You have nausea or vomiting.  You have blood in your urine.  You are not urinating as often as you usually do. Get help right away if:  You pain is severe and not relieved with medicines.  You are unable to hold down any fluids.  You or someone else notices a change in your mental function.  You have a rapid heartbeat at rest.  You have shaking or chills.  You feel extremely weak. This information is not intended to replace advice given to you by your health care provider. Make sure you discuss any questions you have with your health care provider. Document Released: 02/29/2004 Document Revised: 11/08/2015 Document Reviewed: 01/26/2014 Elsevier Interactive Patient Education  2018 [employer].   Nitrofurantoin tablets or capsules What is this medicine? NITROFURANTOIN (nye troe fyoor AN toyn) is an antibiotic. It is used to treat urinary tract infections. This medicine may be used for other purposes; ask your health care provider or pharmacist if you have questions. COMMON BRAND NAME(S): Macrobid, Macrodantin, Urotoin What should I tell my health care provider before I take this medicine? They need to know if you have any of these conditions: -anemia -diabetes -glucose-6-phosphate dehydrogenase deficiency -kidney disease -liver disease -lung disease -other chronic illness -an unusual or allergic reaction to nitrofurantoin, other antibiotics, other medicines, foods, dyes or preservatives -pregnant or trying to get pregnant -breast-feeding How should I use this medicine? Take this medicine  by mouth with a glass of water. Follow the directions on the prescription label. Take this medicine with food or milk. Take your doses at regular intervals. Do not take your medicine more often than directed. Do not stop taking except on your doctor's advice. Talk to your pediatrician regarding the  use of this medicine in children. While this drug may be prescribed for selected conditions, precautions do apply. Overdosage: If you think you have taken too much of this medicine contact a poison control center or emergency room at once. NOTE: This medicine is only for you. Do not share this medicine with others. What if I miss a dose? If you miss a dose, take it as soon as you can. If it is almost time for your next dose, take only that dose. Do not take double or extra doses. What may interact with this medicine? -antacids containing magnesium trisilicate -probenecid -quinolone antibiotics like ciprofloxacin, lomefloxacin, norfloxacin and ofloxacin -sulfinpyrazone This list may not describe all possible interactions. Give your health care provider a list of all the medicines, herbs, non-prescription drugs, or dietary supplements you use. Also tell them if you smoke, drink alcohol, or use illegal drugs. Some items may interact with your medicine. What should I watch for while using this medicine? Tell your doctor or health care professional if your symptoms do not improve or if you get new symptoms. Drink several glasses of water a day. If you are taking this medicine for a long time, visit your doctor for regular checks on your progress. If you are diabetic, you may get a false positive result for sugar in your urine with certain brands of urine tests. Check with your doctor. What side effects may I notice from receiving this medicine? Side effects that you should report to your doctor or health care professional as soon as possible: -allergic reactions like skin rash or hives, swelling of the face, lips, or tongue -chest pain -cough -difficulty breathing -dizziness, drowsiness -fever or infection -joint aches or pains -pale or blue-tinted skin -redness, blistering, peeling or loosening of the skin, including inside the mouth -tingling, burning, pain, or numbness in hands or  feet -unusual bleeding or bruising -unusually weak or tired -yellowing of eyes or skin Side effects that usually do not require medical attention (report to your doctor or health care professional if they continue or are bothersome): -dark urine -diarrhea -headache -loss of appetite -nausea or vomiting -temporary hair loss This list may not describe all possible side effects. Call your doctor for medical advice about side effects. You may report side effects to FDA at 1-800-FDA-1088. Where should I keep my medicine? Keep out of the reach of children. Store at room temperature between 15 and 30 degrees C (59 and 86 degrees F). Protect from light. Throw away any unused medicine after the expiration date. NOTE: This sheet is a summary. It may not cover all possible information. If you have questions about this medicine, talk to your doctor, pharmacist, or health care provider.  2018 Elsevier/Gold Standard (2007-12-22 15:56:47)

## 2018-05-28 ENCOUNTER — Other Ambulatory Visit: Payer: Self-pay | Admitting: Adult Health

## 2018-05-28 DIAGNOSIS — R3915 Urgency of urination: Secondary | ICD-10-CM

## 2018-05-28 DIAGNOSIS — R300 Dysuria: Secondary | ICD-10-CM

## 2018-05-28 LAB — URINALYSIS W MICROSCOPIC + REFLEX CULTURE
BACTERIA UA: NONE SEEN /HPF
Bilirubin Urine: NEGATIVE
GLUCOSE, UA: NEGATIVE
HGB URINE DIPSTICK: NEGATIVE
HYALINE CAST: NONE SEEN /LPF
Ketones, ur: NEGATIVE
Leukocyte Esterase: NEGATIVE
Nitrites, Initial: NEGATIVE
PROTEIN: NEGATIVE
RBC / HPF: NONE SEEN /HPF (ref 0–2)
SQUAMOUS EPITHELIAL / LPF: NONE SEEN /HPF (ref <=5)
Specific Gravity, Urine: 1.012 (ref 1.001–1.03)
WBC UA: NONE SEEN /HPF (ref 0–5)
pH: 6 (ref 5.0–8.0)

## 2018-05-28 LAB — NO CULTURE INDICATED

## 2018-05-28 MED ORDER — PREDNISONE 20 MG PO TABS: ORAL_TABLET | ORAL | 0 refills | Status: DC

## 2018-05-28 NOTE — Progress Notes (Signed)
Patient with recurrent dysuria/frequency/urgency symptoms over the past year without UTI per UA/culture, premarin has been prescribed without improvement, seeing GYN, most recently Dr. Phineas Real on 04/07/2018 without notable abnormality. Discussed with patient and will proceed with urology referral for further evaluation and workup.

## 2018-08-09 ENCOUNTER — Encounter: Payer: Self-pay | Admitting: Internal Medicine

## 2018-08-09 NOTE — Patient Instructions (Signed)

## 2018-08-09 NOTE — Progress Notes (Signed)
This very nice 68 y.o. presents for 6 month follow up with HTN, HLD, Pre-Diabetes and Vitamin D Deficiency. Patient also has hx/o GERD  & IBS - both quiescent at present.  She also c/o pains in the Metatarsal areas of her feet.     Patient is followed expectantly with a hx/o labile HTN & BP has been controlled at home. Today's BP is at goal - 100/62. Patient has had no complaints of any cardiac type chest pain, palpitations, dyspnea / orthopnea / PND, dizziness, claudication, or dependent edema.     Hyperlipidemia is not controlled with diet and as she is recalcitrant to take statin meds for Cholesterol, she did agree to take Zetia at last August.  Last Lipids were still not at goal: Lab Results  Component Value Date   CHOL 230 (H) 05/04/2018   HDL 73 05/04/2018   LDLCALC 142 (H) 05/04/2018   TRIG 61 05/04/2018   CHOLHDL 3.2 05/04/2018      Also, the patient has history of PreDiabetes (A1c 5.7% / 2014, 5.9% / 2015 & 6.1% / 2016)  and has had no symptoms of reactive hypoglycemia, diabetic polys, paresthesias or visual blurring.  Last A1c was still not at goal: Lab Results  Component Value Date   HGBA1C 5.9 (H) 05/04/2018      Further, the patient also has history of Vitamin D Deficiency and supplements vitamin D without any suspected side-effects. Last vitamin D was at goal: Lab Results  Component Value Date   VD25OH 68 01/28/2018   Current Outpatient Medications on File Prior to Visit  Medication Sig  . alum & mag hydroxide-simeth (MAALOX/MYLANTA) 200-200-20 MG/5ML suspension Take 30 mLs by mouth every 6 (six) hours as needed for indigestion or heartburn.  Marland Kitchen aspirin 81 MG tablet Take 81 mg by mouth daily.  . cetirizine (ZYRTEC) 10 MG tablet Take 10 mg by mouth daily as needed for allergies.  . Cholecalciferol (D-3-5) 5000 UNITS capsule Take 5,000 Units by mouth daily.  Marland Kitchen conjugated estrogens (PREMARIN) vaginal cream Place 1 Applicatorful vaginally daily.  Marland Kitchen Dexlansoprazole  (DEXILANT PO) Take by mouth daily.  Marland Kitchen ezetimibe (ZETIA) 10 MG tablet Take 1 tablet (10 mg total) by mouth daily.  . Flaxseed, Linseed, (FLAXSEED OIL PO) Take by mouth daily.  . fluticasone (FLONASE) 50 MCG/ACT nasal spray Place 2 sprays into both nostrils daily.  . Lutein 20 MG TABS Take by mouth.  . Multiple Vitamins-Minerals (WOMENS MULTI VITAMIN & MINERAL PO) Take 1 tablet by mouth daily.  . Omega-3 Fatty Acids (FISH OIL PO) Take by mouth daily.  Marland Kitchen omeprazole (PRILOSEC) 40 MG capsule Take 40 mg by mouth daily.  Marland Kitchen OVER THE COUNTER MEDICATION Patient takes MOM as needed.  . Probiotic Product (ALIGN) 4 MG CAPS Take by mouth daily.  . RESTASIS 0.05 % ophthalmic emulsion instill 1 drop into both eyes twice a day   No current facility-administered medications on file prior to visit.    Allergies  Allergen Reactions  . Bactrim [Sulfamethoxazole-Trimethoprim] Other (See Comments)    Patient preference to take medication without sulfa   PMHx:   Past Medical History:  Diagnosis Date  . Arthritis    cervical and lumbar  . Depression   . GERD (gastroesophageal reflux disease)   . Hyperlipidemia   . IBS (irritable bowel syndrome)   . Prediabetes   . Vitamin D deficiency    Immunization History  Administered Date(s) Administered  . Influenza, High Dose Seasonal  PF 06/25/2017  . Influenza-Unspecified 03/24/2018  . PPD Test 12/12/2014, 12/26/2015, 01/15/2017  . Td 05/18/2007  . Zoster 05/17/2006   Past Surgical History:  Procedure Laterality Date  . CARDIAC CATHETERIZATION  2011   normal (Dr. Terrence Dupont)  . RADIOACTIVE SEED GUIDED EXCISIONAL BREAST BIOPSY Right 03/11/2016   Procedure: RIGHT RADIOACTIVE SEED GUIDED EXCISIONAL BREAST BIOPSY;  Surgeon: Rolm Bookbinder, MD;  Location: [facility];  Service: General;  Laterality: Right;   FHx:    Reviewed / unchanged  SHx:    Reviewed / unchanged   Systems Review:  Constitutional: Denies fever, chills, wt changes,  headaches, insomnia, fatigue, night sweats, change in appetite. Eyes: Denies redness, blurred vision, diplopia, discharge, itchy, watery eyes.  ENT: Denies discharge, congestion, post nasal drip, epistaxis, sore throat, earache, hearing loss, dental pain, tinnitus, vertigo, sinus pain, snoring.  CV: Denies chest pain, palpitations, irregular heartbeat, syncope, dyspnea, diaphoresis, orthopnea, PND, claudication or edema. Respiratory: denies cough, dyspnea, DOE, pleurisy, hoarseness, laryngitis, wheezing.  Gastrointestinal: Denies dysphagia, odynophagia, heartburn, reflux, water brash, abdominal pain or cramps, nausea, vomiting, bloating, diarrhea, constipation, hematemesis, melena, hematochezia  or hemorrhoids. Genitourinary: Denies dysuria, frequency, urgency, nocturia, hesitancy, discharge, hematuria or flank pain. Musculoskeletal: Denies arthralgias, myalgias, stiffness, jt. swelling, pain, limping or strain/sprain.  Skin: Denies pruritus, rash, hives, warts, acne, eczema or change in skin lesion(s). Neuro: No weakness, tremor, incoordination, spasms, paresthesia or pain. Psychiatric: Denies confusion, memory loss or sensory loss. Endo: Denies change in weight, skin or hair change.  Heme/Lymph: No excessive bleeding, bruising or enlarged lymph nodes.  Physical Exam  BP 100/62   Pulse (!) 56   Temp (!) 97.3 F (36.3 C)   Resp 16   Ht 5\' 5"  (1.651 m)   Wt 144 lb 9.6 oz (65.6 kg)   BMI 24.06 kg/m   Appears  well nourished, well groomed  and in no distress.  Eyes: PERRLA, EOMs, conjunctiva no swelling or erythema. Sinuses: No frontal/maxillary tenderness ENT/Mouth: EAC's clear, TM's nl w/o erythema, bulging. Nares clear w/o erythema, swelling, exudates. Oropharynx clear without erythema or exudates. Oral hygiene is good. Tongue normal, non obstructing. Hearing intact.  Neck: Supple. Thyroid not palpable. Car 2+/2+ without bruits, nodes or JVD. Chest: Respirations nl with BS clear &  equal w/o rales, rhonchi, wheezing or stridor.  Cor: Heart sounds normal w/ regular rate and rhythm without sig. murmurs, gallops, clicks or rubs. Peripheral pulses normal and equal  without edema.  Abdomen: Soft & bowel sounds normal. Non-tender w/o guarding, rebound, hernias, masses or organomegaly.  Lymphatics: Unremarkable.  Musculoskeletal: Full ROM all peripheral extremities, joint stability, 5/5 strength and normal gait. Patient has medium to high arches of her feet. Skin: Warm, dry without exposed rashes, lesions or ecchymosis apparent.  Neuro: Cranial nerves intact, reflexes equal bilaterally. Sensory-motor testing grossly intact. Tendon reflexes grossly intact.  Pysch: Alert & oriented x 3.  Insight and judgement nl & appropriate. No ideations.  Assessment and Plan:  1. Labile hypertension  - Continue medication, monitor blood pressure at home.  - Continue DASH diet.  Reminder to go to the ER if any CP,  SOB, nausea, dizziness, severe HA, changes vision/speech.  - CBC with Differential/Platelet - COMPLETE METABOLIC PANEL WITH GFR - Magnesium - TSH  2. Hyperlipidemia, mixed  - Continue diet/meds, exercise,& lifestyle modifications.  - Continue monitor periodic cholesterol/liver & renal functions   - Lipid panel - TSH  3. Abnormal glucose  - Continue diet, exercise  - Lifestyle modifications.  -  Monitor appropriate labs.  - Hemoglobin A1c - Insulin, random  4. Vitamin D deficiency  - Continue supplementation.  - VITAMIN D 25 Hydroxyl  5. Prediabetes  - Continue diet, exercise  - Lifestyle modifications.  - Monitor appropriate labs.  - Hemoglobin A1c - Insulin, random  6. Medication management  - CBC with Differential/Platelet - COMPLETE METABOLIC PANEL WITH GFR - Magnesium - Lipid panel - TSH - Hemoglobin A1c - Insulin, random - VITAMIN D 25 Hydroxyl        Discussed  regular exercise, BP monitoring, weight control to achieve/maintain BMI less  than 25 and discussed med and SE's. She's encouraged to purchase arch supports for her shoes. Recommended labs to assess and monitor clinical status with further disposition pending results of labs. Over 30 minutes of exam, counseling, chart review was performed.

## 2018-08-10 ENCOUNTER — Encounter: Payer: Self-pay | Admitting: Internal Medicine

## 2018-08-10 ENCOUNTER — Ambulatory Visit: Payer: Medicare Other | Admitting: Internal Medicine

## 2018-08-10 VITALS — BP 100/62 | HR 56 | Temp 97.3°F | Resp 16 | Ht 65.0 in | Wt 144.6 lb

## 2018-08-10 DIAGNOSIS — E782 Mixed hyperlipidemia: Secondary | ICD-10-CM

## 2018-08-10 DIAGNOSIS — R7309 Other abnormal glucose: Secondary | ICD-10-CM | POA: Diagnosis not present

## 2018-08-10 DIAGNOSIS — Z79899 Other long term (current) drug therapy: Secondary | ICD-10-CM

## 2018-08-10 DIAGNOSIS — R0989 Other specified symptoms and signs involving the circulatory and respiratory systems: Secondary | ICD-10-CM | POA: Diagnosis not present

## 2018-08-10 DIAGNOSIS — M7741 Metatarsalgia, right foot: Secondary | ICD-10-CM

## 2018-08-10 DIAGNOSIS — R7303 Prediabetes: Secondary | ICD-10-CM

## 2018-08-10 DIAGNOSIS — G5713 Meralgia paresthetica, bilateral lower limbs: Secondary | ICD-10-CM

## 2018-08-10 DIAGNOSIS — E559 Vitamin D deficiency, unspecified: Secondary | ICD-10-CM | POA: Diagnosis not present

## 2018-08-10 DIAGNOSIS — M7742 Metatarsalgia, left foot: Secondary | ICD-10-CM

## 2018-08-10 MED ORDER — GABAPENTIN 100 MG PO CAPS: ORAL_CAPSULE | ORAL | 2 refills | Status: DC

## 2018-08-11 LAB — HEMOGLOBIN A1C
EAG (MMOL/L): 6.6 (calc)
Hgb A1c MFr Bld: 5.8 % of total Hgb — ABNORMAL HIGH (ref <5.7)
Mean Plasma Glucose: 120 (calc)

## 2018-08-11 LAB — COMPLETE METABOLIC PANEL WITH GFR
AG Ratio: 1.4 (calc) (ref 1.0–2.5)
ALKALINE PHOSPHATASE (APISO): 93 U/L (ref 37–153)
ALT: 13 U/L (ref 6–29)
AST: 18 U/L (ref 10–35)
Albumin: 4.1 g/dL (ref 3.6–5.1)
BILIRUBIN TOTAL: 0.3 mg/dL (ref 0.2–1.2)
BUN: 12 mg/dL (ref 7–25)
CHLORIDE: 99 mmol/L (ref 98–110)
CO2: 28 mmol/L (ref 20–32)
Calcium: 9.6 mg/dL (ref 8.6–10.4)
Creat: 0.77 mg/dL (ref 0.50–0.99)
GFR, Est African American: 93 mL/min/{1.73_m2} (ref >=60)
GFR, Est Non African American: 80 mL/min/{1.73_m2} (ref >=60)
GLUCOSE: 92 mg/dL (ref 65–99)
Globulin: 2.9 g/dL (calc) (ref 1.9–3.7)
Potassium: 4 mmol/L (ref 3.5–5.3)
Sodium: 134 mmol/L — ABNORMAL LOW (ref 135–146)
TOTAL PROTEIN: 7 g/dL (ref 6.1–8.1)

## 2018-08-11 LAB — CBC WITH DIFFERENTIAL/PLATELET
Absolute Monocytes: 303 cells/uL (ref 200–950)
BASOS ABS: 30 {cells}/uL (ref 0–200)
Basophils Relative: 0.8 %
EOS ABS: 111 {cells}/uL (ref 15–500)
Eosinophils Relative: 3 %
HEMATOCRIT: 36.5 % (ref 35.0–45.0)
HEMOGLOBIN: 12.4 g/dL (ref 11.7–15.5)
LYMPHS ABS: 1658 {cells}/uL (ref 850–3900)
MCH: 29 pg (ref 27.0–33.0)
MCHC: 34 g/dL (ref 32.0–36.0)
MCV: 85.5 fL (ref 80.0–100.0)
MPV: 11.5 fL (ref 7.5–12.5)
Monocytes Relative: 8.2 %
NEUTROS ABS: 1598 {cells}/uL (ref 1500–7800)
Neutrophils Relative %: 43.2 %
Platelets: 220 10*3/uL (ref 140–400)
RBC: 4.27 10*6/uL (ref 3.80–5.10)
RDW: 12.2 % (ref 11.0–15.0)
Total Lymphocyte: 44.8 %
WBC: 3.7 10*3/uL — ABNORMAL LOW (ref 3.8–10.8)

## 2018-08-11 LAB — LIPID PANEL
Cholesterol: 201 mg/dL — ABNORMAL HIGH (ref <200)
HDL: 70 mg/dL (ref >=50)
LDL Cholesterol (Calc): 112 mg/dL (calc) — ABNORMAL HIGH
Non-HDL Cholesterol (Calc): 131 mg/dL (calc) — ABNORMAL HIGH (ref <130)
TRIGLYCERIDES: 86 mg/dL (ref <150)
Total CHOL/HDL Ratio: 2.9 (calc) (ref <5.0)

## 2018-08-11 LAB — TSH: TSH: 0.92 mIU/L (ref 0.40–4.50)

## 2018-08-11 LAB — VITAMIN D 25 HYDROXY (VIT D DEFICIENCY, FRACTURES): Vit D, 25-Hydroxy: 58 ng/mL (ref 30–100)

## 2018-08-11 LAB — INSULIN, RANDOM: INSULIN: 5.1 u[IU]/mL

## 2018-08-11 LAB — MAGNESIUM: MAGNESIUM: 2.1 mg/dL (ref 1.5–2.5)

## 2018-08-12 ENCOUNTER — Other Ambulatory Visit: Payer: Self-pay | Admitting: Internal Medicine

## 2018-08-12 DIAGNOSIS — E782 Mixed hyperlipidemia: Secondary | ICD-10-CM

## 2018-10-18 HISTORY — PX: COLONOSCOPY: SHX174

## 2018-10-18 LAB — HM COLONOSCOPY

## 2018-10-21 ENCOUNTER — Encounter: Payer: Self-pay | Admitting: *Deleted

## 2018-11-04 ENCOUNTER — Encounter: Payer: Self-pay | Admitting: Internal Medicine

## 2018-11-09 NOTE — Progress Notes (Signed)
[id number]  MEDICARE ANNUAL WELLNESS VISIT AND FOLLOW UP  Assessment:    Hyperlipidemia, unspecified hyperlipidemia type -continue medications, check lipids, decrease fatty foods, increase activity.  -     Lipid panel  Labile hypertension - continue medications, DASH diet, exercise and monitor at home. Call if greater than 130/80.  -     CBC with Differential/Platelet -     BASIC METABOLIC PANEL WITH GFR -     Hepatic function panel -     TSH  Abnormal glucose -     Hemoglobin A1c  Vitamin D deficiency Continue supplement  Medication management -     Magnesium  BMI  23.04,  adult Monitor  Gastroesophageal reflux disease, esophagitis presence not specified Continue PPI/H2 blocker, diet discussed   Medicare preventive visit 1 year Pneumonia today, tetanus next OV  Morton's neuroma Wear shoes that are wider, get insert, ice with water bottle If not better can refer to poditrist   Over 40 minutes of exam, counseling, chart review and critical decision making was performed Future Appointments  Date Time Provider [place]  02/17/2019  9:00 AM Unk Pinto, MD [facility] None  04/13/2019  9:30 AM Fontaine, Belinda Block, MD [facility]     Plan:   During the course of the visit the patient was educated and counseled about appropriate screening and preventive services including:    Pneumococcal vaccine   Prevnar 13  Influenza vaccine  Td vaccine  Screening electrocardiogram  Bone densitometry screening  Colorectal cancer screening  Diabetes screening  Glaucoma screening  Nutrition counseling   Advanced directives: requested   Subjective:  [name] is a 68 y.o. female who presents for Medicare Annual Wellness Visit and 3 month follow up.   She is on probiotic and off miralax and it is helping her bowel movements. She states she has some nausea at night, states feels like she has to sit up in the middle of the night to help it. Occ RUQ  pain. She states she has not issues with food or liquids getting caught.  AB US Gallbladder: Two gallbladder polyps versus noncalcified gallstones measuring up to 5 mm. No gallbladder wall thickening or pericholecystic fluid  She has pain on the right ball of her foot, started with wearing boots in the Jan/feb, has some odd sensation in 3rd and 4th toes. No warmth, redness, heat.   BMI is Body mass index is 23.56 kg/m., she is working on diet and exercise. Wt Readings from Last 3 Encounters:  11/10/18 141 lb 9.6 oz (64.2 kg)  08/10/18 144 lb 9.6 oz (65.6 kg)  05/27/18 141 lb (64 kg)    Her blood pressure has been controlled at home, today their BP is BP: 122/80 She does not workout.She denies chest pain, shortness of breath, dizziness.  She is not on cholesterol medication and denies myalgias. Her cholesterol is at goal. The cholesterol last visit was:   Lab Results  Component Value Date   CHOL 201 (H) 08/10/2018   HDL 70 08/10/2018   LDLCALC 112 (H) 08/10/2018   TRIG 86 08/10/2018   CHOLHDL 2.9 08/10/2018    Last A1C in the office was:  Lab Results  Component Value Date   HGBA1C 5.8 (H) 08/10/2018   Last GFR: Lab Results  Component Value Date   GFRAA 93 08/10/2018   Patient is on Vitamin D supplement, on 5000 IU.   Lab Results  Component Value Date   VD25OH 58 08/10/2018  Medication Review: Current Outpatient Medications on File Prior to Visit  Medication Sig Dispense Refill  . aspirin 81 MG tablet Take 81 mg by mouth daily.    . cetirizine (ZYRTEC) 10 MG tablet Take 10 mg by mouth daily as needed for allergies.    . Cholecalciferol (D-3-5) 5000 UNITS capsule Take 5,000 Units by mouth daily.    Marland Kitchen conjugated estrogens (PREMARIN) vaginal cream Place 1 Applicatorful vaginally daily. 42.5 g 12  . Dexlansoprazole (DEXILANT PO) Take by mouth daily.    Marland Kitchen ezetimibe (ZETIA) 10 MG tablet TAKE 1 TABLET(10 MG) BY MOUTH DAILY 90 tablet 1  . Flaxseed, Linseed, (FLAXSEED OIL  PO) Take by mouth daily.    . fluticasone (FLONASE) 50 MCG/ACT nasal spray Place 2 sprays into both nostrils daily. 16 g 0  . gabapentin (NEURONTIN) 100 MG capsule Take 1 capsule 3 x /day for burning pains in thighs 90 capsule 2  . Lutein 20 MG TABS Take by mouth.    . Multiple Vitamins-Minerals (WOMENS MULTI VITAMIN & MINERAL PO) Take 1 tablet by mouth daily.    . Omega-3 Fatty Acids (FISH OIL PO) Take by mouth daily.    Marland Kitchen omeprazole (PRILOSEC) 40 MG capsule Take 40 mg by mouth daily.    Marland Kitchen OVER THE COUNTER MEDICATION Patient takes MOM as needed.    Marland Kitchen OVER THE COUNTER MEDICATION     . Probiotic Product (ALIGN) 4 MG CAPS Take by mouth daily.    . RESTASIS 0.05 % ophthalmic emulsion instill 1 drop into both eyes twice a day  0   No current facility-administered medications on file prior to visit.     Allergies  Allergen Reactions  . Bactrim [Sulfamethoxazole-Trimethoprim] Other (See Comments)    Patient preference to take medication without sulfa    Current Problems (verified) Patient Active Problem List   Diagnosis Date Noted  . 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 Osteoporosis 08/11/2017  . Abnormal glucose 06/25/2017  . 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 GERD (gastroesophageal reflux disease)   . Vitamin D deficiency   . Hyperlipidemia     Screening Tests Immunization History  Administered Date(s) Administered  . Influenza, High Dose Seasonal PF 06/25/2017  . Influenza-Unspecified 03/24/2018  . PPD Test 12/12/2014, 12/26/2015, 01/15/2017  . Pneumococcal Conjugate-13 11/10/2018  . Td 05/18/2007  . Zoster 05/17/2006   Preventative care: Last colonoscopy: 10/2018 [facility] Last mammogram: 01/2018 Last pap smear/pelvic exam: 04/06/2017 DEXA: 07/2017 CXR 12/2016 MRI brain 2014 CT AB 2015 Echo 03/2018  Prior vaccinations: TD or Tdap: 2008 due will get next time  Influenza: 2019 Pneumococcal: N/A Prevnar13: 2020 Shingles/Zostavax: N/A  Names of Other Physician/Practitioners you  currently use: 1. [facility] here for primary care 2. Dr. Kathlen Mody, eye doctor, wears glasses 2020 3. Dr. Johnnye Sima, dentist 2020 Patient Care Team: Unk Pinto, MD as PCP - General (Internal Medicine) Juanita Craver, MD as Consulting Physician (Gastroenterology) Monna Fam, MD as Consulting Physician (Ophthalmology) Charolette Forward, MD as Consulting Physician (Cardiology)  SURGICAL HISTORY She  has a past surgical history that includes Cardiac catheterization (2011) and Radioactive seed guided excisional breast biopsy (Right, 03/11/2016). FAMILY HISTORY Her family history includes Cancer in her maternal grandmother; Diabetes in her father; Heart failure in her father; Hypertension in her mother. SOCIAL HISTORY She  reports that she has never smoked. She has never used smokeless tobacco. She reports current alcohol use. She reports that she does not use drugs.   MEDICARE WELLNESS OBJECTIVES: Physical activity: Current  Exercise Habits: Home exercise routine, Type of exercise: walking, Time (Minutes): 30, Frequency (Times/Week): 3, Weekly Exercise (Minutes/Week): 90, Intensity: Mild Cardiac risk factors: Cardiac Risk Factors include: advanced age (>42men, >57 women);sedentary lifestyle Depression/mood screen:   Depression screen [facility] 2/9 11/10/2018  Decreased Interest 0  Down, Depressed, Hopeless 0  PHQ - 2 Score 0    ADLs:  In your present state of health, do you have any difficulty performing the following activities: 11/10/2018 08/09/2018  Hearing? N N  Vision? N N  Difficulty concentrating or making decisions? N N  Walking or climbing stairs? N N  Dressing or bathing? N N  Doing errands, shopping? N N  Some recent data might be hidden     Cognitive Testing  Alert? Yes  Normal Appearance?Yes  Oriented to person? Yes  Place? Yes   Time? Yes  Recall of three objects?  Yes  Can perform simple calculations? Yes  Displays appropriate  judgment?Yes  Can read the correct time from a watch face?Yes  EOL planning: Does Patient Have a Medical Advance Directive?: No Would patient like information on creating a medical advance directive?: Yes (MAU/Ambulatory/Procedural Areas - Information given)  Review of Systems  Constitutional: Negative.   HENT: Negative.   Eyes: Negative.   Respiratory: Negative.   Cardiovascular: Negative.   Gastrointestinal: Negative.   Genitourinary: Negative.   Musculoskeletal: Negative.   Skin: Negative.   Neurological: Negative.   Endo/Heme/Allergies: Negative.   Psychiatric/Behavioral: Negative.     Objective:     Today's Vitals   11/10/18 1402  BP: 122/80  Pulse: 74  Temp: 97.9 F (36.6 C)  SpO2: 98%  Weight: 141 lb 9.6 oz (64.2 kg)  Height: 5\' 5"  (1.651 m)   Body mass index is 23.56 kg/m.  General appearance: alert, no distress, WD/WN, female HEENT: normocephalic, sclerae anicteric, TMs pearly after bilateral cerumen removal, nares patent, no discharge or erythema, pharynx normal, nontender temporal  Oral cavity: MMM, no lesions Neck: supple, no lymphadenopathy, no thyromegaly, no masses Heart: RRR, normal S1, S2, no murmurs Lungs: CTA bilaterally, no wheezes, rhonchi, or rales Abdomen: +bs, soft, + epigastric tenderness, non distended, no masses, no hepatomegaly, no splenomegaly Musculoskeletal: nontender, no swelling, no obvious deformity Extremities: no edema, no cyanosis, no clubbing, Bilateral ankle is normal, no swelling, pain, full ROM. Right foot + pain at 3rd and 4th intermetatarsal space, + lateral squeeze test. Good distal neurovascular exam.  Pulses: 2+ symmetric, upper and lower extremities, normal cap refill Neurological: alert, oriented x 3, CN2-12 intact, strength normal upper extremities and lower extremities, sensation normal throughout, DTRs 2+ throughout, no cerebellar signs, gait normal Psychiatric: normal affect, behavior normal, pleasant    Medicare  Attestation I have personally reviewed: The patient's medical and social history Their use of alcohol, tobacco or illicit drugs Their current medications and supplements The patient's functional ability including ADLs,fall risks, home safety risks, cognitive, and hearing and visual impairment Diet and physical activities Evidence for depression or mood disorders  The patient's weight, height, BMI, and visual acuity have been recorded in the chart.  I have made referrals, counseling, and provided education to the patient based on review of the above and I have provided the patient with a written personalized care plan for preventive services.     Vicie Mutters, PA-C   11/10/2018

## 2018-11-10 ENCOUNTER — Encounter: Payer: Self-pay | Admitting: Physician Assistant

## 2018-11-10 ENCOUNTER — Ambulatory Visit: Payer: Medicare Other | Admitting: Physician Assistant

## 2018-11-10 ENCOUNTER — Other Ambulatory Visit: Payer: Self-pay

## 2018-11-10 VITALS — BP 122/80 | HR 74 | Temp 97.9°F | Ht 65.0 in | Wt 141.6 lb

## 2018-11-10 DIAGNOSIS — I499 Cardiac arrhythmia, unspecified: Secondary | ICD-10-CM

## 2018-11-10 DIAGNOSIS — Z23 Encounter for immunization: Secondary | ICD-10-CM

## 2018-11-10 DIAGNOSIS — K219 Gastro-esophageal reflux disease without esophagitis: Secondary | ICD-10-CM

## 2018-11-10 DIAGNOSIS — R0989 Other specified symptoms and signs involving the circulatory and respiratory systems: Secondary | ICD-10-CM | POA: Diagnosis not present

## 2018-11-10 DIAGNOSIS — Z0000 Encounter for general adult medical examination without abnormal findings: Secondary | ICD-10-CM

## 2018-11-10 DIAGNOSIS — R7309 Other abnormal glucose: Secondary | ICD-10-CM

## 2018-11-10 DIAGNOSIS — G5761 Lesion of plantar nerve, right lower limb: Secondary | ICD-10-CM

## 2018-11-10 DIAGNOSIS — E785 Hyperlipidemia, unspecified: Secondary | ICD-10-CM

## 2018-11-10 DIAGNOSIS — M810 Age-related osteoporosis without current pathological fracture: Secondary | ICD-10-CM

## 2018-11-10 DIAGNOSIS — R6889 Other general symptoms and signs: Secondary | ICD-10-CM | POA: Diagnosis not present

## 2018-11-10 DIAGNOSIS — I498 Other specified cardiac arrhythmias: Secondary | ICD-10-CM

## 2018-11-10 DIAGNOSIS — Z79899 Other long term (current) drug therapy: Secondary | ICD-10-CM

## 2018-11-10 DIAGNOSIS — Z0001 Encounter for general adult medical examination with abnormal findings: Secondary | ICD-10-CM | POA: Diagnosis not present

## 2018-11-10 DIAGNOSIS — E559 Vitamin D deficiency, unspecified: Secondary | ICD-10-CM

## 2018-11-10 NOTE — Progress Notes (Signed)
Patient has declined to have her ears cleaned in office.  Patient states she will do that at home.

## 2018-11-10 NOTE — Patient Instructions (Addendum)
Call Dr. Earlean Shawl about your stomach Please go to the ER if you have any severe AB pain, unable to hold down food/water, blood in stool or vomit, chest pain, shortness of breath, or any worsening symptoms.    Morton Neuralgia  Morton neuralgia is foot pain that affects the ball of the foot and the area near the toes. Morton neuralgia occurs when part of a nerve in the foot (digital nerve) is under too much pressure (compressed). When this happens over a long period of time, the nerve can thicken (neuroma) and cause pain. Pain usually occurs between the third and fourth toes.  Morton neuralgia can come and go but may get worse over time. What are the causes? This condition is caused by doing the same things over and over with your foot, such as:  Activities such as running or jumping.  Wearing shoes that are too tight. What increases the risk? You may be at higher risk for Morton neuralgia if you:  Are female.  Wear high heels.  Wear shoes that are narrow or tight.  Do activities that repeatedly stretch your toes, such as: ? Running. ? [place]. ? Long-distance walking. What are the signs or symptoms? The first symptom of Morton neuralgia is pain that spreads from the ball of the foot to the toes. It may feel like you are walking on a marble. Pain usually gets worse with walking and goes away at night. Other symptoms may include numbness and cramping of your toes. Both feet are equally affected, but rarely at the same time. How is this diagnosed? This condition is diagnosed based on your symptoms, your medical history, and a physical exam. Your health care provider may:  Squeeze your foot just behind your toe.  Ask you to move your toes to check for pain.  Ask about your physical activity level. You also may have imaging tests, such as an X-ray, ultrasound, or MRI. How is this treated? Treatment depends on how severe your condition is and what causes it. Treatment may  involve:  Wearing different shoes that are not too tight, are low-heeled, and provide good support. For some people, this is the only treatment needed.  Wearing an over-the-counter or custom supportive pad (orthotic) under the front of your foot.  Getting injections of numbing medicine and anti-inflammatory medicine (steroid) in the nerve.  Having surgery to remove part of the thickened nerve. Follow these instructions at home: Managing pain, stiffness, and swelling   Massage your foot as needed.  Wear orthotics as told by your health care provider.  If directed, put ice on your foot: ? Put ice in a plastic bag. ? Place a towel between your skin and the bag. ? Leave the ice on for 20 minutes, 2-3 times a day.  Avoid activities that cause pain or make pain worse. If you play sports, ask your health care provider when it is safe for you to return to sports.  Raise (elevate) your foot above the level of your heart while lying down and, when possible, while sitting. General instructions  Take over-the-counter and prescription medicines only as told by your health care provider.  Do not drive or use heavy machinery while taking prescription pain medicine.  Wear shoes that: ? Have soft soles. ? Have a wide toe area. ? Provide arch support. ? Do not pinch or squeeze your feet. ? Have room for your orthotics, if applicable.  Keep all follow-up visits as told by your health care provider.  This is important. Contact a health care provider if:  Your symptoms get worse or do not get better with treatment and home care. Summary  Morton neuralgia is foot pain that affects the ball of the foot and the area near the toes. Pain usually occurs between the third and fourth toes, gets worse with walking, and goes away at night.  Morton neuralgia occurs when part of a nerve in the foot (digital nerve) is under too much pressure. When this happens over a long period of time, the nerve can  thicken (neuroma) and cause pain.  This condition is caused by doing the same things over and over with your foot, such as running or jumping, wearing shoes that are too tight, or wearing high heels.  Treatment may involve wearing low-heeled shoes that are not too tight, wearing a supportive pad (orthotic) under the front of your foot, getting injections in the nerve, or having surgery to remove part of the thickened nerve. This information is not intended to replace advice given to you by your health care provider. Make sure you discuss any questions you have with your health care provider. Document Released: 09/08/2000 Document Revised: 06/16/2017 Document Reviewed: 06/16/2017 Elsevier Interactive Patient Education  2019 [place] lower leg venous system is not the most reliable, the heart does NOT pump fluid up, there is a valve system.  The muscles of the leg squeeze and the blood moves up and a valve opens and close, then they squeeze, blood moves up and valves open and closes keeping the blood moving towards the heart.  Lots can go wrong with this valve system.  If someone is sitting or standing without movement, everyone will get swelling.  THINGS TO DO:  Do not stand or sit in one position for long periods of time. Do not sit with your legs crossed. Rest with your legs raised during the day.  Your legs have to be higher than your heart so that gravity will force the valves to open, so please really elevate your legs.   Wear elastic stockings or support hose. Do not wear other tight, encircling garments around the legs, pelvis, or waist.  [employer]  has a wide variety of well priced compression stockings. [place], [place] [address] [id number] [place] has a good cheap selection, I like the socks, they are not as hard to get on  Walk as much as possible to increase blood flow.  Raise the foot of your bed at night with 2-inch  blocks.  SEEK MEDICAL CARE IF:   The skin around your ankle starts to break down.  You have pain, redness, tenderness, or hard swelling developing in your leg over a vein.  You are uncomfortable due to leg pain.  If you ever have shortness of breath with exertion or chest pain go to the ER.

## 2018-11-11 ENCOUNTER — Other Ambulatory Visit: Payer: Self-pay | Admitting: Physician Assistant

## 2018-11-11 LAB — HEMOGLOBIN A1C
Hgb A1c MFr Bld: 5.6 % of total Hgb (ref <5.7)
Mean Plasma Glucose: 114 (calc)
eAG (mmol/L): 6.3 (calc)

## 2018-11-11 LAB — COMPLETE METABOLIC PANEL WITH GFR
AG Ratio: 1.6 (calc) (ref 1.0–2.5)
ALT: 14 U/L (ref 6–29)
AST: 18 U/L (ref 10–35)
Albumin: 4.1 g/dL (ref 3.6–5.1)
Alkaline phosphatase (APISO): 80 U/L (ref 37–153)
BUN: 13 mg/dL (ref 7–25)
CO2: 27 mmol/L (ref 20–32)
Calcium: 9.4 mg/dL (ref 8.6–10.4)
Chloride: 104 mmol/L (ref 98–110)
Creat: 0.68 mg/dL (ref 0.50–0.99)
GFR, Est African American: 106 mL/min/{1.73_m2} (ref >=60)
GFR, Est Non African American: 91 mL/min/{1.73_m2} (ref >=60)
Globulin: 2.6 g/dL (calc) (ref 1.9–3.7)
Glucose, Bld: 78 mg/dL (ref 65–99)
Potassium: 4.4 mmol/L (ref 3.5–5.3)
Sodium: 138 mmol/L (ref 135–146)
Total Bilirubin: 0.4 mg/dL (ref 0.2–1.2)
Total Protein: 6.7 g/dL (ref 6.1–8.1)

## 2018-11-11 LAB — CBC WITH DIFFERENTIAL/PLATELET
Absolute Monocytes: 247 cells/uL (ref 200–950)
Basophils Absolute: 29 cells/uL (ref 0–200)
Basophils Relative: 1 %
Eosinophils Absolute: 90 cells/uL (ref 15–500)
Eosinophils Relative: 3.1 %
HCT: 37.7 % (ref 35.0–45.0)
Hemoglobin: 12.3 g/dL (ref 11.7–15.5)
Lymphs Abs: 1534 cells/uL (ref 850–3900)
MCH: 28.5 pg (ref 27.0–33.0)
MCHC: 32.6 g/dL (ref 32.0–36.0)
MCV: 87.3 fL (ref 80.0–100.0)
MPV: 11.6 fL (ref 7.5–12.5)
Monocytes Relative: 8.5 %
Neutro Abs: 1001 cells/uL — ABNORMAL LOW (ref 1500–7800)
Neutrophils Relative %: 34.5 %
Platelets: 190 10*3/uL (ref 140–400)
RBC: 4.32 10*6/uL (ref 3.80–5.10)
RDW: 12.6 % (ref 11.0–15.0)
Total Lymphocyte: 52.9 %
WBC: 2.9 10*3/uL — ABNORMAL LOW (ref 3.8–10.8)

## 2018-11-11 LAB — LIPID PANEL
Cholesterol: 207 mg/dL — ABNORMAL HIGH (ref <200)
HDL: 71 mg/dL (ref >=50)
LDL Cholesterol (Calc): 121 mg/dL (calc) — ABNORMAL HIGH
Non-HDL Cholesterol (Calc): 136 mg/dL (calc) — ABNORMAL HIGH (ref <130)
Total CHOL/HDL Ratio: 2.9 (calc) (ref <5.0)
Triglycerides: 61 mg/dL (ref <150)

## 2018-11-11 LAB — MAGNESIUM: Magnesium: 2.1 mg/dL (ref 1.5–2.5)

## 2018-11-11 LAB — VITAMIN D 25 HYDROXY (VIT D DEFICIENCY, FRACTURES): Vit D, 25-Hydroxy: 74 ng/mL (ref 30–100)

## 2018-11-11 LAB — TSH: TSH: 1.67 mIU/L (ref 0.40–4.50)

## 2018-12-27 DIAGNOSIS — M25561 Pain in right knee: Secondary | ICD-10-CM | POA: Insufficient documentation

## 2019-01-18 ENCOUNTER — Other Ambulatory Visit: Payer: Self-pay

## 2019-01-18 ENCOUNTER — Encounter: Payer: Self-pay | Admitting: Adult Health Nurse Practitioner

## 2019-01-18 ENCOUNTER — Ambulatory Visit: Payer: Medicare Other | Admitting: Adult Health Nurse Practitioner

## 2019-01-18 ENCOUNTER — Other Ambulatory Visit: Payer: Self-pay | Admitting: Adult Health Nurse Practitioner

## 2019-01-18 VITALS — BP 120/68 | HR 61 | Temp 98.6°F | Wt 145.2 lb

## 2019-01-18 DIAGNOSIS — J392 Other diseases of pharynx: Secondary | ICD-10-CM | POA: Diagnosis not present

## 2019-01-18 DIAGNOSIS — H6123 Impacted cerumen, bilateral: Secondary | ICD-10-CM | POA: Diagnosis not present

## 2019-01-18 DIAGNOSIS — K219 Gastro-esophageal reflux disease without esophagitis: Secondary | ICD-10-CM

## 2019-01-18 DIAGNOSIS — H6503 Acute serous otitis media, bilateral: Secondary | ICD-10-CM | POA: Diagnosis not present

## 2019-01-18 MED ORDER — FAMOTIDINE 20 MG PO TABS: 20.0000 mg | ORAL_TABLET | Freq: Every day | ORAL | 1 refills | Status: DC

## 2019-01-18 NOTE — Progress Notes (Signed)
Assessment and Plan:  [name] was seen today for acute visit and other.  Diagnoses and all orders for this visit:  Discussed symptoms combination of increased reflux and sinus drainage causing throat irritation.  Patient to continue to monitor for symptoms/blood.   Bilateral impacted cerumen -     Ear Lavage, canals clear Ear hygiene and maintenance discussed with patient  Gastroesophageal reflux disease, esophagitis presence not specified Having breakthrough reflux -Continue Omeprazole 40mg  daily in am. ADD -     famotidine (PEPCID) 20 MG tablet; Take 1 tablet (20 mg total) by mouth at bedtime. May take second pill during day if symptomatic.   Throat dryness Gargle with salt water 3-4 times a day Increase water intake Continue to monitor symptoms/triggers, contact office with new or worsening  Non-recurrent acute serous otitis media, bilateral Take zyrtec nightly    Discussed med's effects and SE's.   Over 30 minutes of interview, exam, counseling, chart review, and critical decision making was performed.   Future Appointments  Date Time Provider [place]  02/17/2019  9:00 AM Unk Pinto, MD [facility] None  04/13/2019  9:30 AM Phineas Real, Belinda Block, MD [facility] [name]  11/15/2019  2:00 PM Vicie Mutters, PA-C [facility] None    ------------------------------------------------------------------------------------------------------------------   HPI 68 y.o.female presents for evaluation of blood from brushing her teeth.  Reports last night she brushed the soft tissue behind her tongue as her throat felt so dry.  She the spit two chunks of blood into the sink.   She first notice symptoms two weeks ago that began with some throat drainage.  She has also had congestion and drainage and hoarseness with throat clearing with dryness of her throat. She has taken zyrtec, not regularly, Flonase daily and recently mucinex liquid.  Denies any cough, fever, dysphagia.  She reports she  drinks at least 60-70 oz a day.   She reports she had a dental exam in February.  She reports she does floss regularly and denies any bleeding from this.  Reports that she has a left upper gum line that gets tender if food gets stuck in that area, this has been chronic.  She has history of GERD and reports that she is taking ompeazole daily in the morning.  She reports she sleeps propped up with pillows at night related to her reflux.  She monitors her diet and avoids triggers.  Reports that she is having increasing reflux at night when she goes to bed.  She has not taken any OTC medications for this.  Reports this sometimes wakes her up at night.   Past Medical History:  Diagnosis Date  . Arthritis    cervical and lumbar  . Depression   . GERD (gastroesophageal reflux disease)   . Hyperlipidemia   . IBS (irritable bowel syndrome)   . Prediabetes   . Vitamin D deficiency      Allergies  Allergen Reactions  . Bactrim [Sulfamethoxazole-Trimethoprim] Other (See Comments)    Patient preference to take medication without sulfa    Current Outpatient Medications on File Prior to Visit  Medication Sig  . aspirin 81 MG tablet Take 81 mg by mouth daily.  . cetirizine (ZYRTEC) 10 MG tablet Take 10 mg by mouth daily as needed for allergies.  . Cholecalciferol (D-3-5) 5000 UNITS capsule Take 5,000 Units by mouth daily.  Marland Kitchen conjugated estrogens (PREMARIN) vaginal cream Place 1 Applicatorful vaginally daily.  Marland Kitchen ezetimibe (ZETIA) 10 MG tablet TAKE 1 TABLET(10 MG) BY MOUTH DAILY  .  Flaxseed, Linseed, (FLAXSEED OIL PO) Take by mouth daily.  . fluticasone (FLONASE) 50 MCG/ACT nasal spray Place 2 sprays into both nostrils daily.  Marland Kitchen gabapentin (NEURONTIN) 100 MG capsule Take 1 capsule 3 x /day for burning pains in thighs  . Lutein 20 MG TABS Take by mouth.  . Multiple Vitamins-Minerals (WOMENS MULTI VITAMIN & MINERAL PO) Take 1 tablet by mouth daily.  . Omega-3 Fatty Acids (FISH OIL PO) Take by mouth  daily.  Marland Kitchen omeprazole (PRILOSEC) 40 MG capsule Take 40 mg by mouth daily.  Marland Kitchen OVER THE COUNTER MEDICATION Patient takes MOM as needed.  Marland Kitchen OVER THE COUNTER MEDICATION   . Probiotic Product (ALIGN) 4 MG CAPS Take by mouth daily.  . RESTASIS 0.05 % ophthalmic emulsion instill 1 drop into both eyes twice a day   No current facility-administered medications on file prior to visit.     ROS: all negative except above.   Physical Exam:  BP 120/68   Pulse 61   Temp 98.6 F (37 C)   Wt 145 lb 3.2 oz (65.9 kg)   SpO2 97%   BMI 24.16 kg/m   General Appearance: Well nourished, in no apparent distress. Eyes: PERRLA, EOMs, conjunctiva no swelling or erythema Sinuses: No Frontal/maxillary tenderness ENT/Mouth: Ext aud canals clear cerumen impaction bilateral.  Tonsils not swollen or erythematous. Hearing normal.  Neck: Supple, thyroid normal.  Respiratory: Respiratory effort normal, BS equal bilaterally without rales, rhonchi, wheezing or stridor.  Cardio: RRR with no MRGs. Brisk peripheral pulses without edema.  Abdomen: Soft, + BS.  Non tender, no guarding, rebound, hernias, masses. Lymphatics: Non tender without lymphadenopathy.  Musculoskeletal: Full ROM, 5/5 strength, normal gait.  Skin: Warm, dry without rashes, lesions, ecchymosis.  Neuro: Cranial nerves intact. Normal muscle tone, no cerebellar symptoms. Sensation intact.  Psych: Awake and oriented X 3, normal affect, Insight and Judgment appropriate.   Ear Lavage Preformed: TMs without erythema, bulging. No erythema, swelling, or exudate on post pharynx.    Jodi Sierras, NP 10:50 AM [facility]

## 2019-01-18 NOTE — Patient Instructions (Addendum)
   Ear care at home  Use a dropper or use a cap to put peroxide in ear twice a week.  Once cap full in one ear leave for 1-3 min.  Do both ears. Then use 3-4 drops of olive oil,mineral oil or canola oil in the effected ear for three nights.  Four day rinse ears with warm water while in the shower.  Do not use Qtips  For your throat: Gargle with a mixture of warm water and 1/2 teaspoon of salt  3-4 times a day.   We will send in Famotadine 20mg  for you to take a bedtime to help with breakthrough reflux.  You may take one during the day also IF you have symptoms.  Continue taking the omeprazole 40mg  daily.  Continue to monitor any bleeding from gums or soft tissue in the mouth.  Contact office with any new or worsening symptoms as we discussd in the appointment today.

## 2019-01-26 LAB — HM MAMMOGRAPHY

## 2019-02-01 ENCOUNTER — Encounter: Payer: Self-pay | Admitting: Internal Medicine

## 2019-02-16 ENCOUNTER — Encounter: Payer: Self-pay | Admitting: Internal Medicine

## 2019-02-16 NOTE — Patient Instructions (Signed)

## 2019-02-16 NOTE — Progress Notes (Signed)
Annual Screening/Preventative Visit & Comprehensive Evaluation &  Examination     This very nice 68 y.o. MBF presents for a Screening /Preventative Visit & comprehensive evaluation and management of multiple medical co-morbidities.  Patient has been followed with Labile  HTN, HLD, Prediabetes and Vitamin D Deficiency. Other problems include IBS-C  & GERD - both quiescent on her meds & also followed by Dr Earlean Shawl. In Feb dexaBMD showed Osteoporosis T-3.6 and patient was reticent to start meds.      Patient has been followed expectantly for labile HTN and her  BP has been controlled at home.  In 2011, she had a Negative / Normal Cardiac Cath by Dr Terrence Dupont. She denies any cardiac symptoms as chest pain, palpitations, shortness of breath, dizziness or ankle swelling. Today's BP is at goal - 108/68.      Patient's hyperlipidemia is not controlled with diet, Flaxseed oil  and Zetia, as she has been resistant to take Statin meds. Patient denies myalgias or other medication SE's. Last lipids were not at goal: Lab Results  Component Value Date   CHOL 207 (H) 11/10/2018   HDL 71 11/10/2018   LDLCALC 121 (H) 11/10/2018   TRIG 61 11/10/2018   CHOLHDL 2.9 11/10/2018      Patient has hx/o prediabetes  (A1c 5.7% / 2014 & 6.1% / 2016)  and patient denies reactive hypoglycemic symptoms, visual blurring, diabetic polys or paresthesias. Last A1c was Normal & at goal: Lab Results  Component Value Date   HGBA1C 5.6 11/10/2018      Finally, patient has history of Vitamin D Deficiency and last Vitamin D was at goal: Lab Results  Component Value Date   VD25OH 74 11/10/2018   Current Outpatient Medications on File Prior to Visit  Medication Sig  . aspirin 81 MG tablet Take 81 mg by mouth daily.  . cetirizine (ZYRTEC) 10 MG tablet Take 10 mg by mouth daily as needed for allergies.  . Cholecalciferol (D-3-5) 5000 UNITS capsule Take 5,000 Units by mouth daily.  . famotidine (PEPCID) 20 MG tablet TAKE 1 TABLET(20  MG) BY MOUTH AT BEDTIME. MAY TAKE SECOND PILL DURING DAY IF SYMPTOMATIC  . Flaxseed, Linseed, (FLAXSEED OIL PO) Take by mouth daily.  . fluticasone (FLONASE) 50 MCG/ACT nasal spray Place 2 sprays into both nostrils daily.  Marland Kitchen gabapentin (NEURONTIN) 100 MG capsule Take 1 capsule 3 x /day for burning pains in thighs  . Lutein 20 MG TABS Take by mouth.  . Multiple Vitamins-Minerals (WOMENS MULTI VITAMIN & MINERAL PO) Take 1 tablet by mouth daily.  . Omega-3 Fatty Acids (FISH OIL PO) Take by mouth daily.  Marland Kitchen omeprazole (PRILOSEC) 40 MG capsule Take 40 mg by mouth daily.  Marland Kitchen OVER THE COUNTER MEDICATION Patient takes MOM as needed.  Marland Kitchen OVER THE COUNTER MEDICATION   . Probiotic Product (ALIGN) 4 MG CAPS Take by mouth daily.  . RESTASIS 0.05 % ophthalmic emulsion instill 1 drop into both eyes twice a day   No current facility-administered medications on file prior to visit.    Allergies  Allergen Reactions  . Bactrim [Sulfamethoxazole-Trimethoprim] Other (See Comments)    Patient preference to take medication without sulfa   Past Medical History:  Diagnosis Date  . Arthritis    cervical and lumbar  . Depression   . GERD (gastroesophageal reflux disease)   . Hyperlipidemia   . IBS (irritable bowel syndrome)   . Prediabetes   . Vitamin D deficiency    Health Maintenance  Topic Date Due  . TETANUS/TDAP  05/17/2017  . INFLUENZA VACCINE  01/15/2019  . PNA vac Low Risk Adult (2 of 2 - PPSV23) 11/10/2019  . MAMMOGRAM  01/26/2020  . COLONOSCOPY  10/17/2028  . DEXA SCAN  Completed  . Hepatitis C Screening  Completed   Immunization History  Administered Date(s) Administered  . Influenza, High Dose Seasonal PF 06/25/2017  . Influenza-Unspecified 03/24/2018  . PPD Test 12/12/2014, 12/26/2015, 01/15/2017  . Pneumococcal Conjugate-13 11/10/2018  . Td 05/18/2007  . Zoster 05/17/2006   Last Colon - 10/18/2018 - Normal - Dr Earlean Shawl - Recc 10 yr f/u - due May 2030  Last MGM - 01/26/2019 Teola Bradley    Past Surgical History:  Procedure Laterality Date  . CARDIAC CATHETERIZATION  2011   normal (Dr. Terrence Dupont)  . RADIOACTIVE SEED GUIDED EXCISIONAL BREAST BIOPSY Right 03/11/2016   Procedure: RIGHT RADIOACTIVE SEED GUIDED EXCISIONAL BREAST BIOPSY;  Surgeon: Rolm Bookbinder, MD;  Location: [facility];  Service: General;  Laterality: Right;   Family History  Problem Relation Age of Onset  . Hypertension Mother   . Diabetes Father   . Heart failure Father   . Cancer Maternal Grandmother        Lung   Social History   Tobacco Use  . Smoking status: Never Smoker  . Smokeless tobacco: Never Used  Substance Use Topics  . Alcohol use: Yes    Comment: Rare  . Drug use: No    ROS Constitutional: Denies fever, chills, weight loss/gain, headaches, insomnia,  night sweats, and change in appetite. Does c/o fatigue. Eyes: Denies redness, blurred vision, diplopia, discharge, itchy, watery eyes.  ENT: Denies discharge, congestion, post nasal drip, epistaxis, sore throat, earache, hearing loss, dental pain, Tinnitus, Vertigo, Sinus pain, snoring.  Cardio: Denies chest pain, palpitations, irregular heartbeat, syncope, dyspnea, diaphoresis, orthopnea, PND, claudication, edema Respiratory: denies cough, dyspnea, DOE, pleurisy, hoarseness, laryngitis, wheezing.  Gastrointestinal: Denies dysphagia, heartburn, reflux, water brash, pain, cramps, nausea, vomiting, bloating, diarrhea, constipation, hematemesis, melena, hematochezia, jaundice, hemorrhoids Genitourinary: Denies dysuria, frequency, urgency, nocturia, hesitancy, discharge, hematuria, flank pain Breast: Breast lumps, nipple discharge, bleeding.  Musculoskeletal: Denies arthralgia, myalgia, stiffness, Jt. Swelling, pain, limp, and strain/sprain. Denies falls. Skin: Denies puritis, rash, hives, warts, acne, eczema, changing in skin lesion Neuro: No weakness, tremor, incoordination, spasms, paresthesia, pain Psychiatric: Denies  confusion, memory loss, sensory loss. Denies Depression. Endocrine: Denies change in weight, skin, hair change, nocturia, and paresthesia, diabetic polys, visual blurring, hyper / hypo glycemic episodes.  Heme/Lymph: No excessive bleeding, bruising, enlarged lymph nodes.  Physical Exam  BP 108/68   Pulse (!) 56   Temp (!) 97.4 F (36.3 C)   Resp 16   Ht 5\' 5"  (1.651 m)   Wt 146 lb 3.2 oz (66.3 kg)   BMI 24.33 kg/m   General Appearance: Well nourished, well groomed and in no apparent distress.  Eyes: PERRLA, EOMs, conjunctiva no swelling or erythema, normal fundi and vessels. Sinuses: No frontal/maxillary tenderness ENT/Mouth: EACs patent / TMs  nl. Nares clear without erythema, swelling, mucoid exudates. Oral hygiene is good. No erythema, swelling, or exudate. Tongue normal, non-obstructing. Tonsils not swollen or erythematous. Hearing normal.  Neck: Supple, thyroid not palpable. No bruits, nodes or JVD. Respiratory: Respiratory effort normal.  BS equal and clear bilateral without rales, rhonci, wheezing or stridor. Cardio: Heart sounds are normal with regular rate and rhythm and no murmurs, rubs or gallops. Peripheral pulses are normal and equal bilaterally without edema. No  aortic or femoral bruits. Chest: symmetric with normal excursions and percussion. Breasts: Symmetric, without lumps, nipple discharge, retractions, or fibrocystic changes.  Abdomen: Flat, soft with bowel sounds active. Nontender, no guarding, rebound, hernias, masses, or organomegaly.  Lymphatics: Non tender without lymphadenopathy.  Genitourinary:  Musculoskeletal: Full ROM all peripheral extremities, joint stability, 5/5 strength, and normal gait. Skin: Warm and dry without rashes, lesions, cyanosis, clubbing or  ecchymosis.  Neuro: Cranial nerves intact, reflexes equal bilaterally. Normal muscle tone, no cerebellar symptoms. Sensation intact.  Pysch: Alert and oriented X 3, normal affect, Insight and Judgment  appropriate.   Assessment and Plan  1. Annual Preventative Screening Examination  2. Labile hypertension  - EKG 12-Lead - Urinalysis, Routine w reflex microscopic - Microalbumin / creatinine urine ratio - CBC with Differential/Platelet - COMPLETE METABOLIC PANEL WITH GFR - Magnesium - TSH  3. Hyperlipidemia, mixed  - EKG 12-Lead - Lipid panel - TSH - ezetimibe (ZETIA) 10 MG tablet; Take 1 tablet Daily for Cholesterol  Dispense: 90 tablet; Refill: 3  4. Abnormal glucose  - Hemoglobin A1c  5. Vitamin D deficiency  - VITAMIN D 25 Hydroxyl  6. Prediabetes  - EKG 12-Lead - Hemoglobin A1c - Insulin, random  7. Screening for colorectal cancer  - POC Hemoccult Bld/Stle  8. Screening for ischemic heart disease  - EKG 12-Lead  9. FHx: heart disease  - EKG 12-Lead  10. Medication management  - Urinalysis, Routine w reflex microscopic - Microalbumin / creatinine urine ratio - CBC with Differential/Platelet - COMPLETE METABOLIC PANEL WITH GFR - Magnesium - Lipid panel - TSH - Hemoglobin A1c - Insulin, random - VITAMIN D 25 Hydroxy        Patient was counseled in prudent diet to achieve/maintain BMI less than 25 for weight control, BP monitoring, regular exercise and medications. Discussed med's effects and SE's. Screening labs and tests as requested with regular follow-up as recommended. Over 40 minutes of exam, counseling, chart review and high complex critical decision making was performed.   Kirtland Bouchard, MD

## 2019-02-17 ENCOUNTER — Ambulatory Visit (INDEPENDENT_AMBULATORY_CARE_PROVIDER_SITE_OTHER): Payer: Medicare Other | Admitting: Internal Medicine

## 2019-02-17 ENCOUNTER — Encounter: Payer: Self-pay | Admitting: Internal Medicine

## 2019-02-17 ENCOUNTER — Other Ambulatory Visit: Payer: Self-pay

## 2019-02-17 VITALS — BP 108/68 | HR 56 | Temp 97.4°F | Resp 16 | Ht 65.0 in | Wt 146.2 lb

## 2019-02-17 DIAGNOSIS — R0989 Other specified symptoms and signs involving the circulatory and respiratory systems: Secondary | ICD-10-CM

## 2019-02-17 DIAGNOSIS — I10 Essential (primary) hypertension: Secondary | ICD-10-CM

## 2019-02-17 DIAGNOSIS — Z136 Encounter for screening for cardiovascular disorders: Secondary | ICD-10-CM | POA: Diagnosis not present

## 2019-02-17 DIAGNOSIS — Z8249 Family history of ischemic heart disease and other diseases of the circulatory system: Secondary | ICD-10-CM

## 2019-02-17 DIAGNOSIS — R7309 Other abnormal glucose: Secondary | ICD-10-CM

## 2019-02-17 DIAGNOSIS — E559 Vitamin D deficiency, unspecified: Secondary | ICD-10-CM

## 2019-02-17 DIAGNOSIS — Z1211 Encounter for screening for malignant neoplasm of colon: Secondary | ICD-10-CM

## 2019-02-17 DIAGNOSIS — Z0000 Encounter for general adult medical examination without abnormal findings: Secondary | ICD-10-CM

## 2019-02-17 DIAGNOSIS — Z0001 Encounter for general adult medical examination with abnormal findings: Secondary | ICD-10-CM

## 2019-02-17 DIAGNOSIS — E782 Mixed hyperlipidemia: Secondary | ICD-10-CM

## 2019-02-17 DIAGNOSIS — R7303 Prediabetes: Secondary | ICD-10-CM

## 2019-02-17 DIAGNOSIS — Z79899 Other long term (current) drug therapy: Secondary | ICD-10-CM

## 2019-02-17 MED ORDER — EZETIMIBE 10 MG PO TABS: ORAL_TABLET | ORAL | 3 refills | Status: DC

## 2019-02-18 LAB — URINALYSIS, ROUTINE W REFLEX MICROSCOPIC
Bacteria, UA: NONE SEEN /HPF
Bilirubin Urine: NEGATIVE
Glucose, UA: NEGATIVE
Hgb urine dipstick: NEGATIVE
Hyaline Cast: NONE SEEN /LPF
Ketones, ur: NEGATIVE
Nitrite: NEGATIVE
Protein, ur: NEGATIVE
RBC / HPF: NONE SEEN /HPF (ref 0–2)
Specific Gravity, Urine: 1.005 (ref 1.001–1.03)
Squamous Epithelial / LPF: NONE SEEN /HPF (ref <=5)
WBC, UA: NONE SEEN /HPF (ref 0–5)
pH: 7.5 (ref 5.0–8.0)

## 2019-02-18 LAB — MICROALBUMIN / CREATININE URINE RATIO
Creatinine, Urine: 27 mg/dL (ref 20–275)
Microalb, Ur: <0.2 mg/dL

## 2019-02-18 LAB — COMPLETE METABOLIC PANEL WITH GFR
AG Ratio: 1.5 (calc) (ref 1.0–2.5)
ALT: 12 U/L (ref 6–29)
AST: 19 U/L (ref 10–35)
Albumin: 3.8 g/dL (ref 3.6–5.1)
Alkaline phosphatase (APISO): 78 U/L (ref 37–153)
BUN: 11 mg/dL (ref 7–25)
CO2: 28 mmol/L (ref 20–32)
Calcium: 9.3 mg/dL (ref 8.6–10.4)
Chloride: 105 mmol/L (ref 98–110)
Creat: 0.76 mg/dL (ref 0.50–0.99)
GFR, Est African American: 95 mL/min/{1.73_m2} (ref >=60)
GFR, Est Non African American: 82 mL/min/{1.73_m2} (ref >=60)
Globulin: 2.6 g/dL (calc) (ref 1.9–3.7)
Glucose, Bld: 89 mg/dL (ref 65–99)
Potassium: 4.3 mmol/L (ref 3.5–5.3)
Sodium: 140 mmol/L (ref 135–146)
Total Bilirubin: 0.5 mg/dL (ref 0.2–1.2)
Total Protein: 6.4 g/dL (ref 6.1–8.1)

## 2019-02-18 LAB — INSULIN, RANDOM: Insulin: 3.8 u[IU]/mL

## 2019-02-18 LAB — CBC WITH DIFFERENTIAL/PLATELET
Absolute Monocytes: 238 cells/uL (ref 200–950)
Basophils Absolute: 19 cells/uL (ref 0–200)
Basophils Relative: 0.7 %
Eosinophils Absolute: 78 cells/uL (ref 15–500)
Eosinophils Relative: 2.9 %
HCT: 36.5 % (ref 35.0–45.0)
Hemoglobin: 12.2 g/dL (ref 11.7–15.5)
Lymphs Abs: 1223 cells/uL (ref 850–3900)
MCH: 28.7 pg (ref 27.0–33.0)
MCHC: 33.4 g/dL (ref 32.0–36.0)
MCV: 85.9 fL (ref 80.0–100.0)
MPV: 11.5 fL (ref 7.5–12.5)
Monocytes Relative: 8.8 %
Neutro Abs: 1142 cells/uL — ABNORMAL LOW (ref 1500–7800)
Neutrophils Relative %: 42.3 %
Platelets: 168 10*3/uL (ref 140–400)
RBC: 4.25 10*6/uL (ref 3.80–5.10)
RDW: 12.6 % (ref 11.0–15.0)
Total Lymphocyte: 45.3 %
WBC: 2.7 10*3/uL — ABNORMAL LOW (ref 3.8–10.8)

## 2019-02-18 LAB — LIPID PANEL
Cholesterol: 192 mg/dL (ref <200)
HDL: 73 mg/dL (ref >=50)
LDL Cholesterol (Calc): 105 mg/dL (calc) — ABNORMAL HIGH
Non-HDL Cholesterol (Calc): 119 mg/dL (calc) (ref <130)
Total CHOL/HDL Ratio: 2.6 (calc) (ref <5.0)
Triglycerides: 54 mg/dL (ref <150)

## 2019-02-18 LAB — TSH: TSH: 1.37 mIU/L (ref 0.40–4.50)

## 2019-02-18 LAB — MAGNESIUM: Magnesium: 2 mg/dL (ref 1.5–2.5)

## 2019-02-18 LAB — HEMOGLOBIN A1C
Hgb A1c MFr Bld: 5.7 % of total Hgb — ABNORMAL HIGH (ref <5.7)
Mean Plasma Glucose: 117 (calc)
eAG (mmol/L): 6.5 (calc)

## 2019-02-18 LAB — VITAMIN D 25 HYDROXY (VIT D DEFICIENCY, FRACTURES): Vit D, 25-Hydroxy: 59 ng/mL (ref 30–100)

## 2019-03-23 ENCOUNTER — Encounter: Payer: Self-pay | Admitting: Gynecology

## 2019-03-30 ENCOUNTER — Ambulatory Visit: Payer: Medicare Other

## 2019-04-12 ENCOUNTER — Other Ambulatory Visit: Payer: Self-pay

## 2019-04-13 ENCOUNTER — Encounter: Payer: Self-pay | Admitting: Gynecology

## 2019-04-13 ENCOUNTER — Ambulatory Visit (INDEPENDENT_AMBULATORY_CARE_PROVIDER_SITE_OTHER): Payer: Medicare Other | Admitting: Gynecology

## 2019-04-13 VITALS — BP 118/76 | Ht 64.0 in | Wt 146.0 lb

## 2019-04-13 DIAGNOSIS — Z01419 Encounter for gynecological examination (general) (routine) without abnormal findings: Secondary | ICD-10-CM | POA: Diagnosis not present

## 2019-04-13 DIAGNOSIS — N952 Postmenopausal atrophic vaginitis: Secondary | ICD-10-CM | POA: Diagnosis not present

## 2019-04-13 DIAGNOSIS — R102 Pelvic and perineal pain: Secondary | ICD-10-CM

## 2019-04-13 DIAGNOSIS — M810 Age-related osteoporosis without current pathological fracture: Secondary | ICD-10-CM

## 2019-04-13 LAB — URINALYSIS, COMPLETE W/RFL CULTURE
Bilirubin Urine: NEGATIVE
Glucose, UA: NEGATIVE
Hgb urine dipstick: NEGATIVE
Ketones, ur: NEGATIVE
Leukocyte Esterase: NEGATIVE
Nitrites, Initial: NEGATIVE
Protein, ur: NEGATIVE
Specific Gravity, Urine: 1.006 (ref 1.001–1.03)
pH: 6.5 (ref 5.0–8.0)

## 2019-04-13 LAB — NO CULTURE INDICATED

## 2019-04-13 NOTE — Progress Notes (Signed)
    [name] Tissue 1951/02/10 [id number]        67 y.o.  G0P0000 for annual gynecologic exam.  Notes over the past several months bilateral lower pelvic discomfort.  Aching in nature.  Comes and goes.  No nausea vomiting diarrhea constipation.  Some urinary urgency but no dysuria frequency.  Past medical history,surgical history, problem list, medications, allergies, family history and social history were all reviewed and documented as reviewed in the EPIC chart.  ROS:  Performed with pertinent positives and negatives included in the history, assessment and plan.   Additional significant findings : None   Exam: [name] assistant Vitals:   04/13/19 0935  BP: 118/76  Weight: 146 lb (66.2 kg)  Height: 5\' 4"  (1.626 m)   Body mass index is 25.06 kg/m.  General appearance:  Normal affect, orientation and appearance. Skin: Grossly normal HEENT: Without gross lesions.  No cervical or supraclavicular adenopathy. Thyroid normal.  Lungs:  Clear without wheezing, rales or rhonchi Cardiac: RR, without RMG Abdominal:  Soft, nontender, without masses, guarding, rebound, organomegaly or hernia Breasts:  Examined lying and sitting without masses, retractions, discharge or axillary adenopathy. Pelvic:  Ext, BUS, Vagina: With atrophic changes  Cervix: With atrophic changes  Uterus: Anteverted, normal size, shape and contour, midline and mobile nontender   Adnexa: Without masses or tenderness    Anus and perineum: Normal   Rectovaginal: Normal sphincter tone without palpated masses or tenderness.    Assessment/Plan:  68 y.o. G0P0000 female for annual gynecologic exam.   1. Postmenopausal.  No significant menopausal symptoms or any vaginal bleeding. 2. Pelvic pain.  Comes and goes over the last several months.  No localizing symptoms.  Exam is normal.  Recommend ultrasound for pelvic clearance rule out ovarian process.  Patient will schedule in follow-up for this. 3. Osteoporosis.  DEXA  2019-3.6.  Is being followed by her primary provider with planned repeat DEXA next year at 2-year interval.  We have discussed her risks of fracture and options for treatment but she has declined treatment. 4. Mammography 01/2019.  Continue with annual mammography when due.  Breast exam normal today. 5. Colonoscopy 2020.  Repeat at their recommended interval. 6. Pap smear 03/2017.  No Pap smear done today.  No history of significant abnormal Pap smears.  Plan repeat Pap smear next year at 3-year interval. 7. Health maintenance.  No routine lab work done as patient does this elsewhere.  Follow-up 1 year, sooner as needed.   Anastasio Auerbach MD, 9:54 AM 04/13/2019

## 2019-04-13 NOTE — Patient Instructions (Signed)
Follow-up for the ultrasound as scheduled. 

## 2019-04-20 ENCOUNTER — Other Ambulatory Visit: Payer: Self-pay

## 2019-04-21 ENCOUNTER — Ambulatory Visit (INDEPENDENT_AMBULATORY_CARE_PROVIDER_SITE_OTHER): Payer: Medicare Other | Admitting: Gynecology

## 2019-04-21 ENCOUNTER — Ambulatory Visit (INDEPENDENT_AMBULATORY_CARE_PROVIDER_SITE_OTHER): Payer: Medicare Other

## 2019-04-21 ENCOUNTER — Encounter: Payer: Self-pay | Admitting: Gynecology

## 2019-04-21 VITALS — BP 122/76

## 2019-04-21 DIAGNOSIS — D259 Leiomyoma of uterus, unspecified: Secondary | ICD-10-CM

## 2019-04-21 DIAGNOSIS — R102 Pelvic and perineal pain: Secondary | ICD-10-CM

## 2019-04-21 NOTE — Progress Notes (Signed)
    [name] 1951/06/05 [id number]        68 y.o.  G0P0000 presents for ultrasound.  At recent annual exam she was complaining of pelvic discomfort that comes and goes of the last several months.  No localizing symptoms.  Exam was normal.  Past medical history,surgical history, problem list, medications, allergies, family history and social history were all reviewed and documented in the EPIC chart.  Directed ROS with pertinent positives and negatives documented in the history of present illness/assessment and plan.  Exam: Vitals:   04/21/19 0836  BP: 122/76   General appearance:  Normal  Ultrasound transvaginal shows uterus normal size with small myomas largest measuring 1.42 cm.  #6 in total measured.  Endometrial echo with small amount of fluid.  Endometrial walls measure thin at 1.1 mm.  Right and left ovaries visualized and atrophic in appearance.  No adnexal masses.  No free fluid.  Cul-de-sac negative  Assessment/Plan:  68 y.o. G0P0000 with normal postmenopausal ultrasound other than multiple small myomas largest measuring 1.4 cm.  I reassured the patient that I do not feel that her discomfort is from a GYN etiology.  I recommended that she follow-up with a gastroenterologist if her discomfort continues.  Otherwise she will follow-up in 1 year for annual exam when due.    Anastasio Auerbach MD, 8:47 AM 04/21/2019

## 2019-04-21 NOTE — Patient Instructions (Signed)
Follow-up next year when you are due for your annual exam, sooner as needed.  If your abdominal discomfort continues I would recommend following up with a gastroenterologist.

## 2019-05-03 ENCOUNTER — Ambulatory Visit: Payer: Medicare Other | Admitting: Internal Medicine

## 2019-05-03 ENCOUNTER — Other Ambulatory Visit: Payer: Self-pay

## 2019-05-03 ENCOUNTER — Ambulatory Visit (INDEPENDENT_AMBULATORY_CARE_PROVIDER_SITE_OTHER): Payer: Medicare Other

## 2019-05-03 ENCOUNTER — Encounter: Payer: Self-pay | Admitting: Internal Medicine

## 2019-05-03 DIAGNOSIS — R0600 Dyspnea, unspecified: Secondary | ICD-10-CM

## 2019-05-03 DIAGNOSIS — R05 Cough: Secondary | ICD-10-CM | POA: Diagnosis not present

## 2019-05-03 DIAGNOSIS — R058 Other specified cough: Secondary | ICD-10-CM

## 2019-05-03 DIAGNOSIS — R0609 Other forms of dyspnea: Secondary | ICD-10-CM | POA: Insufficient documentation

## 2019-05-03 LAB — BRAIN NATRIURETIC PEPTIDE: Pro B Natriuretic peptide (BNP): 49 pg/mL (ref 0.0–100.0)

## 2019-05-03 NOTE — Patient Instructions (Addendum)
Continue omeprazole 40 mg Take 30-60 min before first meal of the day   GERD (REFLUX)  is an extremely common cause of respiratory symptoms just like yours , many times with no obvious heartburn at all.    It can be treated with medication, but also with lifestyle changes including elevation of the head of your bed (ideally with 6 -8inch blocks under the headboard of your bed),  Smoking cessation, avoidance of late meals, excessive alcohol, and avoid fatty foods, chocolate, peppermint, colas, red wine, and acidic juices such as orange juice.  NO MINT OR MENTHOL PRODUCTS SO NO COUGH DROPS  USE SUGARLESS CANDY INSTEAD (Jolley ranchers or Stover's or Life Savers) or even ice chips will also do - the key is to swallow to prevent all throat clearing. NO OIL BASED VITAMINS - use powdered substitutes.  Avoid fish oil when coughing.   To get the most out of exercise, you need to be continuously aware that you are short of breath, but never out of breath, for 30 minutes daily. As you improve, it will actually be easier for you to do the same amount of exercise  in  30 minutes so always push to the level where you are short of breath.         Please remember to go to the lab and x-ray department   for your tests - we will call you with the results when they are available and decide on the next step

## 2019-05-03 NOTE — Progress Notes (Signed)
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      History of Present Illness  05/03/2019  Pulmonary/ 1st office eval/[facility]  Chief Complaint  Patient presents with  . Pulmonary Consult    Self referral. Pt c/o cough for the past year. She has been having SOB for the past 6 wks.   Dyspnea:  Mailbox and back to house doe  flat grade 100 ft in [place] / was walking neighborhood x sev year twice to three times  weekly including hills x 40-45 min / no rest or noct sob  Cough: no real change daytime > noct non productive  Sleep: ok flat  SABA use: none   No obvious day to day or daytime variability or assoc excess/ purulent sputum or mucus plugs or hemoptysis or cp or chest tightness, subjective wheeze or overt sinus or hb symptoms.   Sleeping a above  without nocturnal  or early am exacerbation  of respiratory  c/o's or need for noct saba. Also denies any obvious fluctuation of symptoms with weather or environmental changes or other aggravating or alleviating factors except as outlined above   No unusual exposure hx or h/o childhood pna/ asthma or knowledge of premature birth.  Current Allergies, Complete Past Medical History, Past Surgical History, Family History, and Social History were reviewed in [facility] record.  ROS  The following are not active complaints unless bolded Hoarseness, sore throat, dysphagia, dental problems, itching, sneezing,  nasal congestion or sensation of discharge of excess mucus or purulent secretions, ear ache,   fever, chills,  sweats, unintended wt loss or wt gain, classically pleuritic or exertional cp,  orthopnea pnd or arm/hand swelling  or leg swelling, presyncope, palpitations, abdominal pain, anorexia, nausea, vomiting, diarrhea  or change in bowel habits or change in bladder habits, change in stools or change in urine, dysuria, hematuria,  rash, arthralgias, visual complaints, headache, numbness, weakness or ataxia or problems with walking or coordination,  change in mood or  memory.           Past Medical History:  Diagnosis Date  . Arthritis    cervical and lumbar  . Depression   . GERD (gastroesophageal reflux disease)   . Hyperlipidemia   . IBS (irritable bowel syndrome)   . Prediabetes   . Vitamin D deficiency     Outpatient Medications Prior to Visit  Medication Sig Dispense Refill  . aspirin 81 MG tablet Take 81 mg by mouth daily.    . cetirizine (ZYRTEC) 10 MG tablet Take 10 mg by mouth daily as needed for allergies.    . Cholecalciferol (D-3-5) 5000 UNITS capsule Take 5,000 Units by mouth daily.    Marland Kitchen ezetimibe (ZETIA) 10 MG tablet Take 1 tablet Daily for Cholesterol 90 tablet 3  . famotidine (PEPCID) 20 MG tablet TAKE 1 TABLET(20 MG) BY MOUTH AT BEDTIME. MAY TAKE SECOND PILL DURING DAY IF SYMPTOMATIC 180 tablet 0  . Flaxseed, Linseed, (FLAXSEED OIL PO) Take by mouth daily.    . fluticasone (FLONASE)  50 MCG/ACT nasal spray Place 2 sprays into both nostrils daily. 16 g 0  . Lutein 20 MG TABS Take by mouth.    . Multiple Vitamins-Minerals (WOMENS MULTI VITAMIN & MINERAL PO) Take 1 tablet by mouth daily.    . Omega-3 Fatty Acids (FISH OIL PO) Take by mouth daily.    Marland Kitchen omeprazole (PRILOSEC) 40 MG capsule Take 40 mg by mouth daily.    Marland Kitchen OVER THE COUNTER MEDICATION Patient takes MOM as needed.    Marland Kitchen OVER THE COUNTER MEDICATION     . RESTASIS 0.05 % ophthalmic emulsion instill 1 drop into both eyes twice a day  0  . gabapentin (NEURONTIN) 100 MG capsule Take 1 capsule 3 x /day for burning pains in  thighs (Patient not taking: Reported on 04/13/2019) 90 capsule 2  . Probiotic Product (ALIGN) 4 MG CAPS Take by mouth daily.        Objective:     BP 106/62 (BP Location: Left Arm, Cuff Size: Normal)   Pulse 64   Temp (!) 97.1 F (36.2 C) (Temporal)   Ht 5\' 5"  (1.651 m)   Wt 145 lb (65.8 kg)   SpO2 100% Comment: on RA  BMI 24.13 kg/m   SpO2: 100 %(on RA)   Pleasant amb bf nad    HEENT : pt wearing mask not removed for exam due to covid -19 concerns.    NECK :  without JVD/Nodes/TM/ nl carotid upstrokes bilaterally   LUNGS: no acc muscle use,  Nl contour chest which is clear to A and P bilaterally without cough on insp or exp maneuvers   CV:  RRR  no s3 or murmur or increase in P2, and no edema   ABD:  soft and nontender with nl inspiratory excursion in the supine position. No bruits or organomegaly appreciated, bowel sounds nl  MS:  Nl gait/ ext warm without deformities, calf tenderness, cyanosis or clubbing No obvious joint restrictions   SKIN: warm and dry without lesions    NEURO:  alert, approp, nl sensorium with  no motor or cerebellar deficits apparent.    CXR PA and Lateral:   05/03/2019 :    I personally reviewed images and agree with radiology impression as follows:   No acute cardiopulmonary findings.  No change since prior studies.   Labs ordered/ reviewed:      Chemistry      Component Value Date/Time   NA 140 02/17/2019 0000   K 4.3 02/17/2019 0000   CL 105 02/17/2019 0000   CO2 28 02/17/2019 0000   BUN 11 02/17/2019 0000   CREATININE 0.76 02/17/2019 0000      Component Value Date/Time   CALCIUM 9.3 02/17/2019 0000   ALKPHOS 72 01/15/2017 0950   AST 19 02/17/2019 0000   ALT 12 02/17/2019 0000   BILITOT 0.5 02/17/2019 0000        Lab Results  Component Value Date   WBC 2.7 (L) 02/17/2019   HGB 12.2 02/17/2019   HCT 36.5 02/17/2019   MCV 85.9 02/17/2019   PLT 168 02/17/2019       EOS  78                                       02/17/2019  Lab Results  Component Value Date   DDIMER 0.21 05/03/2019      Lab Results  Component Value Date   TSH 1.37 02/17/2019     Lab Results  Component Value Date   PROBNP 49.0 05/03/2019      Labs ordered 05/03/2019  :  allergy profile              Assessment   DOE (dyspnea on exertion) Onset late Sept 2020 - 05/03/2019   Walked RA x one lap =  approx 250 ft @ moderate to fast pace - stopped due to end of study  with sats of 99%  at the end of the study and  min sob       Symptoms are markedly disproportionate to objective findings and not clear to what extent this is actually a pulmonary  problem but pt does appear to have difficult to sort out respiratory symptoms of unknown origin for which  DDX  = almost all start with A and  include Adherence, Ace Inhibitors, Acid Reflux, Active Sinus Disease, Alpha 1 Antitripsin deficiency, Anxiety masquerading as Airways dz,  ABPA,  Allergy(esp in young), Aspiration (esp in elderly), Adverse effects of meds,  Active smoking or Vaping, A bunch of PE's/clot burden (a few small clots can't cause this syndrome unless there is already severe underlying pulm or vascular dz with poor reserve),  Anemia or thyroid disorder, plus two Bs  = Bronchiectasis and Beta blocker use..and one C= CHF     Adherence is always the initial "prime suspect" and is a multilayered concern that requires a "trust but verify" approach in every patient - starting with knowing how to use medications, especially inhalers, correctly, keeping up with refills and understanding the fundamental difference between maintenance and prns vs those medications only taken for a very short course and then stopped and not refilled.   ? Acid (or non-acid) GERD > always difficult to exclude as up to 75% of pts in some series report no assoc GI/ Heartburn symptoms> rec continue max (24h)  acid suppression and diet restrictions/  reviewed     ? Allergy/asthma/ABPA > background of chronic rhinitis is risk factor but no noct symptoms or variability is against > could do Methacholine at some point to exclude rather than start empirical trials. For now just screen with IgE level   ? Anxiety/depression/ deconditioning  > usually at the bottom of this list of usual suspects but should be included   on this pt's based on H and P and note  may interfere with adherence and also interpretation of response or lack thereof to symptom management which can be quite subjective.   ? Active sinus dz > see uacs   ? Adverse effects of meds > none of the usual suspects listed   ? A Bunch of pe's > D dimer nl - while a normal  or high normal value (seen commonly in the elderly or chronically ill)  may miss small peripheral pe, the clot burden with sob is moderately high and the d dimer  has a very high neg pred value if used in this setting.    ? CHF > excluded by cxr/ bnp    rec reconditioning at submax levels of  ex and f/u in 3 months with CPST if needed to sort out.       Upper airway cough syndrome Onset 2016 assoc with rhinitis no better with zyrtec  - 05/03/2019 Eos nl, rec max gerd rx/ diet and f/u allergy if IgE up   Upper airway cough syndrome (previously labeled PNDS),  is so named because it's frequently impossible to sort out how much is  CR/sinusitis with freq throat clearing (which can be related to primary GERD)   vs  causing  secondary (" extra esophageal")  GERD from wide swings in gastric pressure that occur with throat clearing, often  promoting self use of mint and menthol lozenges that reduce the lower esophageal sphincter tone and exacerbate the problem further in a cyclical fashion.   These are the same pts (now being labeled as having "irritable larynx syndrome" by some cough centers) who not infrequently have a history of having failed to tolerate ace inhibitors,  dry powder inhalers or biphosphonates or report  having atypical/extraesophageal reflux symptoms that don't respond to standard doses of PPI  and are easily confused as having aecopd or asthma flares by even experienced allergists/ pulmonologists (myself included).    No evidence of asthma here and not on acei, biphosphonates or DPI's so next step is allergy eval prn     Total time devoted to counseling  > 50 % of initial 60 min office visit:  reviewed case with pt/  directly observed portions of ambulatory 02 saturation study/ discussion of options/alternatives/ personally creating written customized instructions  in presence of pt  then going over those specific  Instructions directly with the pt including how to use all of the meds but in particular covering each new medication in detail and the difference between the maintenance= "automatic" meds and the prns using an action plan format for the latter (If this problem/symptom => do that organization reading Left to right).  Please see AVS from this visit for a full list of these instructions which I personally wrote for this pt and  are unique to this visit.      Christinia Gully, MD 05/03/2019

## 2019-05-04 ENCOUNTER — Encounter: Payer: Self-pay | Admitting: Internal Medicine

## 2019-05-04 DIAGNOSIS — R058 Other specified cough: Secondary | ICD-10-CM | POA: Insufficient documentation

## 2019-05-04 DIAGNOSIS — R05 Cough: Secondary | ICD-10-CM | POA: Insufficient documentation

## 2019-05-04 LAB — IGE: IgE (Immunoglobulin E), Serum: 558 kU/L — ABNORMAL HIGH (ref <=114)

## 2019-05-04 LAB — D-DIMER, QUANTITATIVE: D-Dimer, Quant: 0.21 mcg/mL FEU (ref <0.50)

## 2019-05-04 NOTE — Assessment & Plan Note (Signed)
Onset late Sept 2020 - 05/03/2019   Walked RA x one lap =  approx 250 ft @ moderate to fast pace - stopped due to end of study  with sats of 99%  at the end of the study and  min sob       Symptoms are markedly disproportionate to objective findings and not clear to what extent this is actually a pulmonary  problem but pt does appear to have difficult to sort out respiratory symptoms of unknown origin for which  DDX  = almost all start with A and  include Adherence, Ace Inhibitors, Acid Reflux, Active Sinus Disease, Alpha 1 Antitripsin deficiency, Anxiety masquerading as Airways dz,  ABPA,  Allergy(esp in young), Aspiration (esp in elderly), Adverse effects of meds,  Active smoking or Vaping, A bunch of PE's/clot burden (a few small clots can't cause this syndrome unless there is already severe underlying pulm or vascular dz with poor reserve),  Anemia or thyroid disorder, plus two Bs  = Bronchiectasis and Beta blocker use..and one C= CHF     Adherence is always the initial "prime suspect" and is a multilayered concern that requires a "trust but verify" approach in every patient - starting with knowing how to use medications, especially inhalers, correctly, keeping up with refills and understanding the fundamental difference between maintenance and prns vs those medications only taken for a very short course and then stopped and not refilled.   ? Acid (or non-acid) GERD > always difficult to exclude as up to 75% of pts in some series report no assoc GI/ Heartburn symptoms> rec continue max (24h)  acid suppression and diet restrictions/ reviewed     ? Allergy/asthma/ABPA > background of chronic rhinitis is risk factor but no noct symptoms or variability is against > could do Methacholine at some point to exclude rather than start empirical trials. For now just screen with IgE level   ? Anxiety/depression/ deconditioning  > usually at the bottom of this list of usual suspects but should be included   on  this pt's based on H and P and note  may interfere with adherence and also interpretation of response or lack thereof to symptom management which can be quite subjective.   ? Active sinus dz > see uacs   ? Adverse effects of meds > none of the usual suspects listed   ? A Bunch of pe's > D dimer nl - while a normal  or high normal value (seen commonly in the elderly or chronically ill)  may miss small peripheral pe, the clot burden with sob is moderately high and the d dimer  has a very high neg pred value if used in this setting.    ? CHF > excluded by cxr/ bnp    rec reconditioning at submax levels of ex and f/u in 3 months with CPST if needed to sort out.

## 2019-05-04 NOTE — Assessment & Plan Note (Signed)
Onset 2016 assoc with rhinitis no better with zyrtec  - 05/03/2019 Eos nl, rec max gerd rx/ diet and f/u allergy if IgE up   Upper airway cough syndrome (previously labeled PNDS),  is so named because it's frequently impossible to sort out how much is  CR/sinusitis with freq throat clearing (which can be related to primary GERD)   vs  causing  secondary (" extra esophageal")  GERD from wide swings in gastric pressure that occur with throat clearing, often  promoting self use of mint and menthol lozenges that reduce the lower esophageal sphincter tone and exacerbate the problem further in a cyclical fashion.   These are the same pts (now being labeled as having "irritable larynx syndrome" by some cough centers) who not infrequently have a history of having failed to tolerate ace inhibitors,  dry powder inhalers or biphosphonates or report having atypical/extraesophageal reflux symptoms that don't respond to standard doses of PPI  and are easily confused as having aecopd or asthma flares by even experienced allergists/ pulmonologists (myself included).    No evidence of asthma here and not on acei, biphosphonates or DPI's so next step is allergy eval prn     Total time devoted to counseling  > 50 % of initial 60 min office visit:  reviewed case with pt/  directly observed portions of ambulatory 02 saturation study/ discussion of options/alternatives/ personally creating written customized instructions  in presence of pt  then going over those specific  Instructions directly with the pt including how to use all of the meds but in particular covering each new medication in detail and the difference between the maintenance= "automatic" meds and the prns using an action plan format for the latter (If this problem/symptom => do that organization reading Left to right).  Please see AVS from this visit for a full list of these instructions which I personally wrote for this pt and  are unique to this visit.

## 2019-05-05 ENCOUNTER — Other Ambulatory Visit: Payer: Self-pay | Admitting: Internal Medicine

## 2019-05-05 DIAGNOSIS — R058 Other specified cough: Secondary | ICD-10-CM

## 2019-05-05 DIAGNOSIS — R05 Cough: Secondary | ICD-10-CM

## 2019-05-05 DIAGNOSIS — R768 Other specified abnormal immunological findings in serum: Secondary | ICD-10-CM

## 2019-05-05 NOTE — Progress Notes (Signed)
Spoke with pt and notified of results per Dr. Wert. Pt verbalized understanding and denied any questions. 

## 2019-05-16 ENCOUNTER — Ambulatory Visit: Payer: Medicare Other | Admitting: Adult Health

## 2019-05-25 NOTE — Progress Notes (Signed)
FOLLOW UP  Assessment and Plan:   Hypertension Well controlled off of medications Monitor blood pressure at home; patient to call if consistently greater than 130/80 Continue DASH diet.   Reminder to go to the ER if any CP, SOB, nausea, dizziness, severe HA, changes vision/speech, left arm numbness and tingling and jaw pain.  Cholesterol Currently mildly above goal; continue zetia, omega 3  LDL goal <100 Continue low cholesterol diet and exercise.  Check lipid panel.   Prediabetes Continue diet and exercise.  Perform daily foot/skin check, notify office of any concerning changes.  Check A1C q26m; check serum glucose, monitor weight trends  BMi 24 Continue to recommend diet heavy in fruits and veggies and low in animal meats, cheeses, and dairy products, appropriate calorie intake Discuss exercise recommendations routinely, decrease splurge snacking (cheetos, cheeze-its) Continue to monitor weight at each visit  Vitamin D Def At goal at last visit; continue supplementation to maintain goal of 70-100 Defer Vit D level  GERD  Continue PPI per Dr. Melvyn Novas Discussed diet, avoiding triggers and other lifestyle changes  Dyspnea on exertion Unremarkable cardiology and pulmonology evaluation; not with exertion, no accompaniments Reports occasional chest tightness only when in kitchen Pending evaluation by allergist  Upper airway cough syndrome  Improved with PPI; pending evaluation by allergist  Bilateral shoulder pain, intermittent Bilateral shoulder- normal, non-specific exam, no stiffness, pain not elicited today Natural history and expected course discussed. Questions answered. Neurosurgeon distributed. Reduction in offending activity. Gentle ROM exercises. Rest, ice, compression, and elevation (RICE) therapy. NSAIDs per medication orders.  Follow up if not resolving    Continue diet and meds as discussed. Further disposition pending results of labs. Discussed  med's effects and SE's.   Over 30 minutes of exam, counseling, chart review, and critical decision making was performed.   Future Appointments  Date Time Provider [place]  06/07/2019  2:00 PM Kozlow, Donnamarie Poag, MD [facility] None  08/29/2019  9:30 AM Unk Pinto, MD [facility] None  12/09/2019  9:30 AM Vicie Mutters, PA-C [facility] None  03/20/2020  9:00 AM Unk Pinto, MD [facility] None    ----------------------------------------------------------------------------------------------------------------------  HPI 68 y.o. female  presents for 3 month follow up on hypertension, cholesterol, prediabetes, weight and vitamin D deficiency.   Today she reports 6+ months of bilateral shoulder pain; she is right handed; she reports started with R after she reached backwards in her car and strained it; no traumatic events; she reports since that time she has intermittent shoulder pain, achy, worst when sleeping on her side/shoulder (is a side sleeper). She reports left shoulder also started bothering her a few months later. She reports left shoulder has radiating/shooting/tingling sensation down her arm with intermittent sharp pain intermittently which she can't correlate with particular movements or activities. She does report some mild intermittent neck pain but this has resolved spontaneously. She has not tried any interventions. She reports some disfomfort when using her left arm to curl her hair.   She has been referred to cardiology and pulmonology due to reports of exertional dyspnea with unremarkable workup. She has been referred by Dr. Melvyn Novas to Dr. Neldon Mc citing upper airway cough syndrome and recommended continue PPI.   she has a diagnosis of GERD which is currently managed by omeprazole 40 mg she reports symptoms is currently well controlled, and denies breakthrough reflux, burning in chest, hoarseness.    She does have constipation, but improved with align, taking citrucel,  miralax, working on increasing water intake. Worse with  butter or fried foods.   BMI is Body mass index is 24.13 kg/m., she has been working on diet and exercise, is walking 3 days a week for 30 min at a time. Trying to increase.  Wt Readings from Last 3 Encounters:  05/26/19 145 lb (65.8 kg)  05/03/19 145 lb (65.8 kg)  04/13/19 146 lb (66.2 kg)   EKGs have demonstrated frequent PVCs/bigeminy Saw Dr. [place] and had normal ECHO in 03/2018-  Normal LV function; EF 55 to 60%. Normal diastolic parameters. Mild MR and TR Had normal cath in 2011 Her blood pressure has been controlled at home, today their BP is BP: 106/62  She does workout. She denies chest pain, dizziness. She reports in the past week she has not had shortness of breath; she denies notable dyspnea with exertion outdoors, has had some episodes where she feels she is work to breathe when working in [employer].    She is on cholesterol medication Zetia and omega 3 and denies myalgias. Her cholesterol is not at goal. The cholesterol last visit was:   Lab Results  Component Value Date   CHOL 192 02/17/2019   HDL 73 02/17/2019   LDLCALC 105 (H) 02/17/2019   TRIG 54 02/17/2019   CHOLHDL 2.6 02/17/2019    She has been working on diet and exercise for prediabetes, and denies increased appetite, nausea, paresthesia of the feet, polydipsia, polyuria and visual disturbances. Last A1C in the office was:  Lab Results  Component Value Date   HGBA1C 5.7 (H) 02/17/2019   Patient is on Vitamin D supplement, taking 10000 MWF, 5000 IU other days.   Lab Results  Component Value Date   VD25OH 59 02/17/2019        Current Medications:  Current Outpatient Medications on File Prior to Visit  Medication Sig  . aspirin 81 MG tablet Take 81 mg by mouth daily.  . cetirizine (ZYRTEC) 10 MG tablet Take 10 mg by mouth daily as needed for allergies.  . Cholecalciferol (D-3-5) 5000 UNITS capsule Take 5,000 Units by mouth daily.  Marland Kitchen ezetimibe  (ZETIA) 10 MG tablet Take 1 tablet Daily for Cholesterol  . famotidine (PEPCID) 20 MG tablet TAKE 1 TABLET(20 MG) BY MOUTH AT BEDTIME. MAY TAKE SECOND PILL DURING DAY IF SYMPTOMATIC  . Flaxseed, Linseed, (FLAXSEED OIL PO) Take by mouth daily.  . fluticasone (FLONASE) 50 MCG/ACT nasal spray Place 2 sprays into both nostrils daily.  . Lutein 20 MG TABS Take by mouth.  . Multiple Vitamins-Minerals (WOMENS MULTI VITAMIN & MINERAL PO) Take 1 tablet by mouth daily.  . Omega-3 Fatty Acids (FISH OIL PO) Take by mouth daily.  Marland Kitchen omeprazole (PRILOSEC) 40 MG capsule Take 40 mg by mouth daily.  Marland Kitchen OVER THE COUNTER MEDICATION Patient takes MOM as needed.  Marland Kitchen OVER THE COUNTER MEDICATION   . RESTASIS 0.05 % ophthalmic emulsion instill 1 drop into both eyes twice a day   No current facility-administered medications on file prior to visit.     Allergies:  Allergies  Allergen Reactions  . Bactrim [Sulfamethoxazole-Trimethoprim] Other (See Comments)    Patient preference to take medication without sulfa     Medical History:  Past Medical History:  Diagnosis Date  . Arthritis    cervical and lumbar  . Depression   . GERD (gastroesophageal reflux disease)   . Hyperlipidemia   . IBS (irritable bowel syndrome)   . Prediabetes   . Vitamin D deficiency    Family history- Reviewed and  unchanged Social history- Reviewed and unchanged   Review of Systems:  Review of Systems  Constitutional: Negative for malaise/fatigue and weight loss.  HENT: Negative for hearing loss and tinnitus.   Eyes: Negative for blurred vision and double vision.  Respiratory: Negative for cough (occasional, improved with PPI), shortness of breath and wheezing.   Cardiovascular: Negative for chest pain, palpitations, orthopnea, claudication and leg swelling.  Gastrointestinal: Positive for constipation. Negative for abdominal pain, blood in stool, diarrhea, heartburn, melena, nausea and vomiting.  Genitourinary: Negative.    Musculoskeletal: Positive for joint pain (bil shoulders intermittently). Negative for myalgias.  Skin: Negative for rash.  Neurological: Negative for dizziness, tingling, sensory change, weakness and headaches.  Endo/Heme/Allergies: Negative for polydipsia.  Psychiatric/Behavioral: Negative.   All other systems reviewed and are negative.   Physical Exam: BP 106/62   Pulse 61   Temp (!) 97.5 F (36.4 C)   Ht 5\' 5"  (1.651 m)   Wt 145 lb (65.8 kg)   SpO2 99%   BMI 24.13 kg/m  Wt Readings from Last 3 Encounters:  05/26/19 145 lb (65.8 kg)  05/03/19 145 lb (65.8 kg)  04/13/19 146 lb (66.2 kg)   General Appearance: Well nourished, in no apparent distress. Eyes: PERRLA, EOMs, conjunctiva no swelling or erythema Sinuses: No Frontal/maxillary tenderness ENT/Mouth: Ext aud canals clear, TMs without erythema, bulging. No erythema, swelling, or exudate on post pharynx.  Tonsils not swollen or erythematous. Hearing normal.  Neck: Supple, thyroid normal.  Respiratory: Respiratory effort normal, BS equal bilaterally without rales, rhonchi, wheezing or stridor.  Cardio: RRR with no MRGs. Brisk peripheral pulses without edema.  Abdomen: Soft, + BS.  Non tender, no guarding, rebound, hernias, masses. Lymphatics: Non tender without lymphadenopathy.  Musculoskeletal: Full ROM, 5/5 strength, Normal gait Bilateral normal active ROM, no tenderness, no impingement sign  Strength is normal and symmetric in arms. Sensation intact bilaterally. Radial pulses are symmetrical. No spinal tenderness, full cervical ROM intact.  Skin: Warm, dry without rashes, lesions, ecchymosis.  Neuro: Cranial nerves intact. No cerebellar symptoms.  Psych: Awake and oriented X 3, normal affect, Insight and Judgment appropriate.    Izora Ribas, NP 9:53 AM [facility]

## 2019-05-26 ENCOUNTER — Other Ambulatory Visit: Payer: Self-pay | Admitting: Adult Health

## 2019-05-26 ENCOUNTER — Encounter: Payer: Self-pay | Admitting: Adult Health

## 2019-05-26 ENCOUNTER — Other Ambulatory Visit: Payer: Self-pay

## 2019-05-26 ENCOUNTER — Ambulatory Visit: Payer: Medicare Other | Admitting: Adult Health

## 2019-05-26 VITALS — BP 106/62 | HR 61 | Temp 97.5°F | Ht 65.0 in | Wt 145.0 lb

## 2019-05-26 DIAGNOSIS — E785 Hyperlipidemia, unspecified: Secondary | ICD-10-CM | POA: Diagnosis not present

## 2019-05-26 DIAGNOSIS — E559 Vitamin D deficiency, unspecified: Secondary | ICD-10-CM | POA: Diagnosis not present

## 2019-05-26 DIAGNOSIS — R0989 Other specified symptoms and signs involving the circulatory and respiratory systems: Secondary | ICD-10-CM

## 2019-05-26 DIAGNOSIS — M25511 Pain in right shoulder: Secondary | ICD-10-CM

## 2019-05-26 DIAGNOSIS — R058 Other specified cough: Secondary | ICD-10-CM

## 2019-05-26 DIAGNOSIS — K219 Gastro-esophageal reflux disease without esophagitis: Secondary | ICD-10-CM

## 2019-05-26 DIAGNOSIS — M25512 Pain in left shoulder: Secondary | ICD-10-CM

## 2019-05-26 DIAGNOSIS — R7309 Other abnormal glucose: Secondary | ICD-10-CM

## 2019-05-26 DIAGNOSIS — R0600 Dyspnea, unspecified: Secondary | ICD-10-CM

## 2019-05-26 DIAGNOSIS — R0609 Other forms of dyspnea: Secondary | ICD-10-CM

## 2019-05-26 DIAGNOSIS — R05 Cough: Secondary | ICD-10-CM

## 2019-05-26 MED ORDER — MELOXICAM 15 MG PO TABS: ORAL_TABLET | ORAL | 1 refills | Status: DC

## 2019-05-26 NOTE — Patient Instructions (Addendum)
Goals    . Exercise 150 min/wk Moderate Activity    . HEMOGLOBIN A1C < 5.6    . LDL CALC < 130         Shoulder Pain Many things can cause shoulder pain, including:  An injury to the shoulder.  Overuse of the shoulder.  Arthritis. The source of the pain can be:  Inflammation.  An injury to the shoulder joint.  An injury to a tendon, ligament, or bone. Follow these instructions at home: Pay attention to changes in your symptoms. Let your health care provider know about them. Follow these instructions to relieve your pain. If you have a sling:  Wear the sling as told by your health care provider. Remove it only as told by your health care provider.  Loosen the sling if your fingers tingle, become numb, or turn cold and blue.  Keep the sling clean.  If the sling is not waterproof: ? Do not let it get wet. Remove it to shower or bathe.  Move your arm as little as possible, but keep your hand moving to prevent swelling. Managing pain, stiffness, and swelling   If directed, put ice on the painful area: ? Put ice in a plastic bag. ? Place a towel between your skin and the bag. ? Leave the ice on for 20 minutes, 2-3 times per day. Stop applying ice if it does not help with the pain.  Squeeze a soft ball or a foam pad as much as possible. This helps to keep the shoulder from swelling. It also helps to strengthen the arm. General instructions  Take over-the-counter and prescription medicines only as told by your health care provider.  Keep all follow-up visits as told by your health care provider. This is important. Contact a health care provider if:  Your pain gets worse.  Your pain is not relieved with medicines.  New pain develops in your arm, hand, or fingers. Get help right away if:  Your arm, hand, or fingers: ? Tingle. ? Become numb. ? Become swollen. ? Become painful. ? Turn white or blue. Summary  Shoulder pain can be caused by an injury, overuse,  or arthritis.  Pay attention to changes in your symptoms. Let your health care provider know about them.  This condition may be treated with a sling, ice, and pain medicines.  Contact your health care provider if the pain gets worse or new pain develops. Get help right away if your arm, hand, or fingers tingle or become numb, swollen, or painful.  Keep all follow-up visits as told by your health care provider. This is important. This information is not intended to replace advice given to you by your health care provider. Make sure you discuss any questions you have with your health care provider. Document Released: 03/12/2005 Document Revised: 12/15/2017 Document Reviewed: 12/15/2017 Elsevier Patient Education  [place].     Meloxicam tablets What is this medicine? MELOXICAM (mel OX i cam) is a non-steroidal anti-inflammatory drug (NSAID). It is used to reduce swelling and to treat pain. It may be used for osteoarthritis, rheumatoid arthritis, or juvenile rheumatoid arthritis. This medicine may be used for other purposes; ask your health care provider or pharmacist if you have questions. COMMON BRAND NAME(S): Mobic What should I tell my health care provider before I take this medicine? They need to know if you have any of these conditions:  bleeding disorders  cigarette smoker  coronary artery bypass graft (CABG) surgery within the  past 2 weeks  drink more than 3 alcohol-containing drinks per day  heart disease  high blood pressure  history of stomach bleeding  kidney disease  liver disease  lung or breathing disease, like asthma  stomach or intestine problems  an unusual or allergic reaction to meloxicam, aspirin, other NSAIDs, other medicines, foods, dyes, or preservatives  pregnant or trying to get pregnant  breast-feeding How should I use this medicine? Take this medicine by mouth with a full glass of water. Follow the directions on the prescription  label. You can take it with or without food. If it upsets your stomach, take it with food. Take your medicine at regular intervals. Do not take it more often than directed. Do not stop taking except on your doctor's advice. A special MedGuide will be given to you by the pharmacist with each prescription and refill. Be sure to read this information carefully each time. Talk to your pediatrician regarding the use of this medicine in children. While this drug may be prescribed for selected conditions, precautions do apply. Patients over 14 years old may have a stronger reaction and need a smaller dose. Overdosage: If you think you have taken too much of this medicine contact a poison control center or emergency room at once. NOTE: This medicine is only for you. Do not share this medicine with others. What if I miss a dose? If you miss a dose, take it as soon as you can. If it is almost time for your next dose, take only that dose. Do not take double or extra doses. What may interact with this medicine? Do not take this medicine with any of the following medications:  cidofovir  ketorolac This medicine may also interact with the following medications:  aspirin and aspirin-like medicines  certain medicines for blood pressure, heart disease, irregular heart beat  certain medicines for depression, anxiety, or psychotic disturbances  certain medicines that treat or prevent blood clots like warfarin, enoxaparin, dalteparin, apixaban, dabigatran, rivaroxaban  cyclosporine  diuretics  fluconazole  lithium  methotrexate  other NSAIDs, medicines for pain and inflammation, like ibuprofen and naproxen  pemetrexed This list may not describe all possible interactions. Give your health care provider a list of all the medicines, herbs, non-prescription drugs, or dietary supplements you use. Also tell them if you smoke, drink alcohol, or use illegal drugs. Some items may interact with your  medicine. What should I watch for while using this medicine? Tell your doctor or healthcare provider if your symptoms do not start to get better or if they get worse. This medicine may cause serious skin reactions. They can happen weeks to months after starting the medicine. Contact your healthcare provider right away if you notice fevers or flu-like symptoms with a rash. The rash may be red or purple and then turn into blisters or peeling of the skin. Or, you might notice a red rash with swelling of the face, lips or lymph nodes in your neck or under your arms. Do not take other medicines that contain aspirin, ibuprofen, or naproxen with this medicine. Side effects such as stomach upset, nausea, or ulcers may be more likely to occur. Many medicines available without a prescription should not be taken with this medicine. This medicine can cause ulcers and bleeding in the stomach and intestines at any time during treatment. This can happen with no warning and may cause death. There is increased risk with taking this medicine for a long time. Smoking, drinking alcohol, older  age, and poor health can also increase risks. Call your doctor right away if you have stomach pain or blood in your vomit or stool. This medicine does not prevent heart attack or stroke. In fact, this medicine may increase the chance of a heart attack or stroke. The chance may increase with longer use of this medicine and in people who have heart disease. If you take aspirin to prevent heart attack or stroke, talk with your doctor or healthcare provider. What side effects may I notice from receiving this medicine? Side effects that you should report to your doctor or health care professional as soon as possible:  allergic reactions like skin rash, itching or hives, swelling of the face, lips, or tongue  nausea, vomiting  redness, blistering, peeling, or loosening of the skin, including inside the mouth  signs and symptoms of a  blood clot such as breathing problems; changes in vision; chest pain; severe, sudden headache; pain, swelling, warmth in the leg; trouble speaking; sudden numbness or weakness of the face, arm, or leg  signs and symptoms of bleeding such as bloody or black, tarry stools; red or dark-brown urine; spitting up blood or brown material that looks like coffee grounds; red spots on the skin; unusual bruising or bleeding from the eye, gums, or nose  signs and symptoms of liver injury like dark yellow or brown urine; general ill feeling or flu-like symptoms; light-colored stools; loss of appetite; nausea; right upper belly pain; unusually weak or tired; yellowing of the eyes or skin  signs and symptoms of stroke like changes in vision; confusion; trouble speaking or understanding; severe headaches; sudden numbness or weakness of the face, arm, or leg; trouble walking; dizziness; loss of balance or coordination Side effects that usually do not require medical attention (report to your doctor or health care professional if they continue or are bothersome):  constipation  diarrhea  gas This list may not describe all possible side effects. Call your doctor for medical advice about side effects. You may report side effects to FDA at 1-800-FDA-1088. Where should I keep my medicine? Keep out of the reach of children. Store at room temperature between 15 and 30 degrees C (59 and 86 degrees F). Throw away any unused medicine after the expiration date. NOTE: This sheet is a summary. It may not cover all possible information. If you have questions about this medicine, talk to your doctor, pharmacist, or health care provider.  2020 Elsevier/Gold Standard (2018-09-01 11:21:28)

## 2019-05-30 LAB — COMPLETE METABOLIC PANEL WITH GFR
AG Ratio: 1.4 (calc) (ref 1.0–2.5)
ALT: 13 U/L (ref 6–29)
AST: 18 U/L (ref 10–35)
Albumin: 4 g/dL (ref 3.6–5.1)
Alkaline phosphatase (APISO): 86 U/L (ref 37–153)
BUN: 13 mg/dL (ref 7–25)
CO2: 28 mmol/L (ref 20–32)
Calcium: 9.7 mg/dL (ref 8.6–10.4)
Chloride: 104 mmol/L (ref 98–110)
Creat: 0.87 mg/dL (ref 0.50–0.99)
GFR, Est African American: 80 mL/min/{1.73_m2} (ref >=60)
GFR, Est Non African American: 69 mL/min/{1.73_m2} (ref >=60)
Globulin: 2.9 g/dL (calc) (ref 1.9–3.7)
Glucose, Bld: 95 mg/dL (ref 65–99)
Potassium: 4.6 mmol/L (ref 3.5–5.3)
Sodium: 138 mmol/L (ref 135–146)
Total Bilirubin: 0.4 mg/dL (ref 0.2–1.2)
Total Protein: 6.9 g/dL (ref 6.1–8.1)

## 2019-05-30 LAB — CBC WITH DIFFERENTIAL/PLATELET
Absolute Monocytes: 242 cells/uL (ref 200–950)
Basophils Absolute: 19 cells/uL (ref 0–200)
Basophils Relative: 0.8 %
Eosinophils Absolute: 72 cells/uL (ref 15–500)
Eosinophils Relative: 3 %
HCT: 40.1 % (ref 35.0–45.0)
Hemoglobin: 13.2 g/dL (ref 11.7–15.5)
Lymphs Abs: 1205 cells/uL (ref 850–3900)
MCH: 28.4 pg (ref 27.0–33.0)
MCHC: 32.9 g/dL (ref 32.0–36.0)
MCV: 86.2 fL (ref 80.0–100.0)
MPV: 11.2 fL (ref 7.5–12.5)
Monocytes Relative: 10.1 %
Neutro Abs: 862 cells/uL — ABNORMAL LOW (ref 1500–7800)
Neutrophils Relative %: 35.9 %
Platelets: 179 10*3/uL (ref 140–400)
RBC: 4.65 10*6/uL (ref 3.80–5.10)
RDW: 12.5 % (ref 11.0–15.0)
Total Lymphocyte: 50.2 %
WBC: 2.4 10*3/uL — ABNORMAL LOW (ref 3.8–10.8)

## 2019-05-30 LAB — LIPID PANEL
Cholesterol: 199 mg/dL (ref <200)
HDL: 72 mg/dL (ref >=50)
LDL Cholesterol (Calc): 113 mg/dL (calc) — ABNORMAL HIGH
Non-HDL Cholesterol (Calc): 127 mg/dL (calc) (ref <130)
Total CHOL/HDL Ratio: 2.8 (calc) (ref <5.0)
Triglycerides: 57 mg/dL (ref <150)

## 2019-05-30 LAB — TEST AUTHORIZATION

## 2019-05-30 LAB — MAGNESIUM: Magnesium: 2.1 mg/dL (ref 1.5–2.5)

## 2019-05-30 LAB — PATHOLOGIST SMEAR REVIEW

## 2019-05-30 LAB — TSH: TSH: 1.22 mIU/L (ref 0.40–4.50)

## 2019-06-07 ENCOUNTER — Ambulatory Visit (INDEPENDENT_AMBULATORY_CARE_PROVIDER_SITE_OTHER): Payer: Medicare Other | Admitting: Allergy and Immunology

## 2019-06-07 ENCOUNTER — Other Ambulatory Visit: Payer: Self-pay

## 2019-06-07 ENCOUNTER — Encounter: Payer: Self-pay | Admitting: Allergy and Immunology

## 2019-06-07 VITALS — BP 98/62 | HR 66 | Temp 97.6°F

## 2019-06-07 DIAGNOSIS — K219 Gastro-esophageal reflux disease without esophagitis: Secondary | ICD-10-CM | POA: Diagnosis not present

## 2019-06-07 DIAGNOSIS — J3089 Other allergic rhinitis: Secondary | ICD-10-CM | POA: Diagnosis not present

## 2019-06-07 MED ORDER — MONTELUKAST SODIUM 10 MG PO TABS: 10.0000 mg | ORAL_TABLET | Freq: Every day | ORAL | 5 refills | Status: DC

## 2019-06-07 MED ORDER — OMEPRAZOLE 40 MG PO CPDR: DELAYED_RELEASE_CAPSULE | ORAL | 5 refills | Status: DC

## 2019-06-07 MED ORDER — FAMOTIDINE 40 MG PO TABS: ORAL_TABLET | ORAL | 5 refills | Status: DC

## 2019-06-07 NOTE — Progress Notes (Signed)
- [place] [facility] [place] - [place] - [place]   Dear Dr. Melford Aase,  Thank you for referring Jorge Lumia Dearden to the [facility] on 06/07/2019.   Below is a summation of this patient's evaluation and recommendations.  Thank you for your referral. I will keep you informed about this patient's response to treatment.   If you have any questions please do not hesitate to contact me.   Sincerely,  Jiles Prows, MD Allergy / Immunology [facility]   ______________________________________________________________________    NEW PATIENT NOTE  Referring Provider: Unk Pinto, MD Primary Provider: Unk Pinto, MD Date of office visit: 06/07/2019    Subjective:   Chief Complaint:  [name] (DOB: [birth year]) is a 68 y.o. female who presents to the clinic on 06/07/2019 with a chief complaint of Cough .     HPI: Chiann presents to this clinic in evaluation of cough.  Gervaise has had a cough for approximately 1 year.  Her cough is sometimes dry and sometimes she has a slight bit of phlegm and sometimes there is a barky quality and an intermittent tickle in her throat.  She has lots of throat clearing and lots of postnasal drip and intermittent raspy voice.  She will go through spells of cough that are difficult to control and sometimes this cough does awaken her at nighttime.  She has seen Dr. Melvyn Novas, pulmonology, and Dr. Lucia Gaskins, ENT who could not find any obvious provoking factor other than upper airway irritation giving rise to her cough.  It does not sound as though she has had a rhinoscopy performed but apparently she did have a mirror exam of her laryngeal structures.  She has a little bit of clear runny nose but she does not have any nasal congestion or sneezing or anosmia or other issues to suggest sinusitis.  She does have reflux with burning in her chest and occasionally  a sore epigastric area for which she has used various types of therapies including Zantac, Pepcid, and currently omeprazole.  She does not consume caffeine or consume chocolate nor does she consume alcohol.  She does have the head of the bed elevated by about 2 inches.  Her last meal of  the day is about 6 or 7 PM and she goes to sleep around midnight.  She has seen a gastroenterologist within the past year.  Apparently she has had some issues with intermittent shortness of breath that are very sporadic and spontaneous and are not associated with coughing or wheezing.  She does not have any exercise-induced bronchospastic symptoms or cold air induced bronchospastic symptoms.  She has been given Flonase and Flovent which she used for about 2 months and did not notice any change in her symptoms.  She has recently discontinued the use of one of her omega-3 oils and this has actually helped her cough somewhat.  She still continues to use a another form of omega-3 oil.  She did obtain the flu vaccine this year.  Past Medical History:  Diagnosis Date  . Arthritis    cervical and lumbar  . Depression   . GERD (gastroesophageal reflux disease)   . Hyperlipidemia   . IBS (irritable bowel syndrome)   . Prediabetes   . Vitamin D deficiency     Past Surgical History:  Procedure Laterality Date  . CARDIAC CATHETERIZATION  2011   normal (Dr. Terrence Dupont)  . RADIOACTIVE  SEED GUIDED EXCISIONAL BREAST BIOPSY Right 03/11/2016   Procedure: RIGHT RADIOACTIVE SEED GUIDED EXCISIONAL BREAST BIOPSY;  Surgeon: Rolm Bookbinder, MD;  Location: [facility];  Service: General;  Laterality: Right;    Allergies as of 06/07/2019      Reactions   Bactrim [sulfamethoxazole-trimethoprim] Other (See Comments)   Patient preference to take medication without sulfa      Medication List    aspirin 81 MG tablet Take 81 mg by mouth daily.   cetirizine 10 MG tablet Commonly known as: ZYRTEC Take 10 mg by  mouth daily as needed for allergies.   D-3-5 125 MCG (5000 UT) capsule Generic drug: Cholecalciferol Take 5,000 Units by mouth daily.   ezetimibe 10 MG tablet Commonly known as: ZETIA Take 1 tablet Daily for Cholesterol   FISH OIL PO Take by mouth daily.   FLAXSEED OIL PO Take by mouth daily.   Lutein 20 MG Tabs Take by mouth.   meloxicam 15 MG tablet Commonly known as: MOBIC Take 1 tablet Daily with Food for Pain & Inflammation   omeprazole 40 MG capsule Commonly known as: PRILOSEC Take 40 mg by mouth daily.   OVER THE COUNTER MEDICATION Patient takes MOM as needed.   OVER THE COUNTER MEDICATION   Restasis 0.05 % ophthalmic emulsion Generic drug: cycloSPORINE instill 1 drop into both eyes twice a day   WOMENS MULTI VITAMIN & MINERAL PO Take 1 tablet by mouth daily.       Review of systems negative except as noted in HPI / PMHx or noted below:  Review of Systems  Constitutional: Negative.   HENT: Negative.   Eyes: Negative.   Respiratory: Negative.   Cardiovascular: Negative.   Gastrointestinal: Negative.   Genitourinary: Negative.   Musculoskeletal: Negative.   Skin: Negative.   Neurological: Negative.   Endo/Heme/Allergies: Negative.   Psychiatric/Behavioral: Negative.     Family History  Problem Relation Age of Onset  . Hypertension Mother   . Diabetes Father   . Heart failure Father   . Cancer Maternal Grandmother        Lung  . Allergic rhinitis Neg Hx   . Angioedema Neg Hx   . Asthma Neg Hx   . Atopy Neg Hx   . Eczema Neg Hx   . Immunodeficiency Neg Hx   . Urticaria Neg Hx     Social History   Socioeconomic History  . Marital status: Married    Spouse name: Not on file  . Number of children: 0  . Years of education: Not on file  . Highest education level: Not on file  Occupational History  . Occupation: retired Copywriter, advertising  Tobacco Use  . Smoking status: Never Smoker  . Smokeless tobacco: Never Used  Substance  and Sexual Activity  . Alcohol use: Yes    Comment: Rare  . Drug use: No  . Sexual activity: Yes    Birth control/protection: Post-menopausal    Comment: 1st intercourse 21 yo-5 partners  Other Topics Concern  . Not on file  Social History Narrative  . Not on file   Social Determinants of Health   Financial Resource Strain:   . Difficulty of Paying Living Expenses: Not on file  Food Insecurity:   . Worried About Charity fundraiser in the Last Year: Not on file  . Ran Out of Food in the Last Year: Not on file  Transportation Needs:   . Lack of Transportation (Medical): Not on file  .  Lack of Transportation (Non-Medical): Not on file  Physical Activity:   . Days of Exercise per Week: Not on file  . Minutes of Exercise per Session: Not on file  Stress:   . Feeling of Stress : Not on file  Social Connections:   . Frequency of Communication with Friends and Family: Not on file  . Frequency of Social Gatherings with Friends and Family: Not on file  . Attends Religious Services: Not on file  . Active Member of Clubs or Organizations: Not on file  . Attends Archivist Meetings: Not on file  . Marital Status: Not on file  Intimate Partner Violence:   . Fear of Current or Ex-Partner: Not on file  . Emotionally Abused: Not on file  . Physically Abused: Not on file  . Sexually Abused: Not on file    Environmental and Social history  Lives in a house with a dry environment, no animals located inside the household, carpet in the bedroom, no plastic on the bed, no plastic on the pillow, no smoking ongoing with inside the household.  Objective:   Vitals:   06/07/19 1404  BP: 98/62  Pulse: 66  Temp: 97.6 F (36.4 C)  SpO2: 98%        Physical Exam Constitutional:      Appearance: She is not diaphoretic.     Comments: Throat clearing, slightly raspy voice  HENT:     Head: Normocephalic. No right periorbital erythema or left periorbital erythema.     Right Ear:  Tympanic membrane, ear canal and external ear normal.     Left Ear: Tympanic membrane, ear canal and external ear normal.     Nose: Nose normal. No mucosal edema or rhinorrhea.     Mouth/Throat:     Pharynx: Uvula midline. No oropharyngeal exudate.  Eyes:     General: Lids are normal.     Conjunctiva/sclera: Conjunctivae normal.     Pupils: Pupils are equal, round, and reactive to light.  Neck:     Thyroid: No thyromegaly.     Trachea: Trachea normal. No tracheal tenderness or tracheal deviation.  Cardiovascular:     Rate and Rhythm: Normal rate and regular rhythm.     Heart sounds: Normal heart sounds, S1 normal and S2 normal. No murmur.  Pulmonary:     Effort: Pulmonary effort is normal. No respiratory distress.     Breath sounds: Normal breath sounds. No stridor. No wheezing or rales.  Chest:     Chest wall: No tenderness.  Abdominal:     General: There is no distension.     Palpations: Abdomen is soft. There is no mass.     Tenderness: There is no abdominal tenderness. There is no guarding or rebound.  Musculoskeletal:        General: No tenderness.  Lymphadenopathy:     Head:     Right side of head: No tonsillar adenopathy.     Left side of head: No tonsillar adenopathy.     Cervical: No cervical adenopathy.  Skin:    Coloration: Skin is not pale.     Findings: No erythema or rash.     Nails: There is no clubbing.  Neurological:     Mental Status: She is alert.     Diagnostics: Allergy skin tests were performed.  She demonstrated hypersensitivity to house dust mite, cat, weeds, grasses, and molds.  Spirometry was performed and demonstrated an FEV1 of 2.29 @ 92 % of predicted. FEV1/FVC =  0.78  Results of blood tests obtained 26 May 2019 identified creatinine 0.87 mg/DL, AST 18 U/L, ALT 13 U/L, WBC 2.4, absolute eosinophil 72, absolute lymphocyte 1205, hemoglobin 13.2, platelet 179.  Results of blood tests obtained 03 May 2019 identified serum IgE 558  KU/L  Results of blood tests obtained 15 September 2016 identified low levels of IgE antibodies directed against multiple foods including egg, milk, wheat, sesame seed, soybean, scallop, walnut, almonds, and hazelnut, negative IgA transglutaminase antibody with a serum IgA level of 248 mg/DL.  Results of a chest x-ray obtained 03 May 2019 identified the following:  The cardiac silhouette, mediastinal and hilar contours are within normal limits. There is mild hyperinflation. No infiltrates, edema or effusions. No worrisome pulmonary lesions. The bony thorax is intact.  Assessment and Plan:    1. Perennial allergic rhinitis   2. LPRD (laryngopharyngeal reflux disease)     1.  Allergen avoidance measures - dust mite , pollens, molds, cat  2.  Treat and prevent inflammation:   A.  OTC Nasacort-1 spray each nostril 1 time per day  B.  Montelukast 10 mg - 1 tablet 1 time per day  3.  Treat and prevent reflux:   A.  Eliminate all oil supplementation  B.  Increase omeprazole to 40 mg twice a day  C.  Consistently use famotidine 40 mg in evening  4.  If needed:   A.  Cetirizine 10 mg - 1 tablet 1 time per day  5.  Return to clinic in 4 weeks or earlier if problem  6.  Obtain Covid vaccine when available  Novelle appears to have a inflamed and irritated respiratory tract secondary to combination of atopic respiratory disease and reflux induced respiratory disease.  We will treat her with allergen avoidance measures, the use of anti-inflammatory agents for her airway, and more aggressive therapy directed against reflux as noted above over the course of the next 4 weeks and regroup with her at that point in time to consider further evaluation and treatment pending her response to this approach.  Jiles Prows, MD Allergy / Immunology [facility]

## 2019-06-07 NOTE — Patient Instructions (Addendum)
  1.  Allergen avoidance measures - dust mite, pollens, molds, cat  2.  Treat and prevent inflammation:   A.  OTC Nasacort-1 spray each nostril 1 time per day  B.  Montelukast 10 mg - 1 tablet 1 time per day  3.  Treat and prevent reflux:   A.  Eliminate all oil supplementation  B.  Increase omeprazole to 40 mg twice a day  C.  Consistently use famotidine 40 mg in evening  4.  If needed:   A.  Cetirizine 10 mg - 1 tablet 1 time per day  5.  Return to clinic in 4 weeks or earlier if problem  6.  Obtain Covid vaccine when available

## 2019-06-08 ENCOUNTER — Telehealth: Payer: Self-pay | Admitting: Allergy and Immunology

## 2019-06-08 NOTE — Telephone Encounter (Signed)
Patient was seen yesterday, 06-07-19 and has allergy testing done. She says her back and arms are still itching. She would like to know what she can do about that.

## 2019-06-08 NOTE — Telephone Encounter (Signed)
Patient informed to start medications that Dr. Neldon Mc had prescribed (cetirizine, montelukast). Patient verbalized understanding of this. I also let her know that she could use some otc hydrocortisone cream to alleviate itching and an ice pack if she felt the areas were a touch warm. Please advise on further instructions if any.

## 2019-06-13 ENCOUNTER — Encounter: Payer: Self-pay | Admitting: Allergy and Immunology

## 2019-07-12 ENCOUNTER — Ambulatory Visit: Payer: Medicare Other | Admitting: Allergy and Immunology

## 2019-08-05 ENCOUNTER — Ambulatory Visit (INDEPENDENT_AMBULATORY_CARE_PROVIDER_SITE_OTHER): Payer: Medicare Other | Admitting: Otolaryngology

## 2019-08-10 ENCOUNTER — Other Ambulatory Visit: Payer: Self-pay

## 2019-08-10 ENCOUNTER — Encounter (INDEPENDENT_AMBULATORY_CARE_PROVIDER_SITE_OTHER): Payer: Self-pay | Admitting: Otolaryngology

## 2019-08-10 ENCOUNTER — Ambulatory Visit (INDEPENDENT_AMBULATORY_CARE_PROVIDER_SITE_OTHER): Payer: Medicare Other | Admitting: Otolaryngology

## 2019-08-10 VITALS — Temp 97.2°F

## 2019-08-10 DIAGNOSIS — H6123 Impacted cerumen, bilateral: Secondary | ICD-10-CM | POA: Diagnosis not present

## 2019-08-10 DIAGNOSIS — M542 Cervicalgia: Secondary | ICD-10-CM

## 2019-08-10 NOTE — Progress Notes (Signed)
HPI: [name] is a 69 y.o. female who returns today for evaluation of left neck complaints as well as ear complaints.  She has described pain under the left side of her jaw that comes and goes but started a couple weeks ago.  She also describes some discomfort deep in her ears and notices some pink discharge in the wax she cleans.  She also complains of occasional pinkish spit when she rinses her mouth. Patient is on Nasacort.  The pain underneath the left jaw comes and goes but is not painful today..  Past Medical History:  Diagnosis Date  . Arthritis    cervical and lumbar  . Depression   . GERD (gastroesophageal reflux disease)   . Hyperlipidemia   . IBS (irritable bowel syndrome)   . Prediabetes   . Vitamin D deficiency    Past Surgical History:  Procedure Laterality Date  . CARDIAC CATHETERIZATION  2011   normal (Dr. Terrence Dupont)  . RADIOACTIVE SEED GUIDED EXCISIONAL BREAST BIOPSY Right 03/11/2016   Procedure: RIGHT RADIOACTIVE SEED GUIDED EXCISIONAL BREAST BIOPSY;  Surgeon: Rolm Bookbinder, MD;  Location: [facility];  Service: General;  Laterality: Right;   Social History   Socioeconomic History  . Marital status: Married    Spouse name: Not on file  . Number of children: 0  . Years of education: Not on file  . Highest education level: Not on file  Occupational History  . Occupation: retired Copywriter, advertising  Tobacco Use  . Smoking status: Never Smoker  . Smokeless tobacco: Never Used  Substance and Sexual Activity  . Alcohol use: Yes    Comment: Rare  . Drug use: No  . Sexual activity: Yes    Birth control/protection: Post-menopausal    Comment: 1st intercourse 61 yo-5 partners  Other Topics Concern  . Not on file  Social History Narrative  . Not on file   Social Determinants of Health   Financial Resource Strain:   . Difficulty of Paying Living Expenses: Not on file  Food Insecurity:   . Worried About Charity fundraiser in the Last  Year: Not on file  . Ran Out of Food in the Last Year: Not on file  Transportation Needs:   . Lack of Transportation (Medical): Not on file  . Lack of Transportation (Non-Medical): Not on file  Physical Activity:   . Days of Exercise per Week: Not on file  . Minutes of Exercise per Session: Not on file  Stress:   . Feeling of Stress : Not on file  Social Connections:   . Frequency of Communication with Friends and Family: Not on file  . Frequency of Social Gatherings with Friends and Family: Not on file  . Attends Religious Services: Not on file  . Active Member of Clubs or Organizations: Not on file  . Attends Archivist Meetings: Not on file  . Marital Status: Not on file   Family History  Problem Relation Age of Onset  . Hypertension Mother   . Diabetes Father   . Heart failure Father   . Cancer Maternal Grandmother        Lung  . Allergic rhinitis Neg Hx   . Angioedema Neg Hx   . Asthma Neg Hx   . Atopy Neg Hx   . Eczema Neg Hx   . Immunodeficiency Neg Hx   . Urticaria Neg Hx    Allergies  Allergen Reactions  . Bactrim [Sulfamethoxazole-Trimethoprim] Other (See  Comments)    Patient preference to take medication without sulfa   Prior to Admission medications   Medication Sig Start Date End Date Taking? Authorizing Provider  aspirin 81 MG tablet Take 81 mg by mouth daily.   Yes [provider]  cetirizine (ZYRTEC) 10 MG tablet Take 10 mg by mouth daily as needed for allergies.   Yes [provider]  Cholecalciferol (D-3-5) 5000 UNITS capsule Take 5,000 Units by mouth daily.   Yes [provider]  ezetimibe (ZETIA) 10 MG tablet Take 1 tablet Daily for Cholesterol 02/17/19  Yes Unk Pinto, MD  famotidine (PEPCID) 40 MG tablet Take 1 tablet by mouth once daily in the evening 06/07/19  Yes Kozlow, Donnamarie Poag, MD  Flaxseed, Linseed, (FLAXSEED OIL PO) Take by mouth daily.   Yes [provider]  Lutein 20 MG TABS Take by mouth.    Yes [provider]  meloxicam (MOBIC) 15 MG tablet Take 1 tablet Daily with Food for Pain & Inflammation 05/26/19  Yes Unk Pinto, MD  montelukast (SINGULAIR) 10 MG tablet Take 1 tablet (10 mg total) by mouth at bedtime. 06/07/19  Yes Kozlow, Donnamarie Poag, MD  Multiple Vitamins-Minerals (WOMENS MULTI VITAMIN & MINERAL PO) Take 1 tablet by mouth daily.   Yes [provider]  Omega-3 Fatty Acids (FISH OIL PO) Take by mouth daily.   Yes [provider]  omeprazole (PRILOSEC) 40 MG capsule Take 40 mg by mouth daily.   Yes [provider]  omeprazole (PRILOSEC) 40 MG capsule Take 1 capsule by mouth twice daily 06/07/19  Yes Kozlow, Donnamarie Poag, MD  [place] Patient takes MOM as needed.   Yes [provider]  OVER THE COUNTER MEDICATION    Yes [provider]  RESTASIS 0.05 % ophthalmic emulsion instill 1 drop into both eyes twice a day 11/08/14  Yes [provider]     Positive ROS: Otherwise negative  All other systems have been reviewed and were otherwise negative with the exception of those mentioned in the HPI and as above.  Physical Exam: Constitutional: Alert, well-appearing, no acute distress Ears: External ears without lesions or tenderness.  She had wax buildup in both ears but this was nonobstructing and was cleaned in the office today using a curette and forceps.  Ear canals are otherwise clear there is no blood no evidence of external otitis.  TMs are clear bilaterally with good mobility on pneumatic otoscopy.  Hearing screening with a tuning fork revealed symmetric good hearing in both ears. Nasal: External nose without lesions. Septum midline. Clear nasal passages.  No signs of infection. Oral: Lips and gums without lesions. Tongue and palate mucosa without lesions. Posterior oropharynx clear.  Palpation of left submandibular gland is benign with no tenderness and no swelling.  Drainage from the submandibular  ducts is clear. Neck: In the region of the tenderness left upper neck there is no palpable masses or adenopathy noted and no tenderness noted presently Respiratory: Breathing comfortably  Skin: No facial/neck lesions or rash noted.  Cerumen impaction removal  Date/Time: 08/10/2019 1:11 PM Performed by: Rozetta Nunnery, MD Authorized by: Rozetta Nunnery, MD   Consent:    Consent obtained:  Verbal   Consent given by:  Patient   Risks discussed:  Pain and bleeding Procedure details:    Location:  L ear and R ear   Procedure type: curette and forceps   Post-procedure details:    Inspection:  TM  intact and canal normal   Hearing quality:  Improved   Patient tolerance of procedure:  Tolerated well, no immediate complications Comments:     TMs are clear bilaterally with clear canals bilaterally.    Assessment: Wax buildup with clear ear canals and TMs otherwise. Left neck discomfort questionable etiology.  Normal examination  Plan: Recommended use of Motrin or Advil as needed any neck discomfort. Reassured her of normal examination with no signs of infection or neoplasm.   Radene Journey, MD

## 2019-08-11 ENCOUNTER — Encounter (HOSPITAL_COMMUNITY): Payer: Self-pay

## 2019-08-11 ENCOUNTER — Other Ambulatory Visit: Payer: Self-pay

## 2019-08-11 ENCOUNTER — Emergency Department (HOSPITAL_COMMUNITY)
Admission: EM | Admit: 2019-08-11 | Discharge: 2019-08-11 | Disposition: A | Discharge status: Home / Self Care | Payer: Medicare Other | Attending: Emergency Medicine | Admitting: Emergency Medicine

## 2019-08-11 DIAGNOSIS — R202 Paresthesia of skin: Secondary | ICD-10-CM | POA: Insufficient documentation

## 2019-08-11 DIAGNOSIS — Z79899 Other long term (current) drug therapy: Secondary | ICD-10-CM | POA: Insufficient documentation

## 2019-08-11 DIAGNOSIS — Z7982 Long term (current) use of aspirin: Secondary | ICD-10-CM | POA: Insufficient documentation

## 2019-08-11 DIAGNOSIS — R200 Anesthesia of skin: Secondary | ICD-10-CM | POA: Diagnosis present

## 2019-08-11 NOTE — ED Provider Notes (Signed)
[facility] DEPT Provider Note   CSN: [id number] Arrival date & time: 08/11/19  0222     History Chief Complaint  Patient presents with  . Numbness    [name] is a 69 y.o. female with a past medical history of vitamin D deficiency, prediabetes, hyperlipidemia and reflux who presents emergency department with chief complaint of left lower extremity numbness.  Patient states that she fell asleep tonight in her swivel chair when she awoke she noticed a circular region of numbness on the left posterior calf and a strip of numbness on the left upper thigh.  She states that normally those kinds of things go away but it was persistent so she called [place] [facility] triage line and they sent her to the emergency department.  She denies crossing her legs while she was asleep but is unsure.  She denies significant headache, changes in vision, unilateral weakness, difficulty with speech or swallowing.  HPI     Past Medical History:  Diagnosis Date  . Arthritis    cervical and lumbar  . Depression   . GERD (gastroesophageal reflux disease)   . Hyperlipidemia   . IBS (irritable bowel syndrome)   . Prediabetes   . Vitamin D deficiency     Patient Active Problem List   Diagnosis Date Noted  . Upper airway cough syndrome 05/04/2019  . DOE (dyspnea on exertion) 05/03/2019  . 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 Osteoporosis 08/11/2017  . Abnormal glucose 06/25/2017  . 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 GERD (gastroesophageal reflux disease)   . Vitamin D deficiency   . Hyperlipidemia     Past Surgical History:  Procedure Laterality Date  . CARDIAC CATHETERIZATION  2011   normal (Dr. Terrence [facility])  . RADIOACTIVE SEED GUIDED EXCISIONAL BREAST BIOPSY Right 03/11/2016   Procedure: RIGHT RADIOACTIVE SEED GUIDED EXCISIONAL BREAST BIOPSY;  Surgeon: Rolm Bookbinder, MD;  Location: [facility];  Service: General;  Laterality: Right;     OB History    Gravida  0   Para  0   Term  0   Preterm  0   AB  0   Living  0     SAB  0   TAB  0   Ectopic  0   Multiple  0   Live Births  0           Family History  Problem Relation Age of Onset  . Hypertension Mother   . Diabetes Father   . Heart failure Father   . Cancer Maternal Grandmother        Lung  . Allergic rhinitis Neg Hx   . Angioedema Neg Hx   . Asthma Neg Hx   . Atopy Neg Hx   . Eczema Neg Hx   . Immunodeficiency Neg Hx   . Urticaria Neg Hx     Social History   Tobacco Use  . Smoking status: Never Smoker  . Smokeless tobacco: Never Used  Substance Use Topics  . Alcohol use: Yes    Comment: Rare  . Drug use: No    Home Medications Prior to Admission medications   Medication Sig Start Date End Date Taking? Authorizing Provider  aspirin 81 MG tablet Take 81 mg by mouth daily.    [provider]  cetirizine (ZYRTEC) 10 MG tablet Take 10 mg by mouth daily as needed for allergies.    [provider]  Cholecalciferol (D-3-5) 5000 UNITS capsule Take 5,000 Units by mouth  daily.    [provider]  ezetimibe (ZETIA) 10 MG tablet Take 1 tablet Daily for Cholesterol 02/17/19   Unk Pinto, MD  famotidine (PEPCID) 40 MG tablet Take 1 tablet by mouth once daily in the evening 06/07/19   Kozlow, Donnamarie Poag, MD  Flaxseed, Linseed, (FLAXSEED OIL PO) Take by mouth daily.    [provider]  Lutein 20 MG TABS Take by mouth.    [provider]  meloxicam (MOBIC) 15 MG tablet Take 1 tablet Daily with Food for Pain & Inflammation 05/26/19   Unk Pinto, MD  montelukast (SINGULAIR) 10 MG tablet Take 1 tablet (10 mg total) by mouth at bedtime. 06/07/19   Kozlow, Donnamarie Poag, MD  Multiple Vitamins-Minerals (WOMENS MULTI VITAMIN & MINERAL PO) Take 1 tablet by mouth daily.    [provider]  Omega-3 Fatty Acids (FISH OIL PO) Take by mouth daily.    [provider]  omeprazole (PRILOSEC) 40 MG capsule Take 40  mg by mouth daily.    [provider]  omeprazole (PRILOSEC) 40 MG capsule Take 1 capsule by mouth twice daily 06/07/19   Kozlow, Donnamarie Poag, MD  OVER THE COUNTER MEDICATION Patient takes MOM as needed.    [provider]  OVER THE COUNTER MEDICATION     [provider]  RESTASIS 0.05 % ophthalmic emulsion instill 1 drop into both eyes twice a day 11/08/14   [provider]    Allergies    Bactrim [sulfamethoxazole-trimethoprim]  Review of Systems   Review of Systems Ten systems reviewed and are negative for acute change, except as noted in the HPI.   Physical Exam Updated Vital Signs BP 102/72 (BP Location: Left Arm)   Pulse 66   Temp 97.7 F (36.5 C) (Oral)   Resp 15   Ht 5\' 4"  (1.626 m)   Wt 63 kg   SpO2 99%   BMI 23.86 kg/m   Physical Exam Vitals and nursing note reviewed.  Constitutional:      General: She is not in acute distress.    Appearance: She is well-developed. She is not diaphoretic.  HENT:     Head: Normocephalic and atraumatic.  Eyes:     General: No scleral icterus.    Conjunctiva/sclera: Conjunctivae normal.     Pupils: Pupils are equal, round, and reactive to light.     Comments: No horizontal, vertical or rotational nystagmus  Neck:     Comments: Full active and passive ROM without pain No midline or paraspinal tenderness No nuchal rigidity or meningeal signs Cardiovascular:     Rate and Rhythm: Normal rate and regular rhythm.     Heart sounds: Normal heart sounds. No murmur. No friction rub. No gallop.   Pulmonary:     Effort: Pulmonary effort is normal. No respiratory distress.     Breath sounds: Normal breath sounds. No wheezing or rales.  Abdominal:     General: Bowel sounds are normal. There is no distension.     Palpations: Abdomen is soft. There is no mass.     Tenderness: There is no abdominal tenderness. There is no guarding or rebound.  Musculoskeletal:        General: Normal range of motion.      Cervical back: Normal range of motion and neck supple.     Right lower leg: No edema.     Left lower leg: No edema.  Lymphadenopathy:     Cervical: No cervical adenopathy.  Skin:  General: Skin is warm and dry.     Findings: No rash.  Neurological:     Mental Status: She is alert and oriented to person, place, and time.     Cranial Nerves: No cranial nerve deficit.     Motor: No abnormal muscle tone.     Coordination: Coordination normal.     Comments: Mental Status:  Alert, oriented, thought content appropriate. Speech fluent without evidence of aphasia. Able to follow 2 step commands without difficulty.  Cranial Nerves:  II:  Peripheral visual fields grossly normal, pupils equal, round, reactive to light III,IV, VI: ptosis not present, extra-ocular motions intact bilaterally  V,VII: smile symmetric, facial light touch sensation equal VIII: hearing grossly normal bilaterally  IX,X: midline uvula rise  XI: bilateral shoulder shrug equal and strong XII: midline tongue extension  Motor:  5/5 in upper and lower extremities bilaterally including strong and equal grip strength and dorsiflexion/plantar flexion Sensory: Pinprick and light touch normal in all extremities.  Cerebellar: normal finger-to-nose with bilateral upper extremities Gait: normal gait and balance CV: distal pulses palpable throughout   Psychiatric:        Behavior: Behavior normal.        Thought Content: Thought content normal.        Judgment: Judgment normal.     ED Results / Procedures / Treatments   Labs (all labs ordered are listed, but only abnormal results are displayed) Labs Reviewed - No data to display  EKG None  Radiology No results found.  Procedures Procedures (including critical care time)  Medications Ordered in ED Medications - No data to display  ED Course  I have reviewed the triage vital signs and the nursing notes.  Pertinent labs & imaging results that were available during  my care of the patient were reviewed by me and considered in my medical decision making (see chart for details).    MDM Rules/Calculators/A&P                      Patient seen and shared visit with Dr. Roxanne Mins.  She has a normal neurologic exam, no objective findings.  She has no lower extremity swelling suggestive of DVT.  Normal strength in the extremities.  Patient does have a history of chronic lower back pain this may represent radiculopathy versus compressive peripheral neuropathy.  Patient unlikely to have new onset MS however she may follow-up closely in the outpatient setting for persistent symptoms with neurology.  She appears otherwise appropriate for discharge at this time. Final Clinical Impression(s) / ED Diagnoses Final diagnoses:  None    Rx / DC Orders ED Discharge Orders    None       Margarita Mail, PA-C [id number]    Delora Fuel, MD [id number] [phone]

## 2019-08-11 NOTE — Discharge Instructions (Addendum)
Get help right away if you: °Feel weak. °Have trouble walking or moving. °Have problems with speech, understanding, or vision. °Feel confused. °Cannot control your bladder or bowel movements. °Have numbness after an injury. °Develop new weakness in an arm or leg. °Faint. °

## 2019-08-11 NOTE — ED Triage Notes (Addendum)
Pt reports L lateral calf numbness that started this evening. She denies any injury, heat, or swelling to area. No neuro deficits noted. A&Ox4. Ambulatory with a steady gait.

## 2019-08-16 ENCOUNTER — Encounter: Payer: Self-pay | Admitting: Allergy and Immunology

## 2019-08-16 ENCOUNTER — Other Ambulatory Visit: Payer: Self-pay

## 2019-08-16 ENCOUNTER — Ambulatory Visit: Payer: Medicare Other | Admitting: Allergy and Immunology

## 2019-08-16 VITALS — BP 110/62 | HR 62 | Temp 97.9°F | Resp 16

## 2019-08-16 DIAGNOSIS — J3089 Other allergic rhinitis: Secondary | ICD-10-CM | POA: Diagnosis not present

## 2019-08-16 DIAGNOSIS — G4720 Circadian rhythm sleep disorder, unspecified type: Secondary | ICD-10-CM

## 2019-08-16 DIAGNOSIS — K219 Gastro-esophageal reflux disease without esophagitis: Secondary | ICD-10-CM | POA: Diagnosis not present

## 2019-08-16 DIAGNOSIS — G43909 Migraine, unspecified, not intractable, without status migrainosus: Secondary | ICD-10-CM

## 2019-08-16 MED ORDER — FAMOTIDINE 40 MG PO TABS: ORAL_TABLET | ORAL | 5 refills | Status: DC

## 2019-08-16 MED ORDER — OMEPRAZOLE 40 MG PO CPDR: DELAYED_RELEASE_CAPSULE | ORAL | 5 refills | Status: DC

## 2019-08-16 NOTE — Progress Notes (Signed)
[facility]   Follow-up Note  Referring Provider: Unk Pinto, MD Primary Provider: Unk Pinto, MD Date of Office Visit: 08/16/2019  Subjective:   [name] (DOB: February 05, 1951) is a 69 y.o. female who returns to the [facility] on 08/16/2019 in re-evaluation of the following:  HPI: Acelin presents to this clinic in evaluation of cough believed secondary to combination of allergic rhinitis and LPR.  She is actually a little bit better at this point in time regarding her cough. She has much less frequency of cough and it no longer disturbs her sleep. Her throat clearing has been somewhat better as has her postnasal drip and her raspy voice has been better.  She still has some clear rhinorrhea but for the most part her nose is doing well at this point.  She has performed house dust avoidance measures inside the household. She has eliminated consumption of fish oil. She discontinued her Nasacort and she has fear about starting montelukast. Likewise, she has not been using her omeprazole twice a day and in fact has been using this agent only every other day because of the fear of potential side effects. She has been using some Mucinex DM.  Tayana informs me today that she has been having headaches. She basically has a daily headache that is across her nasal bridge, temporal region, forehead, and all over her head sometimes with a tender head at the very top. It has slight throbbing quality and she gets blurred vision. She'll take acetaminophen 1 or 2 times per week but she doesn't really think this helps very much. This headache can occur in the morning and it can occur midday and can last several hours. This has been an issue that dates back many years for which she had an MRI in 2016 which apparently did not identify any significant abnormality including any abnormality of her sinus cavities. It has been more prevalent  since January 2021.  In addition, she states that her sleep is really bad. She never goes into a deep sleep and she has fractured sleep. She is somewhat sleepy during the daytime she can fall asleep during the daytime. She feels unrefreshed upon awakening in the morning. Driving does not appear to be in a big issue but occasionally she does get sleepy driving. She does not have any gasping or choking while she sleeps.  Allergies as of 08/16/2019      Reactions   Bactrim [sulfamethoxazole-trimethoprim] Other (See Comments)   Patient preference to take medication without sulfa      Medication List      aspirin 81 MG tablet Take 81 mg by mouth daily.   cetirizine 10 MG tablet Commonly known as: ZYRTEC Take 10 mg by mouth daily as needed for allergies.   D-3-5 125 MCG (5000 UT) capsule Generic drug: Cholecalciferol Take 5,000 Units by mouth daily.   ezetimibe 10 MG tablet Commonly known as: ZETIA Take 1 tablet Daily for Cholesterol   famotidine 40 MG tablet Commonly known as: PEPCID Take 1 tablet by mouth once daily in the evening   FISH OIL PO Take by mouth daily.   FLAXSEED OIL PO Take by mouth daily.   Lutein 20 MG Tabs Take by mouth.   meloxicam 15 MG tablet Commonly known as: MOBIC Take 1 tablet Daily with Food for Pain & Inflammation   montelukast 10 MG tablet Commonly known as: SINGULAIR Take 1 tablet (10 mg  total) by mouth at bedtime.   omeprazole 40 MG capsule Commonly known as: PRILOSEC Take 40 mg by mouth daily.   omeprazole 40 MG capsule Commonly known as: PRILOSEC Take 1 capsule by mouth twice daily   [place] Patient takes MOM as needed.   OVER THE COUNTER MEDICATION   Restasis 0.05 % ophthalmic emulsion Generic drug: cycloSPORINE instill 1 drop into both eyes twice a day   WOMENS MULTI VITAMIN & MINERAL PO Take 1 tablet by mouth daily.       Past Medical History:  Diagnosis Date  . Arthritis    cervical and lumbar    . Depression   . GERD (gastroesophageal reflux disease)   . Hyperlipidemia   . IBS (irritable bowel syndrome)   . Prediabetes   . Vitamin D deficiency     Past Surgical History:  Procedure Laterality Date  . CARDIAC CATHETERIZATION  2011   normal (Dr. Terrence Dupont)  . RADIOACTIVE SEED GUIDED EXCISIONAL BREAST BIOPSY Right 03/11/2016   Procedure: RIGHT RADIOACTIVE SEED GUIDED EXCISIONAL BREAST BIOPSY;  Surgeon: Rolm Bookbinder, MD;  Location: [facility];  Service: General;  Laterality: Right;    Review of systems negative except as noted in HPI / PMHx or noted below:  Review of Systems  Constitutional: Negative.   HENT: Negative.   Eyes: Negative.   Respiratory: Negative.   Cardiovascular: Negative.   Gastrointestinal: Negative.   Genitourinary: Negative.   Musculoskeletal: Negative.   Skin: Negative.   Neurological: Negative.   Endo/Heme/Allergies: Negative.   Psychiatric/Behavioral: Negative.      Objective:   Vitals:   08/16/19 0908  BP: 110/62  Pulse: 62  Resp: 16  Temp: 97.9 F (36.6 C)  SpO2: 99%          Physical Exam Constitutional:      Appearance: She is not diaphoretic.  HENT:     Head: Normocephalic.     Right Ear: Tympanic membrane, ear canal and external ear normal.     Left Ear: Tympanic membrane, ear canal and external ear normal.     Nose: Nose normal. No mucosal edema or rhinorrhea.     Mouth/Throat:     Pharynx: Uvula midline. No oropharyngeal exudate.  Eyes:     Conjunctiva/sclera: Conjunctivae normal.  Neck:     Thyroid: No thyromegaly.     Trachea: Trachea normal. No tracheal tenderness or tracheal deviation.  Cardiovascular:     Rate and Rhythm: Normal rate and regular rhythm.     Heart sounds: Normal heart sounds, S1 normal and S2 normal. No murmur.  Pulmonary:     Effort: No respiratory distress.     Breath sounds: Normal breath sounds. No stridor. No wheezing or rales.  Lymphadenopathy:     Head:     Right  side of head: No tonsillar adenopathy.     Left side of head: No tonsillar adenopathy.     Cervical: No cervical adenopathy.  Skin:    Findings: No erythema or rash.     Nails: There is no clubbing.  Neurological:     Mental Status: She is alert.     Diagnostics: none  Assessment and Plan:   1. Perennial allergic rhinitis   2. LPRD (laryngopharyngeal reflux disease)   3. Migraine syndrome   4. 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  Allergen avoidance measures - dust mite, pollens, molds, cat  2.  Treat and prevent inflammation:  A.  Loratadine 10 mg - 1 tablet 1-2 times per day  3.  Continue to Treat and prevent reflux:   A.  Eliminate all oil supplementation  B.  Increase omeprazole to 40 mg twice a day  C.  Consistently use famotidine 40 mg in evening  4. Can use Mucinex DM if needed  5.  Obtain nocturnal oximetry study for headache  6. Consider a course of immunotherapy  7. May need to obtain a sleep study in the future  8. Return to clinic in 4 weeks or earlier if problem  Shakia is better in some regards especially her cough and some of her throat issues but she still has some rhinitis and she has headaches and sleep dysfunction. I did recommend that she consider a course of immunotherapy for her atopic disease and I strongly recommended that at least for a certain period in time we aggressively treat LPR. As well, we will screen her for nocturnal deoxygenation in regard to her headaches and sleep dysfunction and possibly obtain a sleep study in the future to further explore this issue. I will regroup with her in 4 weeks to assess her response to this approach.  Allena Katz, MD Allergy / Immunology [facility]

## 2019-08-16 NOTE — Patient Instructions (Addendum)
  1.  Allergen avoidance measures - dust mite, pollens, molds, cat  2.  Treat and prevent inflammation:   A.  Loratadine 10 mg - 1 tablet 1-2 times per day  3.  Continue to Treat and prevent reflux:   A.  Eliminate all oil supplementation  B.  Increase omeprazole to 40 mg twice a day  C.  Consistently use famotidine 40 mg in evening  4. Can use Mucinex DM if needed  5.  Obtain nocturnal oximetry study for headache  6. Consider a course of immunotherapy  7. May need to obtain a sleep study in the future  8. Return to clinic in 4 weeks or earlier if problem

## 2019-08-17 ENCOUNTER — Encounter: Payer: Self-pay | Admitting: Allergy and Immunology

## 2019-08-18 ENCOUNTER — Encounter: Payer: Self-pay | Admitting: Neurology

## 2019-08-18 ENCOUNTER — Other Ambulatory Visit: Payer: Self-pay

## 2019-08-18 ENCOUNTER — Ambulatory Visit: Payer: Medicare Other | Admitting: Neurology

## 2019-08-18 VITALS — BP 102/59 | HR 55 | Temp 97.4°F | Ht 64.75 in | Wt 141.0 lb

## 2019-08-18 DIAGNOSIS — G5622 Lesion of ulnar nerve, left upper limb: Secondary | ICD-10-CM | POA: Diagnosis not present

## 2019-08-18 DIAGNOSIS — G5702 Lesion of sciatic nerve, left lower limb: Secondary | ICD-10-CM | POA: Diagnosis not present

## 2019-08-18 NOTE — Patient Instructions (Addendum)
Cubital Tunnel Syndrome  Cubital tunnel syndrome is a condition that causes pain and weakness of the forearm and hand. It happens when one of the nerves that runs along the inside of the elbow joint (ulnar nerve) becomes irritated. This condition is usually caused by repeated arm motions that are done during sports or work-related activities. What are the causes? This condition may be caused by:  Increased pressure on the ulnar nerve at the elbow, arm, or forearm. This can result from: ? Irritation caused by repeated elbow bending. ? Poorly healed elbow fractures. ? Tumors in the elbow. These are usually noncancerous (benign). ? Scar tissue that develops in the elbow after an injury. ? Bony growths (spurs) near the ulnar nerve.  Stretching of the nerve due to loose elbow ligaments.  Trauma to the nerve at the elbow. What increases the risk? The following factors may make you more likely to develop this condition:  Doing manual labor that requires frequent bending of the elbow.  Playing sports that include repeated or strenuous throwing motions, such as baseball.  Playing contact sports, such as football or lacrosse.  Not warming up properly before activities.  Having diabetes.  Having an underactive thyroid (hypothyroidism). What are the signs or symptoms? Symptoms of this condition include:  Clumsiness or weakness of the hand.  Tenderness of the inner elbow.  Aching or soreness of the inner elbow, forearm, or fingers, especially the little finger or the ring finger.  Increased pain when forcing the elbow to bend.  Reduced control when throwing objects.  Tingling, numbness, or a burning feeling inside the forearm or in part of the hand or fingers, especially the little finger or the ring finger.  Sharp pains that shoot from the elbow down to the wrist and hand.  The inability to grip or pinch hard. How is this diagnosed? This condition is diagnosed based  on:  Your symptoms and medical history. Your health care provider will also ask for details about any injury.  A physical exam. You may also have tests, including:  Electromyogram (EMG). This test measures electrical signals sent by your nerves into the muscles.  Nerve conduction study. This test measures how well electrical signals pass through your nerves.  Imaging tests, such as X-rays, ultrasound, and MRI. These tests check for possible causes of your condition. How is this treated? This condition may be treated by:  Stopping the activities that are causing your symptoms to get worse.  Icing and taking medicines to reduce pain and swelling.  Wearing a splint to prevent your elbow from bending, or wearing an elbow pad where the ulnar nerve is closest to the skin.  Working with a physical therapist in less severe cases. This may help to: ? Decrease your symptoms. ? Improve the strength and range of motion of your elbow, forearm, and hand. If these treatments do not help, surgery may be needed. Follow these instructions at home: If you have a splint:  Wear the splint as told by your health care provider. Remove it only as told by your health care provider.  Loosen the splint if your fingers tingle, become numb, or turn cold and blue.  Keep the splint clean.  If the splint is not waterproof: ? Do not let it get wet. ? Cover it with a watertight covering when you take a bath or shower. Managing pain, stiffness, and swelling   If directed, put ice on the injured area: ? Put ice in a  plastic bag. ? Place a towel between your skin and the bag. ? Leave the ice on for 20 minutes, 2-3 times a day.  Move your fingers often to avoid stiffness and to lessen swelling.  Raise (elevate) the injured area above the level of your heart while you are sitting or lying down. General instructions  Take over-the-counter and prescription medicines only as told by your health care  provider.  Do any exercise or physical therapy as told by your health care provider.  Do not drive or use heavy machinery while taking prescription pain medicine.  If you were given an elbow pad, wear it as told by your health care provider.  Keep all follow-up visits as told by your health care provider. This is important. Contact a health care provider if:  Your symptoms get worse.  Your symptoms do not get better with treatment.  You have new pain.  Your hand on the injured side feels numb or cold. Summary  Cubital tunnel syndrome is a condition that causes pain and weakness of the forearm and hand.  You are more likely to develop this condition if you do work or play sports that involve repeated arm movements.  This condition is often treated by stopping repetitive activities, applying ice, and using anti-inflammatory medicines.  In rare cases, surgery may be needed. This information is not intended to replace advice given to you by your health care provider. Make sure you discuss any questions you have with your health care provider. Document Revised: 10/19/2017 Document Reviewed: 10/19/2017 Elsevier Patient Education  [place]. Common Peroneal Nerve Entrapment  Common peroneal nerve entrapment is a condition that can make it hard to lift a foot. The condition results from pressure on a nerve in the lower leg called the common peroneal nerve. Your common peroneal nerve provides feeling to your outer lower leg and foot. It also supplies the muscles that move your foot and toes upward and outward. What are the causes? This condition may be caused by:  Sitting cross-legged, squatting, or kneeling for long periods of time.  A hard, direct hit to the side of the lower leg.  Swelling from a knee injury.  A break (fracture) in one of the lower leg bones.  Wearing a boot or cast that ends just below the knee.  A growth or cyst near the nerve. What increases the  risk? This condition is more likely to develop in people who play:  Contact sports, such as football or hockey.  Sports where you wear high and stiff boots, such as skiing. What are the signs or symptoms? Symptoms of this condition include:  Trouble lifting your foot up (foot drop).  Tripping often.  Your foot hitting the ground harder than normal as you walk.  Numbness, tingling, or pain in the outside of the knee, outside of the lower leg, and top of the foot.  Sensitivity to pressure on the front or side of the leg. How is this diagnosed? This condition may be diagnosed based on:  Your symptoms.  Your medical history.  A physical exam.  Tests, such as: ? An X-ray to check the bones of your knee and leg. ? MRI to check tendons that attach to the side of your knee. ? An ultrasound to check for a growth or cyst. ? An electromyogram (EMG) to check your nerves. During your physical exam, your health care provider will check for numbness in your leg and test the strength of your  lower leg muscles. He or she may tap the side of your lower leg to see if that causes tingling. How is this treated? Treatment for this condition may include:  Avoiding activities that make symptoms worse.  Using a brace to hold up your foot and toes.  Taking anti-inflammatory pain medicines to relieve swelling and lessen pain.  Having medicines injected into your ankle joint to lessen pain and swelling.  Doing exercises to help you regain or maintain movement (physical therapy).  Surgery to take pressure off the nerve. This may be needed if there is no improvement after 2-3 months or if there is a growth pushing on the nerve.  Returning gradually to full activity. Follow these instructions at home: If you have a brace:  Wear it as told by your health care provider. Remove it only as told by your health care provider.  Loosen the brace if your toes tingle, become numb, or turn cold and  blue.  Keep the brace clean.  If the brace is not waterproof: ? Do not let it get wet. ? Cover it with a watertight covering when you take a bath or a shower.  Ask your health care provider when it is safe to drive with a brace on your foot. Activity  Return to your normal activities as told by your health care provider. Ask your health care provider what activities are safe for you.  Do not do any activities that make pain or swelling worse.  Do exercises as told by your health care provider. General instructions  Take over-the-counter and prescription medicines only as told by your health care provider.  Do not put your full weight on your knee until your health care provider says you can. Use crutches as directed by your health care provider.  Keep all follow-up visits as told by your health care provider. This is important. How is this prevented?  Wear supportive footwear that is appropriate for your athletic activity.  Avoid athletic activities that cause ankle pain or swelling.  Wear protective padding over your lower legs when playing contact sports.  Make sure your boots do not put extra pressure on the area just below your knees.  Do not sit cross-legged for long periods of time. Contact a health care provider if:  Your symptoms do not get better in 2-3 months.  The weakness or numbness in your leg or foot gets worse. Summary  Common peroneal nerve entrapment is a condition that results from pressure on a nerve in the lower leg called the common peroneal nerve.  This condition may be caused by a hard hit, swelling, a fracture, or a cyst in the lower leg.  Treatment may include rest, a brace, medicines, and physical therapy. Sometimes surgery is needed.  Do not do any activities that make pain or swelling worse. This information is not intended to replace advice given to you by your health care provider. Make sure you discuss any questions you have with your  health care provider. Document Revised: 04/12/2018 Document Reviewed: 04/12/2018 Elsevier Patient Education  [place].   Sleep Apnea Sleep apnea is a condition in which breathing pauses or becomes shallow during sleep. Episodes of sleep apnea usually last 10 seconds or longer, and they may occur as many as 20 times an hour. Sleep apnea disrupts your sleep and keeps your body from getting the rest that it needs. This condition can increase your risk of certain health problems, including:  Heart attack.  Stroke.  Obesity.  Diabetes.  Heart failure.  Irregular heartbeat. What are the causes? There are three kinds of sleep apnea:  Obstructive sleep apnea. This kind is caused by a blocked or collapsed airway.  Central sleep apnea. This kind happens when the part of the brain that controls breathing does not send the correct signals to the muscles that control breathing.  Mixed sleep apnea. This is a combination of obstructive and central sleep apnea. The most common cause of this condition is a collapsed or blocked airway. An airway can collapse or become blocked if:  Your throat muscles are abnormally relaxed.  Your tongue and tonsils are larger than normal.  You are overweight.  Your airway is smaller than normal. What increases the risk? You are more likely to develop this condition if you:  Are overweight.  Smoke.  Have a smaller than normal airway.  Are elderly.  Are female.  Drink alcohol.  Take sedatives or tranquilizers.  Have a family history of sleep apnea. What are the signs or symptoms? Symptoms of this condition include:  Trouble staying asleep.  Daytime sleepiness and tiredness.  Irritability.  Loud snoring.  Morning headaches.  Trouble concentrating.  Forgetfulness.  Decreased interest in sex.  Unexplained sleepiness.  Mood swings.  Personality changes.  Feelings of depression.  Waking up often during the night to  urinate.  Dry mouth.  Sore throat. How is this diagnosed? This condition may be diagnosed with:  A medical history.  A physical exam.  A series of tests that are done while you are sleeping (sleep study). These tests are usually done in a sleep lab, but they may also be done at home. How is this treated? Treatment for this condition aims to restore normal breathing and to ease symptoms during sleep. It may involve managing health issues that can affect breathing, such as high blood pressure or obesity. Treatment may include:  Sleeping on your side.  Using a decongestant if you have nasal congestion.  Avoiding the use of depressants, including alcohol, sedatives, and narcotics.  Losing weight if you are overweight.  Making changes to your diet.  Quitting smoking.  Using a device to open your airway while you sleep, such as: ? An oral appliance. This is a custom-made mouthpiece that shifts your lower jaw forward. ? A continuous positive airway pressure (CPAP) device. This device blows air through a mask when you breathe out (exhale). ? A nasal expiratory positive airway pressure (EPAP) device. This device has valves that you put into each nostril. ? A bi-level positive airway pressure (BPAP) device. This device blows air through a mask when you breathe in (inhale) and breathe out (exhale).  Having surgery if other treatments do not work. During surgery, excess tissue is removed to create a wider airway. It is important to get treatment for sleep apnea. Without treatment, this condition can lead to:  High blood pressure.  Coronary artery disease.  In men, an inability to achieve or maintain an erection (impotence).  Reduced thinking abilities. Follow these instructions at home: Lifestyle  Make any lifestyle changes that your health care provider recommends.  Eat a healthy, well-balanced diet.  Take steps to lose weight if you are overweight.  Avoid using depressants,  including alcohol, sedatives, and narcotics.  Do not use any products that contain nicotine or tobacco, such as cigarettes, e-cigarettes, and chewing tobacco. If you need help quitting, ask your health care provider. General instructions  Take over-the-counter and prescription medicines  only as told by your health care provider.  If you were given a device to open your airway while you sleep, use it only as told by your health care provider.  If you are having surgery, make sure to tell your health care provider you have sleep apnea. You may need to bring your device with you.  Keep all follow-up visits as told by your health care provider. This is important. Contact a health care provider if:  The device that you received to open your airway during sleep is uncomfortable or does not seem to be working.  Your symptoms do not improve.  Your symptoms get worse. Get help right away if:  You develop: ? Chest pain. ? Shortness of breath. ? Discomfort in your back, arms, or stomach.  You have: ? Trouble speaking. ? Weakness on one side of your body. ? Drooping in your face. These symptoms may represent a serious problem that is an emergency. Do not wait to see if the symptoms will go away. Get medical help right away. Call your local emergency services (911 in the [place] Do not drive yourself to the hospital. Summary  Sleep apnea is a condition in which breathing pauses or becomes shallow during sleep.  The most common cause is a collapsed or blocked airway.  The goal of treatment is to restore normal breathing and to ease symptoms during sleep. This information is not intended to replace advice given to you by your health care provider. Make sure you discuss any questions you have with your health care provider. Document Revised: 11/17/2018 Document Reviewed: 01/26/2018 Elsevier Patient Education  [place].

## 2019-08-18 NOTE — Progress Notes (Signed)
[facility]    Provider:  Dr Jaynee Eagles Requesting Provider: Emergency room Primary Care Provider:  Unk Pinto, MD  CC:  Left arm tingling, left leg numbness, right toe pain  HPI:  [name] is a 69 y.o. female here as requested by emergency room for multiple issues.  She has a past medical history of prediabetes, irritable bowel syndrome, hyperlipidemia, depression, arthritis.  I reviewed in the emergency room notes: Patient was seen after falling asleep February 25 in her swivel chair when she awoke she noticed a circular region of numbness on the left posterior calf and a strip of numbness in the left upper thigh, normally these things go away, she called [place] [facility] triage line and they sent her to the emergency room, she denied crossing her legs when she was asleep but is unsure, denied any other focal symptoms.  She had a normal neurologic exam, no lower extremity swelling suggestive of DVT, normal strength and exams, the emergency room thought this may represent radiculopathy versus compressive peripheral neuropathy, unlikely to be any other issues.  She was discharged and referred to the emergency room.  Patient is here alone, she reports that she woke up 2/25 with numbness in the lateral knee are on the left, numbness, she had her leg draped over the arm and she was "contorted" she didn;t move for a long tme. It is improving. No weakness. She also has some left arm pain, radiating from the shoulder area (not her neck) and radiating down the arm to the digits 4-5. It is getting worse. Hard to sleep. She notices some cramping and weakness on the ulnar side of the hand. No neck pain or pain radiating from her neck. She also has some knuckling at digit 2 of the right toe likely orthopaedic. No numbness or tingling in the feet so likely orthopaedic.  She feels she is improved with the leg symptoms, left arm is stable as is the toe.  Sleeping on the arm makes it worse  nothing makes it better.  The toe is just stable, nothing makes it better or worse.  No other focal neurologic deficits, associated symptoms, inciting events or modifiable factors.  Reviewed notes, labs and imaging from outside physicians, which showed:  I reviewed labs including thyroid which was normal, CBC showed decreased white blood cells 2.4 which is chronic and followed by primary care, CMP was normal, magnesium normal, LDL slightly elevated at 113 otherwise unremarkable labs which were taken December 2020.  Review of Systems: Patient complains of symptoms per HPI as well as the following symptoms: headaches, numbness and tingling. Pertinent negatives and positives per HPI. All others negative.   Social History   Socioeconomic History  . Marital status: Married    Spouse name: Not on file  . Number of children: 0  . Years of education: Not on file  . Highest education level: Not on file  Occupational History  . Occupation: retired Copywriter, advertising  Tobacco Use  . Smoking status: Never Smoker  . Smokeless tobacco: Never Used  Substance and Sexual Activity  . Alcohol use: Yes    Comment: Rare  . Drug use: No  . Sexual activity: Yes    Birth control/protection: Post-menopausal    Comment: 1st intercourse 63 yo-5 partners  Other Topics Concern  . Not on file  Social History Narrative   Lives at home with spouse   Right handed   Caffeine: not much, drinks decaf 2 cups/day  Social Determinants of Health   Financial Resource Strain:   . Difficulty of Paying Living Expenses: Not on file  Food Insecurity:   . Worried About Charity fundraiser in the Last Year: Not on file  . Ran Out of Food in the Last Year: Not on file  Transportation Needs:   . Lack of Transportation (Medical): Not on file  . Lack of Transportation (Non-Medical): Not on file  Physical Activity:   . Days of Exercise per Week: Not on file  . Minutes of Exercise per Session: Not on file  Stress:    . Feeling of Stress : Not on file  Social Connections:   . Frequency of Communication with Friends and Family: Not on file  . Frequency of Social Gatherings with Friends and Family: Not on file  . Attends Religious Services: Not on file  . Active Member of Clubs or Organizations: Not on file  . Attends Archivist Meetings: Not on file  . Marital Status: Not on file  Intimate Partner Violence:   . Fear of Current or Ex-Partner: Not on file  . Emotionally Abused: Not on file  . Physically Abused: Not on file  . Sexually Abused: Not on file    Family History  Problem Relation Age of Onset  . Hypertension Mother   . Other Mother        surgeries involving upper back  . Diabetes Father   . Heart failure Father   . Cancer Maternal Grandmother        Lung  . Allergic rhinitis Neg Hx   . Angioedema Neg Hx   . Asthma Neg Hx   . Atopy Neg Hx   . Eczema Neg Hx   . Immunodeficiency Neg Hx   . Urticaria Neg Hx     Past Medical History:  Diagnosis Date  . Arthritis    cervical and lumbar  . Depression   . GERD (gastroesophageal reflux disease)   . Hyperlipidemia   . IBS (irritable bowel syndrome)   . Prediabetes   . Vitamin D deficiency     Patient Active Problem List   Diagnosis Date Noted  . Upper airway cough syndrome 05/04/2019  . DOE (dyspnea on exertion) 05/03/2019  . 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 Osteoporosis 08/11/2017  . Abnormal glucose 06/25/2017  . 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 GERD (gastroesophageal reflux disease)   . Vitamin D deficiency   . Hyperlipidemia     Past Surgical History:  Procedure Laterality Date  . CARDIAC CATHETERIZATION  2011   normal (Dr. Terrence Dupont)  . RADIOACTIVE SEED GUIDED EXCISIONAL BREAST BIOPSY Right 03/11/2016   Procedure: RIGHT RADIOACTIVE SEED GUIDED EXCISIONAL BREAST BIOPSY;  Surgeon: Rolm Bookbinder, MD;  Location: [facility];  Service: General;  Laterality: Right;    Current Outpatient Medications    Medication Sig Dispense Refill  . aspirin 81 MG tablet Take 81 mg by mouth daily.    . cetirizine (ZYRTEC) 10 MG tablet Take 10 mg by mouth daily as needed for allergies.    . Cholecalciferol (D-3-5) 5000 UNITS capsule Take 5,000 Units by mouth daily.    Marland Kitchen ezetimibe (ZETIA) 10 MG tablet Take 1 tablet Daily for Cholesterol 90 tablet 3  . famotidine (PEPCID) 40 MG tablet Take 1 tablet by mouth once daily in the evening 30 tablet 5  . Flaxseed, Linseed, (FLAXSEED OIL PO) Take by mouth daily.    Marland Kitchen loratadine (CLARITIN) 10 MG tablet Take 10 mg by  mouth daily.    . Lutein 20 MG TABS Take by mouth.    . Multiple Vitamins-Minerals (WOMENS MULTI VITAMIN & MINERAL PO) Take 1 tablet by mouth daily.    . Omega-3 Fatty Acids (FISH OIL PO) Take by mouth daily.    Marland Kitchen omeprazole (PRILOSEC) 40 MG capsule Take 40 mg by mouth daily.    Marland Kitchen omeprazole (PRILOSEC) 40 MG capsule Take 1 capsule by mouth twice daily 60 capsule 5  . OVER THE COUNTER MEDICATION Patient takes MOM as needed.    Marland Kitchen OVER THE COUNTER MEDICATION     . RESTASIS 0.05 % ophthalmic emulsion instill 1 drop into both eyes twice a day  0   No current facility-administered medications for this visit.    Allergies as of 08/18/2019 - Review Complete 08/18/2019  Allergen Reaction Noted  . Bactrim [sulfamethoxazole-trimethoprim] Other (See Comments) 05/17/2013    Vitals: BP (!) 102/59 (BP Location: Left Arm, Patient Position: Sitting)   Pulse (!) 55   Temp (!) 97.4 F (36.3 C) Comment: taken at front  Ht 5' 4.75" (1.645 m)   Wt 141 lb (64 kg)   BMI 23.65 kg/m  Last Weight:  Wt Readings from Last 1 Encounters:  08/18/19 141 lb (64 kg)   Last Height:   Ht Readings from Last 1 Encounters:  08/18/19 5' 4.75" (1.645 m)     Physical exam: Exam: Gen: NAD, conversant, well nourised, well groomed                     CV: RRR, no MRG. No Carotid Bruits. No peripheral edema, warm, nontender Eyes: Conjunctivae clear without exudates or  hemorrhage  Neuro: Detailed Neurologic Exam  Speech:    Speech is normal; fluent and spontaneous with normal comprehension.  Cognition:    The patient is oriented to person, place, and time;     recent and remote memory intact;     language fluent;     normal attention, concentration,     fund of knowledge Cranial Nerves:    The pupils are equal, round, and reactive to light. The fundi flat. Visual fields are full to finger confrontation. Extraocular movements are intact. Trigeminal sensation is intact and the muscles of mastication are normal. The face is symmetric. The palate elevates in the midline. Hearing intact. Voice is normal. Shoulder shrug is normal. The tongue has normal motion without fasciculations.   Coordination:    Normal finger to nose  Gait:    Normal native gait  Motor Observation:    No asymmetry, no atrophy, and no involuntary movements noted. Tone:    Normal muscle tone.    Posture:    Posture is normal. normal erect    Strength:    Strength is V/V in the upper and lower limbs.      Sensation: intact to LT     Reflex Exam:  DTR's:    Deep tendon reflexes in the upper and lower extremities are normal bilaterally.   Toes:    The toes are downgoing bilaterally.   Clonus:    Clonus is absent.    Assessment/Plan: This is a patient with multiple complaints.  The left arm symptoms sound like either ulnar neuropathy or possibly cervical radiculopathy, we did discuss this, discussed conservative measures, discussed MRI of the cervical spine, EMG nerve conduction study.  The left leg symptoms appears to be peroneal compression after falling asleep in a chair, she is improving.  On the right  toe she reports no numbness or tingling but second digit hammertoe.  Schedule emg/ncs in 8 weeks on the left arm. If the left leg still symptomatic can also check that but that will likely resolve. The right hammertoe is likely orthopaedic as she does not have any  neuropathy.  Right at the end of the appointment as I was giving her AVS, Patient also asked about headaches, unfortunately she is not referred for this and I can;t address at this time. She had an unremarkable brain mri in 2014. She states the headaches are in themorning, she doesn't feel rested, dry mouth in th emorning, never feels refereshed I would discuss with pcp next week and consider sleep evaluation for OSA. I can't rule out other issues, for anything sisgnificant go to ED. If she comes back to me then I may have to send her to sleep so suggest sending directly there per pcp. Will send note to her pcp dr Melford Aase.     Orders Placed This Encounter  Procedures  . NCV with EMG(electromyography)   No orders of the defined types were placed in this encounter.   Cc: Unk Pinto, MD,  Unk Pinto, MD  Sarina Ill, MD  [facility] [address] [place], [place] [address]  Phone [phone] Fax [phone]

## 2019-08-25 ENCOUNTER — Other Ambulatory Visit: Payer: Self-pay

## 2019-08-25 ENCOUNTER — Telehealth: Payer: Self-pay | Admitting: Allergy and Immunology

## 2019-08-25 MED ORDER — LORATADINE 10 MG PO TABS: ORAL_TABLET | ORAL | 3 refills | Status: DC

## 2019-08-25 NOTE — Telephone Encounter (Signed)
Patient called and thought that Loratadine was going to be called into her pharmacy but it wasn't. Patient uses [facility] on [address]. Per Dr. Bruna Potter note:   2.  Treat and prevent inflammation:              A.  Loratadine 10 mg - 1 tablet 1-2 times per day  Please advise.

## 2019-08-25 NOTE — Telephone Encounter (Signed)
Medication sent to the pharmacy on file per Dr. Neldon Mc. Patient made aware.

## 2019-08-26 ENCOUNTER — Telehealth: Payer: Self-pay

## 2019-08-26 NOTE — Telephone Encounter (Signed)
Left message for patient to call the office.  Please inform [name] that Dr. Neldon Mc reviewed her nocturnal oximetry report and her O2 looks ok.  He will discuss things further during her return visit.  Will scan results into Epic.

## 2019-08-26 NOTE — Telephone Encounter (Signed)
Will notify patient if she calls. We will try an attempt again at a later time.

## 2019-08-28 DIAGNOSIS — F40298 Other specified phobia: Secondary | ICD-10-CM | POA: Insufficient documentation

## 2019-08-28 NOTE — Progress Notes (Signed)
History of Present Illness:       This very nice 69 y.o. MBF  presents for  6 month follow up with HTN, HLD, Pre-Diabetes and Vitamin D Deficiency.  Today patient expresses concerns re: pains in the Rt 2sd & rd toes occasionally awakening her at night.       Patient also is followed by Dr Carmelina Peal for chronic cough and is on  Omeprazole & Famotidine for GERD / LPR.  She also has IBS-C.  In addition, patient was seen recently by Dr Jaynee Eagles & is scheduled for PNCV / EMG to evaluate LLE  for suspected Ulnar neuropathy.   Patient has Osteoporosis with dexa BMD showed T-3.6 (Feb  2020)  and patient was reticent to start meds.      Patient is followed expectantly for labile HTN & BP has been controlled at home. Today's BP is at goal - 111/65.  Cardiac Cath was Negative / Normal in 2011. Patient has had no complaints of any cardiac type chest pain, palpitations, dyspnea / orthopnea / PND, dizziness, claudication, or dependent edema.      Hyperlipidemia not controlled as she is reticent to take Statins & with diet & Ezetamibe. Patient denies myalgias or other med SE's. Last Lipids were not at goal:  Lab Results  Component Value Date   CHOL 199 05/26/2019   HDL 72 05/26/2019   LDLCALC 113 (H) 05/26/2019   TRIG 57 05/26/2019   CHOLHDL 2.8 05/26/2019    Also, the patient has history of PreDiabetes(A1c 5.7% / 2014 and 6.1% / 2016)  and has had no symptoms of reactive hypoglycemia, diabetic polys, paresthesias or visual blurring.  Last A1c was near goal:  Lab Results  Component Value Date   HGBA1C 5.7 (H) 02/17/2019           Further, the patient also has history of Vitamin D Deficiency and supplements vitamin D without any suspected side-effects. Last vitamin D was near goal (70-100):  Lab Results  Component Value Date   VD25OH 59 02/17/2019    Current Outpatient Medications on File Prior to Visit  Medication Sig  . aspirin 81 MG tablet Take 81 mg by mouth daily.  . Cholecalciferol  (D-3-5) 5000 UNITS capsule Take 5,000 Units by mouth daily.  Marland Kitchen ezetimibe (ZETIA) 10 MG tablet Take 1 tablet Daily for Cholesterol  . famotidine (PEPCID) 40 MG tablet Take 1 tablet by mouth once daily in the evening  . loratadine (CLARITIN) 10 MG tablet Take 1 tablet 1-2 times daily  . Lutein 20 MG TABS Take by mouth.  . Multiple Vitamins-Minerals (AIR[place]ORNE) CHEW Chew 1 tablet by mouth as needed.  . Multiple Vitamins-Minerals (WOMENS MULTI VITAMIN & MINERAL PO) Take 1 tablet by mouth daily.  Marland Kitchen omeprazole (PRILOSEC) 40 MG capsule Take 1 capsule by mouth twice daily  . OVER THE COUNTER MEDICATION Patient takes MOM as needed.  Marland Kitchen OVER THE COUNTER MEDICATION   . RESTASIS 0.05 % ophthalmic emulsion instill 1 drop into both eyes twice a day   No current facility-administered medications on file prior to visit.    Allergies  Allergen Reactions  . Bactrim [Sulfamethoxazole-Trimethoprim] Other (See Comments)    Patient preference to take medication without sulfa    PMHx:   Past Medical History:  Diagnosis Date  . Arthritis    cervical and lumbar  . Depression   . GERD (gastroesophageal reflux disease)   . Hyperlipidemia   . IBS (irritable bowel  syndrome)   . Prediabetes   . Vitamin D deficiency     Immunization History  Administered Date(s) Administered  . Influenza, High Dose Seasonal PF 06/25/2017  . Influenza-Unspecified 03/24/2018, 03/29/2019  . PFIZER SARS-COV-2 Vaccination 07/08/2019, 07/29/2019  . PPD Test 12/12/2014, 12/26/2015, 01/15/2017  . Pneumococcal Conjugate-13 11/10/2018  . Td 05/18/2007  . Zoster 05/17/2006    Past Surgical History:  Procedure Laterality Date  . CARDIAC CATHETERIZATION  2011   normal (Dr. Terrence Dupont)  . RADIOACTIVE SEED GUIDED EXCISIONAL BREAST BIOPSY Right 03/11/2016   Procedure: RIGHT RADIOACTIVE SEED GUIDED EXCISIONAL BREAST BIOPSY;  Surgeon: Rolm Bookbinder, MD;  Location: [facility];  Service: General;  Laterality: Right;     FHx:    Reviewed / unchanged  SHx:    Reviewed / unchanged   Systems Review:  Constitutional: Denies fever, chills, wt changes, headaches, insomnia, fatigue, night sweats, change in appetite. Eyes: Denies redness, blurred vision, diplopia, discharge, itchy, watery eyes.  ENT: Denies discharge, congestion, post nasal drip, epistaxis, sore throat, earache, hearing loss, dental pain, tinnitus, vertigo, sinus pain, snoring.  CV: Denies chest pain, palpitations, irregular heartbeat, syncope, dyspnea, diaphoresis, orthopnea, PND, claudication or edema. Respiratory: denies cough, dyspnea, DOE, pleurisy, hoarseness, laryngitis, wheezing.  Gastrointestinal: Denies dysphagia, odynophagia, heartburn, reflux, water brash, abdominal pain or cramps, nausea, vomiting, bloating, diarrhea, constipation, hematemesis, melena, hematochezia  or hemorrhoids. Genitourinary: Denies dysuria, frequency, urgency, nocturia, hesitancy, discharge, hematuria or flank pain. Musculoskeletal: Denies arthralgias, myalgias, stiffness, jt. swelling, pain, limping or strain/sprain.  Skin: Denies pruritus, rash, hives, warts, acne, eczema or change in skin lesion(s). Neuro: No weakness, tremor, incoordination, spasms, paresthesia or pain. Psychiatric: Denies confusion, memory loss or sensory loss. Endo: Denies change in weight, skin or hair change.  Heme/Lymph: No excessive bleeding, bruising or enlarged lymph nodes.  Physical Exam  BP 111/65   P 56   T 97.4 F   R 16   Ht 5\' 5"   Wt 141 lb 6.4 oz   BMI 23.53   Postural              Sit BP 111/64    P 56          &          Stand BP 109/73     P 62   Appears  well nourished, well groomed  and in no distress.  Eyes: PERRLA, EOMs, conjunctiva no swelling or erythema. Sinuses: No frontal/maxillary tenderness ENT/Mouth: EAC's clear, TM's nl w/o erythema, bulging. Nares clear w/o erythema, swelling, exudates. Oropharynx clear without erythema or exudates. Oral hygiene  is good. Tongue normal, non obstructing. Hearing intact.  Neck: Supple. Thyroid not palpable. Car 2+/2+ without bruits, nodes or JVD. Chest: Respirations nl with BS clear & equal w/o rales, rhonchi, wheezing or stridor.  Cor: Heart sounds normal w/ regular rate and rhythm without sig. murmurs, gallops, clicks or rubs. Peripheral pulses normal and equal  without edema.  Abdomen: Soft & bowel sounds normal. Non-tender w/o guarding, rebound, hernias, masses or organomegaly.  Lymphatics: Unremarkable.  Musculoskeletal: Full ROM all peripheral extremities, joint stability, 5/5 strength and normal gait.  Skin: Warm, dry without exposed rashes, lesions or ecchymosis apparent.  Neuro: Cranial nerves intact, reflexes equal bilaterally. Sensory-motor testing grossly intact. Tendon reflexes grossly intact.  Pysch: Alert & oriented x 3.  Insight and judgement nl & appropriate. No ideations.  Assessment and Plan:  1. Labile hypertension  - Continue monitor blood pressure at home.  - Continue DASH  diet.  Reminder to go to the ER if any CP,  SOB, nausea, dizziness, severe HA, changes vision/speech.  - CBC with Differential/Platelet - COMPLETE METABOLIC PANEL WITH GFR - Magnesium - TSH  2. Hyperlipidemia, mixed  - Continue diet/meds, exercise,& lifestyle modifications.  - Continue monitor periodic cholesterol/liver & renal functions   - Lipid panel - TSH  3. Abnormal glucose  - Continue diet, exercise  - Lifestyle modifications.  - Monitor appropriate labs.  - Hemoglobin A1c - Insulin, random  4. Vitamin D deficiency  - Continue supplementation.  - VITAMIN D 25 Hydroxy   5. Medication management  - CBC with Differential/Platelet - COMPLETE METABOLIC PANEL WITH GFR - Magnesium - Lipid panel - TSH - Hemoglobin A1c - Insulin, random - VITAMIN D 25 Hydroxy        Discussed  regular exercise, BP monitoring, weight control to achieve/maintain BMI less than 25 and discussed med and  SE's. Recommended labs to assess and monitor clinical status with further disposition pending results of labs.  I discussed the assessment and treatment plan with the patient. The patient was provided an opportunity to ask questions and all were answered. The patient agreed with the plan and demonstrated an understanding of the instructions.  I provided over 30 minutes of exam, counseling, chart review and  complex critical decision making.   Kirtland Bouchard, MD

## 2019-08-28 NOTE — Patient Instructions (Signed)

## 2019-08-29 ENCOUNTER — Other Ambulatory Visit: Payer: Self-pay

## 2019-08-29 ENCOUNTER — Encounter: Payer: Self-pay | Admitting: Internal Medicine

## 2019-08-29 ENCOUNTER — Ambulatory Visit: Payer: Medicare Other | Admitting: Internal Medicine

## 2019-08-29 VITALS — BP 111/65 | HR 56 | Temp 97.4°F | Resp 16 | Ht 65.0 in | Wt 141.4 lb

## 2019-08-29 DIAGNOSIS — E559 Vitamin D deficiency, unspecified: Secondary | ICD-10-CM

## 2019-08-29 DIAGNOSIS — R7309 Other abnormal glucose: Secondary | ICD-10-CM

## 2019-08-29 DIAGNOSIS — M79671 Pain in right foot: Secondary | ICD-10-CM

## 2019-08-29 DIAGNOSIS — E782 Mixed hyperlipidemia: Secondary | ICD-10-CM

## 2019-08-29 DIAGNOSIS — F40298 Other specified phobia: Secondary | ICD-10-CM

## 2019-08-29 DIAGNOSIS — R0989 Other specified symptoms and signs involving the circulatory and respiratory systems: Secondary | ICD-10-CM | POA: Diagnosis not present

## 2019-08-29 DIAGNOSIS — Z79899 Other long term (current) drug therapy: Secondary | ICD-10-CM

## 2019-08-29 NOTE — Telephone Encounter (Signed)
Patient informed. 

## 2019-08-30 LAB — CBC WITH DIFFERENTIAL/PLATELET
Absolute Monocytes: 247 cells/uL (ref 200–950)
Basophils Absolute: 39 cells/uL (ref 0–200)
Basophils Relative: 1.5 %
Eosinophils Absolute: 88 cells/uL (ref 15–500)
Eosinophils Relative: 3.4 %
HCT: 38.6 % (ref 35.0–45.0)
Hemoglobin: 12.9 g/dL (ref 11.7–15.5)
Lymphs Abs: 1443 cells/uL (ref 850–3900)
MCH: 28.9 pg (ref 27.0–33.0)
MCHC: 33.4 g/dL (ref 32.0–36.0)
MCV: 86.4 fL (ref 80.0–100.0)
MPV: 11.3 fL (ref 7.5–12.5)
Monocytes Relative: 9.5 %
Neutro Abs: 783 cells/uL — ABNORMAL LOW (ref 1500–7800)
Neutrophils Relative %: 30.1 %
Platelets: 175 10*3/uL (ref 140–400)
RBC: 4.47 10*6/uL (ref 3.80–5.10)
RDW: 12.5 % (ref 11.0–15.0)
Total Lymphocyte: 55.5 %
WBC: 2.6 10*3/uL — ABNORMAL LOW (ref 3.8–10.8)

## 2019-08-30 LAB — LIPID PANEL
Cholesterol: 197 mg/dL (ref <200)
HDL: 75 mg/dL (ref >=50)
LDL Cholesterol (Calc): 109 mg/dL (calc) — ABNORMAL HIGH
Non-HDL Cholesterol (Calc): 122 mg/dL (calc) (ref <130)
Total CHOL/HDL Ratio: 2.6 (calc) (ref <5.0)
Triglycerides: 51 mg/dL (ref <150)

## 2019-08-30 LAB — COMPLETE METABOLIC PANEL WITH GFR
AG Ratio: 1.5 (calc) (ref 1.0–2.5)
ALT: 15 U/L (ref 6–29)
AST: 18 U/L (ref 10–35)
Albumin: 4.1 g/dL (ref 3.6–5.1)
Alkaline phosphatase (APISO): 86 U/L (ref 37–153)
BUN: 11 mg/dL (ref 7–25)
CO2: 31 mmol/L (ref 20–32)
Calcium: 9.6 mg/dL (ref 8.6–10.4)
Chloride: 102 mmol/L (ref 98–110)
Creat: 0.83 mg/dL (ref 0.50–0.99)
GFR, Est African American: 85 mL/min/{1.73_m2} (ref >=60)
GFR, Est Non African American: 73 mL/min/{1.73_m2} (ref >=60)
Globulin: 2.7 g/dL (calc) (ref 1.9–3.7)
Glucose, Bld: 85 mg/dL (ref 65–99)
Potassium: 4.3 mmol/L (ref 3.5–5.3)
Sodium: 138 mmol/L (ref 135–146)
Total Bilirubin: 0.5 mg/dL (ref 0.2–1.2)
Total Protein: 6.8 g/dL (ref 6.1–8.1)

## 2019-08-30 LAB — TSH: TSH: 1.34 mIU/L (ref 0.40–4.50)

## 2019-08-30 LAB — MAGNESIUM: Magnesium: 2.1 mg/dL (ref 1.5–2.5)

## 2019-08-30 LAB — HEMOGLOBIN A1C
Hgb A1c MFr Bld: 5.7 % of total Hgb — ABNORMAL HIGH (ref <5.7)
Mean Plasma Glucose: 117 (calc)
eAG (mmol/L): 6.5 (calc)

## 2019-08-30 LAB — VITAMIN D 25 HYDROXY (VIT D DEFICIENCY, FRACTURES): Vit D, 25-Hydroxy: 71 ng/mL (ref 30–100)

## 2019-08-30 LAB — INSULIN, RANDOM: Insulin: 3.6 u[IU]/mL

## 2019-09-02 ENCOUNTER — Encounter: Payer: Self-pay | Admitting: *Deleted

## 2019-09-02 DIAGNOSIS — K581 Irritable bowel syndrome with constipation: Secondary | ICD-10-CM | POA: Insufficient documentation

## 2019-09-13 ENCOUNTER — Other Ambulatory Visit: Payer: Self-pay

## 2019-09-13 ENCOUNTER — Telehealth: Payer: Self-pay | Admitting: *Deleted

## 2019-09-13 ENCOUNTER — Encounter: Payer: Self-pay | Admitting: Allergy and Immunology

## 2019-09-13 ENCOUNTER — Ambulatory Visit: Payer: Medicare Other | Admitting: Allergy and Immunology

## 2019-09-13 VITALS — BP 98/60 | HR 57 | Temp 97.8°F | Resp 16 | Ht 64.75 in | Wt 142.4 lb

## 2019-09-13 DIAGNOSIS — J3089 Other allergic rhinitis: Secondary | ICD-10-CM | POA: Diagnosis not present

## 2019-09-13 DIAGNOSIS — K219 Gastro-esophageal reflux disease without esophagitis: Secondary | ICD-10-CM

## 2019-09-13 DIAGNOSIS — G4720 Circadian rhythm sleep disorder, unspecified type: Secondary | ICD-10-CM | POA: Diagnosis not present

## 2019-09-13 DIAGNOSIS — G43909 Migraine, unspecified, not intractable, without status migrainosus: Secondary | ICD-10-CM | POA: Diagnosis not present

## 2019-09-13 MED ORDER — IPRATROPIUM BROMIDE 0.06 % NA SOLN: NASAL | 5 refills | Status: DC

## 2019-09-13 MED ORDER — CYPROHEPTADINE HCL 4 MG PO TABS: ORAL_TABLET | ORAL | 5 refills | Status: DC

## 2019-09-13 NOTE — Progress Notes (Signed)
[facility]   Follow-up Note  Referring Provider: Unk Pinto, MD Primary Provider: Unk Pinto, MD Date of Office Visit: 09/13/2019  Subjective:   [name] (DOB: December 31, 1950) is a 69 y.o. female who returns to the [facility] on 09/13/2019 in re-evaluation of the following:  HPI: [name] returns to this clinic in evaluation of allergic rhinitis and LPR and migraine syndrome and sleep dysfunction.  Her last visit to this clinic was 16 August 2019.  Her airway is doing better.  Her cough continues to improve.  Her postnasal drip and raspy voice continues to improve.  She still has clear rhinorrhea.  Her headaches have not improved.  They are basically the same intensity and frequency.  Her sleep dysfunction has not improved.  She still has fractured sleep and very light sleep.  She is not consuming any caffeine.  Allergies as of 09/13/2019      Reactions   Bactrim [sulfamethoxazole-trimethoprim] Other (See Comments)   Patient preference to take medication without sulfa      Medication List    aspirin 81 MG tablet Take 81 mg by mouth daily.   D-3-5 125 MCG (5000 UT) capsule Generic drug: Cholecalciferol Take 5,000 Units by mouth daily.   ezetimibe 10 MG tablet Commonly known as: ZETIA Take 1 tablet Daily for Cholesterol   famotidine 40 MG tablet Commonly known as: PEPCID Take 1 tablet by mouth once daily in the evening   hyoscyamine 0.375 MG 12 hr tablet Commonly known as: LEVBID Take by mouth.   loratadine 10 MG tablet Commonly known as: CLARITIN Take 1 tablet 1-2 times daily   Lutein 20 MG Tabs Take by mouth.   NASACORT AQ NA Place into the nose.   omeprazole 40 MG capsule Commonly known as: PRILOSEC Take 1 capsule by mouth twice daily   [place] Patient takes MOM as needed.   Restasis 0.05 % ophthalmic emulsion Generic drug: cycloSPORINE instill 1 drop into  both eyes twice a day   WOMENS MULTI VITAMIN & MINERAL PO Take 1 tablet by mouth daily.   Airborne Google 1 tablet by mouth as needed.       Past Medical History:  Diagnosis Date  . Arthritis    cervical and lumbar  . Depression   . GERD (gastroesophageal reflux disease)   . Hyperlipidemia   . IBS (irritable bowel syndrome)   . Prediabetes   . Vitamin D deficiency     Past Surgical History:  Procedure Laterality Date  . CARDIAC CATHETERIZATION  2011   normal (Dr. Terrence Dupont)  . RADIOACTIVE SEED GUIDED EXCISIONAL BREAST BIOPSY Right 03/11/2016   Procedure: RIGHT RADIOACTIVE SEED GUIDED EXCISIONAL BREAST BIOPSY;  Surgeon: Rolm Bookbinder, MD;  Location: [facility];  Service: General;  Laterality: Right;    Review of systems negative except as noted in HPI / PMHx or noted below:  Review of Systems  Constitutional: Negative.   HENT: Negative.   Eyes: Negative.   Respiratory: Negative.   Cardiovascular: Negative.   Gastrointestinal: Negative.   Genitourinary: Negative.   Musculoskeletal: Negative.   Skin: Negative.   Neurological: Negative.   Endo/Heme/Allergies: Negative.   Psychiatric/Behavioral: Negative.      Objective:   Vitals:   09/13/19 0922  BP: 98/60  Pulse: (!) 57  Resp: 16  Temp: 97.8 F (36.6 C)  SpO2: 99%   Height: 5' 4.75" (164.5 cm)  Weight:  142 lb 6.4 oz (64.6 kg)   Physical Exam Constitutional:      Appearance: She is not diaphoretic.  HENT:     Head: Normocephalic.     Right Ear: Tympanic membrane, ear canal and external ear normal.     Left Ear: Tympanic membrane, ear canal and external ear normal.     Nose: Nose normal. No mucosal edema or rhinorrhea.     Mouth/Throat:     Pharynx: Uvula midline. No oropharyngeal exudate.  Eyes:     Conjunctiva/sclera: Conjunctivae normal.  Neck:     Thyroid: No thyromegaly.     Trachea: Trachea normal. No tracheal tenderness or tracheal deviation.  Cardiovascular:     Rate  and Rhythm: Normal rate and regular rhythm.     Heart sounds: Normal heart sounds, S1 normal and S2 normal. No murmur.  Pulmonary:     Effort: No respiratory distress.     Breath sounds: Normal breath sounds. No stridor. No wheezing or rales.  Lymphadenopathy:     Head:     Right side of head: No tonsillar adenopathy.     Left side of head: No tonsillar adenopathy.     Cervical: No cervical adenopathy.  Skin:    Findings: No erythema or rash.     Nails: There is no clubbing.  Neurological:     Mental Status: She is alert.     Diagnostics:   Results of a nocturnal oximetry study obtained on 22 August 2019 did not identify any sleep time with an oxygen saturation below 88%.  Assessment and Plan:   1. Perennial allergic rhinitis   2. LPRD (laryngopharyngeal reflux disease)   3. Migraine syndrome   4. 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  Allergen avoidance measures - dust mite, pollens, molds, cat  2.  Treat and prevent inflammation:   A.  Loratadine 10 mg - 1 tablet 1-2 times per day  3.  Continue to Treat and prevent reflux:   A.  omeprazole to 40 mg twice a day   C.  famotidine 40 mg in evening  4. Treat headache and sleep dysfunction:   A. Periactin 4mg  - 1/2 - 1 tablet at bedtime  5. If needed:   A. Nasal ipratropium 0.06% - 1-2 sprays each nostril every 6 hours to dry nose.  6. Consider a course of immunotherapy  7. Return to clinic in 8 weeks or earlier if problem. Taper reflux meds?  [name] is doing better with her airway other than the fact that she does have rhinorrhea and we will give her nasal ipratropium to deal with that issue.  She is not doing better regarding her headaches and sleep dysfunction and we will try her on Periactin at relatively low dose starting with 2 mg at bedtime.  She can escalate to 4 mg total at bedtime should 2 mg be inefficient in taking care of these issues.  I will see her back in his clinic in 8 weeks or earlier  if there is a problem.  I suspect there will be an opportunity to taper some of her reflux medicines at that point.  Allena Katz, MD [facility]

## 2019-09-13 NOTE — Telephone Encounter (Signed)
PA has been submitted through CoverMyMeds for Periactin and is currently pending approval or denied. If denied patient will need to try and fail Timolol before Periactin will be approved.

## 2019-09-13 NOTE — Patient Instructions (Signed)
  1.  Allergen avoidance measures - dust mite, pollens, molds, cat  2.  Treat and prevent inflammation:   A.  Loratadine 10 mg - 1 tablet 1-2 times per day  3.  Continue to Treat and prevent reflux:   A.  omeprazole to 40 mg twice a day   C.  famotidine 40 mg in evening  4. Treat headache and sleep dysfunction:   A. Periactin 4mg  - 1/2 - 1 tablet at bedtime  5. If needed:   A. Nasal ipratropium 0.06% - 1-2 sprays each nostril every 6 hours to dry nose.  6. Consider a course of immunotherapy  7. Return to clinic in 8 weeks or earlier if problem. Taper reflux meds?

## 2019-09-14 ENCOUNTER — Encounter: Payer: Self-pay | Admitting: Allergy and Immunology

## 2019-09-14 NOTE — Telephone Encounter (Signed)
This does not make sense.  Timolol is an eyedrop.  Periactin is a tablet.  Please confirm with insurance company that patient must fail timolol before Periactin.

## 2019-09-14 NOTE — Telephone Encounter (Signed)
Received fax stating that Periactin was denied stating that patient has to try and fail Timolol first. Please advise change in medication.

## 2019-09-14 NOTE — Telephone Encounter (Signed)
I'm thinking that it is wanting Timolol as an alternative because it can treat migraines and that it was requesting was that the purpose of the Periactin. Will re-attempt PA tomorrow and do some more research. Thank You.

## 2019-09-15 ENCOUNTER — Ambulatory Visit (INDEPENDENT_AMBULATORY_CARE_PROVIDER_SITE_OTHER): Payer: Medicare Other

## 2019-09-15 ENCOUNTER — Ambulatory Visit: Payer: Medicare Other | Admitting: Podiatry

## 2019-09-15 ENCOUNTER — Other Ambulatory Visit: Payer: Self-pay

## 2019-09-15 DIAGNOSIS — M21619 Bunion of unspecified foot: Secondary | ICD-10-CM

## 2019-09-15 DIAGNOSIS — M2041 Other hammer toe(s) (acquired), right foot: Secondary | ICD-10-CM | POA: Diagnosis not present

## 2019-09-15 DIAGNOSIS — B351 Tinea unguium: Secondary | ICD-10-CM | POA: Diagnosis not present

## 2019-09-15 DIAGNOSIS — M21611 Bunion of right foot: Secondary | ICD-10-CM | POA: Diagnosis not present

## 2019-09-15 DIAGNOSIS — M779 Enthesopathy, unspecified: Secondary | ICD-10-CM | POA: Diagnosis not present

## 2019-09-15 DIAGNOSIS — M2042 Other hammer toe(s) (acquired), left foot: Secondary | ICD-10-CM

## 2019-09-15 NOTE — Telephone Encounter (Signed)
Lets find out the cash price for Periactin.  It is an old generic and should be very cheap.

## 2019-09-15 NOTE — Patient Instructions (Signed)
Soak Instructions    THE DAY AFTER THE PROCEDURE  Place 1/4 cup of epsom salts in a quart of warm tap water.  Submerge your foot or feet with outer bandage intact for the initial soak; this will allow the bandage to become moist and wet for easy lift off.  Once you remove your bandage, continue to soak in the solution for 20 minutes.  This soak should be done twice a day.  Next, remove your foot or feet from solution, blot dry the affected area and cover.  You may use a band aid large enough to cover the area or use gauze and tape.  Apply other medications to the area as directed by the doctor such as polysporin neosporin.  IF YOUR SKIN BECOMES IRRITATED WHILE USING THESE INSTRUCTIONS, IT IS OKAY TO SWITCH TO  WHITE VINEGAR AND WATER. Or you may use antibacterial soap and water to keep the toe clean  Monitor for any signs/symptoms of infection. Call the office immediately if any occur or go directly to the emergency room. Call with any questions/concerns.   Hammer Toe  Hammer toe is a change in the shape (a deformity) of your toe. The deformity causes the middle joint of your toe to stay bent. This causes pain, especially when you are wearing shoes. Hammer toe starts gradually. At first, the toe can be straightened. Gradually over time, the deformity becomes stiff and permanent. Early treatments to keep the toe straight may relieve pain. As the deformity becomes stiff and permanent, surgery may be needed to straighten the toe. What are the causes? Hammer toe is caused by abnormal bending of the toe joint that is closest to your foot. It happens gradually over time. This pulls on the muscles and connections (tendons) of the toe joint, making them weak and stiff. It is often related to wearing shoes that are too short or narrow and do not let your toes straighten. What increases the risk? You may be at greater risk for hammer toe if you:  Are female.  Are older.  Wear shoes that are too  small.  Wear high-heeled shoes that pinch your toes.  Are a Engineer, mining.  Have a second toe that is longer than your big toe (first toe).  Injure your foot or toe.  Have arthritis.  Have a family history of hammer toe.  Have a nerve or muscle disorder. What are the signs or symptoms? The main symptoms of this condition are pain and deformity of the toe. The pain is worse when wearing shoes, walking, or running. Other symptoms may include:  Corns or calluses over the bent part of the toe or between the toes.  Redness and a burning feeling on the toe.  An open sore that forms on the top of the toe.  Not being able to straighten the toe. How is this diagnosed? This condition is diagnosed based on your symptoms and a physical exam. During the exam, your health care provider will try to straighten your toe to see how stiff the deformity is. You may also have tests, such as:  A blood test to check for rheumatoid arthritis.  An X-ray to show how severe the deformity is. How is this treated? Treatment for this condition will depend on how stiff the deformity is. Surgery is often needed. However, sometimes a hammer toe can be straightened without surgery. Treatments that do not involve surgery include:  Taping the toe into a straightened position.  Using pads and  cushions to protect the toe (orthotics).  Wearing shoes that provide enough room for the toes.  Doing toe-stretching exercises at home.  Taking an NSAID to reduce pain and swelling. If these treatments do not help or the toe cannot be straightened, surgery is the next option. The most common surgeries used to straighten a hammer toe include:  Arthroplasty. In this procedure, part of the joint is removed, and that allows the toe to straighten.  Fusion. In this procedure, cartilage between the two bones of the joint is taken out and the bones are fused together into one longer bone.  Implantation. In this procedure,  part of the bone is removed and replaced with an implant to let the toe move again.  Flexor tendon transfer. In this procedure, the tendons that curl the toes down (flexor tendons) are repositioned. Follow these instructions at home:  Take over-the-counter and prescription medicines only as told by your health care provider.  Do toe straightening and stretching exercises as told by your health care provider.  Keep all follow-up visits as told by your health care provider. This is important. How is this prevented?  Wear shoes that give your toes enough room and do not cause pain.  Do not wear high-heeled shoes. Contact a health care provider if:  Your pain gets worse.  Your toe becomes red or swollen.  You develop an open sore on your toe. This information is not intended to replace advice given to you by your health care provider. Make sure you discuss any questions you have with your health care provider. Document Revised: 05/15/2017 Document Reviewed: 09/26/2015 Elsevier Patient Education  [place]  A bunion is a bump on the base of the big toe that forms when the bones of the big toe joint move out of position. Bunions may be small at first, but they often get larger over time. They can make walking painful. What are the causes? A bunion may be caused by:  Wearing narrow or pointed shoes that force the big toe to press against the other toes.  Abnormal foot development that causes the foot to roll inward (pronate).  Changes in the foot that are caused by certain diseases, such as rheumatoid arthritis or polio.  A foot injury. What increases the risk? The following factors may make you more likely to develop this condition:  Wearing shoes that squeeze the toes together.  Having certain diseases, such as: ? Rheumatoid arthritis. ? Polio. ? Cerebral palsy.  Having family members who have bunions.  Being born with a foot deformity, such as flat  feet or low arches.  Doing activities that put a lot of pressure on the feet, such as ballet dancing. What are the signs or symptoms? The main symptom of a bunion is a noticeable bump on the big toe. Other symptoms may include:  Pain.  Swelling around the big toe.  Redness and inflammation.  Thick or hardened skin on the big toe or between the toes.  Stiffness or loss of motion in the big toe.  Trouble with walking. How is this diagnosed? A bunion may be diagnosed based on your symptoms, medical history, and activities. You may have tests, such as:  X-rays. These allow your health care provider to check the position of the bones in your foot and look for damage to your joint. They also help your health care provider determine the severity of your bunion and the best way to treat  it.  Joint aspiration. In this test, a sample of fluid is removed from the toe joint. This test may be done if you are in a lot of pain. It helps rule out diseases that cause painful swelling of the joints, such as arthritis. How is this treated? Treatment depends on the severity of your symptoms. The goal of treatment is to relieve symptoms and prevent the bunion from getting worse. Your health care provider may recommend:  Wearing shoes that have a wide toe box.  Using bunion pads to cushion the affected area.  Taping your toes together to keep them in a normal position.  Placing a device inside your shoe (orthotics) to help reduce pressure on your toe joint.  Taking medicine to ease pain, inflammation, and swelling.  Applying heat or ice to the affected area.  Doing stretching exercises.  Surgery to remove scar tissue and move the toes back into their normal position. This treatment is rare. Follow these instructions at home: Managing pain, stiffness, and swelling   If directed, put ice on the painful area: ? Put ice in a plastic bag. ? Place a towel between your skin and the bag. ? Leave  the ice on for 20 minutes, 2-3 times a day. Activity   If directed, apply heat to the affected area before you exercise. Use the heat source that your health care provider recommends, such as a moist heat pack or a heating pad. ? Place a towel between your skin and the heat source. ? Leave the heat on for 20-30 minutes. ? Remove the heat if your skin turns bright red. This is especially important if you are unable to feel pain, heat, or cold. You may have a greater risk of getting burned.  Do exercises as told by your health care provider. General instructions  Support your toe joint with proper footwear, shoe padding, or taping as told by your health care provider.  Take over-the-counter and prescription medicines only as told by your health care provider.  Keep all follow-up visits as told by your health care provider. This is important. Contact a health care provider if your symptoms:  Get worse.  Do not improve in 2 weeks. Get help right away if you have:  Severe pain and trouble with walking. Summary  A bunion is a bump on the base of the big toe that forms when the bones of the big toe joint move out of position.  Bunions can make walking painful.  Treatment depends on the severity of your symptoms.  Support your toe joint with proper footwear, shoe padding, or taping as told by your health care provider. This information is not intended to replace advice given to you by your health care provider. Make sure you discuss any questions you have with your health care provider. Document Revised: 12/07/2017 Document Reviewed: 10/13/2017 Elsevier Patient Education  [place].

## 2019-09-15 NOTE — Telephone Encounter (Signed)
Can someone please assist in looking into this for me? Thank You.

## 2019-09-15 NOTE — Telephone Encounter (Signed)
Call to pharmacy, prescription is $28.99 without insurance.  Call to patient, she is willing to pay for the prescription.  Call back to pharmacy, to make them aware and to fill the prescription without the insurance.

## 2019-09-20 NOTE — Progress Notes (Signed)
Subjective:   Patient ID: [name], female   DOB: 69 y.o.   MRN: [id number]   HPI 69 year old female presents the office today for concerns of pain to her right second and third toe possible hammertoe on the second digit.  This is been ongoing for about 1 year she describes as sharp pain at times.  Also she is getting pain in the ball of her foot which is been ongoing for last 1 year.  She describes a dull ache.  She says it hurts with walking during but also afterwards.  She is also concerned about her nails and possible fungus.  No pain of the nails himself no redness or drainage.  Review of Systems  All other systems reviewed and are negative.  Past Medical History:  Diagnosis Date  . Arthritis    cervical and lumbar  . Depression   . GERD (gastroesophageal reflux disease)   . Hyperlipidemia   . IBS (irritable bowel syndrome)   . Prediabetes   . Vitamin D deficiency     Past Surgical History:  Procedure Laterality Date  . CARDIAC CATHETERIZATION  2011   normal (Dr. Terrence Dupont)  . RADIOACTIVE SEED GUIDED EXCISIONAL BREAST BIOPSY Right 03/11/2016   Procedure: RIGHT RADIOACTIVE SEED GUIDED EXCISIONAL BREAST BIOPSY;  Surgeon: Rolm Bookbinder, MD;  Location: [facility];  Service: General;  Laterality: Right;     Current Outpatient Medications:  .  aspirin 81 MG tablet, Take 81 mg by mouth daily., Disp: , Rfl:  .  Cholecalciferol (D-3-5) 5000 UNITS capsule, Take 5,000 Units by mouth daily., Disp: , Rfl:  .  cyproheptadine (PERIACTIN) 4 MG tablet, 1/2-1 tablet at bedtime, Disp: 30 tablet, Rfl: 5 .  ezetimibe (ZETIA) 10 MG tablet, Take 1 tablet Daily for Cholesterol, Disp: 90 tablet, Rfl: 3 .  famotidine (PEPCID) 40 MG tablet, Take 1 tablet by mouth once daily in the evening, Disp: 30 tablet, Rfl: 5 .  hyoscyamine (LEVBID) 0.375 MG 12 hr tablet, Take by mouth., Disp: , Rfl:  .  ipratropium (ATROVENT) 0.06 % nasal spray, 1-2 sprays each nostril every 6 hours to dry  up your nose., Disp: 15 mL, Rfl: 5 .  loratadine (CLARITIN) 10 MG tablet, Take 1 tablet 1-2 times daily, Disp: 60 tablet, Rfl: 3 .  Lutein 20 MG TABS, Take by mouth., Disp: , Rfl:  .  Multiple Vitamins-Minerals (A[place]) CHEW, Chew 1 tablet by mouth as needed., Disp: , Rfl:  .  Multiple Vitamins-Minerals (WOMENS MULTI VITAMIN & MINERAL PO), Take 1 tablet by mouth daily., Disp: , Rfl:  .  omeprazole (PRILOSEC) 40 MG capsule, Take 1 capsule by mouth twice daily, Disp: 60 capsule, Rfl: 5 .  OVER THE COUNTER MEDICATION, Patient takes MOM as needed., Disp: , Rfl:  .  RESTASIS 0.05 % ophthalmic emulsion, instill 1 drop into both eyes twice a day, Disp: , Rfl: 0 .  Triamcinolone Acetonide (NASACORT AQ NA), Place into the nose., Disp: , Rfl:   Allergies  Allergen Reactions  . Bactrim [Sulfamethoxazole-Trimethoprim] Other (See Comments)    Patient preference to take medication without sulfa        Objective:  Physical Exam  General: AAO x3, NAD  Dermatological: Nails are mildly hypertrophic, dystrophic with yellow-brown discoloration.  There is no edema, erythema, drainage or pus or any signs of infection.  There is no open lesions.  Vascular: Dorsalis Pedis artery and Posterior Tibial artery pedal pulses are 2/4 bilateral with immedate capillary fill time.  There is no pain with calf compression, swelling, warmth, erythema.   Neruologic: Grossly intact via light touch bilateral.   Musculoskeletal: Hammertoe contractures present to the second third digits as well as bunion deformity.  There is submetatarsal 2 discomfort.  There is no other areas of tenderness and there is no area of pinpoint tenderness.  There is no significant edema, erythema.  Muscular strength 5/5 in all groups tested bilateral.  Gait: Unassisted, Nonantalgic.       Assessment:   Hammertoe deformity with bunion deformity; capsulitis; onychomycosis    Plan:  -Treatment options discussed including all alternatives,  risks, and complications -Etiology of symptoms were discussed -X-rays were obtained and reviewed with the patient.  Bony deformities hammertoe to contractures are present.  Elongated second metatarsal. -Dispensed offloading pads for the hammertoes as well as for submetatarsal 2 pain.  She is already wearing good shoes.  Discussed inserts to inside of her shoes.  Also consider surgical intervention if needed in the future. -In regard to the nails we discussed nail fungus options.  Discussed oral Lamisil.  I gave her information she is going to think about starting medication.  Return in about 6 weeks (around 10/27/2019).  Trula Slade DPM

## 2019-10-06 ENCOUNTER — Encounter: Payer: Medicare Other | Admitting: Neurology

## 2019-10-27 ENCOUNTER — Ambulatory Visit: Payer: Medicare Other | Admitting: Podiatry

## 2019-10-27 ENCOUNTER — Encounter: Payer: Self-pay | Admitting: Podiatry

## 2019-10-27 ENCOUNTER — Other Ambulatory Visit: Payer: Self-pay

## 2019-10-27 DIAGNOSIS — M2042 Other hammer toe(s) (acquired), left foot: Secondary | ICD-10-CM

## 2019-10-27 DIAGNOSIS — M779 Enthesopathy, unspecified: Secondary | ICD-10-CM

## 2019-10-27 DIAGNOSIS — B351 Tinea unguium: Secondary | ICD-10-CM | POA: Diagnosis not present

## 2019-10-27 DIAGNOSIS — M21619 Bunion of unspecified foot: Secondary | ICD-10-CM

## 2019-10-27 DIAGNOSIS — M21961 Unspecified acquired deformity of right lower leg: Secondary | ICD-10-CM

## 2019-10-27 DIAGNOSIS — M2041 Other hammer toe(s) (acquired), right foot: Secondary | ICD-10-CM | POA: Diagnosis not present

## 2019-10-27 NOTE — Patient Instructions (Signed)
Terbinafine oral granules What is this medicine? TERBINAFINE (TER bin a feen) is an antifungal medicine. It is used to treat certain kinds of fungal or yeast infections. This medicine may be used for other purposes; ask your health care provider or pharmacist if you have questions. COMMON BRAND NAME(S): Lamisil What should I tell my health care provider before I take this medicine? They need to know if you have any of these conditions:  drink alcoholic beverages  kidney disease  liver disease  an unusual or allergic reaction to Terbinafine, other medicines, foods, dyes, or preservatives  pregnant or trying to get pregnant  breast-feeding How should I use this medicine? Take this medicine by mouth. Follow the directions on the prescription label. Hold packet with cut line on top. Shake packet gently to settle contents. Tear packet open along cut line, or use scissors to cut across line. Carefully pour the entire contents of packet onto a spoonful of a soft food, such as pudding or other soft, non-acidic food such as mashed potatoes (do NOT use applesauce or a fruit-based food). If two packets are required for each dose, you may either sprinkle the content of both packets on one spoonful of non-acidic food, or sprinkle the contents of both packets on two spoonfuls of non-acidic food. Make sure that no granules remain in the packet. Swallow the mxiture of the food and granules without chewing. Take your medicine at regular intervals. Do not take it more often than directed. Take all of your medicine as directed even if you think you are better. Do not skip doses or stop your medicine early. Contact your pediatrician or health care professional regarding the use of this medicine in children. While this medicine may be prescribed for children as young as 4 years for selected conditions, precautions do apply. Overdosage: If you think you have taken too much of this medicine contact a poison control  center or emergency room at once. NOTE: This medicine is only for you. Do not share this medicine with others. What if I miss a dose? If you miss a dose, take it as soon as you can. If it is almost time for your next dose, take only that dose. Do not take double or extra doses. What may interact with this medicine? Do not take this medicine with any of the following medications:  thioridazine This medicine may also interact with the following medications:  beta-blockers  caffeine  cimetidine  cyclosporine  MAOIs like Carbex, Eldepryl, Marplan, Nardil, and Parnate  medicines for fungal infections like fluconazole and ketoconazole  medicines for irregular heartbeat like amiodarone, flecainide and propafenone  rifampin  SSRIs like citalopram, escitalopram, fluoxetine, fluvoxamine, paroxetine and sertraline  tricyclic antidepressants like amitriptyline, clomipramine, desipramine, imipramine, nortriptyline, and others  warfarin This list may not describe all possible interactions. Give your health care provider a list of all the medicines, herbs, non-prescription drugs, or dietary supplements you use. Also tell them if you smoke, drink alcohol, or use illegal drugs. Some items may interact with your medicine. What should I watch for while using this medicine? Your doctor may monitor your liver function. Tell your doctor right away if you have nausea or vomiting, loss of appetite, stomach pain on your right upper side, yellow skin, dark urine, light stools, or are over tired. This medicine may cause serious skin reactions. They can happen weeks to months after starting the medicine. Contact your health care provider right away if you notice fevers or flu-like symptoms   with a rash. The rash may be red or purple and then turn into blisters or peeling of the skin. Or, you might notice a red rash with swelling of the face, lips or lymph nodes in your neck or under your arms. You need to take  this medicine for 6 weeks or longer to cure the fungal infection. Take your medicine regularly for as long as your doctor or health care provider tells you to. What side effects may I notice from receiving this medicine? Side effects that you should report to your doctor or health care professional as soon as possible:  allergic reactions like skin rash or hives, swelling of the face, lips, or tongue  change in vision  dark urine  fever or infection  general ill feeling or flu-like symptoms  light-colored stools  loss of appetite, nausea  rash, fever, and swollen lymph nodes  redness, blistering, peeling or loosening of the skin, including inside the mouth  right upper belly pain  unusually weak or tired  yellowing of the eyes or skin Side effects that usually do not require medical attention (report to your doctor or health care professional if they continue or are bothersome):  changes in taste  diarrhea  hair loss  muscle or joint pain  stomach upset This list may not describe all possible side effects. Call your doctor for medical advice about side effects. You may report side effects to FDA at 1-800-FDA-1088. Where should I keep my medicine? Keep out of the reach of children. Store at room temperature between 15 and 30 degrees C (59 and 86 degrees F). Throw away any unused medicine after the expiration date. NOTE: This sheet is a summary. It may not cover all possible information. If you have questions about this medicine, talk to your doctor, pharmacist, or health care provider.  2020 Elsevier/Gold Standard (2018-09-10 15:35:11)  

## 2019-10-28 NOTE — Progress Notes (Signed)
Subjective: 69 year old female presents the office today for follow-up evaluation of right foot pain.  She states that she is still having pain submetatarsal 2 on the right foot where she points.  Metatarsal pads were not helpful as she thinks they were too bulky for her shoes.  Also she has not thought too much about nail fungus treatment, Lamisil.  She was to further discuss this as well. Denies any systemic complaints such as fevers, chills, nausea, vomiting. No acute changes since last appointment, and no other complaints at this time.   Objective: AAO x3, NAD DP/PT pulses palpable bilaterally, CRT less than 3 seconds Moderate bunion deformities present with hammertoe contractures.  Tenderness submetatarsal 2.  No callus or open sores.  No other areas of tenderness identified today.  The nails are hypertrophic, dystrophic and discolored with yellow-brown discoloration.  There is no pain in the nails and there is no redness or drainage or any signs of infection.  No open lesions or pre-ulcerative lesions.  No pain with calf compression, swelling, warmth, erythema  Assessment: Right foot bunion, hammertoe deformity, submetatarsal 2 pain; onychomycosis  Plan: -All treatment options discussed with the patient including all alternatives, risks, complications.  -Regards to nail fungus she was considered Lamisil.  I gave her literature on this meals discussed side effects.  If she wants to do this she will let me know we will order CBC and LFT prior to starting the medication. -Regards to the foot pain metatarsal pads have not been helpful.  Discussed orthotics, shoe modifications.  She states that she want to consider surgery for second toe.  Unfortunately given the bunion this will need to be corrected as well.  We discussed left [place], possible Akin bunionectomy with second tarsal osteotomy and hammertoe repair with K wire fixation.  She will consider her options. -Patient encouraged to call the  office with any questions, concerns, change in symptoms.   Trula Slade DPM

## 2019-11-01 ENCOUNTER — Encounter: Payer: Self-pay | Admitting: Internal Medicine

## 2019-11-01 LAB — HM DIABETES EYE EXAM

## 2019-11-02 ENCOUNTER — Encounter: Payer: Self-pay | Admitting: Internal Medicine

## 2019-11-08 ENCOUNTER — Encounter: Payer: Self-pay | Admitting: Allergy and Immunology

## 2019-11-08 ENCOUNTER — Ambulatory Visit: Payer: Medicare Other | Admitting: Allergy and Immunology

## 2019-11-08 ENCOUNTER — Other Ambulatory Visit: Payer: Self-pay

## 2019-11-08 VITALS — BP 110/72 | HR 62 | Temp 97.6°F | Resp 16

## 2019-11-08 DIAGNOSIS — G4720 Circadian rhythm sleep disorder, unspecified type: Secondary | ICD-10-CM | POA: Diagnosis not present

## 2019-11-08 DIAGNOSIS — G43909 Migraine, unspecified, not intractable, without status migrainosus: Secondary | ICD-10-CM

## 2019-11-08 DIAGNOSIS — L03031 Cellulitis of right toe: Secondary | ICD-10-CM

## 2019-11-08 DIAGNOSIS — J3089 Other allergic rhinitis: Secondary | ICD-10-CM

## 2019-11-08 DIAGNOSIS — K219 Gastro-esophageal reflux disease without esophagitis: Secondary | ICD-10-CM | POA: Diagnosis not present

## 2019-11-08 MED ORDER — AMOXICILLIN-POT CLAVULANATE 875-125 MG PO TABS
ORAL_TABLET | ORAL | 0 refills | Status: DC
Start: 2019-11-08 — End: 2019-11-21

## 2019-11-08 MED ORDER — MUPIROCIN 2 % EX OINT: TOPICAL_OINTMENT | CUTANEOUS | 0 refills | Status: DC

## 2019-11-08 NOTE — Patient Instructions (Signed)
  1.  Allergen avoidance measures - dust mite, pollens, molds, cat  2.  Continue to treat and prevent inflammation:   A.  Loratadine 10 mg - 1 tablet 1-2 times per day  3.  Continue to Treat and prevent reflux:   A.  omeprazole to 40 mg twice a day   C.  famotidine 40 mg in evening  4. Treat headache and sleep dysfunction:   A. Periactin 4mg  - 1/2 tablet at bedtime  5. If needed:   A. Nasal ipratropium 0.06% - 1-2 sprays each nostril every 6 hours to dry nose.  6.  Treat infected toe with the following:   A. Augmentin 875 - 1 tablet 2 times a day for 14 days  B. Bactroban - apply to toe / nail 3 times a day for 14 days  C. Visit with [facility]  7. Return to clinic in 12 weeks or earlier if problem.

## 2019-11-08 NOTE — Progress Notes (Signed)
[place] [facility]   Follow-up Note  Referring Provider: Unk Pinto, MD Primary Provider: Unk Pinto, MD Date of Office Visit: 11/08/2019  Subjective:   [name] (DOB: 09/11/1950) is a 69 y.o. female who returns to the [facility] on 11/08/2019 in re-evaluation of the following:  HPI: Timika returns to this clinic in evaluation of allergic rhinitis, LPR,  migraine syndrome and sleep dysfunction.  Her last visit to this clinic was 13 September 2019.  Once again her airway is doing well with very little issues involving her upper airway and her postnasal drip and raspy voice and cough have improved to the 90% level.  She continues to use loratadine and a combination of therapy directed against reflux including a proton pump inhibitor and H2 receptor blocker and occasionally some nasal ipratropium.  She still has headaches and she still has sleep dysfunction with fractured sleep.  Her headache frequency and intensity is the same and her fractured sleep may be slightly better since she has altered her lifestyle prior to going to bed.  But it is still a very fractured and light sleep.  She did not start her Periactin.  She notes that she has developed a painful right toe over the course of the past week.  Allergies as of 11/08/2019      Reactions   Bactrim [sulfamethoxazole-trimethoprim] Other (See Comments)   Patient preference to take medication without sulfa      Medication List      aspirin 81 MG tablet Take 81 mg by mouth daily.   cyproheptadine 4 MG tablet Commonly known as: PERIACTIN 1/2-1 tablet at bedtime   D-3-5 125 MCG (5000 UT) capsule Generic drug: Cholecalciferol Take 5,000 Units by mouth daily.   ezetimibe 10 MG tablet Commonly known as: ZETIA Take 1 tablet Daily for Cholesterol   famotidine 40 MG tablet Commonly known as: PEPCID Take 1 tablet by mouth once daily in the evening     hyoscyamine 0.375 MG 12 hr tablet Commonly known as: LEVBID Take by mouth.   ipratropium 0.06 % nasal spray Commonly known as: ATROVENT 1-2 sprays each nostril every 6 hours to dry up your nose.   loratadine 10 MG tablet Commonly known as: CLARITIN Take 1 tablet 1-2 times daily   Lutein 20 MG Tabs Take by mouth.   NASACORT AQ NA Place into the nose.   omeprazole 40 MG capsule Commonly known as: PRILOSEC Take 1 capsule by mouth twice daily   [place] Patient takes MOM as needed.   Restasis 0.05 % ophthalmic emulsion Generic drug: cycloSPORINE instill 1 drop into both eyes twice a day   WOMENS MULTI VITAMIN & MINERAL PO Take 1 tablet by mouth daily.   Airborne Google 1 tablet by mouth as needed.       Past Medical History:  Diagnosis Date  . Arthritis    cervical and lumbar  . Depression   . GERD (gastroesophageal reflux disease)   . Hyperlipidemia   . IBS (irritable bowel syndrome)   . Prediabetes   . Vitamin D deficiency     Past Surgical History:  Procedure Laterality Date  . CARDIAC CATHETERIZATION  2011   normal (Dr. Terrence Dupont)  . RADIOACTIVE SEED GUIDED EXCISIONAL BREAST BIOPSY Right 03/11/2016   Procedure: RIGHT RADIOACTIVE SEED GUIDED EXCISIONAL BREAST BIOPSY;  Surgeon: Rolm Bookbinder, MD;  Location: [facility];  Service: General;  Laterality:  Right;    Review of systems negative except as noted in HPI / PMHx or noted below:  Review of Systems  Constitutional: Negative.   HENT: Negative.   Eyes: Negative.   Respiratory: Negative.   Cardiovascular: Negative.   Gastrointestinal: Negative.   Genitourinary: Negative.   Musculoskeletal: Negative.   Skin: Negative.   Neurological: Negative.   Endo/Heme/Allergies: Negative.   Psychiatric/Behavioral: Negative.      Objective:   Vitals:   11/08/19 0905  BP: 110/72  Pulse: 62  Resp: 16  Temp: 97.6 F (36.4 C)  SpO2: 97%          Physical  Exam Constitutional:      Appearance: She is not diaphoretic.  HENT:     Head: Normocephalic.     Right Ear: Tympanic membrane, ear canal and external ear normal.     Left Ear: Tympanic membrane, ear canal and external ear normal.     Nose: Nose normal. No mucosal edema or rhinorrhea.     Mouth/Throat:     Pharynx: Uvula midline. No oropharyngeal exudate.  Eyes:     Conjunctiva/sclera: Conjunctivae normal.  Neck:     Thyroid: No thyromegaly.     Trachea: Trachea normal. No tracheal tenderness or tracheal deviation.  Cardiovascular:     Rate and Rhythm: Normal rate and regular rhythm.     Heart sounds: Normal heart sounds, S1 normal and S2 normal. No murmur.  Pulmonary:     Effort: No respiratory distress.     Breath sounds: Normal breath sounds. No stridor. No wheezing or rales.  Lymphadenopathy:     Head:     Right side of head: No tonsillar adenopathy.     Left side of head: No tonsillar adenopathy.     Cervical: No cervical adenopathy.  Skin:    Findings: No erythema or rash (Slightly tender erythematous medial right number fourth toe surrounding hypertrophied nail.).     Nails: There is no clubbing.  Neurological:     Mental Status: She is alert.     Diagnostics: none  Assessment and Plan:   1. Perennial allergic rhinitis   2. LPRD (laryngopharyngeal reflux disease)   3. Migraine syndrome   4. 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 Infected nailbed of toe, right     1.  Allergen avoidance measures - dust mite, pollens, molds, cat  2.  Continue to treat and prevent inflammation:   A.  Loratadine 10 mg - 1 tablet 1-2 times per day  3.  Continue to Treat and prevent reflux:   A.  omeprazole to 40 mg twice a day   C.  famotidine 40 mg in evening  4. Treat headache and sleep dysfunction:   A. Periactin 4mg  - 1/2 tablet at bedtime  5. If needed:   A. Nasal ipratropium 0.06% - 1-2 sprays each nostril every 6 hours to dry nose.  6.  Treat  infected toe with the following:   A. Augmentin 875 - 1 tablet 2 times a day for 14 days  B. Bactroban - apply to toe / nail 3 times a day for 14 days  C. Visit with [facility]  7. Return to clinic in 12 weeks or earlier if problem.    I have encouraged wanted to try Periactin at bedtime to see if this helps with her fractured sleep and headaches.  Certainly if she develops side effects while utilizing this medication she will contact me for further  directions.  We will continue to have her use therapy directed against reflux which is working quite well for her a LPR and she can continue on loratadine and nasal ipratropium.  Her toe is infected and we will treat her with the therapy noted above and refer her onto [facility] for more definitive approach to this issue.  I will see her back in this clinic in 12 weeks or earlier if there is a problem.  Allena Katz, MD Allergy / Immunology [place]

## 2019-11-09 ENCOUNTER — Encounter: Payer: Self-pay | Admitting: Allergy and Immunology

## 2019-11-11 ENCOUNTER — Ambulatory Visit: Payer: Medicare Other | Admitting: Podiatry

## 2019-11-11 ENCOUNTER — Other Ambulatory Visit: Payer: Self-pay

## 2019-11-11 DIAGNOSIS — L603 Nail dystrophy: Secondary | ICD-10-CM

## 2019-11-15 ENCOUNTER — Ambulatory Visit: Payer: Medicare Other | Admitting: Physician Assistant

## 2019-11-17 ENCOUNTER — Encounter: Payer: Self-pay | Admitting: Podiatry

## 2019-11-17 NOTE — Progress Notes (Signed)
Subjective:  Patient ID: [name], female    DOB: 1950-06-20,  MRN: [id number]  Chief Complaint  Patient presents with  . Nail Problem    pt is here for a possible ingrown toenail of the right 4th toenail lateral side, pt also states that the toenail has been red and tender to the touch    69 y.o. female presents with the above complaint.  Patient presents with a complaint of right fourth digit total nail dystrophy.  Patient states that is causing her a lot of pain.  There is redness and tenderness associated with it.  The nail itself is very thick elongated dystrophic.  There is pain on palpation across entire nail.  Patient is try cutting it but is not helped.  He she has tried taking meloxicam and mupirocin which has not helped.  There is no part purulent drainage or pus.   Review of Systems: Negative except as noted in the HPI. Denies N/V/F/Ch.  Past Medical History:  Diagnosis Date  . Arthritis    cervical and lumbar  . Depression   . GERD (gastroesophageal reflux disease)   . Hyperlipidemia   . IBS (irritable bowel syndrome)   . Prediabetes   . Vitamin D deficiency     Current Outpatient Medications:  .  amoxicillin-clavulanate (AUGMENTIN) 875-125 MG tablet, 1 tablet 2 times a day for 14 days, Disp: 28 tablet, Rfl: 0 .  aspirin 81 MG tablet, Take 81 mg by mouth daily., Disp: , Rfl:  .  Cholecalciferol (D-3-5) 5000 UNITS capsule, Take 5,000 Units by mouth daily., Disp: , Rfl:  .  COVID-19 Specimen Collection KIT, See admin instructions. for testing, Disp: , Rfl:  .  cyproheptadine (PERIACTIN) 4 MG tablet, 1/2-1 tablet at bedtime, Disp: 30 tablet, Rfl: 5 .  ezetimibe (ZETIA) 10 MG tablet, Take 1 tablet Daily for Cholesterol, Disp: 90 tablet, Rfl: 3 .  famotidine (PEPCID) 40 MG tablet, Take 1 tablet by mouth once daily in the evening, Disp: 30 tablet, Rfl: 5 .  ipratropium (ATROVENT) 0.06 % nasal spray, 1-2 sprays each nostril every 6 hours to dry up your nose., Disp: 15  mL, Rfl: 5 .  loratadine (CLARITIN) 10 MG tablet, Take 1 tablet 1-2 times daily, Disp: 60 tablet, Rfl: 3 .  Lutein 20 MG TABS, Take by mouth., Disp: , Rfl:  .  Multiple Vitamins-Minerals (AIRBORNE) CHEW, Chew 1 tablet by mouth as needed., Disp: , Rfl:  .  Multiple Vitamins-Minerals (WOMENS MULTI VITAMIN & MINERAL PO), Take 1 tablet by mouth daily., Disp: , Rfl:  .  mupirocin ointment (BACTROBAN) 2 %, Apply to toe/nail 3 times a day for 14 days, Disp: 22 g, Rfl: 0 .  omeprazole (PRILOSEC) 40 MG capsule, Take 1 capsule by mouth twice daily, Disp: 60 capsule, Rfl: 5 .  OVER THE COUNTER MEDICATION, Patient takes MOM as needed., Disp: , Rfl:  .  RESTASIS 0.05 % ophthalmic emulsion, instill 1 drop into both eyes twice a day, Disp: , Rfl: 0 .  Triamcinolone Acetonide (NASACORT AQ NA), Place into the nose., Disp: , Rfl:  .  hyoscyamine (LEVBID) 0.375 MG 12 hr tablet, Take by mouth., Disp: , Rfl:   Social History   Tobacco Use  Smoking Status Never Smoker  Smokeless Tobacco Never Used    Allergies  Allergen Reactions  . Bactrim [Sulfamethoxazole-Trimethoprim] Other (See Comments)    Patient preference to take medication without sulfa   Objective:  There were no vitals filed for this  visit. There is no height or weight on file to calculate BMI. Constitutional Well developed. Well nourished.  Vascular Dorsalis pedis pulses palpable bilaterally. Posterior tibial pulses palpable bilaterally. Capillary refill normal to all digits.  No cyanosis or clubbing noted. Pedal hair growth normal.  Neurologic Normal speech. Oriented to person, place, and time. Epicritic sensation to light touch grossly present bilaterally.  Dermatologic Pain on palpation of the entire/total nail on 4th digit of the right No other open wounds. No skin lesions.  Orthopedic: Normal joint ROM without pain or crepitus bilaterally. No visible deformities. No bony tenderness.   Radiographs: None Assessment:  No  diagnosis found. Plan:  Patient was evaluated and treated and all questions answered.  Nail contusion/dystrophy right fourth digit -I explained the patient the etiology of nail dystrophy with the setting of onychomycosis and various treatment options were extensively discussed.  Given the nature of the dystrophic nail I believe patient will benefit from total nail avulsion without matricectomy.  I explained this to the patient in extensive detail and also gave her the option of phenol matricectomy as well to make it permanent.  Patient would like to think about the option and she will call back to schedule an appointment when she is ready to have the entire nail removed.   No follow-ups on file.

## 2019-11-21 ENCOUNTER — Other Ambulatory Visit: Payer: Self-pay

## 2019-11-21 ENCOUNTER — Ambulatory Visit: Payer: Medicare Other | Admitting: Physician Assistant

## 2019-11-21 ENCOUNTER — Encounter: Payer: Self-pay | Admitting: Physician Assistant

## 2019-11-21 VITALS — BP 122/74 | HR 66 | Ht 64.0 in | Wt 144.0 lb

## 2019-11-21 DIAGNOSIS — F40298 Other specified phobia: Secondary | ICD-10-CM

## 2019-11-21 DIAGNOSIS — M810 Age-related osteoporosis without current pathological fracture: Secondary | ICD-10-CM

## 2019-11-21 DIAGNOSIS — R0609 Other forms of dyspnea: Secondary | ICD-10-CM

## 2019-11-21 DIAGNOSIS — E559 Vitamin D deficiency, unspecified: Secondary | ICD-10-CM | POA: Diagnosis not present

## 2019-11-21 DIAGNOSIS — Z79899 Other long term (current) drug therapy: Secondary | ICD-10-CM | POA: Diagnosis not present

## 2019-11-21 DIAGNOSIS — R0989 Other specified symptoms and signs involving the circulatory and respiratory systems: Secondary | ICD-10-CM

## 2019-11-21 DIAGNOSIS — E782 Mixed hyperlipidemia: Secondary | ICD-10-CM

## 2019-11-21 DIAGNOSIS — Z0001 Encounter for general adult medical examination with abnormal findings: Secondary | ICD-10-CM | POA: Diagnosis not present

## 2019-11-21 DIAGNOSIS — R6889 Other general symptoms and signs: Secondary | ICD-10-CM

## 2019-11-21 DIAGNOSIS — K219 Gastro-esophageal reflux disease without esophagitis: Secondary | ICD-10-CM

## 2019-11-21 DIAGNOSIS — R7309 Other abnormal glucose: Secondary | ICD-10-CM

## 2019-11-21 DIAGNOSIS — I498 Other specified cardiac arrhythmias: Secondary | ICD-10-CM

## 2019-11-21 DIAGNOSIS — Z0000 Encounter for general adult medical examination without abnormal findings: Secondary | ICD-10-CM

## 2019-11-21 DIAGNOSIS — R0600 Dyspnea, unspecified: Secondary | ICD-10-CM

## 2019-11-21 DIAGNOSIS — R300 Dysuria: Secondary | ICD-10-CM

## 2019-11-21 NOTE — Progress Notes (Signed)
MEDICARE ANNUAL WELLNESS VISIT AND FOLLOW UP  Assessment:    Encounter for Medicare annual wellness exam 1 year  Labile hypertension - continue medications, DASH diet, exercise and monitor at home. Call if greater than 130/80.  -     CBC with Differential/Platelet -     COMPLETE METABOLIC PANEL WITH GFR -     TSH  Hyperlipidemia, mixed -     Lipid panel check lipids decrease fatty foods increase activity.   Abnormal glucose -     Hemoglobin A1c  Vitamin D deficiency Continue supplement  Medication management  Osteoporosis, unspecified osteoporosis type, unspecified pathological fracture presence Monitor  Gastroesophageal reflux disease, unspecified whether esophagitis present Continue PPI/H2 blocker, diet discussed  Fear of side effects of medication Continue counseling  Bigeminy Monitor  DOE (dyspnea on exertion) Resolved  Dysuria -     Urinalysis, Routine w reflex microscopic -     Urine Culture    Over 40 minutes of exam, counseling, chart review and critical decision making was performed Future Appointments  Date Time Provider [place]  12/09/2019  3:15 PM Felipa Furnace, DPM [facility]  01/31/2020  9:00 AM Kozlow, Donnamarie Poag, MD [facility] None  03/20/2020  9:00 AM Unk Pinto, MD [facility] None     Plan:   During the course of the visit the patient was educated and counseled about appropriate screening and preventive services including:    Pneumococcal vaccine   Prevnar 13  Influenza vaccine  Td vaccine  Screening electrocardiogram  Bone densitometry screening  Colorectal cancer screening  Diabetes screening  Glaucoma screening  Nutrition counseling   Advanced directives: requested   Subjective:  [name] is a 69 y.o. female who presents for Medicare Annual Wellness Visit and 3 month follow up.    She has had bilateral arm pain, 3-4, worse at night with lying still, bending it is worse with  sleeping, she feels some numbness tingling on top of her hands and ulnar side. Worse with resting elbow on something. She has been having some neck pain. Has some pain in her forearm. Saw Dr. Jaynee Eagles in April and did not get recommended EMG/cervical MRI.   She has been having gawing stomach pain x 2-3 weeks, off and on, worse WITHOUT food, better with food. Got off her prilosec 40 mg twice a day 4 weeks ago. Last colonoscopy 2020. Has not had EGD.  She is on airborne sporacdically, no alcohol, no NSAIDS.  AB [place] 2018 gallbladder polyps/stones, no inflammation- no AB pain, no RUQ pain, no pain through to her back.   BMI is Body mass index is 24.15 kg/m., she is working on diet and exercise. Wt Readings from Last 3 Encounters:  11/21/19 144 lb (65.3 kg)  09/13/19 142 lb 6.4 oz (64.6 kg)  08/29/19 141 lb 6.4 oz (64.1 kg)    Her blood pressure has been controlled at home, today their BP is BP: 122/74 She does not workout .She denies chest pain, shortness of breath, dizziness.  She is not on cholesterol medication and denies myalgias. Her cholesterol is at goal. The cholesterol last visit was:   Lab Results  Component Value Date   CHOL 197 08/29/2019   HDL 75 08/29/2019   LDLCALC 109 (H) 08/29/2019   TRIG 51 08/29/2019   CHOLHDL 2.6 08/29/2019    Last A1C in the office was:  Lab Results  Component Value Date   HGBA1C 5.7 (H) 08/29/2019   Last GFR: Lab  Results  Component Value Date   GFRAA 85 08/29/2019   Patient is on Vitamin D supplement, on 5000 IU.   Lab Results  Component Value Date   VD25OH 71 08/29/2019      Medication Review:   Current Outpatient Medications (Cardiovascular):  .  ezetimibe (ZETIA) 10 MG tablet, Take 1 tablet Daily for Cholesterol  Current Outpatient Medications (Respiratory):  .  cyproheptadine (PERIACTIN) 4 MG tablet, 1/2-1 tablet at bedtime .  ipratropium (ATROVENT) 0.06 % nasal spray, 1-2 sprays each nostril every 6 hours to dry up your nose. .   loratadine (CLARITIN) 10 MG tablet, Take 1 tablet 1-2 times daily .  Triamcinolone Acetonide (NASACORT AQ NA), Place into the nose.  Current Outpatient Medications (Analgesics):  .  aspirin 81 MG tablet, Take 81 mg by mouth daily.   Current Outpatient Medications (Other):  .  amoxicillin-clavulanate (AUGMENTIN) 875-125 MG tablet, 1 tablet 2 times a day for 14 days .  Cholecalciferol (D-3-5) 5000 UNITS capsule, Take 5,000 Units by mouth daily. Marland Kitchen  COVID-19 Specimen Collection KIT, See admin instructions. for testing .  famotidine (PEPCID) 40 MG tablet, Take 1 tablet by mouth once daily in the evening .  hyoscyamine (LEVBID) 0.375 MG 12 hr tablet, Take by mouth. .  Lutein 20 MG TABS, Take by mouth. .  Multiple Vitamins-Minerals (AIRBORNE) CHEW, Chew 1 tablet by mouth as needed. .  Multiple Vitamins-Minerals (WOMENS MULTI VITAMIN & MINERAL PO), Take 1 tablet by mouth daily. .  mupirocin ointment (BACTROBAN) 2 %, Apply to toe/nail 3 times a day for 14 days .  omeprazole (PRILOSEC) 40 MG capsule, Take 1 capsule by mouth twice daily .  OVER THE COUNTER MEDICATION, Patient takes MOM as needed. .  RESTASIS 0.05 % ophthalmic emulsion, instill 1 drop into both eyes twice a day   Allergies  Allergen Reactions  . Bactrim [Sulfamethoxazole-Trimethoprim] Other (See Comments)    Patient preference to take medication without sulfa    Current Problems (verified) Patient Active Problem List   Diagnosis Date Noted  . Fear of side effects of medication 08/28/2019  . Upper airway cough syndrome 05/04/2019  . DOE (dyspnea on exertion) 05/03/2019  . 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 Osteoporosis 08/11/2017  . Abnormal glucose 06/25/2017  . Medication management 12/12/2014  . 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 GERD (gastroesophageal reflux disease)   . Vitamin D deficiency   . Hyperlipidemia, mixed     Screening Tests Immunization History  Administered Date(s) Administered  . Influenza, High Dose Seasonal  PF 06/25/2017  . Influenza-Unspecified 03/24/2018, 03/29/2019  . PFIZER SARS-COV-2 Vaccination 07/08/2019, 07/29/2019  . PPD Test 12/12/2014, 12/26/2015, 01/15/2017  . Pneumococcal Conjugate-13 11/10/2018  . Td 05/18/2007  . Zoster 05/17/2006   Health Maintenance  Topic Date Due  . PNA vac Low Risk Adult (2 of 2 - PPSV23) 11/10/2019  . INFLUENZA VACCINE  01/15/2020  . MAMMOGRAM  01/26/2020  . COLONOSCOPY  10/17/2028  . TETANUS/TDAP  03/28/2029  . DEXA SCAN  Completed  . COVID-19 Vaccine  Completed  . Hepatitis C Screening  Completed    Preventative care: Last colonoscopy: 10/2018 [facility] Last mammogram: 01/2019 Last pap smear/pelvic exam: 04/06/2017 DEXA: 07/2017 CXR 12/2016 MRI brain 2014 CT AB 2015 Echo 03/2018  Prior vaccinations: TD or Tdap: 2020  Influenza: 2020 Pneumococcal: DUE Prevnar13: 2020 Shingrix: discussed  Names of Other Physician/Practitioners you currently use: 1. [facility] here for primary care 2. Dr. Kathlen Mody, eye doctor, wears glasses 2020  3. Dr. Johnnye Sima, dentist 2020 Patient Care Team: Unk Pinto, MD as PCP - General (Internal Medicine) Juanita Craver, MD as Consulting Physician (Gastroenterology) Monna Fam, MD as Consulting Physician (Ophthalmology) Charolette Forward, MD as Consulting Physician (Cardiology)  SURGICAL HISTORY She  has a past surgical history that includes Cardiac catheterization (2011) and Radioactive seed guided excisional breast biopsy (Right, 03/11/2016). FAMILY HISTORY Her family history includes Cancer in her maternal grandmother; Diabetes in her father; Heart failure in her father; Hypertension in her mother; Other in her mother. SOCIAL HISTORY She  reports that she has never smoked. She has never used smokeless tobacco. She reports current alcohol use. She reports that she does not use drugs.   MEDICARE WELLNESS OBJECTIVES: Physical activity: Current Exercise Habits: Home  exercise routine, Type of exercise: walking, Intensity: Mild Cardiac risk factors: Cardiac Risk Factors include: advanced age (>50mn, >>34women);dyslipidemia;hypertension;sedentary lifestyle Depression/mood screen:   [facility] 02/16/2019  Decreased Interest 0  Down, Depressed, Hopeless 0  PHQ - 2 Score 0    ADLs:  In your present state of health, do you have any difficulty performing the following activities: 11/21/2019 02/16/2019  Hearing? N N  Vision? N N  Difficulty concentrating or making decisions? N N  Walking or climbing stairs? N N  Dressing or bathing? N N  Doing errands, shopping? N N  Some recent data might be hidden     Cognitive Testing  Alert? Yes  Normal Appearance?Yes  Oriented to person? Yes  Place? Yes   Time? Yes  Recall of three objects?  Yes  Can perform simple calculations? Yes  Displays appropriate judgment?Yes  Can read the correct time from a watch face?Yes  EOL planning: Does Patient Have a Medical Advance Directive?: No Would patient like information on creating a medical advance directive?: Yes (MAU/Ambulatory/Procedural Areas - Information given)  Review of Systems  Constitutional: Negative.   HENT: Negative.   Eyes: Negative.   Respiratory: Negative.   Cardiovascular: Negative.   Gastrointestinal: Negative.   Genitourinary: Negative.   Musculoskeletal: Negative.   Skin: Negative.   Neurological: Negative.   Endo/Heme/Allergies: Negative.   Psychiatric/Behavioral: Negative.     Objective:     Today's Vitals   11/21/19 1643  BP: 122/74  Pulse: 66  Weight: 144 lb (65.3 kg)   Body mass index is 24.15 kg/m.  General appearance: alert, no distress, WD/WN, female HEENT: normocephalic, sclerae anicteric, TMs pearly after bilateral cerumen removal, nares patent, no discharge or erythema, pharynx normal, nontender temporal  Oral cavity: MMM, no lesions Neck: supple, no lymphadenopathy, no thyromegaly, no masses Heart: RRR,  normal S1, S2, no murmurs Lungs: CTA bilaterally, no wheezes, rhonchi, or rales Abdomen: +bs, soft, + epigastric tenderness, non distended, no masses, no hepatomegaly, no splenomegaly Musculoskeletal: nontender, no swelling, no obvious deformity Extremities: no edema, no cyanosis, no clubbing, Bilateral ankle is normal, no swelling, pain, full ROM. Right foot + pain at 3rd and 4th intermetatarsal space, + lateral squeeze test. Good distal neurovascular exam.  Pulses: 2+ symmetric, upper and lower extremities, normal cap refill Neurological: alert, oriented x 3, CN2-12 intact, strength normal upper extremities and lower extremities, sensation normal throughout, DTRs 2+ throughout, no cerebellar signs, gait normal Psychiatric: normal affect, behavior normal, pleasant    Medicare Attestation I have personally reviewed: The patient's medical and social history Their use of alcohol, tobacco or illicit drugs Their current medications and supplements The patient's functional ability including ADLs,fall risks, home safety risks, cognitive, and hearing  and visual impairment Diet and physical activities Evidence for depression or mood disorders  The patient's weight, height, BMI, and visual acuity have been recorded in the chart.  I have made referrals, counseling, and provided education to the patient based on review of the above and I have provided the patient with a written personalized care plan for preventive services.     Vicie Mutters, PA-C   11/21/2019

## 2019-11-21 NOTE — Patient Instructions (Addendum)
Suggest contacting Dr. Cathren Laine office to complete an EMG study to find out if the pain is from your neck or your elbow.   Get carpal tunnel brace and wear at night.   Follow up with GI doctor for possible EGD If you are having issues Go back up on the prilosec  Electromyoneurogram Electromyoneurogram is a test to check how well your muscles and nerves are working. This procedure includes the combined use of electromyogram (EMG) and nerve conduction study (NCS). EMG is used to look for muscular disorders. NCS, which is also called electroneurogram, measures how well your nerves are controlling your muscles. The procedures are usually done together to check if your muscles and nerves are healthy. If the results of the tests are abnormal, this may indicate disease or injury, such as a neuromuscular disease or peripheral nerve damage. Tell a health care provider about:  Any allergies you have.  All medicines you are taking, including vitamins, herbs, eye drops, creams, and over-the-counter medicines.  Any problems you or family members have had with anesthetic medicines.  Any blood disorders you have.  Any surgeries you have had.  Any medical conditions you have.  If you have a pacemaker.  Whether you are pregnant or may be pregnant. What are the risks? Generally, this is a safe procedure. However, problems may occur, including:  Infection where the electrodes were inserted.  Bleeding. What happens before the procedure? Medicines Ask your health care provider about:  Changing or stopping your regular medicines. This is especially important if you are taking diabetes medicines or blood thinners.  Taking medicines such as aspirin and ibuprofen. These medicines can thin your blood. Do not take these medicines unless your health care provider tells you to take them.  Taking over-the-counter medicines, vitamins, herbs, and supplements. General instructions  Your health care  provider may ask you to avoid: ? Beverages that have caffeine, such as coffee and tea. ? Any products that contain nicotine or tobacco. These products include cigarettes, e-cigarettes, and chewing tobacco. If you need help quitting, ask your health care provider.  Do not use lotions or creams on the same day that you will be having the procedure. What happens during the procedure? For EMG   Your health care provider will ask you to stay in a position so that he or she can access the muscle that will be studied. You may be standing, sitting, or lying down.  You may be given a medicine that numbs the area (local anesthetic).  A very thin needle that has an electrode will be inserted into your muscle.  Another small electrode will be placed on your skin near the muscle.  Your health care provider will ask you to continue to remain still.  The electrodes will send a signal that tells about the electrical activity of your muscles. You may see this on a monitor or hear it in the room.  After your muscles have been studied at rest, your health care provider will ask you to contract or flex your muscles. The electrodes will send a signal that tells about the electrical activity of your muscles.  Your health care provider will remove the electrodes and the electrode needles when the procedure is finished. The procedure may vary among health care providers and hospitals. For NCS   An electrode that records your nerve activity (recording electrode) will be placed on your skin by the muscle that is being studied.  An electrode that is used as a  reference (reference electrode) will be placed near the recording electrode.  A paste or gel will be applied to your skin between the recording electrode and the reference electrode.  Your nerve will be stimulated with a mild shock. Your health care provider will measure how much time it takes for your muscle to react.  Your health care provider will  remove the electrodes and the gel when the procedure is finished. The procedure may vary among health care providers and hospitals. What happens after the procedure?  It is up to you to get the results of your procedure. Ask your health care provider, or the department that is doing the procedure, when your results will be ready.  Your health care provider may: ? Give you medicines for any pain. ? Monitor the insertion sites to make sure that bleeding stops. Summary  Electromyoneurogram is a test to check how well your muscles and nerves are working.  If the results of the tests are abnormal, this may indicate disease or injury.  This is a safe procedure. However, problems may occur, such as bleeding and infection.  Your health care provider will do two tests to complete this procedure. One checks your muscles (EMG) and another checks your nerves (NCS).  It is up to you to get the results of your procedure. Ask your health care provider, or the department that is doing the procedure, when your results will be ready. This information is not intended to replace advice given to you by your health care provider. Make sure you discuss any questions you have with your health care provider. Document Revised: 02/16/2018 Document Reviewed: 01/29/2018 Elsevier Patient Education  [place].  Tarsal Tunnel Syndrome  Tarsal tunnel syndrome is a condition that happens when a nerve called the tibial nerve is irritated or squeezed (compressed) as it passes through an area on the inside of your ankle (tarsal tunnel). The tarsal tunnel is a narrow passage through the connective tissue and bones in your feet (tarsals). The tibial nerve passes behind the large bony bump at your inner ankle (medial malleolus) and sends branches to your foot and toes. This nerve enables feeling by passing signals to your heel, the bottom of your foot, and some of your toe muscles. Tarsal tunnel syndrome usually causes  ankle and foot pain that gets worse with activity. What are the causes? Tarsal tunnel syndrome can be caused by any condition that narrows the space in the tarsal tunnel. Athletes may get tarsal tunnel syndrome from a fractured ankle or from an outward (eversion) ankle sprain that results in scarring or swelling. Other common causes include:  Overpronation. This is when your feet roll inward and flatten too much when you stand, walk, or run.  Extra pressure on the tarsal tunnel area from tight or stiff shoes or boots.  Decreased room in the tarsal tunnel due to small, fluid-filled sacs (cysts) or growths on the bones near the tunnel (exostosis). What increases the risk? This condition is more likely to develop in people who:  Play sports where they wear high, stiff boots, such as downhill skiing.  Play sports with repetitive motion, such as running.  Play sports on uneven surfaces that can lead to a sprained ankle, such as soccer or football.  Have had an inner ankle injury.  Have flat feet.  Have other conditions, such as diabetes, hypothyroidism, or rheumatoid arthritis. What are the signs or symptoms? Symptoms of this condition include:  Burning pain behind the ankle,  in the heel, or in the foot. This pain gets worse if you are standing, walking, or running.  Numbness or a prickling and tingling sensation in your heel, foot, or toes.  Swelling in your ankle or heel. At first, your symptoms may get worse with activity and be relieved with rest. Over time, your symptoms may become constant or come on sooner with less activity. How is this diagnosed? This condition is diagnosed based on:  Your symptoms.  Your medical history.  A physical exam. Your health care provider may tap on the area below your ankle to check for tingling in your foot or toes.  You may also have other tests, including: ? X-rays to check bone structure. ? An MRI or ultrasound to examine nerve and tendon  structures and find where your nerve is getting compressed. ? A study of nerve function (electromyography or EMG). How is this treated? Treatment may include:  Wearing a removable splint or boot for ankle support.  Using a shoe insert (orthotic) to help support your arch.  Taking NSAIDs to reduce pain.  Using ice to reduce swelling.  Having medicine injected into your ankle joint to reduce pain and swelling.  Starting range-of-motion exercises and strengthening exercises.  Gradually returning to full activity. The timing will depend on the severity of your condition and your response to treatment.  Surgery. Follow these instructions at home: If you have a splint or boot:  Wear the splint or boot as told by your health care provider. Remove it only as told by your health care provider.  Loosen the splint or boot if your toes tingle, become numb, or turn cold and blue.  Keep the splint or boot clean.  If your splint or boot is not waterproof: ? Do not let it get wet. ? Cover it with a watertight covering when you take a bath or shower.  Ask your health care provider when it is safe to drive if your splint or boot is on a foot that you use for driving. Managing pain, stiffness, and swelling   If directed, put ice on the injured area. ? If you have a removable splint or boot, remove it as told by your health care provider. ? Put ice in a plastic bag. ? Place a towel between your skin and the bag. ? Leave the ice on for 20 minutes, 2-3 times a day.  Move your toes often to reduce stiffness and swelling.  Raise (elevate) your foot above the level of your heart while you are sitting or lying down. Activity  Return to your normal activities as told by your health care provider. Ask your health care provider what activities are safe for you.  Do exercises as told by your health care provider. General instructions  Take over-the-counter and prescription medicines only as  told by your health care provider.  Do not use any products that contain nicotine or tobacco, such as cigarettes, e-cigarettes, and chewing tobacco. These can delay healing. If you need help quitting, ask your health care provider.  Keep all follow-up visits as told by your health care provider. This is important. How is this prevented?  Give your body time to rest between periods of activity.  Make sure to wear supportive and comfortable shoes during athletic activity.  Do not overtighten ski boots or the laces on high-top shoes.  Be safe and responsible while being active to avoid falls. Contact a health care provider if:  Your ankle pain is  not getting better.  You are unable to support (bear) body weight on your foot without pain. Summary  Tarsal tunnel syndrome is a condition that happens when a nerve called the tibial nerve is irritated or squeezed (compressed) as it passes through an area on the inside of your ankle (tarsal tunnel).  Tarsal tunnel syndrome usually causes ankle and foot pain that gets worse with activity.  This condition may be treated with a splint or boot, shoe inserts, ice, exercises, medicines, and surgery if needed.  Follow your health care provider's instructions for caring for your splint or boot.  Contact your health care provider if your ankle pain is not getting better, or if you are unable to bear your body weight without pain. This information is not intended to replace advice given to you by your health care provider. Make sure you discuss any questions you have with your health care provider. Document Revised: 04/22/2018 Document Reviewed: 04/22/2018 Elsevier Patient Education  2020 [employer] and pharmacy about shingrix - it is a 2 part shot that we will not be getting in the office.   Suggest getting AFTER covid vaccines, have to wait at least a month This shot can make you feel bad due to such good immune response it  can trigger some inflammation so take tylenol or aleve day of or day after and plan on resting.   Can go to [email or web address] for more information  Shingrix Vaccination  Two vaccines are licensed and recommended to prevent shingles in the [place] Zoster vaccine live (ZVL, Zostavax) has been in use since 2006. Recombinant zoster vaccine (RZV, Shingrix), has been in use since 2017 and is recommended by [employer] as the preferred shingles vaccine.  What Everyone Should Know about Shingles Vaccine (Shingrix) One of the Recommended Vaccines by Disease Shingles vaccination is the only way to protect against shingles and postherpetic neuralgia (PHN), the most common complication from shingles. CDC recommends that healthy adults 50 years and older get two doses of the shingles vaccine called Shingrix (recombinant zoster vaccine), separated by 2 to 6 months, to prevent shingles and the complications from the disease. Your doctor or pharmacist can give you Shingrix as a shot in your upper arm. Shingrix provides strong protection against shingles and PHN. Two doses of Shingrix is more than 90% effective at preventing shingles and PHN. Protection stays above 85% for at least the first four years after you get vaccinated. Shingrix is the preferred vaccine, over Zostavax (zoster vaccine live), a shingles vaccine in use since 2006. Zostavax may still be used to prevent shingles in healthy adults 60 years and older. For example, you could use Zostavax if a person is allergic to Shingrix, prefers Zostavax, or requests immediate vaccination and Shingrix is unavailable. Who Should Get Shingrix? Healthy adults 50 years and older should get two doses of Shingrix, separated by 2 to 6 months. You should get Shingrix even if in the past you . had shingles  . received Zostavax  . are not sure if you had chickenpox There is no maximum age for getting Shingrix. If you had shingles  in the past, you can get Shingrix to help prevent future occurrences of the disease. There is no specific length of time that you need to wait after having shingles before you can receive Shingrix, but generally you should make sure the shingles rash has gone away before getting vaccinated. You can get Shingrix whether or not you remember having had chickenpox  in the past. Studies show that more than 99% of Americans 40 years and older have had chickenpox, even if they don't remember having the disease. Chickenpox and shingles are related because they are caused by the same virus (varicella zoster virus). After a person recovers from chickenpox, the virus stays dormant (inactive) in the body. It can reactivate years later and cause shingles. If you had Zostavax in the recent past, you should wait at least eight weeks before getting Shingrix. Talk to your healthcare provider to determine the best time to get Shingrix. Shingrix is available in [facility] and pharmacies. To find doctor's offices or pharmacies near you that offer the vaccine, visit [facility] Vaccine FinderExternal. If you have questions about Shingrix, talk with your healthcare provider. Vaccine for Those 83 Years and Older  Shingrix reduces the risk of shingles and PHN by more than 90% in people 64 and older. CDC recommends the vaccine for healthy adults 77 and older.  Who Should Not Get Shingrix? You should not get Shingrix if you: . have ever had a severe allergic reaction to any component of the vaccine or after a dose of Shingrix  . tested negative for immunity to varicella zoster virus. If you test negative, you should get chickenpox vaccine.  . currently have shingles  . currently are pregnant or breastfeeding. Women who are pregnant or breastfeeding should wait to get Shingrix.  Marland Kitchen receive specific antiviral drugs (acyclovir, famciclovir, or valacyclovir) 24 hours before vaccination (avoid use of these antiviral drugs for 14  days after vaccination)- zoster vaccine live only If you have a minor acute (starts suddenly) illness, such as a cold, you may get Shingrix. But if you have a moderate or severe acute illness, you should usually wait until you recover before getting the vaccine. This includes anyone with a temperature of 101.34F or higher. The side effects of the Shingrix are temporary, and usually last 2 to 3 days. While you may experience pain for a few days after getting Shingrix, the pain will be less severe than having shingles and the complications from the disease. How Well Does Shingrix Work? Two doses of Shingrix provides strong protection against shingles and postherpetic neuralgia (PHN), the most common complication of shingles. . In adults 54 to 68 years old who got two doses, Shingrix was 97% effective in preventing shingles; among adults 70 years and older, Shingrix was 91% effective.  . In adults 64 to 68 years old who got two doses, Shingrix was 91% effective in preventing PHN; among adults 70 years and older, Shingrix was 89% effective. Shingrix protection remained high (more than 85%) in people 70 years and older throughout the four years following vaccination. Since your risk of shingles and PHN increases as you get older, it is important to have strong protection against shingles in your older years. Top of Page  What Are the Possible Side Effects of Shingrix? Studies show that Shingrix is safe. The vaccine helps your body create a strong defense against shingles. As a result, you are likely to have temporary side effects from getting the shots. The side effects may affect your ability to do normal daily activities for 2 to 3 days. Most people got a sore arm with mild or moderate pain after getting Shingrix, and some also had redness and swelling where they got the shot. Some people felt tired, had muscle pain, a headache, shivering, fever, stomach pain, or nausea. About 1 out of 6 people who got  Shingrix experienced  side effects that prevented them from doing regular activities. Symptoms went away on their own in about 2 to 3 days. Side effects were more common in younger people. You might have a reaction to the first or second dose of Shingrix, or both doses. If you experience side effects, you may choose to take over-the-counter pain medicine such as ibuprofen or acetaminophen. If you experience side effects from Shingrix, you should report them to the Vaccine Adverse Event Reporting System (VAERS). Your doctor might file this report, or you can do it yourself through the VAERS websiteExternal, or by calling [phone]. If you have any questions about side effects from Shingrix, talk with your doctor. The shingles vaccine does not contain thimerosal (a preservative containing mercury). Top of Page  When Should I See a Doctor Because of the Side Effects I Experience From Shingrix? In clinical trials, Shingrix was not associated with serious adverse events. In fact, serious side effects from vaccines are extremely rare. For example, for every 1 million doses of a vaccine given, only one or two people may have a severe allergic reaction. Signs of an allergic reaction happen within minutes or hours after vaccination and include hives, swelling of the face and throat, difficulty breathing, a fast heartbeat, dizziness, or weakness. If you experience these or any other life-threatening symptoms, see a doctor right away. Shingrix causes a strong response in your immune system, so it may produce short-term side effects more intense than you are used to from other vaccines. These side effects can be uncomfortable, but they are expected and usually go away on their own in 2 or 3 days. Top of Page  How Can I Pay For Shingrix? There are several ways shingles vaccine may be paid for: Medicare . Medicare Part D plans cover the shingles vaccine, but there may be a cost to you depending on your plan.  There may be a copay for the vaccine, or you may need to pay in full then get reimbursed for a certain amount.  . Medicare Part B does not cover the shingles vaccine. Medicaid . Medicaid may or may not cover the vaccine. Contact your insurer to find out. Private health insurance . Many private health insurance plans will cover the vaccine, but there may be a cost to you depending on your plan. Contact your insurer to find out. Vaccine assistance programs . Some pharmaceutical companies provide vaccines to eligible adults who cannot afford them. You may want to check with the vaccine manufacturer, [employer], about Shingrix. If you do not currently have health insurance, learn more about affordable health coverage optionsExternal. To find doctor's offices or pharmacies near you that offer the vaccine, visit HealthMap Vaccine FinderExternal.

## 2019-11-22 LAB — COMPLETE METABOLIC PANEL WITH GFR
AG Ratio: 1.6 (calc) (ref 1.0–2.5)
ALT: 13 U/L (ref 6–29)
AST: 17 U/L (ref 10–35)
Albumin: 4.2 g/dL (ref 3.6–5.1)
Alkaline phosphatase (APISO): 84 U/L (ref 37–153)
BUN: 16 mg/dL (ref 7–25)
CO2: 30 mmol/L (ref 20–32)
Calcium: 9.8 mg/dL (ref 8.6–10.4)
Chloride: 105 mmol/L (ref 98–110)
Creat: 0.93 mg/dL (ref 0.50–0.99)
GFR, Est African American: 74 mL/min/{1.73_m2} (ref >=60)
GFR, Est Non African American: 64 mL/min/{1.73_m2} (ref >=60)
Globulin: 2.6 g/dL (calc) (ref 1.9–3.7)
Glucose, Bld: 90 mg/dL (ref 65–99)
Potassium: 4.7 mmol/L (ref 3.5–5.3)
Sodium: 141 mmol/L (ref 135–146)
Total Bilirubin: 0.3 mg/dL (ref 0.2–1.2)
Total Protein: 6.8 g/dL (ref 6.1–8.1)

## 2019-11-22 LAB — URINE CULTURE
MICRO NUMBER:: 10561545
Result:: NO GROWTH
SPECIMEN QUALITY:: ADEQUATE

## 2019-11-22 LAB — URINALYSIS, ROUTINE W REFLEX MICROSCOPIC
Bacteria, UA: NONE SEEN /HPF
Bilirubin Urine: NEGATIVE
Glucose, UA: NEGATIVE
Hgb urine dipstick: NEGATIVE
Hyaline Cast: NONE SEEN /LPF
Ketones, ur: NEGATIVE
Nitrite: NEGATIVE
Protein, ur: NEGATIVE
RBC / HPF: NONE SEEN /HPF (ref 0–2)
Specific Gravity, Urine: 1.006 (ref 1.001–1.03)
Squamous Epithelial / HPF: NONE SEEN /HPF (ref <=5)
WBC, UA: NONE SEEN /HPF (ref 0–5)
pH: 6 (ref 5.0–8.0)

## 2019-11-22 LAB — HEMOGLOBIN A1C
Hgb A1c MFr Bld: 5.5 % of total Hgb (ref <5.7)
Mean Plasma Glucose: 111 (calc)
eAG (mmol/L): 6.2 (calc)

## 2019-11-22 LAB — CBC WITH DIFFERENTIAL/PLATELET
Absolute Monocytes: 229 cells/uL (ref 200–950)
Basophils Absolute: 19 cells/uL (ref 0–200)
Basophils Relative: 0.5 %
Eosinophils Absolute: 81 cells/uL (ref 15–500)
Eosinophils Relative: 2.2 %
HCT: 37.7 % (ref 35.0–45.0)
Hemoglobin: 12.5 g/dL (ref 11.7–15.5)
Lymphs Abs: 1839 cells/uL (ref 850–3900)
MCH: 29.1 pg (ref 27.0–33.0)
MCHC: 33.2 g/dL (ref 32.0–36.0)
MCV: 87.9 fL (ref 80.0–100.0)
MPV: 11 fL (ref 7.5–12.5)
Monocytes Relative: 6.2 %
Neutro Abs: 1532 cells/uL (ref 1500–7800)
Neutrophils Relative %: 41.4 %
Platelets: 176 10*3/uL (ref 140–400)
RBC: 4.29 10*6/uL (ref 3.80–5.10)
RDW: 12.7 % (ref 11.0–15.0)
Total Lymphocyte: 49.7 %
WBC: 3.7 10*3/uL — ABNORMAL LOW (ref 3.8–10.8)

## 2019-11-22 LAB — LIPID PANEL
Cholesterol: 201 mg/dL — ABNORMAL HIGH (ref <200)
HDL: 75 mg/dL (ref >=50)
LDL Cholesterol (Calc): 112 mg/dL (calc) — ABNORMAL HIGH
Non-HDL Cholesterol (Calc): 126 mg/dL (calc) (ref <130)
Total CHOL/HDL Ratio: 2.7 (calc) (ref <5.0)
Triglycerides: 58 mg/dL (ref <150)

## 2019-11-22 LAB — TSH: TSH: 1.14 mIU/L (ref 0.40–4.50)

## 2019-12-09 ENCOUNTER — Ambulatory Visit: Payer: Medicare Other | Admitting: Podiatry

## 2019-12-09 ENCOUNTER — Ambulatory Visit: Payer: Medicare Other | Admitting: Physician Assistant

## 2019-12-14 ENCOUNTER — Other Ambulatory Visit: Payer: Self-pay

## 2019-12-14 ENCOUNTER — Ambulatory Visit: Payer: Medicare Other | Admitting: Podiatry

## 2019-12-14 DIAGNOSIS — L600 Ingrowing nail: Secondary | ICD-10-CM

## 2019-12-14 DIAGNOSIS — L603 Nail dystrophy: Secondary | ICD-10-CM

## 2019-12-14 NOTE — Patient Instructions (Signed)

## 2019-12-15 ENCOUNTER — Encounter: Payer: Self-pay | Admitting: Obstetrics and Gynecology

## 2019-12-15 ENCOUNTER — Ambulatory Visit: Payer: Medicare Other | Admitting: Obstetrics and Gynecology

## 2019-12-15 VITALS — BP 118/70

## 2019-12-15 DIAGNOSIS — R109 Unspecified abdominal pain: Secondary | ICD-10-CM

## 2019-12-15 HISTORY — PX: UPPER GI ENDOSCOPY: SHX6162

## 2019-12-15 NOTE — Progress Notes (Signed)
[name] 03/01/1951 [id number]  SUBJECTIVE:  69 y.o. G0P0000 female presents for lower abdominal/pelvic cramping x 6 weeks.  Went through menopause around age 29, no vaginal bleeding since then.  The cramping she is having is reminiscent of her menstrual cramps.  She is been having some radiation down into her upper thighs bilaterally in addition to her flanks.  No vaginal bleeding.  She was given a hyoscyamine by GI and took some recently and found it really reduce the cramping and leg discomfort.  Her cramps today started after having a bowel movement.  Intermittent cramps can also happen without relation to BM.  Mostly she has a BM daily, takes Miralax nightly.  History of IBS.  A bit of constipation in the last few weeks, also has not been quite as regular about the Miralax in the last few weeks.  No blood in stool or melena.  Stool normal to slightly loose but no diarrhea.  Also doing Benefiber, daily oatmeal, eating salads but no changes there and diet.  Also gassy and bloated but about the same as her usual.   Denies vaginal discharge.  No significant vaginal irritation.  No dysuria symptoms.  Current Outpatient Medications  Medication Sig Dispense Refill  . aspirin 81 MG tablet Take 81 mg by mouth daily.    . Cholecalciferol (D-3-5) 5000 UNITS capsule Take 5,000 Units by mouth daily.    . cyproheptadine (PERIACTIN) 4 MG tablet 1/2-1 tablet at bedtime 30 tablet 5  . ezetimibe (ZETIA) 10 MG tablet Take 1 tablet Daily for Cholesterol 90 tablet 3  . famotidine (PEPCID) 40 MG tablet Take 1 tablet by mouth once daily in the evening 30 tablet 5  . loratadine (CLARITIN) 10 MG tablet Take 1 tablet 1-2 times daily 60 tablet 3  . Lutein 20 MG TABS Take by mouth.    . Multiple Vitamins-Minerals (AIRBORNE) CHEW Chew 1 tablet by mouth as needed.    . Multiple Vitamins-Minerals (WOMENS MULTI VITAMIN & MINERAL PO) Take 1 tablet by mouth daily.    Marland Kitchen omeprazole (PRILOSEC) 40 MG capsule Take 1 capsule  by mouth twice daily 60 capsule 5  . OVER THE COUNTER MEDICATION Patient takes MOM as needed.    . RESTASIS 0.05 % ophthalmic emulsion instill 1 drop into both eyes twice a day  0  . Triamcinolone Acetonide (NASACORT AQ NA) Place into the nose.    . hyoscyamine (LEVBID) 0.375 MG 12 hr tablet Take by mouth.     No current facility-administered medications for this visit.   Allergies: Bactrim [sulfamethoxazole-trimethoprim]  No LMP recorded. Patient is postmenopausal.  Past medical history,surgical history, problem list, medications, allergies, family history and social history were all reviewed and documented as reviewed in the EPIC chart.  ROS:  Feeling well. No dyspnea or chest pain on exertion.  Bloating +change in bowel habits, no black or bloody stools.  No urinary tract symptoms. GYN ROS: As described above   OBJECTIVE:  BP 118/70  The patient appears well, alert, oriented x 3, in no distress. Abdomen: Lower abdomen nontender to palpation PELVIC EXAM: VULVA: normal appearing vulva with no masses, tenderness or lesions, VAGINA: normal appearing vagina with normal color and discharge, no lesions, CERVIX: normal appearing cervix without discharge or lesions, UTERUS: uterus is normal size, shape, consistency and nontender, ADNEXA: normal adnexa in size, nontender and no masses  Chaperone: [name] present during the examination  ASSESSMENT:  69 y.o. G0P0000 here for evaluation of lower abdominal  and pelvic cramping  PLAN:  Her symptoms sound to be more consistent with a GI etiology.  Her pelvic exam is reassuring and unremarkable today, I am further reassured by the fact she had a negative pelvic ultrasound barely 7 months ago with atrophic bilateral ovaries and a very small fibroid in the uterus that I feel would not be accounting for her cramping symptoms given the whole clinical picture.  No vaginal bleeding to su vaginal ggest a uterine issue.  Also, the hyoscyamine seem to really  help her symptoms so this makes me think even more that this would be a GI issue that she has been experiencing.  If she does develop any bleeding I want her to return for evaluation.  Otherwise, if she is not having any relief of her symptoms I would encourage her to see GI for further evaluation and recommendations.   Joseph Pierini MD 12/15/19

## 2019-12-26 NOTE — Progress Notes (Signed)
Subjective: 69 year old female presents the office today for concerns of ingrown toenails of the right fourth toe.  She is unsure if she wants to have the procedure done and she is nervous to do so.  She has been soaking Epson salts.  Denies any drainage or pus. Denies any systemic complaints such as fevers, chills, nausea, vomiting. No acute changes since last appointment, and no other complaints at this time.   Objective: AAO x3, NAD DP/PT pulses palpable bilaterally, CRT less than 3 seconds The nail on the right fourth is dystrophic, discolored with brown discoloration is hypertrophic.  There is incurvation present the nail border and there is tenderness to palpation to the entire nail this time.  There is no drainage or pus or swelling or redness. No open lesions or pre-ulcerative lesions.  No pain with calf compression, swelling, warmth, erythema  Assessment: 69 year old female symptomatic ingrown toenail right fourth toe, onychodystrophy  Plan: -All treatment options discussed with the patient including all alternatives, risks, complications.  -We discussed today partial versus total nail removal.  Discussed the procedure as well as postoperative course.  She was very hesitant to do this today.  She wants to reschedule and have this done as she has upcoming events. -Patient encouraged to call the office with any questions, concerns, change in symptoms.   Trula Slade DPM

## 2019-12-30 ENCOUNTER — Emergency Department (HOSPITAL_BASED_OUTPATIENT_CLINIC_OR_DEPARTMENT_OTHER)
Admission: EM | Admit: 2019-12-30 | Discharge: 2019-12-30 | Disposition: A | Discharge status: Home / Self Care | Payer: Medicare Other | Attending: Emergency Medicine | Admitting: Emergency Medicine

## 2019-12-30 ENCOUNTER — Encounter (HOSPITAL_BASED_OUTPATIENT_CLINIC_OR_DEPARTMENT_OTHER): Payer: Self-pay | Admitting: Emergency Medicine

## 2019-12-30 ENCOUNTER — Other Ambulatory Visit: Payer: Self-pay

## 2019-12-30 DIAGNOSIS — R202 Paresthesia of skin: Secondary | ICD-10-CM | POA: Insufficient documentation

## 2019-12-30 DIAGNOSIS — Z7982 Long term (current) use of aspirin: Secondary | ICD-10-CM | POA: Diagnosis not present

## 2019-12-30 LAB — CBC WITH DIFFERENTIAL/PLATELET
Abs Immature Granulocytes: 0.01 10*3/uL (ref 0.00–0.07)
Basophils Absolute: 0 10*3/uL (ref 0.0–0.1)
Basophils Relative: 1 %
Eosinophils Absolute: 0.1 10*3/uL (ref 0.0–0.5)
Eosinophils Relative: 3 %
HCT: 36.4 % (ref 36.0–46.0)
Hemoglobin: 12 g/dL (ref 12.0–15.0)
Immature Granulocytes: 0 %
Lymphocytes Relative: 52 %
Lymphs Abs: 1.8 10*3/uL (ref 0.7–4.0)
MCH: 28.7 pg (ref 26.0–34.0)
MCHC: 33 g/dL (ref 30.0–36.0)
MCV: 87.1 fL (ref 80.0–100.0)
Monocytes Absolute: 0.4 10*3/uL (ref 0.1–1.0)
Monocytes Relative: 10 %
Neutro Abs: 1.2 10*3/uL — ABNORMAL LOW (ref 1.7–7.7)
Neutrophils Relative %: 34 %
Platelets: 162 10*3/uL (ref 150–400)
RBC: 4.18 MIL/uL (ref 3.87–5.11)
RDW: 12.4 % (ref 11.5–15.5)
WBC: 3.5 10*3/uL — ABNORMAL LOW (ref 4.0–10.5)
nRBC: 0 % (ref 0.0–0.2)

## 2019-12-30 LAB — BASIC METABOLIC PANEL
Anion gap: 9 (ref 5–15)
BUN: 16 mg/dL (ref 8–23)
CO2: 26 mmol/L (ref 22–32)
Calcium: 9.1 mg/dL (ref 8.9–10.3)
Chloride: 105 mmol/L (ref 98–111)
Creatinine, Ser: 0.78 mg/dL (ref 0.44–1.00)
GFR calc Af Amer: >60 mL/min (ref >60)
GFR calc non Af Amer: >60 mL/min (ref >60)
Glucose, Bld: 98 mg/dL (ref 70–99)
Potassium: 4.3 mmol/L (ref 3.5–5.1)
Sodium: 140 mmol/L (ref 135–145)

## 2019-12-30 LAB — MAGNESIUM: Magnesium: 2.2 mg/dL (ref 1.7–2.4)

## 2019-12-30 NOTE — ED Triage Notes (Signed)
Pt states she woke up around 230 this morning and had tingling in her right foot  Pt states the tingling is moving up her leg  Pt states it is a little bit painful  Pt states she has a slight headache on the right side as well

## 2019-12-30 NOTE — ED Provider Notes (Signed)
[facility] Provider Note: Georgena Spurling, MD, FACEP  CSN: [id number] MRN: [id number] ARRIVAL: 12/30/19 at [place]: [place]  Right Foot Tingling   HISTORY OF PRESENT ILLNESS  12/30/19 4:46 AM [name] is a 69 y.o. female with a history of chronic back pain.  She woke up at 230 this morning with paresthesias of her right lateral foot, lower leg and right anterior lateral thigh.  The paresthesias are not severe and she rates them as a 2 out of 10, described as a tingling, and is now improving.  Nothing makes the symptoms better or worse.  Specifically, movement of her lower back does not change the symptoms.  She has no associated muscle weakness.  She has noted no bowel or bladder changes.  She has had an associated mild headache above her right eye which is improving.  She received her Covid vaccinations in January of this year.   Past Medical History:  Diagnosis Date  . Arthritis    cervical and lumbar  . Depression   . GERD (gastroesophageal reflux disease)   . Hyperlipidemia   . IBS (irritable bowel syndrome)   . Prediabetes   . Vitamin D deficiency     Past Surgical History:  Procedure Laterality Date  . CARDIAC CATHETERIZATION  2011   normal (Dr. Terrence Dupont)  . RADIOACTIVE SEED GUIDED EXCISIONAL BREAST BIOPSY Right 03/11/2016   Procedure: RIGHT RADIOACTIVE SEED GUIDED EXCISIONAL BREAST BIOPSY;  Surgeon: Rolm Bookbinder, MD;  Location: [facility];  Service: General;  Laterality: Right;    Family History  Problem Relation Age of Onset  . Hypertension Mother   . Other Mother        surgeries involving upper back  . Diabetes Father   . Heart failure Father   . Cancer Maternal Grandmother        Lung  . Allergic rhinitis Neg Hx   . Angioedema Neg Hx   . Asthma Neg Hx   . Atopy Neg Hx   . Eczema Neg Hx   . Immunodeficiency Neg Hx   . Urticaria Neg Hx     Social History   Tobacco Use  . Smoking status: Never  Smoker  . Smokeless tobacco: Never Used  Vaping Use  . Vaping Use: Never used  Substance Use Topics  . Alcohol use: Yes    Comment: Rare  . Drug use: No    Prior to Admission medications   Medication Sig Start Date End Date Taking? Authorizing Provider  aspirin 81 MG tablet Take 81 mg by mouth daily.    [provider]  Cholecalciferol (D-3-5) 5000 UNITS capsule Take 5,000 Units by mouth daily.    [provider]  cyproheptadine (PERIACTIN) 4 MG tablet 1/2-1 tablet at bedtime 09/13/19   Kozlow, Donnamarie Poag, MD  ezetimibe (ZETIA) 10 MG tablet Take 1 tablet Daily for Cholesterol 02/17/19   Unk Pinto, MD  famotidine (PEPCID) 40 MG tablet Take 1 tablet by mouth once daily in the evening 08/16/19   Kozlow, Donnamarie Poag, MD  hyoscyamine (LEVBID) 0.375 MG 12 hr tablet Take by mouth. 09/09/19 10/09/19  [provider]  loratadine (CLARITIN) 10 MG tablet Take 1 tablet 1-2 times daily 08/25/19   Kozlow, Donnamarie Poag, MD  Lutein 20 MG TABS Take by mouth.    [provider]  Multiple Vitamins-Minerals (AIRBORNE) CHEW Chew 1 tablet by mouth as needed.    [provider]  Multiple Vitamins-Minerals (  WOMENS MULTI VITAMIN & MINERAL PO) Take 1 tablet by mouth daily.    [provider]  omeprazole (PRILOSEC) 40 MG capsule Take 1 capsule by mouth twice daily 08/16/19   Kozlow, Donnamarie Poag, MD  OVER THE COUNTER MEDICATION Patient takes MOM as needed.    [provider]  RESTASIS 0.05 % ophthalmic emulsion instill 1 drop into both eyes twice a day 11/08/14   [provider]  Triamcinolone Acetonide (NASACORT AQ NA) Place into the nose.    [provider]    Allergies Bactrim [sulfamethoxazole-trimethoprim]   REVIEW OF SYSTEMS  Negative except as noted here or in the History of Present Illness.   PHYSICAL EXAMINATION  Initial Vital Signs Blood pressure 107/63, pulse (!) 54, temperature 97.8 F (36.6 C), temperature source Oral, resp. rate 14,  height 5' 4.75" (1.645 m), weight 64 kg, SpO2 100 %.  Examination General: Well-developed, well-nourished female in no acute distress; appearance consistent with age of record HENT: normocephalic; atraumatic Eyes: pupils equal, round and reactive to light; extraocular muscles intact Neck: supple Heart: regular rate and rhythm Lungs: clear to auscultation bilaterally Abdomen: soft; nondistended; nontender; bowel sounds present Extremities: No deformity; full range of motion; pulses normal Neurologic: Awake, alert and oriented; motor function intact in all extremities and symmetric; normal patellar DTRs; sensation intact and symmetric in the lower extremities; no facial droop Skin: Warm and dry Psychiatric: Normal mood and affect   RESULTS  Summary of this visit's results, reviewed and interpreted by myself:   EKG Interpretation  Date/Time:    Ventricular Rate:    PR Interval:    QRS Duration:   QT Interval:    QTC Calculation:   R Axis:     Text Interpretation:        Laboratory Studies: Results for orders placed or performed during the hospital encounter of 12/30/19 (from the past 24 hour(s))  CBC with Differential/Platelet     Status: Abnormal   Collection Time: 12/30/19  5:22 AM  Result Value Ref Range   WBC 3.5 (L) 4.0 - 10.5 K/uL   RBC 4.18 3.87 - 5.11 MIL/uL   Hemoglobin 12.0 12.0 - 15.0 g/dL   HCT 36.4 36 - 46 %   MCV 87.1 80.0 - 100.0 fL   MCH 28.7 26.0 - 34.0 pg   MCHC 33.0 30.0 - 36.0 g/dL   RDW 12.4 11.5 - 15.5 %   Platelets 162 150 - 400 K/uL   nRBC 0.0 0.0 - 0.2 %   Neutrophils Relative % 34 %   Neutro Abs 1.2 (L) 1.7 - 7.7 K/uL   Lymphocytes Relative 52 %   Lymphs Abs 1.8 0.7 - 4.0 K/uL   Monocytes Relative 10 %   Monocytes Absolute 0.4 0 - 1 K/uL   Eosinophils Relative 3 %   Eosinophils Absolute 0.1 0 - 0 K/uL   Basophils Relative 1 %   Basophils Absolute 0.0 0 - 0 K/uL   Immature Granulocytes 0 %   Abs Immature Granulocytes 0.01 0.00 - 0.07  K/uL  Basic metabolic panel     Status: None   Collection Time: 12/30/19  5:22 AM  Result Value Ref Range   Sodium 140 135 - 145 mmol/L   Potassium 4.3 3.5 - 5.1 mmol/L   Chloride 105 98 - 111 mmol/L   CO2 26 22 - 32 mmol/L   Glucose, Bld 98 70 - 99 mg/dL   BUN 16 8 - 23 mg/dL   Creatinine, Ser  0.78 0.44 - 1.00 mg/dL   Calcium 9.1 8.9 - 10.3 mg/dL   GFR calc non Af Amer >60 >60 mL/min   GFR calc Af Amer >60 >60 mL/min   Anion gap 9 5 - 15  Magnesium     Status: None   Collection Time: 12/30/19  5:22 AM  Result Value Ref Range   Magnesium 2.2 1.7 - 2.4 mg/dL   Imaging Studies: No results found.  ED COURSE and MDM  Nursing notes, initial and subsequent vitals signs, including pulse oximetry, reviewed and interpreted by myself.  Vitals:   12/30/19 0438 12/30/19 0441  BP:  107/63  Pulse:  (!) 54  Resp:  14  Temp:  97.8 F (36.6 C)  TempSrc:  Oral  SpO2:  100%  Weight: 64 kg   Height: 5' 4.75" (1.645 m)    Medications - No data to display  The patient's symptoms are more consistent with a dermatomal pattern than a stocking glove pattern.  This suggests a lumbar spinal etiology, especially given her history of chronic back pain.  Her neurologic exam is normal with no evidence of stroke.  Sharlyn Bologna is a possibility but the pattern of paresthesias is atypical for get MRA and she has not recently had the Covid vaccine.  She was advised to be observant for any new or worsening symptoms such as increasing numbness, pain or especially weakness or visual changes and to return if any of these occur.  PROCEDURES  Procedures   ED DIAGNOSES     ICD-10-CM   1. Right leg paresthesias  R20.2        Bria Sparr, Jenny Reichmann, MD 12/30/19 [phone]

## 2020-01-03 ENCOUNTER — Ambulatory Visit: Payer: Medicare Other | Admitting: Podiatry

## 2020-01-03 ENCOUNTER — Encounter: Payer: Self-pay | Admitting: Podiatry

## 2020-01-03 ENCOUNTER — Other Ambulatory Visit: Payer: Self-pay

## 2020-01-03 DIAGNOSIS — M79674 Pain in right toe(s): Secondary | ICD-10-CM | POA: Diagnosis not present

## 2020-01-03 DIAGNOSIS — L600 Ingrowing nail: Secondary | ICD-10-CM | POA: Diagnosis not present

## 2020-01-03 DIAGNOSIS — B351 Tinea unguium: Secondary | ICD-10-CM | POA: Diagnosis not present

## 2020-01-03 NOTE — Patient Instructions (Addendum)

## 2020-01-04 NOTE — Progress Notes (Signed)
Subjective: 69 year old female presents the office to have her right fourth toenail removed.  She said that she started have some finger left third tenderness.  This looked at as well.  The right fourth toe has become tender over time without talking several occasions have this removed and she was waiting proceed with this.. Denies any systemic complaints such as fevers, chills, nausea, vomiting. No acute changes since last appointment, and no other complaints at this time.   Objective: AAO x3, NAD DP/PT pulses palpable bilaterally, CRT less than 3 seconds In general the nails are hypertrophic, dystrophic with brown discoloration most notably the right fourth toenail.  It is along also causing irritation of the distal portion of the nail.  There is tenderness the entire toenail right fourth toe.  No pain the third toe today.  Incurvation present to medial lateral aspect of right fourth toenail.  No open lesions or pre-ulcerative lesions.  No pain with calf compression, swelling, warmth, erythema  Assessment: Right fourth toenail symptomatic ingrown toenail, onychomycosis  Plan: -All treatment options discussed with the patient including all alternatives, risks, complications.  -At this time, the patient is requesting total nail removal with chemical matricectomy to the symptomatic portion of the nail. Risks and complications were discussed with the patient for which they understand and written consent was obtained. Under sterile conditions a total of 3 mL of a mixture of 2% lidocaine plain and 0.5% Marcaine plain was infiltrated in a hallux block fashion. Once anesthetized, the skin was prepped in sterile fashion. A tourniquet was then applied. Next the right 4th nail was then sharply excised making sure to remove the entire offending nail border. Once the nails were ensured to be removed area was debrided and the underlying skin was intact. There is no purulence identified in the procedure. Next  phenol was then applied under standard conditions and copiously irrigated. Silvadene was applied. A dry sterile dressing was applied. After application of the dressing the tourniquet was removed and there is found to be an immediate capillary refill time to the digit. The patient tolerated the procedure well any complications. Post procedure instructions were discussed the patient for which he verbally understood. Follow-up in one week for nail check or sooner if any problems are to arise. Discussed signs/symptoms of infection and directed to call the office immediately should any occur or go directly to the emergency room. In the meantime, encouraged to call the office with any questions, concerns, changes symptoms. -We will monitor the third toenail for now. -Patient encouraged to call the office with any questions, concerns, change in symptoms.   Trula Slade DPM

## 2020-01-10 ENCOUNTER — Telehealth: Payer: Self-pay | Admitting: *Deleted

## 2020-01-10 NOTE — Telephone Encounter (Signed)
Pt states she has a flat rash on the 4th toe between the 5th toe.

## 2020-01-10 NOTE — Telephone Encounter (Signed)
Left message telling pt she could send me an email or if she liked she could MyChart a message to Dr. Jacqualyn Posey with a photo. Left 2nd message immediately after the first to see if pt may answer and to leave email address.

## 2020-01-11 ENCOUNTER — Encounter: Payer: Self-pay | Admitting: Internal Medicine

## 2020-01-13 ENCOUNTER — Other Ambulatory Visit: Payer: Self-pay

## 2020-01-13 ENCOUNTER — Ambulatory Visit: Payer: Medicare Other | Admitting: Podiatry

## 2020-01-13 DIAGNOSIS — L603 Nail dystrophy: Secondary | ICD-10-CM

## 2020-01-13 DIAGNOSIS — L600 Ingrowing nail: Secondary | ICD-10-CM

## 2020-01-13 MED ORDER — CEPHALEXIN 500 MG PO CAPS: 500.0000 mg | ORAL_CAPSULE | Freq: Three times a day (TID) | ORAL | 0 refills | Status: DC

## 2020-01-17 NOTE — Progress Notes (Signed)
Subjective: [name] is a 69 y.o.  female returns to office today for follow up evaluation after having right 4th nail avulsion performed. Patient has been soaking using epsom salts and applying topical antibiotic covered with bandaid daily.  She did develop a rash on top of her foot since I last saw her after the appointment.  There is proximal to the MPJ.  This has been dissipating and not causing any open sores or itching.  Patient denies fevers, chills, nausea, vomiting. Denies any calf pain, chest pain, SOB.   Objective:  General: Well developed, nourished, in no acute distress, alert and oriented x3   Dermatology: Skin is warm, dry and supple bilateral.  Right fourth nail bed appears to be clean, dry, with mild granular tissue and surrounding scab from there is localized edema to the area and faint erythema there is no ascending cellulitis there is no drainage or pus identified today and minimal tenderness palpation.  There is no rash which is flat proximal to the MPJ and appears to be dissipating.  No open sores or blistering.  Neurovascular status: Intact. No lower extremity swelling; No pain with calf compression bilateral.  Musculoskeletal: Decreased tenderness to palpation of the right fourth nail bed. Muscular strength within normal limits bilateral.   Assesement and Plan: S/p partial nail avulsion, doing well.   -Continue soaking in epsom salts twice a day followed by antibiotic ointment and a band-aid. Can leave uncovered at night. Continue this until completely healed.  -Due to the edema I did prescribe Keflex. -Monitor for any signs/symptoms of infection. Call the office immediately if any occur or go directly to the emergency room. Call with any questions/concerns.  Celesta Gentile, DPM

## 2020-01-27 ENCOUNTER — Encounter (HOSPITAL_BASED_OUTPATIENT_CLINIC_OR_DEPARTMENT_OTHER): Payer: Self-pay

## 2020-01-27 ENCOUNTER — Telehealth: Payer: Self-pay | Admitting: Internal Medicine

## 2020-01-27 ENCOUNTER — Emergency Department (HOSPITAL_BASED_OUTPATIENT_CLINIC_OR_DEPARTMENT_OTHER): Payer: Medicare Other

## 2020-01-27 ENCOUNTER — Other Ambulatory Visit: Payer: Self-pay

## 2020-01-27 ENCOUNTER — Emergency Department (HOSPITAL_BASED_OUTPATIENT_CLINIC_OR_DEPARTMENT_OTHER)
Admission: EM | Admit: 2020-01-27 | Discharge: 2020-01-27 | Disposition: A | Discharge status: Home / Self Care | Payer: Medicare Other | Attending: Emergency Medicine | Admitting: Emergency Medicine

## 2020-01-27 DIAGNOSIS — R072 Precordial pain: Secondary | ICD-10-CM | POA: Insufficient documentation

## 2020-01-27 DIAGNOSIS — R002 Palpitations: Secondary | ICD-10-CM | POA: Diagnosis not present

## 2020-01-27 DIAGNOSIS — Z7982 Long term (current) use of aspirin: Secondary | ICD-10-CM | POA: Insufficient documentation

## 2020-01-27 DIAGNOSIS — M545 Low back pain: Secondary | ICD-10-CM | POA: Insufficient documentation

## 2020-01-27 DIAGNOSIS — R079 Chest pain, unspecified: Secondary | ICD-10-CM

## 2020-01-27 LAB — BASIC METABOLIC PANEL
Anion gap: 9 (ref 5–15)
BUN: 14 mg/dL (ref 8–23)
CO2: 27 mmol/L (ref 22–32)
Calcium: 9.3 mg/dL (ref 8.9–10.3)
Chloride: 102 mmol/L (ref 98–111)
Creatinine, Ser: 0.78 mg/dL (ref 0.44–1.00)
GFR calc Af Amer: >60 mL/min (ref >60)
GFR calc non Af Amer: >60 mL/min (ref >60)
Glucose, Bld: 92 mg/dL (ref 70–99)
Potassium: 4.6 mmol/L (ref 3.5–5.1)
Sodium: 138 mmol/L (ref 135–145)

## 2020-01-27 LAB — CBC
HCT: 39 % (ref 36.0–46.0)
Hemoglobin: 12.6 g/dL (ref 12.0–15.0)
MCH: 28.5 pg (ref 26.0–34.0)
MCHC: 32.3 g/dL (ref 30.0–36.0)
MCV: 88.2 fL (ref 80.0–100.0)
Platelets: 169 10*3/uL (ref 150–400)
RBC: 4.42 MIL/uL (ref 3.87–5.11)
RDW: 12.3 % (ref 11.5–15.5)
WBC: 3.4 10*3/uL — ABNORMAL LOW (ref 4.0–10.5)
nRBC: 0 % (ref 0.0–0.2)

## 2020-01-27 LAB — TROPONIN I (HIGH SENSITIVITY): Troponin I (High Sensitivity): <2 ng/L (ref <18)

## 2020-01-27 MED ORDER — KETOROLAC TROMETHAMINE 60 MG/2ML IM SOLN
60.0000 mg | Freq: Once | INTRAMUSCULAR | Status: AC
  Administered 2020-01-27: 60 mg via INTRAMUSCULAR
  Filled 2020-01-27: qty 2

## 2020-01-27 MED ORDER — CYCLOBENZAPRINE HCL 10 MG PO TABS
10.0000 mg | ORAL_TABLET | Freq: Two times a day (BID) | ORAL | 0 refills | Status: DC | PRN
Start: 2020-01-27 — End: 2020-08-27

## 2020-01-27 NOTE — Telephone Encounter (Signed)
patient called after hours with c/o sharp "scissor"  pain for 6 days  in upper sternum, moves to left, and in back, sweating, No SOB, Per Dr Unk Pinto, go to ER to be evaluated.

## 2020-01-27 NOTE — ED Triage Notes (Signed)
Pt c/o central/epigastric pain x 5 days-NAD-steady gait

## 2020-01-27 NOTE — ED Provider Notes (Signed)
[facility] EMERGENCY DEPARTMENT Provider Note   CSN: [id number] Arrival date & time: 01/27/20  1352     History Chief Complaint  Patient presents with  . Chest Pain    [name] is a 69 y.o. female with presents for chest pain for the past 1 days. Describes the pain as a sharp midsternal pain radiating up to her collarbones, left side, and under her left shoulder blade. Currenlty not in chest pain with last episode occuring about a 3 hours ago. The pain last between 30 minutes to and hour and improves with aspirin, and worsens after laying down of the left side. 2 days ago she also started to noticed a fluttering sensation in the mid chest. Called her PCP who recommended evaluation in the ED. She has hot flashes since menopause and notes several episodes and does not feel she has had episodes associated with the chest pain. State she has had similar chest pain and flutter in the past that was attributed to PVCs. She denies fever, chills, cough, SOB, nausea, vomiting, abdominal pain, diarrhea, lightheadedness, headache or weakness.     Past Medical History:  Diagnosis Date  . Arthritis    cervical and lumbar  . Depression   . GERD (gastroesophageal reflux disease)   . Hyperlipidemia   . IBS (irritable bowel syndrome)   . Prediabetes   . Vitamin D deficiency     Patient Active Problem List   Diagnosis Date Noted  . Fear of side effects of medication 08/28/2019  . Upper airway cough syndrome 05/04/2019  . DOE (dyspnea on exertion) 05/03/2019  . 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 Osteoporosis 08/11/2017  . Abnormal glucose 06/25/2017  . Medication management 12/12/2014  . 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 GERD (gastroesophageal reflux disease)   . Vitamin D deficiency   . Hyperlipidemia, mixed     Past Surgical History:  Procedure Laterality Date  . CARDIAC CATHETERIZATION  2011   normal (Dr. Terrence Dupont)  . RADIOACTIVE SEED GUIDED EXCISIONAL BREAST BIOPSY Right 03/11/2016    Procedure: RIGHT RADIOACTIVE SEED GUIDED EXCISIONAL BREAST BIOPSY;  Surgeon: Rolm Bookbinder, MD;  Location: [facility];  Service: General;  Laterality: Right;     OB History    Gravida  0   Para  0   Term  0   Preterm  0   AB  0   Living  0     SAB  0   TAB  0   Ectopic  0   Multiple  0   Live Births  0           Family History  Problem Relation Age of Onset  . Hypertension Mother   . Other Mother        surgeries involving upper back  . Diabetes Father   . Heart failure Father   . Cancer Maternal Grandmother        Lung  . Allergic rhinitis Neg Hx   . Angioedema Neg Hx   . Asthma Neg Hx   . Atopy Neg Hx   . Eczema Neg Hx   . Immunodeficiency Neg Hx   . Urticaria Neg Hx     Social History   Tobacco Use  . Smoking status: Never Smoker  . Smokeless tobacco: Never Used  Vaping Use  . Vaping Use: Never used  Substance Use Topics  . Alcohol use: Yes    Comment: Rare  . Drug use: No    Home Medications  Prior to Admission medications   Medication Sig Start Date End Date Taking? Authorizing Provider  aspirin 81 MG tablet Take 81 mg by mouth daily.    [provider]  cephALEXin (KEFLEX) 500 MG capsule Take 1 capsule (500 mg total) by mouth 3 (three) times daily. 01/13/20   Trula Slade, DPM  Cholecalciferol (D-3-5) 5000 UNITS capsule Take 5,000 Units by mouth daily.    [provider]  cyproheptadine (PERIACTIN) 4 MG tablet 1/2-1 tablet at bedtime 09/13/19   Kozlow, Donnamarie Poag, MD  ezetimibe (ZETIA) 10 MG tablet Take 1 tablet Daily for Cholesterol 02/17/19   Unk Pinto, MD  famotidine (PEPCID) 40 MG tablet Take 1 tablet by mouth once daily in the evening 08/16/19   Kozlow, Donnamarie Poag, MD  hyoscyamine (LEVBID) 0.375 MG 12 hr tablet Take by mouth. 09/09/19 10/09/19  [provider]  loratadine (CLARITIN) 10 MG tablet Take 1 tablet 1-2 times daily 08/25/19   Kozlow, Donnamarie Poag, MD  Lutein 20 MG TABS Take by mouth.     [provider]  Multiple Vitamins-Minerals (AIRBORNE) CHEW Chew 1 tablet by mouth as needed.    [provider]  Multiple Vitamins-Minerals (WOMENS MULTI VITAMIN & MINERAL PO) Take 1 tablet by mouth daily.    [provider]  omeprazole (PRILOSEC) 40 MG capsule Take 1 capsule by mouth twice daily 08/16/19   Kozlow, Donnamarie Poag, MD  OVER THE COUNTER MEDICATION Patient takes MOM as needed.    [provider]  RESTASIS 0.05 % ophthalmic emulsion instill 1 drop into both eyes twice a day 11/08/14   [provider]  Triamcinolone Acetonide (NASACORT AQ NA) Place into the nose.    [provider]    Allergies    Bactrim [sulfamethoxazole-trimethoprim]  Review of Systems   Review of Systems  Constitutional: Negative for chills and fever.  HENT: Negative for congestion and sinus pain.   Eyes: Negative for pain and discharge.  Respiratory: Negative for cough, shortness of breath and wheezing.   Cardiovascular: Positive for chest pain and palpitations. Negative for leg swelling.  Gastrointestinal: Negative for abdominal pain, diarrhea, nausea and vomiting.  Endocrine: Negative for polydipsia and polyphagia.  Genitourinary: Negative for dysuria and frequency.  Musculoskeletal: Positive for back pain. Negative for arthralgias and myalgias.  Skin: Negative for rash and wound.  Neurological: Negative for dizziness, weakness and headaches.  Psychiatric/Behavioral: Negative for agitation and confusion.  All other systems reviewed and are negative.   Physical Exam Updated Vital Signs BP 122/72 (BP Location: Right Arm)   Pulse 63   Temp 98 F (36.7 C) (Oral)   Resp 19   Wt 65.8 kg   SpO2 100%   BMI 24.32 kg/m   Physical Exam Vitals reviewed.  Constitutional:      General: She is not in acute distress.    Appearance: She is well-developed.  HENT:     Head: Normocephalic and atraumatic.  Eyes:     Extraocular Movements: Extraocular  movements intact.     Pupils: Pupils are equal, round, and reactive to light.  Cardiovascular:     Rate and Rhythm: Normal rate and regular rhythm.     Pulses:          Radial pulses are 2+ on the right side and 2+ on the left side.     Heart sounds: Normal heart sounds. No murmur heard.  No friction rub. No gallop.   Pulmonary:     Effort: Pulmonary effort  is normal.     Breath sounds: Normal breath sounds.  Chest:     Comments: Slight pain to palpation of the midsternal area Abdominal:     General: Bowel sounds are normal. There is no abdominal bruit.     Palpations: Abdomen is soft.  Musculoskeletal:        General: Normal range of motion.     Cervical back: Normal range of motion and neck supple.     Right lower leg: No edema.     Left lower leg: No edema.  Skin:    General: Skin is warm and dry.     Capillary Refill: Capillary refill takes less than 2 seconds.  Neurological:     General: No focal deficit present.     Mental Status: She is alert and oriented to person, place, and time.  Psychiatric:        Mood and Affect: Mood normal.        Behavior: Behavior normal.     ED Results / Procedures / Treatments   Labs (all labs ordered are listed, but only abnormal results are displayed) Labs Reviewed  CBC - Abnormal; Notable for the following components:      Result Value   WBC 3.4 (*)    All other components within normal limits  BASIC METABOLIC PANEL  TROPONIN I (HIGH SENSITIVITY)  TROPONIN I (HIGH SENSITIVITY)    EKG EKG Interpretation  Date/Time:  Friday January 27 2020 13:59:28 EDT Ventricular Rate:  58 PR Interval:  138 QRS Duration: 74 QT Interval:  394 QTC Calculation: 386 R Axis:   58 Text Interpretation: Sinus bradycardia Otherwise normal ECG Prior EKG showed bigeminy, not present on current EKG Confirmed by Dorie Rank [phone] on 01/27/2020 2:04:41 PM Also confirmed by Gareth Morgan [phone])  on 01/27/2020 4:52:05 PM   Radiology DG Chest 2  View  Result Date: 01/27/2020 CLINICAL DATA:  Chest pain EXAM: CHEST - 2 VIEW COMPARISON:  May 03, 2019 FINDINGS: The lungs are clear. The heart size and pulmonary vascularity are normal. No adenopathy. No pneumothorax. No bone lesions. IMPRESSION: Lungs clear.  Cardiac silhouette within normal limits. Electronically Signed   By: Lowella Grip III M.D.   On: 01/27/2020 14:26    Procedures Procedures (including critical care time)  Medications Ordered in ED Medications - No data to display  ED Course  I have reviewed the triage vital signs and the nursing notes.  Pertinent labs & imaging results that were available during my care of the patient were reviewed by me and considered in my medical decision making (see chart for details).    MDM Rules/Calculators/A&P                         69 y/o female presenting for 5 days of chest pain radiating to her left shoulder and left side. Was not in chest pain on exam, but was able to elicit pain with bending over. She has stable vitals with equal pulses and BP in bilateral upper extremities. Labs unremarkable with negative troponin. EKG and CXR unremarkable. Pain likely musculoskeletal in nature low suspicion for ACS or dissection. Discharged home with muscle relaxer and follow up with PCP. Final Clinical Impression(s) / ED Diagnoses Final diagnoses:  None    Rx / DC Orders ED Discharge Orders    None       Iona Beard, MD 01/27/20 [place], MD 01/30/20 2302

## 2020-01-29 NOTE — Progress Notes (Signed)
Subjective:    Patient ID: [name], female    DOB: 12/21/1950, 69 y.o.   MRN: [id number]  HPI 69 y.o. AAF with GERD, chol, HTN, anxiety presents with sharp sternum pain and sweating x 6 days.   She did go to the ER on 08/13 for chest pain, was able to elicit pain with bending over. She has stable vitals with equal pulses and BP in bilateral upper extremities. Labs unremarkable with negative troponin. EKG and CXR unremarkable. Toradol, not better.   She would wake up with chest pain, left rib pain, into her left shoulder blade. Felt like "scissors", felt pain was very random. Not associated with food, not assoicated with position. She did have sharp chest pain in the morning.  She has some neck/upper back pain. She has had some left arm pain intermittently.   No fever, states she is constantly cold. No SOB, no dizziness. She has had some constipation. Some nausea very rare, no vomiting.   Preventative care: Last colonoscopy: 10/2018 [facility] did not have EGD Last mammogram: 01/2019 Last pap smear/pelvic exam: 04/06/2017 DEXA: 07/2017 CXR 12/2016 MRI brain 2014 CT AB 2015 Echo 03/2018 Blood pressure 116/68, pulse 76, temperature 97.7 F (36.5 C), weight 144 lb (65.3 kg), SpO2 96 %.  Medications   Current Outpatient Medications (Cardiovascular):    ezetimibe (ZETIA) 10 MG tablet, Take 1 tablet Daily for Cholesterol  Current Outpatient Medications (Respiratory):    cyproheptadine (PERIACTIN) 4 MG tablet, 1/2-1 tablet at bedtime   loratadine (CLARITIN) 10 MG tablet, Take 1 tablet 1-2 times daily   Triamcinolone Acetonide (NASACORT AQ NA), Place into the nose.  Current Outpatient Medications (Analgesics):    aspirin 81 MG tablet, Take 81 mg by mouth daily.   Current Outpatient Medications (Other):    Cholecalciferol (D-3-5) 5000 UNITS capsule, Take 5,000 Units by mouth daily.   cyclobenzaprine (FLEXERIL) 10 MG tablet, Take 1 tablet (10 mg total) by mouth 2 (two)  times daily as needed for muscle spasms.   famotidine (PEPCID) 40 MG tablet, Take 1 tablet by mouth once daily in the evening   Lutein 20 MG TABS, Take by mouth.   Multiple Vitamins-Minerals (AIRBORNE) CHEW, Chew 1 tablet by mouth as needed.   Multiple Vitamins-Minerals (WOMENS MULTI VITAMIN & MINERAL PO), Take 1 tablet by mouth daily.   omeprazole (PRILOSEC) 40 MG capsule, Take 1 capsule by mouth twice daily   OVER THE COUNTER MEDICATION, Patient takes MOM as needed.   RESTASIS 0.05 % ophthalmic emulsion, instill 1 drop into both eyes twice a day   fluconazole (DIFLUCAN) 150 MG tablet, Take 1 tablet (150 mg total) by mouth daily.   hyoscyamine (LEVBID) 0.375 MG 12 hr tablet, Take by mouth.  Problem list She has GERD (gastroesophageal reflux disease); Vitamin D deficiency; Hyperlipidemia, mixed; Labile hypertension; Medication management; Abnormal glucose; Osteoporosis; Bigeminy; DOE (dyspnea on exertion); Upper airway cough syndrome; and Fear of side effects of medication on their problem list.  Review of Systems     Objective:   Physical Exam General appearance: alert, no distress, WD/WN, female HEENT: normocephalic, sclerae anicteric, TMs pearly after bilateral cerumen removal, nares patent, no discharge or erythema, pharynx normal, nontender temporal  Oral cavity: MMM, no lesions Neck: supple, no lymphadenopathy, no thyromegaly, no masses Heart: RRR, normal S1, S2, no murmurs Lungs: CTA bilaterally, no wheezes, rhonchi, or rales Abdomen: +bs, soft, + epigastric tenderness, non distended, no masses, no hepatomegaly, no splenomegaly Musculoskeletal: nontender, no swelling, no obvious deformity  Extremities: no edema, no cyanosis, no clubbing,  Pulses: 2+ symmetric, upper and lower extremities, normal cap refill Neurological: alert, oriented x 3, CN2-12 intact, strength normal upper extremities and lower extremities, sensation normal throughout, DTRs 2+ throughout, no cerebellar  signs, gait normal       Assessment & Plan:    Neck pain -     Ambulatory referral to Physical Therapy -     DG Cervical Spine Complete; Future  Thoracic back pain, unspecified back pain laterality, unspecified chronicity -     Ambulatory referral to Physical Therapy -     DG Thoracic Spine W/Swimmers; Future  B12 deficiency -     Vitamin B12  Paresthesias -     ANA -     Anti-DNA antibody, double-stranded -     Rheumatoid factor -     RNP Antibody -     Anti-Smith antibody Likely more ortho related- will get imaging and refer for PT Evaluate for lupus/autoimmune  Medication management -     CBC with Differential/Platelet -     COMPLETE METABOLIC PANEL WITH GFR  Other orders -     fluconazole (DIFLUCAN) 150 MG tablet; Take 1 tablet (150 mg total) by mouth daily.     Future Appointments  Date Time Provider [place]  02/16/2020  9:30 AM Hosie Spangle [facility]  02/16/2020 11:30 AM Trula Slade, DPM [facility]  03/20/2020  9:00 AM Unk Pinto, MD [facility] None  04/13/2020  8:00 AM Joseph Pierini, MD [name]  08/21/2020  9:00 AM Neldon Mc, Donnamarie Poag, MD [facility] None

## 2020-01-30 ENCOUNTER — Telehealth: Payer: Self-pay | Admitting: Physician Assistant

## 2020-01-30 ENCOUNTER — Ambulatory Visit (INDEPENDENT_AMBULATORY_CARE_PROVIDER_SITE_OTHER): Payer: Medicare Other | Admitting: Physician Assistant

## 2020-01-30 ENCOUNTER — Other Ambulatory Visit: Payer: Self-pay

## 2020-01-30 ENCOUNTER — Encounter: Payer: Self-pay | Admitting: Physician Assistant

## 2020-01-30 VITALS — BP 116/68 | HR 76 | Temp 97.7°F | Wt 144.0 lb

## 2020-01-30 DIAGNOSIS — M542 Cervicalgia: Secondary | ICD-10-CM | POA: Diagnosis not present

## 2020-01-30 DIAGNOSIS — R202 Paresthesia of skin: Secondary | ICD-10-CM

## 2020-01-30 DIAGNOSIS — M546 Pain in thoracic spine: Secondary | ICD-10-CM | POA: Diagnosis not present

## 2020-01-30 DIAGNOSIS — E538 Deficiency of other specified B group vitamins: Secondary | ICD-10-CM | POA: Diagnosis not present

## 2020-01-30 DIAGNOSIS — Z79899 Other long term (current) drug therapy: Secondary | ICD-10-CM

## 2020-01-30 MED ORDER — FLUCONAZOLE 150 MG PO TABS: 150.0000 mg | ORAL_TABLET | Freq: Every day | ORAL | 3 refills | Status: DC

## 2020-01-30 NOTE — Patient Instructions (Signed)
INFORMATION ABOUT YOUR XRAY [facility] Can walk into [address] building for an Insurance account manager. They will have the order and take you back. You do not any paper work, I should get the result back today or tomorrow. This order is good for a year.  Can call [phone] to schedule an appointment if you wish.   Will send to PT  [place] with black pepper extract is a great natural antiinflammatory that helps with arthritis and aches and pain. Can get from [employer] or any health food store. Need to take at least 800mg  twice a day with food TO [place].     Cervical Radiculopathy  Cervical radiculopathy happens when a nerve in the neck (a cervical nerve) is pinched or bruised. This condition can happen because of an injury to the cervical spine (vertebrae) in the neck, or as part of the normal aging process. Pressure on the cervical nerves can cause pain or numbness that travels from the neck all the way down into the arm and fingers. Usually, this condition gets better with rest. Treatment may be needed if the condition does not improve. What are the causes? This condition may be caused by:  A neck injury.  A bulging (herniated) disk.  Muscle spasms.  Muscle tightness in the neck because of overuse.  Arthritis.  Breakdown or degeneration in the bones and joints of the spine (spondylosis) due to aging.  Bone spurs that may develop near the cervical nerves. What are the signs or symptoms? Symptoms of this condition include:  Pain. The pain may travel from the neck to the arm and hand. The pain can be severe or irritating. It may be worse when you move your neck.  Numbness or tingling in your arm or hand.  Weakness in the affected arm and hand, in severe cases. How is this diagnosed? This condition may be diagnosed based on your symptoms, your medical history, and a physical exam. You may also have tests, including:  X-rays.  A CT scan.  An  MRI.  An electromyogram (EMG).  Nerve conduction tests. How is this treated? In many cases, treatment is not needed for this condition. With rest, the condition usually gets better over time. If treatment is needed, options may include:  Wearing a soft neck collar (cervical collar) for short periods of time, as told by your health care provider.  Doing physical therapy to strengthen your neck muscles.  Taking medicines, such as NSAIDs or oral corticosteroids.  Having spinal injections, in severe cases.  Having surgery. This may be needed if other treatments do not help. Different types of surgery may be done depending on the cause of this condition. Follow these instructions at home: If you have a cervical collar:  Wear it as told by your health care provider. Remove it only as told by your health care provider.  Ask your health care provider if you can remove the collar for cleaning and bathing. If you are allowed to remove the collar for cleaning or bathing: ? Follow instructions from your health care provider about how to remove the collar safely. ? Clean the collar by wiping it with mild soap and water and drying it completely. ? Take out any removable pads in the collar every 1-2 days, and wash them by hand with soap and water. Let them air-dry completely before you put them back in the collar. ? Check your skin under the collar for irritation or sores. If  you see any, tell your health care provider. Managing pain      Take over-the-counter and prescription medicines only as told by your health care provider.  If directed, put ice on the affected area. ? If you have a soft neck collar, remove it as told by your health care provider. ? Put ice in a plastic bag. ? Place a towel between your skin and the bag. ? Leave the ice on for 20 minutes, 2-3 times a day.  If applying ice does not help, you can try using heat. Use the heat source that your health care provider  recommends, such as a moist heat pack or a heating pad. ? Place a towel between your skin and the heat source. ? Leave the heat on for 20-30 minutes. ? Remove the heat if your skin turns bright red. This is especially important if you are unable to feel pain, heat, or cold. You may have a greater risk of getting burned.  Try a gentle neck and shoulder massage to help relieve symptoms. Activity  Rest as needed.  Return to your normal activities as told by your health care provider. Ask your health care provider what activities are safe for you.  Do stretching and strengthening exercises as told by your health care provider or physical therapist.  Do not lift anything that is heavier than 10 lb (4.5 kg) until your health care provider tells you that it is safe. General instructions  Use a flat pillow when you sleep.  Do not drive while wearing a cervical collar. If you do not have a cervical collar, ask your health care provider if it is safe to drive while your neck heals.  Ask your health care provider if the medicine prescribed to you requires you to avoid driving or using heavy machinery.  Do not use any products that contain nicotine or tobacco, such as cigarettes, e-cigarettes, and chewing tobacco. These can delay healing. If you need help quitting, ask your health care provider.  Keep all follow-up visits as told by your health care provider. This is important. Contact a health care provider if:  Your condition does not improve with treatment. Get help right away if:  Your pain gets much worse and cannot be controlled with medicines.  You have weakness or numbness in your hand, arm, face, or leg.  You have a high fever.  You have a stiff, rigid neck.  You lose control of your bowels or your bladder (have incontinence).  You have trouble with walking, balance, or speaking. Summary  Cervical radiculopathy happens when a nerve in the neck is pinched or bruised.  A  nerve can get pinched from a bulging disk, arthritis, muscle spasms, or an injury to the neck.  Symptoms include pain, tingling, or numbness radiating from the neck into the arm or hand. Weakness can also occur in severe cases.  Treatment may include rest, wearing a cervical collar, and physical therapy. Medicines may be prescribed to help with pain. In severe cases, injections or surgery may be needed. This information is not intended to replace advice given to you by your health care provider. Make sure you discuss any questions you have with your health care provider. Document Revised: 04/23/2018 Document Reviewed: 04/23/2018 Elsevier Patient Education  2020 [employer].

## 2020-01-30 NOTE — Telephone Encounter (Signed)
lips are peeling continually. Is this a side effect from the antiboitic- cephalexine 500mg  tid?

## 2020-01-31 ENCOUNTER — Ambulatory Visit: Payer: Medicare Other | Admitting: Podiatry

## 2020-01-31 ENCOUNTER — Encounter: Payer: Self-pay | Admitting: Podiatry

## 2020-01-31 ENCOUNTER — Encounter: Payer: Self-pay | Admitting: Allergy and Immunology

## 2020-01-31 ENCOUNTER — Ambulatory Visit: Payer: Medicare Other | Admitting: Allergy and Immunology

## 2020-01-31 VITALS — BP 108/62 | HR 62 | Temp 97.8°F | Resp 16

## 2020-01-31 DIAGNOSIS — L603 Nail dystrophy: Secondary | ICD-10-CM

## 2020-01-31 DIAGNOSIS — G4720 Circadian rhythm sleep disorder, unspecified type: Secondary | ICD-10-CM

## 2020-01-31 DIAGNOSIS — M79674 Pain in right toe(s): Secondary | ICD-10-CM | POA: Diagnosis not present

## 2020-01-31 DIAGNOSIS — L600 Ingrowing nail: Secondary | ICD-10-CM

## 2020-01-31 DIAGNOSIS — K219 Gastro-esophageal reflux disease without esophagitis: Secondary | ICD-10-CM

## 2020-01-31 DIAGNOSIS — G43909 Migraine, unspecified, not intractable, without status migrainosus: Secondary | ICD-10-CM | POA: Diagnosis not present

## 2020-01-31 DIAGNOSIS — J3089 Other allergic rhinitis: Secondary | ICD-10-CM | POA: Diagnosis not present

## 2020-01-31 NOTE — Telephone Encounter (Signed)
Left message for return call.

## 2020-01-31 NOTE — Patient Instructions (Signed)

## 2020-01-31 NOTE — Patient Instructions (Signed)
  1.  Allergen avoidance measures - dust mite, pollens, molds, cat  2.  Continue to treat and prevent inflammation:   A.  Loratadine 10 mg - 1 tablet 1-2 times per day  3.  Continue to Treat and prevent reflux:   A.  omeprazole to 40 mg twice a day   C.  famotidine 40 mg in evening  4. Can consider treating headache and sleep dysfunction:   A. Periactin 4mg  - 1/2 tablet at bedtime  5. If needed:   A. Nasal ipratropium 0.06% - 1-2 sprays each nostril every 6 hours to dry nose.  6.  Obtain fall flu vaccine (and COVID booster when available)  7. Return to clinic in 6 months or earlier if problem.

## 2020-01-31 NOTE — Telephone Encounter (Signed)
I did not see lips peeling when I saw her this last visit. Make sure to be using vaseline, can be from mask wearing sometimes. Is she still on the antibiotic? I did not see that on her medication list.   When did she start on the antibiotic?  If worse or not better make an office visit to better see it

## 2020-01-31 NOTE — Progress Notes (Signed)
[facility]   Follow-up Note  Referring Provider: Unk Pinto, MD Primary Provider: Unk Pinto, MD Date of Office Visit: 01/31/2020  Subjective:   [name] (DOB: 30-Dec-1950) is a 69 y.o. female who returns to the [facility] [place] on 01/31/2020 in re-evaluation of the following:  HPI: [name] returns to this clinic in evaluation of allergic rhinitis, LPR, migraine syndrome, and sleep dysfunction and an issue with an infected toe.  I last saw her in this clinic on 08 Nov 2019.  During her last evaluation she appeared to have a ingrown toenail with secondary infection for which we gave her a course of Augmentin and Bactroban and asked her to follow-up with [facility].  Subsequently it sounds as though she has had removal of her toenail and she is doing relatively well at this point.  Her airway has been doing well.  She had a little bit of runny nose and stuffiness the past week but otherwise has really done well with her upper airway and has not required any antibiotic or steroid for an airway problem.  Her reflux and her throat problem is under very good control at this point in time on her current therapy.  She still continues to have headaches and very significant sleep dysfunction.  She is still very hesitant about starting Periactin.  Allergies as of 01/31/2020      Reactions   Bactrim [sulfamethoxazole-trimethoprim] Other (See Comments)   Patient preference to take medication without sulfa      Medication List      aspirin 81 MG tablet Take 81 mg by mouth daily.   cyclobenzaprine 10 MG tablet Commonly known as: FLEXERIL Take 1 tablet (10 mg total) by mouth 2 (two) times daily as needed for muscle spasms.   cyproheptadine 4 MG tablet Commonly known as: PERIACTIN 1/2-1 tablet at bedtime   D-3-5 125 MCG (5000 UT) capsule Generic drug: Cholecalciferol Take 5,000 Units by mouth daily.   ezetimibe 10  MG tablet Commonly known as: ZETIA Take 1 tablet Daily for Cholesterol   famotidine 40 MG tablet Commonly known as: PEPCID Take 1 tablet by mouth once daily in the evening   hyoscyamine 0.375 MG 12 hr tablet Commonly known as: LEVBID Take by mouth.   loratadine 10 MG tablet Commonly known as: CLARITIN Take 1 tablet 1-2 times daily   Lutein 20 MG Tabs Take by mouth.   omeprazole 40 MG capsule Commonly known as: PRILOSEC Take 1 capsule by mouth twice daily   [place] Patient takes MOM as needed.   Restasis 0.05 % ophthalmic emulsion Generic drug: cycloSPORINE instill 1 drop into both eyes twice a day   WOMENS MULTI VITAMIN & MINERAL PO Take 1 tablet by mouth daily.   Airborne Google 1 tablet by mouth as needed.       Past Medical History:  Diagnosis Date  . Arthritis    cervical and lumbar  . Depression   . GERD (gastroesophageal reflux disease)   . Hyperlipidemia   . IBS (irritable bowel syndrome)   . Prediabetes   . Vitamin D deficiency     Past Surgical History:  Procedure Laterality Date  . CARDIAC CATHETERIZATION  2011   normal (Dr. Terrence Dupont)  . RADIOACTIVE SEED GUIDED EXCISIONAL BREAST BIOPSY Right 03/11/2016   Procedure: RIGHT RADIOACTIVE SEED GUIDED EXCISIONAL BREAST BIOPSY;  Surgeon: Rolm Bookbinder, MD;  Location: [facility];  Service: General;  Laterality: Right;    Review of systems negative except as noted in HPI / PMHx or noted below:  Review of Systems  Constitutional: Negative.   HENT: Negative.   Eyes: Negative.   Respiratory: Negative.   Cardiovascular: Negative.   Gastrointestinal: Negative.   Genitourinary: Negative.   Musculoskeletal: Negative.   Skin: Negative.   Neurological: Negative.   Endo/Heme/Allergies: Negative.   Psychiatric/Behavioral: Negative.      Objective:   Vitals:   01/31/20 0904  BP: 108/62  Pulse: 62  Resp: 16  Temp: 97.8 F (36.6 C)  SpO2: 97%           Physical Exam Constitutional:      Appearance: She is not diaphoretic.  HENT:     Head: Normocephalic.     Right Ear: Tympanic membrane, ear canal and external ear normal.     Left Ear: Tympanic membrane, ear canal and external ear normal.     Nose: Nose normal. No mucosal edema or rhinorrhea.     Mouth/Throat:     Pharynx: Uvula midline. No oropharyngeal exudate.  Eyes:     Conjunctiva/sclera: Conjunctivae normal.  Neck:     Thyroid: No thyromegaly.     Trachea: Trachea normal. No tracheal tenderness or tracheal deviation.  Cardiovascular:     Rate and Rhythm: Normal rate and regular rhythm.     Heart sounds: Normal heart sounds, S1 normal and S2 normal. No murmur heard.   Pulmonary:     Effort: No respiratory distress.     Breath sounds: Normal breath sounds. No stridor. No wheezing or rales.  Lymphadenopathy:     Head:     Right side of head: No tonsillar adenopathy.     Left side of head: No tonsillar adenopathy.     Cervical: No cervical adenopathy.  Skin:    Findings: No erythema or rash.     Nails: There is no clubbing.  Neurological:     Mental Status: She is alert.     Diagnostics: none  Assessment and Plan:   1. Perennial allergic rhinitis   2. LPRD (laryngopharyngeal reflux disease)   3. Migraine syndrome   4. 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  Allergen avoidance measures - dust mite, pollens, molds, cat  2.  Continue to treat and prevent inflammation:   A.  Loratadine 10 mg - 1 tablet 1-2 times per day  3.  Continue to Treat and prevent reflux:   A.  omeprazole to 40 mg twice a day   C.  famotidine 40 mg in evening  4. Can consider treating headache and sleep dysfunction:   A. Periactin 4mg  - 1/2 tablet at bedtime  5. If needed:   A. Nasal ipratropium 0.06% - 1-2 sprays each nostril every 6 hours to dry nose.  6.  Obtain fall flu vaccine (and COVID booster when available)  7. Return to clinic in 6 months or earlier  if problem.    [name] appears to be doing okay regarding her airway issue which is a combination of atopic disease and reflux induced respiratory disease on her current therapy as specified above.  She still continues to have problems with headaches and sleep dysfunction and she certainly has the option of trying Periactin to see if this helps these issues and can undergo further evaluation with her primary care doctor regarding this issue.  I will see her back in his clinic in 6 months  or earlier if there is a problem.   Allena Katz, MD [facility]

## 2020-02-01 ENCOUNTER — Encounter: Payer: Self-pay | Admitting: Allergy and Immunology

## 2020-02-01 LAB — COMPLETE METABOLIC PANEL WITH GFR
AG Ratio: 1.4 (calc) (ref 1.0–2.5)
ALT: 13 U/L (ref 6–29)
AST: 18 U/L (ref 10–35)
Albumin: 4.2 g/dL (ref 3.6–5.1)
Alkaline phosphatase (APISO): 85 U/L (ref 37–153)
BUN: 11 mg/dL (ref 7–25)
CO2: 28 mmol/L (ref 20–32)
Calcium: 9.6 mg/dL (ref 8.6–10.4)
Chloride: 103 mmol/L (ref 98–110)
Creat: 0.83 mg/dL (ref 0.50–0.99)
GFR, Est African American: 85 mL/min/{1.73_m2} (ref >=60)
GFR, Est Non African American: 73 mL/min/{1.73_m2} (ref >=60)
Globulin: 2.9 g/dL (calc) (ref 1.9–3.7)
Glucose, Bld: 86 mg/dL (ref 65–99)
Potassium: 4.4 mmol/L (ref 3.5–5.3)
Sodium: 139 mmol/L (ref 135–146)
Total Bilirubin: 0.5 mg/dL (ref 0.2–1.2)
Total Protein: 7.1 g/dL (ref 6.1–8.1)

## 2020-02-01 LAB — CBC WITH DIFFERENTIAL/PLATELET
Absolute Monocytes: 230 cells/uL (ref 200–950)
Basophils Absolute: 20 cells/uL (ref 0–200)
Basophils Relative: 0.7 %
Eosinophils Absolute: 78 cells/uL (ref 15–500)
Eosinophils Relative: 2.8 %
HCT: 38.4 % (ref 35.0–45.0)
Hemoglobin: 12.8 g/dL (ref 11.7–15.5)
Lymphs Abs: 1305 cells/uL (ref 850–3900)
MCH: 29 pg (ref 27.0–33.0)
MCHC: 33.3 g/dL (ref 32.0–36.0)
MCV: 86.9 fL (ref 80.0–100.0)
MPV: 12 fL (ref 7.5–12.5)
Monocytes Relative: 8.2 %
Neutro Abs: 1168 cells/uL — ABNORMAL LOW (ref 1500–7800)
Neutrophils Relative %: 41.7 %
Platelets: 167 10*3/uL (ref 140–400)
RBC: 4.42 10*6/uL (ref 3.80–5.10)
RDW: 12.2 % (ref 11.0–15.0)
Total Lymphocyte: 46.6 %
WBC: 2.8 10*3/uL — ABNORMAL LOW (ref 3.8–10.8)

## 2020-02-01 LAB — ANA: Anti Nuclear Antibody (ANA): NEGATIVE

## 2020-02-01 LAB — ANTI-SMITH ANTIBODY: ENA SM Ab Ser-aCnc: <1 AI

## 2020-02-01 LAB — HM MAMMOGRAPHY

## 2020-02-01 LAB — VITAMIN B12: Vitamin B-12: 613 pg/mL (ref 200–1100)

## 2020-02-01 LAB — ANTI-DNA ANTIBODY, DOUBLE-STRANDED: ds DNA Ab: <1 IU/mL

## 2020-02-01 LAB — RHEUMATOID FACTOR: Rheumatoid fact SerPl-aCnc: <14 IU/mL (ref <14)

## 2020-02-01 LAB — RNP ANTIBODY: Ribonucleic Protein(ENA) Antibody, IgG: <1 AI

## 2020-02-02 NOTE — Telephone Encounter (Signed)
Patient has followed up with her foot doctor about the ABX. That is who put her on the ABX.

## 2020-02-06 NOTE — Progress Notes (Signed)
Subjective: [name] is a 69 y.o.  female returns to office today for follow up evaluation after having right 4th nail avulsion performed.  She states that it is feeling better but still in some soreness.  She did stop the antibiotics after couple of days.  She has seen some mild clear drainage but no pus.  No increase in swelling or redness or any red streaks.  She has no new concerns today.  Denies any fevers, chills, nausea, vomiting.  No calf pain, chest pain, shortness of breath.   Objective:  General: Well developed, nourished, in no acute distress, alert and oriented x3   Dermatology: Right fourth nail bed appears to be clean, dry, with mild granular tissue.  Mild scabbing present to the area as well.  Mild swelling edema there is no erythema or warmth there is no ascending cellulitis there is no fluctuation or crepitation.  There is no malodor.  Mild tenderness palpation.  No other open lesions identified at this time.  Neurovascular status: Intact. No lower extremity swelling; No pain with calf compression bilateral.  Musculoskeletal: Decreased tenderness to palpation of the right fourth nail bed. Muscular strength within normal limits bilateral.   Assesement and Plan: S/p partial nail avulsion, improving  -Continue soaking in epsom salts twice a day followed by antibiotic ointment and a band-aid. Can leave uncovered at night. Continue this until completely healed.  She has been keeping it wrapped all the time discussed try to leave it open some more with less antibiotic ointment to allow it to heal better. -Monitor for any signs/symptoms of infection. Call the office immediately if any occur or go directly to the emergency room. Call with any questions/concerns.  Celesta Gentile, DPM

## 2020-02-10 ENCOUNTER — Ambulatory Visit
Admission: RE | Admit: 2020-02-10 | Discharge: 2020-02-10 | Disposition: A | Discharge status: Home / Self Care | Payer: Medicare Other | Source: Ambulatory Visit | Attending: Physician Assistant | Admitting: Physician Assistant

## 2020-02-10 DIAGNOSIS — M546 Pain in thoracic spine: Secondary | ICD-10-CM

## 2020-02-10 DIAGNOSIS — M542 Cervicalgia: Secondary | ICD-10-CM

## 2020-02-14 LAB — HM DEXA SCAN

## 2020-02-16 ENCOUNTER — Encounter: Payer: Self-pay | Admitting: Physical Therapy

## 2020-02-16 ENCOUNTER — Other Ambulatory Visit: Payer: Self-pay

## 2020-02-16 ENCOUNTER — Ambulatory Visit: Payer: Medicare Other | Admitting: Podiatry

## 2020-02-16 ENCOUNTER — Ambulatory Visit: Payer: Medicare Other | Attending: Physician Assistant | Admitting: Physical Therapy

## 2020-02-16 DIAGNOSIS — M546 Pain in thoracic spine: Secondary | ICD-10-CM | POA: Diagnosis present

## 2020-02-16 DIAGNOSIS — L603 Nail dystrophy: Secondary | ICD-10-CM

## 2020-02-16 DIAGNOSIS — M542 Cervicalgia: Secondary | ICD-10-CM | POA: Diagnosis present

## 2020-02-16 DIAGNOSIS — L600 Ingrowing nail: Secondary | ICD-10-CM | POA: Diagnosis not present

## 2020-02-16 NOTE — Therapy (Signed)
[place], [place], [address] Phone: [phone]   Fax:  [phone]  Physical Therapy Evaluation  Patient Details  Name: [name] MRN: [id number] Date of Birth: January 18, 1951 Referring Provider (PT): Vicie Mutters, [place]   Encounter Date: 02/16/2020   PT End of Session - 02/16/20 0934    Visit Number 1    Number of Visits 12    Date for PT Re-Evaluation 03/29/20    Authorization Type [employer] Medicare    PT Start Time 0937    PT Stop Time 1014    PT Time Calculation (min) 37 min    Activity Tolerance Patient tolerated treatment well    Behavior During Therapy [facility] for tasks assessed/performed           Past Medical History:  Diagnosis Date  . Arthritis    cervical and lumbar  . Depression   . GERD (gastroesophageal reflux disease)   . Hyperlipidemia   . IBS (irritable bowel syndrome)   . Osteoporosis   . Prediabetes   . Vitamin D deficiency     Past Surgical History:  Procedure Laterality Date  . CARDIAC CATHETERIZATION  2011   normal (Dr. Terrence Dupont)  . RADIOACTIVE SEED GUIDED EXCISIONAL BREAST BIOPSY Right 03/11/2016   Procedure: RIGHT RADIOACTIVE SEED GUIDED EXCISIONAL BREAST BIOPSY;  Surgeon: Rolm Bookbinder, MD;  Location: [facility];  Service: General;  Laterality: Right;    There were no vitals filed for this visit.    Subjective Assessment - 02/16/20 0942    Subjective Pt. is a 69 y/o female referred to PT for c/o neck/left UE and upper back pain. She reports insidious onset of symptoms last Spring about 5 months ago including pain and parasthesias in her left arm, more recently developed neck and thoracic pain and of note went to ED 01/27/20 with chest pain with work-up (-) for cardiac etiology. Current symptoms include lower cervical region pain with intermittent radiating into left arm and hand particularly into left thumb and 3-4th digit. She had also seen Dr. Jaynee Eagles for  assessment last Spring with suspected cervical radiculopathy vs. ulnar neuropathy (at the time had symptoms into 4th and 5th digit but now more in 3rd and 4th digit).    Pertinent History arthritis, osteoporosis, IBS, depression    Limitations Lifting;Sitting    Diagnostic tests X-rays    Patient Stated Goals Get rid of neck and arm pain    Currently in Pain? No/denies              [facility] PT Assessment - 02/16/20 0001      Assessment   Medical Diagnosis Neck and thoracic pain    Referring Provider (PT) Vicie Mutters, PA-C    Onset Date/Surgical Date 08/16/19   estimated   Hand Dominance Right    Prior Therapy none      Precautions   Precaution Comments osteoporosis      Restrictions   Weight Bearing Restrictions No      Balance Screen   Has the patient fallen in the past 6 months Yes    How many times? 1      [place] residence    Living Arrangements Spouse/significant other    Type of [place] to enter    Entrance Stairs-Number of Steps 4    Entrance Stairs-Rails None    Home Layout Two level    Alternate  Level Stairs-Number of Steps --   1 flight   Alternate Level Stairs-Rails Right      Prior Function   Level of Independence Independent with basic ADLs      Cognition   Overall Cognitive Status Within Functional Limits for tasks assessed      Observation/Other Assessments   Focus on Therapeutic Outcomes (FOTO)  --   incorrect FOTO set-up/wrong body part     Sensation   Light Touch Impaired Detail    Light Touch Impaired Details Impaired LUE   decreased sensation at left C7-8 dermatomes     Posture/Postural Control   Posture Comments Mild rounding of shoulders      ROM / Strength   AROM / PROM / Strength AROM;Strength      AROM   AROM Assessment Site Shoulder;Cervical    Right/Left Shoulder Right;Left    Right Shoulder Flexion 160 Degrees    Right Shoulder ABduction 170 Degrees    Right  Shoulder Internal Rotation --   reach to T6   Right Shoulder External Rotation --   reach to T2   Left Shoulder Flexion 160 Degrees    Left Shoulder ABduction 170 Degrees    Left Shoulder Internal Rotation --   reach to L1   Left Shoulder External Rotation --   reach to T1   Cervical Flexion 38    Cervical Extension 54   increased pain into bilat. shoulders left>right   Cervical - Right Side Bend 36    Cervical - Left Side Bend 30    Cervical - Right Rotation 60    Cervical - Left Rotation 70      Strength   Overall Strength Comments Right grip 57 lbs., left grip 39 lbs. with grip dynanomometer    Strength Assessment Site Shoulder;Elbow;Wrist    Right/Left Shoulder Right;Left    Right Shoulder Flexion 5/5    Right Shoulder ABduction 4+/5    Right Shoulder Internal Rotation 5/5    Right Shoulder External Rotation 5/5    Left Shoulder Flexion 5/5    Left Shoulder ABduction 4+/5    Left Shoulder Internal Rotation 5/5    Left Shoulder External Rotation 4+/5    Right/Left Elbow Right;Left    Right Elbow Flexion 5/5    Right Elbow Extension 5/5    Left Elbow Flexion 5/5    Left Elbow Extension 5/5    Right/Left Wrist Right;Left    Right Wrist Flexion 5/5    Right Wrist Extension 5/5    Left Wrist Flexion 4+/5    Left Wrist Extension 4+/5      Special Tests   Other special tests Spurling's (-), distraction not tested due to osteoporosis, ULTT for median and ulnar nerves (+) on left                      Objective measurements completed on examination: See above findings.       [facility] PT Treatment/Exercise - 02/16/20 0001      Exercises   Exercises --   HEP handout review-see chart copy                 PT Education - 02/16/20 1025    Education Details HEP, POC, spine anatomy with potential symptom etiology    Person(s) Educated Patient    Methods Explanation;Demonstration;Verbal cues;Handout    Comprehension Verbalized understanding;Returned  demonstration  PT Long Term Goals - 02/16/20 1042      PT LONG TERM GOAL #1   Title Independent with HEP    Baseline needs HEP    Time 6    Period Weeks    Status New    Target Date 03/29/20      PT LONG TERM GOAL #2   Title Increase left grip strength at least 10 lbs. and increase left shoulder abd/ER and wrist strength at least 1/1 MMT grade to improve ability for gripping and lifting activities for chores    Baseline left grip 39 lbs., shoulder 4+/5 for abd/ER and wrist flex/ext 4+/5    Time 6    Period Weeks    Status New    Target Date 03/29/20      PT LONG TERM GOAL #3   Title Increase left shoulder IR reach behind back to T10 or higher to improve ability for dressing and bathing    Baseline reach to L1    Time 6    Period Weeks    Status New    Target Date 03/29/20      PT LONG TERM GOAL #4   Title Increase right cervical rotation AROM at least 5-10 deg to improve ability to turn head while driving    Baseline 60 deg    Time 6    Period Weeks    Status New    Target Date 03/29/20      PT LONG TERM GOAL #5   Title Decrease neck/thoracic and left UE pain symptoms at least 60% or greater from baseline status to improve positional tolerance and ability for grooming/getting hair done, IADLs    Time 6    Period Weeks    Status New    Target Date 03/29/20                  Plan - 02/16/20 1036    Clinical Impression Statement Pt. presents with cervical and left UE pain and parasthesias with flexion bias for cervical ROM which could be consistent with potential cervical radiculopathy/underlying degenerative changes/stenosis. She also has some pain in her shoulder with reaching activities which could be related to radicular pain vs. local shoulder issue such as impingement. Pt. would benefit from PT to help relieve pain and address associated functional limitations.    Personal Factors and Comorbidities Comorbidity 2    Comorbidities see PMH,  osteoporosis, arthritis    Examination-Activity Limitations Hygiene/Grooming;Dressing;Lift;Reach Overhead;Carry;Sit    Examination-Participation Restrictions Cleaning    Stability/Clinical Decision Making Evolving/Moderate complexity    Clinical Decision Making Moderate    Rehab Potential Good    PT Frequency --   1-2x/week   PT Duration 6 weeks    PT Treatment/Interventions Cryotherapy;ADLs/Self Care Home Management;Electrical Stimulation;Ultrasound;Iontophoresis 4mg /ml Dexamethasone;Moist Heat;Therapeutic activities;Functional mobility training;Therapeutic exercise;Patient/family education;Manual techniques;Taping;Neuromuscular re-education;Dry needling    PT Next Visit Plan no traction due to osteoporosis, review HEP as needed and continue/progress cervical flexion bias ROM, stretches, add/progress postural strengthening with Theraband exercises, add Theraband ER, manual for STM/myofascial release and modalities prn for pain    PT Home Exercise Plan Access code: [email or web address]    Consulted and Agree with Plan of Care Patient           Patient will benefit from skilled therapeutic intervention in order to improve the following deficits and impairments:  Impaired UE functional use, Increased fascial restricitons, Decreased strength, Decreased range of motion, Impaired sensation  Visit Diagnosis: Cervicalgia  Pain in thoracic spine  Problem List Patient Active Problem List   Diagnosis Date Noted  . Fear of side effects of medication 08/28/2019  . Upper airway cough syndrome 05/04/2019  . DOE (dyspnea on exertion) 05/03/2019  . 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 Osteoporosis 08/11/2017  . Abnormal glucose 06/25/2017  . Medication management 12/12/2014  . 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 GERD (gastroesophageal reflux disease)   . Vitamin D deficiency   . Hyperlipidemia, mixed     Beaulah Dinning, PT, DPT 02/16/20 10:48 AM  [facility] [address] [place], [place], [address] Phone: [phone]   Fax:  [phone]  Name: [name] MRN: [id number] Date of Birth: Apr 20, 1951

## 2020-02-16 NOTE — Patient Instructions (Signed)

## 2020-02-17 ENCOUNTER — Other Ambulatory Visit: Payer: Self-pay | Admitting: Internal Medicine

## 2020-02-17 DIAGNOSIS — E782 Mixed hyperlipidemia: Secondary | ICD-10-CM

## 2020-02-17 MED ORDER — EZETIMIBE 10 MG PO TABS: ORAL_TABLET | ORAL | 0 refills | Status: DC

## 2020-02-17 NOTE — Progress Notes (Signed)
Subjective: [name] is a 69 y.o.  female returns to office today for follow up evaluation after having right 4th nail avulsion performed.  She states that she is doing better and her pain levels only 1/10 at times and is intermittent.  She had some peeling skin on the procedure site denies any redness or drainage.  Also she states that since the cephalexin initially she reported a rash and chest pain which resolved however today she states that since the antibiotic she has been having some GI symptoms as well as peeling lips which still remain.  She currently is not having chest pain or other symptoms.  Objective:  General: Well developed, nourished, in no acute distress, alert and oriented x3   Dermatology: Right fourth nail bed appears to be clean, dry, with slight scab still present.  There is minimal edema.  There is no erythema there is no drainage or pus or ascending cellulitis.  There is some dry skin along the periphery of the nail bed.  No open lesions otherwise.  Neurovascular status: Intact. No lower extremity swelling; No pain with calf compression bilateral.  Musculoskeletal: Currently there is no tenderness to palpation of the right fourth nail bed. Muscular strength within normal limits bilateral.   Assesement and Plan: S/p partial nail avulsion, improving  -Overall doing much better.  Procedures appears to be healing small scab remains.  Discussed with her consultation Epson salt soaks on the day or wash with soap and water.  Keep antibiotic ointment and a bandage on the area during the day. -She was to consider having the left third toenail removed in the future.  Wants to wait on that currently. -Regards to the reaction antibiotic she only took 4 days of antibiotics and has been sometime since she stopped the medication.  She still having some GI symptoms.  Recommend her to follow-up with her primary care physician to see if this is coming from the antibiotic or any other  pathology causing this.  Trula Slade DPM

## 2020-02-21 ENCOUNTER — Other Ambulatory Visit: Payer: Self-pay

## 2020-02-21 ENCOUNTER — Ambulatory Visit: Payer: Medicare Other | Admitting: Physical Therapy

## 2020-02-21 ENCOUNTER — Encounter: Payer: Self-pay | Admitting: Physical Therapy

## 2020-02-21 DIAGNOSIS — M546 Pain in thoracic spine: Secondary | ICD-10-CM

## 2020-02-21 DIAGNOSIS — M542 Cervicalgia: Secondary | ICD-10-CM | POA: Diagnosis not present

## 2020-02-21 NOTE — Therapy (Signed)
[place], [place], [address] Phone: [phone]   Fax:  [phone]  Physical Therapy Treatment  Patient Details  Name: [name] MRN: [id number] Date of Birth: [birth year] Referring Provider (PT): Vicie Mutters, [place]   Encounter Date: 02/21/2020   PT End of Session - 02/21/20 1448    Visit Number 2    Number of Visits 12    Date for PT Re-Evaluation 03/29/20    Authorization Type [employer] Medicare    PT Start Time 2482   pt. arrived late   PT Stop Time 1458    PT Time Calculation (min) 26 min    Activity Tolerance Patient tolerated treatment well   excepting pain with attempted nerve glide   Behavior During Therapy [facility] for tasks assessed/performed           Past Medical History:  Diagnosis Date  . Arthritis    cervical and lumbar  . Depression   . GERD (gastroesophageal reflux disease)   . Hyperlipidemia   . IBS (irritable bowel syndrome)   . Osteoporosis   . Prediabetes   . Vitamin D deficiency     Past Surgical History:  Procedure Laterality Date  . CARDIAC CATHETERIZATION  2011   normal (Dr. Terrence Dupont)  . RADIOACTIVE SEED GUIDED EXCISIONAL BREAST BIOPSY Right 03/11/2016   Procedure: RIGHT RADIOACTIVE SEED GUIDED EXCISIONAL BREAST BIOPSY;  Surgeon: Rolm Bookbinder, MD;  Location: [place];  Service: General;  Laterality: Right;    There were no vitals filed for this visit.   Subjective Assessment - 02/21/20 1433    Subjective Pt. noting some mild improvement from baseline but still having some left UE radiating symptoms. Pain 1-2/10 this PM.                             [facility] Adult PT Treatment/Exercise - 02/21/20 0001      Exercises   Exercises Neck      Neck Exercises: Theraband   Shoulder Extension 20 reps;Red    Rows 20 reps;Green    Shoulder External Rotation 20 reps;Red    Shoulder External Rotation Limitations supine    Horizontal  ABduction 20 reps;Red    Horizontal ABduction Limitations supine      Neck Exercises: Seated   Other Seated Exercise seated left median nerve glide x 10 reps      Neck Exercises: Supine   Neck Retraction 20 reps    Neck Retraction Limitations supine with gentle therapist assistance    Capital Flexion Limitations supine retraction to assisted flexion x 15 reps      Manual Therapy   Manual Therapy Neural Stretch    Neural Stretch attempted left median nerve glide but held due to shoulder pain with positioning-as alternative performed seated self median nerve glide x 10 reps      Neck Exercises: Stretches   Upper Trapezius Stretch Left;3 reps;20 seconds    Upper Trapezius Stretch Limitations gentle supine manual stretch    Levator Stretch Left;3 reps;20 seconds    Levator Stretch Limitations gentle supine manual stretch                  PT Education - 02/21/20 1448    Education Details POC    Person(s) Educated Patient    Methods Explanation    Comprehension Verbalized understanding  PT Long Term Goals - 02/16/20 1042      PT LONG TERM GOAL #1   Title Independent with HEP    Baseline needs HEP    Time 6    Period Weeks    Status New    Target Date 03/29/20      PT LONG TERM GOAL #2   Title Increase left grip strength at least 10 lbs. and increase left shoulder abd/ER and wrist strength at least 1/1 MMT grade to improve ability for gripping and lifting activities for chores    Baseline left grip 39 lbs., shoulder 4+/5 for abd/ER and wrist flex/ext 4+/5    Time 6    Period Weeks    Status New    Target Date 03/29/20      PT LONG TERM GOAL #3   Title Increase left shoulder IR reach behind back to T10 or higher to improve ability for dressing and bathing    Baseline reach to L1    Time 6    Period Weeks    Status New    Target Date 03/29/20      PT LONG TERM GOAL #4   Title Increase right cervical rotation AROM at least 5-10 deg to improve  ability to turn head while driving    Baseline 60 deg    Time 6    Period Weeks    Status New    Target Date 03/29/20      PT LONG TERM GOAL #5   Title Decrease neck/thoracic and left UE pain symptoms at least 60% or greater from baseline status to improve positional tolerance and ability for grooming/getting hair done, IADLs    Time 6    Period Weeks    Status New    Target Date 03/29/20                 Plan - 02/21/20 1457    Clinical Impression Statement Tx. today for flexion bias cervical ROM and upper back/shoulder/periscapular strengthening-good tolerance excepting had some shoulder pain with attempted supine nerve glides-unclear if this was associated with radicular symptoms vs. local shoulder issues with shoulder positioned in abduction so switched to seated self nerve glide with improved tolerance/updated HEP reflective of this. Mild improvement from baseline status wth subjective symptoms but otherwise goals ongoing.    Personal Factors and Comorbidities Comorbidity 2    Comorbidities see PMH, osteoporosis, arthritis    Examination-Activity Limitations Hygiene/Grooming;Dressing;Lift;Reach Overhead;Carry;Sit    Examination-Participation Restrictions Cleaning    Stability/Clinical Decision Making Evolving/Moderate complexity    Clinical Decision Making Moderate    Rehab Potential Good    PT Frequency --   1-2x/week   PT Duration 6 weeks    PT Treatment/Interventions Cryotherapy;ADLs/Self Care Home Management;Electrical Stimulation;Ultrasound;Iontophoresis 4mg /ml Dexamethasone;Moist Heat;Therapeutic activities;Functional mobility training;Therapeutic exercise;Patient/family education;Manual techniques;Taping;Neuromuscular re-education;Dry needling    PT Next Visit Plan no traction due to osteoporosis, continue/progress cervical flexion bias ROM, stretches, postural strengthening, manual for STM/myofascial release and modalities prn for pain    PT Home Exercise Plan Access  code: [email or web address]    Consulted and Agree with Plan of Care Patient           Patient will benefit from skilled therapeutic intervention in order to improve the following deficits and impairments:  Impaired UE functional use, Increased fascial restricitons, Decreased strength, Decreased range of motion, Impaired sensation  Visit Diagnosis: Cervicalgia  Pain in thoracic spine     Problem List Patient Active Problem List   Diagnosis  Date Noted  . Fear of side effects of medication 08/28/2019  . Upper airway cough syndrome 05/04/2019  . DOE (dyspnea on exertion) 05/03/2019  . 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 Osteoporosis 08/11/2017  . Abnormal glucose 06/25/2017  . Medication management 12/12/2014  . 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 GERD (gastroesophageal reflux disease)   . Vitamin D deficiency   . Hyperlipidemia, mixed    Beaulah Dinning, PT, DPT 02/21/20 3:06 PM  [place] [facility] [address] [place], [place], [address] Phone: [phone]   Fax:  [phone]  Name: Rylen Hou [place] MRN: [id number] Date of Birth: 11-02-1950

## 2020-02-24 ENCOUNTER — Ambulatory Visit: Payer: Medicare Other | Admitting: Physical Therapy

## 2020-02-24 ENCOUNTER — Other Ambulatory Visit: Payer: Self-pay

## 2020-02-28 ENCOUNTER — Encounter: Payer: Self-pay | Admitting: Physical Therapy

## 2020-02-28 ENCOUNTER — Ambulatory Visit: Payer: Medicare Other | Admitting: Physical Therapy

## 2020-02-28 ENCOUNTER — Other Ambulatory Visit: Payer: Self-pay

## 2020-02-28 DIAGNOSIS — M546 Pain in thoracic spine: Secondary | ICD-10-CM

## 2020-02-28 DIAGNOSIS — M542 Cervicalgia: Secondary | ICD-10-CM | POA: Diagnosis not present

## 2020-02-28 NOTE — Therapy (Signed)
[place], [place], [address] Phone: [phone]   Fax:  [phone]  Physical Therapy Treatment  Patient Details  Name: [name] MRN: [id number] Date of Birth: Oct 16, 1950 Referring Provider (PT): Vicie Mutters, [place]   Encounter Date: 02/28/2020   PT End of Session - 02/28/20 1238    Visit Number 3    Number of Visits 12    Date for PT Re-Evaluation 03/29/20    Authorization Type UHC Medicare    PT Start Time 1145    PT Stop Time 1224    PT Time Calculation (min) 39 min    Activity Tolerance Patient tolerated treatment well    Behavior During Therapy [facility] for tasks assessed/performed           Past Medical History:  Diagnosis Date  . Arthritis    cervical and lumbar  . Depression   . GERD (gastroesophageal reflux disease)   . Hyperlipidemia   . IBS (irritable bowel syndrome)   . Osteoporosis   . Prediabetes   . Vitamin D deficiency     Past Surgical History:  Procedure Laterality Date  . CARDIAC CATHETERIZATION  2011   normal (Dr. Terrence Dupont)  . RADIOACTIVE SEED GUIDED EXCISIONAL BREAST BIOPSY Right 03/11/2016   Procedure: RIGHT RADIOACTIVE SEED GUIDED EXCISIONAL BREAST BIOPSY;  Surgeon: Rolm Bookbinder, MD;  Location: [place];  Service: General;  Laterality: Right;    There were no vitals filed for this visit.   Subjective Assessment - 02/28/20 1147    Subjective Still having some intermittent left UE symptoms pain>parasthesias. Pt. rates improvement from baseline status at around 40%.    Pertinent History arthritis, osteoporosis, IBS, depression    Diagnostic tests X-rays    Patient Stated Goals Get rid of neck and arm pain    Currently in Pain? No/denies              [facility] PT Assessment - 02/28/20 0001      AROM   Cervical - Right Rotation 68    Cervical - Left Rotation 73                         [facility] Adult PT Treatment/Exercise -  02/28/20 0001      Neck Exercises: Theraband   Shoulder Extension 20 reps;Red    Rows 20 reps;Green    Shoulder External Rotation 20 reps;Red    Shoulder External Rotation Limitations supine    Horizontal ABduction 20 reps;Red    Horizontal ABduction Limitations supine      Neck Exercises: Seated   Cervical Isometrics Flexion    Cervical Isometrics Limitations 5 sec holds x 15 reps with fist under chin      Neck Exercises: Supine   Neck Retraction 20 reps    Neck Retraction Limitations supine with gentle therapist assistance    Capital Flexion Limitations supine retraction to assisted flexion x 15 reps      Manual Therapy   Manual Therapy Soft tissue mobilization;Myofascial release    Soft tissue mobilization brief STM left infraspinatus region    Myofascial Release subocciptal release    Neural Stretch left ulnar and median nerve glides in sitting-modified for limited shoulder abduction due to pain      Neck Exercises: Stretches   Upper Trapezius Stretch Left;3 reps;20 seconds    Upper Trapezius Stretch Limitations gentle supine manual stretch    Levator Stretch Left;3 reps;20 seconds  Levator Stretch Limitations gentle supine manual stretch    Chest Stretch --   supine manual pec minor stretch 3x20 sec                 PT Education - 02/28/20 1242    Education Details exercises, spine anatomy with potential symptom etiology and mechanical effect for flexion bias cervical ROM    Person(s) Educated Patient    Methods Explanation;Demonstration;Verbal cues    Comprehension Verbalized understanding               PT Long Term Goals - 02/28/20 1211      PT LONG TERM GOAL #1   Title Independent with HEP    Baseline met for initial HEP, will continue to update prn    Time 6    Period Weeks    Status Achieved      PT LONG TERM GOAL #2   Title Increase left grip strength at least 10 lbs. and increase left shoulder abd/ER and wrist strength at least 1/1 MMT  grade to improve ability for gripping and lifting activities for chores    Baseline left grip 39 lbs., shoulder 4+/5 for abd/ER and wrist flex/ext 4+/5    Time 6    Period Weeks    Status On-going      PT LONG TERM GOAL #3   Title Increase left shoulder IR reach behind back to T10 or higher to improve ability for dressing and bathing    Baseline reach to L1    Time 6    Period Weeks    Status On-going      PT LONG TERM GOAL #4   Title Increase right cervical rotation AROM at least 5-10 deg to improve ability to turn head while driving    Baseline 68 deg 02/28/20    Time 6    Period Weeks    Status Achieved      PT LONG TERM GOAL #5   Title Decrease neck/thoracic and left UE pain symptoms at least 60% or greater from baseline status to improve positional tolerance and ability for grooming/getting hair done, IADLs    Time 6    Period Weeks    Status On-going                 Plan - 02/28/20 1209    Clinical Impression Statement Moderate improvement from baseline status with decreased cervical/left UE radiating symptoms though continues with intermittent symptoms. Also improving from baseline status with cervical ROM for rotation. Still limited in tolerance for positions involving shoulder abduction which suspect could be associated with neural tension vs. local shoulder issues such as impingement so continued modified nerve glides along with shoulder strengthening to help address. LTGs other than for HEP and cervical rotation AROM still ongoing.    Personal Factors and Comorbidities Comorbidity 2    Comorbidities see PMH, osteoporosis, arthritis    Examination-Activity Limitations Hygiene/Grooming;Dressing;Lift;Reach Overhead;Carry;Sit    Stability/Clinical Decision Making Evolving/Moderate complexity    Clinical Decision Making Moderate    Rehab Potential Good    PT Frequency --   1-2x/week   PT Duration 6 weeks    PT Treatment/Interventions Cryotherapy;ADLs/Self Care Home  Management;Electrical Stimulation;Ultrasound;Iontophoresis 57m/ml Dexamethasone;Moist Heat;Therapeutic activities;Functional mobility training;Therapeutic exercise;Patient/family education;Manual techniques;Taping;Neuromuscular re-education;Dry needling    PT Next Visit Plan no traction or spinal mobs due to osteoporosis, continue/progress cervical flexion bias ROM, stretches, postural strengthening, manual for STM/myofascial release and modalities prn for pain, modify positioning as needed for nerve  glides and shoulder strengthening due to pain with shoulder in abducted position    PT Home Exercise Plan Access code: [email or web address]    Consulted and Agree with Plan of Care Patient           Patient will benefit from skilled therapeutic intervention in order to improve the following deficits and impairments:  Impaired UE functional use, Increased fascial restricitons, Decreased strength, Decreased range of motion, Impaired sensation  Visit Diagnosis: Cervicalgia  Pain in thoracic spine     Problem List Patient Active Problem List   Diagnosis Date Noted  . Fear of side effects of medication 08/28/2019  . Upper airway cough syndrome 05/04/2019  . DOE (dyspnea on exertion) 05/03/2019  . 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 Osteoporosis 08/11/2017  . Abnormal glucose 06/25/2017  . Medication management 12/12/2014  . 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 GERD (gastroesophageal reflux disease)   . Vitamin D deficiency   . Hyperlipidemia, mixed     [name], PT, DPT 02/28/20 12:44 PM  [place] [facility] [address] [place], [place], [address] Phone: [phone]   Fax:  [phone]  Name: [name] MRN: [id number] Date of Birth: 25-Oct-1950

## 2020-03-01 ENCOUNTER — Encounter: Payer: Self-pay | Admitting: Physical Therapy

## 2020-03-01 ENCOUNTER — Other Ambulatory Visit: Payer: Self-pay

## 2020-03-01 ENCOUNTER — Ambulatory Visit: Payer: Medicare Other | Admitting: Physical Therapy

## 2020-03-01 DIAGNOSIS — M542 Cervicalgia: Secondary | ICD-10-CM | POA: Diagnosis not present

## 2020-03-01 DIAGNOSIS — M546 Pain in thoracic spine: Secondary | ICD-10-CM

## 2020-03-01 NOTE — Therapy (Signed)
[place], [place], [address] Phone: [phone]   Fax:  [phone]  Physical Therapy Treatment  Patient Details  Name: [name] MRN: [id number] Date of Birth: 21-May-1951 Referring Provider (PT): Vicie Mutters, [place]   Encounter Date: 03/01/2020   PT End of Session - 03/01/20 0940    Visit Number 4    Number of Visits 12    Date for PT Re-Evaluation 03/29/20    Authorization Type UHC Medicare    PT Start Time 0935    PT Stop Time 1010    PT Time Calculation (min) 35 min    Activity Tolerance Patient tolerated treatment well    Behavior During Therapy [facility] for tasks assessed/performed           Past Medical History:  Diagnosis Date  . Arthritis    cervical and lumbar  . Depression   . GERD (gastroesophageal reflux disease)   . Hyperlipidemia   . IBS (irritable bowel syndrome)   . Osteoporosis   . Prediabetes   . Vitamin D deficiency     Past Surgical History:  Procedure Laterality Date  . CARDIAC CATHETERIZATION  2011   normal (Dr. Terrence Dupont)  . RADIOACTIVE SEED GUIDED EXCISIONAL BREAST BIOPSY Right 03/11/2016   Procedure: RIGHT RADIOACTIVE SEED GUIDED EXCISIONAL BREAST BIOPSY;  Surgeon: Rolm Bookbinder, MD;  Location: [place];  Service: General;  Laterality: Right;    There were no vitals filed for this visit.   Subjective Assessment - 03/01/20 0957    Subjective Pt arrived late.  Pain between 0-1 /10.  Mostly occurs at night.    Currently in Pain? Yes    Pain Score 1     Pain Location Arm    Pain Orientation Left;Lateral    Pain Descriptors / Indicators Aching    Pain Type Chronic pain    Pain Frequency Intermittent    Aggravating Factors  leaning back (cervical extension)    Pain Relieving Factors rest    Multiple Pain Sites No             [facility] Adult PT Treatment/Exercise - 03/01/20 0001      Neck Exercises: Standing   Wall Push Ups 10 reps     Wall Wash wall: goal post and wall angels     Other Standing Exercises horiz abd , ER/IR and diagonal  pull with red band x 10 each UE on wall for posture     Other Standing Exercises wall slides wide Y for lower trap setting x 10 , tactile cues       Neck Exercises: Seated   Neck Retraction 10 reps;5 secs      Moist Heat Therapy   Number Minutes Moist Heat 10 Minutes    Moist Heat Location Cervical      Manual Therapy   Manual therapy comments PROM in rotation and sidebending     Myofascial Release subocciptal release      Neck Exercises: Stretches   Upper Trapezius Stretch Right;Left;2 reps;30 seconds    Levator Stretch Right;Left;2 reps;30 seconds    Lower Cervical/Upper Thoracic Stretch --   wall for upper back stretch x 5 with cervical flexion                       PT Long Term Goals - 02/28/20 1211      PT LONG TERM GOAL #1   Title Independent with HEP  Baseline met for initial HEP, will continue to update prn    Time 6    Period Weeks    Status Achieved      PT LONG TERM GOAL #2   Title Increase left grip strength at least 10 lbs. and increase left shoulder abd/ER and wrist strength at least 1/1 MMT grade to improve ability for gripping and lifting activities for chores    Baseline left grip 39 lbs., shoulder 4+/5 for abd/ER and wrist flex/ext 4+/5    Time 6    Period Weeks    Status On-going      PT LONG TERM GOAL #3   Title Increase left shoulder IR reach behind back to T10 or higher to improve ability for dressing and bathing    Baseline reach to L1    Time 6    Period Weeks    Status On-going      PT LONG TERM GOAL #4   Title Increase right cervical rotation AROM at least 5-10 deg to improve ability to turn head while driving    Baseline 68 deg 02/28/20    Time 6    Period Weeks    Status Achieved      PT LONG TERM GOAL #5   Title Decrease neck/thoracic and left UE pain symptoms at least 60% or greater from baseline status to improve  positional tolerance and ability for grooming/getting hair done, IADLs    Time 6    Period Weeks    Status On-going                 Plan - 03/01/20 1432    Clinical Impression Statement Kelsie arrived late, shortened session and she just got her appt time wrong.  We worked on standing posture and alignment using wall for cues.  Limited in L shoulder ROM and strength end range. Used MHP to relieve tension in L upper trap.    PT Treatment/Interventions Cryotherapy;ADLs/Self Care Home Management;Electrical Stimulation;Ultrasound;Iontophoresis 4mg /ml Dexamethasone;Moist Heat;Therapeutic activities;Functional mobility training;Therapeutic exercise;Patient/family education;Manual techniques;Taping;Neuromuscular re-education;Dry needling    PT Next Visit Plan no traction or spinal mobs due to osteoporosis, continue/progress cervical flexion bias ROM, stretches, postural strengthening, manual for STM/myofascial release and modalities prn for pain, modify positioning as needed for nerve glides and shoulder strengthening due to pain with shoulder in abducted position    PT Home Exercise Plan Access code: [email or web address]    Consulted and Agree with Plan of Care Patient           Patient will benefit from skilled therapeutic intervention in order to improve the following deficits and impairments:  Impaired UE functional use, Increased fascial restricitons, Decreased strength, Decreased range of motion, Impaired sensation  Visit Diagnosis: Cervicalgia  Pain in thoracic spine     Problem List Patient Active Problem List   Diagnosis Date Noted  . Fear of side effects of medication 08/28/2019  . Upper airway cough syndrome 05/04/2019  . DOE (dyspnea on exertion) 05/03/2019  . 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 Osteoporosis 08/11/2017  . Abnormal glucose 06/25/2017  . Medication management 12/12/2014  . 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 GERD (gastroesophageal reflux disease)   . Vitamin D deficiency   .  Hyperlipidemia, mixed     Stavros Cail 03/01/2020, 2:38 PM  [place], [place], [address] Phone: [phone]   Fax:  [phone]  Name: Jadene Stemmer Wehrenberg MRN: [id number] Date of Birth: 05-10-1951  Raeford Razor, PT 03/01/20 2:38 PM Phone:  [phone] Fax: [phone]  

## 2020-03-06 ENCOUNTER — Ambulatory Visit: Payer: Medicare Other | Admitting: Physical Therapy

## 2020-03-06 ENCOUNTER — Other Ambulatory Visit: Payer: Self-pay

## 2020-03-06 ENCOUNTER — Encounter: Payer: Self-pay | Admitting: Physical Therapy

## 2020-03-06 DIAGNOSIS — M546 Pain in thoracic spine: Secondary | ICD-10-CM

## 2020-03-06 DIAGNOSIS — M542 Cervicalgia: Secondary | ICD-10-CM

## 2020-03-06 NOTE — Patient Instructions (Signed)
Over Head Pull: Narrow Grip       On back, knees bent, feet flat, band across thighs, elbows straight but relaxed. Pull hands apart (start). Keeping elbows straight, bring arms up and over head, hands toward floor. Keep pull steady on band. Hold momentarily. Return slowly, keeping pull steady, back to start. Repeat __15_ times. Band color __red/green ____   Side Pull: Double Arm   On back, knees bent, feet flat. Arms perpendicular to body, shoulder level, elbows straight but relaxed. Pull arms out to sides, elbows straight. Resistance band comes across collarbones, hands toward floor. Hold momentarily. Slowly return to starting position. Repeat __15_ times. Band color _Red/green____   Sash   On back, knees bent, feet flat, left hand on left hip, right hand above left. Pull right arm DIAGONALLY (hip to shoulder) across chest. Bring right arm along head toward floor. Hold momentarily. Slowly return to starting position. Repeat __15_ times. Do with left arm. Band color ___red___   Shoulder Rotation: Double Arm   On back, knees bent, feet flat, elbows tucked at sides, bent 90, hands palms up. Pull hands apart and down toward floor, keeping elbows near sides. Hold momentarily. Slowly return to starting position. Repeat __15_ times. Band color _red/green _____

## 2020-03-06 NOTE — Therapy (Signed)
[place], [place], [address] Phone: [phone]   Fax:  [phone]  Physical Therapy Treatment  Patient Details  Name: Dorlis Judice Herbig MRN: [id number] Date of Birth: [birth year] Referring Provider (PT): Vicie Mutters, [place]   Encounter Date: 03/06/2020   PT End of Session - 03/06/20 0942    Visit Number 5    Number of Visits 12    Date for PT Re-Evaluation 03/29/20    Authorization Type UHC Medicare    PT Start Time 0918    PT Stop Time 1000    PT Time Calculation (min) 42 min    Activity Tolerance Patient tolerated treatment well    Behavior During Therapy [facility] for tasks assessed/performed           Past Medical History:  Diagnosis Date  . Arthritis    cervical and lumbar  . Depression   . GERD (gastroesophageal reflux disease)   . Hyperlipidemia   . IBS (irritable bowel syndrome)   . Osteoporosis   . Prediabetes   . Vitamin D deficiency     Past Surgical History:  Procedure Laterality Date  . CARDIAC CATHETERIZATION  2011   normal (Dr. Terrence Dupont)  . RADIOACTIVE SEED GUIDED EXCISIONAL BREAST BIOPSY Right 03/11/2016   Procedure: RIGHT RADIOACTIVE SEED GUIDED EXCISIONAL BREAST BIOPSY;  Surgeon: Rolm Bookbinder, MD;  Location: [place];  Service: General;  Laterality: Right;    There were no vitals filed for this visit.   Subjective Assessment - 03/06/20 0931    Subjective Pt has L UE pain today , reporting with reaching back and also across.  Neck pain is min to none today. Mentioned rotator cuff but no diagnosis.    Currently in Pain? Yes    Pain Score 2    with motion   Pain Location Shoulder    Pain Orientation Left    Pain Descriptors / Indicators Aching    Pain Type Chronic pain    Pain Radiating Towards can go into lower arm    Pain Onset More than a month ago    Pain Frequency Intermittent    Aggravating Factors  reaching across, back    Pain Relieving Factors  rest              [facility] PT Assessment - 03/06/20 0001      AROM   Left Shoulder ABduction --   painful arc   Left Shoulder Internal Rotation --   pain    Left Shoulder External Rotation --   no pain      Strength   Left Shoulder ABduction 4+/5      Special Tests    Special Tests Rotator Cuff Impingement    Rotator Cuff Impingment tests Empty Can test      Empty Can test   Findings Negative    Side Left    Comment no pain                 [facility] Adult PT Treatment/Exercise - 03/06/20 0001      Neck Exercises: Machines for Strengthening   UBE (Upper Arm Bike) 5 L1, switch directions      Neck Exercises: Supine   Other Supine Exercise supine scapular retraction : overhead lift x 10, horizontal abd, ER/IR  x 10-15 green band       Manual Therapy   Manual therapy comments PROM in rotation and sidebending     Myofascial Release subocciptal release  Neural Stretch Lt UE , median n and ulnar n       Neck Exercises: Stretches   Upper Trapezius Stretch Right;Left;2 reps;30 seconds    Levator Stretch Right;Left;2 reps;30 seconds    Other Neck Stretches cervical ext. hands behind head x 3                        PT Long Term Goals - 02/28/20 1211      PT LONG TERM GOAL #1   Title Independent with HEP    Baseline met for initial HEP, will continue to update prn    Time 6    Period Weeks    Status Achieved      PT LONG TERM GOAL #2   Title Increase left grip strength at least 10 lbs. and increase left shoulder abd/ER and wrist strength at least 1/1 MMT grade to improve ability for gripping and lifting activities for chores    Baseline left grip 39 lbs., shoulder 4+/5 for abd/ER and wrist flex/ext 4+/5    Time 6    Period Weeks    Status On-going      PT LONG TERM GOAL #3   Title Increase left shoulder IR reach behind back to T10 or higher to improve ability for dressing and bathing    Baseline reach to L1    Time 6    Period Weeks    Status  On-going      PT LONG TERM GOAL #4   Title Increase right cervical rotation AROM at least 5-10 deg to improve ability to turn head while driving    Baseline 68 deg 02/28/20    Time 6    Period Weeks    Status Achieved      PT LONG TERM GOAL #5   Title Decrease neck/thoracic and left UE pain symptoms at least 60% or greater from baseline status to improve positional tolerance and ability for grooming/getting hair done, IADLs    Time 6    Period Weeks    Status On-going                 Plan - 03/06/20 0957    Clinical Impression Statement Pt mostly with L UE symptoms today, seemingly from nerve tension along ulnar and median nerve. Worked on supine stabilization of cervical spine, scapula.  Began discussing weightbearing exercise for increasing bone density.    PT Treatment/Interventions Cryotherapy;ADLs/Self Care Home Management;Electrical Stimulation;Ultrasound;Iontophoresis 66m/ml Dexamethasone;Moist Heat;Therapeutic activities;Functional mobility training;Therapeutic exercise;Patient/family education;Manual techniques;Taping;Neuromuscular re-education;Dry needling    PT Next Visit Plan no traction or spinal mobs due to osteoporosis, continue/progress cervical flexion bias ROM, stretches, postural strengthening, manual for STM/myofascial release and modalities prn for pain, modify positioning as needed for nerve glides and shoulder strengthening due to pain with shoulder in abducted position    PT Home Exercise Plan Access code: [email or web address] Red/green supine scap stab,    Consulted and Agree with Plan of Care Patient           Patient will benefit from skilled therapeutic intervention in order to improve the following deficits and impairments:  Impaired UE functional use, Increased fascial restricitons, Decreased strength, Decreased range of motion, Impaired sensation  Visit Diagnosis: Cervicalgia  Pain in thoracic spine     Problem List Patient Active Problem List    Diagnosis Date Noted  . Fear of side effects of medication 08/28/2019  . Upper airway cough syndrome 05/04/2019  . DOE (dyspnea on  exertion) 05/03/2019  . 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 Osteoporosis 08/11/2017  . Abnormal glucose 06/25/2017  . Medication management 12/12/2014  . 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 GERD (gastroesophageal reflux disease)   . Vitamin D deficiency   . Hyperlipidemia, mixed     Leam Madero 03/06/2020, 2:01 PM  [place], [place], [address] Phone: [phone]   Fax:  [phone]  Name: Kendalyn Cranfield Swaggerty MRN: [id number] Date of Birth: [birth year]  Raeford Razor, PT 03/06/20 2:01 PM Phone: [phone] Fax: [phone]

## 2020-03-08 ENCOUNTER — Encounter: Payer: Self-pay | Admitting: Physical Therapy

## 2020-03-08 ENCOUNTER — Ambulatory Visit: Payer: Medicare Other | Admitting: Physical Therapy

## 2020-03-08 ENCOUNTER — Other Ambulatory Visit: Payer: Self-pay

## 2020-03-08 DIAGNOSIS — M546 Pain in thoracic spine: Secondary | ICD-10-CM

## 2020-03-08 DIAGNOSIS — M542 Cervicalgia: Secondary | ICD-10-CM | POA: Diagnosis not present

## 2020-03-08 NOTE — Therapy (Signed)
[place], [place], [address] Phone: [phone]   Fax:  [phone]  Physical Therapy Treatment  Patient Details  Name: [name] MRN: [id number] Date of Birth: 1951-03-05 Referring Provider (PT): [name], [place]   Encounter Date: 03/08/2020   PT End of Session - 03/08/20 0924    Visit Number 6    Number of Visits 12    Date for PT Re-Evaluation 03/29/20    Authorization Type UHC Medicare    PT Start Time 0917    PT Stop Time 1000    PT Time Calculation (min) 43 min    Activity Tolerance Patient tolerated treatment well    Behavior During Therapy [facility] for tasks assessed/performed           Past Medical History:  Diagnosis Date  . Arthritis    cervical and lumbar  . Depression   . GERD (gastroesophageal reflux disease)   . Hyperlipidemia   . IBS (irritable bowel syndrome)   . Osteoporosis   . Prediabetes   . Vitamin D deficiency     Past Surgical History:  Procedure Laterality Date  . CARDIAC CATHETERIZATION  2011   normal (Dr. Terrence Garrett)  . RADIOACTIVE SEED GUIDED EXCISIONAL BREAST BIOPSY Right 03/11/2016   Procedure: RIGHT RADIOACTIVE SEED GUIDED EXCISIONAL BREAST BIOPSY;  Surgeon: Jodi Bookbinder, MD;  Location: [place];  Service: General;  Laterality: Right;    There were no vitals filed for this visit.   Subjective Assessment - 03/08/20 0923    Subjective Had a flu shot yesterday arm is sore.  No neck pain , just some tightness in L arm    Currently in Pain? No/denies               [facility] Adult PT Treatment/Exercise - 03/08/20 0001      Self-Care   Self-Care Other Self-Care Comments    Other Self-Care Comments  nerve flossing, stretching, posture       Neck Exercises: Machines for Strengthening   UBE (Upper Arm Bike) 5 L1, switch directions      Shoulder Exercises: Standing   Horizontal ABduction Strengthening;Both;10 reps    Theraband Level  (Shoulder Horizontal ABduction) Level 1 (Yellow)    External Rotation Both;20 reps;Theraband    Theraband Level (Shoulder External Rotation) Level 2 (Red)    Theraband Level (Shoulder Flexion) Level 1 (Yellow)    Shoulder Flexion Weight (lbs) x 10 unilateral with scapular stability     Extension Strengthening;Both;15 reps    Theraband Level (Shoulder Extension) Level 2 (Red)    Row Strengthening;Both;15 reps;Theraband    Theraband Level (Shoulder Row) Level 3 (Green)    Diagonals Strengthening;Both;10 reps    Theraband Level (Shoulder Diagonals) Level 1 (Yellow)      Hand Exercises for Cervical Radiculopathy   Other Hand Exercise for Cervical Radiculopathy Nerve glides in standing median N and ulnar N , flossing                        PT Long Term Goals - 02/28/20 1211      PT LONG TERM GOAL #1   Title Independent with HEP    Baseline met for initial HEP, will continue to update prn    Time 6    Period Weeks    Status Achieved      PT LONG TERM GOAL #2   Title Increase left grip strength at least 10 lbs.  and increase left shoulder abd/ER and wrist strength at least 1/1 MMT grade to improve ability for gripping and lifting activities for chores    Baseline left grip 39 lbs., shoulder 4+/5 for abd/ER and wrist flex/ext 4+/5    Time 6    Period Weeks    Status On-going      PT LONG TERM GOAL #3   Title Increase left shoulder IR reach behind back to T10 or higher to improve ability for dressing and bathing    Baseline reach to L1    Time 6    Period Weeks    Status On-going      PT LONG TERM GOAL #4   Title Increase right cervical rotation AROM at least 5-10 deg to improve ability to turn head while driving    Baseline 68 deg 02/28/20    Time 6    Period Weeks    Status Achieved      PT LONG TERM GOAL #5   Title Decrease neck/thoracic and left UE pain symptoms at least 60% or greater from baseline status to improve positional tolerance and ability for  grooming/getting hair done, IADLs    Time 6    Period Weeks    Status On-going                 Plan - 03/08/20 1141    Clinical Impression Statement Pt with improved ability to reach back and behind her with both median nerve and ulnat nerve glides.  Tolerated strengtening well without increasing neck pain .    PT Treatment/Interventions Cryotherapy;ADLs/Self Care Home Management;Electrical Stimulation;Ultrasound;Iontophoresis 56m/ml Dexamethasone;Moist Heat;Therapeutic activities;Functional mobility training;Therapeutic exercise;Patient/family education;Manual techniques;Taping;Neuromuscular re-education;Dry needling    PT Next Visit Plan no traction or spinal mobs due to osteoporosis, continue/progress cervical flexion bias ROM, stretches, postural strengthening, manual for STM/myofascial release and modalities prn for pain, modify positioning as needed for nerve glides and shoulder strengthening due to pain with shoulder in abducted position    PT Home Exercise Plan Access code: [email or web address] Red/green supine scap stab, nerve glades (median and ulnar)    Consulted and Agree with Plan of Care Patient           Patient will benefit from skilled therapeutic intervention in order to improve the following deficits and impairments:  Impaired UE functional use, Increased fascial restricitons, Decreased strength, Decreased range of motion, Impaired sensation  Visit Diagnosis: Cervicalgia  Pain in thoracic spine     Problem List Patient Active Problem List   Diagnosis Date Noted  . Fear of side effects of medication 08/28/2019  . Upper airway cough syndrome 05/04/2019  . DOE (dyspnea on exertion) 05/03/2019  . 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 Osteoporosis 08/11/2017  . Abnormal glucose 06/25/2017  . Medication management 12/12/2014  . 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 GERD (gastroesophageal reflux disease)   . Vitamin D deficiency   . Hyperlipidemia, mixed     [name] 03/08/2020,  11:44 AM  [facility] [address]Phone: [phone]  Fax:  [phone] Name: WTedra CoppernollPhifer MRN: [id number] of Birth: 103/15/1952 JRaeford Garrett PT 03/08/20 11:44 AM Phone: [phone]: [phone]

## 2020-03-13 ENCOUNTER — Ambulatory Visit: Payer: Medicare Other | Admitting: Physical Therapy

## 2020-03-13 ENCOUNTER — Other Ambulatory Visit: Payer: Self-pay

## 2020-03-13 DIAGNOSIS — M542 Cervicalgia: Secondary | ICD-10-CM

## 2020-03-13 DIAGNOSIS — M546 Pain in thoracic spine: Secondary | ICD-10-CM

## 2020-03-14 NOTE — Therapy (Signed)
[place], [place], [address] Phone: [phone]   Fax:  [phone]  Physical Therapy Treatment  Patient Details  Name: [name] MRN: [id number] Date of Birth: 20-Oct-1950 Referring Provider (PT): Vicie Mutters, [place]   Encounter Date: 03/13/2020   PT End of Session - 03/13/20 0933    Visit Number 7    Number of Visits 12    Date for PT Re-Evaluation 03/29/20    Authorization Type UHC Medicare    PT Start Time [phone]    PT Stop Time 1000    PT Time Calculation (min) 44 min           Past Medical History:  Diagnosis Date  . Arthritis    cervical and lumbar  . Depression   . GERD (gastroesophageal reflux disease)   . Hyperlipidemia   . IBS (irritable bowel syndrome)   . Osteoporosis   . Prediabetes   . Vitamin D deficiency     Past Surgical History:  Procedure Laterality Date  . CARDIAC CATHETERIZATION  2011   normal (Dr. Terrence Dupont)  . RADIOACTIVE SEED GUIDED EXCISIONAL BREAST BIOPSY Right 03/11/2016   Procedure: RIGHT RADIOACTIVE SEED GUIDED EXCISIONAL BREAST BIOPSY;  Surgeon: Rolm Bookbinder, MD;  Location: [place];  Service: General;  Laterality: Right;    There were no vitals filed for this visit.   Subjective Assessment - 03/13/20 0931    Subjective Had some pain L arm getting undressed.              [facility] PT Assessment - 03/14/20 0001      AROM   Overall AROM Comments functional reach to mid thoracic and discomfort  all else [facility] in L UE , C spine WNL       Strength   Right Shoulder Flexion 4+/5    Right Shoulder ABduction 4+/5    Left Shoulder Flexion 4+/5    Left Shoulder ABduction 4+/5    Left Shoulder External Rotation 4+/5    Left Wrist Flexion 4+/5    Left Wrist Extension 4+/5    Right Hand Grip (lbs) 60    Left Hand Grip (lbs) 53             [facility] Adult PT Treatment/Exercise - 03/14/20 0001      Neck Exercises: Supine   Other Supine  Exercise supine scapular stabilization red band x 15 each : chest pull, ER/IR, and overhead narrow grip   mod cues    Other Supine Exercise diagonal pull x 10 each side red band       Shoulder Exercises: ROM/Strengthening   Nustep 6 min UE and LE L 4       Manual Therapy   Manual therapy comments scapular mobilizations, PROM L UE all planes     Soft tissue mobilization L rhomboid, upper trap, levator scap    Neural Stretch L UE median and ulnar N                        PT Long Term Goals - 03/13/20 2122      PT LONG TERM GOAL #1   Title Independent with HEP    Baseline met for initial HEP, will continue to update prn    Status Achieved      PT LONG TERM GOAL #2   Title Increase left grip strength at least 10 lbs. and increase left shoulder abd/ER and wrist strength  at least 1/1 MMT grade to improve ability for gripping and lifting activities for chores    Baseline Lt grip 53 lbs, shoulder    Status Achieved      PT LONG TERM GOAL #3   Title Increase left shoulder IR reach behind back to T10 or higher to improve ability for dressing and bathing    Baseline T 6, min pain    Status Achieved      PT LONG TERM GOAL #4   Title Increase right cervical rotation AROM at least 5-10 deg to improve ability to turn head while driving    Status Achieved      PT LONG TERM GOAL #5   Title Decrease neck/thoracic and left UE pain symptoms at least 60% or greater from baseline status to improve positional tolerance and ability for grooming/getting hair done, IADLs    Baseline Pain is intermittent, moderate, 50% better    Status On-going                 Plan - 03/13/20 0929    Clinical Impression Statement Patient making functional gains but still with pain in L scapular region extending to posterior shoulder and forearm , wrist. Symptoms consistent with trigger point referral/myofascial pain and /or neural tension.  She needed cues for HEP today. She tolerates exercise and  manual therapy to L UE well.  Making progress but due to chronic nature may take more time.    PT Treatment/Interventions Cryotherapy;ADLs/Self Care Home Management;Electrical Stimulation;Ultrasound;Iontophoresis 41m/ml Dexamethasone;Moist Heat;Therapeutic activities;Functional mobility training;Therapeutic exercise;Patient/family education;Manual techniques;Taping;Neuromuscular re-education;Dry needling    PT Next Visit Plan no traction or spinal mobs due to osteoporosis, continue/progress cervical flexion bias ROM, stretches, postural strengthening, manual for STM/myofascial release and modalities prn for pain, modify positioning as needed for nerve glides and shoulder strengthening due to pain with shoulder in abducted position    PT Home Exercise Plan Access code: [email or web address] Red/green supine scap stab, nerve glades (median and ulnar)    Consulted and Agree with Plan of Care Patient           Patient will benefit from skilled therapeutic intervention in order to improve the following deficits and impairments:  Impaired UE functional use, Increased fascial restricitons, Decreased strength, Decreased range of motion, Impaired sensation  Visit Diagnosis: Cervicalgia  Pain in thoracic spine     Problem List Patient Active Problem List   Diagnosis Date Noted  . Fear of side effects of medication 08/28/2019  . Upper airway cough syndrome 05/04/2019  . DOE (dyspnea on exertion) 05/03/2019  . 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 Osteoporosis 08/11/2017  . Abnormal glucose 06/25/2017  . Medication management 12/12/2014  . 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 GERD (gastroesophageal reflux disease)   . Vitamin D deficiency   . Hyperlipidemia, mixed     Jamarius Saha 03/14/2020, 8:17 AM  [place] [address]Phone: [phone]  Fax:  [phone] Name: [name] MRN: [id number] of Birth:  [birth year] JRaeford Razor PT 03/14/20 8:17 AM Phone: [phone]: [phone]

## 2020-03-15 ENCOUNTER — Ambulatory Visit: Payer: Medicare Other | Admitting: Physical Therapy

## 2020-03-15 ENCOUNTER — Encounter: Payer: Self-pay | Admitting: Physical Therapy

## 2020-03-15 ENCOUNTER — Other Ambulatory Visit: Payer: Self-pay

## 2020-03-15 DIAGNOSIS — M546 Pain in thoracic spine: Secondary | ICD-10-CM

## 2020-03-15 DIAGNOSIS — M542 Cervicalgia: Secondary | ICD-10-CM | POA: Diagnosis not present

## 2020-03-15 NOTE — Therapy (Signed)
[name], [place], [address] Phone: [phone]   Fax:  [phone]  Physical Therapy Treatment  Patient Details  Name: [name] MRN: [id number] Date of Birth: 06-12-1951 Referring Provider (PT): Vicie Mutters, [place]   Encounter Date: 03/15/2020   PT End of Session - 03/15/20 1202    Visit Number 8    Number of Visits 12    Date for PT Re-Evaluation 03/29/20    Authorization Type [employer] Medicare, progress note by visit 10    PT Start Time 1107    PT Stop Time 1146    PT Time Calculation (min) 39 min    Activity Tolerance Patient tolerated treatment well    Behavior During Therapy [facility] for tasks assessed/performed           Past Medical History:  Diagnosis Date  . Arthritis    cervical and lumbar  . Depression   . GERD (gastroesophageal reflux disease)   . Hyperlipidemia   . IBS (irritable bowel syndrome)   . Osteoporosis   . Prediabetes   . Vitamin D deficiency     Past Surgical History:  Procedure Laterality Date  . CARDIAC CATHETERIZATION  2011   normal (Dr. Terrence Dupont)  . RADIOACTIVE SEED GUIDED EXCISIONAL BREAST BIOPSY Right 03/11/2016   Procedure: RIGHT RADIOACTIVE SEED GUIDED EXCISIONAL BREAST BIOPSY;  Surgeon: Rolm Bookbinder, MD;  Location: [place];  Service: General;  Laterality: Right;    There were no vitals filed for this visit.   Subjective Assessment - 03/15/20 1115    Subjective Pt. 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 Mild left shoulder pain today 1/10 with discomfort with reaching up with left hand but overall reports symptoms improving.    Pertinent History arthritis, osteoporosis, IBS, depression    Currently in Pain? Yes    Pain Score 1     Pain Location Shoulder    Pain Orientation Left    Pain Descriptors / Indicators Aching    Pain Type  Chronic pain    Pain Onset More than a month ago    Pain Frequency Intermittent    Aggravating Factors  reaching motions    Pain Relieving Factors rest                             [facility] Adult PT Treatment/Exercise - 03/15/20 0001      Exercises   Exercises Shoulder      Neck Exercises: Supine   Neck Retraction 20 reps    Neck Retraction Limitations supine with gentle overpressure    Capital Flexion Limitations supine retraction to assisted cervical flexion stretch 2x10      Shoulder Exercises: Sidelying   External Rotation AROM;Strengthening;Left;20 reps    External Rotation Weight (lbs) 1      Shoulder Exercises: Standing   External Rotation AROM;Strengthening;Left;20 reps    Theraband Level (Shoulder External Rotation) Level 2 (Red)    Internal Rotation AROM;Strengthening;Left;20 reps    Theraband Level (Shoulder Internal Rotation) Level 3 (Green)    Extension AROM;Strengthening;Both;20 reps    Theraband Level (Shoulder Extension) Level 3 (Green)    Row Both;20 reps    Theraband Level (Shoulder Row) Level 4 (Blue)      Shoulder Exercises: ROM/Strengthening   UBE (Upper Arm Bike) L1 x 5 min wth 2.5 min ea.  fw/rev cues for neck posture to avoid extension      Manual Therapy   Soft tissue mobilization Left infraspinatus-trigger point ischemic compression    Myofascial Release suboccipital release    Neural Stretch Left median and ulnar nerve glides x 10 ea.      Neck Exercises: Stretches   Upper Trapezius Stretch Left;2 reps;30 seconds   supine manual stretch   Levator Stretch Left;2 reps;30 seconds   supine manual stretch                 PT Education - 03/15/20 1159    Education Details posture-avoid prolonged cervical extension when possible, pt. education also on shoulder/rotator cuff anatomy    Person(s) Educated Patient    Methods Explanation;Demonstration;Verbal cues    Comprehension Verbalized understanding               PT  Long Term Goals - 03/13/20 0942      PT LONG TERM GOAL #1   Title Independent with HEP    Baseline met for initial HEP, will continue to update prn    Status Achieved      PT LONG TERM GOAL #2   Title Increase left grip strength at least 10 lbs. and increase left shoulder abd/ER and wrist strength at least 1/1 MMT grade to improve ability for gripping and lifting activities for chores    Baseline Lt grip 53 lbs, shoulder    Status Achieved      PT LONG TERM GOAL #3   Title Increase left shoulder IR reach behind back to T10 or higher to improve ability for dressing and bathing    Baseline T 6, min pain    Status Achieved      PT LONG TERM GOAL #4   Title Increase right cervical rotation AROM at least 5-10 deg to improve ability to turn head while driving    Status Achieved      PT LONG TERM GOAL #5   Title Decrease neck/thoracic and left UE pain symptoms at least 60% or greater from baseline status to improve positional tolerance and ability for grooming/getting hair done, IADLs    Baseline Pain is intermittent, moderate, 50% better    Status On-going                 Plan - 03/15/20 1203    Clinical Impression Statement Pain with cervical extension as noted in subjective still concerning for underlying stenosis and continue to suspect local shoulder etiology with possible impingement as well given pain with shoulder abduction. Overall pt. still improving and making functional gains from baseline status so plan continue POC for therapy.    Personal Factors and Comorbidities Comorbidity 2    Comorbidities see PMH, osteoporosis, arthritis    Examination-Activity Limitations Hygiene/Grooming;Dressing;Lift;Reach Overhead;Carry;Sit    Examination-Participation Restrictions Cleaning    Stability/Clinical Decision Making Evolving/Moderate complexity    Clinical Decision Making Moderate    Rehab Potential Good    PT Frequency --   1-2x/week   PT Duration 6 weeks    PT  Treatment/Interventions Cryotherapy;ADLs/Self Care Home Management;Electrical Stimulation;Ultrasound;Iontophoresis 21m/ml Dexamethasone;Moist Heat;Therapeutic activities;Functional mobility training;Therapeutic exercise;Patient/family education;Manual techniques;Taping;Neuromuscular re-education;Dry needling    PT Next Visit Plan no traction or spinal mobs due to osteoporosis, continue/progress cervical flexion bias ROM, stretches, postural strengthening, manual for STM/myofascial release and modalities prn for pain, modify positioning as needed for nerve glides and shoulder strengthening due to pain with shoulder in abducted position    PT Home Exercise  Plan Access code: [email or web address]. Red/green supine scap stab, nerve glades (median and ulnar)    Consulted and Agree with Plan of Care Patient           Patient will benefit from skilled therapeutic intervention in order to improve the following deficits and impairments:  Impaired UE functional use, Increased fascial restricitons, Decreased strength, Decreased range of motion, Impaired sensation  Visit Diagnosis: Cervicalgia  Pain in thoracic spine     Problem List Patient Active Problem List   Diagnosis Date Noted  . Fear of side effects of medication 08/28/2019  . Upper airway cough syndrome 05/04/2019  . DOE (dyspnea on exertion) 05/03/2019  . 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 Osteoporosis 08/11/2017  . Abnormal glucose 06/25/2017  . Medication management 12/12/2014  . 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 GERD (gastroesophageal reflux disease)   . Vitamin D deficiency   . Hyperlipidemia, mixed     Jodi Dinning, PT, DPT 03/15/20 12:09 PM  [facility] [address] [place], [place], [address] Phone: [phone]   Fax:  [phone]  Name: [name] MRN: [id number] Date of Birth: 09-27-1950

## 2020-03-19 ENCOUNTER — Encounter: Payer: Self-pay | Admitting: Internal Medicine

## 2020-03-19 NOTE — Progress Notes (Signed)
Annual Screening/Preventative Visit & Comprehensive Evaluation &  Examination     This very nice 70 y.o.  MBF presents for a Screening /Preventative Visit & comprehensive evaluation and management of multiple medical co-morbidities.  Patient has been followed for HTN, HLD, Prediabetes  and Vitamin D Deficiency. Patient is followed by Dr Earlean Shawl for IBS-C & GERD - the latter is controlled on Prilosec bid & Famotidine.       Patient has hx/o Osteoporosis by dexaBMD (T-3.6 - 2019)  and patient declunes taking meds. More recent BMD found T -3.3 in lumbar spine.       Patient is followed expectantly for labile HTN.  Patient  had a Negative /Normal Cardiac Cath in 2011 by Dr Terrence Dupont. Patient's BP has been controlled at home and patient denies any cardiac symptoms as chest pain, palpitations, shortness of breath, dizziness or ankle swelling. Today's BP is at goal - 102/60.      Patient  is reticent to take statins and her hyperlipidemia is not controlled with diet and Ezetimibe. Patient denies myalgias or other medication SE's. Last lipids were not at goal:  Lab Results  Component Value Date   CHOL 201 (H) 11/21/2019   HDL 75 11/21/2019   LDLCALC 112 (H) 11/21/2019   TRIG 58 11/21/2019   CHOLHDL 2.7 11/21/2019       Patient has hx/o prediabetes(A1c 5.7% /2014 &6.1% /2016)  and patient denies reactive hypoglycemic symptoms, visual blurring, diabetic polys or paresthesias. Last A1c was Normal & at goal:  Lab Results  Component Value Date   HGBA1C 5.5 11/21/2019       Finally, patient has history of Vitamin D Deficiency and last Vitamin D was at goal:  Lab Results  Component Value Date   VD25OH 71 08/29/2019    Current Outpatient Medications on File Prior to Visit  Medication Sig  . aspirin 81 MG tablet Take 81 mg by mouth daily.  . Cholecalciferol (D-3-5) 5000 UNITS capsule Take 5,000 Units by mouth daily.  . cyclobenzaprine (FLEXERIL) 10 MG tablet Take 1 tablet (10 mg total) by  mouth 2 (two) times daily as needed for muscle spasms.  . cyproheptadine 4 MG tablet 1/2-1 tablet at bedtime  . ezetimibe  10 MG tablet Take 1 tablet    Daily    for Cholesterol  . famotidine  40 MG tablet Take 1 tablet by mouth once daily in the evening  . loratadine  10 MG tablet Take 1 tablet 1-2 times daily  . Lutein 20 MG TABS Take by mouth.  . Multiple Vitamins-Minerals Chew 1 tablet  as needed.  . Multiple Vitamins-Minerals  Take 1 tablet  daily.  Marland Kitchen omeprazole  40 MG capsule Take 1 capsule  twice daily  . RESTASIS 0.05 % ophth emulsion instill 1 drop into both eyes twice a day  . NASACORT AQ  Place into the nose.  . hyoscyamine  0.375 MG 12 hr tablet Take 2 x /da as needed     Allergies  Allergen Reactions  . Bactrim [Sulfamethoxazole-Trimethoprim] Other (See Comments)    Patient preference to take medication without sulfa  . Keflex [Cephalexin]     Rash; chest pain   Past Medical History:  Diagnosis Date  . Arthritis    cervical and lumbar  . Depression   . GERD (gastroesophageal reflux disease)   . Hyperlipidemia   . IBS (irritable bowel syndrome)   . Osteoporosis   . Prediabetes   . Vitamin D deficiency  Health Maintenance  Topic Date Due  . PNA vac Low Risk Adult (2 of 2 - PPSV23) 11/10/2019  . INFLUENZA VACCINE  01/15/2020  . MAMMOGRAM  01/26/2020  . COLONOSCOPY  10/17/2028  . TETANUS/TDAP  03/28/2029  . DEXA SCAN  Completed  . COVID-19 Vaccine  Completed  . Hepatitis C Screening  Completed     Last Colon - 10/18/2018 - Normal - Dr Earlean Shawl - Recc 10 yr f/u - due May 2030  Last MGM - 01/26/2019 Teola Bradley  Past Surgical History:  Procedure Laterality Date  . CARDIAC CATHETERIZATION  2011   normal (Dr. Terrence Dupont)  . RADIOACTIVE SEED GUIDED EXCISIONAL BREAST BIOPSY Right 03/11/2016   Procedure: RIGHT RADIOACTIVE SEED GUIDED EXCISIONAL BREAST BIOPSY;  Surgeon: Rolm Bookbinder, MD;  Location: [facility];  Service: General;  Laterality: Right;    Family History  Problem Relation Age of Onset  . Hypertension Mother   . Other Mother        surgeries involving upper back  . Diabetes Father   . Heart failure Father   . Cancer Maternal Grandmother        Lung  . Allergic rhinitis Neg Hx   . Angioedema Neg Hx   . Asthma Neg Hx   . Atopy Neg Hx   . Eczema Neg Hx   . Immunodeficiency Neg Hx   . Urticaria Neg Hx    Social History   Tobacco Use  . Smoking status: Never Smoker  . Smokeless tobacco: Never Used  Vaping Use  . Vaping Use: Never used  Substance Use Topics  . Alcohol use: Yes    Comment: Rare  . Drug use: No    ROS Constitutional: Denies fever, chills, weight loss/gain, headaches, insomnia,  night sweats, and change in appetite. Does c/o fatigue. Eyes: Denies redness, blurred vision, diplopia, discharge, itchy, watery eyes.  ENT: Denies discharge, congestion, post nasal drip, epistaxis, sore throat, earache, hearing loss, dental pain, Tinnitus, Vertigo, Sinus pain, snoring.  Cardio: Denies chest pain, palpitations, irregular heartbeat, syncope, dyspnea, diaphoresis, orthopnea, PND, claudication, edema Respiratory: denies cough, dyspnea, DOE, pleurisy, hoarseness, laryngitis, wheezing.  Gastrointestinal: Denies dysphagia, heartburn, reflux, water brash, pain, cramps, nausea, vomiting, bloating, diarrhea, constipation, hematemesis, melena, hematochezia, jaundice, hemorrhoids Genitourinary: Denies dysuria, frequency, urgency, nocturia, hesitancy, discharge, hematuria, flank pain Breast: Breast lumps, nipple discharge, bleeding.  Musculoskeletal: Denies arthralgia, myalgia, stiffness, Jt. Swelling, pain, limp, and strain/sprain. Denies falls. Skin: Denies puritis, rash, hives, warts, acne, eczema, changing in skin lesion Neuro: No weakness, tremor, incoordination, spasms, paresthesia, pain Psychiatric: Denies confusion, memory loss, sensory loss. Denies Depression. Endocrine: Denies change in weight, skin, hair  change, nocturia, and paresthesia, diabetic polys, visual blurring, hyper / hypo glycemic episodes.  Heme/Lymph: No excessive bleeding, bruising, enlarged lymph nodes.  Physical Exam  BP 102/60   Pulse (!) 53   Temp (!) 97 F (36.1 C)   Resp 16   Ht 5\' 5"  (1.651 m)   Wt 144 lb 6.4 oz (65.5 kg)   SpO2 97%   BMI 24.03 kg/m   General Appearance: Well nourished, well groomed and in no apparent distress.  Eyes: PERRLA, EOMs, conjunctiva no swelling or erythema, normal fundi and vessels. Sinuses: No frontal/maxillary tenderness ENT/Mouth: EACs patent / TMs  nl. Nares clear without erythema, swelling, mucoid exudates. Oral hygiene is good. No erythema, swelling, or exudate. Tongue normal, non-obstructing. Tonsils not swollen or erythematous. Hearing normal.  Neck: Supple, thyroid not palpable. No bruits, nodes or JVD. Respiratory:  Respiratory effort normal.  BS equal and clear bilateral without rales, rhonci, wheezing or stridor. Cardio: Heart sounds are normal with regular rate and rhythm and no murmurs, rubs or gallops. Peripheral pulses are normal and equal bilaterally without edema. No aortic or femoral bruits. Chest: symmetric with normal excursions and percussion. Breasts: Symmetric, without lumps, nipple discharge, retractions, or fibrocystic changes.  Abdomen: Flat, soft with bowel sounds active. Nontender, no guarding, rebound, hernias, masses, or organomegaly.  Lymphatics: Non tender without lymphadenopathy.  Genitourinary:  Musculoskeletal: Full ROM all peripheral extremities, joint stability, 5/5 strength, and normal gait. Skin: Warm and dry without rashes, lesions, cyanosis, clubbing or  ecchymosis.  Neuro: Cranial nerves intact, reflexes equal bilaterally. Normal muscle tone, no cerebellar symptoms. Sensation intact.  Pysch: Alert and oriented X 3, normal affect, Insight and Judgment appropriate.   Assessment and Plan  1. Annual Preventative Screening Examination   2.  Labile hypertension  - EKG 12-Lead - Urinalysis, Routine w reflex microscopic - Microalbumin / creatinine urine ratio - CBC with Differential/Platelet - COMPLETE METABOLIC PANEL WITH GFR - Magnesium - TSH  3. Hyperlipidemia, mixed  - EKG 12-Lead - Lipid panel - TSH  4. Glucose intolerance  - Hemoglobin A1c - Insulin, random  5. Vitamin D deficiency  - VITAMIN D 25 Hydroxy  6. Osteoporosis  - Patient is agreeable for Endo consultation   - COMPLETE METABOLIC PANEL WITH GFR  7. Screening for colorectal cancer  - POC Hemoccult Bld/Stl  8. Screening for ischemic heart disease  - EKG 12-Lead  9. FHx: heart disease  - EKG 12-Lead  10. Medication management  - Urinalysis, Routine w reflex microscopic - Microalbumin / creatinine urine ratio - CBC with Differential/Platelet - COMPLETE METABOLIC PANEL WITH GFR - Magnesium - Lipid panel - TSH - Hemoglobin A1c - Insulin, random - VITAMIN D 25 Hydroxy         Patient was counseled in prudent diet to achieve/maintain BMI , BP monitoring, regular exercise and medications. Discussed med's effects and SE's. Screening labs and tests as requested with regular follow-up as recommended. Over 40 minutes of exam, counseling, chart review and high complex critical decision making was performed.   Kirtland Bouchard, MD

## 2020-03-19 NOTE — Patient Instructions (Signed)

## 2020-03-20 ENCOUNTER — Ambulatory Visit: Payer: Medicare Other | Attending: Internal Medicine

## 2020-03-20 ENCOUNTER — Other Ambulatory Visit: Payer: Self-pay

## 2020-03-20 ENCOUNTER — Ambulatory Visit: Payer: Medicare Other | Admitting: Internal Medicine

## 2020-03-20 VITALS — BP 102/60 | HR 53 | Temp 97.0°F | Resp 16 | Ht 65.0 in | Wt 144.4 lb

## 2020-03-20 DIAGNOSIS — Z0000 Encounter for general adult medical examination without abnormal findings: Secondary | ICD-10-CM

## 2020-03-20 DIAGNOSIS — Z0001 Encounter for general adult medical examination with abnormal findings: Secondary | ICD-10-CM

## 2020-03-20 DIAGNOSIS — R0989 Other specified symptoms and signs involving the circulatory and respiratory systems: Secondary | ICD-10-CM | POA: Diagnosis not present

## 2020-03-20 DIAGNOSIS — Z1212 Encounter for screening for malignant neoplasm of rectum: Secondary | ICD-10-CM

## 2020-03-20 DIAGNOSIS — E559 Vitamin D deficiency, unspecified: Secondary | ICD-10-CM

## 2020-03-20 DIAGNOSIS — M810 Age-related osteoporosis without current pathological fracture: Secondary | ICD-10-CM

## 2020-03-20 DIAGNOSIS — R7309 Other abnormal glucose: Secondary | ICD-10-CM

## 2020-03-20 DIAGNOSIS — Z23 Encounter for immunization: Secondary | ICD-10-CM

## 2020-03-20 DIAGNOSIS — E782 Mixed hyperlipidemia: Secondary | ICD-10-CM

## 2020-03-20 DIAGNOSIS — Z79899 Other long term (current) drug therapy: Secondary | ICD-10-CM

## 2020-03-20 DIAGNOSIS — E7439 Other disorders of intestinal carbohydrate absorption: Secondary | ICD-10-CM

## 2020-03-20 DIAGNOSIS — Z8249 Family history of ischemic heart disease and other diseases of the circulatory system: Secondary | ICD-10-CM | POA: Diagnosis not present

## 2020-03-20 DIAGNOSIS — Z136 Encounter for screening for cardiovascular disorders: Secondary | ICD-10-CM

## 2020-03-20 NOTE — Progress Notes (Signed)
   Covid-19 Vaccination Clinic  Name:  Aunya Lemler Dombek    MRN: [id number] DOB: 1951-01-06  03/20/2020  Ms. Antrim was observed post Covid-19 immunization for 15 minutes without incident. She was provided with Vaccine Information Sheet and instruction to access the V-Safe system.   Ms. Mcavoy was instructed to call 911 with any severe reactions post vaccine: Marland Kitchen Difficulty breathing  . Swelling of face and throat  . A fast heartbeat  . A bad rash all over body  . Dizziness and weakness

## 2020-03-21 LAB — COMPLETE METABOLIC PANEL WITH GFR
AG Ratio: 1.6 (calc) (ref 1.0–2.5)
ALT: 10 U/L (ref 6–29)
AST: 16 U/L (ref 10–35)
Albumin: 3.9 g/dL (ref 3.6–5.1)
Alkaline phosphatase (APISO): 80 U/L (ref 37–153)
BUN: 13 mg/dL (ref 7–25)
CO2: 26 mmol/L (ref 20–32)
Calcium: 9.5 mg/dL (ref 8.6–10.4)
Chloride: 105 mmol/L (ref 98–110)
Creat: 0.8 mg/dL (ref 0.50–0.99)
GFR, Est African American: 88 mL/min/{1.73_m2} (ref >=60)
GFR, Est Non African American: 76 mL/min/{1.73_m2} (ref >=60)
Globulin: 2.5 g/dL (calc) (ref 1.9–3.7)
Glucose, Bld: 88 mg/dL (ref 65–99)
Potassium: 4 mmol/L (ref 3.5–5.3)
Sodium: 138 mmol/L (ref 135–146)
Total Bilirubin: 0.4 mg/dL (ref 0.2–1.2)
Total Protein: 6.4 g/dL (ref 6.1–8.1)

## 2020-03-21 LAB — CBC WITH DIFFERENTIAL/PLATELET
Absolute Monocytes: 300 cells/uL (ref 200–950)
Basophils Absolute: 19 cells/uL (ref 0–200)
Basophils Relative: 0.7 %
Eosinophils Absolute: 111 cells/uL (ref 15–500)
Eosinophils Relative: 4.1 %
HCT: 36.6 % (ref 35.0–45.0)
Hemoglobin: 12.2 g/dL (ref 11.7–15.5)
Lymphs Abs: 1339 cells/uL (ref 850–3900)
MCH: 28.7 pg (ref 27.0–33.0)
MCHC: 33.3 g/dL (ref 32.0–36.0)
MCV: 86.1 fL (ref 80.0–100.0)
MPV: 11.8 fL (ref 7.5–12.5)
Monocytes Relative: 11.1 %
Neutro Abs: 932 cells/uL — ABNORMAL LOW (ref 1500–7800)
Neutrophils Relative %: 34.5 %
Platelets: 173 10*3/uL (ref 140–400)
RBC: 4.25 10*6/uL (ref 3.80–5.10)
RDW: 12.8 % (ref 11.0–15.0)
Total Lymphocyte: 49.6 %
WBC: 2.7 10*3/uL — ABNORMAL LOW (ref 3.8–10.8)

## 2020-03-21 LAB — URINALYSIS, ROUTINE W REFLEX MICROSCOPIC
Bacteria, UA: NONE SEEN /HPF
Bilirubin Urine: NEGATIVE
Glucose, UA: NEGATIVE
Hgb urine dipstick: NEGATIVE
Hyaline Cast: NONE SEEN /LPF
Ketones, ur: NEGATIVE
Nitrite: NEGATIVE
Protein, ur: NEGATIVE
RBC / HPF: NONE SEEN /HPF (ref 0–2)
Specific Gravity, Urine: 1.007 (ref 1.001–1.03)
Squamous Epithelial / HPF: NONE SEEN /HPF (ref <=5)
pH: 7 (ref 5.0–8.0)

## 2020-03-21 LAB — LIPID PANEL
Cholesterol: 195 mg/dL (ref <200)
HDL: 69 mg/dL (ref >=50)
LDL Cholesterol (Calc): 111 mg/dL (calc) — ABNORMAL HIGH
Non-HDL Cholesterol (Calc): 126 mg/dL (calc) (ref <130)
Total CHOL/HDL Ratio: 2.8 (calc) (ref <5.0)
Triglycerides: 60 mg/dL (ref <150)

## 2020-03-21 LAB — HEMOGLOBIN A1C
Hgb A1c MFr Bld: 5.7 % of total Hgb — ABNORMAL HIGH (ref <5.7)
Mean Plasma Glucose: 117 (calc)
eAG (mmol/L): 6.5 (calc)

## 2020-03-21 LAB — TSH: TSH: 1.58 mIU/L (ref 0.40–4.50)

## 2020-03-21 LAB — MICROALBUMIN / CREATININE URINE RATIO
Creatinine, Urine: 43 mg/dL (ref 20–275)
Microalb, Ur: <0.2 mg/dL

## 2020-03-21 LAB — MAGNESIUM: Magnesium: 2.1 mg/dL (ref 1.5–2.5)

## 2020-03-21 LAB — VITAMIN D 25 HYDROXY (VIT D DEFICIENCY, FRACTURES): Vit D, 25-Hydroxy: 83 ng/mL (ref 30–100)

## 2020-03-21 LAB — INSULIN, RANDOM: Insulin: 4 u[IU]/mL

## 2020-03-21 NOTE — Progress Notes (Signed)
==========================================================  -    Total Chol = 195 - slightly elevated  (Ideal or Goal is less than 180)   - and   - LDL Chol = 111 - also elevated  (Ideal or Goal is less than 70)   - Continue Zetia, But also ................  - Recommend Rx for very low dose Crestor to help prevent   Heart Attack, Stroke and Vascular Dementia. ==========================================================  -  A1c = 5.7%   - B lood sugar and A1c are borderline elevated  in the   early or pre-diabetes range which has the same   300% increased risk for heart attack, stroke, cancer and   Alzheimer- type vascular dementia as full blown diabetes.   But the good news is that diet, exercise with  weight loss can cure the early diabetes at this point. ==========================================================  -  Vitamin D = 83 - Excellent  ==========================================================  -  All Else - CBC - Kidneys - U/A - Electrolytes - Liver  - Magnesium & Thyroid    - all  Normal / OK ===========================================================

## 2020-03-23 ENCOUNTER — Ambulatory Visit: Payer: Medicare Other | Admitting: Physical Therapy

## 2020-03-28 ENCOUNTER — Other Ambulatory Visit: Payer: Self-pay | Admitting: Internal Medicine

## 2020-03-28 DIAGNOSIS — E782 Mixed hyperlipidemia: Secondary | ICD-10-CM

## 2020-03-28 MED ORDER — ROSUVASTATIN CALCIUM 5 MG PO TABS: ORAL_TABLET | ORAL | 0 refills | Status: DC

## 2020-04-03 ENCOUNTER — Encounter: Payer: Medicare Other | Admitting: Physical Therapy

## 2020-04-05 ENCOUNTER — Encounter: Payer: Medicare Other | Admitting: Physical Therapy

## 2020-04-13 ENCOUNTER — Encounter: Payer: Self-pay | Admitting: Obstetrics and Gynecology

## 2020-04-13 ENCOUNTER — Other Ambulatory Visit: Payer: Self-pay

## 2020-04-13 ENCOUNTER — Ambulatory Visit: Payer: Medicare Other | Admitting: Obstetrics and Gynecology

## 2020-04-13 VITALS — BP 110/68 | Ht 65.0 in | Wt 141.0 lb

## 2020-04-13 DIAGNOSIS — Z01419 Encounter for gynecological examination (general) (routine) without abnormal findings: Secondary | ICD-10-CM

## 2020-04-13 DIAGNOSIS — N898 Other specified noninflammatory disorders of vagina: Secondary | ICD-10-CM

## 2020-04-13 DIAGNOSIS — M810 Age-related osteoporosis without current pathological fracture: Secondary | ICD-10-CM | POA: Diagnosis not present

## 2020-04-13 DIAGNOSIS — R109 Unspecified abdominal pain: Secondary | ICD-10-CM | POA: Diagnosis not present

## 2020-04-13 DIAGNOSIS — R300 Dysuria: Secondary | ICD-10-CM

## 2020-04-13 DIAGNOSIS — Z124 Encounter for screening for malignant neoplasm of cervix: Secondary | ICD-10-CM

## 2020-04-13 LAB — WET PREP FOR TRICH, YEAST, CLUE

## 2020-04-13 NOTE — Progress Notes (Signed)
Azalyn Sliwa Haddix 1950-11-24 [id number]  SUBJECTIVE:  69 y.o. G0P0000 female here for a breast and pelvic exam and Pap smear.  Noting a brownish yellow discharge and urinary discomfort. Also with ongoing abdominal cramping for which she has been evaluated 05/06/2019 and 01/03/2020. She has not yet seen a gastroenterologist regarding the symptoms. She has no gynecologic concerns.  Current Outpatient Medications  Medication Sig Dispense Refill  . aspirin 81 MG tablet Take 81 mg by mouth daily.    . Cholecalciferol (D-3-5) 5000 UNITS capsule Take 5,000 Units by mouth daily.    . cyclobenzaprine (FLEXERIL) 10 MG tablet Take 1 tablet (10 mg total) by mouth 2 (two) times daily as needed for muscle spasms. 20 tablet 0  . cyproheptadine (PERIACTIN) 4 MG tablet 1/2-1 tablet at bedtime 30 tablet 5  . ezetimibe (ZETIA) 10 MG tablet Take 1 tablet    Daily    for Cholesterol 90 tablet 0  . famotidine (PEPCID) 40 MG tablet Take 1 tablet by mouth once daily in the evening 30 tablet 5  . hyoscyamine (LEVBID) 0.375 MG 12 hr tablet Take by mouth.    . loratadine (CLARITIN) 10 MG tablet Take 1 tablet 1-2 times daily 60 tablet 3  . Lutein 20 MG TABS Take by mouth.    . Multiple Vitamins-Minerals (AIRBORNE) CHEW Chew 1 tablet by mouth as needed.    . Multiple Vitamins-Minerals (WOMENS MULTI VITAMIN & MINERAL PO) Take 1 tablet by mouth daily.    Marland Kitchen omeprazole (PRILOSEC) 40 MG capsule Take 1 capsule by mouth twice daily 60 capsule 5  . OVER THE COUNTER MEDICATION Patient takes MOM as needed.    . RESTASIS 0.05 % ophthalmic emulsion instill 1 drop into both eyes twice a day  0  . rosuvastatin (CRESTOR) 5 MG tablet Take     1 tablet     Daily      for Cholesterol 90 tablet 0  . Triamcinolone Acetonide (NASACORT AQ NA) Place into the nose.     No current facility-administered medications for this visit.   Allergies: Bactrim [sulfamethoxazole-trimethoprim] and Keflex [cephalexin]  No LMP recorded. Patient is  postmenopausal.  Past medical history,surgical history, problem list, medications, allergies, family history and social history were all reviewed and documented as reviewed in the EPIC chart.  GYN ROS: no abnormal bleeding, pelvic pain or discharge, no breast pain or new or enlarging lumps on self exam.  No dysuria, urinary frequency, pain with urination, cloudy/malodorous urine.   OBJECTIVE:  BP 110/68 (BP Location: Right Arm, Patient Position: Sitting, Cuff Size: Normal)   Ht 5\' 5"  (1.651 m)   Wt 141 lb (64 kg)   BMI 23.46 kg/m  The patient appears well, alert, oriented, in no distress.  BREAST EXAM: breasts appear normal, no suspicious masses, no skin or nipple changes or axillary nodes  PELVIC EXAM: VULVA: normal appearing vulva with atrophic change, no masses, tenderness or lesions, VAGINA: normal appearing vagina with atrophic change, normal color and scant amount of thick yellowish discharge without odor, no lesions, CERVIX: normal appearing atrophic cervix without discharge or lesions, UTERUS: uterus is normal size, shape, consistency and nontender, ADNEXA: normal adnexa in size, nontender and no masses, PAP: Pap smear done today, thin-prep method, WET MOUNT done - results: negative for pathogens, normal epithelial cells Urinalysis 6-10 WBC, 0-2 RBC, 0-5 squamous epithelial cells, few bacteria, no yeast, 1+ leukocyte esterase, negative nitrite  Chaperone: Wandra Scot Bonham present during the examination  ASSESSMENT:  68  y.o. G0P0000 here for a breast and pelvic exam  PLAN:   1. Postmenopausal. Significant hot flashes or night sweats. No vaginal bleeding. 2. Abdominal cramping, dysuria. Intermittent. Unremarkable pelvic ultrasound with tiny uterine fibroids last year with normal adnexa and ovaries. Reassuring normal pelvic exam without tenderness today. Negative vaginal wet mount and UA is unremarkable. She indicates a history of IBS and I recommended that she discuss her symptoms  with her GI specialist at her upcoming appointment in November. 3. Pap smear 2018. No significant history of abnormal Pap smears. Discussed screening guidelines and she is uncomfortable with discontinuing screening so we did collect a Pap smear today.  4. Vaginal discharge. This is due to postmenopausal atrophic discharge as the vaginal wet mount is negative. Patient is reassured. Could consider vaginal probiotic tablets to help reestablish vaginal pH if this becomes a recurrent issue. 5. Mammogram 01/2020.  Normal breast exam today. She will continue with annual mammograms.  6. Colonoscopy 2020.  She will follow up at the interval recommended by her GI specialist.   7. Osteoporosis.  DEXA 01/2020.  T score -3.3. She is being followed by her primary care provider for bone health maintenance. 8. Health maintenance.  No labs today as she normally has these completed elsewhere.  Return annually or sooner, prn.  Joseph Pierini MD 04/13/20

## 2020-04-13 NOTE — Addendum Note (Signed)
Addended by: Lorine Bears on: 04/13/2020 09:38 AM   Modules accepted: Orders

## 2020-04-15 LAB — URINALYSIS, COMPLETE W/RFL CULTURE
Bilirubin Urine: NEGATIVE
Glucose, UA: NEGATIVE
Hyaline Cast: NONE SEEN /LPF
Ketones, ur: NEGATIVE
Nitrites, Initial: NEGATIVE
Protein, ur: NEGATIVE
Specific Gravity, Urine: 1.02 (ref 1.001–1.03)
pH: 6 (ref 5.0–8.0)

## 2020-04-15 LAB — URINE CULTURE
MICRO NUMBER:: 11136618
SPECIMEN QUALITY:: ADEQUATE

## 2020-04-15 LAB — CULTURE INDICATED

## 2020-04-17 LAB — PAP IG W/ RFLX HPV ASCU

## 2020-04-24 ENCOUNTER — Encounter: Payer: Self-pay | Admitting: Adult Health

## 2020-04-24 ENCOUNTER — Ambulatory Visit: Payer: Medicare Other | Admitting: Adult Health

## 2020-04-24 ENCOUNTER — Other Ambulatory Visit: Payer: Self-pay

## 2020-04-24 VITALS — BP 94/60 | HR 77 | Temp 97.7°F | Wt 146.0 lb

## 2020-04-24 DIAGNOSIS — J0140 Acute pansinusitis, unspecified: Secondary | ICD-10-CM

## 2020-04-24 DIAGNOSIS — Z9109 Other allergy status, other than to drugs and biological substances: Secondary | ICD-10-CM

## 2020-04-24 MED ORDER — PREDNISONE 20 MG PO TABS: ORAL_TABLET | ORAL | 0 refills | Status: DC

## 2020-04-24 NOTE — Progress Notes (Signed)
Assessment and Plan:  [name] was seen today for headache.  Diagnoses and all orders for this visit:  Subacute pansinusitis Environmental allergies Fairly mild but persistent sinus tenderness,  Suspect underlying allergies contributing She would strongly prefer to avoid abx if possible Suggested symptomatic OTC remedies. Nasal saline spray for congestion,   Restart nasal steroids, continue allergy pill, oral steroids offered Follow up as needed if persistent sx, not improving, will consider doxycycline due to recent -     predniSONE (DELTASONE) 20 MG tablet; 2 tablets daily for 3 days, 1 tablet daily for 4 days.  Further disposition pending results of labs. Discussed med's effects and SE's.   Over 30 minutes of exam, counseling, chart review, and critical decision making was performed.   Future Appointments  Date Time Provider [place]  05/02/2020  9:50 AM Shamleffer, Melanie Crazier, MD [facility] None  06/25/2020  9:30 AM Liane Comber, NP [facility] None  08/21/2020  9:00 AM Kozlow, Donnamarie Poag, MD [facility] None  10/02/2020  9:30 AM Unk Pinto, MD [facility] None  04/03/2021 10:00 AM Unk Pinto, MD [facility] None  04/23/2021  8:00 AM Joseph Pierini, MD [facility]    ------------------------------------------------------------------------------------------------------------------   HPI BP 94/60    Pulse 77    Temp 97.7 F (36.5 C)    Wt 146 lb (66.2 kg)    SpO2 99%    BMI 24.30 kg/m   69 y.o.female presents for evaluation of headache and sinus tenderness.   She reports has been having some sx since mid Oct. She reports did have infected dental root that she underwent procedure for 10/19 which did improve her tooth ache, was advised xray showed some signs of inflammation, she notes has had persistent bil frontal and maxillary pressure/intense since then.   She denies blurry vision, dizziness, denies fevers, has endorsed chills but unsure if this is simply due  to cold weather as she is cold natured. She endorses runny nose.   She does endorse environmental allergies, treated by Allergist, takes loratadine year round, has used nasal sprays in the past but not recently. ? Nasocort.   She has tried tylenol which helps with pain for 3 hours, but won't take it away.  She notes she has been on several antibiotics in the last year, last took amoxicillin 875 mg BID with her dental procedure.    Past Medical History:  Diagnosis Date   Arthritis    cervical and lumbar   Depression    GERD (gastroesophageal reflux disease)    Hyperlipidemia    IBS (irritable bowel syndrome)    Osteoporosis    Prediabetes    Vitamin D deficiency      Allergies  Allergen Reactions   Bactrim [Sulfamethoxazole-Trimethoprim] Other (See Comments)    Patient preference to take medication without sulfa   Keflex [Cephalexin]     Rash; chest pain    Current Outpatient Medications on File Prior to Visit  Medication Sig   aspirin 81 MG tablet Take 81 mg by mouth daily.   Cholecalciferol (D-3-5) 5000 UNITS capsule Take 5,000 Units by mouth daily.   cyclobenzaprine (FLEXERIL) 10 MG tablet Take 1 tablet (10 mg total) by mouth 2 (two) times daily as needed for muscle spasms.   cyproheptadine (PERIACTIN) 4 MG tablet 1/2-1 tablet at bedtime   ezetimibe (ZETIA) 10 MG tablet Take 1 tablet    Daily    for Cholesterol   famotidine (PEPCID) 40 MG tablet Take 1 tablet by mouth once daily in  the evening   loratadine (CLARITIN) 10 MG tablet Take 1 tablet 1-2 times daily   Lutein 20 MG TABS Take by mouth.   Multiple Vitamins-Minerals (AIRBORNE) CHEW Chew 1 tablet by mouth as needed.   Multiple Vitamins-Minerals (WOMENS MULTI VITAMIN & MINERAL PO) Take 1 tablet by mouth daily.   omeprazole (PRILOSEC) 40 MG capsule Take 1 capsule by mouth twice daily   OVER THE COUNTER MEDICATION Patient takes MOM as needed.   RESTASIS 0.05 % ophthalmic emulsion instill 1 drop  into both eyes twice a day   Triamcinolone Acetonide (NASACORT AQ NA) Place into the nose.   hyoscyamine (LEVBID) 0.375 MG 12 hr tablet Take by mouth.   rosuvastatin (CRESTOR) 5 MG tablet Take     1 tablet     Daily      for Cholesterol (Patient not taking: Reported on 04/24/2020)   No current facility-administered medications on file prior to visit.    ROS: all negative except above.   Physical Exam:  BP 94/60    Pulse 77    Temp 97.7 F (36.5 C)    Wt 146 lb (66.2 kg)    SpO2 99%    BMI 24.30 kg/m   General Appearance: Well nourished, in no apparent distress. Eyes: PERRLA, EOMs, conjunctiva no swelling or erythema Sinuses: Mild generalized Frontal/maxillary pressure with palpation- denies local tenderness ENT/Mouth: Ext aud canals clear, TMs without erythema, bulging. No erythema, swelling, or exudate on post pharynx.  Tonsils not swollen or erythematous. Hearing normal. Gingiva non-inflamed, no abscess. Uvula midline. No sig TMJ tenderness.  Neck: Supple Respiratory: Respiratory effort normal, BS equal bilaterally without rales, rhonchi, wheezing or stridor.  Cardio: RRR with no MRGs. Brisk peripheral pulses without edema.  Abdomen: Soft, + BS.  Non tender Lymphatics: Non tender without lymphadenopathy.  Musculoskeletal: Full ROM, 5/5 strength, normal gait.  Skin: Warm, dry without rashes, lesions, ecchymosis.  Neuro: Cranial nerves intact. Normal muscle tone, no cerebellar symptoms. Sensation intact.  Psych: Awake and oriented X 3, normal affect, Insight and Judgment appropriate.     Izora Ribas, NP 10:20 AM [facility]

## 2020-04-24 NOTE — Patient Instructions (Addendum)
Start prednisone taper  Then - once symptoms are improving start Nasacort 1 spray in each nostril twice daily for 1-2 weeks until sinus sympmtos are improving, then reduce to once daily      Medicines you can use  Nasal congestion  Little Remedies saline spray (aerosol/mist)- can try this, it is in the kids section - pseudoephedrine (Sudafed)- behind the counter, do not use if you have high blood pressure, medicine that have -D in them.  - phenylephrine (Sudafed PE) -Dextormethorphan + chlorpheniramine (Coridcidin HBP)- okay if you have high blood pressure -Oxymetazoline (Afrin) nasal spray- LIMIT to 3 days -Saline nasal spray -Neti pot (used distilled or bottled water)  Ear pain/congestion  -pseudoephedrine (sudafed) - Nasonex/flonase nasal spray  Body Aches  -Acetaminophen (Tyelnol) -Ibuprofen (Advil, motrin, aleve)  Headache  -Acetaminophen (Tyelnol) -Ibuprofen (Advil, motrin, aleve) - Exedrin, Exedrin Migraine     High-Fiber Diet Fiber, also called dietary fiber, is a type of carbohydrate that is found in fruits, vegetables, whole grains, and beans. A high-fiber diet can have many health benefits. Your health care provider may recommend a high-fiber diet to help:  Prevent constipation. Fiber can make your bowel movements more regular.  Lower your cholesterol.  Relieve the following conditions: ? Swelling of veins in the anus (hemorrhoids). ? Swelling and irritation (inflammation) of specific areas of the digestive tract (uncomplicated diverticulosis). ? A problem of the large intestine (colon) that sometimes causes pain and diarrhea (irritable bowel syndrome, IBS).  Prevent overeating as part of a weight-loss plan.  Prevent heart disease, type 2 diabetes, and certain cancers. What is my plan? The recommended daily fiber intake in grams (g) includes:  38 g for men age 67 or younger.  30 g for men over age 82.  30 g for women age 64 or younger.  21 g for  women over age 38. You can get the recommended daily intake of dietary fiber by:  Eating a variety of fruits, vegetables, grains, and beans.  Taking a fiber supplement, if it is not possible to get enough fiber through your diet. What do I need to know about a high-fiber diet?  It is better to get fiber through food sources rather than from fiber supplements. There is not a lot of research about how effective supplements are.  Always check the fiber content on the nutrition facts label of any prepackaged food. Look for foods that contain 5 g of fiber or more per serving.  Talk with a diet and nutrition specialist (dietitian) if you have questions about specific foods that are recommended or not recommended for your medical condition, especially if those foods are not listed below.  Gradually increase how much fiber you consume. If you increase your intake of dietary fiber too quickly, you may have bloating, cramping, or gas.  Drink plenty of water. Water helps you to digest fiber. What are tips for following this plan?  Eat a wide variety of high-fiber foods.  Make sure that half of the grains that you eat each day are whole grains.  Eat breads and cereals that are made with whole-grain flour instead of refined flour or white flour.  Eat brown rice, bulgur wheat, or millet instead of white rice.  Start the day with a breakfast that is high in fiber, such as a cereal that contains 5 g of fiber or more per serving.  Use beans in place of meat in soups, salads, and pasta dishes.  Eat high-fiber snacks, such as berries,  raw vegetables, nuts, and popcorn.  Choose whole fruits and vegetables instead of processed forms like juice or sauce. What foods can I eat?  Fruits Berries. Pears. Apples. Oranges. Avocado. Prunes and raisins. Dried figs. Vegetables Sweet potatoes. Spinach. Kale. Artichokes. Cabbage. Broccoli. Cauliflower. Green peas. Carrots. Squash. Grains Whole-grain breads.  Multigrain cereal. Oats and oatmeal. Brown rice. Barley. Bulgur wheat. [place]. Quinoa. Bran muffins. Popcorn. Rye wafer crackers. Meats and other proteins Navy, kidney, and pinto beans. Soybeans. Split peas. Lentils. Nuts and seeds. Dairy Fiber-fortified yogurt. Beverages Fiber-fortified soy milk. Fiber-fortified orange juice. Other foods Fiber bars. The items listed above may not be a complete list of recommended foods and beverages. Contact a dietitian for more options. What foods are not recommended? Fruits Fruit juice. Cooked, strained fruit. Vegetables Fried potatoes. Canned vegetables. Well-cooked vegetables. Grains White bread. Pasta made with refined flour. White rice. Meats and other proteins Fatty cuts of meat. Fried chicken or fried fish. Dairy Milk. Yogurt. Cream cheese. Sour cream. Fats and oils Butters. Beverages Soft drinks. Other foods Cakes and pastries. The items listed above may not be a complete list of foods and beverages to avoid. Contact a dietitian for more information. Summary  Fiber is a type of carbohydrate. It is found in fruits, vegetables, whole grains, and beans.  There are many health benefits of eating a high-fiber diet, such as preventing constipation, lowering blood cholesterol, helping with weight loss, and reducing your risk of heart disease, diabetes, and certain cancers.  Gradually increase your intake of fiber. Increasing too fast can result in cramping, bloating, and gas. Drink plenty of water while you increase your fiber.  The best sources of fiber include whole fruits and vegetables, whole grains, nuts, seeds, and beans. This information is not intended to replace advice given to you by your health care provider. Make sure you discuss any questions you have with your health care provider. Document Revised: 04/06/2017 Document Reviewed: 04/06/2017 Elsevier Patient Education  2020 [employer].

## 2020-05-01 ENCOUNTER — Encounter: Payer: Self-pay | Admitting: Internal Medicine

## 2020-05-02 ENCOUNTER — Ambulatory Visit: Payer: Medicare Other | Admitting: Internal Medicine

## 2020-05-02 ENCOUNTER — Other Ambulatory Visit: Payer: Self-pay

## 2020-05-02 ENCOUNTER — Encounter: Payer: Self-pay | Admitting: Internal Medicine

## 2020-05-02 VITALS — BP 100/60 | HR 64 | Ht 65.0 in | Wt 143.4 lb

## 2020-05-02 DIAGNOSIS — M810 Age-related osteoporosis without current pathological fracture: Secondary | ICD-10-CM

## 2020-05-02 MED ORDER — ALENDRONATE SODIUM 70 MG PO TABS: 70.0000 mg | ORAL_TABLET | ORAL | 3 refills | Status: DC

## 2020-05-02 NOTE — Patient Instructions (Addendum)
Decrease Vitamin D 5000 iu daily  Calcium 1200 mg daily  Or 3 servings of calcium daily  Start Fosamax ( Alendronate) 70 mg once

## 2020-05-02 NOTE — Progress Notes (Signed)
Name: [name]  MRN/ DOB: [id number], 04-12-1951    Age/ Sex: 69 y.o., female    PCP: Unk Pinto, MD   Reason for Endocrinology Evaluation: Osteporosis     Date of Initial Endocrinology Evaluation: 05/02/2020     HPI: [name] is a 69 y.o. female with a past medical history of Dyslipidemia, and GERD. The patient presented for initial endocrinology clinic visit on 05/02/2020 for consultative assistance with her Osteoporosis .   Pt was diagnosed with osteoporosis: 2019  Menarche at age : 65 Menopausal at age : 81 Fracture Hx: no Hx of HRT: no FH of osteoporosis or hip fracture: N/A Prior Hx of anti-estrogenic therapy : no  Prior Hx of anti-resorptive therapy : no   She is on Vitamin D  Monday, wednesday and Friday 10,000, rest of the week if 5000 iu daily  Not compliant with calcium due to constipation , avoid dairy due to high cholesterol.    Has GERD but its pretty much controlled with Pepcid as well as Mylanta     HISTORY:  Past Medical History:  Past Medical History:  Diagnosis Date  . Arthritis    cervical and lumbar  . Depression   . GERD (gastroesophageal reflux disease)   . Hyperlipidemia   . IBS (irritable bowel syndrome)   . Osteoporosis   . Prediabetes   . Vitamin D deficiency    Past Surgical History:  Past Surgical History:  Procedure Laterality Date  . CARDIAC CATHETERIZATION  2011   normal (Dr. Terrence Dupont)  . RADIOACTIVE SEED GUIDED EXCISIONAL BREAST BIOPSY Right 03/11/2016   Procedure: RIGHT RADIOACTIVE SEED GUIDED EXCISIONAL BREAST BIOPSY;  Surgeon: Rolm Bookbinder, MD;  Location: [facility];  Service: General;  Laterality: Right;      Social History:  reports that she has never smoked. She has never used smokeless tobacco. She reports previous alcohol use. She reports that she does not use drugs.  Family History: family history includes Cancer in her maternal grandmother; Diabetes in her father; Heart  failure in her father; Hypertension in her mother; Other in her mother.   HOME MEDICATIONS: Allergies as of 05/02/2020      Reactions   Bactrim [sulfamethoxazole-trimethoprim] Other (See Comments)   Patient preference to take medication without sulfa   Keflex [cephalexin]    Rash; chest pain      Medication List       Accurate as of May 02, 2020 10:10 AM. If you have any questions, ask your nurse or doctor.        aspirin 81 MG tablet Take 81 mg by mouth daily.   cyclobenzaprine 10 MG tablet Commonly known as: FLEXERIL Take 1 tablet (10 mg total) by mouth 2 (two) times daily as needed for muscle spasms.   cyproheptadine 4 MG tablet Commonly known as: PERIACTIN 1/2-1 tablet at bedtime   D-3-5 125 MCG (5000 UT) capsule Generic drug: Cholecalciferol Take 5,000 Units by mouth daily.   ezetimibe 10 MG tablet Commonly known as: ZETIA Take 1 tablet    Daily    for Cholesterol   famotidine 40 MG tablet Commonly known as: PEPCID Take 1 tablet by mouth once daily in the evening   hyoscyamine 0.375 MG 12 hr tablet Commonly known as: LEVBID Take by mouth.   loratadine 10 MG tablet Commonly known as: CLARITIN Take 1 tablet 1-2 times daily   Lutein 20 MG Tabs Take by mouth.   NASACORT AQ NA  Place into the nose.   omeprazole 40 MG capsule Commonly known as: PRILOSEC Take 1 capsule by mouth twice daily   [place] Patient takes MOM as needed.   predniSONE 20 MG tablet Commonly known as: DELTASONE 2 tablets daily for 3 days, 1 tablet daily for 4 days.   Restasis 0.05 % ophthalmic emulsion Generic drug: cycloSPORINE instill 1 drop into both eyes twice a day   rosuvastatin 5 MG tablet Commonly known as: Crestor Take     1 tablet     Daily      for Cholesterol   WOMENS MULTI VITAMIN & MINERAL PO Take 1 tablet by mouth daily.   Airborne Google 1 tablet by mouth as needed.         REVIEW OF SYSTEMS: A comprehensive ROS was  conducted with the patient and is negative except as per HPI and below:  Review of Systems  Gastrointestinal: Positive for heartburn.       Controlled   Musculoskeletal: Positive for back pain and myalgias.       OBJECTIVE:  VS: BP 100/60   Pulse 64   Ht 5\' 5"  (1.651 m)   Wt 143 lb 6 oz (65 kg)   SpO2 97%   BMI 23.86 kg/m    Wt Readings from Last 3 Encounters:  05/02/20 143 lb 6 oz (65 kg)  04/24/20 146 lb (66.2 kg)  04/13/20 141 lb (64 kg)     EXAM: General: Pt appears well and is in NAD  Neck: General: Supple without adenopathy. Thyroid: Thyroid size normal.  No goiter or nodules appreciated. No thyroid bruit.  Lungs: Clear with good BS bilat with no rales, rhonchi, or wheezes  Heart: Auscultation: RRR.  Abdomen: Normoactive bowel sounds, soft, nontender, without masses or organomegaly palpable  Extremities:  BL LE: No pretibial edema normal ROM and strength.  Skin: Hair: Texture and amount normal with gender appropriate distribution Skin Inspection: No rashes Skin Palpation: Skin temperature, texture, and thickness normal to palpation  Neuro: Cranial nerves: II - XII grossly intact Motor: Normal strength throughout DTRs: 2+ and symmetric in UE without delay in relaxation phase  Mental Status: Judgment, insight: Intact Orientation: Oriented to time, place, and person Memory: Intact for recent and remote events Mood and affect: No depression, anxiety, or agitation     DATA REVIEWED:   Results for EVADNA, DONAGHY (MRN [id number]) as of 05/04/2020 08:19  Ref. Range 03/20/2020 09:02  Sodium Latest Ref Range: 135 - 146 mmol/L 138  Potassium Latest Ref Range: 3.5 - 5.3 mmol/L 4.0  Chloride Latest Ref Range: 98 - 110 mmol/L 105  CO2 Latest Ref Range: 20 - 32 mmol/L 26  Glucose Latest Ref Range: 65 - 99 mg/dL 88  Mean Plasma Glucose Latest Units: (calc) 117  BUN Latest Ref Range: 7 - 25 mg/dL 13  Creatinine Latest Ref Range: 0.50 - 0.99 mg/dL 0.80  Calcium Latest  Ref Range: 8.6 - 10.4 mg/dL 9.5  BUN/Creatinine Ratio Latest Ref Range: 6 - 22 (calc) NOT APPLICABLE  Magnesium Latest Ref Range: 1.5 - 2.5 mg/dL 2.1  AG Ratio Latest Ref Range: 1.0 - 2.5 (calc) 1.6  AST Latest Ref Range: 10 - 35 U/L 16  ALT Latest Ref Range: 6 - 29 U/L 10  Total Protein Latest Ref Range: 6.1 - 8.1 g/dL 6.4  Total Bilirubin Latest Ref Range: 0.2 - 1.2 mg/dL 0.4  GFR, Est Non African American Latest Ref Range: > OR =  60 mL/min/1.28m2 76  GFR, Est African American Latest Ref Range: > OR = 60 mL/min/1.68m2 88   Results for ORAL, HALLGREN (MRN [id number]) as of 05/04/2020 08:19  Ref. Range 03/20/2020 09:02  Alkaline phosphatase (APISO) Latest Ref Range: 37 - 153 U/L 80  Vitamin D, 25-Hydroxy Latest Ref Range: 30 - 100 ng/mL 83  Results for KIMARIE, COOR (MRN [id number]) as of 05/04/2020 08:19  Ref. Range 03/20/2020 09:02  TSH Latest Ref Range: 0.40 - 4.50 mIU/L 1.58     08/05/2017 02/14/2020 Change from prio  L- P spine -3.6 -3.3 Up 3.0%  RFN  -2.1   Right Total  -1.5 -1.9 Down 9.0%  LFN  -2.1   Left total  -2.0 -1.9 Up 1.0%      ASSESSMENT/PLAN/RECOMMENDATIONS:   1. Osteoporosis :  -We discussed the importance of optimizing calcium and vitamin D intake.  Her current vitamin D level is on the upper limit of normal, we will reduce the dose as below -Patient is an able to take calcium tablets due to constipation, I have encouraged her to either try calcium citrate versus consuming 2-3 servings of calcium daily in her diet. -We discussed different antiresorptive medication classes, we discussed the benefits and the side effects, patient opted to try alendronate as below We discussed rare side effect of atypical fractures and osteonecrosis. Pt encouraged to have an updated dental exam -Today we have reviewed both her bone density results, we discussed that there is a 3% improvement in her spine in 2021 as well as 1% improvement in left total hip BMD, but there is a 9%  decrease in the right total hip BMD compared to 2019.  None of the changes have been significant  Medications : Decrease Vitamin D 5000 iu daily  Calcium 1200 mg daily  Or 3 servings of calcium daily  Start Fosamax ( Alendronate) 70 mg once     Follow-up in 6 months   Signed electronically by: Mack Guise, MD  [facility]  [facility] [address] [place], [place] [address] Phone: [phone] FAX: [phone]   CC: Unk Pinto, MD [address] [place] [address] Phone: [phone] Fax: [phone]   Return to Endocrinology clinic as below: Future Appointments  Date Time Provider [place]  06/25/2020  9:30 AM Liane Comber, NP [facility] None  08/21/2020  9:00 AM Neldon Mc, Donnamarie Poag, MD [facility] None  10/02/2020  9:30 AM Unk Pinto, MD [facility] None  04/03/2021 10:00 AM Unk Pinto, MD [facility] None  04/23/2021  8:00 AM Joseph Pierini, MD [facility] [name]

## 2020-05-03 NOTE — Therapy (Signed)
[place], [place], [address] Phone: [phone]   Fax:  [phone]  Physical Therapy Treatment/Discharge  Patient Details  Name: [name] MRN: [id number] Date of Birth: 1951-05-27 Referring Provider (PT): Vicie Mutters, [place]   Encounter Date: 03/15/2020    Past Medical History:  Diagnosis Date  . Arthritis    cervical and lumbar  . Depression   . GERD (gastroesophageal reflux disease)   . Hyperlipidemia   . IBS (irritable bowel syndrome)   . Osteoporosis   . Prediabetes   . Vitamin D deficiency     Past Surgical History:  Procedure Laterality Date  . CARDIAC CATHETERIZATION  2011   normal (Dr. Terrence Dupont)  . RADIOACTIVE SEED GUIDED EXCISIONAL BREAST BIOPSY Right 03/11/2016   Procedure: RIGHT RADIOACTIVE SEED GUIDED EXCISIONAL BREAST BIOPSY;  Surgeon: Rolm Bookbinder, MD;  Location: [facility];  Service: General;  Laterality: Right;    There were no vitals filed for this visit.                                   PT Long Term Goals - 03/13/20 0942      PT LONG TERM GOAL #1   Title Independent with HEP    Baseline met for initial HEP, will continue to update prn    Status Achieved      PT LONG TERM GOAL #2   Title Increase left grip strength at least 10 lbs. and increase left shoulder abd/ER and wrist strength at least 1/1 MMT grade to improve ability for gripping and lifting activities for chores    Baseline Lt grip 53 lbs, shoulder    Status Achieved      PT LONG TERM GOAL #3   Title Increase left shoulder IR reach behind back to T10 or higher to improve ability for dressing and bathing    Baseline T 6, min pain    Status Achieved      PT LONG TERM GOAL #4   Title Increase right cervical rotation AROM at least 5-10 deg to improve ability to turn head while driving    Status Achieved      PT LONG TERM GOAL #5   Title Decrease  neck/thoracic and left UE pain symptoms at least 60% or greater from baseline status to improve positional tolerance and ability for grooming/getting hair done, IADLs    Baseline Pain is intermittent, moderate, 50% better    Status On-going                  Patient will benefit from skilled therapeutic intervention in order to improve the following deficits and impairments:  Impaired UE functional use, Increased fascial restricitons, Decreased strength, Decreased range of motion, Impaired sensation  Visit Diagnosis: Cervicalgia  Pain in thoracic spine     Problem List Patient Active Problem List   Diagnosis Date Noted  . Environmental allergies 04/24/2020  . Fear of side effects of medication 08/28/2019  . Upper airway cough syndrome 05/04/2019  . DOE (dyspnea on exertion) 05/03/2019  . 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 Osteoporosis 08/11/2017  . Abnormal glucose 06/25/2017  . Medication management 12/12/2014  . 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 GERD (gastroesophageal reflux disease)   . Vitamin D deficiency   . Hyperlipidemia, mixed          PHYSICAL THERAPY DISCHARGE SUMMARY  Visits from Start of Care: 8  Current functional level related to goals / functional outcomes: Patient did not return for further therapy after last session 03/15/20-no further therapy visits scheduled/planned at this time.   Remaining deficits: Status unknown   Education / Equipment: HEP Plan: Patient agrees to discharge.  Patient goals were partially met. Patient is being discharged due to not returning since the last visit.  ?????           Beaulah Dinning, PT, DPT 05/03/20 9:47 AM      [facility] [address] [place], [place], [address] Phone: [phone]   Fax:  [phone]  Name: [name] MRN: [id number] Date of Birth: 1951/03/21

## 2020-05-05 ENCOUNTER — Other Ambulatory Visit: Payer: Self-pay | Admitting: Internal Medicine

## 2020-05-05 DIAGNOSIS — E782 Mixed hyperlipidemia: Secondary | ICD-10-CM

## 2020-05-21 ENCOUNTER — Other Ambulatory Visit: Payer: Self-pay

## 2020-05-21 DIAGNOSIS — Z1212 Encounter for screening for malignant neoplasm of rectum: Secondary | ICD-10-CM

## 2020-05-21 DIAGNOSIS — Z1211 Encounter for screening for malignant neoplasm of colon: Secondary | ICD-10-CM

## 2020-05-21 LAB — POC HEMOCCULT BLD/STL (HOME/3-CARD/SCREEN)
Card #2 Fecal Occult Blod, POC: NEGATIVE
Card #3 Fecal Occult Blood, POC: NEGATIVE
Fecal Occult Blood, POC: NEGATIVE

## 2020-05-22 DIAGNOSIS — Z1212 Encounter for screening for malignant neoplasm of rectum: Secondary | ICD-10-CM | POA: Diagnosis not present

## 2020-05-22 DIAGNOSIS — Z1211 Encounter for screening for malignant neoplasm of colon: Secondary | ICD-10-CM | POA: Diagnosis not present

## 2020-06-01 ENCOUNTER — Telehealth: Payer: Self-pay | Admitting: Internal Medicine

## 2020-06-01 NOTE — Telephone Encounter (Signed)
OK 

## 2020-06-01 NOTE — Telephone Encounter (Signed)
Patient initially wanted to see Dr Cruzita Lederer but she was not taking new patients then - patient requests transfer of care from [facility] to [facility]. Please advise.

## 2020-06-01 NOTE — Telephone Encounter (Signed)
Ok with me 

## 2020-06-04 ENCOUNTER — Ambulatory Visit: Payer: Medicare Other | Admitting: Adult Health Nurse Practitioner

## 2020-06-04 ENCOUNTER — Encounter: Payer: Self-pay | Admitting: Adult Health Nurse Practitioner

## 2020-06-04 ENCOUNTER — Other Ambulatory Visit: Payer: Self-pay

## 2020-06-04 VITALS — BP 110/60 | HR 64 | Temp 97.6°F | Wt 141.6 lb

## 2020-06-04 DIAGNOSIS — R519 Headache, unspecified: Secondary | ICD-10-CM | POA: Diagnosis not present

## 2020-06-04 DIAGNOSIS — R5381 Other malaise: Secondary | ICD-10-CM

## 2020-06-04 DIAGNOSIS — R42 Dizziness and giddiness: Secondary | ICD-10-CM | POA: Diagnosis not present

## 2020-06-04 DIAGNOSIS — R0989 Other specified symptoms and signs involving the circulatory and respiratory systems: Secondary | ICD-10-CM

## 2020-06-04 DIAGNOSIS — R5383 Other fatigue: Secondary | ICD-10-CM

## 2020-06-04 NOTE — Telephone Encounter (Signed)
LMTCB to schedule a transfer of care appointment with Dr Cruzita Lederer (from [facility])

## 2020-06-04 NOTE — Patient Instructions (Signed)
We will contact you in 1-3 days with your lab results via [place].   Try ibuprofen 600mg , with food, at onset of headache to see if this helps.  You can also try Excedrin migraine to see if you get any relive from this.    Change out your Claritin (loratadine) for Zyrtec(Cetirazine) nightly.  Zyrtec can help to dry out any extra fluid that can cause dizziness.  This can make you sleepy so take this at night.   Continue to drink 60-80 ounces of water a day.  We will place an order for CT of the head to see if there are any acute abnormalities from  Your fall back in the summer.   Try the exercise below twice a day to see if these help with any of your symptoms of dizziness.    Benign Paroxysmal Positional Vertigo (BPPV)  General Information In Benign Paroxysmal Positional Vertigo (BPPV) dizziness is generally thought to be due to debris which has collected within a part of the inner ear. This debris can be thought of as "ear rocks", although the formal name is "otoconia". Ear rocks are small crystals of calcium carbonate derived from a structure in the ear called the "utricle" (figure to the right ). The symptoms of BPPV include dizziness or vertigo, lightheadedness, imbalance, and nausea. Activities which bring on symptoms will vary among persons, but symptoms are usually followed by a change of position of the head like getting out of bed or rolling over in bed are common "problem" motions .  However if you have worsening HA, changes vision/speech, weakness go to the ER     What can be done? 1) Use two or more pillows at night. Avoid sleeping on the "bad" side. In the morning, get up slowly and sit on the edge of the bed for a minute. 2) Medication prescribed by your doctor.  3) The exercises below, you can do at home to help you prevent the sensation later in the day.  4) We can refer you to physical therapy at [facility], # [place] treatments: The  Brandt-Daroff Exercises are a home method of treating BPPV,and are effective 95% of the time.  These exercises are performed in three sets per day for two weeks. In each set, one performs the maneuver as shown five times. Start sitting upright (position 1). Then move into the side-lying position (position 2), with the head angled upward about halfway. An easy way to remember this is to imagine someone standing about 6 feet in front of you, and just keep looking at their head at all times. Stay in the side-lying position for 30 seconds, or until the dizziness subsides if this is longer, then go back to the sitting position (position 3). Stay there for 30 seconds, and then go to the opposite side (position 4) and follow the same routine.  At home Epley Maneuver This procedure seems to be even more effective than the in-office procedure, perhaps because it is repeated every night for a week.  The method (for the left side) is performed as shown on the figure below.  1) One stays in each of the supine (lying down) positions for 30 seconds, and in the sitting upright position (top) for 1 minute.  2) Thus, once cycle takes 2 1/2 minutes.  3) Typically 3 cycles are performed just prior to going to sleep.  4) It is best to do them at night rather than in the morning  or midday, as if one becomes dizzy following the exercises, then it can resolve while one is sleeping.  The mirror image of this procedure is used for the right ear.

## 2020-06-04 NOTE — Progress Notes (Signed)
Acute Visit   Assessment and Plan: [name] was seen today for headache, dizziness and nausea.  Diagnoses and all orders for this visit:  Dizziness Malaise and fatigue -     CBC with Differential/Platelet -     COMPLETE METABOLIC PANEL WITH GFR -     TSH -     Iron,Total/Total Iron Binding Cap -     Urinalysis w microscopic + reflex cultur -     EKG 12-Lead -     CT Head Wo Contrast; Future  Labile hypertension Continue current medications: Controlled today Monitor blood pressure at home; call if consistently over 130/80 Continue DASH diet.   Reminder to go to the ER if any CP, SOB, nausea, dizziness, severe HA, changes vision/speech, left arm numbness and tingling and jaw pain. -     TSH -     Iron,Total/Total Iron Binding Cap  Worsening headaches -     CT Head Wo Contrast; Future  Other orders -     REFLEXIVE URINE CULTURE   Discussed differential and potential for BPPV.  Provided education and instructions for Eply maneuver for this.  Discussed hospital precautions.  Contact office with any new or worsening symptoms.   Further disposition pending results of labs. Discussed med's effects and SE's.   Over 30 minutes of face to face interview, exam, counseling, chart review, and critical decision making was performed.   Future Appointments  Date Time Provider [place]  06/25/2020  9:30 AM Liane Comber, NP [facility] None  07/11/2020  8:20 AM Philemon Kingdom, MD [facility] None  08/21/2020  9:00 AM Neldon Mc, Donnamarie Poag, MD [facility] None  10/02/2020  9:30 AM Unk Pinto, MD [facility] None  04/03/2021 10:00 AM Unk Pinto, MD [facility] None  04/23/2021  8:00 AM Joseph Pierini, MD [facility]    ------------------------------------------------------------------------------------------------------------------   HPI 69 y.o.female presents for evaluation of dizziness that started in late July 2021.  Reports that she fell and she hit her head on the bottom  of the sofa and she felt a squish in the back of the right side of her neck.  She reports sh was sitting on the sofa reading and fell forward.  She did not get evaluated at that time and did not seem to have extreme pain or headache. She reports since then she has had headaches off & on.  She typically has been able to control her headache with tylenol but reports now they break through the tylenol after a few hours.  She reports the headache is localized to the top of her head on the left and left temple at times.  She reports the headaches are also intermittent in nature and last 1-2 hours and at times lingers. She is having one headache a day. Also reports e also reports after this fall she has had a gradual onset of dizziness.  She reports it is intermittent and random with onset. Typically this occurs with the headaches no alone. Pain is 1-2 that is constant.  She is able to function but she knows it is there. She reports an episode of loss of balance after being reclined at the hair salon to get her hair washed.  She reports it was one week ago.  Her neck was sore and she let them know she could not sustain that position for much longer.  When she sat up she was dizzy and when she walked her balance was off. She has had nausea that is intermittent but infrequent and  not associated with dizziness but seem to be associated with the headache.    She reports that she has some shortness of breath but it is intermittent and not associated with activity or rest.  She also reports some heart palpitations  That is intermittent in nature once a week or less.   Reports in Sepetmeber there was a gas leak.  It eneded up coming from the fireplace and also an outside leak.  Those have since been repaired.  She reports that she drinks 60oz of water or more a day.  She eats two meals a day and a snack.   She is followed by Dr Earlean Shawl for IBS-C & GERD and well controlled on prilosec and famotidine.   She also  follows with Dr Kelton Pillar for Osteoporosis (Dx 2019).  Taking Fosamax 70mg  weekly. And next OV in 6 months.   Past Medical History:  Diagnosis Date  . Arthritis    cervical and lumbar  . Depression   . GERD (gastroesophageal reflux disease)   . Hyperlipidemia   . IBS (irritable bowel syndrome)   . Osteoporosis   . Prediabetes   . Vitamin D deficiency      Allergies  Allergen Reactions  . Bactrim [Sulfamethoxazole-Trimethoprim] Other (See Comments)    Patient preference to take medication without sulfa  . Keflex [Cephalexin]     Rash; chest pain     Current Outpatient Medications on File Prior to Visit  Medication Sig  . alendronate (FOSAMAX) 70 MG tablet Take 1 tablet (70 mg total) by mouth every 7 (seven) days. Take with a full glass of water on an empty stomach.  Marland Kitchen aspirin 81 MG tablet Take 81 mg by mouth daily.  . Cholecalciferol 125 MCG (5000 UT) capsule Take 5,000 Units by mouth daily.  . cyclobenzaprine (FLEXERIL) 10 MG tablet Take 1 tablet (10 mg total) by mouth 2 (two) times daily as needed for muscle spasms.  . cyproheptadine (PERIACTIN) 4 MG tablet 1/2-1 tablet at bedtime  . ezetimibe (ZETIA) 10 MG tablet TAKE 1 TABLET BY MOUTH DAILY FOR CHOLESTEROL  . famotidine (PEPCID) 40 MG tablet Take 1 tablet by mouth once daily in the evening  . loratadine (CLARITIN) 10 MG tablet Take 1 tablet 1-2 times daily  . Lutein 20 MG TABS Take by mouth.  . Multiple Vitamins-Minerals (AIRBORNE) CHEW Chew 1 tablet by mouth as needed.  . Multiple Vitamins-Minerals (WOMENS MULTI VITAMIN & MINERAL PO) Take 1 tablet by mouth daily.  Marland Kitchen omeprazole (PRILOSEC) 40 MG capsule Take 1 capsule by mouth twice daily  . OVER THE COUNTER MEDICATION Patient takes MOM as needed.  . predniSONE (DELTASONE) 20 MG tablet 2 tablets daily for 3 days, 1 tablet daily for 4 days.  . RESTASIS 0.05 % ophthalmic emulsion instill 1 drop into both eyes twice a day  . rosuvastatin (CRESTOR) 5 MG tablet Take     1  tablet     Daily      for Cholesterol  . Triamcinolone Acetonide (NASACORT AQ NA) Place into the nose.  . hyoscyamine (LEVBID) 0.375 MG 12 hr tablet Take by mouth.   No current facility-administered medications on file prior to visit.    ROS: all negative except above.   Physical Exam:  BP 110/60   Pulse 64   Temp 97.6 F (36.4 C)   Wt 141 lb 9.6 oz (64.2 kg)   SpO2 99%   BMI 23.56 kg/m   General Appearance: Well nourished, in no apparent  distress. Eyes: PERRLA, EOMs, conjunctiva no swelling or erythema Sinuses: No Frontal/maxillary tenderness ENT/Mouth: Ext aud canals clear, TMs without erythema, bulging. No erythema, swelling, or exudate on post pharynx.  Tonsils not swollen or erythematous. Hearing normal.  Neck: Supple, thyroid normal.  Respiratory: Respiratory effort normal, BS equal bilaterally without rales, rhonchi, wheezing or stridor.  Cardio: RRR with no MRGs. Brisk peripheral pulses without edema.  Abdomen: Soft, + BS.  Non tender, no guarding, rebound, hernias, masses. Lymphatics: Non tender without lymphadenopathy.  Musculoskeletal: Full ROM, 5/5 strength, normal gait.  Skin: Warm, dry without rashes, lesions, ecchymosis.  Neuro: Cranial nerves intact. Normal muscle tone, no cerebellar symptoms. Sensation intact.  Positive Marye Round in office. Psych: Awake and oriented X 3, normal affect, Insight and Judgment appropriate.      Garnet Sierras, Laqueta Jean, DNP [facility] 06/04/2020  3:41 PM

## 2020-06-05 LAB — CBC WITH DIFFERENTIAL/PLATELET
Absolute Monocytes: 285 cells/uL (ref 200–950)
Basophils Absolute: 31 cells/uL (ref 0–200)
Basophils Relative: 1 %
Eosinophils Absolute: 71 cells/uL (ref 15–500)
Eosinophils Relative: 2.3 %
HCT: 38.9 % (ref 35.0–45.0)
Hemoglobin: 12.8 g/dL (ref 11.7–15.5)
Lymphs Abs: 1510 cells/uL (ref 850–3900)
MCH: 28.6 pg (ref 27.0–33.0)
MCHC: 32.9 g/dL (ref 32.0–36.0)
MCV: 86.8 fL (ref 80.0–100.0)
MPV: 12.2 fL (ref 7.5–12.5)
Monocytes Relative: 9.2 %
Neutro Abs: 1203 cells/uL — ABNORMAL LOW (ref 1500–7800)
Neutrophils Relative %: 38.8 %
Platelets: 176 10*3/uL (ref 140–400)
RBC: 4.48 10*6/uL (ref 3.80–5.10)
RDW: 12.8 % (ref 11.0–15.0)
Total Lymphocyte: 48.7 %
WBC: 3.1 10*3/uL — ABNORMAL LOW (ref 3.8–10.8)

## 2020-06-05 LAB — COMPLETE METABOLIC PANEL WITH GFR
AG Ratio: 1.4 (calc) (ref 1.0–2.5)
ALT: 15 U/L (ref 6–29)
AST: 20 U/L (ref 10–35)
Albumin: 4.2 g/dL (ref 3.6–5.1)
Alkaline phosphatase (APISO): 89 U/L (ref 37–153)
BUN: 14 mg/dL (ref 7–25)
CO2: 27 mmol/L (ref 20–32)
Calcium: 9.9 mg/dL (ref 8.6–10.4)
Chloride: 104 mmol/L (ref 98–110)
Creat: 0.77 mg/dL (ref 0.50–0.99)
GFR, Est African American: 92 mL/min/{1.73_m2} (ref >=60)
GFR, Est Non African American: 79 mL/min/{1.73_m2} (ref >=60)
Globulin: 2.9 g/dL (calc) (ref 1.9–3.7)
Glucose, Bld: 88 mg/dL (ref 65–99)
Potassium: 4.5 mmol/L (ref 3.5–5.3)
Sodium: 139 mmol/L (ref 135–146)
Total Bilirubin: 0.3 mg/dL (ref 0.2–1.2)
Total Protein: 7.1 g/dL (ref 6.1–8.1)

## 2020-06-05 LAB — URINALYSIS W MICROSCOPIC + REFLEX CULTURE
Bacteria, UA: NONE SEEN /HPF
Bilirubin Urine: NEGATIVE
Glucose, UA: NEGATIVE
Hgb urine dipstick: NEGATIVE
Hyaline Cast: NONE SEEN /LPF
Ketones, ur: NEGATIVE
Leukocyte Esterase: NEGATIVE
Nitrites, Initial: NEGATIVE
Protein, ur: NEGATIVE
RBC / HPF: NONE SEEN /HPF (ref 0–2)
Specific Gravity, Urine: 1.004 (ref 1.001–1.03)
Squamous Epithelial / HPF: NONE SEEN /HPF (ref <=5)
WBC, UA: NONE SEEN /HPF (ref 0–5)
pH: 6.5 (ref 5.0–8.0)

## 2020-06-05 LAB — IRON, TOTAL/TOTAL IRON BINDING CAP
%SAT: 33 % (calc) (ref 16–45)
Iron: 105 ug/dL (ref 45–160)
TIBC: 317 mcg/dL (calc) (ref 250–450)

## 2020-06-05 LAB — TSH: TSH: 1.3 mIU/L (ref 0.40–4.50)

## 2020-06-05 LAB — NO CULTURE INDICATED

## 2020-06-22 LAB — HM DIABETES EYE EXAM

## 2020-06-22 NOTE — Progress Notes (Deleted)
FOLLOW UP  Assessment and Plan:   Hypertension Well controlled off of medications Monitor blood pressure at home; patient to call if consistently greater than 130/80 Continue DASH diet.   Reminder to go to the ER if any CP, SOB, nausea, dizziness, severe HA, changes vision/speech, left arm numbness and tingling and jaw pain.  Cholesterol Currently mildly above goal; continue zetia, omega 3  LDL goal <100 Continue low cholesterol diet and exercise.  Check lipid panel.   Other abnormal glucose (prediabetes) Continue diet and exercise.  Perform daily foot/skin check, notify office of any concerning changes.  Check A1C q13m; check serum glucose, monitor weight trends  BMi 23 Continue to recommend diet heavy in fruits and veggies and low in animal meats, cheeses, and dairy products, appropriate calorie intake Discuss exercise recommendations routinely, decrease splurge snacking (cheetos, cheeze-its) Continue to monitor weight at each visit  Vitamin D Def At goal at last visit; continue supplementation to maintain goal of 60-100 Defer Vit D level  GERD  Continue PPI per Dr. Melvyn Novas Discussed diet, avoiding triggers and other lifestyle changes  Dyspnea on exertion Unremarkable cardiology and pulmonology evaluation; not with exertion, no accompaniments Reports occasional chest tightness only when in kitchen *** Pending evaluation by allergist  Upper airway cough syndrome  Improved with PPI/H2i; Dr. Melvyn Novas follows ***  Continue diet and meds as discussed. Further disposition pending results of labs. Discussed med's effects and SE's.   Over 30 minutes of exam, counseling, chart review, and critical decision making was performed.   Future Appointments  Date Time Provider [place]  06/25/2020  9:30 AM Liane Comber, NP [facility] None  06/28/2020 10:20 AM GI-315 CT 1 GI-315CT [facility] [facility]  07/04/2020  8:15 AM Bernarda Caffey, MD [facility] None  07/11/2020  8:20 AM Philemon Kingdom, MD [facility] None  08/21/2020  9:00 AM Neldon Mc, Donnamarie Poag, MD [facility] None  10/02/2020  9:30 AM Unk Pinto, MD [facility] None  04/03/2021 10:00 AM Unk Pinto, MD [facility] None  04/23/2021  8:00 AM Joseph Pierini, MD [facility] None    ----------------------------------------------------------------------------------------------------------------------  HPI 70 y.o. female  presents for 3 month follow up on hypertension, cholesterol, prediabetes, weight and vitamin D deficiency.   She has been referred to cardiology and pulmonology due to reports of exertional dyspnea with unremarkable workup.  She has been referred by Dr. Melvyn Novas to Dr. Neldon Mc citing upper airway cough syndrome and recommended omeprazole 40 mg, famotidine ***  She does have constipation, but improved with align, taking citrucel, miralax, working on increasing water intake. Worse with butter or fried foods.   BMI is There is no height or weight on file to calculate BMI., she has been working on diet and exercise, is walking 3 days a week for 30 min at a time. Trying to increase.  Wt Readings from Last 3 Encounters:  06/04/20 141 lb 9.6 oz (64.2 kg)  05/02/20 143 lb 6 oz (65 kg)  04/24/20 146 lb (66.2 kg)   EKGs have demonstrated frequent PVCs/bigeminy Saw Dr. [place] and had normal ECHO in 03/2018-  Normal LV function; EF 55 to 60%. Normal diastolic parameters. Mild MR and TR Had normal cath in 2011  Her blood pressure has been controlled at home, today their BP is    She does workout. She denies chest pain, dizziness. She reports in the past week she has not had shortness of breath; she denies notable dyspnea with exertion outdoors, has had some episodes where she feels she is work  to breathe when working in the kitchen.    She is on cholesterol medication Zetia and omega 3 and denies myalgias. Her cholesterol is not at goal. The cholesterol last visit was:   Lab Results  Component Value Date   CHOL  195 03/20/2020   HDL 69 03/20/2020   LDLCALC 111 (H) 03/20/2020   TRIG 60 03/20/2020   CHOLHDL 2.8 03/20/2020    She has been working on diet and exercise for prediabetes, and denies increased appetite, nausea, paresthesia of the feet, polydipsia, polyuria and visual disturbances. Last A1C in the office was:  Lab Results  Component Value Date   HGBA1C 5.7 (H) 03/20/2020   Last GFR:  Lab Results  Component Value Date   GFRAA 92 06/04/2020   Patient is on Vitamin D supplement, taking 10000 MWF, 5000 IU other days.   Lab Results  Component Value Date   VD25OH 83 03/20/2020        Current Medications:  Current Outpatient Medications on File Prior to Visit  Medication Sig  . alendronate (FOSAMAX) 70 MG tablet Take 1 tablet (70 mg total) by mouth every 7 (seven) days. Take with a full glass of water on an empty stomach.  Marland Kitchen aspirin 81 MG tablet Take 81 mg by mouth daily.  . Cholecalciferol 125 MCG (5000 UT) capsule Take 5,000 Units by mouth daily.  . cyclobenzaprine (FLEXERIL) 10 MG tablet Take 1 tablet (10 mg total) by mouth 2 (two) times daily as needed for muscle spasms.  . cyproheptadine (PERIACTIN) 4 MG tablet 1/2-1 tablet at bedtime  . ezetimibe (ZETIA) 10 MG tablet TAKE 1 TABLET BY MOUTH DAILY FOR CHOLESTEROL  . famotidine (PEPCID) 40 MG tablet Take 1 tablet by mouth once daily in the evening  . hyoscyamine (LEVBID) 0.375 MG 12 hr tablet Take by mouth.  . loratadine (CLARITIN) 10 MG tablet Take 1 tablet 1-2 times daily  . Lutein 20 MG TABS Take by mouth.  . Multiple Vitamins-Minerals (AIRBORNE) CHEW Chew 1 tablet by mouth as needed.  . Multiple Vitamins-Minerals (WOMENS MULTI VITAMIN & MINERAL PO) Take 1 tablet by mouth daily.  Marland Kitchen omeprazole (PRILOSEC) 40 MG capsule Take 1 capsule by mouth twice daily  . OVER THE COUNTER MEDICATION Patient takes MOM as needed.  . predniSONE (DELTASONE) 20 MG tablet 2 tablets daily for 3 days, 1 tablet daily for 4 days.  . RESTASIS 0.05 %  ophthalmic emulsion instill 1 drop into both eyes twice a day  . rosuvastatin (CRESTOR) 5 MG tablet Take     1 tablet     Daily      for Cholesterol  . Triamcinolone Acetonide (NASACORT AQ NA) Place into the nose.   No current facility-administered medications on file prior to visit.     Allergies:  Allergies  Allergen Reactions  . Bactrim [Sulfamethoxazole-Trimethoprim] Other (See Comments)    Patient preference to take medication without sulfa  . Keflex [Cephalexin]     Rash; chest pain     Medical History:  Past Medical History:  Diagnosis Date  . Arthritis    cervical and lumbar  . Depression   . GERD (gastroesophageal reflux disease)   . Hyperlipidemia   . IBS (irritable bowel syndrome)   . Osteoporosis   . Prediabetes   . Vitamin D deficiency    Family history- Reviewed and unchanged Social history- Reviewed and unchanged  ***  Review of Systems:  Review of Systems  Constitutional: Negative for malaise/fatigue and weight loss.  HENT: Negative for hearing loss and tinnitus.   Eyes: Negative for blurred vision and double vision.  Respiratory: Negative for cough (occasional, improved with PPI), shortness of breath and wheezing.   Cardiovascular: Negative for chest pain, palpitations, orthopnea, claudication and leg swelling.  Gastrointestinal: Positive for constipation. Negative for abdominal pain, blood in stool, diarrhea, heartburn, melena, nausea and vomiting.  Genitourinary: Negative.   Musculoskeletal: Positive for joint pain (bil shoulders intermittently). Negative for myalgias.  Skin: Negative for rash.  Neurological: Negative for dizziness, tingling, sensory change, weakness and headaches.  Endo/Heme/Allergies: Negative for polydipsia.  Psychiatric/Behavioral: Negative.   All other systems reviewed and are negative.   Physical Exam: There were no vitals taken for this visit. Wt Readings from Last 3 Encounters:  06/04/20 141 lb 9.6 oz (64.2 kg)   05/02/20 143 lb 6 oz (65 kg)  04/24/20 146 lb (66.2 kg)   General Appearance: Well nourished, in no apparent distress. Eyes: PERRLA, EOMs, conjunctiva no swelling or erythema Sinuses: No Frontal/maxillary tenderness ENT/Mouth: Ext aud canals clear, TMs without erythema, bulging. No erythema, swelling, or exudate on post pharynx.  Tonsils not swollen or erythematous. Hearing normal.  Neck: Supple, thyroid normal.  Respiratory: Respiratory effort normal, BS equal bilaterally without rales, rhonchi, wheezing or stridor.  Cardio: RRR with no MRGs. Brisk peripheral pulses without edema.  Abdomen: Soft, + BS.  Non tender, no guarding, rebound, hernias, masses. Lymphatics: Non tender without lymphadenopathy.  Musculoskeletal: Full ROM, 5/5 strength, Normal gait Bilateral normal active ROM, no tenderness, no impingement sign  Strength is normal and symmetric in arms. Sensation intact bilaterally. Radial pulses are symmetrical. No spinal tenderness, full cervical ROM intact.  Skin: Warm, dry without rashes, lesions, ecchymosis.  Neuro: Cranial nerves intact. No cerebellar symptoms.  Psych: Awake and oriented X 3, normal affect, Insight and Judgment appropriate.    Izora Ribas, NP 12:59 PM [facility]

## 2020-06-25 ENCOUNTER — Ambulatory Visit: Payer: Medicare Other | Admitting: Adult Health

## 2020-06-25 DIAGNOSIS — Z6823 Body mass index (BMI) 23.0-23.9, adult: Secondary | ICD-10-CM

## 2020-06-25 DIAGNOSIS — E782 Mixed hyperlipidemia: Secondary | ICD-10-CM

## 2020-06-25 DIAGNOSIS — R7309 Other abnormal glucose: Secondary | ICD-10-CM

## 2020-06-25 DIAGNOSIS — Z79899 Other long term (current) drug therapy: Secondary | ICD-10-CM

## 2020-06-25 DIAGNOSIS — E559 Vitamin D deficiency, unspecified: Secondary | ICD-10-CM

## 2020-06-25 DIAGNOSIS — R0989 Other specified symptoms and signs involving the circulatory and respiratory systems: Secondary | ICD-10-CM

## 2020-06-25 DIAGNOSIS — K219 Gastro-esophageal reflux disease without esophagitis: Secondary | ICD-10-CM

## 2020-06-28 ENCOUNTER — Ambulatory Visit
Admission: RE | Admit: 2020-06-28 | Discharge: 2020-06-28 | Disposition: A | Discharge status: Home / Self Care | Payer: Medicare Other | Source: Ambulatory Visit | Attending: Adult Health Nurse Practitioner | Admitting: Adult Health Nurse Practitioner

## 2020-06-28 ENCOUNTER — Other Ambulatory Visit: Payer: Self-pay

## 2020-06-28 DIAGNOSIS — R519 Headache, unspecified: Secondary | ICD-10-CM

## 2020-06-28 DIAGNOSIS — R42 Dizziness and giddiness: Secondary | ICD-10-CM

## 2020-07-03 NOTE — Progress Notes (Shared)
[facility] Note  07/04/2020     CHIEF COMPLAINT Patient presents for No chief complaint on file.   HISTORY OF PRESENT ILLNESS: [name] is a 70 y.o. female who presents to the clinic today for:     Referring physician: Unk Pinto, MD [place],  [place] [address]  HISTORICAL INFORMATION:   Selected notes from the MEDICAL RECORD NUMBER Referred by Dr. Truman Hayward:  Ocular Hx- PMH-    CURRENT MEDICATIONS: Current Outpatient Medications (Ophthalmic Drugs)  Medication Sig  . RESTASIS 0.05 % ophthalmic emulsion instill 1 drop into both eyes twice a day   No current facility-administered medications for this visit. (Ophthalmic Drugs)   Current Outpatient Medications (Other)  Medication Sig  . alendronate (FOSAMAX) 70 MG tablet Take 1 tablet (70 mg total) by mouth every 7 (seven) days. Take with a full glass of water on an empty stomach.  Marland Kitchen aspirin 81 MG tablet Take 81 mg by mouth daily.  . Cholecalciferol 125 MCG (5000 UT) capsule Take 5,000 Units by mouth daily.  . cyclobenzaprine (FLEXERIL) 10 MG tablet Take 1 tablet (10 mg total) by mouth 2 (two) times daily as needed for muscle spasms.  . cyproheptadine (PERIACTIN) 4 MG tablet 1/2-1 tablet at bedtime  . ezetimibe (ZETIA) 10 MG tablet TAKE 1 TABLET BY MOUTH DAILY FOR CHOLESTEROL  . famotidine (PEPCID) 40 MG tablet Take 1 tablet by mouth once daily in the evening  . hyoscyamine (LEVBID) 0.375 MG 12 hr tablet Take by mouth.  . loratadine (CLARITIN) 10 MG tablet Take 1 tablet 1-2 times daily  . Lutein 20 MG TABS Take by mouth.  . Multiple Vitamins-Minerals (AIRBORNE) CHEW Chew 1 tablet by mouth as needed.  . Multiple Vitamins-Minerals (WOMENS MULTI VITAMIN & MINERAL PO) Take 1 tablet by mouth daily.  Marland Kitchen omeprazole (PRILOSEC) 40 MG capsule Take 1 capsule by mouth twice daily  . OVER THE COUNTER MEDICATION Patient takes MOM as needed.  . predniSONE (DELTASONE) 20 MG  tablet 2 tablets daily for 3 days, 1 tablet daily for 4 days.  . rosuvastatin (CRESTOR) 5 MG tablet Take     1 tablet     Daily      for Cholesterol  . Triamcinolone Acetonide (NASACORT AQ NA) Place into the nose.   No current facility-administered medications for this visit. (Other)      REVIEW OF SYSTEMS:    ALLERGIES Allergies  Allergen Reactions  . Bactrim [Sulfamethoxazole-Trimethoprim] Other (See Comments)    Patient preference to take medication without sulfa  . Keflex [Cephalexin]     Rash; chest pain    PAST MEDICAL HISTORY Past Medical History:  Diagnosis Date  . Arthritis    cervical and lumbar  . Depression   . GERD (gastroesophageal reflux disease)   . Hyperlipidemia   . IBS (irritable bowel syndrome)   . Osteoporosis   . Prediabetes   . Vitamin D deficiency    Past Surgical History:  Procedure Laterality Date  . CARDIAC CATHETERIZATION  2011   normal (Dr. Terrence Dupont)  . RADIOACTIVE SEED GUIDED EXCISIONAL BREAST BIOPSY Right 03/11/2016   Procedure: RIGHT RADIOACTIVE SEED GUIDED EXCISIONAL BREAST BIOPSY;  Surgeon: Rolm Bookbinder, MD;  Location: [facility];  Service: General;  Laterality: Right;    FAMILY HISTORY Family History  Problem Relation Age of Onset  . Hypertension Mother   . Other Mother        surgeries involving  upper back  . Diabetes Father   . Heart failure Father   . Cancer Maternal Grandmother        Lung  . Allergic rhinitis Neg Hx   . Angioedema Neg Hx   . Asthma Neg Hx   . Atopy Neg Hx   . Eczema Neg Hx   . Immunodeficiency Neg Hx   . Urticaria Neg Hx     SOCIAL HISTORY Social History   Tobacco Use  . Smoking status: Never Smoker  . Smokeless tobacco: Never Used  Vaping Use  . Vaping Use: Never used  Substance Use Topics  . Alcohol use: Not Currently  . Drug use: No         OPHTHALMIC EXAM: Not recorded     IMAGING AND PROCEDURES  Imaging and Procedures for 07/04/2020            ASSESSMENT/PLAN:  No diagnosis found.  1.  2.  3.  Ophthalmic Meds Ordered this visit:  No orders of the defined types were placed in this encounter.      No follow-ups on file.  There are no Patient Instructions on file for this visit.   Explained the diagnoses, plan, and follow up with the patient and they expressed understanding.  Patient expressed understanding of the importance of proper follow up care.    This document serves as a record of services personally performed by Gardiner Sleeper, MD, PhD. It was created on their behalf by Roselee Nova, COMT. The creation of this record is the provider's dictation and/or activities during the visit.  Electronically signed by: Roselee Nova, COMT 07/03/20 9:38 AM     Gardiner Sleeper, M.D., Ph.D. Diseases & Surgery of the Retina and Vitreous [facility] @TODAY @     Abbreviations: M myopia (nearsighted); A astigmatism; H hyperopia (farsighted); P presbyopia; Mrx spectacle prescription;  CTL contact lenses; OD right eye; OS left eye; OU both eyes  XT exotropia; ET esotropia; PEK punctate epithelial keratitis; PEE punctate epithelial erosions; DES dry eye syndrome; MGD meibomian gland dysfunction; ATs artificial tears; PFAT's preservative free artificial tears; [place] nuclear sclerotic cataract; PSC posterior subcapsular cataract; ERM epi-retinal membrane; PVD posterior vitreous detachment; RD retinal detachment; DM diabetes mellitus; DR diabetic retinopathy; NPDR non-proliferative diabetic retinopathy; PDR proliferative diabetic retinopathy; CSME clinically significant macular edema; DME diabetic macular edema; dbh dot blot hemorrhages; CWS cotton wool spot; POAG primary open angle glaucoma; C/D cup-to-disc ratio; HVF humphrey visual field; GVF goldmann visual field; OCT optical coherence tomography; IOP intraocular pressure; BRVO Branch retinal vein occlusion; CRVO central retinal vein occlusion; CRAO central  retinal artery occlusion; BRAO branch retinal artery occlusion; RT retinal tear; SB scleral buckle; PPV pars plana vitrectomy; VH Vitreous hemorrhage; PRP panretinal laser photocoagulation; IVK intravitreal kenalog; VMT vitreomacular traction; MH Macular hole;  NVD neovascularization of the disc; NVE neovascularization elsewhere; AREDS age related eye disease study; ARMD age related macular degeneration; POAG primary open angle glaucoma; EBMD epithelial/anterior basement membrane dystrophy; ACIOL anterior chamber intraocular lens; IOL intraocular lens; PCIOL posterior chamber intraocular lens; Phaco/IOL phacoemulsification with intraocular lens placement; [place] photorefractive keratectomy; LASIK laser assisted in situ keratomileusis; HTN hypertension; DM diabetes mellitus; COPD chronic obstructive pulmonary disease

## 2020-07-04 ENCOUNTER — Ambulatory Visit (INDEPENDENT_AMBULATORY_CARE_PROVIDER_SITE_OTHER): Payer: Medicare Other | Admitting: Ophthalmology

## 2020-07-04 ENCOUNTER — Other Ambulatory Visit: Payer: Self-pay

## 2020-07-04 ENCOUNTER — Encounter (INDEPENDENT_AMBULATORY_CARE_PROVIDER_SITE_OTHER): Payer: Self-pay

## 2020-07-04 ENCOUNTER — Encounter (INDEPENDENT_AMBULATORY_CARE_PROVIDER_SITE_OTHER): Payer: Medicare Other | Admitting: Ophthalmology

## 2020-07-04 DIAGNOSIS — H43813 Vitreous degeneration, bilateral: Secondary | ICD-10-CM | POA: Diagnosis not present

## 2020-07-04 DIAGNOSIS — H25813 Combined forms of age-related cataract, bilateral: Secondary | ICD-10-CM | POA: Diagnosis not present

## 2020-07-04 DIAGNOSIS — H3581 Retinal edema: Secondary | ICD-10-CM

## 2020-07-04 NOTE — Progress Notes (Signed)
[facility] Note  07/04/2020     CHIEF COMPLAINT Patient presents for Retina Evaluation   HISTORY OF PRESENT ILLNESS: [name] is a 70 y.o. female who presents to the clinic today for:   HPI    Retina Evaluation    In both eyes.  Duration of 6 months.  Associated Symptoms Flashes, Floaters and Pain.  I, the attending physician,  performed the HPI with the patient and updated documentation appropriately.          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  Patient has a hx floaters.  Around the summer she started having FOLs that have not went away.  Sees them during the day but mostly in the evening.  They are temporally on both sides.  She has also had a headache over her eyes at the eyebrows and around the eyes.   Taking Restasis BID OU- DES OU, gl suspect OU, cataracts OU.       Last edited by Bernarda Caffey, MD on 07/05/2020 12:57 PM. (History)    pt is here on the referral of Dr. Kathlen Mody for PVD OU, pt has been seen a few times by Dr. Kathlen Mody since last fall, but is having persistent fol, pt is also having pain around her eye sockets, she states she tries to "ride it out", but does occasionally use ibuprofen which helps, pt has never had sx on her eyes, she denies eye trauma or hx of eye problems other than wearing glasses, she uses Restasis BID OU, pt is pre-diabetic, but does not take any medication for it, she denies having high blood pressure, she states the flashes she is seeing are mostly in the dark, she states they look like a camera flash  Referring physician: Hortencia Pilar, MD [place],  [place] [address]  HISTORICAL INFORMATION:   Selected notes from the MEDICAL RECORD NUMBER Referred by Dr. Quentin Ore for PVD OU LEE:  Ocular Hx- PMH-    CURRENT MEDICATIONS: Current Outpatient Medications (Ophthalmic Drugs)  Medication Sig  . RESTASIS 0.05 % ophthalmic emulsion instill 1 drop into both eyes  twice a day   No current facility-administered medications for this visit. (Ophthalmic Drugs)   Current Outpatient Medications (Other)  Medication Sig  . aspirin 81 MG tablet Take 81 mg by mouth daily.  . Cholecalciferol 125 MCG (5000 UT) capsule Take 5,000 Units by mouth daily.  . cyclobenzaprine (FLEXERIL) 10 MG tablet Take 1 tablet (10 mg total) by mouth 2 (two) times daily as needed for muscle spasms.  . cyproheptadine (PERIACTIN) 4 MG tablet 1/2-1 tablet at bedtime  . ezetimibe (ZETIA) 10 MG tablet TAKE 1 TABLET BY MOUTH DAILY FOR CHOLESTEROL  . famotidine (PEPCID) 40 MG tablet Take 1 tablet by mouth once daily in the evening  . loratadine (CLARITIN) 10 MG tablet Take 1 tablet 1-2 times daily  . Lutein 20 MG TABS Take by mouth.  . Multiple Vitamins-Minerals (AIRBORNE) CHEW Chew 1 tablet by mouth as needed.  . Multiple Vitamins-Minerals (WOMENS MULTI VITAMIN & MINERAL PO) Take 1 tablet by mouth daily.  Marland Kitchen omeprazole (PRILOSEC) 40 MG capsule Take 1 capsule by mouth twice daily  . Triamcinolone Acetonide (NASACORT AQ NA) Place into the nose.  Marland Kitchen alendronate (FOSAMAX) 70 MG tablet Take 1 tablet (70 mg total) by mouth every 7 (seven) days. Take with a full glass of water on an empty stomach. (  Patient not taking: Reported on 07/04/2020)  . hyoscyamine (LEVBID) 0.375 MG 12 hr tablet Take by mouth.  Marland Kitchen OVER THE COUNTER MEDICATION Patient takes MOM as needed.  . predniSONE (DELTASONE) 20 MG tablet 2 tablets daily for 3 days, 1 tablet daily for 4 days. (Patient not taking: Reported on 07/04/2020)  . rosuvastatin (CRESTOR) 5 MG tablet Take     1 tablet     Daily      for Cholesterol (Patient not taking: Reported on 07/04/2020)   No current facility-administered medications for this visit. (Other)      REVIEW OF SYSTEMS: ROS    Positive for: Gastrointestinal, Musculoskeletal, Eyes, Psychiatric   Negative for: Constitutional, Neurological, Skin, Genitourinary, HENT, Endocrine, Cardiovascular,  Respiratory, Allergic/Imm, Heme/Lymph   Last edited by Leonie Douglas, COA on 07/04/2020  9:28 AM. (History)       ALLERGIES Allergies  Allergen Reactions  . Bactrim [Sulfamethoxazole-Trimethoprim] Other (See Comments)    Patient preference to take medication without sulfa  . Keflex [Cephalexin]     Rash; chest pain    PAST MEDICAL HISTORY Past Medical History:  Diagnosis Date  . Arthritis    cervical and lumbar  . Depression   . GERD (gastroesophageal reflux disease)   . Hyperlipidemia   . IBS (irritable bowel syndrome)   . Osteoporosis   . Prediabetes   . Vitamin D deficiency    Past Surgical History:  Procedure Laterality Date  . CARDIAC CATHETERIZATION  2011   normal (Dr. Terrence Dupont)  . RADIOACTIVE SEED GUIDED EXCISIONAL BREAST BIOPSY Right 03/11/2016   Procedure: RIGHT RADIOACTIVE SEED GUIDED EXCISIONAL BREAST BIOPSY;  Surgeon: Rolm Bookbinder, MD;  Location: [facility];  Service: General;  Laterality: Right;    FAMILY HISTORY Family History  Problem Relation Age of Onset  . Hypertension Mother   . Other Mother        surgeries involving upper back  . Diabetes Father   . Heart failure Father   . Cancer Maternal Grandmother        Lung  . Allergic rhinitis Neg Hx   . Angioedema Neg Hx   . Asthma Neg Hx   . Atopy Neg Hx   . Eczema Neg Hx   . Immunodeficiency Neg Hx   . Urticaria Neg Hx     SOCIAL HISTORY Social History   Tobacco Use  . Smoking status: Never Smoker  . Smokeless tobacco: Never Used  Vaping Use  . Vaping Use: Never used  Substance Use Topics  . Alcohol use: Not Currently  . Drug use: No         OPHTHALMIC EXAM:  Base Eye Exam    Visual Acuity (Snellen - Linear)      Right Left   Dist cc 20/20 20/20       Tonometry (Tonopen, 9:40 AM)      Right Left   Pressure 12 10       Pupils      Dark Light Shape React APD   Right 3 2 Round Brisk None   Left 3 2 Round Brisk None       Visual Fields (Counting  fingers)      Left Right    Full Full       Extraocular Movement      Right Left    Full Full       Neuro/Psych    Oriented x3: Yes   Mood/Affect: Normal       Dilation  Both eyes: 1.0% Mydriacyl, 2.5% Phenylephrine @ 9:40 AM        Slit Lamp and Fundus Exam    Slit Lamp Exam      Right Left   Lids/Lashes Dermatochalasis - upper lid Dermatochalasis - upper lid   Conjunctiva/Sclera White and quiet White and quiet   Cornea Trace arcus Trace arcus   Anterior Chamber Deep and quiet Deep and quiet   Iris Round and dilated Round and dilated   Lens 2+ Nuclear sclerosis, 2+ Cortical cataract 2+ Nuclear sclerosis, 2+ Cortical cataract   Vitreous Vitreous syneresis, Posterior vitreous detachment Vitreous syneresis, Posterior vitreous detachment, vitreous condensations       Fundus Exam      Right Left   Disc Pink and Sharp, focal temporal PPA Pink and Sharp, focal temporal PPA, Compact   C/D Ratio 0.3 0.4   Macula Flat, Good foveal reflex, No heme or edema Flat, Good foveal reflex, No heme or edema   Vessels mild attenuation mild attenuation   Periphery Attached, No RT/RD, mild White without pressure Attached, No RT/RD        Refraction    Wearing Rx      Sphere Cylinder Axis Add   Right +1.25 +0.75 143 +2.50   Left +1.25 +0.50 040 +2.50          IMAGING AND PROCEDURES  Imaging and Procedures for 07/04/2020  OCT, Retina - OU - Both Eyes       Right Eye Quality was good. Central Foveal Thickness: 273. Progression has no prior data. Findings include normal foveal contour, no IRF, no SRF (Trace vitreous opacities).   Left Eye Quality was good. Central Foveal Thickness: 277. Progression has no prior data. Findings include normal foveal contour, no IRF, no SRF.   Notes *Images captured and stored on drive  Diagnosis / Impression:  NFP, no IRF/SRF OU Trace vitreous opacities OU  Clinical management:  See below  Abbreviations: NFP - Normal foveal profile.  CME - cystoid macular edema. PED - pigment epithelial detachment. IRF - intraretinal fluid. SRF - subretinal fluid. EZ - ellipsoid zone. ERM - epiretinal membrane. ORA - outer retinal atrophy. ORT - outer retinal tubulation. SRHM - subretinal hyper-reflective material. IRHM - intraretinal hyper-reflective material                 ASSESSMENT/PLAN:    ICD-10-CM   1. Posterior vitreous detachment of both eyes  H43.813   2. Retinal edema  H35.81 OCT, Retina - OU - Both Eyes  3. Combined forms of age-related cataract of both eyes  H25.813     1,2. PVD / vitreous syneresis OU  - onset in fall/winter 2021, since then has had mild intermittent persistent photopsias OU -- noted in low light / dark environments mostly assodciated with eye movement  - Discussed findings and prognosis  - No RT or RD on 360 scleral depressed exam  - Reviewed s/s of RT/RD  - Strict return precautions for any such RT/RD signs/symptoms  - f/u in 6-8 wks -- DFE/OCT  3. Mixed Cataract OU  - The symptoms of cataract, surgical options, and treatments and risks were discussed with patient. - discussed diagnosis and progression - under the expert management of Dr. Kathlen Mody   Ophthalmic Meds Ordered this visit:  No orders of the defined types were placed in this encounter.      Return for f/u 6-8 weeks, PVD OU, DFE, OCT.  There are no Patient Instructions on file for  this visit.   Explained the diagnoses, plan, and follow up with the patient and they expressed understanding.  Patient expressed understanding of the importance of proper follow up care.   This document serves as a record of services personally performed by Gardiner Sleeper, MD, PhD. It was created on their behalf by San Jetty. Owens Shark, OA an ophthalmic technician. The creation of this record is the provider's dictation and/or activities during the visit.    Electronically signed by: San Jetty. Owens Shark, [place] 01.19.2022 1:01 PM   Gardiner Sleeper, M.D.,  Ph.D. Diseases & Surgery of the Retina and Vitreous [facility]  I have reviewed the above documentation for accuracy and completeness, and I agree with the above. Gardiner Sleeper, M.D., Ph.D. 07/05/20 1:01 PM  Abbreviations: M myopia (nearsighted); A astigmatism; H hyperopia (farsighted); P presbyopia; Mrx spectacle prescription;  CTL contact lenses; OD right eye; OS left eye; OU both eyes  XT exotropia; ET esotropia; PEK punctate epithelial keratitis; PEE punctate epithelial erosions; DES dry eye syndrome; MGD meibomian gland dysfunction; ATs artificial tears; PFAT's preservative free artificial tears; [place] nuclear sclerotic cataract; PSC posterior subcapsular cataract; ERM epi-retinal membrane; PVD posterior vitreous detachment; RD retinal detachment; DM diabetes mellitus; DR diabetic retinopathy; NPDR non-proliferative diabetic retinopathy; PDR proliferative diabetic retinopathy; CSME clinically significant macular edema; DME diabetic macular edema; dbh dot blot hemorrhages; CWS cotton wool spot; POAG primary open angle glaucoma; C/D cup-to-disc ratio; HVF humphrey visual field; GVF goldmann visual field; OCT optical coherence tomography; IOP intraocular pressure; BRVO Branch retinal vein occlusion; CRVO central retinal vein occlusion; CRAO central retinal artery occlusion; BRAO branch retinal artery occlusion; RT retinal tear; SB scleral buckle; PPV pars plana vitrectomy; VH Vitreous hemorrhage; PRP panretinal laser photocoagulation; IVK intravitreal kenalog; VMT vitreomacular traction; MH Macular hole;  NVD neovascularization of the disc; NVE neovascularization elsewhere; AREDS age related eye disease study; ARMD age related macular degeneration; POAG primary open angle glaucoma; EBMD epithelial/anterior basement membrane dystrophy; ACIOL anterior chamber intraocular lens; IOL intraocular lens; PCIOL posterior chamber intraocular lens; Phaco/IOL phacoemulsification with intraocular  lens placement; [place] photorefractive keratectomy; LASIK laser assisted in situ keratomileusis; HTN hypertension; DM diabetes mellitus; COPD chronic obstructive pulmonary disease

## 2020-07-05 ENCOUNTER — Encounter (INDEPENDENT_AMBULATORY_CARE_PROVIDER_SITE_OTHER): Payer: Self-pay | Admitting: Ophthalmology

## 2020-07-06 NOTE — Progress Notes (Signed)
FOLLOW UP  Assessment and Plan:   Hypertension Well controlled off of medications Monitor blood pressure at home; patient to call if consistently greater than 130/80 Continue DASH diet.   Reminder to go to the ER if any CP, SOB, nausea, dizziness, severe HA, changes vision/speech, left arm numbness and tingling and jaw pain.  Cholesterol Currently mildly above goal; continue zetia Preference to avoid adding statin; she has low risk personal and family history LDL goal <100 Continue low cholesterol diet and exercise; discussed low saturated/cholesterol and increase soluble fiber; diet discussed and consider supplement Check lipid panel.   Other abnormal glucose (prediabetes) Continue diet and exercise.  Perform daily foot/skin check, notify office of any concerning changes.  Check A1C q4m; check serum glucose, monitor weight trends  BMi 23 Continue to recommend diet heavy in fruits and veggies and low in animal meats, cheeses, and dairy products, appropriate calorie intake Discuss exercise recommendations routinely, decrease splurge snacking (cheetos, cheeze-its) Continue to monitor weight at each visit  Vitamin D Def At goal at last visit; continue supplementation to maintain goal of 60-100 Defer Vit D level  GERD/upper airway cough syndrome Continue PPI per Dr. Melvyn Novas Discussed diet, avoiding triggers and other lifestyle changes  LUQ abd pain Mild, intermittent; positional with supine or on left side;  ? GERD related, gas; she is very concerned about pancreas though my suspicion is fairly low; will check lipase after discussion Avoid lying down within 2 hours of eating, continue GERD interventions, may try simethicone product if persistent; try to identify triggers   Continue diet and meds as discussed. Further disposition pending results of labs. Discussed med's effects and SE's.   Over 30 minutes of exam, counseling, chart review, and critical decision making was  performed.   Future Appointments  Date Time Provider [place]  07/11/2020  8:20 AM Philemon Kingdom, MD [facility] None  08/21/2020  9:00 AM Neldon Mc, Donnamarie Poag, MD [facility] None  08/28/2020  8:30 AM Bernarda Caffey, MD [facility] None  10/02/2020  9:30 AM Unk Pinto, MD [facility] None  04/03/2021 10:00 AM Unk Pinto, MD [facility] None  04/23/2021  8:00 AM Joseph Pierini, MD [facility] None    ----------------------------------------------------------------------------------------------------------------------  HPI 70 y.o. female  presents for 3 month follow up on hypertension, cholesterol, prediabetes, weight and vitamin D deficiency.   She has been referred to cardiology and pulmonology due to reports of exertional dyspnea with unremarkable workup in 2019/2020. She has been referred by Dr. Melvyn Novas to Dr. Neldon Mc citing upper airway cough syndrome and recommended omeprazole 40 mg in AM, famotidine 40 mg in PM, has noted improvement with this. Also has HOB elevated and using a wedge pillow. Has had some intermittent LUQ pain, typically at night after lying down; has had some soft stools, denies diarrhea or clay colored, but she is concerned about pancreas.   She does have constipation, but improved with align, miralax, working on increasing water intake.   She has been on alendronate for osteoporosis but had some concerns with this med; she is scheduled to see Dr. Cruzita Lederer this month to discuss recommendations. She is on multivitamin with calcium, on vit D supplement. Discussed vitamin K2. She is doing daily elliptical with resistance.   BMI is Body mass index is 23.8 kg/m., she has been working on diet and exercise, is walking/doing elliptical 3-4 days 30 min at a time. Trying to increase.  Wt Readings from Last 3 Encounters:  07/09/20 143 lb (64.9 kg)  06/04/20 141 lb 9.6  oz (64.2 kg)  05/02/20 143 lb 6 oz (65 kg)   EKGs have demonstrated frequent PVCs/bigeminy Saw Dr. [place] and  had normal ECHO in 03/2018-  Normal LV function; EF 55 to 60%. Normal diastolic parameters. Mild MR and TR. Had normal cath in 2011  Her blood pressure has been controlled at home, today their BP is BP: 98/60  She does workout. She denies chest pain, dyspnea, dizziness.    She is on cholesterol medication Zetia 10 mg and denies myalgias. Her cholesterol is not at goal. She was prescribed rosuvastatin 5 mg but doesn't want to start if possible. Denies family hx of MI/CVA. The cholesterol last visit was:   Lab Results  Component Value Date   CHOL 195 03/20/2020   HDL 69 03/20/2020   LDLCALC 111 (H) 03/20/2020   TRIG 60 03/20/2020   CHOLHDL 2.8 03/20/2020    She has been working on diet and exercise for prediabetes, and denies increased appetite, nausea, paresthesia of the feet, polydipsia, polyuria and visual disturbances. Last A1C in the office was:  Lab Results  Component Value Date   HGBA1C 5.7 (H) 03/20/2020   Last GFR:  Lab Results  Component Value Date   GFRAA 92 06/04/2020   Patient is on Vitamin D supplement, taking 10000 MWF, 5000 IU other days.   Lab Results  Component Value Date   VD25OH 83 03/20/2020        Current Medications:  Current Outpatient Medications on File Prior to Visit  Medication Sig  . aspirin 81 MG tablet Take 81 mg by mouth daily.  . Cholecalciferol 125 MCG (5000 UT) capsule Take 5,000 Units by mouth daily.  . cyclobenzaprine (FLEXERIL) 10 MG tablet Take 1 tablet (10 mg total) by mouth 2 (two) times daily as needed for muscle spasms.  Marland Kitchen ezetimibe (ZETIA) 10 MG tablet TAKE 1 TABLET BY MOUTH DAILY FOR CHOLESTEROL  . famotidine (PEPCID) 40 MG tablet Take 1 tablet by mouth once daily in the evening  . loratadine (CLARITIN) 10 MG tablet Take 1 tablet 1-2 times daily  . Lutein 20 MG TABS Take by mouth.  . Multiple Vitamins-Minerals (AIRBORNE) CHEW Chew 1 tablet by mouth as needed.  . Multiple Vitamins-Minerals (WOMENS MULTI VITAMIN & MINERAL PO) Take  1 tablet by mouth daily.  Marland Kitchen omeprazole (PRILOSEC) 40 MG capsule Take 1 capsule by mouth twice daily (Patient taking differently: Take 40 mg by mouth daily.)  . OVER THE COUNTER MEDICATION Patient takes MOM as needed.  . RESTASIS 0.05 % ophthalmic emulsion instill 1 drop into both eyes twice a day  . Triamcinolone Acetonide (NASACORT AQ NA) Place into the nose.  Marland Kitchen alendronate (FOSAMAX) 70 MG tablet Take 1 tablet (70 mg total) by mouth every 7 (seven) days. Take with a full glass of water on an empty stomach. (Patient not taking: No sig reported)  . cyproheptadine (PERIACTIN) 4 MG tablet 1/2-1 tablet at bedtime (Patient not taking: Reported on 07/09/2020)  . hyoscyamine (LEVBID) 0.375 MG 12 hr tablet Take by mouth. (Patient not taking: Reported on 07/09/2020)  . rosuvastatin (CRESTOR) 5 MG tablet Take     1 tablet     Daily      for Cholesterol (Patient not taking: No sig reported)   No current facility-administered medications on file prior to visit.     Allergies:  Allergies  Allergen Reactions  . Bactrim [Sulfamethoxazole-Trimethoprim] Other (See Comments)    Patient preference to take medication without sulfa  . Keflex [  Cephalexin]     Rash; chest pain     Medical History:  Past Medical History:  Diagnosis Date  . Arthritis    cervical and lumbar  . Depression   . GERD (gastroesophageal reflux disease)   . Hyperlipidemia   . IBS (irritable bowel syndrome)   . Osteoporosis   . Prediabetes   . Vitamin D deficiency    Family history- Reviewed and unchanged Social history- Reviewed and unchanged   Review of Systems:  Review of Systems  Constitutional: Negative for malaise/fatigue and weight loss.  HENT: Negative for hearing loss and tinnitus.   Eyes: Negative for blurred vision and double vision.  Respiratory: Negative for cough (occasional, improved with PPI), shortness of breath and wheezing.   Cardiovascular: Negative for chest pain, palpitations, orthopnea, claudication  and leg swelling.  Gastrointestinal: Positive for abdominal pain (mild intermittent, LUQ ). Negative for blood in stool, constipation (improved), diarrhea, heartburn, melena, nausea and vomiting.  Genitourinary: Negative.   Musculoskeletal: Negative for joint pain and myalgias.  Skin: Negative for rash.  Neurological: Positive for headaches (end of the day after reading too long). Negative for dizziness, tingling, sensory change and weakness.  Endo/Heme/Allergies: Negative for polydipsia.  Psychiatric/Behavioral: Negative.   All other systems reviewed and are negative.   Physical Exam: BP 98/60   Pulse (!) 59   Temp (!) 96.8 F (36 C)   Wt 143 lb (64.9 kg)   SpO2 99%   BMI 23.80 kg/m  Wt Readings from Last 3 Encounters:  07/09/20 143 lb (64.9 kg)  06/04/20 141 lb 9.6 oz (64.2 kg)  05/02/20 143 lb 6 oz (65 kg)   General Appearance: Well nourished, in no apparent distress. Eyes: PERRLA, EOMs, conjunctiva no swelling or erythema Sinuses: No Frontal/maxillary tenderness ENT/Mouth: Ext aud canals clear, TMs without erythema, bulging. No erythema, swelling, or exudate on post pharynx.  Tonsils not swollen or erythematous. Hearing normal.  Neck: Supple, thyroid normal.  Respiratory: Respiratory effort normal, BS equal bilaterally without rales, rhonchi, wheezing or stridor.  Cardio: RRR with no MRGs. Brisk peripheral pulses without edema.  Abdomen: Soft, + BS.  Non tender, no guarding, rebound, hernias, masses. Lymphatics: Non tender without lymphadenopathy.  Musculoskeletal: Full ROM, 5/5 strength, Normal gait Skin: Warm, dry without rashes, lesions, ecchymosis.  Neuro: Cranial nerves intact. No cerebellar symptoms.  Psych: Awake and oriented X 3, normal affect, Insight and Judgment appropriate.    Jodi Ribas, NP 11:54 AM [name]

## 2020-07-09 ENCOUNTER — Ambulatory Visit: Payer: Medicare Other | Admitting: Adult Health

## 2020-07-09 ENCOUNTER — Encounter: Payer: Self-pay | Admitting: Adult Health

## 2020-07-09 ENCOUNTER — Other Ambulatory Visit: Payer: Self-pay

## 2020-07-09 ENCOUNTER — Other Ambulatory Visit: Payer: Self-pay | Admitting: Internal Medicine

## 2020-07-09 VITALS — BP 98/60 | HR 59 | Temp 96.8°F | Wt 143.0 lb

## 2020-07-09 DIAGNOSIS — R7309 Other abnormal glucose: Secondary | ICD-10-CM

## 2020-07-09 DIAGNOSIS — E782 Mixed hyperlipidemia: Secondary | ICD-10-CM

## 2020-07-09 DIAGNOSIS — E559 Vitamin D deficiency, unspecified: Secondary | ICD-10-CM | POA: Diagnosis not present

## 2020-07-09 DIAGNOSIS — Z79899 Other long term (current) drug therapy: Secondary | ICD-10-CM

## 2020-07-09 DIAGNOSIS — R0989 Other specified symptoms and signs involving the circulatory and respiratory systems: Secondary | ICD-10-CM

## 2020-07-09 DIAGNOSIS — R1012 Left upper quadrant pain: Secondary | ICD-10-CM

## 2020-07-09 DIAGNOSIS — Z6823 Body mass index (BMI) 23.0-23.9, adult: Secondary | ICD-10-CM

## 2020-07-09 MED ORDER — EZETIMIBE 10 MG PO TABS: ORAL_TABLET | ORAL | 3 refills | Status: DC

## 2020-07-09 MED ORDER — EZETIMIBE 10 MG PO TABS: ORAL_TABLET | ORAL | 0 refills | Status: DC

## 2020-07-09 MED ORDER — OMEPRAZOLE 40 MG PO CPDR: 40.0000 mg | DELAYED_RELEASE_CAPSULE | Freq: Every day | ORAL | 1 refills | Status: DC

## 2020-07-09 NOTE — Patient Instructions (Addendum)
Stress or tension headache - walk way when eyes start bothering you   Try gentle massagn and neck stretches  Try applying heat to neck and shoulders to relax muscles    For cholesterol - limit/avoid saturated fat (trans fat and cholesterol) - mostly in dairy, animal products, processed   Increase soluble fiber intake - black beans (any beans), flax seeds, chia seeds, most beans/whole grains/nuts/seeds, also all fruits and veggies will have some soluble fiber  When in doubt - less animal products, less processed and more whole food plants   For bone density - high calcium diet, vitamin D supplement  High vitamin K2 (in green leafy veggies) - can get on a supplement if not getting enough daily   Daily resistance exercise for bone health - squats, pilates, tai chi, body weight exercises     Eating Plan for Osteoporosis Osteoporosis causes your bones to become weak and brittle. This puts you at greater risk for bone breaks (fractures) from small bumps or falls. Making changes to your diet and increasing your physical activity can help strengthen your bones and improve your overall health. Calcium and vitamin D are nutrients that play an important role in bone health. Vitamin D helps your body use calcium and strengthen bones. It is important to get enough calcium and vitamin D as part of your eating plan for osteoporosis. What are tips for following this plan? Reading food labels  Try to get at least 1,000 milligrams (mg) of calcium each day.  Look for foods that have at least 50 mg of calcium per serving.  Talk with your health care provider about taking a calcium supplement if you do not get enough calcium from food.  Do not have more than 2,500 mg of calcium each day. This is the upper limit for food and nutritional supplements combined. Too much calcium may cause constipation and prevent you from absorbing other important nutrients.  Choose foods that contain vitamin  D.  Take a daily vitamin supplement that contains 800-1,000 international units (IU) of vitamin D. The amount may be different depending on your age, body weight, and where you live. Talk with your dietitian or health care provider about how much vitamin D is right for you.  Avoid foods that have more than 300 mg of sodium per serving. Too much sodium can cause your body to lose calcium.  Talk with your dietitian or health care provider about how much sodium you are allowed each day. Shopping  Do not buy foods with added salt, including: ? Salted snacks. ? Angie Fava. ? Canned soups. ? Canned meats. ? Processed meats, such as bacon or precooked or cured meat like sausages or meat loaves. ? Smoked fish. Meal planning  Eat balanced meals that contain protein foods, fruits and vegetables, and foods rich in calcium and vitamin D.  Eat at least 5 servings of fruits and vegetables each day.  Eat 5-6 oz (142-170 g) of lean meat, poultry, fish, eggs, or beans each day. Lifestyle  Do not use any products that contain nicotine or tobacco, such as cigarettes, e-cigarettes, and chewing tobacco. If you need help quitting, ask your health care provider.  If your health care provider recommends that you lose weight: ? Work with a dietitian to develop an eating plan that will help you reach your desired weight goal. ? Exercise for at least 30 minutes a day, 5 or more days a week, or as told by your health care provider.  Work with a physical therapist to develop an exercise plan that includes flexibility, balance, and strength exercises. Do not focus only on aerobic exercise.  Do not drink alcohol if: ? Your health care provider tells you not to drink. ? You are pregnant, may be pregnant, or are planning to become pregnant.  If you drink alcohol: ? Limit how much you use to:  0-1 drink a day for women.  0-2 drinks a day for men. ? Be aware of how much alcohol is in your drink. In the [place]  one drink equals one 12 oz bottle of beer (355 mL), one 5 oz glass of wine (148 mL), or one 1 oz glass of hard liquor (44 mL). What foods should I eat? Foods high in calcium  Yogurt. Yogurt with fruit.  Milk. Evaporated skim milk. Dry milk powder.  Calcium-fortified orange juice.  Parmesan cheese. Part-skim ricotta cheese. Natural hard cheese. Cream cheese. Cottage cheese.  Canned sardines. Canned salmon.  Calcium-treated tofu. Calcium-fortified cereal bar. Calcium-fortified cereal. Calcium-fortified graham crackers.  Cooked collard greens. Turnip greens. Broccoli. Kale.  Almonds.  White beans.  Corn tortilla.   Foods high in vitamin D  Cod liver oil. Fatty fish, such as tuna, mackerel, and salmon.  Milk. Fortified soy milk. Fortified fruit juice.  Yogurt. Margarine.  Egg yolks. Foods high in protein  Beef. Lamb. Pork tenderloin.  Chicken breast.  Tuna (canned). Fish fillet.  Tofu.  Cooked soy beans. Soy patty. Beans (canned or cooked).  Cottage cheese.  Yogurt.  Peanut butter.  Pumpkin seeds. Nuts. Sunflower seeds.  Hard cheese.  Milk or other milk products, such as soy milk. The items listed above may not be a complete list of foods and beverages you can eat. Contact a dietitian for more options. Summary  Calcium and vitamin D are nutrients that play an important role in bone health and are an important part of your eating plan for osteoporosis.  Eat balanced meals that contain protein foods, fruits and vegetables, and foods rich in calcium and vitamin D.  Avoid foods that have more than 300 mg of sodium per serving. Too much sodium can cause your body to lose calcium.  Exercise is an important part of prevention and treatment of osteoporosis. Aim for at least 30 minutes a day, 5 days a week. This information is not intended to replace advice given to you by your health care provider. Make sure you discuss any questions you have with your health  care provider. Document Revised: 11/17/2019 Document Reviewed: 11/17/2019 Elsevier Patient Education  [place].

## 2020-07-10 LAB — COMPLETE METABOLIC PANEL WITH GFR
AG Ratio: 1.4 (calc) (ref 1.0–2.5)
ALT: 14 U/L (ref 6–29)
AST: 18 U/L (ref 10–35)
Albumin: 4.1 g/dL (ref 3.6–5.1)
Alkaline phosphatase (APISO): 79 U/L (ref 37–153)
BUN: 13 mg/dL (ref 7–25)
CO2: 30 mmol/L (ref 20–32)
Calcium: 9.8 mg/dL (ref 8.6–10.4)
Chloride: 104 mmol/L (ref 98–110)
Creat: 0.73 mg/dL (ref 0.50–0.99)
GFR, Est African American: 98 mL/min/{1.73_m2} (ref >=60)
GFR, Est Non African American: 85 mL/min/{1.73_m2} (ref >=60)
Globulin: 2.9 g/dL (calc) (ref 1.9–3.7)
Glucose, Bld: 86 mg/dL (ref 65–99)
Potassium: 4.5 mmol/L (ref 3.5–5.3)
Sodium: 140 mmol/L (ref 135–146)
Total Bilirubin: 0.4 mg/dL (ref 0.2–1.2)
Total Protein: 7 g/dL (ref 6.1–8.1)

## 2020-07-10 LAB — CBC WITH DIFFERENTIAL/PLATELET
Absolute Monocytes: 292 cells/uL (ref 200–950)
Basophils Absolute: 19 cells/uL (ref 0–200)
Basophils Relative: 0.7 %
Eosinophils Absolute: 41 cells/uL (ref 15–500)
Eosinophils Relative: 1.5 %
HCT: 38.6 % (ref 35.0–45.0)
Hemoglobin: 12.8 g/dL (ref 11.7–15.5)
Lymphs Abs: 1258 cells/uL (ref 850–3900)
MCH: 28.4 pg (ref 27.0–33.0)
MCHC: 33.2 g/dL (ref 32.0–36.0)
MCV: 85.8 fL (ref 80.0–100.0)
MPV: 11.6 fL (ref 7.5–12.5)
Monocytes Relative: 10.8 %
Neutro Abs: 1091 cells/uL — ABNORMAL LOW (ref 1500–7800)
Neutrophils Relative %: 40.4 %
Platelets: 184 10*3/uL (ref 140–400)
RBC: 4.5 10*6/uL (ref 3.80–5.10)
RDW: 12.8 % (ref 11.0–15.0)
Total Lymphocyte: 46.6 %
WBC: 2.7 10*3/uL — ABNORMAL LOW (ref 3.8–10.8)

## 2020-07-10 LAB — TSH: TSH: 1.39 mIU/L (ref 0.40–4.50)

## 2020-07-10 LAB — LIPID PANEL
Cholesterol: 193 mg/dL (ref <200)
HDL: 77 mg/dL (ref >=50)
LDL Cholesterol (Calc): 102 mg/dL (calc) — ABNORMAL HIGH
Non-HDL Cholesterol (Calc): 116 mg/dL (calc) (ref <130)
Total CHOL/HDL Ratio: 2.5 (calc) (ref <5.0)
Triglycerides: 58 mg/dL (ref <150)

## 2020-07-10 LAB — MAGNESIUM: Magnesium: 2.2 mg/dL (ref 1.5–2.5)

## 2020-07-10 LAB — LIPASE: Lipase: 29 U/L (ref 7–60)

## 2020-07-11 ENCOUNTER — Ambulatory Visit: Payer: Medicare Other | Admitting: Internal Medicine

## 2020-07-11 ENCOUNTER — Encounter: Payer: Self-pay | Admitting: Internal Medicine

## 2020-07-11 ENCOUNTER — Other Ambulatory Visit: Payer: Self-pay

## 2020-07-11 ENCOUNTER — Other Ambulatory Visit: Payer: Self-pay | Admitting: Internal Medicine

## 2020-07-11 VITALS — BP 120/78 | HR 74 | Ht 65.0 in | Wt 143.4 lb

## 2020-07-11 DIAGNOSIS — E782 Mixed hyperlipidemia: Secondary | ICD-10-CM

## 2020-07-11 DIAGNOSIS — M810 Age-related osteoporosis without current pathological fracture: Secondary | ICD-10-CM

## 2020-07-11 MED ORDER — ROSUVASTATIN CALCIUM 5 MG PO TABS: ORAL_TABLET | ORAL | 0 refills | Status: DC

## 2020-07-11 NOTE — Progress Notes (Unsigned)
Patient ID: [name], female   DOB: Jul 23, 1950, 70 y.o.   MRN: [id number]   This visit occurred during the SARS-CoV-2 public health emergency.  Safety protocols were in place, including screening questions prior to the visit, additional usage of staff PPE, and extensive cleaning of exam room while observing appropriate contact time as indicated for disinfecting solutions.   HPI  [name] is a 70 y.o.-year-old female, referred by her PCP, Dr. Melford Aase, for management of osteoporosis (OP).  She previously saw Dr. Kelton Pillar in our practice in 04/2020 but would like a second opinion.  Pt was dx with OP in 2019.  I reviewed pt's DXA scans: Date L1-L4 T score FN T score 33% distal Radius  02/14/2020 ([facility])  -3.3 RFN: -2.1 LFN: -2.1 n/a  08/05/2017 ([facility])  -3.6 RFN: -2.1 LFN: -2.2 n/a   She denies fractures.  No falls  No dizziness/orthostasis/poor vision. She had vertigo few mo ago >> improved.  Previous OP treatments:  - she was recommended Fosamax 70 mg weekly by Dr. 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 Reviewed available vit D levels: Lab Results  Component Value Date   VD25OH 83 03/20/2020   VD25OH 71 08/29/2019   VD25OH 59 02/17/2019   VD25OH 74 11/10/2018   VD25OH 58 08/10/2018   VD25OH 70 01/28/2018   VD25OH 56 01/15/2017   VD25OH 64 12/26/2015   VD25OH 61 12/12/2014   VD25OH 56 08/09/2014   Pt is on: - vitamin D 5000 units daily and also a multivitamin - will start a K2 vitamin The dose was decreased in 04/2020.  She is not taking calcium due to constipation.  She is not drinking milk due to hyperlipidemia. Drinks almond milk.  No weight bearing exercises. She is using an elliptical.  Also, walking 3-5 times weekly, 45 minutes at a time.  She does not take high vitamin A doses.  She got 3-4x Prednisone tapers in the past.  No  smoking.  Alcohol: Beer or wine occasionally.  Menopause was at 70 y/o.  No history of HRT.  No family history of osteoporosis.  No h/o hyper/hypocalcemia or hyperparathyroidism. No h/o kidney stones. Lab Results  Component Value Date   CALCIUM 9.8 07/09/2020   CALCIUM 9.9 06/04/2020   CALCIUM 9.5 03/20/2020   CALCIUM 9.6 01/30/2020   CALCIUM 9.3 01/27/2020   CALCIUM 9.1 12/30/2019   CALCIUM 9.8 11/21/2019   CALCIUM 9.6 08/29/2019   CALCIUM 9.7 05/26/2019   CALCIUM 9.3 02/17/2019   No h/o thyrotoxicosis. Reviewed TSH recent levels:  Lab Results  Component Value Date   TSH 1.39 07/09/2020   TSH 1.30 06/04/2020   TSH 1.58 03/20/2020   TSH 1.14 11/21/2019   TSH 1.34 08/29/2019   No h/o CKD. Last BUN/Cr: Lab Results  Component Value Date   BUN 13 07/09/2020   CREATININE 0.73 07/09/2020   She has a history of GERD and is on Omeprazole and Mylanta.  She also has a history of small hiatal hernia.  She is retired Licensed conveyancer.  ROS: Constitutional: no weight gain, no weight loss,+ increased appetite, + fatigue, + subjective hyperthermia, no subjective hypothermia, no nocturia Eyes: + Blurry vision, no xerophthalmia ENT: no sore throat, no nodules palpated in neck, no dysphagia, no odynophagia, + hoarseness, + tinnitus, no hypoacusis Cardiovascular: no CP, no SOB, no  palpitations, no leg swelling Respiratory: + Cough, no SOB, no wheezing Gastrointestinal: + N, no V, no D, + C, + acid reflux Musculoskeletal: no muscle, + joint aches Skin: no rash, + itching, + hair loss Neurological: no tremors, no numbness or tingling/no dizziness/+ HAs Psychiatric: no depression, no anxiety + Low libido  I reviewed pt's medications, allergies, PMH, social hx, family hx, and changes were documented in the history of present illness. Otherwise, unchanged from my initial visit note.  Past Medical History:  Diagnosis Date  . Arthritis    cervical and lumbar  . Depression   . GERD  (gastroesophageal reflux disease)   . Hyperlipidemia   . IBS (irritable bowel syndrome)   . Osteoporosis   . Prediabetes   . Vitamin D deficiency    Past Surgical History:  Procedure Laterality Date  . CARDIAC CATHETERIZATION  2011   normal (Dr. Terrence Dupont)  . RADIOACTIVE SEED GUIDED EXCISIONAL BREAST BIOPSY Right 03/11/2016   Procedure: RIGHT RADIOACTIVE SEED GUIDED EXCISIONAL BREAST BIOPSY;  Surgeon: Rolm Bookbinder, MD;  Location: [facility];  Service: General;  Laterality: Right;   Social History   Socioeconomic History  . Marital status: Married    Spouse name: Not on file  . Number of children: 0  . Years of education: Not on file  . Highest education level: Not on file  Occupational History  . Occupation: retired Copywriter, advertising  Tobacco Use  . Smoking status: Never Smoker  . Smokeless tobacco: Never Used  Vaping Use  . Vaping Use: Never used  Substance and Sexual Activity  . Alcohol use: Not Currently  . Drug use: No  . Sexual activity: Yes    Birth control/protection: Post-menopausal    Comment: 1st intercourse 55 yo-5 partners  Other Topics Concern  . Not on file  Social History Narrative   Lives at home with spouse   Right handed   Caffeine: not much, drinks decaf 2 cups/day   Social Determinants of Health   Financial Resource Strain: Not on file  Food Insecurity: Not on file  Transportation Needs: Not on file  Physical Activity: Not on file  Stress: Not on file  Social Connections: Not on file  Intimate Partner Violence: Not on file   Current Outpatient Medications on File Prior to Visit  Medication Sig Dispense Refill  . alendronate (FOSAMAX) 70 MG tablet Take 1 tablet (70 mg total) by mouth every 7 (seven) days. Take with a full glass of water on an empty stomach. (Patient not taking: No sig reported) 13 tablet 3  . aspirin 81 MG tablet Take 81 mg by mouth daily.    . Bacillus Coagulans-Inulin (ALIGN PREBIOTIC-PROBIOTIC PO)  Take 1 tablet by mouth daily.    . Cholecalciferol 125 MCG (5000 UT) capsule Take 5,000 Units by mouth daily.    . cyclobenzaprine (FLEXERIL) 10 MG tablet Take 1 tablet (10 mg total) by mouth 2 (two) times daily as needed for muscle spasms. 20 tablet 0  . ezetimibe (ZETIA) 10 MG tablet Take   1 tablet   Daily for Cholesterol 90 tablet 0  . famotidine (PEPCID) 40 MG tablet Take 1 tablet by mouth once daily in the evening 30 tablet 5  . hyoscyamine (LEVBID) 0.375 MG 12 hr tablet Take by mouth. (Patient not taking: Reported on 07/09/2020)    . loratadine (CLARITIN) 10 MG tablet Take 1 tablet 1-2 times daily 60 tablet 3  . Lutein 20 MG TABS Take by mouth.    Marland Kitchen  Multiple Vitamins-Minerals (AIRBORNE) CHEW Chew 1 tablet by mouth as needed.    . Multiple Vitamins-Minerals (WOMENS MULTI VITAMIN & MINERAL PO) Take 1 tablet by mouth daily.    Marland Kitchen omeprazole (PRILOSEC) 40 MG capsule Take 1 capsule (40 mg total) by mouth daily. 90 capsule 1  . OVER THE COUNTER MEDICATION Patient takes MOM as needed.    . RESTASIS 0.05 % ophthalmic emulsion instill 1 drop into both eyes twice a day  0  . Triamcinolone Acetonide (NASACORT AQ NA) Place into the nose.     No current facility-administered medications on file prior to visit.   Allergies  Allergen Reactions  . Bactrim [Sulfamethoxazole-Trimethoprim] Other (See Comments)    Patient preference to take medication without sulfa  . Keflex [Cephalexin]     Rash; chest pain   Family History  Problem Relation Age of Onset  . Hypertension Mother   . Other Mother        surgeries involving upper back  . Diabetes Father   . Heart failure Father   . Cancer Maternal Grandmother        Lung  . Allergic rhinitis Neg Hx   . Angioedema Neg Hx   . Asthma Neg Hx   . Atopy Neg Hx   . Eczema Neg Hx   . Immunodeficiency Neg Hx   . Urticaria Neg Hx    PE: BP 120/78   Pulse 74   Ht 5\' 5"  (1.651 m)   Wt 143 lb 6.4 oz (65 kg)   SpO2 98%   BMI 23.86 kg/m  Wt Readings  from Last 3 Encounters:  07/11/20 143 lb 6.4 oz (65 kg)  07/09/20 143 lb (64.9 kg)  06/04/20 141 lb 9.6 oz (64.2 kg)   Constitutional: Normal weight, in NAD. No kyphosis. Eyes: PERRLA, EOMI, no exophthalmos ENT: moist mucous membranes, no thyromegaly, no cervical lymphadenopathy Cardiovascular: RRR, No MRG Respiratory: CTA B Gastrointestinal: abdomen soft, NT, ND, BS+ Musculoskeletal: no deformities, strength intact in all 4 Skin: moist, warm, no rashes Neurological: no tremor with outstretched hands, DTR normal in all 4  Assessment: 1. Osteoporosis  Plan: 1. Osteoporosis - likely postmenopausal/age-related, no FH of OP - Discussed about increased risk of fracture, depending on the T score, greatly increased when the T score is lower than -2.5, but it is actually a continuum and -2.5 should not be regarded as an absolute threshold. We reviewed her last 2 DXA scan reports together, and I explained that based on the T scores, she has an increased risk for fractures. This is highest at the spine. However, the T-scores have improved at the level of the spine between 2019 and now, while they are stable at the hips - we reviewed her dietary and supplemental calcium and vitamin D intake. I recommended to make sure she gets 1200 mg of calcium daily preferentially from diet and I will check vit D today to see if her supplementation is adequate - discussed fall precautions - not doing weight bearing exercises - given handout from [facility]: weight bearing exercises - advised to do this every day or at least 5/7 days - I also recommended the [facility] - for skeletal loading. Given brochure and explained benefits. - we discussed about maintaining a good amount of protein in her diet. The recommended daily protein intake is ~0.8 g per kilogram per day (approximately 50 g/day for her). I advised her to try to aim for this amount, since a diet low  in proteins can  exacerbate osteoporosis. Also, avoid smoking or >2 drinks of alcohol a day. - I recommended the following book for the concept of low acid eating:  - We discussed about the different medication classes, benefits and side effects (including atypical fractures and ONJ - no dental workup in progress or planned). I did explain that her previous joint pain in TMJ was unlikely to be related to the 1 dose of oral bisphosphonate that she took in 04/2020 - I explained that, due to her GERD, first options are sq denosumab (Prolia) sq every 6 months for 6-10 years and zoledronic acid (Reclast) iv 1x a year for 1-2 years. I would use Teriparatide/Abaloparatide (which are daily subcu medication) or Romosozumab (monthly) as a last resort. I explained the mechanism of action and expected benefits. However, for now, she would like to continue to manage her osteoporosis without medications - Pt was given reading information about Reclast and Prolia and advised him to let me know if she changes her mind - Since she was interested in non-medication treatment for osteoporosis, I did suggest Osteostrong skeletal loading. - Will check the following labs today: Orders Placed This Encounter  Procedures  . VITAMIN D 25 Hydroxy (Vit-D Deficiency, Fractures)  - will check a new DXA scan in 1-2 years - will see pt back in a year  - Total time spent for the visit: 50 min, in obtaining medical information from the patient and from the chart, reviewing Dr. Dr. Quin Hoop note, her  previous labs, imaging evaluations, and treatments, reviewing her symptoms, counseling her about her condition (please see the discussed topics above), and developing a plan to further investigate and treat it; she had a number of questions which I addressed.  Component     Latest Ref Rng & Units 07/11/2020  Vitamin D, 25-Hydroxy     30.0 - 100.0 ng/mL 83.8  Normal vitamin D.  However, since she has constipation, I would like her to decrease the  dose of vitamin D supplement from 5000 units daily to 2500-3000 units daily and repeat the labs in 2 months.  Philemon Kingdom, MD PhD [facility] Endocrinology

## 2020-07-11 NOTE — Patient Instructions (Addendum)
Please continue vitamin D 5000 units daily.   Try to get 1200 mg calcium daily, preferentially from the diet.  Please stop at the lab.  Please come back for a follow-up appointment in 1 year.  How Can I Prevent Falls? Men and women with osteoporosis need to take care not to fall down. Falls can break bones. Some reasons people fall are: Poor vision  Poor balance  Certain diseases that affect how you walk  Some types of medicine, such as sleeping pills.  Some tips to help prevent falls outdoors are: Use a cane or walker  Wear rubber-soled shoes so you don't slip  Walk on grass when sidewalks are slippery  In winter, put salt or kitty litter on icy sidewalks.  Some ways to help prevent falls indoors are: Keep rooms free of clutter, especially on floors  Use plastic or carpet runners on slippery floors  Wear low-heeled shoes that provide good support  Do not walk in socks, stockings, or slippers  Be sure carpets and area rugs have skid-proof backs or are tacked to the floor  Be sure stairs are well lit and have rails on both sides  Put grab bars on bathroom walls near tub, shower, and toilet  Use a rubber bath mat in the shower or tub  Keep a flashlight next to your bed  Use a sturdy step stool with a handrail and wide steps  Add more lights in rooms (and night lights) Buy a cordless phone to keep with you so that you don't have to rush to the phone       when it rings and so that you can call for help if you fall.   (adapted from http://www.niams.Nightlife[employer]views.se)  Please check out the following book about best diet for bone health:   Exercise for Strong Bones (from [employer]) There are two types of exercises that are important for building and maintaining bone density:  weight-bearing and muscle-strengthening exercises. Weight-bearing Exercises These exercises include activities that make you move against gravity  while staying upright. Weight-bearing exercises can be high-impact or low-impact. High-impact weight-bearing exercises help build bones and keep them strong. If you have broken a bone due to osteoporosis or are at risk of breaking a bone, you may need to avoid high-impact exercises. If youre not sure, you should check with your healthcare provider. Examples of high-impact weight-bearing exercises are:  Dancing  Doing high-impact aerobics  Hiking  Jogging/running  Jumping Rope  Stair climbing  Tennis Low-impact weight-bearing exercises can also help keep bones strong and are a safe alternative if you cannot do high-impact exercises. Examples of low-impact weight-bearing exercises are:  Using elliptical training machines  Doing low-impact aerobics  Using stair-step machines  Fast walking on a treadmill or outside Muscle-Strengthening Exercises These exercises include activities where you move your body, a weight or some other resistance against gravity. They are also known as resistance exercises and include:  Lifting weights  Using elastic exercise bands  Using weight machines  Lifting your own body weight  Functional movements, such as standing and rising up on your toes Yoga and Pilates can also improve strength, balance and flexibility. However, certain positions may not be safe for people with osteoporosis or those at increased risk of broken bones. For example, exercises that have you bend forward may increase the chance of breaking a bone in the spine. A physical therapist should be able to help you learn which exercises are safe and appropriate for  you. Non-Impact Exercises Non-impact exercises can help you to improve balance, posture and how well you move in everyday activities. These exercises can also help to increase muscle strength and decrease the risk of falls and broken bones. Some of these exercises include:  Balance exercises that strengthen your legs and  test your balance, such as Tai Chi, can decrease your risk of falls.  Posture exercises that improve your posture and reduce rounded or sloping shoulders can help you decrease the chance of breaking a bone, especially in the spine.  Functional exercises that improve how well you move can help you with everyday activities and decrease your chance of falling and breaking a bone. For example, if you have trouble getting up from a chair or climbing stairs, you should do these activities as exercises. A physical therapist can teach you balance, posture and functional exercises. Starting a New Exercise Program If you havent exercised regularly for a while, check with your healthcare provider before beginning a new exercise program--particularly if you have health problems such as heart disease, diabetes or high blood pressure. If youre at high risk of breaking a bone, you should work with a physical therapist to develop a safe exercise program. Once you have your healthcare providers approval, start slowly. If youve already broken bones in the spine because of osteoporosis, be very careful to avoid activities that require reaching down, bending forward, rapid twisting motions, heavy lifting and those that increase your chance of a fall. As you get started, your muscles may feel sore for a day or two after you exercise. If soreness lasts longer, you may be working too hard and need to ease up. Exercises should be done in a pain-free range of motion. How Much Exercise Do You Need? Weight-bearing exercises 30 minutes on most days of the week. Do a 30-minutesession or multiple sessions spread out throughout the day. The benefits to your bones are the same.   Muscle-strengthening exercises Two to three days per week. If you dont have much time for strengthening/resistance training, do small amounts at a time. You can do just one body part each day. For example do arms one day, legs the next and trunk the  next. You can also spread these exercises out during your normal day.  Balance, posture and functional exercises Every day or as often as needed. You may want to focus on one area more than the others. If you have fallen or lose your balance, spend time doing balance exercises. If you are getting rounded shoulders, work more on posture exercises. If you have trouble climbing stairs or getting up from the couch, do more functional exercises. You can also perform these exercises at one time or spread them during your day. Work with a phyiscal therapist to learn the right exercises for you.   Denosumab: Patient drug information (Up-to-date) Copyright (801)[phone] [place] rights reserved.  Brand Names: [place]  ProliaDelton See What is this drug used for?  It is used to treat soft, brittle bones (osteoporosis).  It is used for bone growth.  It is used when treating some cancers.  It may be given to you for other reasons. Talk with the doctor. What do I need to tell my doctor BEFORE I take this drug?  All products:  If you have an allergy to denosumab or any other part of this drug.  If you are allergic to any drugs like this one, any other drugs, foods, or other substances. Tell  your doctor about the allergy and what signs you had, like rash; hives; itching; shortness of breath; wheezing; cough; swelling of face, lips, tongue, or throat; or any other signs.  If you have low calcium levels.  Prolia:  If you are pregnant or may be pregnant. Do not take this drug if you are pregnant.  This is not a list of all drugs or health problems that interact with this drug.  Tell your doctor and pharmacist about all of your drugs (prescription or OTC, natural products, vitamins) and health problems. You must check to make sure that it is safe for you to take this drug with all of your drugs and health problems. Do not start, stop, or change the dose of any drug without checking with your  doctor. What are some things I need to know or do while I take this drug?  All products:  Tell dentists, surgeons, and other doctors that you use this drug.  This drug may raise the chance of a broken leg. Talk with your doctor.  Have your blood work checked. Talk with your doctor.  Have a bone density test. Talk with your doctor.  Take calcium and vitamin D as you were told by your doctor.  Have a dental exam before starting this drug.  Take good care of your teeth. See a dentist often.  If you smoke, talk with your doctor.  Do not give to a child. Talk with your doctor.  Tell your doctor if you are breast-feeding. You will need to talk about any risks to your baby.  Delton See:  This drug may cause harm to the unborn baby if you take it while you are pregnant. If you get pregnant while taking this drug, call your doctor right away.  Prolia:  Very bad infections have been reported with use of this drug. If you have any infection, are taking antibiotics now or in the recent past, or have many infections, talk with your doctor.  You may have more chance of getting an infection. Wash hands often. Stay away from people with infections, colds, or flu.  Use birth control that you can trust to prevent pregnancy while taking this drug.  If you are a man and your sex partner is pregnant or gets pregnant at any time while you are being treated, talk with your doctor. What are some side effects that I need to call my doctor about right away?  WARNING/CAUTION: Even though it may be rare, some people may have very bad and sometimes deadly side effects when taking a drug. Tell your doctor or get medical help right away if you have any of the following signs or symptoms that may be related to a very bad side effect:  All products:  Signs of an allergic reaction, like rash; hives; itching; red, swollen, blistered, or peeling skin with or without fever; wheezing; tightness in the chest or throat;  trouble breathing or talking; unusual hoarseness; or swelling of the mouth, face, lips, tongue, or throat.  Signs of low calcium levels like muscle cramps or spasms, numbness and tingling, or seizures.  Mouth sores.  Any new or strange groin, hip, or thigh pain.  This drug may cause jawbone problems. The chance may be higher the longer you take this drug. The chance may be higher if you have cancer, dental problems, dentures that do not fit well, anemia, blood clotting problems, or an infection. The chance may also be higher if you are having dental  work or if you are getting chemo, some steroid drugs, or radiation. Call your doctor right away if you have jaw swelling or pain.  Xgeva:  Not hungry.  Muscle pain or weakness.  Seizures.  Shortness of breath.  Prolia:  Signs of infection. These include a fever of 100.69F (38C) or higher, chills, very bad sore throat, ear or sinus pain, cough, more sputum or change in color of sputum, pain with passing urine, mouth sores, wound that will not heal, or anal itching or pain.  Signs of a pancreas problem (pancreatitis) like very bad stomach pain, very bad back pain, or very bad upset stomach or throwing up.  Chest pain.  A heartbeat that does not feel normal.  Very bad skin irritation.  Feeling very tired or weak.  Bladder pain or pain when passing urine or change in how much urine is passed.  Passing urine often.  Swelling in the arms or legs. What are some other side effects of this drug?  All drugs may cause side effects. However, many people have no side effects or only have minor side effects. Call your doctor or get medical help if any of these side effects or any other side effects bother you or do not go away:  Xgeva:  Feeling tired or weak.  Headache.  Upset stomach or throwing up.  Loose stools (diarrhea).  Cough.  Prolia:  Back pain.  Muscle or joint pain.  Sore throat.  Runny nose.  Pain in arms or  legs.  These are not all of the side effects that may occur. If you have questions about side effects, call your doctor. Call your doctor for medical advice about side effects.  You may report side effects to your national health agency. How is this drug best taken?  Use this drug as ordered by your doctor. Read and follow the dosing on the label closely.  It is given as a shot into the fatty part of the skin. What do I do if I miss a dose?  Call the doctor to find out what to do. How do I store and/or throw out this drug?  This drug will be given to you in a hospital or doctor's office. You will not store it at home.  Keep all drugs out of the reach of children and pets.  Check with your pharmacist about how to throw out unused drugs.  General drug facts  If your symptoms or health problems do not get better or if they become worse, call your doctor.  Do not share your drugs with others and do not take anyone else's drugs.  Keep a list of all your drugs (prescription, natural products, vitamins, OTC) with you. Give this list to your doctor.  Talk with the doctor before starting any new drug, including prescription or OTC, natural products, or vitamins.  Some drugs may have another patient information leaflet. If you have any questions about this drug, please talk with your doctor, pharmacist, or other health care provider.  If you think there has been an overdose, call your poison control center or get medical care right away. Be ready to tell or show what was taken, how much, and when it happened.  Zoledronic acid: Patient drug information (Up-to-Date) Copyright [phone] [place] rights reserved.  Brand Names: [place]  Reclast;  Zometa What is this drug used for?  It is used to treat high calcium levels.  It is used when treating some cancers.  It  is used to treat Paget's disease.  It is used to put off or treat soft, brittle bones (osteoporosis).  It may be  given to you for other reasons. Talk with the doctor. What do I need to tell my doctor BEFORE I take this drug?  All products:  If you have an allergy to zoledronic acid or any other part of this drug.  If you are allergic to any drugs like this one, any other drugs, foods, or other substances. Tell your doctor about the allergy and what signs you had, like rash; hives; itching; shortness of breath; wheezing; cough; swelling of face, lips, tongue, or throat; or any other signs.  Reclast:  If you have low calcium levels.  If you have very bad kidney disease.  This is not a list of all drugs or health problems that interact with this drug.  Tell your doctor and pharmacist about all of your drugs (prescription or OTC, natural products, vitamins) and health problems. You must check to make sure that it is safe for you to take this drug with all of your drugs and health problems. Do not start, stop, or change the dose of any drug without checking with your doctor. What are some things I need to know or do while I take this drug?  All products:  Tell dentists, surgeons, and other doctors that you use this drug.  Worsening of asthma has happened in people taking drugs like this one. Talk with your doctor.  This drug may raise the chance of a broken leg. Talk with your doctor.  Have your blood work checked often. Talk with your doctor.  Have a bone density test. Talk with your doctor.  Have a dental exam before starting this drug.  Take good care of your teeth. See a dentist often.  Do not give to a child. Talk with your doctor.  If you are 17 or older, use this drug with care. You could have more side effects.  This drug may cause harm to the unborn baby if you take it while you are pregnant.  Tell your doctor if you are pregnant or plan on getting pregnant. You will need to talk about the benefits and risks of using this drug while you are pregnant.  Tell your doctor if you are  breast-feeding. You will need to talk about any risks to your baby.  Zometa:  Take calcium and vitamin D as you were told by your doctor.  Reclast:  This drug works best when used with calcium/vitamin D and weight-bearing workouts like walking or PT (physical therapy).  Follow the diet and workout plan that your doctor told you about.  Use birth control that you can trust to prevent pregnancy while taking this drug. What are some side effects that I need to call my doctor about right away?  WARNING/CAUTION: Even though it may be rare, some people may have very bad and sometimes deadly side effects when taking a drug. Tell your doctor or get medical help right away if you have any of the following signs or symptoms that may be related to a very bad side effect:  Signs of an allergic reaction, like rash; hives; itching; red, swollen, blistered, or peeling skin with or without fever; wheezing; tightness in the chest or throat; trouble breathing or talking; unusual hoarseness; or swelling of the mouth, face, lips, tongue, or throat.  Signs of low calcium levels like muscle cramps or spasms, numbness and tingling, or seizures.  Signs of kidney problems like unable to pass urine, change in the amount of urine passed, blood in the urine, or a big weight gain.  Very bad bone, joint, or muscle pain.  Any new or strange groin, hip, or thigh pain.  Chest pain.  A heartbeat that does not feel normal.  Slow heartbeat.  Change in eyesight.  Eye pain.  Mouth sores.  Trouble swallowing.  Very bad pain when swallowing.  Any bruising or bleeding.  Pain where the shot was given.  Redness or swelling where the shot is given.  This drug may cause jawbone problems. The chance may be higher the longer you take this drug. The chance may be higher if you have cancer, dental problems, dentures that do not fit well, anemia, blood clotting problems, or an infection. The chance may also be higher  if you are having dental work or if you are getting chemo, some steroid drugs, or radiation. Call your doctor right away if you have jaw swelling or pain. What are some other side effects of this drug?  All drugs may cause side effects. However, many people have no side effects or only have minor side effects. Call your doctor or get medical help if any of these side effects or any other side effects bother you or do not go away:  All products:  Dizziness.  Upset stomach or throwing up.  Irritation where the shot is given.  Feeling tired or weak.  Belly pain.  Headache.  Flu-like signs.  Loose stools (diarrhea).  Muscle or joint pain.  Back pain.  Zometa:  Not able to sleep.  Not hungry.  Hard stools (constipation).  Weight loss.  Cough.  These are not all of the side effects that may occur. If you have questions about side effects, call your doctor. Call your doctor for medical advice about side effects.  You may report side effects to your national health agency. How is this drug best taken?  Use this drug as ordered by your doctor. Read and follow the dosing on the label closely.  All products:  It is given as a shot into a vein over a period of time.  Drink lots of noncaffeine liquids unless told to drink less liquid by your doctor.  Reclast:  Acetaminophen may be given to lower fever and chills.  Drink at least 2 glasses of liquids a few hours before you get this drug. What do I do if I miss a dose?  Call the doctor to find out what to do. How do I store and/or throw out this drug?  This drug will be given to you in a hospital or doctor's office. You will not store it at home.  Keep all drugs out of the reach of children and pets.  Check with your pharmacist about how to throw out unused drugs.  General drug facts  If your symptoms or health problems do not get better or if they become worse, call your doctor.  Do not share your drugs with others  and do not take anyone else's drugs.  Keep a list of all your drugs (prescription, natural products, vitamins, OTC) with you. Give this list to your doctor.  Talk with the doctor before starting any new drug, including prescription or OTC, natural products, or vitamins.  Some drugs may have another patient information leaflet. If you have any questions about this drug, please talk with your doctor, pharmacist, or other health care provider.  If you  think there has been an overdose, call your poison control center or get medical care right away. Be ready to tell or show what was taken, how much, and when it happened.

## 2020-07-12 LAB — VITAMIN D 25 HYDROXY (VIT D DEFICIENCY, FRACTURES): Vit D, 25-Hydroxy: 83.8 ng/mL (ref 30.0–100.0)

## 2020-07-24 ENCOUNTER — Encounter: Payer: Self-pay | Admitting: *Deleted

## 2020-08-15 ENCOUNTER — Other Ambulatory Visit (HOSPITAL_COMMUNITY): Payer: Self-pay | Admitting: Cardiology

## 2020-08-15 DIAGNOSIS — R079 Chest pain, unspecified: Secondary | ICD-10-CM

## 2020-08-20 NOTE — Progress Notes (Shared)
[facility] Note  08/28/2020     CHIEF COMPLAINT Patient presents for No chief complaint on file.   HISTORY OF PRESENT ILLNESS: [name] is a 70 y.o. female who presents to the clinic today for:    Referring physician: Unk Pinto, MD [address] [place],  [place] [address]  HISTORICAL INFORMATION:   Selected notes from the MEDICAL RECORD NUMBER Referred by Dr. Quentin Ore for PVD OU   CURRENT MEDICATIONS: Current Outpatient Medications (Ophthalmic Drugs)  Medication Sig  . RESTASIS 0.05 % ophthalmic emulsion instill 1 drop into both eyes twice a day   No current facility-administered medications for this visit. (Ophthalmic Drugs)   Current Outpatient Medications (Other)  Medication Sig  . alendronate (FOSAMAX) 70 MG tablet Take 1 tablet (70 mg total) by mouth every 7 (seven) days. Take with a full glass of water on an empty stomach.  Marland Kitchen aspirin 81 MG tablet Take 81 mg by mouth daily.  . Bacillus Coagulans-Inulin (ALIGN PREBIOTIC-PROBIOTIC PO) Take 1 tablet by mouth daily.  . Cholecalciferol 125 MCG (5000 UT) capsule Take 5,000 Units by mouth daily.  . cyclobenzaprine (FLEXERIL) 10 MG tablet Take 1 tablet (10 mg total) by mouth 2 (two) times daily as needed for muscle spasms.  Marland Kitchen ezetimibe (ZETIA) 10 MG tablet Take   1 tablet   Daily for Cholesterol  . famotidine (PEPCID) 40 MG tablet Take 1 tablet by mouth once daily in the evening  . hyoscyamine (LEVBID) 0.375 MG 12 hr tablet Take by mouth. (Patient not taking: Reported on 07/09/2020)  . loratadine (CLARITIN) 10 MG tablet Take 1 tablet 1-2 times daily  . Lutein 20 MG TABS Take by mouth.  . Multiple Vitamins-Minerals (AIRBORNE) CHEW Chew 1 tablet by mouth as needed.  . Multiple Vitamins-Minerals (WOMENS MULTI VITAMIN & MINERAL PO) Take 1 tablet by mouth daily.  Marland Kitchen omeprazole (PRILOSEC) 40 MG capsule Take 1 capsule (40 mg total) by mouth daily.  Marland Kitchen OVER THE COUNTER  MEDICATION Patient takes MOM as needed.  . Polyethylene Glycol 3350 (MIRALAX PO) Take by mouth at bedtime.  . rosuvastatin (CRESTOR) 5 MG tablet Take     1 tablet     Daily      for Cholesterol  . Triamcinolone Acetonide (NASACORT AQ NA) Place into the nose.   No current facility-administered medications for this visit. (Other)      REVIEW OF SYSTEMS:    ALLERGIES Allergies  Allergen Reactions  . Bactrim [Sulfamethoxazole-Trimethoprim] Other (See Comments)    Patient preference to take medication without sulfa  . Keflex [Cephalexin]     Rash; chest pain    PAST MEDICAL HISTORY Past Medical History:  Diagnosis Date  . Arthritis    cervical and lumbar  . Depression   . GERD (gastroesophageal reflux disease)   . Hyperlipidemia   . IBS (irritable bowel syndrome)   . Osteoporosis   . Prediabetes   . Vitamin D deficiency    Past Surgical History:  Procedure Laterality Date  . CARDIAC CATHETERIZATION  2011   normal (Dr. Terrence Dupont)  . RADIOACTIVE SEED GUIDED EXCISIONAL BREAST BIOPSY Right 03/11/2016   Procedure: RIGHT RADIOACTIVE SEED GUIDED EXCISIONAL BREAST BIOPSY;  Surgeon: Rolm Bookbinder, MD;  Location: [facility];  Service: General;  Laterality: Right;    FAMILY HISTORY Family History  Problem Relation Age of Onset  . Hypertension Mother   . Other Mother  surgeries involving upper back  . Diabetes Father   . Heart failure Father   . Cancer Maternal Grandmother        Lung  . Allergic rhinitis Neg Hx   . Angioedema Neg Hx   . Asthma Neg Hx   . Atopy Neg Hx   . Eczema Neg Hx   . Immunodeficiency Neg Hx   . Urticaria Neg Hx     SOCIAL HISTORY Social History   Tobacco Use  . Smoking status: Never Smoker  . Smokeless tobacco: Never Used  Vaping Use  . Vaping Use: Never used  Substance Use Topics  . Alcohol use: Not Currently  . Drug use: No         OPHTHALMIC EXAM:  Not recorded     IMAGING AND PROCEDURES  Imaging and  Procedures for 08/28/2020           ASSESSMENT/PLAN:  No diagnosis found.  1,2. PVD / vitreous syneresis OU  - onset in fall/winter 2021, since then has had mild intermittent persistent photopsias OU -- noted in low light / dark environments mostly assodciated with eye movement  - Discussed findings and prognosis  - No RT or RD on 360 scleral depressed exam  - Reviewed s/s of RT/RD  - Strict return precautions for any such RT/RD signs/symptoms  - f/u in 6-8 wks -- DFE/OCT  3. Mixed Cataract OU  - The symptoms of cataract, surgical options, and treatments and risks were discussed with patient. - discussed diagnosis and progression - under the expert management of Dr. Kathlen Mody   Ophthalmic Meds Ordered this visit:  No orders of the defined types were placed in this encounter.      No follow-ups on file.  There are no Patient Instructions on file for this visit.   Explained the diagnoses, plan, and follow up with the patient and they expressed understanding.  Patient expressed understanding of the importance of proper follow up care.   This document serves as a record of services personally performed by Gardiner Sleeper, MD, PhD. It was created on their behalf by Estill Bakes, COT an ophthalmic technician. The creation of this record is the provider's dictation and/or activities during the visit.    Electronically signed by: Estill Bakes, COT 3.7.22 @ 8:23 AM  Abbreviations: M myopia (nearsighted); A astigmatism; H hyperopia (farsighted); P presbyopia; Mrx spectacle prescription;  CTL contact lenses; OD right eye; OS left eye; OU both eyes  XT exotropia; ET esotropia; PEK punctate epithelial keratitis; PEE punctate epithelial erosions; DES dry eye syndrome; MGD meibomian gland dysfunction; ATs artificial tears; PFAT's preservative free artificial tears; [place] nuclear sclerotic cataract; PSC posterior subcapsular cataract; ERM epi-retinal membrane; PVD posterior vitreous  detachment; RD retinal detachment; DM diabetes mellitus; DR diabetic retinopathy; NPDR non-proliferative diabetic retinopathy; PDR proliferative diabetic retinopathy; CSME clinically significant macular edema; DME diabetic macular edema; dbh dot blot hemorrhages; CWS cotton wool spot; POAG primary open angle glaucoma; C/D cup-to-disc ratio; HVF humphrey visual field; GVF goldmann visual field; OCT optical coherence tomography; IOP intraocular pressure; BRVO Branch retinal vein occlusion; CRVO central retinal vein occlusion; CRAO central retinal artery occlusion; BRAO branch retinal artery occlusion; RT retinal tear; SB scleral buckle; PPV pars plana vitrectomy; VH Vitreous hemorrhage; PRP panretinal laser photocoagulation; IVK intravitreal kenalog; VMT vitreomacular traction; MH Macular hole;  NVD neovascularization of the disc; NVE neovascularization elsewhere; AREDS age related eye disease study; ARMD age related macular degeneration; POAG primary open angle glaucoma; EBMD epithelial/anterior basement membrane  dystrophy; ACIOL anterior chamber intraocular lens; IOL intraocular lens; PCIOL posterior chamber intraocular lens; Phaco/IOL phacoemulsification with intraocular lens placement; [place] photorefractive keratectomy; LASIK laser assisted in situ keratomileusis; HTN hypertension; DM diabetes mellitus; COPD chronic obstructive pulmonary disease

## 2020-08-21 ENCOUNTER — Ambulatory Visit: Payer: Medicare Other | Admitting: Allergy and Immunology

## 2020-08-21 ENCOUNTER — Telehealth: Payer: Self-pay

## 2020-08-21 MED ORDER — LORATADINE 10 MG PO TABS: ORAL_TABLET | ORAL | 0 refills | Status: DC

## 2020-08-21 NOTE — Telephone Encounter (Signed)
Patient called to cancel her appointment same day. Patient didn't give reason as to why she is canceling the same day. Patient rescheduled to 09/25/20 with Dr Neldon Mc. She is requesting a refill on Loratadine 10 mg - 1 tablet 1-2 times per day.   [place]

## 2020-08-21 NOTE — Telephone Encounter (Signed)
Loratadine was sent in to [facility] on [address].  Patient was called.  Voicemail was full unable to leave a message.  Patient does have an appointment with Dr. Neldon Mc 09/25/2020

## 2020-08-22 ENCOUNTER — Ambulatory Visit (HOSPITAL_COMMUNITY): Payer: Medicare Other

## 2020-08-27 ENCOUNTER — Encounter: Payer: Self-pay | Admitting: Obstetrics and Gynecology

## 2020-08-27 ENCOUNTER — Other Ambulatory Visit: Payer: Self-pay

## 2020-08-27 ENCOUNTER — Ambulatory Visit: Payer: Medicare Other | Admitting: Obstetrics and Gynecology

## 2020-08-27 VITALS — BP 122/64 | HR 64 | Ht 65.0 in | Wt 144.0 lb

## 2020-08-27 DIAGNOSIS — R194 Change in bowel habit: Secondary | ICD-10-CM | POA: Insufficient documentation

## 2020-08-27 DIAGNOSIS — K824 Cholesterolosis of gallbladder: Secondary | ICD-10-CM | POA: Insufficient documentation

## 2020-08-27 DIAGNOSIS — M940 Chondrocostal junction syndrome [Tietze]: Secondary | ICD-10-CM

## 2020-08-27 DIAGNOSIS — N950 Postmenopausal bleeding: Secondary | ICD-10-CM

## 2020-08-27 DIAGNOSIS — K5904 Chronic idiopathic constipation: Secondary | ICD-10-CM | POA: Insufficient documentation

## 2020-08-27 DIAGNOSIS — R079 Chest pain, unspecified: Secondary | ICD-10-CM | POA: Insufficient documentation

## 2020-08-27 DIAGNOSIS — R1033 Periumbilical pain: Secondary | ICD-10-CM | POA: Insufficient documentation

## 2020-08-27 DIAGNOSIS — R110 Nausea: Secondary | ICD-10-CM | POA: Insufficient documentation

## 2020-08-27 DIAGNOSIS — R102 Pelvic and perineal pain: Secondary | ICD-10-CM

## 2020-08-27 DIAGNOSIS — K581 Irritable bowel syndrome with constipation: Secondary | ICD-10-CM | POA: Diagnosis not present

## 2020-08-27 DIAGNOSIS — K625 Hemorrhage of anus and rectum: Secondary | ICD-10-CM | POA: Insufficient documentation

## 2020-08-27 DIAGNOSIS — N882 Stricture and stenosis of cervix uteri: Secondary | ICD-10-CM | POA: Diagnosis not present

## 2020-08-27 DIAGNOSIS — R634 Abnormal weight loss: Secondary | ICD-10-CM | POA: Insufficient documentation

## 2020-08-27 DIAGNOSIS — R933 Abnormal findings on diagnostic imaging of other parts of digestive tract: Secondary | ICD-10-CM | POA: Insufficient documentation

## 2020-08-27 HISTORY — DX: Chondrocostal junction syndrome (tietze): M94.0

## 2020-08-27 MED ORDER — MISOPROSTOL 200 MCG PO TABS: ORAL_TABLET | ORAL | 0 refills | Status: DC

## 2020-08-27 NOTE — Progress Notes (Signed)
GYNECOLOGY  VISIT   HPI: 70 y.o.   Married Black or [place] American Not Hispanic or Latino  female   [id number] with No LMP recorded. Patient is postmenopausal.   here for pelvic cramping/ stabbing on Jan 29 last for about one hour. She has been having a yellowish/ grey discharge. She had been having more frequent nausea.   She has had intermittent pelvic cramping since the end of January. Doesn't seem to be related to eating. The cramping can occur a couple of times a day, lasts for a couple of hours. The pain is up to a 4/10 in severity, limits her activities, just doesn't feel well.  She has IBS with constipation. She is on miralax qhs. She did have one episode of increased pain after a BM. Mostly her BM's are daily, occasionally every 2 day. She has seen GI, was told to continue the miralax.   Sexually active, she has some entry and intermittent deep dyspareunia.   4 weeks ago she had some light pink vaginal discharge, then turned to a brownish/gray. This was 3 weeks ago. From time to time she will have a yellow dot of d/c. No itching, burning or irritation,  Last pap in 10/21 was normal, + for BV.   No urinary c/o.   She had a normal pelvic ultrasound in 11/20. Images were reviewed, she had fluid in her cavity and a thin endometrium.   GYNECOLOGIC HISTORY: No LMP recorded. Patient is postmenopausal. Contraception:PMP Menopausal hormone therapy: none         OB History    Gravida  0   Para  0   Term  0   Preterm  0   AB  0   Living  0     SAB  0   IAB  0   Ectopic  0   Multiple  0   Live Births  0              Patient Active Problem List   Diagnosis Date Noted  . Environmental allergies 04/24/2020  . Fear of side effects of medication 08/28/2019  . Upper airway cough syndrome 05/04/2019  . DOE (dyspnea on exertion) 05/03/2019  . 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 Osteoporosis 08/11/2017  . Abnormal glucose 06/25/2017  . Medication management 12/12/2014  . Labile  hypertension 08/09/2014  . GERD (gastroesophageal reflux disease)   . Vitamin D deficiency   . Hyperlipidemia, mixed     Past Medical History:  Diagnosis Date  . Arthritis    cervical and lumbar  . Depression   . GERD (gastroesophageal reflux disease)   . Hyperlipidemia   . IBS (irritable bowel syndrome)   . Osteoporosis   . Prediabetes   . Vitamin D deficiency     Past Surgical History:  Procedure Laterality Date  . CARDIAC CATHETERIZATION  2011   normal (Dr. Terrence Dupont)  . RADIOACTIVE SEED GUIDED EXCISIONAL BREAST BIOPSY Right 03/11/2016   Procedure: RIGHT RADIOACTIVE SEED GUIDED EXCISIONAL BREAST BIOPSY;  Surgeon: Rolm Bookbinder, MD;  Location: [facility];  Service: General;  Laterality: Right;    Current Outpatient Medications  Medication Sig Dispense Refill  . aspirin 81 MG tablet Take 81 mg by mouth daily.    . Bacillus Coagulans-Inulin (ALIGN PREBIOTIC-PROBIOTIC PO) Take 1 tablet by mouth daily.    . Cholecalciferol 125 MCG (5000 UT) capsule Take 5,000 Units by mouth daily.    Marland Kitchen ezetimibe (ZETIA) 10 MG tablet Take   1 tablet  Daily for Cholesterol 90 tablet 0  . famotidine (PEPCID) 40 MG tablet Take 1 tablet by mouth once daily in the evening 30 tablet 5  . loratadine (CLARITIN) 10 MG tablet Take 1 tablet 1-2 times daily 60 tablet 0  . Lutein 20 MG TABS Take by mouth.    . Multiple Vitamins-Minerals (AIRBORNE) CHEW Chew 1 tablet by mouth as needed.    . Multiple Vitamins-Minerals (WOMENS MULTI VITAMIN & MINERAL PO) Take 1 tablet by mouth daily.    Marland Kitchen omeprazole (PRILOSEC) 40 MG capsule Take 1 capsule (40 mg total) by mouth daily. 90 capsule 1  . OVER THE COUNTER MEDICATION Patient takes MOM as needed.    . Polyethylene Glycol 3350 (MIRALAX PO) Take by mouth at bedtime.    . RESTASIS 0.05 % ophthalmic emulsion instill 1 drop into both eyes twice a day  0  . Triamcinolone Acetonide (NASACORT AQ NA) Place into the nose.     No current  facility-administered medications for this visit.     ALLERGIES: Bactrim [sulfamethoxazole-trimethoprim] and Keflex [cephalexin]  Family History  Problem Relation Age of Onset  . Hypertension Mother   . Other Mother        surgeries involving upper back  . Diabetes Father   . Heart failure Father   . Cancer Maternal Grandmother        Lung  . Allergic rhinitis Neg Hx   . Angioedema Neg Hx   . Asthma Neg Hx   . Atopy Neg Hx   . Eczema Neg Hx   . Immunodeficiency Neg Hx   . Urticaria Neg Hx     Social History   Socioeconomic History  . Marital status: Married    Spouse name: Not on file  . Number of children: 0  . Years of education: Not on file  . Highest education level: Not on file  Occupational History  . Occupation: retired Copywriter, advertising  Tobacco Use  . Smoking status: Never Smoker  . Smokeless tobacco: Never Used  Vaping Use  . Vaping Use: Never used  Substance and Sexual Activity  . Alcohol use: Not Currently  . Drug use: No  . Sexual activity: Yes    Birth control/protection: Post-menopausal    Comment: 1st intercourse 34 yo-5 partners  Other Topics Concern  . Not on file  Social History Narrative   Lives at home with spouse   Right handed   Caffeine: not much, drinks decaf 2 cups/day   Social Determinants of Health   Financial Resource Strain: Not on file  Food Insecurity: Not on file  Transportation Needs: Not on file  Physical Activity: Not on file  Stress: Not on file  Social Connections: Not on file  Intimate Partner Violence: Not on file    Review of Systems  Gastrointestinal: Positive for nausea.  Genitourinary:       Vaginal discharge Lower abdominal discomfort Pain with intercourse  All other systems reviewed and are negative.   PHYSICAL EXAMINATION:    BP 122/64   Pulse 64   Ht 5\' 5"  (1.651 m)   Wt 144 lb (65.3 kg)   SpO2 99%   BMI 23.96 kg/m     General appearance: alert, cooperative and appears stated  age Abdomen: soft, non-tender; non distended, no masses,  no organomegaly  Pelvic: External genitalia:  no lesions              Urethra:  normal appearing urethra with no masses, tenderness or  lesions              Bartholins and Skenes: normal                 Vagina: normal appearing vagina with normal color and discharge, no lesions              Cervix: no lesions and stenotic              Bimanual Exam:  Uterus:  no masses or tenderness              Adnexa: no mass, fullness, tenderness              Rectovaginal: Yes.  .  Confirms.              Anus:  normal sphincter tone, no lesions  Pelvic floor: not tender  Chaperone was present for exam.  1. Pelvic cramping Normal pelvic exam. H/O IBS, suspect pain is GI related but need to r/o GYN source - US PELVIS TRANSVAGINAL NON-OB (TV ONLY); Future  2. Postmenopausal bleeding Return for ultrasound and possible biopsy - US PELVIS TRANSVAGINAL NON-OB (TV ONLY); Future  3. Cervical stenosis (uterine cervix) On exam and c/w prior ultrasound with fluid in the cavity - misoprostol (CYTOTEC) 200 MCG tablet; Place 2 tablets vaginally 6-12 hours prior to your appointment.  Dispense: 2 tablet; Refill: 0  4. Irritable bowel syndrome with constipation Using miralax Discussed trying to eliminate

## 2020-08-28 ENCOUNTER — Encounter (INDEPENDENT_AMBULATORY_CARE_PROVIDER_SITE_OTHER): Payer: Medicare Other | Admitting: Ophthalmology

## 2020-08-28 DIAGNOSIS — H25813 Combined forms of age-related cataract, bilateral: Secondary | ICD-10-CM

## 2020-08-28 DIAGNOSIS — H43813 Vitreous degeneration, bilateral: Secondary | ICD-10-CM

## 2020-08-28 DIAGNOSIS — H3581 Retinal edema: Secondary | ICD-10-CM

## 2020-08-29 ENCOUNTER — Ambulatory Visit (HOSPITAL_COMMUNITY)
Admission: RE | Admit: 2020-08-29 | Discharge: 2020-08-29 | Disposition: A | Discharge status: Home / Self Care | Payer: Medicare Other | Source: Ambulatory Visit | Attending: Cardiology | Admitting: Cardiology

## 2020-08-29 ENCOUNTER — Other Ambulatory Visit: Payer: Self-pay

## 2020-08-29 DIAGNOSIS — R079 Chest pain, unspecified: Secondary | ICD-10-CM | POA: Diagnosis present

## 2020-08-29 LAB — NM MYOCAR MULTI W/SPECT W/WALL MOTION / EF
LV dias vol: 62 mL (ref 46–106)
LV sys vol: 27 mL

## 2020-08-29 MED ORDER — TECHNETIUM TC 99M TETROFOSMIN IV KIT
32.2000 | PACK | Freq: Once | INTRAVENOUS | Status: AC | PRN
  Administered 2020-08-29: 32.2 via INTRAVENOUS

## 2020-08-29 MED ORDER — REGADENOSON 0.4 MG/5ML IV SOLN
INTRAVENOUS | Status: AC
  Administered 2020-08-29: 0.4 mg via INTRAVENOUS
  Filled 2020-08-29: qty 5

## 2020-08-29 MED ORDER — REGADENOSON 0.4 MG/5ML IV SOLN: 0.4000 mg | Freq: Once | INTRAVENOUS | Status: AC

## 2020-08-29 MED ORDER — TECHNETIUM TC 99M TETROFOSMIN IV KIT
10.2000 | PACK | Freq: Once | INTRAVENOUS | Status: AC | PRN
  Administered 2020-08-29: 10.2 via INTRAVENOUS

## 2020-08-29 NOTE — Progress Notes (Signed)
[facility] Note  08/31/2020     CHIEF COMPLAINT Patient presents for Retina Follow Up   HISTORY OF PRESENT ILLNESS: [name] is a 70 y.o. female who presents to the clinic today for:   HPI    Retina Follow Up    Patient presents with  PVD.  In both eyes.  This started months ago.  Severity is mild.  Duration of 8 weeks.  Since onset it is stable.  I, the attending physician,  performed the HPI with the patient and updated documentation appropriately.          Comments    70 y/o female pt here for 8 wk f/u for PVD OU.  No change in [place] OU.  Denies pain, floaters.  Still sees an occasional peripheral FOL OD.  Restasis BID OU.       Last edited by Bernarda Caffey, MD on 08/31/2020 11:48 PM. (History)    pt states the floaters are less, she feels like she had increased blurriness last week, but that has resolved now  Referring physician: Hortencia Pilar, MD [place],  [place] [address]  HISTORICAL INFORMATION:   Selected notes from the MEDICAL RECORD NUMBER Referred by Dr. Quentin Ore for PVD OU LEE:  Ocular Hx- PMH-    CURRENT MEDICATIONS: Current Outpatient Medications (Ophthalmic Drugs)  Medication Sig  . RESTASIS 0.05 % ophthalmic emulsion instill 1 drop into both eyes twice a day   No current facility-administered medications for this visit. (Ophthalmic Drugs)   Current Outpatient Medications (Other)  Medication Sig  . aspirin 81 MG tablet Take 81 mg by mouth daily.  . Bacillus Coagulans-Inulin (ALIGN PREBIOTIC-PROBIOTIC PO) Take 1 tablet by mouth daily.  . Cholecalciferol 125 MCG (5000 UT) capsule Take 5,000 Units by mouth daily.  Marland Kitchen ezetimibe (ZETIA) 10 MG tablet Take   1 tablet   Daily for Cholesterol  . famotidine (PEPCID) 40 MG tablet Take 1 tablet by mouth once daily in the evening  . hyoscyamine (LEVBID) 0.375 MG 12 hr tablet Take by mouth.  . loratadine (CLARITIN) 10 MG tablet Take 1 tablet 1-2  times daily  . Lutein 20 MG TABS Take by mouth.  . misoprostol (CYTOTEC) 200 MCG tablet Place 2 tablets vaginally 6-12 hours prior to your appointment.  . Multiple Vitamins-Minerals (AIRBORNE) CHEW Chew 1 tablet by mouth as needed.  . Multiple Vitamins-Minerals (WOMENS MULTI VITAMIN & MINERAL PO) Take 1 tablet by mouth daily.  . nitroGLYCERIN (NITROSTAT) 0.4 MG SL tablet See admin instructions.  Marland Kitchen omeprazole (PRILOSEC) 40 MG capsule Take 1 capsule (40 mg total) by mouth daily.  Marland Kitchen OVER THE COUNTER MEDICATION Patient takes MOM as needed.  . Polyethylene Glycol 3350 (MIRALAX PO) Take by mouth at bedtime.  . Triamcinolone Acetonide (NASACORT AQ NA) Place into the nose.   No current facility-administered medications for this visit. (Other)      REVIEW OF SYSTEMS: ROS    Positive for: Gastrointestinal, Musculoskeletal, Eyes, Respiratory   Negative for: Constitutional, Neurological, Skin, Genitourinary, HENT, Endocrine, Cardiovascular, Psychiatric, Allergic/Imm, Heme/Lymph   Last edited by Matthew Folks, COA on 08/31/2020  1:37 PM. (History)       ALLERGIES Allergies  Allergen Reactions  . Bactrim [Sulfamethoxazole-Trimethoprim] Other (See Comments)    Patient preference to take medication without sulfa  . Keflex [Cephalexin]     Rash; chest pain    PAST MEDICAL HISTORY Past Medical History:  Diagnosis  Date  . Arthritis    cervical and lumbar  . Cataract    Mixed OU  . Depression   . GERD (gastroesophageal reflux disease)   . Hyperlipidemia   . IBS (irritable bowel syndrome)   . Osteoporosis   . Prediabetes   . Vitamin D deficiency    Past Surgical History:  Procedure Laterality Date  . CARDIAC CATHETERIZATION  2011   normal (Dr. Terrence Dupont)  . RADIOACTIVE SEED GUIDED EXCISIONAL BREAST BIOPSY Right 03/11/2016   Procedure: RIGHT RADIOACTIVE SEED GUIDED EXCISIONAL BREAST BIOPSY;  Surgeon: Rolm Bookbinder, MD;  Location: [facility];  Service: General;   Laterality: Right;    FAMILY HISTORY Family History  Problem Relation Age of Onset  . Hypertension Mother   . Other Mother        surgeries involving upper back  . Diabetes Father   . Heart failure Father   . Cancer Maternal Grandmother        Lung  . Allergic rhinitis Neg Hx   . Angioedema Neg Hx   . Asthma Neg Hx   . Atopy Neg Hx   . Eczema Neg Hx   . Immunodeficiency Neg Hx   . Urticaria Neg Hx     SOCIAL HISTORY Social History   Tobacco Use  . Smoking status: Never Smoker  . Smokeless tobacco: Never Used  Vaping Use  . Vaping Use: Never used  Substance Use Topics  . Alcohol use: Not Currently  . Drug use: No         OPHTHALMIC EXAM:  Base Eye Exam    Visual Acuity (Snellen - Linear)      Right Left   Dist cc 20/20 20/20   Correction: Glasses       Tonometry (Tonopen, 1:39 PM)      Right Left   Pressure 11 12       Pupils      Dark Light Shape React APD   Right 3 2 Round Brisk None   Left 3 2 Round Brisk None       Visual Fields (Counting fingers)      Left Right    Full Full       Extraocular Movement      Right Left    Full, Ortho Full, Ortho       Neuro/Psych    Oriented x3: Yes   Mood/Affect: Normal       Dilation    Both eyes: 1.0% Mydriacyl, 2.5% Phenylephrine @ 1:39 PM        Slit Lamp and Fundus Exam    Slit Lamp Exam      Right Left   Lids/Lashes Dermatochalasis - upper lid Dermatochalasis - upper lid   Conjunctiva/Sclera White and quiet White and quiet   Cornea Trace arcus, mild tear film debris, trace PEE Trace arcus, trace PEE   Anterior Chamber Deep and quiet Deep and quiet   Iris Round and dilated Round and dilated   Lens 2+ Nuclear sclerosis, 2+ Cortical cataract 2+ Nuclear sclerosis, 2+ Cortical cataract   Vitreous Vitreous syneresis, Posterior vitreous detachment Vitreous syneresis, Posterior vitreous detachment, vitreous condensations       Fundus Exam      Right Left   Disc Pink and Sharp, focal temporal  PPA Pink and Sharp, focal temporal PPA, Compact   C/D Ratio 0.3 0.4   Macula Flat, Good foveal reflex, No heme or edema Flat, Good foveal reflex, mild RPE mottling, No heme  or edema   Vessels mild attenuation mild attenuation   Periphery Attached, No RT/RD, mild White without pressure, No heme  Attached, No RT/RD, No heme           IMAGING AND PROCEDURES  Imaging and Procedures for 08/31/2020  OCT, Retina - OU - Both Eyes       Right Eye Quality was good. Central Foveal Thickness: 279. Progression has been stable. Findings include normal foveal contour, no IRF, no SRF.   Left Eye Quality was good. Central Foveal Thickness: 283. Progression has been stable. Findings include normal foveal contour, no IRF, no SRF.   Notes *Images captured and stored on drive  Diagnosis / Impression:  NFP, no IRF/SRF OU  Clinical management:  See below  Abbreviations: NFP - Normal foveal profile. CME - cystoid macular edema. PED - pigment epithelial detachment. IRF - intraretinal fluid. SRF - subretinal fluid. EZ - ellipsoid zone. ERM - epiretinal membrane. ORA - outer retinal atrophy. ORT - outer retinal tubulation. SRHM - subretinal hyper-reflective material. IRHM - intraretinal hyper-reflective material                 ASSESSMENT/PLAN:    ICD-10-CM   1. Posterior vitreous detachment of both eyes  H43.813   2. Retinal edema  H35.81 OCT, Retina - OU - Both Eyes  3. Combined forms of age-related cataract of both eyes  H25.813     1,2. PVD / vitreous syneresis OU  - onset in fall/winter 2021, since then has had mild intermittent persistent photopsias OU -- noted in low light / dark environments mostly associated with eye movement  - today, pt reports symptoms improving and now minimal  - Discussed findings and prognosis  - No RT or RD on 360 scleral depressed exam  - Reviewed s/s of RT/RD  - Strict return precautions for any such RT/RD signs/symptoms  - pt is cleared from a retina  standpoint for release to Dr. Kathlen Mody and resumption of primary eye care  - can f/u here prn  3. Mixed Cataract OU  - The symptoms of cataract, surgical options, and treatments and risks were discussed with patient. - discussed diagnosis and progression - under the expert management of Dr. Kathlen Mody   Ophthalmic Meds Ordered this visit:  No orders of the defined types were placed in this encounter.      Return if symptoms worsen or fail to improve.  There are no Patient Instructions on file for this visit.   Explained the diagnoses, plan, and follow up with the patient and they expressed understanding.  Patient expressed understanding of the importance of proper follow up care.   This document serves as a record of services personally performed by Gardiner Sleeper, MD, PhD. It was created on their behalf by San Jetty. Owens Shark, OA an ophthalmic technician. The creation of this record is the provider's dictation and/or activities during the visit.    Electronically signed by: San Jetty. Marguerita Merles 03.16.2022 11:48 PM   Gardiner Sleeper, M.D., Ph.D. Diseases & Surgery of the Retina and Vitreous [facility]  I have reviewed the above documentation for accuracy and completeness, and I agree with the above. Gardiner Sleeper, M.D., Ph.D. 08/31/20 11:49 PM    Abbreviations: M myopia (nearsighted); A astigmatism; H hyperopia (farsighted); P presbyopia; Mrx spectacle prescription;  CTL contact lenses; OD right eye; OS left eye; OU both eyes  XT exotropia; ET esotropia; PEK punctate epithelial keratitis; PEE punctate  epithelial erosions; DES dry eye syndrome; MGD meibomian gland dysfunction; ATs artificial tears; PFAT's preservative free artificial tears; [place] nuclear sclerotic cataract; PSC posterior subcapsular cataract; ERM epi-retinal membrane; PVD posterior vitreous detachment; RD retinal detachment; DM diabetes mellitus; DR diabetic retinopathy; NPDR non-proliferative  diabetic retinopathy; PDR proliferative diabetic retinopathy; CSME clinically significant macular edema; DME diabetic macular edema; dbh dot blot hemorrhages; CWS cotton wool spot; POAG primary open angle glaucoma; C/D cup-to-disc ratio; HVF humphrey visual field; GVF goldmann visual field; OCT optical coherence tomography; IOP intraocular pressure; BRVO Branch retinal vein occlusion; CRVO central retinal vein occlusion; CRAO central retinal artery occlusion; BRAO branch retinal artery occlusion; RT retinal tear; SB scleral buckle; PPV pars plana vitrectomy; VH Vitreous hemorrhage; PRP panretinal laser photocoagulation; IVK intravitreal kenalog; VMT vitreomacular traction; MH Macular hole;  NVD neovascularization of the disc; NVE neovascularization elsewhere; AREDS age related eye disease study; ARMD age related macular degeneration; POAG primary open angle glaucoma; EBMD epithelial/anterior basement membrane dystrophy; ACIOL anterior chamber intraocular lens; IOL intraocular lens; PCIOL posterior chamber intraocular lens; Phaco/IOL phacoemulsification with intraocular lens placement; [place] photorefractive keratectomy; LASIK laser assisted in situ keratomileusis; HTN hypertension; DM diabetes mellitus; COPD chronic obstructive pulmonary disease

## 2020-08-31 ENCOUNTER — Other Ambulatory Visit: Payer: Self-pay

## 2020-08-31 ENCOUNTER — Encounter (INDEPENDENT_AMBULATORY_CARE_PROVIDER_SITE_OTHER): Payer: Self-pay | Admitting: Ophthalmology

## 2020-08-31 ENCOUNTER — Ambulatory Visit (INDEPENDENT_AMBULATORY_CARE_PROVIDER_SITE_OTHER): Payer: Medicare Other | Admitting: Ophthalmology

## 2020-08-31 DIAGNOSIS — H3581 Retinal edema: Secondary | ICD-10-CM

## 2020-08-31 DIAGNOSIS — H43813 Vitreous degeneration, bilateral: Secondary | ICD-10-CM

## 2020-08-31 DIAGNOSIS — H25813 Combined forms of age-related cataract, bilateral: Secondary | ICD-10-CM | POA: Diagnosis not present

## 2020-09-07 ENCOUNTER — Other Ambulatory Visit: Payer: Self-pay | Admitting: Allergy and Immunology

## 2020-09-25 ENCOUNTER — Other Ambulatory Visit: Payer: Self-pay

## 2020-09-25 ENCOUNTER — Encounter: Payer: Self-pay | Admitting: Allergy and Immunology

## 2020-09-25 ENCOUNTER — Ambulatory Visit: Payer: Medicare Other | Admitting: Allergy and Immunology

## 2020-09-25 VITALS — BP 108/74 | HR 63 | Temp 97.4°F | Resp 18 | Ht 65.0 in | Wt 142.4 lb

## 2020-09-25 DIAGNOSIS — J3089 Other allergic rhinitis: Secondary | ICD-10-CM

## 2020-09-25 DIAGNOSIS — G4720 Circadian rhythm sleep disorder, unspecified type: Secondary | ICD-10-CM | POA: Diagnosis not present

## 2020-09-25 DIAGNOSIS — K219 Gastro-esophageal reflux disease without esophagitis: Secondary | ICD-10-CM | POA: Diagnosis not present

## 2020-09-25 DIAGNOSIS — G43909 Migraine, unspecified, not intractable, without status migrainosus: Secondary | ICD-10-CM

## 2020-09-25 NOTE — Progress Notes (Signed)
[facility]   Follow-up Note  Referring Provider: Unk Pinto, MD Primary Provider: Unk Pinto, MD Date of Office Visit: 09/25/2020  Subjective:   Jodi Barkan Ruotolo (DOB: 17-Nov-1950) is a 70 y.o. female who returns to the [facility] [place] [facility] on 09/25/2020 in re-evaluation of the following:  HPI: Cniyah returns to this clinic in evaluation of allergic rhinitis, LPR, migraine syndrome, and sleep dysfunction.  Her last visit to this clinic was 01 February 2020.  She is doing very well regarding her airway.  She uses loratadine and occasionally some nasal ipratropium and she believes that this has resulted in very good control of the issues with her nose and she has not required an antibiotic or systemic steroid for any type of airway issue.  She thinks her reflux is still intermittently active.  She has some intermittent nausea even in the face of utilizing a proton pump inhibitor and H2 receptor blocker.  She has a gastroenterologist and she is thinking of going back to see Dr. Earlean Shawl at some point in the near future.  Her headaches have improved. In fact she cannot remember having a headache in a while.  She still has some intermittent sleep dysfunction but this is seems to have improved as well.  She never did use her Periactin.  She has had some unusual pelvic discomfort and it was recommended by her gynecologist that she go gluten-free and she has done so for 2 weeks and this is actually helped her pelvic issue.  She has had 3 Pfizer vaccines and a flu vaccine.  Allergies as of 09/25/2020      Reactions   Bactrim [sulfamethoxazole-trimethoprim] Other (See Comments)   Patient preference to take medication without sulfa   Keflex [cephalexin]    Rash; chest pain      Medication List    ALIGN PREBIOTIC-PROBIOTIC PO Take 1 tablet by mouth daily.   aspirin 81 MG tablet Take 81 mg by mouth daily.   Cholecalciferol 125 MCG  (5000 UT) capsule Take 5,000 Units by mouth daily.   ezetimibe 10 MG tablet Commonly known as: ZETIA Take   1 tablet   Daily for Cholesterol   famotidine 40 MG tablet Commonly known as: PEPCID TAKE 1 TABLET BY MOUTH EVERY DAY IN THE EVENING   hyoscyamine 0.375 MG 12 hr tablet Commonly known as: LEVBID Take by mouth.   loratadine 10 MG tablet Commonly known as: CLARITIN Take 1 tablet 1-2 times daily   Lutein 20 MG Tabs Take by mouth.   MIRALAX PO Take by mouth at bedtime.   misoprostol 200 MCG tablet Commonly known as: CYTOTEC Place 2 tablets vaginally 6-12 hours prior to your appointment.   NASACORT AQ NA Place into the nose.   nitroGLYCERIN 0.4 MG SL tablet Commonly known as: NITROSTAT See admin instructions.   omeprazole 40 MG capsule Commonly known as: PRILOSEC Take 1 capsule (40 mg total) by mouth daily.   Restasis 0.05 % ophthalmic emulsion Generic drug: cycloSPORINE instill 1 drop into both eyes twice a day   WOMENS MULTI VITAMIN & MINERAL PO Take 1 tablet by mouth daily.   Airborne Google 1 tablet by mouth as needed.       Past Medical History:  Diagnosis Date  . Arthritis    cervical and lumbar  . Cataract    Mixed OU  . Depression   . GERD (gastroesophageal reflux disease)   . Hyperlipidemia   .  IBS (irritable bowel syndrome)   . Osteoporosis   . Prediabetes   . Vitamin D deficiency     Past Surgical History:  Procedure Laterality Date  . CARDIAC CATHETERIZATION  2011   normal (Dr. Terrence Dupont)  . RADIOACTIVE SEED GUIDED EXCISIONAL BREAST BIOPSY Right 03/11/2016   Procedure: RIGHT RADIOACTIVE SEED GUIDED EXCISIONAL BREAST BIOPSY;  Surgeon: Rolm Bookbinder, MD;  Location: [facility];  Service: General;  Laterality: Right;    Review of systems negative except as noted in HPI / PMHx or noted below:  Review of Systems  Constitutional: Negative.   HENT: Negative.   Eyes: Negative.   Respiratory: Negative.    Cardiovascular: Negative.   Gastrointestinal: Negative.   Genitourinary: Negative.   Musculoskeletal: Negative.   Skin: Negative.   Neurological: Negative.   Endo/Heme/Allergies: Negative.   Psychiatric/Behavioral: Negative.      Objective:   Vitals:   09/25/20 1552  BP: 108/74  Pulse: 63  Resp: 18  Temp: (!) 97.4 F (36.3 C)  SpO2: 97%   Height: 5\' 5"  (165.1 cm)  Weight: 142 lb 6.4 oz (64.6 kg)   Physical Exam Constitutional:      Appearance: She is not diaphoretic.  HENT:     Head: Normocephalic.     Right Ear: Tympanic membrane, ear canal and external ear normal.     Left Ear: Tympanic membrane, ear canal and external ear normal.     Nose: Nose normal. No mucosal edema or rhinorrhea.     Mouth/Throat:     Pharynx: Uvula midline. No oropharyngeal exudate.  Eyes:     Conjunctiva/sclera: Conjunctivae normal.  Neck:     Thyroid: No thyromegaly.     Trachea: Trachea normal. No tracheal tenderness or tracheal deviation.  Cardiovascular:     Rate and Rhythm: Normal rate and regular rhythm.     Heart sounds: Normal heart sounds, S1 normal and S2 normal. No murmur heard.   Pulmonary:     Effort: No respiratory distress.     Breath sounds: Normal breath sounds. No stridor. No wheezing or rales.  Lymphadenopathy:     Head:     Right side of head: No tonsillar adenopathy.     Left side of head: No tonsillar adenopathy.     Cervical: No cervical adenopathy.  Skin:    Findings: No erythema or rash.     Nails: There is no clubbing.  Neurological:     Mental Status: She is alert.     Diagnostics: none  Assessment and Plan:   1. Perennial allergic rhinitis   2. LPRD (laryngopharyngeal reflux disease)   3. Migraine syndrome   4. 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  Allergen avoidance measures - dust mite, pollens, molds, cat  2.  Continue to treat and prevent inflammation:   A.  Loratadine 10 mg - 1 tablet 1-2 times per day  3.   Continue to Treat and prevent reflux:   A.  omeprazole to 40 mg in AM  B.  famotidine 40 mg in evening  C.  Visit with GI doctor if still with nausea  4. If needed:   A. Nasal ipratropium 0.06% - 1-2 sprays each nostril every 6 hours to dry nose.  5. Return to clinic in 6 months or earlier if problem.    Shanikwa appears to be doing okay on her current plan of using loratadine and occasional nasal ipratropium regarding her upper airway issue and  her reflux is under marginal control on her current plan and she still has intermittent nausea.  It could be best for her to visit with her gastroenterologist to evaluate this issue in more detail.  Fortunately, her headaches are better which is a big improvement for her quality of life.  I will see her back in this clinic in 6 months or earlier if there is a problem.  Allena Katz, MD Allergy / Immunology [place]

## 2020-09-25 NOTE — Patient Instructions (Signed)
  1.  Allergen avoidance measures - dust mite, pollens, molds, cat  2.  Continue to treat and prevent inflammation:   A.  Loratadine 10 mg - 1 tablet 1-2 times per day  3.  Continue to Treat and prevent reflux:   A.  omeprazole to 40 mg in AM  B.  famotidine 40 mg in evening  C.  Visit with GI doctor if still with nausea  4. If needed:   A. Nasal ipratropium 0.06% - 1-2 sprays each nostril every 6 hours to dry nose.  5. Return to clinic in 6 months or earlier if problem.

## 2020-09-26 ENCOUNTER — Encounter: Payer: Self-pay | Admitting: Allergy and Immunology

## 2020-09-26 ENCOUNTER — Telehealth: Payer: Self-pay

## 2020-09-26 NOTE — Telephone Encounter (Signed)
Patient called because she has two Cytotec tabs to insert vaginally 6-12 hours prior to appt She was asking for guidance as to best time to insert them. She goes to bed late after 11pm so I suggested she place them at hs and they can work overnight. Her appointment is 11am.

## 2020-09-27 ENCOUNTER — Encounter: Payer: Self-pay | Admitting: Obstetrics and Gynecology

## 2020-09-27 ENCOUNTER — Other Ambulatory Visit: Payer: Self-pay

## 2020-09-27 ENCOUNTER — Ambulatory Visit: Payer: Medicare Other | Admitting: Obstetrics and Gynecology

## 2020-09-27 ENCOUNTER — Ambulatory Visit (INDEPENDENT_AMBULATORY_CARE_PROVIDER_SITE_OTHER): Payer: Medicare Other

## 2020-09-27 ENCOUNTER — Telehealth: Payer: Self-pay

## 2020-09-27 VITALS — BP 118/76

## 2020-09-27 DIAGNOSIS — N950 Postmenopausal bleeding: Secondary | ICD-10-CM

## 2020-09-27 DIAGNOSIS — N941 Unspecified dyspareunia: Secondary | ICD-10-CM

## 2020-09-27 DIAGNOSIS — N952 Postmenopausal atrophic vaginitis: Secondary | ICD-10-CM

## 2020-09-27 DIAGNOSIS — R102 Pelvic and perineal pain: Secondary | ICD-10-CM

## 2020-09-27 NOTE — Patient Instructions (Signed)
You can try uberlube or astroglide (silicone one) for lubrication with intercourse. Coconut oil is another options.   Atrophic Vaginitis  Atrophic vaginitis is a condition in which the tissues that line the vagina become dry and thin. This condition is most common in women who have stopped having regular menstrual periods (are in menopause). This usually starts when a woman is 25 to 69 years old. That is the time when a woman's estrogen levels begin to decrease. Estrogen is a female hormone. It helps to keep the tissues of the vagina moist. It stimulates the vagina to produce a clear fluid that lubricates the vagina for sex. This fluid also protects the vagina from infection. Lack of estrogen can cause the lining of the vagina to get thinner and dryer. The vagina may also shrink in size. It may become less elastic. Atrophic vaginitis tends to get worse over time as a woman's estrogen level drops. What are the causes? This condition is caused by the normal drop in estrogen that happens around the time of menopause. What increases the risk? Certain conditions or situations may lower a woman's estrogen level, leading to a higher risk for atrophic vaginitis. You are more likely to develop this condition if:  You are taking medicines that block estrogen.  You have had your ovaries removed.  You are being treated for cancer with radiation or medicines (chemotherapy).  You have given birth or are breastfeeding.  You are older than age 39.  You smoke. What are the signs or symptoms? Symptoms of this condition include:  Pain, soreness, a feeling of pressure, or bleeding during sex (dyspareunia).  Vaginal burning, irritation, or itching.  Pain or bleeding when a speculum is used in a vaginal exam.  Having burning pain while urinating.  Vaginal discharge. In some cases, there are no symptoms. How is this diagnosed? This condition is diagnosed based on your medical history and a physical  exam. This will include a pelvic exam that checks the vaginal tissues. Though rare, you may also have other tests, including:  A urine test.  A test that checks the acid balance in your vagina (acid balance test). How is this treated? Treatment for this condition depends on how severe your symptoms are. Treatment may include:  Using an over-the-counter vaginal lubricant before sex.  Using a long-acting vaginal moisturizer.  Using low-dose estrogen for moderate to severe symptoms that do not respond to other treatments. Options include creams, tablets, and inserts (vaginal rings). Before you use a vaginal estrogen, tell your health care provider if you have a history of: ? Breast cancer. ? Endometrial cancer. ? Blood clots. If you are not sexually active and your symptoms are very mild, you may not need treatment. Follow these instructions at home: Medicines  Take over-the-counter and prescription medicines only as told by your health care provider.  Do not use herbal or alternative medicines unless your health care provider says that you can.  Use over-the-counter creams, lubricants, or moisturizers for dryness only as told by your health care provider. General instructions  If your atrophic vaginitis is caused by menopause, discuss all of your menopause symptoms and treatment options with your health care provider.  Do not douche.  Do not use products that can make your vagina dry. These include: ? Scented feminine sprays. ? Scented tampons. ? Scented soaps.  Vaginal sex can help to improve blood flow and elasticity of vaginal tissue. If you choose to have sex and it hurts, try using  a water-soluble lubricant or moisturizer right before having sex. Contact a health care provider if:  Your discharge looks different than normal.  Your vagina has an unusual smell.  You have new symptoms.  Your symptoms do not improve with treatment.  Your symptoms get  worse. Summary  Atrophic vaginitis is a condition in which the tissues that line the vagina become dry and thin. It is most common in women who have stopped having regular menstrual periods (are in menopause).  Treatment options include using vaginal lubricants and low-dose vaginal estrogen.  Contact a health care provider if your vagina has an unusual smell, or if your symptoms get worse or do not improve after treatment. This information is not intended to replace advice given to you by your health care provider. Make sure you discuss any questions you have with your health care provider. Document Revised: 12/01/2019 Document Reviewed: 12/01/2019 Elsevier Patient Education  [place].

## 2020-09-27 NOTE — Progress Notes (Signed)
GYNECOLOGY  VISIT   HPI: 70 y.o.   Married Black or [place] American Not Hispanic or Latino  female   [id number] with No LMP recorded. Patient is postmenopausal.   here for evaluation of pelvic cramping and postmenopausal spotting. H/O IBS. H/O cervical stenosis, pretreated with cytotec.   Last pap in 10/21 was normal.   She hasn't had any further spotting until today, after the ultrasound she noticed a little pink d/c. The ultrasound was a little uncomfortable.  She does have issues with vaginal dryness and entry dyspareunia. Some help with KY jelly. She sometimes notices spotting after intercourse.   She reports a recent normal cardiac stress test.   GYNECOLOGIC HISTORY: No LMP recorded. Patient is postmenopausal. Contraception:PMP Menopausal hormone therapy: none.        OB History    Gravida  0   Para  0   Term  0   Preterm  0   AB  0   Living  0     SAB  0   IAB  0   Ectopic  0   Multiple  0   Live Births  0              Patient Active Problem List   Diagnosis Date Noted  . Abnormal finding on GI tract imaging 08/27/2020  . Abnormal weight loss 08/27/2020  . Change in bowel habit 08/27/2020  . Chest pain 08/27/2020  . 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 Chronic idiopathic constipation 08/27/2020  . Costochondritis 08/27/2020  . Periumbilical pain 24/26/8341  . Nausea 08/27/2020  . Rectal bleeding 08/27/2020  . Environmental allergies 04/24/2020  . Irritable bowel syndrome with constipation 09/02/2019  . Fear of side effects of medication 08/28/2019  . Upper airway cough syndrome 05/04/2019  . DOE (dyspnea on exertion) 05/03/2019  . Pain in right knee 12/27/2018  . 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 Osteoporosis 08/11/2017  . Abnormal glucose 06/25/2017  . Medication management 12/12/2014  . 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 GERD (gastroesophageal reflux disease)   . Vitamin D deficiency   . Hyperlipidemia, mixed     Past Medical History:   Diagnosis Date  . Arthritis    cervical and lumbar  . Cataract    Mixed OU  . Depression   . GERD (gastroesophageal reflux disease)   . Hyperlipidemia   . IBS (irritable bowel syndrome)   . Osteoporosis   . Prediabetes   . Vitamin D deficiency     Past Surgical History:  Procedure Laterality Date  . CARDIAC CATHETERIZATION  2011   normal (Dr. Terrence Dupont)  . RADIOACTIVE SEED GUIDED EXCISIONAL BREAST BIOPSY Right 03/11/2016   Procedure: RIGHT RADIOACTIVE SEED GUIDED EXCISIONAL BREAST BIOPSY;  Surgeon: Rolm Bookbinder, MD;  Location: [facility];  Service: General;  Laterality: Right;    Current Outpatient Medications  Medication Sig Dispense Refill  . aspirin 81 MG tablet Take 81 mg by mouth daily.    . Bacillus Coagulans-Inulin (ALIGN PREBIOTIC-PROBIOTIC PO) Take 1 tablet by mouth daily.    . Cholecalciferol 125 MCG (5000 UT) capsule Take 5,000 Units by mouth daily.    Marland Kitchen ezetimibe (ZETIA) 10 MG tablet Take   1 tablet   Daily for Cholesterol 90 tablet 0  . famotidine (PEPCID) 40 MG tablet TAKE 1 TABLET BY MOUTH EVERY DAY IN THE EVENING 30 tablet 5  . hyoscyamine (LEVBID) 0.375 MG 12 hr tablet Take by mouth.    . loratadine (CLARITIN) 10 MG tablet Take  1 tablet 1-2 times daily 60 tablet 0  . Lutein 20 MG TABS Take by mouth.    . misoprostol (CYTOTEC) 200 MCG tablet Place 2 tablets vaginally 6-12 hours prior to your appointment. 2 tablet 0  . Multiple Vitamins-Minerals (AIRBORNE) CHEW Chew 1 tablet by mouth as needed.    . Multiple Vitamins-Minerals (WOMENS MULTI VITAMIN & MINERAL PO) Take 1 tablet by mouth daily.    . nitroGLYCERIN (NITROSTAT) 0.4 MG SL tablet See admin instructions.    Marland Kitchen omeprazole (PRILOSEC) 40 MG capsule Take 1 capsule (40 mg total) by mouth daily. 90 capsule 1  . Polyethylene Glycol 3350 (MIRALAX PO) Take by mouth at bedtime.    . RESTASIS 0.05 % ophthalmic emulsion instill 1 drop into both eyes twice a day  0  . Triamcinolone Acetonide (NASACORT  AQ NA) Place into the nose.     No current facility-administered medications for this visit.     ALLERGIES: Bactrim [sulfamethoxazole-trimethoprim] and Keflex [cephalexin]  Family History  Problem Relation Age of Onset  . Hypertension Mother   . Other Mother        surgeries involving upper back  . Diabetes Father   . Heart failure Father   . Cancer Maternal Grandmother        Lung  . Allergic rhinitis Neg Hx   . Angioedema Neg Hx   . Asthma Neg Hx   . Atopy Neg Hx   . Eczema Neg Hx   . Immunodeficiency Neg Hx   . Urticaria Neg Hx     Social History   Socioeconomic History  . Marital status: Married    Spouse name: Not on file  . Number of children: 0  . Years of education: Not on file  . Highest education level: Not on file  Occupational History  . Occupation: retired Copywriter, advertising  Tobacco Use  . Smoking status: Never Smoker  . Smokeless tobacco: Never Used  Vaping Use  . Vaping Use: Never used  Substance and Sexual Activity  . Alcohol use: Not Currently  . Drug use: No  . Sexual activity: Yes    Birth control/protection: Post-menopausal    Comment: 1st intercourse 48 yo-5 partners  Other Topics Concern  . Not on file  Social History Narrative   Lives at home with spouse   Right handed   Caffeine: not much, drinks decaf 2 cups/day   Social Determinants of Health   Financial Resource Strain: Not on file  Food Insecurity: Not on file  Transportation Needs: Not on file  Physical Activity: Not on file  Stress: Not on file  Social Connections: Not on file  Intimate Partner Violence: Not on file    ROS   Pelvic ultrasound  Indications: postmenopausal spotting, pelvic cramping  Findings:  Uterus 6.18 x 4.48 x 2.04 cm  Fibroids:   1) 1.56 x 1.15 cm   2) 1.51 x 0.86 cm   3) 1.27 x 0.92 cm   4) 1.61 x 1.13   5) 0.75 x 0.45 cm   6) 0.63 x 0.58 cm  Endometrium 0.8 mm  Left ovary 1.98 x 0.54 x 0.69 cm  Right ovary 1.26 x 0.82 x 0.57  cm  No free fluid   Impression:  Anteverted uterus with multiple intramural and subserosal fibroids, largest 1.6 cm Thin symmetrical endometrium Bilaterally atrophic appearing ovaries    PHYSICAL EXAMINATION:    BP 118/76 (BP Location: Right Arm, Patient Position: Sitting, Cuff Size: Normal)  General appearance: alert, cooperative and appears stated age  Pelvic: External genitalia:  no lesions              Urethra:  normal appearing urethra with no masses, tenderness or lesions              Bartholins and Skenes: normal                 Vagina: very atrophic appearing vagina, slightly friable at the apex. Cytotec tablets removed from her vagina.               Cervix: no lesions  Chaperone was present for exam.  1. Postmenopausal bleeding Ultrasound with thin endometrial stripe. With exam today she is noted to have a very atrophic vagina that is friable. Currently that is where her spotting is coming from. No endometrial biopsy is needed  2. Pelvic cramping No concerning findings on ultrasound. I don't feel her cramping is from a GYN source.   3. Vaginal atrophy Discussed vaginal lubricants Discussed the option of vaginal estrogen, no contraindications.  She will consider and let me know if she wants to start it  4. Female dyspareunia Vaginal atrophy Discussed lubrication and vaginal estrogen.  In addition to reviewing the ultrasound ~ 20 minutes was spent in total patient care. Most of this time was focused on vaginal atrophy, dyspareunia and options for treatment.

## 2020-09-27 NOTE — Telephone Encounter (Signed)
Patient called to ask if she could eat breakfast and take her medications prior to her u/s appt later this morning. Advised that both these things were fine to do.

## 2020-10-02 ENCOUNTER — Ambulatory Visit: Payer: Medicare Other | Admitting: Internal Medicine

## 2020-10-02 ENCOUNTER — Other Ambulatory Visit: Payer: Self-pay | Admitting: Allergy and Immunology

## 2020-10-09 ENCOUNTER — Ambulatory Visit: Payer: Medicare Other | Admitting: Internal Medicine

## 2020-10-10 ENCOUNTER — Other Ambulatory Visit: Payer: Self-pay | Admitting: Adult Health

## 2020-10-15 ENCOUNTER — Ambulatory Visit: Payer: Medicare Other | Admitting: Internal Medicine

## 2020-10-21 NOTE — Progress Notes (Signed)
Future Appointments  Date Time Provider [place]  10/22/2020 11:30 AM Unk Pinto, MD [facility] None  04/02/2021 11:30 AM Neldon Mc, Donnamarie Poag, MD [facility] None  04/03/2021 10:00 AM Unk Pinto, MD [facility] None  04/23/2021  3:00 PM Salvadore Dom, MD [facility] None  07/17/2021  9:00 AM Philemon Kingdom, MD [facility] None    History of Present Illness:       This very nice 70 y.o. MBF presents for 6 month follow up with HTN, HLD, Pre-Diabetes and Vitamin D Deficiency.        Patient is followed for labile HTN & BP has been controlled at home. Today's BP: is at goal - 106/66. In 2011, patient had a Negative Heart Cath by Dr Terrence Dupont.  Patient has had no complaints of any cardiac type chest pain, palpitations, dyspnea / orthopnea / PND, dizziness, claudication, or dependent edema.       Hyperlipidemia is near controlled with diet & Zetia (as patient declines to take Statins).  Patient denies myalgias or other med SE's. Last Lipids were at goal:  Lab Results  Component Value Date   CHOL 193 07/09/2020   HDL 77 07/09/2020   LDLCALC 102 (H) 07/09/2020   TRIG 58 07/09/2020   CHOLHDL 2.5 07/09/2020     Also, the patient has history of PreDiabetes (A1c 5.7% /2014 &6.1% /2016) and has had no symptoms of reactive hypoglycemia, diabetic polys, paresthesias or visual blurring.  Last A1c was near goal:  Lab Results  Component Value Date   HGBA1C 5.7 (H) 03/20/2020          Further, the patient also has history of Vitamin D Deficiency and supplements vitamin D without any suspected side-effects. Last vitamin D was at goal:  Lab Results  Component Value Date   VD25OH 83.8 07/11/2020     Current Outpatient Medications on File Prior to Visit  Medication Sig  . aspirin 81 MG tablet Take  daily.  Marland Kitchen ALIGN PREBIOTIC-PROBIOTIC  Take 1 tablet  daily.  . Cholecalciferol 5000 ucapsule Take  daily.  Marland Kitchen ezetimibe (ZETIA) 10 MG tablet Take   1 tablet   Daily for  Cholesterol  . famotidine (PEPCID) 40 MG tablet TAKE 1 TABLET B EVERY DAY   . loratadine (CLARITIN) 10 MG tablet Take 1 tablet 1-2 times daily  . Lutein 20 MG TABS Take daily  . Multiple Vitamins-Minerals  Chew 1 tablet as needed.  . Multiple Vitamins-Minerals  Take 1 tablet \ daily.  Marland Kitchen NITROSTAT 0.4 MG SL tablet See admin instructions.  Marland Kitchen omeprazole  40 MG capsule TAKE 1 CAPSULE  TWICE DAILY  . MIRALAX  Take at bedtime.  . RESTASIS 0.05 % ophtha emulsion instill 1 drop into both eyes twice a day  . NASACORT AQ  Place into the nose.      Allergies  Allergen Reactions  . Bactrim [Sulfamethoxazole-Trimethoprim] Patient preference       . Keflex [Cephalexin] Rash; chest pain        PMHx:   Past Medical History:  Diagnosis Date  . Arthritis    cervical and lumbar  . Cataract    Mixed OU  . Depression   . GERD (gastroesophageal reflux disease)   . Hyperlipidemia   . IBS (irritable bowel syndrome)   . Osteoporosis   . Prediabetes   . Vitamin D deficiency     Past Surgical History:  Procedure Laterality Date  . CARDIAC CATHETERIZATION  2011  normal (Dr. Terrence Dupont)  . RADIOACTIVE SEED GUIDED EXCISIONAL BREAST BIOPSY Right 03/11/2016   Procedure: RIGHT RADIOACTIVE SEED GUIDED EXCISIONAL BREAST BIOPSY;  Surgeon: Rolm Bookbinder, MD;  Location: [facility];  Service: General;  Laterality: Right;    FHx:    Reviewed / unchanged  SHx:    Reviewed / unchanged   Systems Review:  Constitutional: Denies fever, chills, wt changes, headaches, insomnia, fatigue, night sweats, change in appetite. Eyes: Denies redness, blurred vision, diplopia, discharge, itchy, watery eyes.  ENT: Denies discharge, congestion, post nasal drip, epistaxis, sore throat, earache, hearing loss, dental pain, tinnitus, vertigo, sinus pain, snoring.  CV: Denies chest pain, palpitations, irregular heartbeat, syncope, dyspnea, diaphoresis, orthopnea, PND, claudication or edema. Respiratory:  denies cough, dyspnea, DOE, pleurisy, hoarseness, laryngitis, wheezing.  Gastrointestinal: Denies dysphagia, odynophagia, heartburn, reflux, water brash, abdominal pain or cramps, nausea, vomiting, bloating, diarrhea, constipation, hematemesis, melena, hematochezia  or hemorrhoids. Genitourinary: Denies dysuria, frequency, urgency, nocturia, hesitancy, discharge, hematuria or flank pain. Musculoskeletal: Denies arthralgias, myalgias, stiffness, jt. swelling, pain, limping or strain/sprain.  Skin: Denies pruritus, rash, hives, warts, acne, eczema or change in skin lesion(s). Neuro: No weakness, tremor, incoordination, spasms, paresthesia or pain. Psychiatric: Denies confusion, memory loss or sensory loss. Endo: Denies change in weight, skin or hair change.  Heme/Lymph: No excessive bleeding, bruising or enlarged lymph nodes.  Physical Exam  BP 106/66   Pulse (!) 42   Temp 97.7 F (36.5 C)   Resp 16   Ht 5\' 5"  (1.651 m)   Wt 142 lb 6.4 oz (64.6 kg)   SpO2 96%   BMI 23.70 kg/m   Appears  well nourished, well groomed  and in no distress.  Eyes: PERRLA, EOMs, conjunctiva no swelling or erythema. Sinuses: No frontal/maxillary tenderness ENT/Mouth: EAC's clear, TM's nl w/o erythema, bulging. Nares clear w/o erythema, swelling, exudates. Oropharynx clear without erythema or exudates. Oral hygiene is good. Tongue normal, non obstructing. Hearing intact.  Neck: Supple. Thyroid not palpable. Car 2+/2+ without bruits, nodes or JVD. Chest: Respirations nl with BS clear & equal w/o rales, rhonchi, wheezing or stridor.  Cor: Heart sounds normal w/ regular rate and rhythm without sig. murmurs, gallops, clicks or rubs. Peripheral pulses normal and equal  without edema.  Abdomen: Soft & bowel sounds normal. Non-tender w/o guarding, rebound, hernias, masses or organomegaly.  Lymphatics: Unremarkable.  Musculoskeletal: Full ROM all peripheral extremities, joint stability, 5/5 strength and normal gait.   Skin: Warm, dry without exposed rashes, lesions or ecchymosis apparent.  Neuro: Cranial nerves intact, reflexes equal bilaterally. Sensory-motor testing grossly intact. Tendon reflexes grossly intact.  Pysch: Alert & oriented x 3.  Insight and judgement nl & appropriate. No ideations.  Assessment and Plan:  1. Labile hypertension  - Continue medication, monitor blood pressure at home.  - Continue DASH diet.  Reminder to go to the ER if any CP,  SOB, nausea, dizziness, severe HA, changes vision/speech.  - CBC with Differential/Platelet - COMPLETE METABOLIC PANEL WITH GFR - Magnesium - TSH  2. Hyperlipidemia, mixed  - Continue diet/meds, exercise,& lifestyle modifications.  - Continue monitor periodic cholesterol/liver & renal functions   - Lipid panel - TSH  3. Abnormal glucose  - Continue diet, exercise  - Lifestyle modifications.  - Monitor appropriate labs  - Hemoglobin A1c - Insulin, random  4. Vitamin D deficiency  - Continue supplementation.  5. Medication management  - CBC with Differential/Platelet - COMPLETE METABOLIC PANEL WITH GFR - Magnesium - Lipid panel - TSH -  Hemoglobin A1c - Insulin, random        Discussed  regular exercise, BP monitoring, weight control to achieve/maintain BMI less than 25 and discussed med and SE's. Recommended labs to assess and monitor clinical status with further disposition pending results of labs.  I discussed the assessment and treatment plan with the patient. The patient was provided an opportunity to ask questions and all were answered. The patient agreed with the plan and demonstrated an understanding of the instructions.  I provided over 30 minutes of exam, counseling, chart review and  complex critical decision making.        The patient was advised to call back or seek an in-person evaluation if the symptoms worsen or if the condition fails to improve as anticipated.   Kirtland Bouchard, MD

## 2020-10-22 ENCOUNTER — Ambulatory Visit: Payer: Medicare Other | Admitting: Internal Medicine

## 2020-10-22 ENCOUNTER — Other Ambulatory Visit: Payer: Self-pay

## 2020-10-22 ENCOUNTER — Encounter: Payer: Self-pay | Admitting: Internal Medicine

## 2020-10-22 VITALS — BP 106/66 | HR 42 | Temp 97.7°F | Resp 16 | Ht 65.0 in | Wt 142.4 lb

## 2020-10-22 DIAGNOSIS — E782 Mixed hyperlipidemia: Secondary | ICD-10-CM

## 2020-10-22 DIAGNOSIS — Z79899 Other long term (current) drug therapy: Secondary | ICD-10-CM

## 2020-10-22 DIAGNOSIS — R7309 Other abnormal glucose: Secondary | ICD-10-CM | POA: Diagnosis not present

## 2020-10-22 DIAGNOSIS — E559 Vitamin D deficiency, unspecified: Secondary | ICD-10-CM | POA: Diagnosis not present

## 2020-10-22 DIAGNOSIS — R0989 Other specified symptoms and signs involving the circulatory and respiratory systems: Secondary | ICD-10-CM | POA: Diagnosis not present

## 2020-10-22 NOTE — Patient Instructions (Signed)

## 2020-10-23 LAB — MAGNESIUM: Magnesium: 2.2 mg/dL (ref 1.5–2.5)

## 2020-10-23 LAB — COMPLETE METABOLIC PANEL WITH GFR
AG Ratio: 1.5 (calc) (ref 1.0–2.5)
ALT: 18 U/L (ref 6–29)
AST: 17 U/L (ref 10–35)
Albumin: 4.1 g/dL (ref 3.6–5.1)
Alkaline phosphatase (APISO): 80 U/L (ref 37–153)
BUN: 14 mg/dL (ref 7–25)
CO2: 30 mmol/L (ref 20–32)
Calcium: 9.8 mg/dL (ref 8.6–10.4)
Chloride: 105 mmol/L (ref 98–110)
Creat: 0.82 mg/dL (ref 0.50–0.99)
GFR, Est African American: 85 mL/min/{1.73_m2} (ref >=60)
GFR, Est Non African American: 74 mL/min/{1.73_m2} (ref >=60)
Globulin: 2.7 g/dL (calc) (ref 1.9–3.7)
Glucose, Bld: 70 mg/dL (ref 65–99)
Potassium: 4.5 mmol/L (ref 3.5–5.3)
Sodium: 141 mmol/L (ref 135–146)
Total Bilirubin: 0.5 mg/dL (ref 0.2–1.2)
Total Protein: 6.8 g/dL (ref 6.1–8.1)

## 2020-10-23 LAB — CBC WITH DIFFERENTIAL/PLATELET
Absolute Monocytes: 304 cells/uL (ref 200–950)
Basophils Absolute: 31 cells/uL (ref 0–200)
Basophils Relative: 1 %
Eosinophils Absolute: 112 cells/uL (ref 15–500)
Eosinophils Relative: 3.6 %
HCT: 40.5 % (ref 35.0–45.0)
Hemoglobin: 13.1 g/dL (ref 11.7–15.5)
Lymphs Abs: 1665 cells/uL (ref 850–3900)
MCH: 28.3 pg (ref 27.0–33.0)
MCHC: 32.3 g/dL (ref 32.0–36.0)
MCV: 87.5 fL (ref 80.0–100.0)
MPV: 12.3 fL (ref 7.5–12.5)
Monocytes Relative: 9.8 %
Neutro Abs: 989 cells/uL — ABNORMAL LOW (ref 1500–7800)
Neutrophils Relative %: 31.9 %
Platelets: 165 10*3/uL (ref 140–400)
RBC: 4.63 10*6/uL (ref 3.80–5.10)
RDW: 12.7 % (ref 11.0–15.0)
Total Lymphocyte: 53.7 %
WBC: 3.1 10*3/uL — ABNORMAL LOW (ref 3.8–10.8)

## 2020-10-23 LAB — LIPID PANEL
Cholesterol: 199 mg/dL (ref <200)
HDL: 77 mg/dL (ref >=50)
LDL Cholesterol (Calc): 109 mg/dL (calc) — ABNORMAL HIGH
Non-HDL Cholesterol (Calc): 122 mg/dL (calc) (ref <130)
Total CHOL/HDL Ratio: 2.6 (calc) (ref <5.0)
Triglycerides: 52 mg/dL (ref <150)

## 2020-10-23 LAB — HEMOGLOBIN A1C
Hgb A1c MFr Bld: 5.8 % of total Hgb — ABNORMAL HIGH (ref <5.7)
Mean Plasma Glucose: 120 mg/dL
eAG (mmol/L): 6.6 mmol/L

## 2020-10-23 LAB — TSH: TSH: 1.46 mIU/L (ref 0.40–4.50)

## 2020-10-23 LAB — INSULIN, RANDOM: Insulin: 7.4 u[IU]/mL

## 2020-10-23 NOTE — Progress Notes (Signed)
============================================================ - Test results slightly outside the reference range are not unusual. If there is anything important, I will review this with you,  otherwise it is considered normal test values.  If you have further questions,  please do not hesitate to contact me at the office or via My Chart.  ============================================================ ============================================================  -  Total Chol = 199 - elevated            (  Ideal or Goal is less than 180  )   - and Bad /Dangerous LDL Chol =109 - also too high            (  Ideal or Goal is less than 70  !  )   - Need better  /Stricter Diet   - Cholesterol is too high - Recommend low cholesterol diet   - Cholesterol only comes from animal sources  - ie. meat, dairy, egg yolks  - Eat all the vegetables you want.  - Avoid meat, especially red meat - Beef AND Pork .  - Avoid cheese & dairy - milk & ice cream.     - Cheese is the most concentrated form of trans-fats which  is the worst thing to clog up our arteries.   - Veggie cheese is OK which can be found in the fresh  produce section at [employer] or [employer] or [employer] ============================================================ ============================================================  -  A1c = 5.8%   ,Blood sugar and A1c are elevated in the borderline and  early or pre-diabetes range which has the same   300% increased risk for heart attack, stroke, cancer and   alzheimer- type vascular dementia as full blown diabetes.   But the good news is that diet, exercise with  weight loss can cure the early diabetes at this point.  Your blood sugar and A1c are elevated.    Being diabetic has a  300% increased risk for heart attack,  stroke, cancer, and alzheimer- type vascular dementia.   It is very important that you work harder with diet by  avoiding all foods that are white  except chicken,   fish & calliflower.  - Avoid white rice  (brown & wild rice is OK),   - Avoid white potatoes  (sweet potatoes in moderation is OK),   White bread or wheat bread or anything made out of   white flour like bagels, donuts, rolls, buns, biscuits, cakes,  - pastries, cookies, pizza crust, and pasta (made from  white flour & egg whites)   - vegetarian pasta or spinach or wheat pasta is OK.  - Multigrain breads like Arnold's, Pepperidge Farm or   multigrain sandwich thins or high fiber breads like   Eureka bread or "Dave's Killer" breads that are  4 to 5 grams fiber per slice !  are best.    Diet, exercise and weight loss can reverse and cure  diabetes in the early stages.    - Diet, exercise and weight loss is very important in the   control and prevention of complications of diabetes which  affects every system in your body, ie.   -Brain - dementia/stroke,  - eyes - glaucoma/blindness,  - heart - heart attack/heart failure,  - kidneys - dialysis,  - stomach - gastric paralysis,  - intestines - malabsorption,  - nerves - severe painful neuritis,  - circulation - gangrene & loss of a leg(s)  - and finally  . . . . . . . . . . . . . . . . . Marland Kitchen    -  cancer and Alzheimers. ============================================================ ============================================================

## 2020-10-31 ENCOUNTER — Ambulatory Visit: Payer: Medicare Other | Admitting: Internal Medicine

## 2020-11-01 ENCOUNTER — Encounter: Payer: Self-pay | Admitting: Internal Medicine

## 2020-11-01 LAB — HM DIABETES EYE EXAM

## 2020-12-11 ENCOUNTER — Ambulatory Visit: Payer: Medicare Other | Admitting: Podiatrist

## 2020-12-11 ENCOUNTER — Other Ambulatory Visit: Payer: Self-pay

## 2020-12-11 ENCOUNTER — Encounter: Payer: Self-pay | Admitting: Podiatrist

## 2020-12-11 ENCOUNTER — Ambulatory Visit (INDEPENDENT_AMBULATORY_CARE_PROVIDER_SITE_OTHER): Payer: Medicare Other

## 2020-12-11 DIAGNOSIS — M779 Enthesopathy, unspecified: Secondary | ICD-10-CM | POA: Diagnosis not present

## 2020-12-11 DIAGNOSIS — M79674 Pain in right toe(s): Secondary | ICD-10-CM

## 2020-12-11 NOTE — Progress Notes (Signed)
Chief Complaint  Patient presents with   Toe Pain    Right 4th toe numbness associated with occasional stabbing pains x 7 months. Pt is in prediabetic stage with recent A1c of 5.8.      HPI: Patient is 70 y.o. female who presents today for occasional stabbing pains and numbness in the fourth toe of the right foot.  She relates the skin on the area where the nail was removed is not bothering her and she sometimes files it with an Estate manager/land agent.  She is pre diabetic with last HgA1c of 5.8.     Allergies  Allergen Reactions   Bactrim [Sulfamethoxazole-Trimethoprim] Other (See Comments)    Patient preference to take medication without sulfa   Keflex [Cephalexin]     Rash; chest pain    Review of systems is reviewed and negative.   Physical Exam  Patient is awake, alert, and oriented x 3.  In no acute distress.    Vascular status is intact with palpable pedal pulses DP and PT bilateral and capillary refill time less than 3 seconds bilateral.  No edema or erythema noted.  Neurological exam reveals epicritic and protective sensation grossly intact bilateral.  Dermatological exam reveals skin is supple and dry to bilateral feet.  No open lesions present.   Musculoskeletal exam: Musculature intact with dorsiflexion, plantarflexion, inversion, eversion. Fourth toe joint range of motion normal.   No pain with compression/ palpation of the third or fourth interspace-  no palpable click suggesting neuroma identified.    Assessment:   ICD-10-CM   1. Pain in toe of right foot  M79.674 DG Foot Complete Right    2. Capsulitis  M77.9        Plan: Discussed exam findings with the patient and because of the inconsistent nature of the pain, I recommended a topical cream from [facility] to help with her toe discomfort.  I will order her the cream today. She also asked if it would be OK to pain the toenail of the fourth toe.  I discussed the toenail is completely healed and it is fine for her  to pain her nails if she would like.  She will try out the pain cream and if she continues to have discomfort, she will call.

## 2020-12-11 NOTE — Patient Instructions (Signed)
I have ordered a medication for you that will come from  [facility] in [place]. They should be calling you to verify insurance and will mail the medication to you. If you live close by then you can go by their pharmacy to pick up the medication. Their phone number is [phone]. If you do not hear from them in the next few days, please give us a call at [phone].   

## 2020-12-26 ENCOUNTER — Ambulatory Visit: Payer: Medicare Other | Attending: Internal Medicine

## 2020-12-26 ENCOUNTER — Other Ambulatory Visit: Payer: Self-pay

## 2020-12-26 DIAGNOSIS — Z23 Encounter for immunization: Secondary | ICD-10-CM

## 2020-12-27 ENCOUNTER — Other Ambulatory Visit (HOSPITAL_BASED_OUTPATIENT_CLINIC_OR_DEPARTMENT_OTHER): Payer: Self-pay

## 2020-12-27 MED ORDER — PFIZER-BIONT COVID-19 VAC-TRIS 30 MCG/0.3ML IM SUSP
INTRAMUSCULAR | 0 refills | Status: DC
  Filled 2020-12-27: qty 0.3, 1d supply, fill #0

## 2020-12-27 NOTE — Progress Notes (Signed)
   Covid-19 Vaccination Clinic  Name:  [name]    MRN: [id number] DOB: 02-Aug-1950  12/27/2020  [name] was observed post Covid-19 immunization for 15 minutes without incident. She was provided with Vaccine Information Sheet and instruction to access the V-Safe system.   [name] was instructed to call 911 with any severe reactions post vaccine: Difficulty breathing  Swelling of face and throat  A fast heartbeat  A bad rash all over body  Dizziness and weakness   Immunizations Administered     Name Date Dose VIS Date Route   PFIZER Comrnaty(Gray TOP) Covid-19 Vaccine 12/26/2020  2:25 PM 0.3 mL 05/24/2020 Intramuscular   Manufacturer: [employer]   Lot: [id number]   [place]: [phone]

## 2021-01-23 NOTE — Progress Notes (Signed)
FOLLOW UP  Assessment and Plan:   Locklyn was seen today for headache and acute visit.  Diagnoses and all orders for this visit:  Acute non-recurrent frontal sinusitis -     doxycycline (VIBRAMYCIN) 100 MG capsule; Take 1 capsule (100 mg total) by mouth 2 (two) times daily for 7 days. -     CBC with Differential/Platelet - Pt advised to use Sudafed q 8 hours as needed for the next 7 days along with Claritin and Neti pot.  Pt would like to try these first before starting antibiotic.  If symptoms are not improved within 3 days of starting regimen encouraged use of antibiotic.  Bruising -     CBC with Differential/Platelet -     COMPLETE METABOLIC PANEL WITH GFR -     TSH -     Iron, Total/Total Iron Binding Cap -     Ferritin  Muscle spasm of shoulder region       - Pt to do stretching exercises of her shoulder and suggested therapeutic massage for relief of tension which may assist in headache relief.   Hyperlipidemia, mixed       - Continue Zetia, diet and exercise.  Anemia, unspecified type  -    CBC with Differential/Platelet -     COMPLETE METABOLIC PANEL WITH GFR -     TSH -     Iron, Total/Total Iron Binding Cap -     Ferritin Continue diet and meds as discussed. Further disposition pending results of labs. Discussed med's effects and SE's.   Over 30 minutes of exam, counseling, chart review, and critical decision making was performed Future Appointments  Date Time Provider [place]  04/02/2021 11:30 AM Kozlow, Donnamarie Poag, MD [facility] None  04/03/2021 10:00 AM Unk Pinto, MD [facility] None  04/23/2021  3:00 PM Salvadore Dom, MD [facility] None  07/17/2021  9:00 AM Philemon Kingdom, MD [facility] None     HPI 70 y.o. female  presents for 3 month follow up on hypertension, cholesterol, diabetes .  Having headaches daily x 3 weeks which are intermittent, describes as a constant ache in frontal area of head, sometimes goes up top of head and over the sides .  Tylenol only helps a little. Some runny nose, sinus congestion, post nasal drip and dry cough. Some occasional dizziness but not regular, very infrequent.  Has also been noticing tension in shoulder and neck area which has been an ongoing issue. Has seen chiropractor in the past.  Her blood pressure has been controlled at home, today their BP is BP: (!) 92/50 BP Readings from Last 3 Encounters:  01/25/21 (!) 92/50  10/22/20 106/66  09/27/20 118/76     BMI is Body mass index is 23.65 kg/m., she is working on diet and exercise. Wt Readings from Last 3 Encounters:  01/25/21 142 lb 1.6 oz (64.5 kg)  10/22/20 142 lb 6.4 oz (64.6 kg)  09/25/20 142 lb 6.4 oz (64.6 kg)   She has been noticing spontaneous bruising of her legs as well as fatigue x 4 days.    She does workout. She denies chest pain, shortness of breath, dizziness.   She is on cholesterol medication and denies myalgias. Her cholesterol is not at goal. The cholesterol last visit was:   Lab Results  Component Value Date   CHOL 199 10/22/2020   HDL 77 10/22/2020   LDLCALC 109 (H) 10/22/2020   TRIG 52 10/22/2020   CHOLHDL 2.6 10/22/2020  She has been working on diet and exercise for Diabetes she is on bASA she is not on ACE/ARB denies hyperglycemia, nausea, polydipsia, and polyuria.  Last A1C was:  Lab Results  Component Value Date   HGBA1C 5.8 (H) 10/22/2020    Patient is on Vitamin D supplement.   Lab Results  Component Value Date   VD25OH 83.8 07/11/2020       Current Medications:    Current Outpatient Medications (Cardiovascular):    ezetimibe (ZETIA) 10 MG tablet, Take   1 tablet   Daily for Cholesterol   nitroGLYCERIN (NITROSTAT) 0.4 MG SL tablet, See admin instructions.  Current Outpatient Medications (Respiratory):    loratadine (CLARITIN) 10 MG tablet, Take 1 tablet 1-2 times daily   Triamcinolone Acetonide (NASACORT AQ NA), Place into the nose.  Current Outpatient Medications (Analgesics):     aspirin 81 MG tablet, Take 81 mg by mouth daily.   Current Outpatient Medications (Other):    Bacillus Coagulans-Inulin (ALIGN PREBIOTIC-PROBIOTIC PO), Take 1 tablet by mouth daily.   Cholecalciferol 125 MCG (5000 UT) capsule, Take 5,000 Units by mouth daily.   doxycycline (VIBRAMYCIN) 100 MG capsule, Take 1 capsule (100 mg total) by mouth 2 (two) times daily for 7 days.   famotidine (PEPCID) 40 MG tablet, TAKE 1 TABLET BY MOUTH EVERY DAY IN THE EVENING   Lutein 20 MG TABS, Take by mouth.   Multiple Vitamins-Minerals (AIRBORNE) CHEW, Chew 1 tablet by mouth as needed.   Multiple Vitamins-Minerals (WOMENS MULTI VITAMIN & MINERAL PO), Take 1 tablet by mouth daily.   pantoprazole (PROTONIX) 40 MG tablet, Take by mouth.   Polyethylene Glycol 3350 (MIRALAX PO), Take by mouth at bedtime.   RESTASIS 0.05 % ophthalmic emulsion, instill 1 drop into both eyes twice a day   COVID-19 mRNA Vac-TriS, Pfizer, (PFIZER-BIONT COVID-19 VAC-TRIS) SUSP injection, Inject into the muscle.  Medical History:  Past Medical History:  Diagnosis Date   Arthritis    cervical and lumbar   Cataract    Mixed OU   Depression    GERD (gastroesophageal reflux disease)    Hyperlipidemia    IBS (irritable bowel syndrome)    Osteoporosis    Prediabetes    Vitamin D deficiency    Allergies:  Allergies  Allergen Reactions   Bactrim [Sulfamethoxazole-Trimethoprim] Other (See Comments)    Patient preference to take medication without sulfa   Keflex [Cephalexin]     Rash; chest pain     Review of Systems:  Review of Systems  Constitutional:  Positive for malaise/fatigue. Negative for chills and fever.  HENT:  Positive for congestion and sinus pain. Negative for hearing loss.   Eyes:  Negative for blurred vision and double vision.  Respiratory:  Positive for wheezing. Negative for cough and shortness of breath.   Cardiovascular:  Negative for chest pain, palpitations and leg swelling.  Gastrointestinal:  Negative  for abdominal pain, diarrhea, heartburn, nausea and vomiting.  Genitourinary:  Negative for dysuria and urgency.  Musculoskeletal:  Positive for neck pain. Negative for back pain and joint pain.  Neurological:  Positive for headaches. Negative for dizziness and tremors.  Endo/Heme/Allergies:  Bruises/bleeds easily.  Psychiatric/Behavioral:  Negative for depression.    Family history- Review and unchanged Social history- Review and unchanged Physical Exam: BP (!) 92/50   Pulse (!) 53   Temp (!) 97.2 F (36.2 C)   Wt 142 lb 1.6 oz (64.5 kg)   SpO2 98%   BMI 23.65 kg/m  Wt Readings  from Last 3 Encounters:  01/25/21 142 lb 1.6 oz (64.5 kg)  10/22/20 142 lb 6.4 oz (64.6 kg)  09/25/20 142 lb 6.4 oz (64.6 kg)   General Appearance: Well nourished, in no apparent distress. Eyes: PERRLA, EOMs, conjunctiva no swelling or erythema Sinuses: Frontal sinus tenderness ENT/Mouth: Ext aud canals clear, TMs without erythema, bulging. No erythema, swelling, or exudate on post pharynx.  Tonsils not swollen or erythematous. Hearing normal.  Neck: Supple, thyroid normal.  Respiratory: Respiratory effort normal, BS equal bilaterally without rales, rhonchi, wheezing or stridor.  Cardio: RRR with no MRGs. Brisk peripheral pulses without edema.  Abdomen: Soft, + BS.  Non tender, no guarding, rebound, hernias, masses. Lymphatics: Left submaxillary lymphadenopathy Musculoskeletal: Full ROM, 5/5 strength, Normal gait Skin: Warm, dry . Small bruising on right lower leg Neuro: Cranial nerves intact. No cerebellar symptoms.  Psych: Awake and oriented X 3, normal affect, Insight and Judgment appropriate.    Magda Bernheim, NP 12:02 PM [facility]

## 2021-01-25 ENCOUNTER — Encounter: Payer: Self-pay | Admitting: Nurse Practitioner

## 2021-01-25 ENCOUNTER — Other Ambulatory Visit: Payer: Self-pay

## 2021-01-25 ENCOUNTER — Ambulatory Visit: Payer: Medicare Other | Admitting: Nurse Practitioner

## 2021-01-25 VITALS — BP 92/50 | HR 53 | Temp 97.2°F | Wt 142.1 lb

## 2021-01-25 DIAGNOSIS — J0110 Acute frontal sinusitis, unspecified: Secondary | ICD-10-CM | POA: Diagnosis not present

## 2021-01-25 DIAGNOSIS — M62838 Other muscle spasm: Secondary | ICD-10-CM | POA: Diagnosis not present

## 2021-01-25 DIAGNOSIS — D649 Anemia, unspecified: Secondary | ICD-10-CM | POA: Diagnosis not present

## 2021-01-25 DIAGNOSIS — T148XXA Other injury of unspecified body region, initial encounter: Secondary | ICD-10-CM

## 2021-01-25 DIAGNOSIS — E782 Mixed hyperlipidemia: Secondary | ICD-10-CM

## 2021-01-25 MED ORDER — DOXYCYCLINE HYCLATE 100 MG PO CAPS: 100.0000 mg | ORAL_CAPSULE | Freq: Two times a day (BID) | ORAL | 0 refills | Status: AC

## 2021-01-25 NOTE — Patient Instructions (Addendum)
Sudafed q 8 hours as needed for the next 7 days.   Sinusitis, Adult Sinusitis is inflammation of your sinuses. Sinuses are hollow spaces in the bones around your face. Your sinuses are located: Around your eyes. In the middle of your forehead. Behind your nose. In your cheekbones. Mucus normally drains out of your sinuses. When your nasal tissues become inflamed or swollen, mucus can become trapped or blocked. This allows bacteria, viruses, and fungi to grow, which leads to infection. Most infections of thesinuses are caused by a virus. Sinusitis can develop quickly. It can last for up to 4 weeks (acute) or for more than 12 weeks (chronic). Sinusitis often develops after a cold. What are the causes? This condition is caused by anything that creates swelling in the sinuses or stops mucus from draining. This includes: Allergies. Asthma. Infection from bacteria or viruses. Deformities or blockages in your nose or sinuses. Abnormal growths in the nose (nasal polyps). Pollutants, such as chemicals or irritants in the air. Infection from fungi (rare). What increases the risk? You are more likely to develop this condition if you: Have a weak body defense system (immune system). Do a lot of swimming or diving. Overuse nasal sprays. Smoke. What are the signs or symptoms? The main symptoms of this condition are pain and a feeling of pressure around the affected sinuses. Other symptoms include: Stuffy nose or congestion. Thick drainage from your nose. Swelling and warmth over the affected sinuses. Headache. Upper toothache. A cough that may get worse at night. Extra mucus that collects in the throat or the back of the nose (postnasal drip). Decreased sense of smell and taste. Fatigue. A fever. Sore throat. Bad breath. How is this diagnosed? This condition is diagnosed based on: Your symptoms. Your medical history. A physical exam. Tests to find out if your condition is acute or  chronic. This may include: Checking your nose for nasal polyps. Viewing your sinuses using a device that has a light (endoscope). Testing for allergies or bacteria. Imaging tests, such as an MRI or CT scan. In rare cases, a bone biopsy may be done to rule out more serious types offungal sinus disease. How is this treated? Treatment for sinusitis depends on the cause and whether your condition is chronic or acute. If caused by a virus, your symptoms should go away on their own within 10 days. You may be given medicines to relieve symptoms. They include: Medicines that shrink swollen nasal passages (topical intranasal decongestants). Medicines that treat allergies (antihistamines). A spray that eases inflammation of the nostrils (topical intranasal corticosteroids). Rinses that help get rid of thick mucus in your nose (nasal saline washes). If caused by bacteria, your health care provider may recommend waiting to see if your symptoms improve. Most bacterial infections will get better without antibiotic medicine. You may be given antibiotics if you have: A severe infection. A weak immune system. If caused by narrow nasal passages or nasal polyps, you may need to have surgery. Follow these instructions at home: Medicines Take, use, or apply over-the-counter and prescription medicines only as told by your health care provider. These may include nasal sprays. If you were prescribed an antibiotic medicine, take it as told by your health care provider. Do not stop taking the antibiotic even if you start to feel better. Hydrate and humidify  Drink enough fluid to keep your urine pale yellow. Staying hydrated will help to thin your mucus. Use a cool mist humidifier to keep the humidity level in  your home above 50%. Inhale steam for 10-15 minutes, 3-4 times a day, or as told by your health care provider. You can do this in the bathroom while a hot shower is running. Limit your exposure to cool or dry  air.  Rest Rest as much as possible. Sleep with your head raised (elevated). Make sure you get enough sleep each night. General instructions  Apply a warm, moist washcloth to your face 3-4 times a day or as told by your health care provider. This will help with discomfort. Wash your hands often with soap and water to reduce your exposure to germs. If soap and water are not available, use hand sanitizer. Do not smoke. Avoid being around people who are smoking (secondhand smoke). Keep all follow-up visits as told by your health care provider. This is important.  Contact a health care provider if: You have a fever. Your symptoms get worse. Your symptoms do not improve within 10 days. Get help right away if: You have a severe headache. You have persistent vomiting. You have severe pain or swelling around your face or eyes. You have vision problems. You develop confusion. Your neck is stiff. You have trouble breathing. Summary Sinusitis is soreness and inflammation of your sinuses. Sinuses are hollow spaces in the bones around your face. This condition is caused by nasal tissues that become inflamed or swollen. The swelling traps or blocks the flow of mucus. This allows bacteria, viruses, and fungi to grow, which leads to infection. If you were prescribed an antibiotic medicine, take it as told by your health care provider. Do not stop taking the antibiotic even if you start to feel better. Keep all follow-up visits as told by your health care provider. This is important. This information is not intended to replace advice given to you by your health care provider. Make sure you discuss any questions you have with your healthcare provider. Document Revised: 11/02/2017 Document Reviewed: 11/02/2017 Elsevier Patient Education  2022 [employer].

## 2021-01-26 LAB — CBC WITH DIFFERENTIAL/PLATELET
Absolute Monocytes: 260 cells/uL (ref 200–950)
Basophils Absolute: 11 cells/uL (ref 0–200)
Basophils Relative: 0.4 %
Eosinophils Absolute: 39 cells/uL (ref 15–500)
Eosinophils Relative: 1.4 %
HCT: 38.8 % (ref 35.0–45.0)
Hemoglobin: 12.6 g/dL (ref 11.7–15.5)
Lymphs Abs: 1456 cells/uL (ref 850–3900)
MCH: 28.4 pg (ref 27.0–33.0)
MCHC: 32.5 g/dL (ref 32.0–36.0)
MCV: 87.4 fL (ref 80.0–100.0)
MPV: 12.3 fL (ref 7.5–12.5)
Monocytes Relative: 9.3 %
Neutro Abs: 1033 cells/uL — ABNORMAL LOW (ref 1500–7800)
Neutrophils Relative %: 36.9 %
Platelets: 147 10*3/uL (ref 140–400)
RBC: 4.44 10*6/uL (ref 3.80–5.10)
RDW: 13 % (ref 11.0–15.0)
Total Lymphocyte: 52 %
WBC: 2.8 10*3/uL — ABNORMAL LOW (ref 3.8–10.8)

## 2021-01-26 LAB — IRON, TOTAL/TOTAL IRON BINDING CAP
%SAT: 42 % (calc) (ref 16–45)
Iron: 129 ug/dL (ref 45–160)
TIBC: 309 mcg/dL (calc) (ref 250–450)

## 2021-01-26 LAB — COMPLETE METABOLIC PANEL WITH GFR
AG Ratio: 1.6 (calc) (ref 1.0–2.5)
ALT: 13 U/L (ref 6–29)
AST: 16 U/L (ref 10–35)
Albumin: 4.2 g/dL (ref 3.6–5.1)
Alkaline phosphatase (APISO): 71 U/L (ref 37–153)
BUN: 12 mg/dL (ref 7–25)
CO2: 30 mmol/L (ref 20–32)
Calcium: 9.6 mg/dL (ref 8.6–10.4)
Chloride: 105 mmol/L (ref 98–110)
Creat: 0.74 mg/dL (ref 0.50–1.05)
Globulin: 2.6 g/dL (calc) (ref 1.9–3.7)
Glucose, Bld: 74 mg/dL (ref 65–99)
Potassium: 4.5 mmol/L (ref 3.5–5.3)
Sodium: 139 mmol/L (ref 135–146)
Total Bilirubin: 0.5 mg/dL (ref 0.2–1.2)
Total Protein: 6.8 g/dL (ref 6.1–8.1)
eGFR: 88 mL/min/{1.73_m2} (ref >=60)

## 2021-01-26 LAB — FERRITIN: Ferritin: 29 ng/mL (ref 16–288)

## 2021-01-26 LAB — TSH: TSH: 0.97 mIU/L (ref 0.40–4.50)

## 2021-02-17 ENCOUNTER — Encounter (HOSPITAL_BASED_OUTPATIENT_CLINIC_OR_DEPARTMENT_OTHER): Payer: Self-pay

## 2021-02-17 ENCOUNTER — Emergency Department (HOSPITAL_BASED_OUTPATIENT_CLINIC_OR_DEPARTMENT_OTHER)
Admission: EM | Admit: 2021-02-17 | Discharge: 2021-02-17 | Disposition: A | Discharge status: Home / Self Care | Payer: Medicare Other | Attending: Emergency Medicine | Admitting: Emergency Medicine

## 2021-02-17 ENCOUNTER — Emergency Department (HOSPITAL_BASED_OUTPATIENT_CLINIC_OR_DEPARTMENT_OTHER): Payer: Medicare Other | Admitting: Radiology

## 2021-02-17 ENCOUNTER — Other Ambulatory Visit: Payer: Self-pay

## 2021-02-17 DIAGNOSIS — Z7982 Long term (current) use of aspirin: Secondary | ICD-10-CM | POA: Insufficient documentation

## 2021-02-17 DIAGNOSIS — R0789 Other chest pain: Secondary | ICD-10-CM

## 2021-02-17 DIAGNOSIS — R079 Chest pain, unspecified: Secondary | ICD-10-CM | POA: Diagnosis present

## 2021-02-17 LAB — CBC WITH DIFFERENTIAL/PLATELET
Abs Immature Granulocytes: 0 10*3/uL (ref 0.00–0.07)
Basophils Absolute: 0 10*3/uL (ref 0.0–0.1)
Basophils Relative: 1 %
Eosinophils Absolute: 0.1 10*3/uL (ref 0.0–0.5)
Eosinophils Relative: 3 %
HCT: 37.1 % (ref 36.0–46.0)
Hemoglobin: 12.4 g/dL (ref 12.0–15.0)
Immature Granulocytes: 0 %
Lymphocytes Relative: 55 %
Lymphs Abs: 2 10*3/uL (ref 0.7–4.0)
MCH: 28.8 pg (ref 26.0–34.0)
MCHC: 33.4 g/dL (ref 30.0–36.0)
MCV: 86.1 fL (ref 80.0–100.0)
Monocytes Absolute: 0.4 10*3/uL (ref 0.1–1.0)
Monocytes Relative: 11 %
Neutro Abs: 1.1 10*3/uL — ABNORMAL LOW (ref 1.7–7.7)
Neutrophils Relative %: 30 %
Platelets: 156 10*3/uL (ref 150–400)
RBC: 4.31 MIL/uL (ref 3.87–5.11)
RDW: 12.9 % (ref 11.5–15.5)
WBC: 3.5 10*3/uL — ABNORMAL LOW (ref 4.0–10.5)
nRBC: 0 % (ref 0.0–0.2)

## 2021-02-17 LAB — BASIC METABOLIC PANEL
Anion gap: 7 (ref 5–15)
BUN: 16 mg/dL (ref 8–23)
CO2: 26 mmol/L (ref 22–32)
Calcium: 9.2 mg/dL (ref 8.9–10.3)
Chloride: 104 mmol/L (ref 98–111)
Creatinine, Ser: 0.81 mg/dL (ref 0.44–1.00)
GFR, Estimated: >60 mL/min (ref >60)
Glucose, Bld: 102 mg/dL — ABNORMAL HIGH (ref 70–99)
Potassium: 4.1 mmol/L (ref 3.5–5.1)
Sodium: 137 mmol/L (ref 135–145)

## 2021-02-17 LAB — D-DIMER, QUANTITATIVE: D-Dimer, Quant: <0.27 ug/mL-FEU (ref 0.00–0.50)

## 2021-02-17 LAB — TROPONIN I (HIGH SENSITIVITY)
Troponin I (High Sensitivity): <2 ng/L (ref <18)
Troponin I (High Sensitivity): <2 ng/L (ref <18)

## 2021-02-17 MED ORDER — ASPIRIN 81 MG PO CHEW
324.0000 mg | CHEWABLE_TABLET | Freq: Once | ORAL | Status: AC
  Administered 2021-02-17: 324 mg via ORAL
  Filled 2021-02-17: qty 4

## 2021-02-17 NOTE — ED Provider Notes (Signed)
Care is taken over from Dr. Florina Ou.  Patient presented with some sharp left-sided chest pain.  Its in her left mid chest.  There is no abdominal pain.  No current associated back pain.  No leg swelling.  No hypoxia.  Her troponins are negative.  Her D-dimer is negative with no hypoxia or other suggestions of PE.  Her blood pressure is a little bit low in the 90s but on chart review this seems to be baseline for her.  Her pain has essentially resolved at this point.  She has no shortness of breath or other associated symptoms.  No exertional symptoms.  She was discharged home in good condition.  She was encouraged to follow-up with her PCP this week.  Return precautions were given.   Malvin Johns, MD 02/17/21 [phone]

## 2021-02-17 NOTE — ED Notes (Signed)
Coffee given

## 2021-02-17 NOTE — ED Triage Notes (Addendum)
Pt was woken up this morning with sharp, left sided flank pain that started 20-25 minutes PTA. Pt also c/o slight nausea. Pain does not radiate and taking a deep breath makes it worse. Denies SOB or breaking out into a sweat. Pt states the pain has decreased since arrival.

## 2021-02-17 NOTE — ED Notes (Signed)
Patient returned from X-ray 

## 2021-02-17 NOTE — ED Provider Notes (Signed)
[facility] Provider Note: Georgena Spurling, MD, FACEP  CSN: [id number] MRN: [id number] ARRIVAL: 02/17/21 at [place]: [id number]   CHIEF COMPLAINT  Chest Pain   HISTORY OF PRESENT ILLNESS  02/17/21 4:57 AM [name] Standard is a 70 y.o. female who was in bed about 25 minutes prior to arrival when she had the fairly sudden onset of chest pain.  The chest pain was located on the left side of her chest beginning in the lateral aspect of her left submammary fold around to her left side.  She rates it as a 10 out of 10 at its worst but it has completely resolved now.  When it was present it was worse with a deep breath and she was not able to take a deep breath.  She denies diaphoresis or nausea with it.  She has a history of shingles several years ago but it was in a dermatome lower than the location of this morning's pain.   Past Medical History:  Diagnosis Date   Arthritis    cervical and lumbar   Cataract    Mixed OU   Depression    GERD (gastroesophageal reflux disease)    Hyperlipidemia    IBS (irritable bowel syndrome)    Osteoporosis    Prediabetes    Vitamin D deficiency     Past Surgical History:  Procedure Laterality Date   CARDIAC CATHETERIZATION  2011   normal (Dr. Terrence Dupont)   RADIOACTIVE SEED GUIDED EXCISIONAL BREAST BIOPSY Right 03/11/2016   Procedure: RIGHT RADIOACTIVE SEED GUIDED EXCISIONAL BREAST BIOPSY;  Surgeon: Rolm Bookbinder, MD;  Location: [facility];  Service: General;  Laterality: Right;    Family History  Problem Relation Age of Onset   Hypertension Mother    Other Mother        surgeries involving upper back   Diabetes Father    Heart failure Father    Cancer Maternal Grandmother        Lung   Allergic rhinitis Neg Hx    Angioedema Neg Hx    Asthma Neg Hx    Atopy Neg Hx    Eczema Neg Hx    Immunodeficiency Neg Hx    Urticaria Neg Hx     Social History   Tobacco Use   Smoking status: Never   Smokeless  tobacco: Never  Vaping Use   Vaping Use: Never used  Substance Use Topics   Alcohol use: Not Currently   Drug use: No    Prior to Admission medications   Medication Sig Start Date End Date Taking? Authorizing Provider  aspirin 81 MG tablet Take 81 mg by mouth daily.    [provider]  Bacillus Coagulans-Inulin (ALIGN PREBIOTIC-PROBIOTIC PO) Take 1 tablet by mouth daily.    [provider]  Cholecalciferol 125 MCG (5000 UT) capsule Take 5,000 Units by mouth daily.    [provider]  ezetimibe (ZETIA) 10 MG tablet Take   1 tablet   Daily for Cholesterol 07/09/20   Unk Pinto, MD  famotidine (PEPCID) 40 MG tablet TAKE 1 TABLET BY MOUTH EVERY DAY IN THE EVENING 09/10/20   Kozlow, Donnamarie Poag, MD  loratadine (CLARITIN) 10 MG tablet Take 1 tablet 1-2 times daily 08/21/20   Kozlow, Donnamarie Poag, MD  Lutein 20 MG TABS Take by mouth.    [provider]  Multiple Vitamins-Minerals (AIRBORNE) CHEW Chew 1 tablet by mouth as needed.    [provider]  Multiple Vitamins-Minerals (  WOMENS MULTI VITAMIN & MINERAL PO) Take 1 tablet by mouth daily.    [provider]  nitroGLYCERIN (NITROSTAT) 0.4 MG SL tablet See admin instructions. 08/28/20   [provider]  pantoprazole (PROTONIX) 40 MG tablet Take by mouth. 10/23/20   [provider]  Polyethylene Glycol 3350 (MIRALAX PO) Take by mouth at bedtime.    [provider]  RESTASIS 0.05 % ophthalmic emulsion instill 1 drop into both eyes twice a day 11/08/14   [provider]  Triamcinolone Acetonide (NASACORT AQ NA) Place into the nose.    [provider]    Allergies Bactrim [sulfamethoxazole-trimethoprim] and Keflex [cephalexin]   REVIEW OF SYSTEMS  Negative except as noted here or in the History of Present Illness.   PHYSICAL EXAMINATION  Initial Vital Signs Blood pressure 121/71, pulse 69, temperature 98.1 F (36.7 C), temperature source Oral, resp. rate  12, height '5\' 4"'$  (1.626 m), weight 64.9 kg, SpO2 100 %.  Examination General: Well-developed, well-nourished female in no acute distress; appearance consistent with age of record HENT: normocephalic; atraumatic Eyes: pupils equal, round and reactive to light; extraocular muscles intact Neck: supple Heart: regular rate and rhythm; no murmur Lungs: clear to auscultation bilaterally Chest: No hyperesthesia, tenderness or rash in the location of her reported pain Abdomen: soft; nondistended; nontender; no masses or hepatosplenomegaly; bowel sounds present Extremities: No deformity; full range of motion; pulses normal Neurologic: Awake, alert and oriented; motor function intact in all extremities and symmetric; no facial droop Skin: Warm and dry Psychiatric: Normal mood and affect   RESULTS  Summary of this visit's results, reviewed and interpreted by myself:   EKG Interpretation  Date/Time:  Sunday February 17 2021 04:52:23 EDT Ventricular Rate:  66 PR Interval:  152 QRS Duration: 87 QT Interval:  406 QTC Calculation: 426 R Axis:   61 Text Interpretation: Sinus rhythm Normal ECG No significant change was found Confirmed by Shanon Rosser [phone]) on 02/17/2021 4:59:40 AM       Laboratory Studies: Results for orders placed or performed during the hospital encounter of 02/17/21 (from the past 24 hour(s))  CBC with Differential/Platelet     Status: Abnormal   Collection Time: 02/17/21  5:04 AM  Result Value Ref Range   WBC 3.5 (L) 4.0 - 10.5 K/uL   RBC 4.31 3.87 - 5.11 MIL/uL   Hemoglobin 12.4 12.0 - 15.0 g/dL   HCT 37.1 36.0 - 46.0 %   MCV 86.1 80.0 - 100.0 fL   MCH 28.8 26.0 - 34.0 pg   MCHC 33.4 30.0 - 36.0 g/dL   RDW 12.9 11.5 - 15.5 %   Platelets 156 150 - 400 K/uL   nRBC 0.0 0.0 - 0.2 %   Neutrophils Relative % 30 %   Neutro Abs 1.1 (L) 1.7 - 7.7 K/uL   Lymphocytes Relative 55 %   Lymphs Abs 2.0 0.7 - 4.0 K/uL   Monocytes Relative 11 %   Monocytes Absolute 0.4 0.1 -  1.0 K/uL   Eosinophils Relative 3 %   Eosinophils Absolute 0.1 0.0 - 0.5 K/uL   Basophils Relative 1 %   Basophils Absolute 0.0 0.0 - 0.1 K/uL   Immature Granulocytes 0 %   Abs Immature Granulocytes 0.00 0.00 - 0.07 K/uL  Basic metabolic panel     Status: Abnormal   Collection Time: 02/17/21  5:04 AM  Result Value Ref Range   Sodium 137 135 - 145 mmol/L   Potassium 4.1 3.5 - 5.1 mmol/L  Chloride 104 98 - 111 mmol/L   CO2 26 22 - 32 mmol/L   Glucose, Bld 102 (H) 70 - 99 mg/dL   BUN 16 8 - 23 mg/dL   Creatinine, Ser 0.81 0.44 - 1.00 mg/dL   Calcium 9.2 8.9 - 10.3 mg/dL   GFR, Estimated >60 >60 mL/min   Anion gap 7 5 - 15  Troponin I (High Sensitivity)     Status: None   Collection Time: 02/17/21  5:04 AM  Result Value Ref Range   Troponin I (High Sensitivity) <2 <18 ng/L  D-dimer, quantitative     Status: None   Collection Time: 02/17/21  5:04 AM  Result Value Ref Range   D-Dimer, Quant <0.27 0.00 - 0.50 ug/mL-FEU  Troponin I (High Sensitivity)     Status: None   Collection Time: 02/17/21  6:40 AM  Result Value Ref Range   Troponin I (High Sensitivity) <2 <18 ng/L   Imaging Studies: DG Chest 2 View  Result Date: 02/17/2021 CLINICAL DATA:  Chest and left flank pain EXAM: CHEST - 2 VIEW COMPARISON:  01/27/2020 chest radiograph. FINDINGS: Stable cardiomediastinal silhouette with normal heart size. No pneumothorax. No pleural effusion. Lungs appear clear, with no acute consolidative airspace disease and no pulmonary edema. IMPRESSION: No active cardiopulmonary disease. Electronically Signed   By: Ilona Sorrel M.D.   On: 02/17/2021 05:41    ED COURSE and MDM  Nursing notes, initial and subsequent vitals signs, including pulse oximetry, reviewed and interpreted by myself.  Vitals:   02/17/21 0645 02/17/21 0652 02/17/21 0700 02/17/21 0847  BP:  99/62 (!) 99/54 98/66  Pulse: (!) 55 (!) 48 (!) 48 65  Resp: '18 12 13 18  '$ Temp:      TempSrc:      SpO2: 99% 100% 100% 100%  Weight:       Height:       Medications  aspirin chewable tablet 324 mg (324 mg Oral Given 02/17/21 0508)   7:01 AM Second troponin pending. Signed out to Dr. Tamera Punt.   PROCEDURES  Procedures   ED DIAGNOSES     ICD-10-CM   1. Atypical chest pain  R07.89          Shanon Rosser, MD 02/17/21 2245

## 2021-03-08 LAB — HM MAMMOGRAPHY

## 2021-03-20 ENCOUNTER — Encounter: Payer: Self-pay | Admitting: Internal Medicine

## 2021-04-02 ENCOUNTER — Ambulatory Visit: Payer: Medicare Other | Admitting: Allergy and Immunology

## 2021-04-02 ENCOUNTER — Encounter: Payer: Self-pay | Admitting: Internal Medicine

## 2021-04-02 ENCOUNTER — Other Ambulatory Visit: Payer: Self-pay

## 2021-04-02 VITALS — BP 102/68 | HR 59 | Temp 98.0°F | Resp 14 | Ht 65.0 in | Wt 142.6 lb

## 2021-04-02 DIAGNOSIS — J3089 Other allergic rhinitis: Secondary | ICD-10-CM | POA: Diagnosis not present

## 2021-04-02 DIAGNOSIS — K219 Gastro-esophageal reflux disease without esophagitis: Secondary | ICD-10-CM | POA: Diagnosis not present

## 2021-04-02 DIAGNOSIS — G43909 Migraine, unspecified, not intractable, without status migrainosus: Secondary | ICD-10-CM

## 2021-04-02 DIAGNOSIS — G4720 Circadian rhythm sleep disorder, unspecified type: Secondary | ICD-10-CM

## 2021-04-02 MED ORDER — PANTOPRAZOLE SODIUM 40 MG PO TBEC: 40.0000 mg | DELAYED_RELEASE_TABLET | Freq: Every morning | ORAL | 5 refills | Status: DC

## 2021-04-02 MED ORDER — FAMOTIDINE 40 MG PO TABS: ORAL_TABLET | ORAL | 5 refills | Status: DC

## 2021-04-02 MED ORDER — LORATADINE 10 MG PO TABS
ORAL_TABLET | ORAL | 5 refills | Status: DC
Start: 2021-04-02 — End: 2021-10-08

## 2021-04-02 MED ORDER — TRIAMCINOLONE ACETONIDE 55 MCG/ACT NA AERO: INHALATION_SPRAY | NASAL | 5 refills | Status: DC

## 2021-04-02 NOTE — Progress Notes (Signed)
Annual Screening/Preventative Visit & Comprehensive Evaluation &  Examination  Future Appointments  Date Time Provider [place]  04/03/2021       - CPE 10:00 AM Unk Pinto, MD [facility] None  04/23/2021  3:00 PM Salvadore Dom, MD [facility] None  07/17/2021  9:00 AM Philemon Kingdom, MD [facility] None  10/08/2021 11:10 AM Neldon Mc, Donnamarie Poag, MD [facility] None  04/03/2022       - CPE  10:00 AM Unk Pinto, MD [facility] None        This very nice 70 y.o. MBF  presents for a Screening /Preventative Visit & comprehensive evaluation and management of multiple medical co-morbidities.  Patient has been followed for HTN, HLD, Prediabetes  and Vitamin D Deficiency.  Patient is followed by Dr Earlean Shawl for IBS-C & GERD. She has been seen by Dr's Wert & Kozlow for upper airway cough syndrome and recommended omeprazole 40 mg in AM, famotidine 40 mg in PM. Patient is also followed by Dr Cruzita Lederer for Osteoporosis.   Patient has hx/o Osteoporosis by de[facility] (T-3.6 - 2019)  and declines taking meds.        Patient has been followed for labile HTN.   Patient's BP has been controlled at home .  She had a Nl  heart echo & Heart cath in 2011 (Dr Terrence Dupont).  Negative Myoview 08/29/2020 - Dr Terrence Dupont.  Patient denies any cardiac symptoms as chest pain, palpitations, shortness of breath or ankle swelling. She does report occasional postural lightheadedness  and also reports occasional hrs are measured in the 30's &40's. Today's BP is borderline low - 100/60.  A few times her pulse was reported at 35 & 40 bpm.        Patient's hyperlipidemia is not controlled with diet and Ezetimibe and she is reticent to take statins.   Patient denies myalgias or other medication SE's. Last lipids were not at goal :  Lab Results  Component Value Date   CHOL 199 10/22/2020   HDL 77 10/22/2020   LDLCALC 109 (H) 10/22/2020   TRIG 52 10/22/2020   CHOLHDL 2.6 10/22/2020         Patient has hx/o prediabetes predating  (A1c 5.7% /2014 & 6.1% /2016) and patient denies reactive hypoglycemic symptoms, visual blurring, diabetic polys or paresthesias. Last A1c was near goal :  Lab Results  Component Value Date   HGBA1C 5.8 (H) 10/22/2020         Finally, patient has history of Vitamin D Deficiency and last Vitamin D was at goal :  Lab Results  Component Value Date   VD25OH 83.8 07/11/2020     Current Outpatient Medications on File Prior to Visit  Medication Sig   aspirin 81 MG tablet Take daily.   Bacillus Coagulans-Inulin (ALIGN PREBIOTIC-PROBIOTIC ) Take 1 tablet daily.   Calcium 1200)-Vit D-Min  Take by mouth.   Vit D 5000 u Take 5,000 Units by mouth daily.   ezetimibe (ZETIA) 10 MG tablet Take   1 tablet   Daily for Cholesterol   famotidine (PEPCID) 40 MG tablet TAKE 1 TABLET BY MOUTH EVERY DAY IN THE EVENING   loratadine (CLARITIN) 10 MG tablet Take 1 tablet 1-2 times daily   Lutein 20 MG TABS Take by mouth.   Multiple Vitamins-Minerals (AIRBORNE)  Chew 1 tablet by mouth as needed.   pantoprazole 40 MG tablet Take 1 tablet (40 mg total) by mouth in the morning.   Polyethylene Glycol  Take by mouth at bedtime.  RESTASIS 0.05 % ophthalmic emulsion instill 1 drop into both eyes twice a day   NASACORT  nasal inhaler 1-2 sprays per nostril 1-7 times per week.     Allergies  Allergen Reactions   Bactrim [Sulfamethoxazole-Trimethoprim] Other (See Comments)    Patient preference to take medication without sulfa   Cephalexin Nausea And Vomiting    Rash; chest pain     Past Medical History:  Diagnosis Date   Arthritis    cervical and lumbar   Cataract    Mixed OU   Depression    GERD (gastroesophageal reflux disease)    Hyperlipidemia    IBS (irritable bowel syndrome)    Osteoporosis    Prediabetes    Vitamin D deficiency      Health Maintenance  Topic Date Due   Zoster Vaccines- Shingrix (1 of 2) Never done   INFLUENZA VACCINE  01/14/2021   COVID-19 Vaccine (5 - Booster for  [place] series) 04/28/2021   MAMMOGRAM  03/08/2022   COLONOSCOPY (Pts 45-43yrs Insurance coverage will need to be confirmed)  10/17/2028   TETANUS/TDAP  03/28/2029   DEXA SCAN  Completed   Hepatitis C Screening  Completed   HPV VACCINES  Aged Out     Immunization History  Administered Date(s) Administered   Influenza, High Dose  06/25/2017   Influenz 03/24/2018, 03/29/2019, 03/05/2020   PFIZER Covid-19 Tri-Sucrose Vacc 12/26/2020   PFIZER SARS-COV-2 Vacc 07/08/2019, 07/29/2019, 03/20/2020   PPD Test 12/12/2014, 12/26/2015, 01/15/2017   Pneumococcal -13 11/10/2018   Td 05/18/2007   Zoster, Live 05/17/2006     Last Colon - 10/18/2018 - Normal - Dr Earlean Shawl - Recc 10 yr f/u - due May 2030   Last MGM - 03/08/2021   Past Surgical History:  Procedure Laterality Date   CARDIAC CATHETERIZATION  2011   normal (Dr. Terrence Dupont)   RADIOACTIVE SEED GUIDED EXCISIONAL BREAST BIOPSY Right 03/11/2016   Procedure: RIGHT RADIOACTIVE SEED GUIDED EXCISIONAL BREAST BIOPSY;  Surgeon: Rolm Bookbinder, MD;  Location: [facility];  Service: General;  Laterality: Right;     Family History  Problem Relation Age of Onset   Hypertension Mother    Other Mother        surgeries involving upper back   Diabetes Father    Heart failure Father    Cancer Maternal Grandmother        Lung   Allergic rhinitis Neg Hx    Angioedema Neg Hx    Asthma Neg Hx    Atopy Neg Hx    Eczema Neg Hx    Immunodeficiency Neg Hx    Urticaria Neg Hx      Social History   Tobacco Use   Smoking status: Never   Smokeless tobacco: Never  Vaping Use   Vaping Use: Never used  Substance Use Topics   Alcohol use: Not Currently   Drug use: No      ROS Constitutional: Denies fever, chills, weight loss/gain, headaches, insomnia,  night sweats, and change in appetite. Does c/o fatigue. Eyes: Denies redness, blurred vision, diplopia, discharge, itchy, watery eyes.  ENT: Denies discharge, congestion, post  nasal drip, epistaxis, sore throat, earache, hearing loss, dental pain, Tinnitus, Vertigo, Sinus pain, snoring.  Cardio: Denies chest pain, palpitations, irregular heartbeat, syncope, dyspnea, diaphoresis, orthopnea, PND, claudication, edema Respiratory: denies cough, dyspnea, DOE, pleurisy, hoarseness, laryngitis, wheezing.  Gastrointestinal: Denies dysphagia, heartburn, reflux, water brash, pain, cramps, nausea, vomiting, bloating, diarrhea, constipation, hematemesis, melena, hematochezia, jaundice, hemorrhoids Genitourinary: Denies dysuria,  frequency, urgency, nocturia, hesitancy, discharge, hematuria, flank pain Breast: Breast lumps, nipple discharge, bleeding.  Musculoskeletal: Denies arthralgia, myalgia, stiffness, Jt. Swelling, pain, limp, and strain/sprain. Denies falls. Skin: Denies puritis, rash, hives, warts, acne, eczema, changing in skin lesion Neuro: No weakness, tremor, incoordination, spasms, paresthesia, pain Psychiatric: Denies confusion, memory loss, sensory loss. Denies Depression. Endocrine: Denies change in weight, skin, hair change, nocturia, and paresthesia, diabetic polys, visual blurring, hyper / hypo glycemic episodes.  Heme/Lymph: No excessive bleeding, bruising, enlarged lymph nodes.  Physical Exam  BP 100/60   Pulse (!) 41   Temp 97.8 F (36.6 C)   Resp 16   Ht 5\' 5"  (1.651 m)   Wt 143 lb 6.4 oz (65 kg)   SpO2 99%   BMI 23.86 kg/m   General Appearance: Well nourished, well groomed and in no apparent distress.  Eyes: PERRLA, EOMs, conjunctiva no swelling or erythema, normal fundi and vessels. Sinuses: No frontal/maxillary tenderness ENT/Mouth: EACs patent / TMs  nl. Nares clear without erythema, swelling, mucoid exudates. Oral hygiene is good. No erythema, swelling, or exudate. Tongue normal, non-obstructing. Tonsils not swollen or erythematous. Hearing normal.  Neck: Supple, thyroid not palpable. No bruits, nodes or JVD. Respiratory: Respiratory effort  normal.  BS equal and clear bilateral without rales, rhonci, wheezing or stridor. Cardio: Heart sounds are normal with regular rate and rhythm and no murmurs, rubs or gallops. Peripheral pulses are normal and equal bilaterally without edema. No aortic or femoral bruits. Chest: symmetric with normal excursions and percussion. Breasts: Symmetric, without lumps, nipple discharge, retractions, or fibrocystic changes.  Abdomen: Flat, soft with bowel sounds active. Nontender, no guarding, rebound, hernias, masses, or organomegaly.  Lymphatics: Non tender without lymphadenopathy.  Genitourinary:  Musculoskeletal: Full ROM all peripheral extremities, joint stability, 5/5 strength, and normal gait. Skin: Warm and dry without rashes, lesions, cyanosis, clubbing or  ecchymosis.  Neuro: Cranial nerves intact, reflexes equal bilaterally. Normal muscle tone, no cerebellar symptoms. Sensation intact.  Pysch: Alert and oriented X 3, normal affect, Insight and Judgment appropriate.    Assessment and Plan  1. Annual Preventative Screening Examination   1. Encounter for general adult medical examination with abnormal findings   2. Labile hypertension  - EKG 12-Lead - Urinalysis, Routine w reflex microscopic - Microalbumin / creatinine urine ratio - CBC with Differential/Platelet - COMPLETE METABOLIC PANEL WITH GFR - Magnesium - TSH  3. Hyperlipidemia, mixed  - EKG 12-Lead - Lipid panel - TSH  4. Abnormal glucose  - EKG 12-Lead - Hemoglobin A1c - Insulin, random  5. Vitamin D deficiency  - VITAMIN D 25 Hydroxy   6. Gastroesophageal reflux disease  - CBC with Differential/Platelet  7. Irritable bowel syndrome with constipation   8. Osteoporosis without current pathological fracture  - COMPLETE METABOLIC PANEL WITH GFR  9. Upper airway cough syndrome   10. Screening for colorectal cancer  - POC Hemoccult Bld/Stl  11. Screening for ischemic heart disease  - EKG  12-Lead  12. FHx: heart disease  - EKG 12-Lead  13. Fear of side effects of medication   14. Medication management  - Urinalysis, Routine w reflex microscopic - Microalbumin / creatinine urine ratio  15. Postural dizziness with presyncope  - Ambulatory referral to Cardiology  16. Sick sinus syndrome ([place]) - rule out   - Ambulatory referral to Cardiology          Patient was counseled in prudent diet to achieve/maintain BMI less than 25 for weight control, BP monitoring, regular  exercise and medications. Discussed med's effects and SE's. Screening labs and tests as requested with regular follow-up as recommended. Over 40 minutes of exam, counseling, chart review and high complex critical decision making was performed.   Kirtland Bouchard, MD

## 2021-04-02 NOTE — Patient Instructions (Addendum)
  1.  Allergen avoidance measures - dust mite, pollens, molds, cat  2.  Continue to treat and prevent inflammation:   A. Nasacort - 1-2 sprays each nostril 1-7 times per week  3.  Continue to Treat and prevent reflux:   A.  Pantoprazole 40 mg in AM  B.  famotidine 40 mg in evening  4. If needed:   A. Loratadine 10 mg - 1 tablet 1-2 times per day  5.  Treat and prevent headache/sleep dysfunction:   A. Modify TV behavior  B. White noise during sleep time  C. Further treatment???    6. Return to clinic in 6 months or earlier if problem.

## 2021-04-02 NOTE — Patient Instructions (Signed)

## 2021-04-02 NOTE — Progress Notes (Signed)
[facility]   Follow-up Note  Referring Provider: Unk Pinto, MD Primary Provider: Unk Pinto, MD Date of Office Visit: 04/02/2021  Subjective:   [name] (DOB: 01/06/1951) is a 70 y.o. female who returns to the [facility] on 04/02/2021 in re-evaluation of the following:  HPI: [name] returns to this clinic in evaluation of allergic rhinitis, LPR, migraine syndrome, and sleep dysfunction.  Her last visit to this clinic was 25 September 2020.  Overall Tanashia is done relatively well with her upper airway while rarely using any Nasacort and occasionally some antihistamines.  She has not required a systemic steroid or antibiotic since her last visit for an airway issue.  She has had return of her headaches.  She has a long history of headaches and they returned in July 2022.  She has pretty much a daily headache.  As well, she still has very significant sleep dysfunction.  She falls asleep about 9 PM with the TV on and then she wakes up at around 1030 then she watches a little bit more TV.  At that point in time her brain is wide-awake.  Then she goes to bed and she has had fractured sleep throughout the night.  She never really gets into a deep sleep and she can easily be woken up with any type of sound.  She does not have any breathing issues at nighttime.  She is having little bit of a dry cough as well.  This has also been an intermittent issue that has been present for years.  This issue appeared to be secondary to her LPR.  Currently she is using a combination of proton pump inhibitor and H2 receptor blocker and she thinks for the most part her reflux is under good control although she still has some breakthrough reflux symptoms.  She did see her gastroenterologist regarding this issue but no additional therapy was prescribed.  She apparently sees a cardiologist for some issues with chest pain that occurred in the  past.  Apparently she has been having a low blood pressure and low heart rate and her cardiologist is addressing this issue.  She has received 4 Pfizer vaccines for COVID and has had the flu vaccine this year.  Allergies as of 04/02/2021       Reactions   Bactrim [sulfamethoxazole-trimethoprim] Other (See Comments)   Patient preference to take medication without sulfa   Cephalexin Nausea And Vomiting   Rash; chest pain Rash; chest pain     Medication List    Airborne Chew Chew 1 tablet by mouth as needed.   ALIGN PREBIOTIC-PROBIOTIC PO Take 1 tablet by mouth daily.   aspirin 81 MG tablet Take 81 mg by mouth daily.   CALCIUM 1200 PO Take by mouth.   Cholecalciferol 125 MCG (5000 UT) capsule Take 5,000 Units by mouth daily.   ezetimibe 10 MG tablet Commonly known as: ZETIA Take   1 tablet   Daily for Cholesterol   famotidine 40 MG tablet Commonly known as: PEPCID TAKE 1 TABLET BY MOUTH EVERY DAY IN THE EVENING   loratadine 10 MG tablet Commonly known as: CLARITIN Take 1 tablet 1-2 times daily   Lutein 20 MG Tabs Take by mouth.   MIRALAX PO Take by mouth at bedtime.   NASACORT AQ NA Place into the nose.   pantoprazole 40 MG tablet Commonly known as: PROTONIX Take by mouth.   Restasis 0.05 % ophthalmic  emulsion Generic drug: cycloSPORINE instill 1 drop into both eyes twice a day    Past Medical History:  Diagnosis Date   Arthritis    cervical and lumbar   Cataract    Mixed OU   Depression    GERD (gastroesophageal reflux disease)    Hyperlipidemia    IBS (irritable bowel syndrome)    Osteoporosis    Prediabetes    Vitamin D deficiency     Past Surgical History:  Procedure Laterality Date   CARDIAC CATHETERIZATION  2011   normal (Dr. Terrence Dupont)   RADIOACTIVE SEED GUIDED EXCISIONAL BREAST BIOPSY Right 03/11/2016   Procedure: RIGHT RADIOACTIVE SEED GUIDED EXCISIONAL BREAST BIOPSY;  Surgeon: Rolm Bookbinder, MD;  Location: [place] [facility];  Service: General;  Laterality: Right;    Review of systems negative except as noted in HPI / PMHx or noted below:  Review of Systems  Constitutional: Negative.   HENT: Negative.    Eyes: Negative.   Respiratory: Negative.    Cardiovascular: Negative.   Gastrointestinal: Negative.   Genitourinary: Negative.   Musculoskeletal: Negative.   Skin: Negative.   Neurological: Negative.   Endo/Heme/Allergies: Negative.   Psychiatric/Behavioral: Negative.      Objective:   Vitals:   04/02/21 1146  BP: 102/68  Pulse: (!) 59  Resp: 14  Temp: 98 F (36.7 C)  SpO2: 100%   Height: 5\' 5"  (165.1 cm)  Weight: 142 lb 9.6 oz (64.7 kg)   Physical Exam Constitutional:      Appearance: She is not diaphoretic.  HENT:     Head: Normocephalic.     Right Ear: Tympanic membrane, ear canal and external ear normal.     Left Ear: Tympanic membrane, ear canal and external ear normal.     Nose: Nose normal. No mucosal edema or rhinorrhea.     Mouth/Throat:     Pharynx: Uvula midline. No oropharyngeal exudate.  Eyes:     Conjunctiva/sclera: Conjunctivae normal.  Neck:     Thyroid: No thyromegaly.     Trachea: Trachea normal. No tracheal tenderness or tracheal deviation.  Cardiovascular:     Rate and Rhythm: Normal rate and regular rhythm.     Heart sounds: Normal heart sounds, S1 normal and S2 normal. No murmur heard. Pulmonary:     Effort: No respiratory distress.     Breath sounds: Normal breath sounds. No stridor. No wheezing or rales.  Lymphadenopathy:     Head:     Right side of head: No tonsillar adenopathy.     Left side of head: No tonsillar adenopathy.     Cervical: No cervical adenopathy.  Skin:    Findings: No erythema or rash.     Nails: There is no clubbing.  Neurological:     Mental Status: She is alert.    Diagnostics:    Results of a chest x-ray obtained 17 February 2021 identifies the following:  Stable cardiomediastinal silhouette with normal heart  size. No pneumothorax. No pleural effusion. Lungs appear clear, with no acute consolidative airspace disease and no pulmonary edema.  Assessment and Plan:   1. Perennial allergic rhinitis   2. LPRD (laryngopharyngeal reflux disease)   3. Migraine syndrome   4. 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  Allergen avoidance measures - dust mite, pollens, molds, cat  2.  Continue to treat and prevent inflammation:   A. Nasacort - 1-2 sprays each nostril 1-7 times per week  3.  Continue to Treat and prevent reflux:   A.  Pantoprazole 40 mg in AM  B.  famotidine 40 mg in evening  4. If needed:   A. Loratadine 10 mg - 1 tablet 1-2 times per day  5.  Treat and prevent headache/sleep dysfunction:   A. Modify TV behavior  B. White noise during sleep time  C. Further treatment???    6. Return to clinic in 6 months or earlier if problem.  I think the big issues for Genita at this point in time is her headaches and her sleep dysfunction and I made some suggestions regarding her behavior at nighttime involving the television viewing and also about attempting to decrease any triggers that may be awakening her at nighttime.  If she does not do well with this behavioral modification plan I have asked her to contact me and we will start her on some Periactin.  I offered her Periactin in the past but she was not particularly interested in using that medication because of potential side effects.  She is somewhat leery about using medications in general because of side effects. She can continue to treat her reflux and inflammation of her upper airway as noted above.  I will see her back in this clinic in 6 months or earlier if there is a problem.  Allena Katz, MD Allergy / Immunology [place]

## 2021-04-03 ENCOUNTER — Encounter: Payer: Self-pay | Admitting: Internal Medicine

## 2021-04-03 ENCOUNTER — Emergency Department (HOSPITAL_BASED_OUTPATIENT_CLINIC_OR_DEPARTMENT_OTHER)
Admission: EM | Admit: 2021-04-03 | Discharge: 2021-04-03 | Disposition: A | Discharge status: Home / Self Care | Payer: Medicare Other | Attending: Emergency Medicine | Admitting: Emergency Medicine

## 2021-04-03 ENCOUNTER — Other Ambulatory Visit: Payer: Self-pay

## 2021-04-03 ENCOUNTER — Encounter: Payer: Self-pay | Admitting: Allergy and Immunology

## 2021-04-03 ENCOUNTER — Ambulatory Visit (INDEPENDENT_AMBULATORY_CARE_PROVIDER_SITE_OTHER): Payer: Medicare Other | Admitting: Internal Medicine

## 2021-04-03 ENCOUNTER — Encounter (HOSPITAL_BASED_OUTPATIENT_CLINIC_OR_DEPARTMENT_OTHER): Payer: Self-pay | Admitting: Obstetrics and Gynecology

## 2021-04-03 ENCOUNTER — Emergency Department (HOSPITAL_BASED_OUTPATIENT_CLINIC_OR_DEPARTMENT_OTHER): Payer: Medicare Other

## 2021-04-03 VITALS — BP 100/60 | HR 41 | Temp 97.8°F | Resp 16 | Ht 65.0 in | Wt 143.4 lb

## 2021-04-03 DIAGNOSIS — Z0000 Encounter for general adult medical examination without abnormal findings: Secondary | ICD-10-CM

## 2021-04-03 DIAGNOSIS — Z0001 Encounter for general adult medical examination with abnormal findings: Secondary | ICD-10-CM

## 2021-04-03 DIAGNOSIS — Z136 Encounter for screening for cardiovascular disorders: Secondary | ICD-10-CM

## 2021-04-03 DIAGNOSIS — R0989 Other specified symptoms and signs involving the circulatory and respiratory systems: Secondary | ICD-10-CM

## 2021-04-03 DIAGNOSIS — R519 Headache, unspecified: Secondary | ICD-10-CM | POA: Insufficient documentation

## 2021-04-03 DIAGNOSIS — Z1211 Encounter for screening for malignant neoplasm of colon: Secondary | ICD-10-CM

## 2021-04-03 DIAGNOSIS — Z7982 Long term (current) use of aspirin: Secondary | ICD-10-CM | POA: Diagnosis not present

## 2021-04-03 DIAGNOSIS — R058 Other specified cough: Secondary | ICD-10-CM

## 2021-04-03 DIAGNOSIS — I495 Sick sinus syndrome: Secondary | ICD-10-CM

## 2021-04-03 DIAGNOSIS — R55 Syncope and collapse: Secondary | ICD-10-CM

## 2021-04-03 DIAGNOSIS — F40298 Other specified phobia: Secondary | ICD-10-CM

## 2021-04-03 DIAGNOSIS — I10 Essential (primary) hypertension: Secondary | ICD-10-CM

## 2021-04-03 DIAGNOSIS — K581 Irritable bowel syndrome with constipation: Secondary | ICD-10-CM

## 2021-04-03 DIAGNOSIS — R42 Dizziness and giddiness: Secondary | ICD-10-CM

## 2021-04-03 DIAGNOSIS — Z8249 Family history of ischemic heart disease and other diseases of the circulatory system: Secondary | ICD-10-CM

## 2021-04-03 DIAGNOSIS — E559 Vitamin D deficiency, unspecified: Secondary | ICD-10-CM

## 2021-04-03 DIAGNOSIS — K219 Gastro-esophageal reflux disease without esophagitis: Secondary | ICD-10-CM

## 2021-04-03 DIAGNOSIS — M810 Age-related osteoporosis without current pathological fracture: Secondary | ICD-10-CM

## 2021-04-03 DIAGNOSIS — R7309 Other abnormal glucose: Secondary | ICD-10-CM

## 2021-04-03 DIAGNOSIS — E782 Mixed hyperlipidemia: Secondary | ICD-10-CM

## 2021-04-03 DIAGNOSIS — Z79899 Other long term (current) drug therapy: Secondary | ICD-10-CM

## 2021-04-03 NOTE — ED Notes (Signed)
Discharge instructions discussed with pt. Pt verbalized understanding with no questions at this time. Pt to go home with s/o at bedside 

## 2021-04-03 NOTE — Discharge Instructions (Addendum)
Please continue taking tylenol 500mg  twice daily for the next week. You can take one extra per day if needed.   Pleasure use ibuprofen 400 mg once daily as needed for breakthrough headaches. Try to avoid using this daily with your history of GERD.

## 2021-04-03 NOTE — ED Triage Notes (Signed)
Patient presents to the ER for a fall x1 week ago in which she hit her head on a table, headaches that have gotten progressively worse, and feelings of weakness, dizziness and lethargy

## 2021-04-04 ENCOUNTER — Other Ambulatory Visit: Payer: Self-pay | Admitting: Internal Medicine

## 2021-04-04 LAB — URINALYSIS, ROUTINE W REFLEX MICROSCOPIC
Bacteria, UA: NONE SEEN /HPF
Bilirubin Urine: NEGATIVE
Glucose, UA: NEGATIVE
Hgb urine dipstick: NEGATIVE
Hyaline Cast: NONE SEEN /LPF
Ketones, ur: NEGATIVE
Nitrite: NEGATIVE
Protein, ur: NEGATIVE
RBC / HPF: NONE SEEN /HPF (ref 0–2)
Specific Gravity, Urine: 1.011 (ref 1.001–1.035)
Squamous Epithelial / HPF: NONE SEEN /HPF (ref <=5)
WBC, UA: NONE SEEN /HPF (ref 0–5)
pH: 6.5 (ref 5.0–8.0)

## 2021-04-04 LAB — COMPLETE METABOLIC PANEL WITH GFR
AG Ratio: 1.4 (calc) (ref 1.0–2.5)
ALT: 12 U/L (ref 6–29)
AST: 17 U/L (ref 10–35)
Albumin: 4 g/dL (ref 3.6–5.1)
Alkaline phosphatase (APISO): 70 U/L (ref 37–153)
BUN: 12 mg/dL (ref 7–25)
CO2: 29 mmol/L (ref 20–32)
Calcium: 9.6 mg/dL (ref 8.6–10.4)
Chloride: 105 mmol/L (ref 98–110)
Creat: 0.88 mg/dL (ref 0.50–1.05)
Globulin: 2.9 g/dL (calc) (ref 1.9–3.7)
Glucose, Bld: 66 mg/dL (ref 65–99)
Potassium: 4.5 mmol/L (ref 3.5–5.3)
Sodium: 140 mmol/L (ref 135–146)
Total Bilirubin: 0.4 mg/dL (ref 0.2–1.2)
Total Protein: 6.9 g/dL (ref 6.1–8.1)
eGFR: 72 mL/min/{1.73_m2} (ref >=60)

## 2021-04-04 LAB — CBC WITH DIFFERENTIAL/PLATELET
Absolute Monocytes: 261 cells/uL (ref 200–950)
Basophils Absolute: 20 cells/uL (ref 0–200)
Basophils Relative: 0.7 %
Eosinophils Absolute: 99 cells/uL (ref 15–500)
Eosinophils Relative: 3.4 %
HCT: 38.5 % (ref 35.0–45.0)
Hemoglobin: 12.9 g/dL (ref 11.7–15.5)
Lymphs Abs: 1569 cells/uL (ref 850–3900)
MCH: 28.9 pg (ref 27.0–33.0)
MCHC: 33.5 g/dL (ref 32.0–36.0)
MCV: 86.1 fL (ref 80.0–100.0)
MPV: 12 fL (ref 7.5–12.5)
Monocytes Relative: 9 %
Neutro Abs: 951 cells/uL — ABNORMAL LOW (ref 1500–7800)
Neutrophils Relative %: 32.8 %
Platelets: 158 10*3/uL (ref 140–400)
RBC: 4.47 10*6/uL (ref 3.80–5.10)
RDW: 13 % (ref 11.0–15.0)
Total Lymphocyte: 54.1 %
WBC: 2.9 10*3/uL — ABNORMAL LOW (ref 3.8–10.8)

## 2021-04-04 LAB — HEMOGLOBIN A1C
Hgb A1c MFr Bld: 5.6 % of total Hgb (ref <5.7)
Mean Plasma Glucose: 114 mg/dL
eAG (mmol/L): 6.3 mmol/L

## 2021-04-04 LAB — MICROALBUMIN / CREATININE URINE RATIO
Creatinine, Urine: 73 mg/dL (ref 20–275)
Microalb, Ur: <0.2 mg/dL

## 2021-04-04 LAB — VITAMIN D 25 HYDROXY (VIT D DEFICIENCY, FRACTURES): Vit D, 25-Hydroxy: 58 ng/mL (ref 30–100)

## 2021-04-04 LAB — MICROSCOPIC MESSAGE

## 2021-04-04 LAB — MAGNESIUM: Magnesium: 2.2 mg/dL (ref 1.5–2.5)

## 2021-04-04 LAB — INSULIN, RANDOM: Insulin: 11 u[IU]/mL

## 2021-04-04 LAB — LIPID PANEL
Cholesterol: 193 mg/dL (ref <200)
HDL: 73 mg/dL (ref >=50)
LDL Cholesterol (Calc): 106 mg/dL (calc) — ABNORMAL HIGH
Non-HDL Cholesterol (Calc): 120 mg/dL (calc) (ref <130)
Total CHOL/HDL Ratio: 2.6 (calc) (ref <5.0)
Triglycerides: 59 mg/dL (ref <150)

## 2021-04-04 LAB — TSH: TSH: 1.21 mIU/L (ref 0.40–4.50)

## 2021-04-04 MED ORDER — ROSUVASTATIN CALCIUM 10 MG PO TABS: ORAL_TABLET | ORAL | 3 refills | Status: DC

## 2021-04-04 NOTE — ED Provider Notes (Signed)
[facility] EMERGENCY DEPT Provider Note   CSN: [id number] Arrival date & time: 04/03/21  2052     History Chief Complaint  Patient presents with   Headache   Dizziness   Fall    [name] is a 70 y.o. female.  Pateint has been having some medication changes recently but came here because she hit her head last week and has had a worsened headache and lightheadenss, mild confusion since that time. Wanted to ensure she caused no injuries. No other neurologic changes such as vision changes, paresthesias or weakness. No other traumatic injuries. No recent illnesses.    Headache Associated symptoms: dizziness   Dizziness Associated symptoms: headaches   Fall Associated symptoms include headaches.      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   Chronic idiopathic constipation 08/27/2020   Costochondritis 08/27/2020   Nausea 08/27/2020   Rectal bleeding 08/27/2020   Environmental allergies 04/24/2020   Irritable bowel syndrome with constipation 09/02/2019   Fear of side effects of medication 08/28/2019   Upper airway cough syndrome 05/04/2019   DOE (dyspnea on exertion) 05/03/2019   Bigeminy 03/25/2018   Osteoporosis 08/11/2017   Abnormal glucose 06/25/2017   Medication management 12/12/2014   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 Terrence Dupont)   RADIOACTIVE SEED GUIDED EXCISIONAL BREAST BIOPSY Right 03/11/2016    Procedure: RIGHT RADIOACTIVE SEED GUIDED EXCISIONAL BREAST BIOPSY;  Surgeon: Rolm Bookbinder, MD;  Location: [facility];  Service: General;  Laterality: Right;     OB History     Gravida  0   Para  0   Term  0   Preterm  0   AB  0   Living  0      SAB  0   IAB  0   Ectopic  0   Multiple  0   Live Births  0           Family History  Problem Relation Age of Onset   Hypertension Mother    Other Mother        surgeries involving upper back   Diabetes Father    Heart failure Father    Cancer Maternal Grandmother        Lung   Allergic rhinitis Neg Hx    Angioedema Neg Hx    Asthma Neg Hx    Atopy Neg Hx    Eczema Neg Hx    Immunodeficiency Neg Hx    Urticaria Neg Hx     Social History   Tobacco Use   Smoking status: Never    Passive exposure: Never   Smokeless tobacco: Never  Vaping Use   Vaping Use: Never used  Substance Use Topics   Alcohol use: Not Currently   Drug use: No    Home Medications Prior to Admission medications  Medication Sig Start Date End Date Taking? Authorizing Provider  aspirin 81 MG tablet Take 81 mg by mouth daily.    [provider]  Bacillus Coagulans-Inulin (ALIGN PREBIOTIC-PROBIOTIC PO) Take 1 tablet by mouth daily.    [provider]  Calcium Carbonate-Vit D-Min (CALCIUM 1200 PO) Take by mouth.    [provider]  Cholecalciferol 125 MCG (5000 UT) capsule Take 5,000 Units by mouth daily.    [provider]  ezetimibe (ZETIA) 10 MG tablet Take   1 tablet   Daily for Cholesterol 07/09/20   Unk Pinto, MD  famotidine (PEPCID) 40 MG tablet TAKE 1 TABLET BY MOUTH EVERY DAY IN THE EVENING 04/02/21   Kozlow, Donnamarie Poag, MD  loratadine (CLARITIN) 10 MG tablet Take 1 tablet 1-2 times daily 04/02/21   Kozlow, Donnamarie Poag, MD  Lutein 20 MG TABS Take by mouth.    [provider]  Multiple Vitamins-Minerals (AIRBORNE) CHEW Chew 1 tablet by mouth as needed.    [provider]  pantoprazole (PROTONIX) 40 MG tablet Take 1 tablet (40 mg total) by mouth in the morning. 04/02/21   Kozlow, Donnamarie Poag, MD  Polyethylene Glycol 3350 (MIRALAX PO) Take by mouth at bedtime.    [provider]  RESTASIS 0.05 % ophthalmic emulsion instill 1 drop into both eyes twice a day 11/08/14   [provider]  triamcinolone (NASACORT ALLERGY 24HR) 55 MCG/ACT AERO nasal inhaler 1-2 sprays per nostril 1-7 times per week. 04/02/21   Kozlow, Donnamarie Poag, MD  Triamcinolone Acetonide (NASACORT AQ NA) Place into the nose.    [provider]    Allergies    Bactrim [sulfamethoxazole-trimethoprim], Cephalexin, and Sulfa antibiotics  Review of Systems   Review of Systems  Neurological:  Positive for dizziness and headaches.  All other systems reviewed and are negative.  Physical Exam Updated Vital Signs BP 105/69   Pulse 62   Temp 98.1 F (36.7 C) (Oral)   Resp 14   Ht 5\' 5"  (1.651 m)   Wt 65 kg   SpO2 100%   BMI 23.85 kg/m   Physical Exam Vitals and nursing note reviewed.  Constitutional:      Appearance: She is well-developed.  HENT:     Head: Normocephalic and atraumatic.  Eyes:     Extraocular Movements: Extraocular movements intact.  Cardiovascular:     Rate and Rhythm: Normal rate and regular rhythm.  Pulmonary:     Effort: No respiratory distress.     Breath sounds: No stridor.  Abdominal:     General: There is no distension.  Musculoskeletal:     Cervical back: Normal range of motion.  Skin:    General: Skin is warm and dry.  Neurological:     Mental Status: She is alert.     Deep Tendon Reflexes: Left Babinski's sign: \.    ED Results / Procedures / Treatments   Labs (all labs ordered are listed, but only abnormal results are displayed) Labs Reviewed - No data to display  EKG None  Radiology CT Head Wo Contrast  Result Date: 04/03/2021 CLINICAL DATA:  Head trauma headache EXAM: CT HEAD WITHOUT CONTRAST CT CERVICAL  SPINE WITHOUT CONTRAST TECHNIQUE: Multidetector CT imaging of the head and cervical spine was performed following the standard protocol without intravenous contrast. Multiplanar CT image reconstructions of the cervical spine were also generated. COMPARISON:  CT brain 06/28/2020 FINDINGS: CT HEAD FINDINGS Brain: No acute territorial infarction, hemorrhage or intracranial mass. The  ventricles are nonenlarged. Vascular: No hyperdense vessels.  Carotid vascular calcification Skull: Normal. Negative for fracture or focal lesion. Sinuses/Orbits: No acute finding. Other: None CT CERVICAL SPINE FINDINGS Alignment: No subluxation. Mild reversal of cervical lordosis. Facet alignment within normal limits. Skull base and vertebrae: No acute fracture. No primary bone lesion or focal pathologic process. Soft tissues and spinal canal: No prevertebral fluid or swelling. No visible canal hematoma. Disc levels:  Moderate degenerative changes C4-C5, C5-C6 and C6-C7. Upper chest: Lung apices are clear. 2 cm hypodense nodule right lobe of thyroid. Other: None IMPRESSION: 1. Negative non contrasted CT appearance of the brain. 2. Mild reversal of cervical lordosis with degenerative change. No acute osseous abnormality 3. 2 cm hypodense nodule right lobe of thyroid. Recommend thyroid US (ref: J Am Coll Radiol. 2015 Feb;12(2): 143-50). This may be performed on a nonemergent basis Electronically Signed   By: Donavan Foil M.D.   On: 04/03/2021 21:45   CT Cervical Spine Wo Contrast  Result Date: 04/03/2021 CLINICAL DATA:  Head trauma headache EXAM: CT HEAD WITHOUT CONTRAST CT CERVICAL SPINE WITHOUT CONTRAST TECHNIQUE: Multidetector CT imaging of the head and cervical spine was performed following the standard protocol without intravenous contrast. Multiplanar CT image reconstructions of the cervical spine were also generated. COMPARISON:  CT brain 06/28/2020 FINDINGS: CT HEAD FINDINGS Brain: No acute territorial infarction, hemorrhage or  intracranial mass. The ventricles are nonenlarged. Vascular: No hyperdense vessels.  Carotid vascular calcification Skull: Normal. Negative for fracture or focal lesion. Sinuses/Orbits: No acute finding. Other: None CT CERVICAL SPINE FINDINGS Alignment: No subluxation. Mild reversal of cervical lordosis. Facet alignment within normal limits. Skull base and vertebrae: No acute fracture. No primary bone lesion or focal pathologic process. Soft tissues and spinal canal: No prevertebral fluid or swelling. No visible canal hematoma. Disc levels:  Moderate degenerative changes C4-C5, C5-C6 and C6-C7. Upper chest: Lung apices are clear. 2 cm hypodense nodule right lobe of thyroid. Other: None IMPRESSION: 1. Negative non contrasted CT appearance of the brain. 2. Mild reversal of cervical lordosis with degenerative change. No acute osseous abnormality 3. 2 cm hypodense nodule right lobe of thyroid. Recommend thyroid US (ref: J Am Coll Radiol. 2015 Feb;12(2): 143-50). This may be performed on a nonemergent basis Electronically Signed   By: Donavan Foil M.D.   On: 04/03/2021 21:45    Procedures Procedures   Medications Ordered in ED Medications - No data to display  ED Course  I have reviewed the triage vital signs and the nursing notes.  Pertinent labs & imaging results that were available during my care of the patient were reviewed by me and considered in my medical decision making (see chart for details).    MDM Rules/Calculators/A&P                         Workupreassuring. Offered headache cocktail but patietn was content knowing it wasn't a bleed or mass. Will fu w/ neurology for repeat evaluation and headache management.    Final Clinical Impression(s) / ED Diagnoses Final diagnoses:  Nonintractable headache, unspecified chronicity pattern, unspecified headache type  Lightheadedness    Rx / DC Orders ED Discharge Orders          Ordered    Ambulatory referral to Neurology       Comments:  An appointment is requested in approximately: 2 weeks   04/03/21 2349             Austine Kelsay, Corene Cornea,  MD 04/04/21 4650

## 2021-04-17 LAB — HM DIABETES EYE EXAM

## 2021-04-22 NOTE — Progress Notes (Signed)
70 y.o. [place] Married Black or [place] American Not Hispanic or Latino female here for annual exam.     She was evaluated earlier this year for vaginal spotting. She was noted to have a very atrophic vagina, friable with exam. Thin endometrial stripe on ultrasound. No further bleeding. She is sexually active, slight discomfort, helped with astroglide. Not bleeding after sex.   H/O benign breast biopsy.   C/O low libido. Able to have an orgasm.  Her prior abdominal cramping has improved with cutting out gluten.   No bladder c/o.   No LMP recorded. Patient is postmenopausal.          Sexually active: Yes.    The current method of family planning is post menopausal status.    Exercising: No.  The patient does not participate in regular exercise at present. Smoker:  no  Health Maintenance: Pap:  04/13/20- WNL, 04/06/17- WNL  History of abnormal Pap:  no MMG:  01/2020? [facility]21 [facility] per patient BMD:   01/2020 osteoporosis -3.3, seeing Endocrinology.  Colonoscopy: 10/18/2018- f/u in 10 years  TDaP:  05/18/2007? -- unsure Gardasil: n/a   reports that she has never smoked. She has never been exposed to tobacco smoke. She has never used smokeless tobacco. She reports that she does not currently use alcohol. She reports that she does not use drugs. Retired  Past Medical History:  Diagnosis Date   Arthritis    cervical and lumbar   Cataract    Mixed OU   Depression    GERD (gastroesophageal reflux disease)    Hyperlipidemia    IBS (irritable bowel syndrome)    Osteoporosis    Prediabetes    Vitamin D deficiency     Past Surgical History:  Procedure Laterality Date   CARDIAC CATHETERIZATION  2011   normal (Dr. Terrence Dupont)   RADIOACTIVE SEED GUIDED EXCISIONAL BREAST BIOPSY Right 03/11/2016   Procedure: RIGHT RADIOACTIVE SEED GUIDED EXCISIONAL BREAST BIOPSY;  Surgeon: Rolm Bookbinder, MD;  Location: [facility];  Service: General;  Laterality: Right;    Current  Outpatient Medications  Medication Sig Dispense Refill   aspirin 81 MG tablet Take 81 mg by mouth daily.     Bacillus Coagulans-Inulin (ALIGN PREBIOTIC-PROBIOTIC PO) Take 1 tablet by mouth daily.     Calcium Carbonate-Vit D-Min (CALCIUM 1200 PO) Take by mouth.     Cholecalciferol 125 MCG (5000 UT) capsule Take 5,000 Units by mouth daily.     ezetimibe (ZETIA) 10 MG tablet Take   1 tablet   Daily for Cholesterol 90 tablet 0   famotidine (PEPCID) 40 MG tablet TAKE 1 TABLET BY MOUTH EVERY DAY IN THE EVENING 30 tablet 5   loratadine (CLARITIN) 10 MG tablet Take 1 tablet 1-2 times daily 60 tablet 5   Lutein 20 MG TABS Take by mouth.     Multiple Vitamins-Minerals (AIRBORNE) CHEW Chew 1 tablet by mouth as needed.     pantoprazole (PROTONIX) 40 MG tablet Take 1 tablet (40 mg total) by mouth in the morning. 60 tablet 5   Polyethylene Glycol 3350 (MIRALAX PO) Take by mouth at bedtime.     RESTASIS 0.05 % ophthalmic emulsion instill 1 drop into both eyes twice a day  0   rosuvastatin (CRESTOR) 10 MG tablet Take 1 tablet Daily for Cholesterol 90 tablet 3   triamcinolone (NASACORT ALLERGY 24HR) 55 MCG/ACT AERO nasal inhaler 1-2 sprays per nostril 1-7 times per week. 16.5 g 5   Triamcinolone Acetonide (NASACORT  AQ NA) Place into the nose.     No current facility-administered medications for this visit.    Family History  Problem Relation Age of Onset   Hypertension Mother    Other Mother        surgeries involving upper back   Diabetes Father    Heart failure Father    Cancer Maternal Grandmother        Lung   Allergic rhinitis Neg Hx    Angioedema Neg Hx    Asthma Neg Hx    Atopy Neg Hx    Eczema Neg Hx    Immunodeficiency Neg Hx    Urticaria Neg Hx     Review of Systems  Endocrine: Positive for heat intolerance.       Hot flashes  All other systems reviewed and are negative.  Exam:   BP 116/70 (BP Location: Right Arm, Patient Position: Sitting, Cuff Size: Normal)   Pulse 68   Resp  12   Ht 5\' 3"  (1.6 m)   Wt 140 lb (63.5 kg)   BMI 24.80 kg/m   Weight change: @WEIGHTCHANGE @ Height:   Height: 5\' 3"  (160 cm)  Ht Readings from Last 3 Encounters:  04/23/21 5\' 3"  (1.6 m)  04/03/21 5\' 5"  (1.651 m)  04/03/21 5\' 5"  (1.651 m)    General appearance: alert, cooperative and appears stated age Head: Normocephalic, without obvious abnormality, atraumatic Neck: no adenopathy, supple, symmetrical, trachea midline and thyroid normal to inspection and palpation Breasts: normal appearance, no masses or tenderness Abdomen: soft, non-tender; non distended,  no masses,  no organomegaly Extremities: extremities normal, atraumatic, no cyanosis or edema Skin: Skin color, texture, turgor normal. No rashes or lesions Lymph nodes: Cervical, supraclavicular, and axillary nodes normal. No abnormal inguinal nodes palpated Neurologic: Grossly normal   Pelvic: External genitalia:  no lesions              Urethra:  normal appearing urethra with no masses, tenderness or lesions              Bartholins and Skenes: normal                 Vagina: atrophic appearing vagina with normal color and discharge, no lesions              Cervix: no lesions               Bimanual Exam:  Uterus:  normal size, contour, position, consistency, mobility, non-tender              Adnexa: no mass, fullness, tenderness               Rectovaginal: Confirms               Anus:  normal sphincter tone, no lesions  Gae Dry chaperoned for the exam.  1. Encounter for gynecological examination without abnormal finding No pap this year Labs with primary Discussed breast self awareness Mammogram and colonoscopy are UTD  2. Vaginal atrophy - Estradiol 10 MCG TABS vaginal tablet; Place one tablet vaginally qhs x 1 week, then change to 2 x a week at hs  Dispense: 24 tablet; Refill: 4  3. Female dyspareunia - Estradiol 10 MCG TABS vaginal tablet; Place one tablet vaginally qhs x 1 week, then change to 2 x a week at  hs  Dispense: 24 tablet; Refill: 4  4. Low libido - Testos,Total,Free and SHBG (Female) -Information on libido given -discussed testosterone therapy, aware not  FDA approved, discussed need for f/u

## 2021-04-23 ENCOUNTER — Other Ambulatory Visit: Payer: Self-pay

## 2021-04-23 ENCOUNTER — Ambulatory Visit (INDEPENDENT_AMBULATORY_CARE_PROVIDER_SITE_OTHER): Payer: Medicare Other | Admitting: Obstetrics and Gynecology

## 2021-04-23 ENCOUNTER — Encounter: Payer: Medicare Other | Admitting: Obstetrics and Gynecology

## 2021-04-23 ENCOUNTER — Encounter: Payer: Self-pay | Admitting: Obstetrics and Gynecology

## 2021-04-23 VITALS — BP 116/70 | HR 68 | Resp 12 | Ht 63.0 in | Wt 140.0 lb

## 2021-04-23 DIAGNOSIS — N941 Unspecified dyspareunia: Secondary | ICD-10-CM | POA: Diagnosis not present

## 2021-04-23 DIAGNOSIS — Z01419 Encounter for gynecological examination (general) (routine) without abnormal findings: Secondary | ICD-10-CM | POA: Diagnosis not present

## 2021-04-23 DIAGNOSIS — N952 Postmenopausal atrophic vaginitis: Secondary | ICD-10-CM | POA: Diagnosis not present

## 2021-04-23 DIAGNOSIS — R6882 Decreased libido: Secondary | ICD-10-CM

## 2021-04-23 MED ORDER — ESTRADIOL 10 MCG VA TABS
ORAL_TABLET | VAGINAL | 4 refills | Status: DC
Start: 2021-04-23 — End: 2021-10-02

## 2021-04-23 NOTE — Patient Instructions (Signed)

## 2021-04-26 LAB — TESTOS,TOTAL,FREE AND SHBG (FEMALE)
Free Testosterone: 1.6 pg/mL (ref 0.1–6.4)
Sex Hormone Binding: 106 nmol/L — ABNORMAL HIGH (ref 14–73)
Testosterone, Total, LC-MS-MS: 22 ng/dL (ref 2–45)

## 2021-05-03 ENCOUNTER — Encounter (HOSPITAL_COMMUNITY): Payer: Self-pay | Admitting: Radiology

## 2021-05-07 ENCOUNTER — Other Ambulatory Visit: Payer: Self-pay

## 2021-05-07 DIAGNOSIS — E782 Mixed hyperlipidemia: Secondary | ICD-10-CM

## 2021-05-07 MED ORDER — EZETIMIBE 10 MG PO TABS: ORAL_TABLET | ORAL | 0 refills | Status: DC

## 2021-05-13 ENCOUNTER — Other Ambulatory Visit: Payer: Self-pay

## 2021-05-13 DIAGNOSIS — Z1211 Encounter for screening for malignant neoplasm of colon: Secondary | ICD-10-CM

## 2021-05-13 LAB — POC HEMOCCULT BLD/STL (HOME/3-CARD/SCREEN)
Card #2 Fecal Occult Blod, POC: NEGATIVE
Card #3 Fecal Occult Blood, POC: NEGATIVE
Fecal Occult Blood, POC: NEGATIVE

## 2021-05-14 ENCOUNTER — Other Ambulatory Visit: Payer: Self-pay | Admitting: Internal Medicine

## 2021-05-14 DIAGNOSIS — E041 Nontoxic single thyroid nodule: Secondary | ICD-10-CM

## 2021-05-14 DIAGNOSIS — Z1212 Encounter for screening for malignant neoplasm of rectum: Secondary | ICD-10-CM | POA: Diagnosis not present

## 2021-05-14 DIAGNOSIS — Z1211 Encounter for screening for malignant neoplasm of colon: Secondary | ICD-10-CM | POA: Diagnosis not present

## 2021-05-22 ENCOUNTER — Encounter: Payer: Self-pay | Admitting: Internal Medicine

## 2021-05-23 ENCOUNTER — Ambulatory Visit
Admission: RE | Admit: 2021-05-23 | Discharge: 2021-05-23 | Disposition: A | Discharge status: Home / Self Care | Payer: Medicare Other | Source: Ambulatory Visit | Attending: Internal Medicine | Admitting: Internal Medicine

## 2021-05-23 DIAGNOSIS — E041 Nontoxic single thyroid nodule: Secondary | ICD-10-CM

## 2021-05-23 NOTE — Progress Notes (Signed)
============================================================ ============================================================  -   Thyroid U/S is OK - No Significant Change & Radiologist recommends                                     continued annual follow-up for 5 years to assure stability                   ( So it's a  Good Report  ! )

## 2021-06-14 ENCOUNTER — Other Ambulatory Visit: Payer: Self-pay

## 2021-06-25 ENCOUNTER — Ambulatory Visit: Payer: Medicare Other | Admitting: Neurology

## 2021-06-25 ENCOUNTER — Encounter: Payer: Self-pay | Admitting: Neurology

## 2021-06-25 VITALS — Ht 64.5 in | Wt 141.6 lb

## 2021-06-25 DIAGNOSIS — R200 Anesthesia of skin: Secondary | ICD-10-CM

## 2021-06-25 DIAGNOSIS — R202 Paresthesia of skin: Secondary | ICD-10-CM | POA: Diagnosis not present

## 2021-06-25 DIAGNOSIS — R519 Headache, unspecified: Secondary | ICD-10-CM | POA: Diagnosis not present

## 2021-06-25 NOTE — Progress Notes (Addendum)
[facility]    Provider:  Dr Jaynee Eagles Requesting Provider: Merrily Pew, MD Primary Care Provider:  Unk Pinto, MD  CC:  headaches  HPI:  [name] is a 71 y.o. female here as requested by Merrily Pew, MD for headaches and light headedness but patient states she is her for ulnar neuropathy.  Past medical history arthritis, depression, hyperlipidemia, prediabetes.  I reviewed Dr. Weston Brass notes, neurologic exam and physical exam are both normal, I personally reviewed CT of the head and cervical spine nothing acute, patient was happy knowing it was not bleeding and declined headache cocktail.  Patient is here after ED visit. CT head and cervical spine were unremarkable. In October she had pain in the back of her head, like a "thunder", the headaches improved since then, that is what she went to the ED for but those has resolved. She had a headache this mornig above the left eye, she has headaches every 2 weeks, left above the eye, pulsating/pounding/throbbing, no head trauma since early fall she fell asleep and she hit her head on the table but that is not affecting her now and not associated with the headaches. Every 2 weeks, it may feel hard, above the left eye, some vision changes, new headache, since October she has had them less and less. They are mild, not disabling and improving. Dizziness is nothing new stable and chronic, no vision changes, dxed with vertigo in the past. Hd eyes checked. No jaw pain, no vision changes, no new muscular or neck/shoulder girdle pain. They can tell she is grinding and she wakes up with headache sometmes in themorning.   Reviewed notes, labs and imaging from outside physicians, which showed:   CT head/c-spine 04/13/21: IMPRESSION: 1. Negative non contrasted CT appearance of the brain. 2. Mild reversal of cervical lordosis with degenerative change. No acute osseous abnormality 3. 2 cm hypodense nodule right lobe of thyroid.  Recommend thyroid US (ref: J Am Coll Radiol. 2015 Feb;12(2): 143-50). This may be performed on a nonemergent basis  CT cervical spine:  Review of Systems: Patient complains of symptoms per HPI as well as the following symptoms improved headache. Pertinent negatives and positives per HPI. All others negative.   Social History   Socioeconomic History   Marital status: Married    Spouse name: Not on file   Number of children: 0   Years of education: Not on file   Highest education level: Not on file  Occupational History   Occupation: retired Copywriter, advertising  Tobacco Use   Smoking status: Never    Passive exposure: Never   Smokeless tobacco: Never  Vaping Use   Vaping Use: Never used  Substance and Sexual Activity   Alcohol use: Not Currently   Drug use: No   Sexual activity: Yes    Birth control/protection: Post-menopausal    Comment: 1st intercourse 29 yo-5 partners  Other Topics Concern   Not on file  Social History Narrative   Lives at home with spouse   Right handed   Caffeine: not much, drinks decaf 2 cups/day   Social Determinants of Health   Financial Resource Strain: Not on file  Food Insecurity: Not on file  Transportation Needs: Not on file  Physical Activity: Not on file  Stress: Not on file  Social Connections: Not on file  Intimate Partner Violence: Not on file    Family History  Problem Relation Age of Onset   Hypertension Mother    Other Mother  surgeries involving upper back   Diabetes Father    Heart failure Father    Cancer Maternal Grandmother        Lung   Allergic rhinitis Neg Hx    Angioedema Neg Hx    Asthma Neg Hx    Atopy Neg Hx    Eczema Neg Hx    Immunodeficiency Neg Hx    Urticaria Neg Hx    Migraines Neg Hx     Past Medical History:  Diagnosis Date   Arthritis    cervical and lumbar   Cataract    Mixed OU   Depression    GERD (gastroesophageal reflux disease)    Hyperlipidemia    IBS (irritable bowel  syndrome)    Osteoporosis    Prediabetes    Vitamin D deficiency     Patient Active Problem List   Diagnosis Date Noted   Numbness and tingling of left leg 06/26/2021   Left temporal headache 06/25/2021   Abnormal weight loss 08/27/2020   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   Chronic idiopathic constipation 08/27/2020   Costochondritis 08/27/2020   Nausea 08/27/2020   Rectal bleeding 08/27/2020   Environmental allergies 04/24/2020   Irritable bowel syndrome with constipation 09/02/2019   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 Terrence Dupont)   RADIOACTIVE SEED GUIDED EXCISIONAL BREAST BIOPSY Right 03/11/2016   Procedure: RIGHT RADIOACTIVE SEED GUIDED EXCISIONAL BREAST BIOPSY;  Surgeon: Rolm Bookbinder, MD;  Location: [place];  Service: General;  Laterality: Right;    Current Outpatient Medications  Medication Sig Dispense Refill   aspirin 81 MG tablet Take 81 mg by mouth daily.     Bacillus Coagulans-Inulin (ALIGN PREBIOTIC-PROBIOTIC PO) Take 1 tablet by mouth daily.     Calcium Carbonate-Vit D-Min (CALCIUM 1200 PO) Take by mouth.     Cholecalciferol 125 MCG (5000 UT) capsule Take 5,000 Units by mouth daily.     Estradiol 10 MCG TABS vaginal tablet Place one tablet vaginally qhs x 1 week, then change to 2 x a week at hs 24 tablet 4   ezetimibe (ZETIA) 10 MG tablet Take   1 tablet   Daily for Cholesterol 90 tablet 0   famotidine (PEPCID) 40 MG tablet TAKE 1 TABLET BY MOUTH EVERY DAY IN THE EVENING 30  tablet 5   loratadine (CLARITIN) 10 MG tablet Take 1 tablet 1-2 times daily 60 tablet 5   Lutein 20 MG TABS Take by mouth.     Multiple Vitamins-Minerals (AIRBORNE) CHEW Chew 1 tablet by mouth as needed.     pantoprazole (PROTONIX) 40 MG tablet Take 1 tablet (40 mg total) by mouth in the morning. 60 tablet 5   Polyethylene Glycol 3350 (MIRALAX PO) Take by mouth at bedtime.     RESTASIS 0.05 % ophthalmic emulsion instill 1 drop into both eyes twice a day  0   triamcinolone (NASACORT ALLERGY 24HR) 55 MCG/ACT AERO nasal inhaler 1-2 sprays per nostril 1-7 times per week. 16.5 g 5   No current facility-administered  medications for this visit.    Allergies as of 06/25/2021 - Review Complete 06/25/2021  Allergen Reaction Noted   Bactrim [sulfamethoxazole-trimethoprim] Other (See Comments) 05/17/2013   Cephalexin Nausea And Vomiting 01/31/2020   Sulfa antibiotics  04/03/2021    Vitals: Ht 5' 4.5" (1.638 m)    Wt 141 lb 9.6 oz (64.2 kg)    BMI 23.93 kg/m  Last Weight:  Wt Readings from Last 1 Encounters:  06/25/21 141 lb 9.6 oz (64.2 kg)   Last Height:   Ht Readings from Last 1 Encounters:  06/25/21 5' 4.5" (1.638 m)     Physical exam: Exam: Gen: NAD, conversant, well nourised, well groomed                     CV: RRR, no MRG. No Carotid Bruits. No peripheral edema, warm, nontender Eyes: Conjunctivae clear without exudates or hemorrhage  Neuro: Detailed Neurologic Exam  Speech:    Speech is normal; fluent and spontaneous with normal comprehension.  Cognition:    The patient is oriented to person, place, and time;     recent and remote memory intact;     language fluent;     normal attention, concentration,     fund of knowledge Cranial Nerves:    The pupils are equal, round, and reactive to light. Pupils too small to visualize fundi. . Visual fields are full to finger confrontation. Extraocular movements are intact. Trigeminal sensation is intact and the muscles of  mastication are normal. The face is symmetric. The palate elevates in the midline. Hearing intact. Voice is normal. Shoulder shrug is normal. The tongue has normal motion without fasciculations.   Coordination:    Normal f   Gait:    Heel-toe and tandem gait are normal.   Motor Observation:    No asymmetry, no atrophy, and no involuntary movements noted. Tone:    Normal muscle tone.    Posture:    Posture is normal. normal erect    Strength:    Strength is V/V in the upper and lower limbs.      Sensation: intact to LT     Reflex Exam:  DTR's:    Deep tendon reflexes in the upper and lower extremities are normal bilaterally.   Toes:    The toes are downgoing bilaterally.   Clonus:    Clonus is absent.    Assessment/Plan:  Left temporal headache. CT head unremarkable. Is clenching and the pain corresponds to where the muscle tenses when she clenches, recommend seeing dentist and getting a mouthguard, symptoms are improving and just occasional and mild, I wouldn't start on any medication watch it clinically but will check for temporal arteritis. Mychart [place] if worsens. Hold off on MRI brain but low threshold if patient worsens she will mychart [place] an we can order. Neuro exam normal.  She also has some occ. Numbness left thigh and sometimes in the anterior left lower leg, resolves on its own or with changing positions, she has low back pain, could be from low back(radiculopathy) or could be some temporary nerve entrapments such as meralgia paresthetica or peroneal neuropathy just irritating the nerves. If it gets worse can consider PT, MRI lumbar spine or emg/ncs.May also consider discussing hips with primary care because of some pain radiating intoe groin ay be arthritic changes in hips. F/u with primary care and come back to [place] if worsens  Addenddum 06/26/2021: ESR/CRP normal  Orders Placed This Encounter  Procedures  Sedimentation rate   C-reactive protein   No orders of the  defined types were placed in this encounter.   Cc: Mesner, Corene Cornea, MD,  Unk Pinto, MD  Sarina Ill, MD  [facility] [address] [place],  [address]  Phone [phone] Fax [phone]  I spent 70 minutes of face-to-face and non-face-to-face time with patient on the  1. Left temporal headache   2. Numbness and tingling of left leg    diagnosis.  This included previsit chart review, lab review, study review, order entry, electronic health record documentation, patient education on the different diagnostic and therapeutic options, counseling and coordination of care, risks and benefits of management, compliance, or risk factor reduction

## 2021-06-25 NOTE — Patient Instructions (Addendum)
- Left temporal headache. CT head unremarkable. Is clenching and the pain corresponds to where the muscle tenses when she clenches, recommend seeing dentist and getting a mouthguard, symptoms are improving and just occasional and mild, I wouldn't start on any medication watch it clinically but will check for temporal arteritis. Mychart [place] if worsens.  - Hold off on MRI brain but low threshold if patient worsens she will myhart [place] an we can order.   She also has some occ. Numbness left thigh and sometimes in the anterior left lower leg, resolves on its own or with changing positions, she has low back pain, could be from low back(radiculopathy) or could be some temporary nerve entrapments such as meralgia paresthetica or peroneal neuropathy just irritating the nerves. If it gets worse can consider PT, MRI lumbar spine or emg/ncs.May also consider discussing hips with primary care because of some pain radiating intoe groin ay be arthritic changes in hips. F/u with primary care and come back to [place] if worsens  Teeth Grinding Teeth grinding is a common condition in which a person grinds or clenches his or her teeth. This condition is also called bruxism. You can grind or clench your teeth without knowing that you are doing it. This can lead to tooth damage and jaw pain. What are the causes? The cause of this condition is not known. However, teeth grinding may be associated with: Untreated dental problems. Teeth that do not line up correctly (malocclusion). Stress or worry. Earaches. Allergies. Some medicines. Upper respiratory infections. Sleep disorders. What increases the risk? This condition is more likely to develop in: Children. People who have a family history of teeth grinding. People who consume nicotine, caffeine, or excess amounts of alcohol before sleeping. People who feel stressed. What are the signs or symptoms? Symptoms associated with this condition include: Earaches. Teeth that  are sensitive to heat, cold, and sweetness. Damaged teeth. Jaw pain or facial pain. Jaw problems, such as hearing a clicking sound when you open or close your mouth. Mild temporal headaches. Muscle spasms. Trouble sleeping. Trouble eating. In some cases, there are no symptoms. How is this diagnosed? This condition is diagnosed with a medical history and dental exam. To rule out other conditions, you may also have tests, including: X-rays. Blood tests. How is this treated? There is no cure for this condition. Treatment can help control your symptoms and prevent further damage to your teeth. Treatment may include: A night guard. This dental appliance, sometimes referred to as an occlusal guard, is fitted to your teeth. The guard serves as a barrier between your top and bottom teeth so that you grind on the night guard instead. A mouth guard. This may move your lower jaw forward (mandibular advancement device). Some mouth guards also work to correct teeth that do not line up correctly or stabilize teeth after correction, such as with retainers. Medication. In more severe cases, medication may be prescribed at bedtime to relax your jaw muscles. Relaxation techniques. Exercise, yoga, meditation, or hypnosis may be prescribed to reduce stress if this is causing you to grind your teeth. Sufficient sleep. Try to get 8 hours of sleep every night. As you lie in bed, try to keep your face, mouth, and jaw muscles relaxed. You may be told to avoid the following: Sleeping on your back. Try sleeping on your side or your abdomen instead. Chewing gum. Eating foods that are difficult to chew. Consuming alcohol, caffeine, and nicotine, especially before bedtime. Dehydration. Drink enough fluids  to keep your urine pale yellow. Follow these instructions at home: General instructions  Your health care provider may do the following: Make you a night guard and give instructions on how to use it. Wear it as  told by your health care provider. Prescribe over-the-counter and prescription medicines. Recommend applying a warm, wet cloth (warm compress) to your face to help relax your jaw muscles. Do this as told by your health care provider. Prescribe jaw exercises. Keep all follow-up visits. This is important. Contact a health care provider if: Your symptoms get worse. New symptoms appear. You have trouble eating. You have trouble opening your mouth. Summary Teeth grinding is a common condition in which a person grinds or clenches his or her teeth. Symptoms can include jaw problems, facial pain, sensitive teeth, mild headaches, or trouble eating or sleeping. There is no cure for this condition. Treatment can help ease symptoms and prevent further damage to the teeth. Treatment options may include wearing a night guard, wearing a mouth guard to correct teeth that do not line up correctly, taking medication, and finding ways to ease stress. This information is not intended to replace advice given to you by your health care provider. Make sure you discuss any questions you have with your health care provider. Document Revised: 10/11/2019 Document Reviewed: 10/11/2019 Elsevier Patient Education  2022 [place] is a test to check how well your muscles and nerves are working. This procedure includes the combined use of electromyogram (EMG) and nerve conduction study (NCS). EMG is used to evaluate muscles and the nerves that control those muscles. NCS, which is also called electroneurogram, measures how well your nerves conduct electricity. The procedures should be done together to check if your muscles and nerves are healthy. If the results of the tests are abnormal, this may indicate disease or injury, such as a neuromuscular disease or peripheral nerve damage. Tell a health care provider about: Any allergies you have. All medicines you are taking, including  vitamins, herbs, eye drops, creams, and over-the-counter medicines. Any bleeding problems you have. Any surgeries you have had. Any medical conditions you have. What are the risks? Generally, this is a safe procedure. However, problems may occur, including: Bleeding or bruising. Infection where the electrodes were inserted. What happens before the test? Medicines Take all of your usually prescribed medications before this testing is performed. Do not stop your blood thinners unless advised by your prescribing physician. General instructions Your health care provider may ask you to warm the limb that will be checked with warm water, hot pack, or wrapping the limb in a blanket. Do not use lotions or creams on the same day that you will be having the procedure. What happens during the test? For EMG  Your health care provider will ask you to stay in a position so that the muscle being studied can be accessed. You will be sitting or lying down. You may be given a medicine to numb the area (local anesthetic) and the skin will be disinfected. A very thin needle that has an electrode will be inserted into your muscle, one muscle at a time. Typically, multiple muscles are evaluated during a single study. Another small electrode will be placed on your skin near the muscle. Your health care provider will ask you to continue to remain still. The electrodes will record the electrical activity of your muscles. You may see this on a monitor or hear it in the room. After your muscles have  been studied at rest, your health care provider will ask you to contract or flex your muscles. The electrodes will record the electrical activity of your muscles. Your health care provider will remove the electrodes and the electrode needle when the procedure is finished. The procedure may vary among health care providers and hospitals. For NCS  An electrode that records your nerve activity (recording electrode) will be  placed on your skin by the muscle that is being studied. An electrode that is used as a reference (reference electrode) will be placed near the recording electrode. A paste or gel will be applied to your skin between the recording electrode and the reference electrode. Your nerve will be stimulated with a mild shock. The speed of the nerves and strength of response is recorded by the electrodes. Your health care provider will remove the electrodes and the gel when the procedure is finished. The procedure may vary among health care providers and hospitals. What can I expect after the test? It is up to you to get your test results. Ask your health care provider, or the department that is doing the test, when your results will be ready. Your health care provider may: Give you medicines for any pain. Monitor the insertion sites to make sure that bleeding stops. You should be able to drive yourself to and from the test. Discomfort can persist for a few hours after the test, but should be better the next day. Contact a health care provider if: You have swelling, redness, or drainage at any of the insertion sites. Summary Electromyoneurogram is a test to check how well your muscles and nerves are working. If the results of the tests are abnormal, this may indicate disease or injury. This is a safe procedure. However, problems may occur, such as bleeding and infection. Your health care provider will do two tests to complete this procedure. One checks your muscles (EMG) and another checks your nerves (NCS). It is up to you to get your test results. Ask your health care provider, or the department that is doing the test, when your results will be ready. This information is not intended to replace advice given to you by your health care provider. Make sure you discuss any questions you have with your health care provider. Document Revised: 02/13/2021 Document Reviewed: 01/13/2021 Elsevier Patient  Education  [place].

## 2021-06-26 DIAGNOSIS — R200 Anesthesia of skin: Secondary | ICD-10-CM | POA: Insufficient documentation

## 2021-06-26 DIAGNOSIS — R202 Paresthesia of skin: Secondary | ICD-10-CM | POA: Insufficient documentation

## 2021-06-26 LAB — SEDIMENTATION RATE: Sed Rate: 16 mm/hr (ref 0–40)

## 2021-06-26 LAB — C-REACTIVE PROTEIN: CRP: <1 mg/L (ref 0–10)

## 2021-06-26 NOTE — Addendum Note (Signed)
Addended by: Sarina Ill B on: 06/26/2021 11:00 AM   Modules accepted: Level of Service

## 2021-07-17 ENCOUNTER — Ambulatory Visit: Payer: Medicare Other | Admitting: Internal Medicine

## 2021-07-17 ENCOUNTER — Other Ambulatory Visit: Payer: Self-pay

## 2021-07-17 ENCOUNTER — Encounter: Payer: Self-pay | Admitting: Internal Medicine

## 2021-07-17 VITALS — BP 118/62 | HR 50 | Ht 64.5 in | Wt 139.8 lb

## 2021-07-17 DIAGNOSIS — E041 Nontoxic single thyroid nodule: Secondary | ICD-10-CM | POA: Diagnosis not present

## 2021-07-17 DIAGNOSIS — M810 Age-related osteoporosis without current pathological fracture: Secondary | ICD-10-CM | POA: Diagnosis not present

## 2021-07-17 LAB — VITAMIN D 25 HYDROXY (VIT D DEFICIENCY, FRACTURES): VITD: 93.6 ng/mL (ref 30.00–100.00)

## 2021-07-17 NOTE — Patient Instructions (Addendum)
Please continue vitamin D 4000 units daily.   Try to get 1200 mg calcium daily, preferentially from the diet.  We will need another Bone Density Scan after 02/13/2021.  Please come back for a follow-up appointment in 1 year.

## 2021-07-17 NOTE — Progress Notes (Signed)
Patient ID: [name], female   DOB: 21-Mar-1951, 71 y.o.   MRN: [id number]   This visit occurred during the SARS-CoV-2 public health emergency.  Safety protocols were in place, including screening questions prior to the visit, additional usage of staff PPE, and extensive cleaning of exam room while observing appropriate contact time as indicated for disinfecting solutions.   HPI  [name] is a 71 y.o.-year-old female, initially referred by her PCP, Dr. Melford Aase, returning for follow-up for osteoporosis (OP).  She previously saw Dr. Kelton Pillar in our practice in 04/2020 but wanted a second opinion.  Interim history: No fractures or falls since last visit. No dizziness/orthostasis/vertigo/poor vision. She has R hip pain when sleeping if she does not use a pillow between her legs. Also, she has occasional upper back pain.    Reviewed and addended history: Pt was dx with OP in 2019.  I reviewed pt's DXA scan reports: Date L1-L4 T score FN T score 33% distal Radius  02/14/2020 ([facility])  -3.3 RFN: -2.1 LFN: -2.1 n/a  08/05/2017 ([facility])  -3.6 RFN: -2.1 LFN: -2.2 n/a   Previous OP treatments:  - she was recommended Fosamax 70 mg weekly by Dr. 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 Reviewed available vit D levels: Lab Results  Component Value Date   VD25OH 58 04/03/2021   VD25OH 83.8 07/11/2020   VD25OH 83 03/20/2020   VD25OH 71 08/29/2019   VD25OH 59 02/17/2019   VD25OH 74 11/10/2018   VD25OH 58 08/10/2018   VD25OH 70 01/28/2018   VD25OH 56 01/15/2017   VD25OH 64 12/26/2015   Pt is on: - vitamin D 4000 units daily and also a multivitamin, decreased from 5000 units at last visit The dose was decreased in 04/2020.  She is not taking calcium due to constipation.  She is not drinking milk due to hyperlipidemia. Drinks almond milk.  No weight bearing  exercises. She is using an elliptical.  Also, walking 3-5 times weekly, 45 minutes at a time.  She does not take high vitamin A doses.  No recent steroid use.  She had 3-4 prednisone tapers in the past.  No smoking.  Alcohol: Beer or wine occasionally.  Menopause was at 71 y/o.  No history of HRT.  No family history of osteoporosis.  No h/o hyper/hypocalcemia or hyperparathyroidism. No h/o kidney stones. Lab Results  Component Value Date   CALCIUM 9.6 04/03/2021   CALCIUM 9.2 02/17/2021   CALCIUM 9.6 01/25/2021   CALCIUM 9.8 10/22/2020   CALCIUM 9.8 07/09/2020   CALCIUM 9.9 06/04/2020   CALCIUM 9.5 03/20/2020   CALCIUM 9.6 01/30/2020   CALCIUM 9.3 01/27/2020   CALCIUM 9.1 12/30/2019   No h/o thyrotoxicosis. Reviewed TSH recent levels:  Lab Results  Component Value Date   TSH 1.21 04/03/2021   TSH 0.97 01/25/2021   TSH 1.46 10/22/2020   TSH 1.39 07/09/2020   TSH 1.30 06/04/2020   No h/o CKD. Last BUN/Cr: Lab Results  Component Value Date   BUN 12 04/03/2021   CREATININE 0.88 04/03/2021   She has a history of GERD and is on Omeprazole and Mylanta.  She also has a history of small hiatal hernia.  She was recently found to have an incidental right thyroid nodule on head and neck CT.  Thyroid ultrasound (05/23/2021): Parenchymal Echotexture: Mildly heterogeneous  Isthmus: 0.2 cm  Right lobe: 4.6 x 2.0 x 1.8 cm  Left lobe: 4.4 x 1.5 x 1.4 cm  ___________________________________________________________________________   Nodule # 2:  Location: Right; inferior  Maximum size: 2.1 cm; Other 2 dimensions: 1.6 x 1.4 cm  Composition: solid/almost completely solid (2)  Echogenicity: isoechoic (1)  *Given size (>/= 1.5 - 2.4 cm) and appearance, a follow-up ultrasound in 1 year should be considered based on TI-RADS criteria.  _________________________________________________________   Remaining subcentimeter thyroid nodules do not meet criteria for FNA or imaging  surveillance.   IMPRESSION: Nodule 2 (TI-RADS 3) located in the inferior right thyroid lobe meets criteria for imaging follow-up.   Annual ultrasound surveillance is recommended until 5 years of stability is documented.  She is retired Licensed conveyancer.  ROS: + See HPI  I reviewed pt's medications, allergies, PMH, social hx, family hx, and changes were documented in the history of present illness. Otherwise, unchanged from my initial visit note.  Past Medical History:  Diagnosis Date   Arthritis    cervical and lumbar   Cataract    Mixed OU   Depression    GERD (gastroesophageal reflux disease)    Hyperlipidemia    IBS (irritable bowel syndrome)    Osteoporosis    Prediabetes    Vitamin D deficiency    Past Surgical History:  Procedure Laterality Date   CARDIAC CATHETERIZATION  2011   normal (Dr. Terrence Dupont)   RADIOACTIVE SEED GUIDED EXCISIONAL BREAST BIOPSY Right 03/11/2016   Procedure: RIGHT RADIOACTIVE SEED GUIDED EXCISIONAL BREAST BIOPSY;  Surgeon: Rolm Bookbinder, MD;  Location: [facility];  Service: General;  Laterality: Right;   Social History   Socioeconomic History   Marital status: Married    Spouse name: Not on file   Number of children: 0   Years of education: Not on file   Highest education level: Not on file  Occupational History   Occupation: retired Copywriter, advertising  Tobacco Use   Smoking status: Never    Passive exposure: Never   Smokeless tobacco: Never  Vaping Use   Vaping Use: Never used  Substance and Sexual Activity   Alcohol use: Not Currently   Drug use: No   Sexual activity: Yes    Birth control/protection: Post-menopausal    Comment: 1st intercourse 51 yo-5 partners  Other Topics Concern   Not on file  Social History Narrative   Lives at home with spouse   Right handed   Caffeine: not much, drinks decaf 2 cups/day   Social Determinants of Health   Financial Resource Strain: Not on file  Food Insecurity: Not on  file  Transportation Needs: Not on file  Physical Activity: Not on file  Stress: Not on file  Social Connections: Not on file  Intimate Partner Violence: Not on file   Current Outpatient Medications on File Prior to Visit  Medication Sig Dispense Refill   aspirin 81 MG tablet Take 81 mg by mouth daily.     Bacillus Coagulans-Inulin (ALIGN PREBIOTIC-PROBIOTIC PO) Take 1 tablet by mouth daily.     Calcium Carbonate-Vit D-Min (CALCIUM 1200 PO) Take by mouth.     Cholecalciferol 125 MCG (5000 UT) capsule Take 5,000 Units by mouth daily.     Estradiol 10 MCG TABS vaginal tablet Place one tablet vaginally qhs x 1 week, then change to 2 x a week at hs 24 tablet 4   ezetimibe (ZETIA) 10 MG tablet Take   1 tablet   Daily  for Cholesterol 90 tablet 0   famotidine (PEPCID) 40 MG tablet TAKE 1 TABLET BY MOUTH EVERY DAY IN THE EVENING 30 tablet 5   loratadine (CLARITIN) 10 MG tablet Take 1 tablet 1-2 times daily 60 tablet 5   Lutein 20 MG TABS Take by mouth.     Multiple Vitamins-Minerals (AIRBORNE) CHEW Chew 1 tablet by mouth as needed.     pantoprazole (PROTONIX) 40 MG tablet Take 1 tablet (40 mg total) by mouth in the morning. 60 tablet 5   Polyethylene Glycol 3350 (MIRALAX PO) Take by mouth at bedtime.     RESTASIS 0.05 % ophthalmic emulsion instill 1 drop into both eyes twice a day  0   triamcinolone (NASACORT ALLERGY 24HR) 55 MCG/ACT AERO nasal inhaler 1-2 sprays per nostril 1-7 times per week. 16.5 g 5   No current facility-administered medications on file prior to visit.   Allergies  Allergen Reactions   Bactrim [Sulfamethoxazole-Trimethoprim] Other (See Comments)    Patient preference to take medication without sulfa   Cephalexin Nausea And Vomiting    Rash; chest pain Rash; chest pain   Sulfa Antibiotics    Family History  Problem Relation Age of Onset   Hypertension Mother    Other Mother        surgeries involving upper back   Diabetes Father    Heart failure Father    Cancer  Maternal Grandmother        Lung   Allergic rhinitis Neg Hx    Angioedema Neg Hx    Asthma Neg Hx    Atopy Neg Hx    Eczema Neg Hx    Immunodeficiency Neg Hx    Urticaria Neg Hx    Migraines Neg Hx    PE: BP 118/62 (BP Location: Right Arm, Patient Position: Sitting, Cuff Size: Normal)    Pulse (!) 50    Ht 5' 4.5" (1.638 m)    Wt 139 lb 12.8 oz (63.4 kg)    SpO2 99%    BMI 23.63 kg/m  Wt Readings from Last 3 Encounters:  07/17/21 139 lb 12.8 oz (63.4 kg)  06/25/21 141 lb 9.6 oz (64.2 kg)  04/23/21 140 lb (63.5 kg)   Constitutional: Normal weight, in NAD. No kyphosis. Eyes: PERRLA, EOMI, no exophthalmos ENT: moist mucous membranes, no thyromegaly, no neck masses palpated; no cervical lymphadenopathy Cardiovascular: RRR, No MRG Respiratory: CTA B Gastrointestinal: abdomen soft, NT, ND, BS+ Musculoskeletal: no deformities, strength intact in all 4 Skin: moist, warm, no rashes Neurological: no tremor with outstretched hands, DTR normal in all 4  Assessment: 1. Osteoporosis  2.  Right thyroid nodule  Plan: 1. Osteoporosis -Likely postmenopausal/age-related; no family history of osteoporosis -At last visit I recommended a good amount of protein in her diet, approximately 50 g/day for her weight, and also, I recommended a low acid diet, less than 2 drinks of alcohol a day and no smoking. -At this visit, I again  recommended weightbearing exercises -5 out of 7 days.  She is currently not doing weight-bearing exercises, but she is walking.  Discussed about possibly going to the gym and may be joining a weightbearing exercise class but just using the machines -We reviewed together her latest 2 bone density scan reports.  Based on the T-scores, she has an increased risk for fractures.  This is highest in the spine.  However, her T-scores have improved at the level of the spine between 2019 and 2021 and they were stable at the  hips. -Since the scores are still low, medication treatment is  recommended. As she does have acid reflux, the first treatment options would be Prolia or Reclast.  I explained the mechanism of action and expected benefits and again reviewed possible side effects (including atypical fractures and ONJ) at last visit. I also gave her reading information about Reclast and Prolia and advised her to let me know if she decides to start 1 or the other.  She continues to prefer to manage her osteoporosis without medication.  She did previously have an episode of joint pain/TMJ and I explained that this was unlikely to be related to the 1 dose of oral bisphosphonate that she took in 04/2020. - Since she was interested in nonmedication treatments for osteoporosis, I suggested [place] [facility] for skeletal loading.  She did not start this. - At today's visit we reviewed her most recent vitamin D level, which was normal, at 58, on 04/03/2021.  Of note, we decreased the dose of her vitamin D supplement from 5000 units daily + multivitamin to 2000-3000 units daily. She started on 3000 units daily but in the last month, she increased to 4000 units daily.  We will repeat a vitamin D level today. - Also, her most recent kidney function was normal on 04/03/2021. - will continue bone density scan after 02/13/2022 -she gets them done at [facility].  I advised her to send me a message in August so I can order this - will see pt back in 1 year  2.  Right thyroid nodule -New finding -investigation per PCP -Thyroid ultrasound showed an inferior right thyroid nodule, isoechoic, measuring 2.1 x 1.6 x 1.4 cm. -Recommendation was for follow-up in 1 year -She denies neck compression symptoms -We will keep an eye on the nodule - No intervention needed for now  Component     Latest Ref Rng & Units 07/17/2021  VITD     30.00 - 100.00 ng/mL 93.60  Vitamin D is high in the normal range. I will advise her to decrease the supplement to 3000 units daily.  Philemon Kingdom, MD PhD [facility]  Endocrinology

## 2021-07-23 ENCOUNTER — Other Ambulatory Visit: Payer: Self-pay | Admitting: Allergy and Immunology

## 2021-08-11 NOTE — Progress Notes (Signed)
Future Appointments  Date Time Provider Department  08/12/2021 10:30 AM Unk Pinto, MD [facility]  10/02/2021 10:00 AM Liane Comber, NP [facility]  10/08/2021 11:10 AM Neldon Mc, Donnamarie Poag, MD [facility]  04/03/2022 10:00 AM Unk Pinto, MD [facility]  07/23/2022  9:40 AM Philemon Kingdom, MD [facility]    History of Present Illness:              The patient is a  71 y.o. MBF  with  HTN, HLD, Prediabetes, Osteoporosis, IBS-C,  GERD and Vitamin D Deficiency. Denies recent falls or injury as "whiplash" type injury.    Medications    ezetimibe  10 MG tablet, Take   1 tablet   Daily    loratadine 10 MG tablet, Take 1 tablet 1-2 times daily   triamcinolone  nasal inhaler, 1-2 sprays per nostril 1-7 times per week.   aspirin 81 MG tablet, Take  daily.   Bacillus Coagulans-Inulin (ALIGN PREBIOTIC), Take 1 tablet daily.   n (CALCIUM 1200 ), Take daily   Vitamin D5000  u, Take 5,000 Units daily.   Estradiol 10 MCG vaginal tablet, Place one tablet  2 x a week at hs   famotidine  40 MG , TAKE 1 TABLET  EVERY DAY IN THE EVENING   Lutein 20 MG TABS, Take daily    Multiple Vitamins-Minerals (AIRBORNE) , 1 tablet  as needed.   pantoprazole 40 MG tablet, Take 1 tablet in the morning.   Polyethylene Glycol , Take at bedtime.   RESTASIS 0.05 % ophth emulsion, instill 1 drop into both eyes twice a day  Problem list She has GERD ; Vitamin D deficiency; Hyperlipidemia, mixed; Labile hypertension; Abnormal glucose; Osteoporosis; Bigeminy; DOE ; Upper airway cough syndrome; Fear of side effects of medication; Environmental allergies; Abnormal weight loss; Cholesterolosis of gallbladder; Chronic idiopathic constipation; Irritable bowel syndrome with constipation; Costochondritis; Nausea; Rectal bleeding; Pain in right knee; Left temporal headache; and Numbness and tingling of left leg on their problem list. ( Patient refuses meds for her osteoporosis & Hyperlipidemia )    Observations/Objective:  BP 100/60    Pulse 62    Temp 97.8 F (36.6 C)    Resp 16    Ht 5\' 5"  (1.651 m)    Wt 139 lb 9.6 oz (63.3 kg)    SpO2 98%    BMI 23.23 kg/m   HEENT - WNL. N/O/P clear.  Neck - supple. Full ROM.  Carotids 2+ /2+ w/o bruits, nodes, JVD or abnormal masses. (+) slight tenderness of both sternocleidomastoid muscles.  Chest - Clear equal BS. Cor - Nl HS. RRR w/o sig MGR. PP 1(+). No edema. MS- FROM w/o deformities.  Gait Nl. Neuro -  Nl w/o focal abnormalities. Skin - clear w/o rashes.   Assessment and Plan:  1. Neck pain  - dexamethasone  2 MG tablet;  Take 1 tab 3 x day for 3 days, then 2 x day for 3 days, then 1 tab daily   Dispense: 20 tablet   Follow Up Instructions:       I discussed the assessment and treatment plan with the patient. The patient was provided an opportunity to ask questions and all were answered. The patient agreed with the plan and demonstrated an understanding of the instructions.       The patient was advised to call back or seek an in-person evaluation if the symptoms worsen or if the condition fails to improve as anticipated.   Kirtland Bouchard,  MD

## 2021-08-12 ENCOUNTER — Other Ambulatory Visit: Payer: Self-pay

## 2021-08-12 ENCOUNTER — Ambulatory Visit: Payer: Medicare Other | Admitting: Internal Medicine

## 2021-08-12 ENCOUNTER — Encounter: Payer: Self-pay | Admitting: Internal Medicine

## 2021-08-12 VITALS — BP 100/60 | HR 62 | Temp 97.8°F | Resp 16 | Ht 65.0 in | Wt 139.6 lb

## 2021-08-12 DIAGNOSIS — M542 Cervicalgia: Secondary | ICD-10-CM

## 2021-08-12 MED ORDER — DEXAMETHASONE 2 MG PO TABS: ORAL_TABLET | ORAL | 0 refills | Status: DC

## 2021-08-12 NOTE — Patient Instructions (Signed)
Cervical Sprain A cervical sprain is a stretch or tear in one or more of the ligaments in the neck. Ligaments are the tissues that connect bones. Cervical sprains can range from mild to severe. Severe cervical sprains can cause the spinal bones (vertebrae) in the neck to be unstable. This can result in spinal cord damage and in serious nervous system problems. The time that it takes for a cervical sprain to heal depends on the cause and extent of the injury. Most cervical sprains heal in 4-6 weeks. What are the causes? Cervical sprains may be caused by trauma, such as an injury from a motor vehicle accident, a fall, or a sudden forward and backward whipping movement of the head and neck (whiplash injury). Mild cervical sprains may be caused by wear and tear over time. What increases the risk? The following factors may make you more likely to develop this condition: Participating in activities that have a high risk of trauma to the neck. These include contact sports, auto racing, gymnastics, and diving. Taking risks when driving or riding in a motor vehicle. Osteoarthritis of the spine. Poor strength and flexibility of the neck. A previous neck injury. Poor posture. Spending long periods in certain positions that put stress on the neck, such as sitting at a computer for a long time. What are the signs or symptoms? Symptoms of this condition include: Pain, soreness, stiffness, tenderness, swelling, or a burning sensation in the front, back, or sides of the neck, shoulders, or upper back. Sudden tightening of neck muscles (spasms). Limited ability to move the neck. Headache. Dizziness. Nausea or vomiting. Weakness, numbness, or tingling in a hand or an arm. Symptoms may develop right away after injury, or they may develop over a few days. In some cases, symptoms may go away with treatment and return (recur) over time. How is this diagnosed? This condition may be diagnosed based on: Your  medical history. Your symptoms. Any recent injuries or known neck problems that you have, such as arthritis in the neck. A physical exam. Imaging tests, such as X-rays, MRI, and CT scan. How is this treated? This condition is treated by resting and icing the injured area and doing physical therapy exercises. Heat therapy may be used 2-3 days after the injury occurred if there is no swelling. Depending on the severity of your condition, treatment may also include: Keeping your neck in place (immobilized) for periods of time. This may be done using: A cervical collar. This supports your chin and the back of your head. A cervical traction device. This is a sling that holds up your head. The device removes weight and pressure from your neck, and it may help to relieve pain. Medicines that help to relieve pain and inflammation. Medicines that help to relax your muscles (muscle relaxants). Surgery. This is rare. Follow these instructions at home: Medicine Take over-the-counter and prescription medicines only as told by your health care provider. Ask your health care provider if the medicine prescribed to you: Requires you to avoid driving or using heavy machinery. Can cause constipation. You may need to take these actions to prevent or treat constipation: Drink enough fluid to keep your urine pale yellow. Take over-the-counter or prescription medicines. Eat foods that are high in fiber, such as beans, whole grains, and fresh fruits and vegetables. Limit foods that are high in fat and processed sugars, such as fried or sweet foods. If you have a cervical collar: Wear the collar as told by your health  care provider. Do not remove it unless told. Ask before making any adjustments to your collar. If you have long hair, keep it outside of the collar. Ask your health care provider if you may remove the collar for cleaning and bathing. If so: Follow instructions about how to remove it safely. Clean  it by hand with mild soap and water and air-dry it completely. If your collar has removable pads, remove them every 1-2 days and wash them by hand with soap and water. Let them air-dry completely before putting them back in the collar. Tell your health care provider if your skin under the collar has irritation or sores. Managing pain, stiffness, and swelling If directed, use a cervical traction device as told. If directed, put ice on the affected area. To do this: Put ice in a plastic bag. Place a towel between your skin and the bag. Leave the ice on for 20 minutes, 2-3 times a day. If directed, apply heat to the affected area before you do your physical therapy or as often as told by your health care provider. Use the heat source that your health care provider recommends, such as a moist heat pack or a heating pad. Place a towel between your skin and the heat source. Leave the heat on for 20-30 minutes. Remove the heat if your skin turns bright red. This is especially important if you are unable to feel pain, heat, or cold. You may have a greater risk of getting burned. Activity Do not drive while wearing a cervical collar. If you do not have a cervical collar, ask if it is safe to drive while your neck heals. Do not lift anything that is heavier than 10 lb (4.5 kg), or the limit that you are told, until your health care provider says that it is safe. Rest as told by your health care provider. If physical therapy was prescribed, do exercises as told by your health care provider or physical therapist. Return to your normal activities as told by your health care provider. Avoid positions and activities that make your symptoms worse. Ask your health care provider what activities are safe for you. General instructions Do not use any products that contain nicotine or tobacco, such as cigarettes, e-cigarettes, and chewing tobacco. These can delay healing. If you need help quitting, ask your health  care provider. Keep all follow-up visits as told by your health care provider or physical therapist. This is important. How is this prevented? To prevent a cervical sprain from happening again: Use and maintain good posture. Make any needed adjustments to your workstation to help you do this. Exercise regularly as told by your health care provider or physical therapist. Avoid risky activities that may cause a cervical sprain. Contact a health care provider if you have: Symptoms that get worse or do not get better after 2 weeks of treatment. Pain that gets worse or does not get better with medicine. New, unexplained symptoms. Sores or irritated skin on your neck from wearing your cervical collar. Get help right away if: You have severe pain. You develop numbness, tingling, or weakness in any part of your body. You cannot move a part of your body (you have paralysis). You have neck pain along with severe dizziness or headache. Summary A cervical sprain is a stretch or tear in one or more of the ligaments in the neck. Cervical sprains may be caused by trauma, such as an injury from a motor vehicle accident, a fall, or a  sudden forward and backward whipping movement of the head and neck (whiplash injury). Symptoms may develop right away after injury, or they may develop over a few days. This condition may be treated with rest, ice, heat, medicines, physical therapy, and surgery.

## 2021-09-02 NOTE — Progress Notes (Signed)
GYNECOLOGY  VISIT ?  ?HPI: ?71 y.o.   Married Black or [place] American Not Hispanic or Latino  female   ?[id number] with No LMP recorded. Patient is postmenopausal.   ?here for cramping. She c/o a 2 month h/o lower abdominal cramping.  ?H/O IBS, taking miralax. Having a BM 1-2 x a day currently. A few weeks ago she was having them 3 x a day, she decreased her miralax and it is helping. S ?he has gas and feels bloated. Her pain is in BLQ to her groin and upper thighs. The worse discomfort is in the region of her ascending, transverse and descending colon. The pain is worse with eating and may be a little better after BM. She is seeing her GI and having an endoscopy, but is worried about a possible GYN source of her pain. ?No vaginal bleeding.  ? ?I have reviewed her office visit with GI on 08/27/21. She was seen for LLQ abdominal pain, IBS-C and GERD. She is scheduled for an endoscopy next week. She was started on levbid and levsin.  ? ?GYNECOLOGIC HISTORY: ?No LMP recorded. Patient is postmenopausal. ?Contraception:PMP ?Menopausal hormone therapy: none ?       ?OB History   ? ? Gravida  ?0  ? Para  ?0  ? Term  ?0  ? Preterm  ?0  ? AB  ?0  ? Living  ?0  ?  ? ? SAB  ?0  ? IAB  ?0  ? Ectopic  ?0  ? Multiple  ?0  ? Live Births  ?0  ?   ?  ?  ?    ? ?Patient Active Problem List  ? Diagnosis Date Noted  ? Numbness and tingling of left leg 06/26/2021  ? Left temporal headache 06/25/2021  ? Abnormal weight loss 08/27/2020  ? 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 Chronic idiopathic constipation 08/27/2020  ? Costochondritis 08/27/2020  ? Nausea 08/27/2020  ? Rectal bleeding 08/27/2020  ? Environmental allergies 04/24/2020  ? Irritable bowel syndrome with constipation 09/02/2019  ? Fear of side effects of medication 08/28/2019  ? Upper airway cough syndrome 05/04/2019  ? DOE (dyspnea on exertion) 05/03/2019  ? Pain in right knee 12/27/2018  ? Bigeminy 03/25/2018  ? Osteoporosis 08/11/2017  ? Abnormal glucose 06/25/2017  ?  Medication management 12/12/2014  ? 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 GERD (gastroesophageal reflux disease)   ? Vitamin D deficiency   ? Hyperlipidemia, mixed   ? ? ?Past Medical History:  ?Diagnosis Date  ? 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 Cataract   ? Mixed OU  ? Depression   ? GERD (gastroesophageal reflux disease)   ? Hyperlipidemia   ? IBS (irritable bowel syndrome)   ? Osteoporosis   ? Prediabetes   ? Vitamin D deficiency   ? ? ?Past Surgical History:  ?Procedure Laterality Date  ? CARDIAC CATHETERIZATION  2011  ? normal (Dr. Terrence Dupont)  ? RADIOACTIVE SEED GUIDED EXCISIONAL BREAST BIOPSY Right 03/11/2016  ? Procedure: RIGHT RADIOACTIVE SEED GUIDED EXCISIONAL BREAST BIOPSY;  Surgeon: Rolm Bookbinder, MD;  Location: [facility];  Service: General;  Laterality: Right;  ? ? ?Current Outpatient Medications  ?Medication Sig Dispense Refill  ? aspirin 81 MG tablet Take 81 mg by mouth daily.    ? Bacillus Coagulans-Inulin (ALIGN PREBIOTIC-PROBIOTIC PO) Take 1 tablet by mouth daily.    ? Calcium Carbonate-Vit D-Min (CALCIUM 1200 PO) Take by mouth.    ? Cholecalciferol 125 MCG (  5000 UT) capsule Take 5,000 Units by mouth daily.    ? dexamethasone (DECADRON) 2 MG tablet Take 1 tab 3 x day for 3 days, then 2 x day for 3 days, then 1 tab daily 20 tablet 0  ? ezetimibe (ZETIA) 10 MG tablet Take   1 tablet   Daily for Cholesterol 90 tablet 0  ? famotidine (PEPCID) 40 MG tablet TAKE 1 TABLET BY MOUTH EVERY DAY IN THE EVENING 90 tablet 2  ? Hyoscyamine Sulfate SL 0.125 MG SUBL Take by mouth.    ? loratadine (CLARITIN) 10 MG tablet Take 1 tablet 1-2 times daily 60 tablet 5  ? Lutein 20 MG TABS Take by mouth.    ? Magnesium 250 MG TABS     ? Multiple Vitamins-Minerals (AIRBORNE) CHEW Chew 1 tablet by mouth as needed.    ? pantoprazole (PROTONIX) 40 MG tablet Take 1 tablet (40 mg total) by mouth in the morning. 60 tablet 5  ? Polyethylene Glycol 3350 (MIRALAX PO) Take by mouth at bedtime.    ? RESTASIS 0.05 %  ophthalmic emulsion instill 1 drop into both eyes twice a day  0  ? triamcinolone (NASACORT ALLERGY 24HR) 55 MCG/ACT AERO nasal inhaler 1-2 sprays per nostril 1-7 times per week. 16.5 g 5  ? Estradiol 10 MCG TABS vaginal tablet Place one tablet vaginally qhs x 1 week, then change to 2 x a week at hs (Patient not taking: Reported on 09/06/2021) 24 tablet 4  ? ?No current facility-administered medications for this visit.  ?  ? ?ALLERGIES: Bactrim [sulfamethoxazole-trimethoprim], Cephalexin, and Sulfa antibiotics ? ?Family History  ?Problem Relation Age of Onset  ? Hypertension Mother   ? Other Mother   ?     surgeries involving upper back  ? Diabetes Father   ? Heart failure Father   ? Cancer Maternal Grandmother   ?     Lung  ? Allergic rhinitis Neg Hx   ? Angioedema Neg Hx   ? Asthma Neg Hx   ? Atopy Neg Hx   ? Eczema Neg Hx   ? Immunodeficiency Neg Hx   ? Urticaria Neg Hx   ? Migraines Neg Hx   ? ? ?Social History  ? ?Socioeconomic History  ? Marital status: Married  ?  Spouse name: Not on file  ? Number of children: 0  ? Years of education: Not on file  ? Highest education level: Not on file  ?Occupational History  ? Occupation: retired Copywriter, advertising  ?Tobacco Use  ? Smoking status: Never  ?  Passive exposure: Never  ? Smokeless tobacco: Never  ?Vaping Use  ? Vaping Use: Never used  ?Substance and Sexual Activity  ? Alcohol use: Not Currently  ? Drug use: No  ? Sexual activity: Yes  ?  Birth control/protection: Post-menopausal  ?  Comment: 1st intercourse 65 yo-5 partners  ?Other Topics Concern  ? Not on file  ?Social History Narrative  ? Lives at home with spouse  ? Right handed  ? Caffeine: not much, drinks decaf 2 cups/day  ? ?Social Determinants of Health  ? ?Financial Resource Strain: Not on file  ?Food Insecurity: Not on file  ?Transportation Needs: Not on file  ?Physical Activity: Not on file  ?Stress: Not on file  ?Social Connections: Not on file  ?Intimate Partner Violence: Not on file   ? ? ?ROS ? ?PHYSICAL EXAMINATION:   ? ?BP 116/70     ?General appearance: alert, cooperative and  appears stated age ?Abdomen: soft, mildly tender in her mid abdomen, no rebound, no guarding, moderately distended, no masses,  no organomegaly ? ?Pelvic: External genitalia:  no lesions ?             Urethra:  normal appearing urethra with no masses, tenderness or lesions ?             Bartholins and Skenes: normal    ?             Vagina: normal appearing vagina with normal color and discharge, no lesions ?             Cervix: no cervical motion tenderness and no lesions ?             Bimanual Exam:  Uterus:  normal size, contour, position, consistency, mobility, non-tender ?             Adnexa: no mass, fullness, tenderness ?             Rectovaginal: Yes.  .  Confirms. ?             Anus:  normal sphincter tone, no lesions ? ?Chaperone was present for exam. ? ?1. Combined abdominal and pelvic pain ?I suspect a GI source, the patient is concerned. Will set up a GYN ultrasound ?- US PELVIS TRANSVAGINAL NON-OB (TV ONLY); Future ?-She is f/u with GI next week ? ?2. Bloating ?- US PELVIS TRANSVAGINAL NON-OB (TV ONLY); Future ? ?

## 2021-09-06 ENCOUNTER — Other Ambulatory Visit: Payer: Self-pay | Admitting: Obstetrics and Gynecology

## 2021-09-06 ENCOUNTER — Encounter: Payer: Self-pay | Admitting: Obstetrics and Gynecology

## 2021-09-06 ENCOUNTER — Other Ambulatory Visit: Payer: Self-pay

## 2021-09-06 ENCOUNTER — Ambulatory Visit: Payer: Medicare Other | Admitting: Obstetrics and Gynecology

## 2021-09-06 VITALS — BP 116/70

## 2021-09-06 DIAGNOSIS — R109 Unspecified abdominal pain: Secondary | ICD-10-CM | POA: Diagnosis not present

## 2021-09-06 DIAGNOSIS — R102 Pelvic and perineal pain: Secondary | ICD-10-CM

## 2021-09-06 DIAGNOSIS — R140 Abdominal distension (gaseous): Secondary | ICD-10-CM

## 2021-10-01 NOTE — Progress Notes (Signed)
? MEDICARE ANNUAL WELLNESS VISIT AND FOLLOW UP ? ?Assessment:  ? ? ?Annual Medicare Wellness Visit ?Due annually  ?Health maintenance reviewed ? ?Labile hypertension ?- continue medications, DASH diet, exercise and monitor at home. Call if greater than 130/80.  ?-     CBC with Differential/Platelet ?-     COMPLETE METABOLIC PANEL WITH GFR ?-     TSH ? ?Hyperlipidemia, mixed ?-     Lipid panel ?check lipids ?decrease fatty foods ?increase activity.  ? ?Abnormal glucose ?-     Hemoglobin A1c ? ?Vitamin D deficiency ?Continue supplement ? ?Medication management ?CBC, CMP/GFR, magnesium  ? ?Osteoporosis, unspecified osteoporosis type, unspecified pathological fracture presence ?Dr. Cruzita Lederer follow  ? get dexa q2y, continue Vit D and Ca, weight bearing exercises ? ?Gastroesophageal reflux disease, unspecified whether esophagitis present ?Continue PPI/H2 blocker, diet discussed ? ?Upper airway cough syndrome  ?Continue H2i and PPI; lifstyhle reviewed; avoid triggers ? ?IBS-C ?Continue miralax; soluble fiber encouraged ? ?Fear of side effects of medication ?Continue counseling ? ?Bigeminy ?Monitor ? ?Need for pneumonia vaccination ?- 23 valent pneumococcal vaccine administered without complication today  ? ? ?Orders Placed This Encounter  ?Procedures  ? Pneumococcal polysaccharide vaccine 23-valent greater than or equal to 2yo subcutaneous/IM  ? CBC with Differential/Platelet  ? COMPLETE METABOLIC PANEL WITH GFR  ? Magnesium  ? Lipid panel  ? TSH  ? ? ?Over 40 minutes of exam, counseling, chart review and critical decision making was performed ?Future Appointments  ?Date Time Provider [place]  ?10/08/2021 11:10 AM Kozlow, Donnamarie Poag, MD [facility] None  ?10/08/2021  1:30 PM [facility] [place] [facility] None  ?10/08/2021  2:00 PM Salvadore Dom, MD [facility] None  ?04/03/2022 10:00 AM Unk Pinto, MD [facility] None  ?07/23/2022  9:40 AM Philemon Kingdom, MD [facility] None  ?10/03/2022 10:00 AM Liane Comber,  NP [facility] None  ? ? ? ?Plan:  ? ?During the course of the visit the patient was educated and counseled about appropriate screening and preventive services including:  ? ?Pneumococcal vaccine  ?Prevnar 13 ?Influenza vaccine ?Td vaccine ?Screening electrocardiogram ?Bone densitometry screening ?Colorectal cancer screening ?Diabetes screening ?Glaucoma screening ?Nutrition counseling  ?Advanced directives: requested ? ? ?Subjective:  ?[name] is a 71 y.o. female who presents for Medicare Annual Wellness Visit and 3 month follow up. She has GERD (gastroesophageal reflux disease); Vitamin D deficiency; Hyperlipidemia, mixed; Labile hypertension; Medication management; Abnormal glucose; Osteoporosis; Bigeminy; Upper airway cough syndrome; Fear of side effects of medication; Environmental allergies; Cholesterolosis of gallbladder; Irritable bowel syndrome with constipation; and Pain in right knee on their problem list. ? ?Patient is followed by Dr Earlean Shawl for IBS-C and GERD.  ?She has been seen by Dr. Melvyn Novas and Dr. Neldon Mc for upper airway cough syndrome and recommended omeprazole 40 mg in AM, famotidine 40 mg in PM. Patient reports she recently had EGD by Dr. Watt Climes in 09/12/2021 that showed gastritis, currently taking pantoprazole 40 mg BID.  ? ?Patient is also followed by Dr Cruzita Lederer for Osteoporosis, has been declining medications. Last DEXA in 2021 did show lumbar improved T score -3.3, up from -3.6.   ? ?BMI is Body mass index is 23.13 kg/m?., she is working on diet and exercise, walking 45 min 3-5 days a week.  ?Wt Readings from Last 3 Encounters:  ?10/02/21 139 lb (63 kg)  ?08/12/21 139 lb 9.6 oz (63.3 kg)  ?07/17/21 139 lb 12.8 oz (63.4 kg)  ? ?She had a normal heart echo  and Heart cath in 2011 (Dr Terrence Dupont).   ?Negative Myoview 08/29/2020 - Dr Terrence Dupont.  ? ? Her blood pressure has been controlled at home, today their BP is BP: (!) 92/58 ?She does workout .She denies chest pain, shortness of breath, dizziness.   ? ?She is not on cholesterol medication (zetia 10 mg daily, declines statins) and denies myalgias. Her cholesterol is not at goal. The cholesterol last visit was:   ?Lab Results  ?Component Value Date  ? CHOL 193 04/03/2021  ? HDL 73 04/03/2021  ? LDLCALC 106 (H) 04/03/2021  ? TRIG 59 04/03/2021  ? CHOLHDL 2.6 04/03/2021  ? ? Last A1C in the office was:  ?Lab Results  ?Component Value Date  ? HGBA1C 5.6 04/03/2021  ? ?Last GFR: ?Lab Results  ?Component Value Date  ? EGFR 72 04/03/2021  ? ?Patient is on Vitamin D supplement, on 5000 IU.   ?Lab Results  ?Component Value Date  ? VD25OH 93.60 07/17/2021  ?   ? ? ?Medication Review: ? ? ?Current Outpatient Medications (Cardiovascular):  ?  ezetimibe (ZETIA) 10 MG tablet, Take   1 tablet   Daily for Cholesterol ? ?Current Outpatient Medications (Respiratory):  ?  loratadine (CLARITIN) 10 MG tablet, Take 1 tablet 1-2 times daily ?  triamcinolone (NASACORT ALLERGY 24HR) 55 MCG/ACT AERO nasal inhaler, 1-2 sprays per nostril 1-7 times per week. ? ?Current Outpatient Medications (Analgesics):  ?  aspirin 81 MG tablet, Take 81 mg by mouth daily. ? ? ?Current Outpatient Medications (Other):  ?  Bacillus Coagulans-Inulin (ALIGN PREBIOTIC-PROBIOTIC PO), Take 1 tablet by mouth daily. ?  Calcium Carbonate-Vit D-Min (CALCIUM 1200 PO), Take by mouth. ?  Cholecalciferol 125 MCG (5000 UT) capsule, Take 5,000 Units by mouth daily. ?  Hyoscyamine Sulfate (HYOSCYAMINE PO)*, Take by mouth as needed. ?  Lutein 20 MG TABS, Take by mouth. ?  Magnesium 250 MG TABS,  ?  Multiple Vitamins-Minerals (AIRBORNE) CHEW, Chew 1 tablet by mouth as needed. ?  pantoprazole (PROTONIX) 40 MG tablet, Take 1 tablet (40 mg total) by mouth in the morning. ?  Polyethylene Glycol 3350 (MIRALAX PO), Take by mouth at bedtime. ?  RESTASIS 0.05 % ophthalmic emulsion, instill 1 drop into both eyes twice a day ?  famotidine (PEPCID) 40 MG tablet, TAKE 1 TABLET BY MOUTH EVERY DAY IN THE EVENING (Patient not taking:  Reported on 10/02/2021) ?* These medications belong to multiple therapeutic classes and are listed under each applicable group. ? ? ?Allergies  ?Allergen Reactions  ? Bactrim [Sulfamethoxazole-Trimethoprim] Other (See Comments)  ?  Patient preference to take medication without sulfa  ? Cephalexin Nausea And Vomiting  ?  Rash; chest pain ?Rash; chest pain  ? Sulfa Antibiotics   ? ? ?Current Problems (verified) ?Patient Active Problem List  ? Diagnosis Date Noted  ? 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 Environmental allergies 04/24/2020  ? Irritable bowel syndrome with constipation 09/02/2019  ? Fear of side effects of medication 08/28/2019  ? Upper airway cough syndrome 05/04/2019  ? Pain in right knee 12/27/2018  ? Bigeminy 03/25/2018  ? Osteoporosis 08/11/2017  ? Abnormal glucose 06/25/2017  ? Medication management 12/12/2014  ? 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 GERD (gastroesophageal reflux disease)   ? Vitamin D deficiency   ? Hyperlipidemia, mixed   ? ? ?Screening Tests ?Immunization History  ?Administered Date(s) Administered  ? Influenza, High Dose Seasonal PF 06/25/2017  ? Influenza-Unspecified 03/24/2018, 03/29/2019, 03/05/2020  ? PFIZER Comirnaty(Gray Top)Covid-19 Tri-Sucrose Vaccine 12/26/2020  ?  PFIZER(Purple Top)SARS-COV-2 Vaccination 07/08/2019, 07/29/2019, 03/20/2020  ? PPD Test 12/12/2014, 12/26/2015, 01/15/2017  ? Pneumococcal Conjugate-13 11/10/2018  ? Td 05/18/2007  ? Zoster, Live 05/17/2006  ? ?Health Maintenance  ?Topic Date Due  ? Zoster Vaccines- Shingrix (1 of 2) Never done  ? Pneumonia Vaccine 28+ Years old (2 - PPSV23 if available, else PCV20) 11/10/2019  ? COVID-19 Vaccine (5 - Booster for Pfizer series) 02/20/2021  ? INFLUENZA VACCINE  01/14/2022  ? DEXA SCAN  02/13/2022  ? MAMMOGRAM  03/08/2022  ? COLONOSCOPY (Pts 45-13yr Insurance coverage will need to be confirmed)  10/17/2028  ? TETANUS/TDAP  03/28/2029  ? Hepatitis C Screening  Completed  ? HPV VACCINES  Aged Out  ? ?Last  colonoscopy: 10/2018 [facility], 10 year recall  ? ?[facility]: [facility]?Last pap smear/pelvic exam: follows with [facility]?DEXA: 01/2020 lumbar T score -3.3 (improved from -3.6 in 2019), Dr

## 2021-10-02 ENCOUNTER — Encounter: Payer: Self-pay | Admitting: Adult Health

## 2021-10-02 ENCOUNTER — Ambulatory Visit: Payer: Medicare Other | Admitting: Adult Health

## 2021-10-02 VITALS — BP 92/58 | HR 59 | Temp 97.7°F | Wt 139.0 lb

## 2021-10-02 DIAGNOSIS — R7309 Other abnormal glucose: Secondary | ICD-10-CM

## 2021-10-02 DIAGNOSIS — K219 Gastro-esophageal reflux disease without esophagitis: Secondary | ICD-10-CM

## 2021-10-02 DIAGNOSIS — E782 Mixed hyperlipidemia: Secondary | ICD-10-CM

## 2021-10-02 DIAGNOSIS — K5904 Chronic idiopathic constipation: Secondary | ICD-10-CM

## 2021-10-02 DIAGNOSIS — R058 Other specified cough: Secondary | ICD-10-CM

## 2021-10-02 DIAGNOSIS — Z0001 Encounter for general adult medical examination with abnormal findings: Secondary | ICD-10-CM

## 2021-10-02 DIAGNOSIS — Z0000 Encounter for general adult medical examination without abnormal findings: Secondary | ICD-10-CM

## 2021-10-02 DIAGNOSIS — Z23 Encounter for immunization: Secondary | ICD-10-CM

## 2021-10-02 DIAGNOSIS — Z9109 Other allergy status, other than to drugs and biological substances: Secondary | ICD-10-CM

## 2021-10-02 DIAGNOSIS — E559 Vitamin D deficiency, unspecified: Secondary | ICD-10-CM

## 2021-10-02 DIAGNOSIS — K581 Irritable bowel syndrome with constipation: Secondary | ICD-10-CM | POA: Diagnosis not present

## 2021-10-02 DIAGNOSIS — R0989 Other specified symptoms and signs involving the circulatory and respiratory systems: Secondary | ICD-10-CM

## 2021-10-02 DIAGNOSIS — Z79899 Other long term (current) drug therapy: Secondary | ICD-10-CM | POA: Diagnosis not present

## 2021-10-02 DIAGNOSIS — M810 Age-related osteoporosis without current pathological fracture: Secondary | ICD-10-CM

## 2021-10-02 DIAGNOSIS — R6889 Other general symptoms and signs: Secondary | ICD-10-CM

## 2021-10-02 DIAGNOSIS — F40298 Other specified phobia: Secondary | ICD-10-CM

## 2021-10-02 NOTE — Patient Instructions (Signed)
Suggest pilates or other weight bearing exercises 2-3 days a week for bone density health  ? ? ?Zoster Vaccine, Recombinant injection ?What is this medication? ?ZOSTER VACCINE (ZOS ter vak SEEN) is a vaccine used to reduce the risk of getting shingles. This vaccine is not used to treat shingles or nerve pain from shingles. ?This medicine may be used for other purposes; ask your health care provider or pharmacist if you have questions. ?COMMON BRAND NAME(S): SHINGRIX ?What should I tell my care team before I take this medication? ?They need to know if you have any of these conditions: ?cancer ?immune system problems ?an unusual or allergic reaction to Zoster vaccine, other medications, foods, dyes, or preservatives ?pregnant or trying to get pregnant ?breast-feeding ?How should I use this medication? ?This vaccine is injected into a muscle. It is given by a health care provider. ?A copy of Vaccine Information Statements will be given before each vaccination. Be sure to read this information carefully each time. This sheet may change often. ?Talk to your health care provider about the use of this vaccine in children. This vaccine is not approved for use in children. ?Overdosage: If you think you have taken too much of this medicine contact a poison control center or emergency room at once. ?NOTE: This medicine is only for you. Do not share this medicine with others. ?What if I miss a dose? ?Keep appointments for follow-up (booster) doses. It is important not to miss your dose. Call your health care provider if you are unable to keep an appointment. ?What may interact with this medication? ?medicines that suppress your immune system ?medicines to treat cancer ?steroid medicines like prednisone or cortisone ?This list may not describe all possible interactions. Give your health care provider a list of all the medicines, herbs, non-prescription drugs, or dietary supplements you use. Also tell them if you smoke, drink  alcohol, or use illegal drugs. Some items may interact with your medicine. ?What should I watch for while using this medication? ?Visit your health care provider regularly. ?This vaccine, like all vaccines, may not fully protect everyone. ?What side effects may I notice from receiving this medication? ?Side effects that you should report to your doctor or health care professional as soon as possible: ?allergic reactions (skin rash, itching or hives; swelling of the face, lips, or tongue) ?trouble breathing ?Side effects that usually do not require medical attention (report these to your doctor or health care professional if they continue or are bothersome): ?chills ?headache ?fever ?nausea ?pain, redness, or irritation at site where injected ?tiredness ?vomiting ?This list may not describe all possible side effects. Call your doctor for medical advice about side effects. You may report side effects to FDA at 1-800-FDA-1088. ?Where should I keep my medication? ?This vaccine is only given by a health care provider. It will not be stored at home. ?NOTE: This sheet is a summary. It may not cover all possible information. If you have questions about this medicine, talk to your doctor, pharmacist, or health care provider. ?? 2023 Elsevier/Gold Standard (2021-05-03 00:00:00) ? ?

## 2021-10-03 ENCOUNTER — Encounter: Payer: Self-pay | Admitting: Adult Health

## 2021-10-03 ENCOUNTER — Other Ambulatory Visit: Payer: Self-pay | Admitting: Adult Health

## 2021-10-03 ENCOUNTER — Encounter: Payer: Self-pay | Admitting: Internal Medicine

## 2021-10-03 DIAGNOSIS — D709 Neutropenia, unspecified: Secondary | ICD-10-CM

## 2021-10-03 LAB — LIPID PANEL
Cholesterol: 199 mg/dL
HDL: 84 mg/dL
LDL Cholesterol (Calc): 101 mg/dL — ABNORMAL HIGH
Non-HDL Cholesterol (Calc): 115 mg/dL
Total CHOL/HDL Ratio: 2.4 (calc)
Triglycerides: 55 mg/dL

## 2021-10-03 LAB — CBC WITH DIFFERENTIAL/PLATELET
Absolute Monocytes: 224 cells/uL (ref 200–950)
Basophils Absolute: 10 cells/uL (ref 0–200)
Basophils Relative: 0.5 %
Eosinophils Absolute: 40 cells/uL (ref 15–500)
Eosinophils Relative: 2 %
HCT: 36.6 % (ref 35.0–45.0)
Hemoglobin: 12.3 g/dL (ref 11.7–15.5)
Lymphs Abs: 1072 cells/uL (ref 850–3900)
MCH: 29.1 pg (ref 27.0–33.0)
MCHC: 33.6 g/dL (ref 32.0–36.0)
MCV: 86.7 fL (ref 80.0–100.0)
MPV: 11.8 fL (ref 7.5–12.5)
Monocytes Relative: 11.2 %
Neutro Abs: 654 cells/uL — ABNORMAL LOW (ref 1500–7800)
Neutrophils Relative %: 32.7 %
Platelets: 178 10*3/uL (ref 140–400)
RBC: 4.22 10*6/uL (ref 3.80–5.10)
RDW: 13.2 % (ref 11.0–15.0)
Total Lymphocyte: 53.6 %
WBC: 2 10*3/uL — ABNORMAL LOW (ref 3.8–10.8)

## 2021-10-03 LAB — COMPLETE METABOLIC PANEL WITH GFR
AG Ratio: 1.5 (calc) (ref 1.0–2.5)
ALT: 16 U/L (ref 6–29)
AST: 19 U/L (ref 10–35)
Albumin: 4 g/dL (ref 3.6–5.1)
Alkaline phosphatase (APISO): 69 U/L (ref 37–153)
BUN: 10 mg/dL (ref 7–25)
CO2: 28 mmol/L (ref 20–32)
Calcium: 9.7 mg/dL (ref 8.6–10.4)
Chloride: 104 mmol/L (ref 98–110)
Creat: 0.75 mg/dL (ref 0.50–1.05)
Globulin: 2.6 g/dL (calc) (ref 1.9–3.7)
Glucose, Bld: 87 mg/dL (ref 65–99)
Potassium: 4.6 mmol/L (ref 3.5–5.3)
Sodium: 140 mmol/L (ref 135–146)
Total Bilirubin: 0.5 mg/dL (ref 0.2–1.2)
Total Protein: 6.6 g/dL (ref 6.1–8.1)
eGFR: 86 mL/min/{1.73_m2} (ref >=60)

## 2021-10-03 LAB — TSH: TSH: 1.3 mIU/L (ref 0.40–4.50)

## 2021-10-03 LAB — MAGNESIUM: Magnesium: 2.3 mg/dL (ref 1.5–2.5)

## 2021-10-04 ENCOUNTER — Telehealth: Payer: Self-pay | Admitting: Adult Health

## 2021-10-04 ENCOUNTER — Encounter: Payer: Self-pay | Admitting: Adult Health

## 2021-10-04 NOTE — Telephone Encounter (Signed)
Pt got the Pneumonia vax on Wednesday and has noticed a red rash at the injection site and soreness radiating from her shoulder down to her arm. Please advise  ?

## 2021-10-08 ENCOUNTER — Ambulatory Visit: Payer: Medicare Other

## 2021-10-08 ENCOUNTER — Encounter: Payer: Self-pay | Admitting: Obstetrics and Gynecology

## 2021-10-08 ENCOUNTER — Other Ambulatory Visit: Payer: Medicare Other | Admitting: Obstetrics and Gynecology

## 2021-10-08 ENCOUNTER — Ambulatory Visit: Payer: Medicare Other | Admitting: Allergy and Immunology

## 2021-10-08 ENCOUNTER — Encounter: Payer: Self-pay | Admitting: Allergy and Immunology

## 2021-10-08 ENCOUNTER — Ambulatory Visit (INDEPENDENT_AMBULATORY_CARE_PROVIDER_SITE_OTHER): Payer: Medicare Other | Admitting: Obstetrics and Gynecology

## 2021-10-08 VITALS — BP 106/70 | HR 60 | Temp 98.0°F | Resp 16 | Ht 64.0 in | Wt 141.4 lb

## 2021-10-08 VITALS — BP 120/72 | HR 66 | Ht 64.0 in | Wt 139.0 lb

## 2021-10-08 DIAGNOSIS — G43909 Migraine, unspecified, not intractable, without status migrainosus: Secondary | ICD-10-CM

## 2021-10-08 DIAGNOSIS — J3089 Other allergic rhinitis: Secondary | ICD-10-CM | POA: Diagnosis not present

## 2021-10-08 DIAGNOSIS — K219 Gastro-esophageal reflux disease without esophagitis: Secondary | ICD-10-CM

## 2021-10-08 DIAGNOSIS — J301 Allergic rhinitis due to pollen: Secondary | ICD-10-CM

## 2021-10-08 DIAGNOSIS — N952 Postmenopausal atrophic vaginitis: Secondary | ICD-10-CM

## 2021-10-08 DIAGNOSIS — R102 Pelvic and perineal pain: Secondary | ICD-10-CM

## 2021-10-08 DIAGNOSIS — R109 Unspecified abdominal pain: Secondary | ICD-10-CM

## 2021-10-08 DIAGNOSIS — R140 Abdominal distension (gaseous): Secondary | ICD-10-CM

## 2021-10-08 MED ORDER — TRIAMCINOLONE ACETONIDE 55 MCG/ACT NA AERO: 2.0000 | INHALATION_SPRAY | Freq: Two times a day (BID) | NASAL | 11 refills | Status: DC

## 2021-10-08 MED ORDER — PANTOPRAZOLE SODIUM 40 MG PO TBEC: 40.0000 mg | DELAYED_RELEASE_TABLET | Freq: Two times a day (BID) | ORAL | 11 refills | Status: DC

## 2021-10-08 MED ORDER — LORATADINE 10 MG PO TABS: 10.0000 mg | ORAL_TABLET | Freq: Two times a day (BID) | ORAL | 11 refills | Status: AC | PRN

## 2021-10-08 NOTE — Progress Notes (Signed)
GYNECOLOGY  VISIT ?  ?HPI: ?71 y.o.   Married Black or [place] American Not Hispanic or Latino  female   ?[id number] with No LMP recorded. Patient is postmenopausal.   ?here for ultrasound consult for abdominal pain and bloating. ? ?She is seeing her GI for the same c/o. She does have a h/o IBS-C and GERD. Recent endoscopy with gastritis. She has had some improvement in her symptoms with medication changes. Still has been worried. ? ?She c/o some spotting when cleaning up after the ultrasound. Mild discomfort with the exam.  ? ?GYNECOLOGIC HISTORY: ?No LMP recorded. Patient is postmenopausal. ?Contraception:PMP  ?Menopausal hormone therapy: none  ?       ?OB History   ? ? Gravida  ?0  ? Para  ?0  ? Term  ?0  ? Preterm  ?0  ? AB  ?0  ? Living  ?0  ?  ? ? SAB  ?0  ? IAB  ?0  ? Ectopic  ?0  ? Multiple  ?0  ? Live Births  ?0  ?   ?  ?  ?    ? ?Patient Active Problem List  ? Diagnosis Date Noted  ? Neutropenia ([place]) 10/03/2021  ? 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 Environmental allergies 04/24/2020  ? Irritable bowel syndrome with constipation 09/02/2019  ? Fear of side effects of medication 08/28/2019  ? Upper airway cough syndrome 05/04/2019  ? Pain in right knee 12/27/2018  ? Bigeminy 03/25/2018  ? Osteoporosis 08/11/2017  ? Abnormal glucose 06/25/2017  ? Medication management 12/12/2014  ? 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 GERD (gastroesophageal reflux disease)   ? Vitamin D deficiency   ? Hyperlipidemia, mixed   ? ? ?Past Medical History:  ?Diagnosis Date  ? 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 Cataract   ? Mixed OU  ? Costochondritis 08/27/2020  ? Depression   ? GERD (gastroesophageal reflux disease)   ? Hyperlipidemia   ? IBS (irritable bowel syndrome)   ? Osteoporosis   ? Prediabetes   ? Vitamin D deficiency   ? ? ?Past Surgical History:  ?Procedure Laterality Date  ? CARDIAC CATHETERIZATION  2011  ? normal (Dr. Terrence Dupont)  ? RADIOACTIVE SEED GUIDED EXCISIONAL BREAST BIOPSY Right 03/11/2016  ? Procedure: RIGHT  RADIOACTIVE SEED GUIDED EXCISIONAL BREAST BIOPSY;  Surgeon: Rolm Bookbinder, MD;  Location: [facility];  Service: General;  Laterality: Right;  ? ? ?Current Outpatient Medications  ?Medication Sig Dispense Refill  ? aspirin 81 MG tablet Take 81 mg by mouth daily.    ? Bacillus Coagulans-Inulin (ALIGN PREBIOTIC-PROBIOTIC PO) Take 1 tablet by mouth daily.    ? Calcium Carbonate-Vit D-Min (CALCIUM 1200 PO) Take by mouth.    ? Cholecalciferol 125 MCG (5000 UT) capsule Take 5,000 Units by mouth daily.    ? ezetimibe (ZETIA) 10 MG tablet Take   1 tablet   Daily for Cholesterol 90 tablet 0  ? Fluticasone Propionate (FLONASE ALLERGY RELIEF NA) Place 2 sprays into the nose as needed.    ? Hyoscyamine Sulfate (HYOSCYAMINE PO) Take by mouth as needed.    ? loratadine (CLARITIN) 10 MG tablet Take 1 tablet (10 mg total) by mouth 2 (two) times daily as needed for allergies (Can take an extra dose during flare ups.). Take 1 tablet 1-2 times daily 60 tablet 11  ? Lutein 20 MG TABS Take by mouth.    ? Magnesium 250 MG TABS     ? Multiple  Vitamins-Minerals (AIRBORNE) CHEW Chew 1 tablet by mouth as needed.    ? pantoprazole (PROTONIX) 40 MG tablet Take 1 tablet (40 mg total) by mouth 2 (two) times daily. 60 tablet 11  ? Polyethylene Glycol 3350 (MIRALAX PO) Take by mouth at bedtime.    ? RESTASIS 0.05 % ophthalmic emulsion instill 1 drop into both eyes twice a day  0  ? triamcinolone (NASACORT ALLERGY 24HR) 55 MCG/ACT AERO nasal inhaler Place 2 sprays into the nose 2 (two) times daily. 1-2 sprays per nostril 1-7 times per week. 16.9 g 11  ? ?No current facility-administered medications for this visit.  ?  ? ?ALLERGIES: Bactrim [sulfamethoxazole-trimethoprim], Cephalexin, and Sulfa antibiotics ? ?Family History  ?Problem Relation Age of Onset  ? Hypertension Mother   ? Other Mother   ?     surgeries involving upper back  ? Diabetes Father   ? Heart failure Father   ? Cancer Maternal Grandmother   ?     Lung  ? Allergic  rhinitis Neg Hx   ? Angioedema Neg Hx   ? Asthma Neg Hx   ? Atopy Neg Hx   ? Eczema Neg Hx   ? Immunodeficiency Neg Hx   ? Urticaria Neg Hx   ? Migraines Neg Hx   ? ? ?Social History  ? ?Socioeconomic History  ? Marital status: Married  ?  Spouse name: Not on file  ? Number of children: 0  ? Years of education: Not on file  ? Highest education level: Not on file  ?Occupational History  ? Occupation: retired Copywriter, advertising  ?Tobacco Use  ? Smoking status: Never  ?  Passive exposure: Never  ? Smokeless tobacco: Never  ?Vaping Use  ? Vaping Use: Never used  ?Substance and Sexual Activity  ? Alcohol use: Not Currently  ? Drug use: No  ? Sexual activity: Yes  ?  Birth control/protection: Post-menopausal  ?  Comment: 1st intercourse 44 yo-5 partners  ?Other Topics Concern  ? Not on file  ?Social History Narrative  ? Lives at home with spouse  ? Right handed  ? Caffeine: not much, drinks decaf 2 cups/day  ? ?Social Determinants of Health  ? ?Financial Resource Strain: Not on file  ?Food Insecurity: Not on file  ?Transportation Needs: Not on file  ?Physical Activity: Not on file  ?Stress: Not on file  ?Social Connections: Not on file  ?Intimate Partner Violence: Not on file  ? ? ?Review of Systems  ?All other systems reviewed and are negative. ? ?PHYSICAL EXAMINATION:   ? ?BP 120/72   Pulse 66   Ht '5\' 4"'$  (1.626 m)   Wt 139 lb (63 kg)   SpO2 100%   BMI 23.86 kg/m?     ?General appearance: alert, cooperative and appears stated age ? ?Pelvic: External genitalia:  no lesions ?             Urethra:  normal appearing urethra with no masses, tenderness or lesions ?             Bartholins and Skenes: normal    ?             Vagina: atrophic appearing vagina, slightly friable at the apex.  ?             Cervix: no lesions ?              ? ?Chaperone was present for exam. ? ?Pelvic ultrasound ? ?Indications: abdominal pain,  bloating ? ?Findings: ? ?Uterus 6.04 x 3.92 x 2.37 cm, anteverted  ?Fibroids: ?1) 1.56 x 1.18  cm ?2) 1.14 x 0.59 cm ?3) 1.06 x 0.75 cm ?4) 0.93 x 0.69 cm ? ?Endometrium 1.3 mm, thin, symmetrical ? ?Left ovary 2.06 x 0.74 x 0.94 cm, atrophic ? ?Right ovary 1.63 x 0.85 x 0.69 cm, atrophic ? ?No free fluid ? ?Impression:  ?Small, anteverted uterus ?Several small, stable fibroids ?Normal adnexa bilaterally ?No free fluid ? ?1. Combined abdominal and pelvic pain ?- US Transvaginal Non-OB: normal.  ?-f/U with GI ? ?2. Bloating ?- US Transvaginal Non-OB: normal ?-F/U with GI ? ?3. Vaginal atrophy ?Slight spotting after ultrasound is secondary to vaginal irritation from the u/s probe. Patient reassured.  ? ? ?

## 2021-10-08 NOTE — Progress Notes (Signed)
? ?[facility] ? ? ?Follow-up Note ? ?Referring Provider: Unk Pinto, MD ?Primary Provider: Unk Pinto, MD ?Date of Office Visit: 10/08/2021 ? ?Subjective:  ? ?[name] (DOB: 06-18-1950) is a 71 y.o. female who returns to the [employer] [facility] on 10/08/2021 in re-evaluation of the following: ? ?HPI: Sophi returns to this clinic in evaluation of allergic rhinitis, LPR, migraine syndrome.  I last saw her in this clinic on 02 April 2021. ? ?She is doing very well at this point in time with her airway issue while intermittently using Nasacort and she has not required a systemic steroid or antibiotic for any type of airway issue and overall is pleased with the response she is currently receiving on her current plan. ? ?As well, her headaches are much better at this point.  She has adjusted her lifestyle and she does not fall asleep with the TV on and overall her sleep is better and her headaches are better. ? ?She was having an issue with dry cough and some throat clearing which was believed to be secondary to LPR and she visited with a gastroenterologist and she is now using her pantoprazole twice a day and for the most part that issue has completely resolved. ? ?Allergies as of 10/08/2021   ? ?   Reactions  ? Bactrim [sulfamethoxazole-trimethoprim] Other (See Comments)  ? Patient preference to take medication without sulfa  ? Cephalexin Nausea And Vomiting  ? Rash; chest pain ?Rash; chest pain  ? Sulfa Antibiotics   ? ?  ? ?  ?Medication List  ? ? ?Airborne Chew ?Chew 1 tablet by mouth as needed. ?  ?ALIGN PREBIOTIC-PROBIOTIC PO ?Take 1 tablet by mouth daily. ?  ?aspirin 81 MG tablet ?Take 81 mg by mouth daily. ?  ?CALCIUM 1200 PO ?Take by mouth. ?  ?Cholecalciferol 125 MCG (5000 UT) capsule ?Take 5,000 Units by mouth daily. ?  ?ezetimibe 10 MG tablet ?Commonly known as: ZETIA ?Take   1 tablet   Daily for Cholesterol ?  ?FLONASE ALLERGY RELIEF  NA ?Place 2 sprays into the nose as needed. ?  ?HYOSCYAMINE PO ?Take by mouth as needed. ?  ?loratadine 10 MG tablet ?Commonly known as: CLARITIN ?Take 1 tablet 1-2 times daily ?  ?Lutein 20 MG Tabs ?Take by mouth. ?  ?Magnesium 250 MG Tabs ?  ?MIRALAX PO ?Take by mouth at bedtime. ?  ?pantoprazole 40 MG tablet ?Commonly known as: PROTONIX ?Take 1 tablet (40 mg total) by mouth in the morning. ?  ?Restasis 0.05 % ophthalmic emulsion ?Generic drug: cycloSPORINE ?instill 1 drop into both eyes twice a day ?  ? ?Past Medical History:  ?Diagnosis Date  ? 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 Cataract   ? Mixed OU  ? Costochondritis 08/27/2020  ? Depression   ? GERD (gastroesophageal reflux disease)   ? Hyperlipidemia   ? IBS (irritable bowel syndrome)   ? Osteoporosis   ? Prediabetes   ? Vitamin D deficiency   ? ? ?Past Surgical History:  ?Procedure Laterality Date  ? CARDIAC CATHETERIZATION  2011  ? normal (Dr. Terrence Dupont)  ? RADIOACTIVE SEED GUIDED EXCISIONAL BREAST BIOPSY Right 03/11/2016  ? Procedure: RIGHT RADIOACTIVE SEED GUIDED EXCISIONAL BREAST BIOPSY;  Surgeon: Rolm Bookbinder, MD;  Location: [facility];  Service: General;  Laterality: Right;  ? ? ?Review of systems negative except as noted in HPI / PMHx or noted below: ? ?  Review of Systems  ?Constitutional: Negative.   ?HENT: Negative.    ?Eyes: Negative.   ?Respiratory: Negative.    ?Cardiovascular: Negative.   ?Gastrointestinal: Negative.   ?Genitourinary: Negative.   ?Musculoskeletal: Negative.   ?Skin: Negative.   ?Neurological: Negative.   ?Endo/Heme/Allergies: Negative.   ?Psychiatric/Behavioral: Negative.    ? ? ?Objective:  ? ?Vitals:  ? 10/08/21 1142  ?BP: 106/70  ?Pulse: 60  ?Resp: 16  ?Temp: 98 ?F (36.7 ?C)  ?SpO2: 100%  ? ?Height: '5\' 4"'$  (162.6 cm)  ?Weight: 141 lb 6.4 oz (64.1 kg)  ? ?Physical Exam ?Constitutional:   ?   Appearance: She is not diaphoretic.  ?HENT:  ?   Head: Normocephalic.  ?   Right Ear: Tympanic membrane, ear canal and  external ear normal.  ?   Left Ear: Tympanic membrane, ear canal and external ear normal.  ?   Nose: Nose normal. No mucosal edema or rhinorrhea.  ?   Mouth/Throat:  ?   Pharynx: Uvula midline. No oropharyngeal exudate.  ?Eyes:  ?   Conjunctiva/sclera: Conjunctivae normal.  ?Neck:  ?   Thyroid: No thyromegaly.  ?   Trachea: Trachea normal. No tracheal tenderness or tracheal deviation.  ?Cardiovascular:  ?   Rate and Rhythm: Normal rate and regular rhythm.  ?   Heart sounds: Normal heart sounds, S1 normal and S2 normal. No murmur heard. ?Pulmonary:  ?   Effort: No respiratory distress.  ?   Breath sounds: Normal breath sounds. No stridor. No wheezing or rales.  ?Lymphadenopathy:  ?   Head:  ?   Right side of head: No tonsillar adenopathy.  ?   Left side of head: No tonsillar adenopathy.  ?   Cervical: No cervical adenopathy.  ?Skin: ?   Findings: No erythema or rash.  ?   Nails: There is no clubbing.  ?Neurological:  ?   Mental Status: She is alert.  ? ? ?Diagnostics: none ? ?Assessment and Plan:  ? ?1. Perennial allergic rhinitis   ?2. Seasonal allergic rhinitis due to pollen   ?3. LPRD (laryngopharyngeal reflux disease)   ?4. Migraine syndrome   ? ? ?1.  Allergen avoidance measures - dust mite, pollens, molds, cat ? ?2.  Continue to treat and prevent inflammation: ? ? A. Nasacort - 1-2 sprays each nostril 1-7 times per week ? ?3.  Continue to Treat and prevent reflux: ? ? A.  Pantoprazole 40 mg 1-2 times per day ? ?4. If needed: ? ? A. Loratadine 10 mg - 1 tablet 1-2 times per day ? ?5.  Return to clinic in 12 months or earlier if problem. ? ?Eura is doing quite well at this point in time with her current medical plan which is a combination of allergen avoidance measures, use of anti-inflammatory agents for airway utilized on a intermittent basis, and aggressive therapy directed against reflux/LPR.  We will now see her back in this clinic in 1 year or earlier if there is a problem. ? ?Allena Katz, MD ?Allergy /  Immunology ?[facility] ?

## 2021-10-08 NOTE — Patient Instructions (Addendum)
?  1.  Allergen avoidance measures - dust mite, pollens, molds, cat ? ?2.  Continue to treat and prevent inflammation: ? ? A. Nasacort - 1-2 sprays each nostril 1-7 times per week ? ?3.  Continue to Treat and prevent reflux: ? ? A.  Pantoprazole 40 mg 1-2 times per day ? ?4. If needed: ? ? A. Loratadine 10 mg - 1 tablet 1-2 times per day ? ?5.  Return to clinic in 12 months or earlier if problem. ?

## 2021-10-09 ENCOUNTER — Encounter: Payer: Self-pay | Admitting: Allergy and Immunology

## 2021-10-14 ENCOUNTER — Ambulatory Visit: Payer: Medicare Other

## 2021-10-14 DIAGNOSIS — D709 Neutropenia, unspecified: Secondary | ICD-10-CM

## 2021-10-14 NOTE — Progress Notes (Signed)
The patient came in for repeat lab work. The patient has no new complaints today.  ?

## 2021-10-15 ENCOUNTER — Encounter: Payer: Self-pay | Admitting: Adult Health

## 2021-10-15 DIAGNOSIS — E538 Deficiency of other specified B group vitamins: Secondary | ICD-10-CM | POA: Insufficient documentation

## 2021-10-16 LAB — HEPATITIS C ANTIBODY
Hepatitis C Ab: NONREACTIVE
SIGNAL TO CUT-OFF: 0.03 (ref <1.00)

## 2021-10-16 LAB — VITAMIN B12: Vitamin B-12: 403 pg/mL (ref 200–1100)

## 2021-10-16 LAB — C-REACTIVE PROTEIN: CRP: 0.8 mg/L (ref <8.0)

## 2021-10-16 LAB — SEDIMENTATION RATE: Sed Rate: 9 mm/h (ref 0–30)

## 2021-10-16 LAB — COPPER, SERUM: Copper: 113 ug/dL (ref 70–175)

## 2021-10-16 LAB — FOLATE RBC: RBC Folate: 634 ng/mL RBC (ref >280)

## 2021-10-16 LAB — HIV ANTIBODY (ROUTINE TESTING W REFLEX): HIV 1&2 Ab, 4th Generation: NONREACTIVE

## 2021-10-16 LAB — HEPATITIS B SURFACE ANTIBODY, QUANTITATIVE: Hep B S AB Quant (Post): <5 m[IU]/mL — ABNORMAL LOW (ref >=10)

## 2021-10-22 ENCOUNTER — Encounter (HOSPITAL_BASED_OUTPATIENT_CLINIC_OR_DEPARTMENT_OTHER): Payer: Self-pay

## 2021-10-22 ENCOUNTER — Other Ambulatory Visit: Payer: Self-pay

## 2021-10-22 ENCOUNTER — Emergency Department (HOSPITAL_BASED_OUTPATIENT_CLINIC_OR_DEPARTMENT_OTHER): Payer: Medicare Other

## 2021-10-22 ENCOUNTER — Emergency Department (HOSPITAL_BASED_OUTPATIENT_CLINIC_OR_DEPARTMENT_OTHER)
Admission: EM | Admit: 2021-10-22 | Discharge: 2021-10-22 | Disposition: A | Discharge status: Home / Self Care | Payer: Medicare Other | Attending: Emergency Medicine | Admitting: Emergency Medicine

## 2021-10-22 DIAGNOSIS — W2209XA Striking against other stationary object, initial encounter: Secondary | ICD-10-CM | POA: Diagnosis not present

## 2021-10-22 DIAGNOSIS — S0990XA Unspecified injury of head, initial encounter: Secondary | ICD-10-CM | POA: Diagnosis present

## 2021-10-22 DIAGNOSIS — Z7982 Long term (current) use of aspirin: Secondary | ICD-10-CM | POA: Insufficient documentation

## 2021-10-22 DIAGNOSIS — H6123 Impacted cerumen, bilateral: Secondary | ICD-10-CM | POA: Insufficient documentation

## 2021-10-22 NOTE — ED Notes (Signed)
Discharge paperwork given and understood. 

## 2021-10-22 NOTE — ED Provider Notes (Signed)
?[facility] ?Provider Note ? ? ?CSN: [id number] ?Arrival date & time: 10/22/21  1652 ? ?  ? ?History ? ?Chief Complaint  ?Patient presents with  ? Head Injury  ? ? ?[name] is a 71 y.o. female. ? ?Patient is a 71 year old female with a history of prediabetes, hyperlipidemia, IBS, GERD who is presenting today with ongoing intermittent headaches after hitting her head on Friday night.  Patient reports she fell asleep while sitting at the table and face planted onto the corner of the wooden table.  She initially had bruising and swelling of her forehead which seem to improve but she has noticed headaches intermittently in the front of her head and midway to the back of her head over the last few days.  She has not noticed any difficulty walking, speech problems, visual changes.  She denies any neck pain.  She does not take anticoagulation. ? ?The history is provided by the patient.  ?Head Injury ? ?  ? ?Home Medications ?Prior to Admission medications   ?Medication Sig Start Date End Date Taking? Authorizing Provider  ?aspirin 81 MG tablet Take 81 mg by mouth daily.    [provider]  ?Bacillus Coagulans-Inulin (ALIGN PREBIOTIC-PROBIOTIC PO) Take 1 tablet by mouth daily.    [provider]  ?Calcium Carbonate-Vit D-Min (CALCIUM 1200 PO) Take by mouth.    [provider]  ?Cholecalciferol 125 MCG (5000 UT) capsule Take 5,000 Units by mouth daily.    [provider]  ?ezetimibe (ZETIA) 10 MG tablet Take   1 tablet   Daily for Cholesterol 05/07/21   Unk Pinto, MD  ?Fluticasone Propionate [facility] ALLERGY RELIEF NA) Place 2 sprays into the nose as needed.    [provider]  ?Hyoscyamine Sulfate (HYOSCYAMINE PO) Take by mouth as needed.    [provider]  ?loratadine (CLARITIN) 10 MG tablet Take 1 tablet (10 mg total) by mouth 2 (two) times daily as needed for allergies (Can take an extra dose during flare ups.). Take 1 tablet  1-2 times daily 10/08/21   Kozlow, Donnamarie Poag, MD  ?Lutein 20 MG TABS Take by mouth.    [provider]  ?Magnesium 250 MG TABS     [provider]  ?Multiple Vitamins-Minerals (AIRBORNE) CHEW Chew 1 tablet by mouth as needed.    [provider]  ?pantoprazole (PROTONIX) 40 MG tablet Take 1 tablet (40 mg total) by mouth 2 (two) times daily. 10/08/21   Kozlow, Donnamarie Poag, MD  ?Polyethylene Glycol 3350 (MIRALAX PO) Take by mouth at bedtime.    [provider]  ?RESTASIS 0.05 % ophthalmic emulsion instill 1 drop into both eyes twice a day 11/08/14   [provider]  ?triamcinolone (NASACORT ALLERGY 24HR) 55 MCG/ACT AERO nasal inhaler Place 2 sprays into the nose 2 (two) times daily. 1-2 sprays per nostril 1-7 times per week. 10/08/21   Kozlow, Donnamarie Poag, MD  ?   ? ?Allergies    ?Bactrim [sulfamethoxazole-trimethoprim], Cephalexin, and Sulfa antibiotics   ? ?Review of Systems   ?Review of Systems ? ?Physical Exam ?Updated Vital Signs ?BP 108/82   Pulse 60   Temp 98.4 ?F (36.9 ?C)   Resp 16   Ht '5\' 4"'$  (1.626 m)   Wt 63.6 kg   SpO2 100%   BMI 24.07 kg/m?  ?Physical Exam ?Vitals and nursing note reviewed.  ?Constitutional:   ?   General: She is not in acute distress. ?   Appearance:  Normal appearance. She is normal weight.  ?HENT:  ?   Head: Normocephalic and atraumatic.  ?   Right Ear: There is impacted cerumen.  ?   Left Ear: There is impacted cerumen.  ?   Nose: Nose normal.  ?   Mouth/Throat:  ?   Mouth: Mucous membranes are moist.  ?Eyes:  ?   Extraocular Movements: Extraocular movements intact.  ?   Pupils: Pupils are equal, round, and reactive to light.  ?Cardiovascular:  ?   Rate and Rhythm: Normal rate.  ?Pulmonary:  ?   Effort: Pulmonary effort is normal.  ?Musculoskeletal:  ?   Cervical back: Normal range of motion and neck supple. No tenderness.  ?Skin: ?   General: Skin is warm and dry.  ?Neurological:  ?   Mental Status: She is alert. Mental status is at baseline.  ?    Motor: No weakness.  ?   Coordination: Coordination normal.  ?   Gait: Gait normal.  ?Psychiatric:     ?   Mood and Affect: Mood normal.  ? ? ?ED Results / Procedures / Treatments   ?Labs ?(all labs ordered are listed, but only abnormal results are displayed) ?Labs Reviewed - No data to display ? ?EKG ?None ? ?Radiology ?CT Head Wo Contrast ? ?Result Date: 10/22/2021 ?CLINICAL DATA:  Minor head trauma. EXAM: CT HEAD WITHOUT CONTRAST TECHNIQUE: Contiguous axial images were obtained from the base of the skull through the vertex without intravenous contrast. RADIATION DOSE REDUCTION: This exam was performed according to the departmental dose-optimization program which includes automated exposure control, adjustment of the mA and/or kV according to patient size and/or use of iterative reconstruction technique. COMPARISON:  CT brain 06/28/2020 FINDINGS: Brain: The ventricles are normal in size and configuration. The basilar cisterns are patent. No mass, mass effect, or midline shift. No acute intracranial hemorrhage is seen. No abnormal extra-axial fluid collection. Preservation of the normal cortical gray-white interface without CT evidence of an acute major vascular territorial cortical based infarction. Vascular: Mild atherosclerotic calcifications within the carotid siphons. No hyperdense vessel or unexpected calcification. Skull: Normal. Negative for fracture or focal lesion. Sinuses/Orbits: The visualized orbits are unremarkable. The visualized paranasal sinuses and mastoid air cells are clear. Other: None. IMPRESSION: No acute intracranial process.  No significant change from prior. Electronically Signed   By: Yvonne Kendall M.D.   On: 10/22/2021 18:36   ? ?Procedures ?Procedures  ? ? ?Medications Ordered in ED ?Medications - No data to display ? ?ED Course/ Medical Decision Making/ A&P ?  ?                        ?Medical Decision Making ?Amount and/or Complexity of Data Reviewed ?Radiology: ordered and independent  interpretation performed. Decision-making details documented in ED Course. ? ? ?Patient presenting today with complaints of ongoing intermittent headaches after head trauma 3 days ago.  Patient's symptoms are most likely related to a concussion but based on patient's age and trauma we will do a CT to ensure no other acute process.  Normal neurologic exam here. ? ?I have independently visualized and interpreted pt's images today.  CT of head in negative for intracranial bleed.  Radiology reports it was negative.  Findings discussed with the patient.  She is in good condition at this time and does not appear to require admission.  No social determinants affecting her discharge today. ? ? ? ? ? ? ? ? ? ?Final Clinical  Impression(s) / ED Diagnoses ?Final diagnoses:  ?Injury of head, initial encounter  ? ? ?Rx / DC Orders ?ED Discharge Orders   ? ? None  ? ?  ? ? ?  ?Blanchie Dessert, MD ?10/22/21 1849 ? ?

## 2021-10-22 NOTE — ED Triage Notes (Signed)
Patient here POV from Home. ? ?Endorses Head Injury. Sustained Injury on Friday PM. Patient was eating at [employer] when she became Sleepy and hit her Head on the [place] of the Table. ? ?Still endorses Pain to Anterior and Posterior Head. No LOC. No Anticoagulants.  ? ?NAD Noted during Triage. A&Ox4. GCS 15. Ambulatory. ?

## 2021-10-22 NOTE — Discharge Instructions (Signed)
The CAT scan was normal.  You most likely have a mild concussion.  It is okay to take Tylenol for this but it should get better on its own. ?

## 2021-11-11 ENCOUNTER — Other Ambulatory Visit: Payer: Self-pay | Admitting: Internal Medicine

## 2021-11-11 DIAGNOSIS — E782 Mixed hyperlipidemia: Secondary | ICD-10-CM

## 2021-11-11 MED ORDER — EZETIMIBE 10 MG PO TABS: ORAL_TABLET | ORAL | 3 refills | Status: DC

## 2021-11-15 ENCOUNTER — Encounter: Payer: Self-pay | Admitting: Adult Health

## 2021-11-15 ENCOUNTER — Ambulatory Visit: Payer: Medicare Other | Admitting: Adult Health

## 2021-11-15 VITALS — BP 106/60 | HR 59 | Temp 96.9°F | Wt 142.2 lb

## 2021-11-15 DIAGNOSIS — M778 Other enthesopathies, not elsewhere classified: Secondary | ICD-10-CM | POA: Diagnosis not present

## 2021-11-15 DIAGNOSIS — D709 Neutropenia, unspecified: Secondary | ICD-10-CM | POA: Diagnosis not present

## 2021-11-15 DIAGNOSIS — E538 Deficiency of other specified B group vitamins: Secondary | ICD-10-CM

## 2021-11-15 DIAGNOSIS — E782 Mixed hyperlipidemia: Secondary | ICD-10-CM

## 2021-11-15 LAB — CBC WITH DIFFERENTIAL/PLATELET
Absolute Monocytes: 273 cells/uL (ref 200–950)
Basophils Absolute: 19 cells/uL (ref 0–200)
Basophils Relative: 0.7 %
Eosinophils Absolute: 70 cells/uL (ref 15–500)
Eosinophils Relative: 2.6 %
HCT: 35.4 % (ref 35.0–45.0)
Hemoglobin: 12 g/dL (ref 11.7–15.5)
Lymphs Abs: 1304 cells/uL (ref 850–3900)
MCH: 29.6 pg (ref 27.0–33.0)
MCHC: 33.9 g/dL (ref 32.0–36.0)
MCV: 87.2 fL (ref 80.0–100.0)
MPV: 11.3 fL (ref 7.5–12.5)
Monocytes Relative: 10.1 %
Neutro Abs: 1034 cells/uL — ABNORMAL LOW (ref 1500–7800)
Neutrophils Relative %: 38.3 %
Platelets: 156 10*3/uL (ref 140–400)
RBC: 4.06 10*6/uL (ref 3.80–5.10)
RDW: 13 % (ref 11.0–15.0)
Total Lymphocyte: 48.3 %
WBC: 2.7 10*3/uL — ABNORMAL LOW (ref 3.8–10.8)

## 2021-11-15 MED ORDER — EZETIMIBE 10 MG PO TABS: ORAL_TABLET | ORAL | 3 refills | Status: DC

## 2021-11-15 MED ORDER — DEXAMETHASONE 2 MG PO TABS: ORAL_TABLET | ORAL | 0 refills | Status: DC

## 2021-11-15 NOTE — Progress Notes (Signed)
Assessment and Plan:  Nataleigh was seen today for medication management.  Diagnoses and all orders for this visit:  Neutropenia, unspecified type ([place]) Long standing but downward trend, benign work up thus far other than possible borderline B12, recheck today - if trending down further she would like hematology evaluation  -     CBC with Differential/Platelet  Hyperlipidemia, mixed -     ezetimibe (ZETIA) 10 MG tablet; Take 1 tablet Dail for Cholesterol  B12 deficiency Continue supplement; plan recheck next OV  Right shoulder tendonitis Tendonitis/mild strain Rest/ice, avoid straining activities; try steroid taper then can start gentle exercises provided in 2 weeks if doing better - if persistently not improving plan imaging vs ortho referral -     dexamethasone (DECADRON) 2 MG tablet; Take 1 tab 3 x day for 3 days, then 2 x day for 3 days, then 1 tab daily   Further disposition pending results of labs. Discussed med's effects and SE's.   Over 30 minutes of exam, counseling, chart review, and critical decision making was performed.   Future Appointments  Date Time Provider [place]  04/03/2022 10:00 AM Unk Pinto, MD [facility] None  07/23/2022  9:40 AM Philemon Kingdom, MD [facility] None  10/03/2022 10:00 AM Liane Comber, NP [facility] None    ------------------------------------------------------------------------------------------------------------------   HPI BP 106/60   Pulse (!) 59   Temp (!) 96.9 F (36.1 C)   Wt 142 lb 3.2 oz (64.5 kg)   SpO2 99%   BMI 24.41 kg/m  71 y.o.female presents for CBC recheck due to leukopenia/neutropenia with downward trend. Also requesting zetia refill, pharmacy stated couldn't refill. She is also c/o R shoulder pain x 2-3 months  She is R handed, has noted intermittent posterior shoulder/deltoid area pain x 2-3 months, started after strained shoulder reaching into the back seat. Bothers her when reaching behind her or  extended lifting with shoulder, will radiate to elbow. No limitations in ROM. Denies neck pain or alterations in sensation, radicular sx to hand. Hasn't tried anything thus far -   She has had mild leukopenia with neutropenia for many years, typically near 2.8-3.0, atypically was 2.0 with neutrophils 654. She did have path smear in 05/2019 showing absolute neutropemia, reactive changes. Had normal folate, copper, sed, CRP, HIV, hep B/C, did have borderline low B12 check 10/14/2021 and was recommended to start on SL supplement, reports taking 1000 mcg 3 days a week. She denies night sweats, fatigue, aching.      Latest Ref Rng & Units 10/02/2021   11:12 AM 04/03/2021   10:18 AM 02/17/2021    5:04 AM  CBC  WBC 3.8 - 10.8 Thousand/uL 2.0   2.9   3.5    Hemoglobin 11.7 - 15.5 g/dL 12.3   12.9   12.4    Hematocrit 35.0 - 45.0 % 36.6   38.5   37.1    Platelets 140 - 400 Thousand/uL 178   158   156     Lab Results  Component Value Date   VITAMINB12 403 10/14/2021     Past Medical History:  Diagnosis Date   Arthritis    cervical and lumbar   Cataract    Mixed OU   Costochondritis 08/27/2020   Depression    GERD (gastroesophageal reflux disease)    Hyperlipidemia    IBS (irritable bowel syndrome)    Osteoporosis    Prediabetes    Vitamin D deficiency      Allergies  Allergen Reactions  Bactrim [Sulfamethoxazole-Trimethoprim] Other (See Comments)    Patient preference to take medication without sulfa   Cephalexin Nausea And Vomiting    Rash; chest pain Rash; chest pain   Sulfa Antibiotics     Current Outpatient Medications on File Prior to Visit  Medication Sig   aspirin 81 MG tablet Take 81 mg by mouth daily.   Bacillus Coagulans-Inulin (ALIGN PREBIOTIC-PROBIOTIC PO) Take 1 tablet by mouth daily.   Calcium Carbonate-Vit D-Min (CALCIUM 1200 PO) Take by mouth.   Cholecalciferol 125 MCG (5000 UT) capsule Take 5,000 Units by mouth daily.   Fluticasone Propionate (FLONASE ALLERGY  RELIEF NA) Place 2 sprays into the nose as needed.   Hyoscyamine Sulfate (HYOSCYAMINE PO) Take by mouth as needed.   loratadine (CLARITIN) 10 MG tablet Take 1 tablet (10 mg total) by mouth 2 (two) times daily as needed for allergies (Can take an extra dose during flare ups.). Take 1 tablet 1-2 times daily   Lutein 20 MG TABS Take by mouth.   Magnesium 250 MG TABS    Multiple Vitamins-Minerals (AIRBORNE) CHEW Chew 1 tablet by mouth as needed.   pantoprazole (PROTONIX) 40 MG tablet Take 1 tablet (40 mg total) by mouth 2 (two) times daily.   Polyethylene Glycol 3350 (MIRALAX PO) Take by mouth at bedtime.   RESTASIS 0.05 % ophthalmic emulsion instill 1 drop into both eyes twice a day   triamcinolone (NASACORT ALLERGY 24HR) 55 MCG/ACT AERO nasal inhaler Place 2 sprays into the nose 2 (two) times daily. 1-2 sprays per nostril 1-7 times per week.   vitamin B-12 (CYANOCOBALAMIN) 1000 MCG tablet Take 1,000 mcg by mouth 3 (three) times a week.   No current facility-administered medications on file prior to visit.    ROS: all negative except above.   Physical Exam:  BP 106/60   Pulse (!) 59   Temp (!) 96.9 F (36.1 C)   Wt 142 lb 3.2 oz (64.5 kg)   SpO2 99%   BMI 24.41 kg/m   General Appearance: Well nourished, in no apparent distress. Eyes: PERRLA, EOMs, conjunctiva no swelling or erythema Sinuses: No Frontal/maxillary tenderness ENT/Mouth: Ext aud canals clear, TMs without erythema, bulging. No erythema, swelling, or exudate on post pharynx.  Tonsils not swollen or erythematous. Hearing normal.  Neck: Supple, thyroid normal.  Respiratory: Respiratory effort normal, BS equal bilaterally without rales, rhonchi, wheezing or stridor.  Cardio: RRR with no MRGs. Brisk peripheral pulses without edema.  Abdomen: Soft, + BS.  Non tender, no guarding, rebound, hernias, masses. Lymphatics: Non tender without lymphadenopathy.  Musculoskeletal: Full ROM, 5/5 strength, normal gait. R shoulder with  posterior deltoid tenderness, some pain with internal rotation against resistance.  Skin: Warm, dry without rashes, lesions, ecchymosis.  Neuro: Cranial nerves intact. Normal muscle tone, no cerebellar symptoms. Sensation intact.  Psych: Awake and oriented X 3, normal affect, Insight and Judgment appropriate.     Izora Ribas, NP 11:13 AM [facility]

## 2021-11-15 NOTE — Patient Instructions (Signed)
Shoulder Impingement Syndrome  Shoulder impingement syndrome is a condition that causes pain when connective tissues (tendons) surrounding the shoulder joint become pinched. These tendons are part of the group including muscles and tissues that help to stabilize the shoulder (rotator cuff). Beneath the rotator cuff is a fluid-filled sac (bursa) that allows the muscles and tendons to glide smoothly. The bursa may become swollen or irritated (bursitis). Bursitis, swelling in the rotator cuff tendons, or both conditions can decrease how much space is under a bone in the shoulder joint (acromion), resulting in impingement. What are the causes? Shoulder impingement syndrome may be caused by bursitis or swelling of the rotator cuff tendons, which may result from: Repetitive overhead arm movements. Falling onto the shoulder. Weakness in the shoulder muscles. What increases the risk? You may be more likely to develop this condition if you: Play sports that involve throwing, such as baseball. Participate in sports such as tennis, volleyball, and swimming. Work as a Curator, Games developer, or Architect. Some people are also more likely to develop impingement syndrome because of the shape of their acromion bone. What are the signs or symptoms? The main symptom of this condition is pain on the front or side of the shoulder. The pain may: Get worse when lifting or raising the arm. Get worse at night. Wake you up from sleeping. Feel sharp when the shoulder is moved and then fade to an ache. Other symptoms may include: Tenderness. Stiffness. Inability to raise the arm above shoulder level or behind the body. Weakness. How is this diagnosed? This condition may be diagnosed based on: Your symptoms and medical history. A physical exam. Imaging tests, such as: X-rays. MRI. Ultrasound. How is this treated? This condition may be treated by: Resting your shoulder and avoiding all activities that  cause pain or put stress on the shoulder. Icing your shoulder. NSAIDs to help reduce pain and swelling. One or more injections of medicines to numb the area and reduce inflammation. Physical therapy. Surgery. This may be needed if nonsurgical treatments have not helped. Surgery may involve repairing the rotator cuff, reshaping the acromion, or removing the bursa. Follow these instructions at home: Managing pain, stiffness, and swelling  If directed, put ice on the injured area. Put ice in a plastic bag. Place a towel between your skin and the bag. Leave the ice on for 20 minutes, 2-3 times a day. Activity Rest and return to your normal activities as told by your health care provider. Ask your health care provider what activities are safe for you. Do exercises as told by your health care provider. General instructions Do not use any products that contain nicotine or tobacco, such as cigarettes, e-cigarettes, and chewing tobacco. These can delay healing. If you need help quitting, ask your health care provider. Ask your health care provider when it is safe for you to drive. Take over-the-counter and prescription medicines only as told by your health care provider. Keep all follow-up visits as told by your health care provider. This is important. How is this prevented? Give your body time to rest between periods of activity. Be safe and responsible while being active. This will help you avoid falls. Maintain physical fitness, including strength and flexibility. Contact a health care provider if: Your symptoms have not improved after 1-2 months of treatment and rest. You cannot lift your arm away from your body. Summary Shoulder impingement syndrome is a condition that causes pain when connective tissues (tendons) surrounding the shoulder joint  become pinched. The main symptom of this condition is pain on the front or side of the shoulder. This condition is usually treated with rest, ice,  and pain medicines as needed. This information is not intended to replace advice given to you by your health care provider. Make sure you discuss any questions you have with your health care provider. Document Revised: 03/13/2021 Document Reviewed: 03/13/2021 Elsevier Patient Education  [place].     Shoulder Impingement Syndrome Rehab Ask your health care provider which exercises are safe for you. Do exercises exactly as told by your health care provider and adjust them as directed. It is normal to feel mild stretching, pulling, tightness, or discomfort as you do these exercises. Stop right away if you feel sudden pain or your pain gets worse. Do not begin these exercises until told by your health care provider. Stretching and range-of-motion exercise This exercise warms up your muscles and joints and improves the movement and flexibility of your shoulder. This exercise also helps to relieve pain and stiffness. Passive horizontal adduction In passive adduction, you use your other hand to move the injured arm toward your body. The injured arm does not move on its own. In this movement, your arm is moved across your body in the horizontal plane (horizontal adduction). Sit or stand and pull your left / right elbow across your chest, toward your other shoulder. Stop when you feel a gentle stretch in the back of your shoulder and upper arm. Keep your arm at shoulder height. Keep your arm as close to your body as you comfortably can. Hold for __________ seconds. Slowly return to the starting position. Repeat __________ times. Complete this exercise __________ times a day. Strengthening exercises These exercises build strength and endurance in your shoulder. Endurance is the ability to use your muscles for a long time, even after they get tired. External rotation, isometric This is an exercise in which you press the back of your wrist against a door frame without moving your shoulder joint  (isometric). Stand or sit in a doorway, facing the door frame. Bend your left / right elbow and place the back of your wrist against the door frame. Only the back of your wrist should be touching the frame. Keep your upper arm at your side. Gently press your wrist against the door frame, as if you are trying to push your arm away from your abdomen (external rotation). Press as hard as you are able without pain. Avoid shrugging your shoulder while you press your wrist against the door frame. Keep your shoulder blade tucked down toward the middle of your back. Hold for __________ seconds. Slowly release the tension, and relax your muscles completely before you repeat the exercise. Repeat __________ times. Complete this exercise __________ times a day. Internal rotation, isometric This is an exercise in which you press your palm against a door frame without moving your shoulder joint (isometric). Stand or sit in a doorway, facing the door frame. Bend your left / right elbow and place the palm of your hand against the door frame. Only your palm should be touching the frame. Keep your upper arm at your side. Gently press your hand against the door frame, as if you are trying to push your arm toward your abdomen (internal rotation). Press as hard as you are able without pain. Avoid shrugging your shoulder while you press your hand against the door frame. Keep your shoulder blade tucked down toward the middle of your back.  Hold for __________ seconds. Slowly release the tension, and relax your muscles completely before you repeat the exercise. Repeat __________ times. Complete this exercise __________ times a day. Scapular protraction, supine  Lie on your back on a firm surface (supine position). Hold a __________ weight in your left / right hand. Raise your left / right arm straight into the air so your hand is directly above your shoulder joint. Push the weight into the air so your shoulder (scapula)  lifts off the surface that you are lying on. The scapula will push up or forward (protraction). Do not move your head, neck, or back. Hold for __________ seconds. Slowly return to the starting position. Let your muscles relax completely before you repeat this exercise. Repeat __________ times. Complete this exercise __________ times a day. Scapular retraction  Sit in a stable chair without armrests, or stand up. Secure an exercise band to a stable object in front of you so the band is at shoulder height. Hold one end of the exercise band in each hand. Your palms should face down. Squeeze your shoulder blades together (retraction) and move your elbows slightly behind you. Do not shrug your shoulders upward while you do this. Hold for __________ seconds. Slowly return to the starting position. Repeat __________ times. Complete this exercise __________ times a day. Shoulder extension  Sit in a stable chair without armrests, or stand up. Secure an exercise band to a stable object in front of you so the band is above shoulder height. Hold one end of the exercise band in each hand. Straighten your elbows and lift your hands up to shoulder height. Squeeze your shoulder blades together and pull your hands down to the sides of your thighs (extension). Stop when your hands are straight down by your sides. Do not let your hands go behind your body. Hold for __________ seconds. Slowly return to the starting position. Repeat __________ times. Complete this exercise __________ times a day. This information is not intended to replace advice given to you by your health care provider. Make sure you discuss any questions you have with your health care provider. Document Revised: 09/24/2018 Document Reviewed: 06/28/2018 Elsevier Patient Education  [place].

## 2021-12-23 ENCOUNTER — Encounter: Payer: Self-pay | Admitting: Nurse Practitioner

## 2021-12-23 ENCOUNTER — Ambulatory Visit: Payer: Medicare Other | Admitting: Nurse Practitioner

## 2021-12-23 VITALS — BP 94/58 | HR 66 | Temp 97.5°F | Wt 145.0 lb

## 2021-12-23 DIAGNOSIS — R239 Unspecified skin changes: Secondary | ICD-10-CM | POA: Diagnosis not present

## 2021-12-23 DIAGNOSIS — M778 Other enthesopathies, not elsewhere classified: Secondary | ICD-10-CM

## 2021-12-23 DIAGNOSIS — D709 Neutropenia, unspecified: Secondary | ICD-10-CM

## 2021-12-23 LAB — CBC WITH DIFFERENTIAL/PLATELET
Absolute Monocytes: 279 cells/uL (ref 200–950)
Basophils Absolute: 21 cells/uL (ref 0–200)
Basophils Relative: 0.7 %
Eosinophils Absolute: 69 cells/uL (ref 15–500)
Eosinophils Relative: 2.3 %
HCT: 37.1 % (ref 35.0–45.0)
Hemoglobin: 12.4 g/dL (ref 11.7–15.5)
Lymphs Abs: 1485 cells/uL (ref 850–3900)
MCH: 29.2 pg (ref 27.0–33.0)
MCHC: 33.4 g/dL (ref 32.0–36.0)
MCV: 87.3 fL (ref 80.0–100.0)
MPV: 11.6 fL (ref 7.5–12.5)
Monocytes Relative: 9.3 %
Neutro Abs: 1146 cells/uL — ABNORMAL LOW (ref 1500–7800)
Neutrophils Relative %: 38.2 %
Platelets: 184 10*3/uL (ref 140–400)
RBC: 4.25 10*6/uL (ref 3.80–5.10)
RDW: 13.1 % (ref 11.0–15.0)
Total Lymphocyte: 49.5 %
WBC: 3 10*3/uL — ABNORMAL LOW (ref 3.8–10.8)

## 2021-12-23 NOTE — Progress Notes (Signed)
Assessment and Plan:  [name] was seen today for a follow up and new problem.  Diagnoses and all order for this visit:  1. Skin change Continue to monitor. Wear wide brim hat and sunscreen daily.  - Ambulatory referral to Dermatology  2. Neutropenia, unspecified type ([place])  - CBC with Differential/Platelet  3. Right shoulder tendonitis Suggested IBU with food when flared. May take Tylenol if IBU is too upsetting for stomach.   RICE when flared. Continue to monitor  - Ambulatory referral to Physical Therapy  Notify office for further evaluation and treatment, questions or concerns if s/s fail to improve. The risks and benefits of my recommendations, as well as other treatment options were discussed with the patient today. Questions were answered.  Further disposition pending results of labs. Discussed med's effects and SE's.    Over 20 minutes of exam, counseling, chart review, and critical decision making was performed.   Future Appointments  Date Time Provider [place]  04/03/2022 10:00 AM Unk Pinto, MD [facility] None  07/23/2022  9:40 AM Philemon Kingdom, MD [facility] None  10/03/2022  9:00 AM Alycia Rossetti, NP [facility] None    ------------------------------------------------------------------------------------------------------------------   HPI BP (!) 94/58   Pulse 66   Temp (!) 97.5 F (36.4 C)   Wt 145 lb (65.8 kg)   SpO2 99%   BMI 24.89 kg/m   Subjective:     Myriah Boggus Garrett is a 71 y.o. female who was referred to me for evaluation and treatment of a skin lesion of the forearm and head. The lesion has been present for  years  . Lesion has not changed in  years  Symptoms associated with the lesion are: darkening color, none. Patient denies none.  She has not followed with Dermatology in the past.  Requesting to have full body skin check.  She is also following up on bilateral shoulder pain, tendonitis  R>L.  She has implemented  the RICE method with rest.  Steroid taper completed, however continues to have pain and not improving.  Additionally she is following up on Neutropenia.  She has been taking B12 as directed.    Past Medical History:  Diagnosis Date   Arthritis    cervical and lumbar   Cataract    Mixed OU   Costochondritis 08/27/2020   Depression    GERD (gastroesophageal reflux disease)    Hyperlipidemia    IBS (irritable bowel syndrome)    Osteoporosis    Prediabetes    Vitamin D deficiency      Allergies  Allergen Reactions   Bactrim [Sulfamethoxazole-Trimethoprim] Other (See Comments)    Patient preference to take medication without sulfa   Cephalexin Nausea And Vomiting    Rash; chest pain Rash; chest pain   Sulfa Antibiotics     Current Outpatient Medications on File Prior to Visit  Medication Sig   aspirin 81 MG tablet Take 81 mg by mouth daily.   Bacillus Coagulans-Inulin (ALIGN PREBIOTIC-PROBIOTIC PO) Take 1 tablet by mouth daily.   Calcium Carbonate-Vit D-Min (CALCIUM 1200 PO) Take by mouth.   Cholecalciferol 125 MCG (5000 UT) capsule Take 5,000 Units by mouth daily.   dexamethasone (DECADRON) 2 MG tablet Take 1 tab 3 x day for 3 days, then 2 x day for 3 days, then 1 tab daily   ezetimibe (ZETIA) 10 MG tablet Take 1 tablet Dail for Cholesterol   Fluticasone Propionate (FLONASE ALLERGY RELIEF NA) Place 2 sprays into the nose as needed.  Hyoscyamine Sulfate (HYOSCYAMINE PO) Take by mouth as needed.   loratadine (CLARITIN) 10 MG tablet Take 1 tablet (10 mg total) by mouth 2 (two) times daily as needed for allergies (Can take an extra dose during flare ups.). Take 1 tablet 1-2 times daily   Lutein 20 MG TABS Take by mouth.   Magnesium 250 MG TABS    Multiple Vitamins-Minerals (AIRBORNE) CHEW Chew 1 tablet by mouth as needed.   pantoprazole (PROTONIX) 40 MG tablet Take 1 tablet (40 mg total) by mouth 2 (two) times daily.   Polyethylene Glycol 3350 (MIRALAX PO) Take by mouth at  bedtime.   RESTASIS 0.05 % ophthalmic emulsion instill 1 drop into both eyes twice a day   triamcinolone (NASACORT ALLERGY 24HR) 55 MCG/ACT AERO nasal inhaler Place 2 sprays into the nose 2 (two) times daily. 1-2 sprays per nostril 1-7 times per week.   vitamin B-12 (CYANOCOBALAMIN) 1000 MCG tablet Take 1,000 mcg by mouth 3 (three) times a week.   No current facility-administered medications on file prior to visit.    ROS: all negative except what is noted in the HPI.   ROS   Physical Exam:  BP (!) 94/58   Pulse 66   Temp (!) 97.5 F (36.4 C)   Wt 145 lb (65.8 kg)   SpO2 99%   BMI 24.89 kg/m   General Appearance: NAD.  Awake, conversant and cooperative. Eyes: PERRLA, EOMs intact.  Sclera white.  Conjunctiva without erythema. Sinuses: No frontal/maxillary tenderness.  No nasal discharge. Nares patent.  ENT/Mouth: Ext aud canals clear.  Bilateral TMs w/DOL and without erythema or bulging. Hearing intact.  Posterior pharynx without swelling or exudate.  Tonsils without swelling or erythema.  Neck: Supple.  No masses, nodules or thyromegaly. Respiratory: Effort is regular with non-labored breathing. Breath sounds are equal bilaterally without rales, rhonchi, wheezing or stridor.  Cardio: RRR with no MRGs. Brisk peripheral pulses without edema.  Abdomen: Active BS in all four quadrants.  Soft and non-tender without guarding, rebound tenderness, hernias or masses. Lymphatics: Non tender without lymphadenopathy.  Musculoskeletal: Full ROM, 5/5 strength, normal ambulation.  No clubbing or cyanosis. Skin: Appropriate color for ethnicity. Warm without rashes, lesions, ecchymosis, ulcers.  Neuro: CN II-XII grossly normal. Normal muscle tone without cerebellar symptoms and intact sensation.   Psych: AO X 3,  appropriate mood and affect, insight and judgment.     Darrol Jump, NP 2:31 PM [facility]

## 2021-12-23 NOTE — Patient Instructions (Signed)
Mole A mole is a colored (pigmented) growth on the skin. Moles are very common. They are usually harmless, but some moles can become cancerous over time. What are the causes? Moles are caused when pigmented skin cells grow together in clusters instead of spreading out in the skin as they normally do. The reason why the skin cells grow together in clusters is not known. What increases the risk? You are more likely to develop a mole if: You have family members who have moles. You are fair skinned. You have red or blond hair. You are often outdoors and exposed to the sun. You received phototherapy when you were a newborn baby. You are female. What are the signs or symptoms? A mole may occur anywhere on your skin. A mole may be: Brown or another color. Although moles are most often brown, they can also be tan, black, red, pink, blue, skin-toned, or colorless. Flat or raised. Smooth or wrinkled. Round in shape. How is this diagnosed? A mole is diagnosed with a skin exam. If your health care provider thinks a mole may be cancerous, all or part of the mole will be removed for testing (biopsy). How is this treated? Most moles are noncancerous (benign) and do not require treatment. If a mole is found to be cancerous, it will be removed. You may also choose to have a mole removed if it is causing pain or if you do not like the way it looks. Follow these instructions at home: General instructions  Every month, look for new moles and check your existing moles for changes. This is important because a change in a mole can mean that the mole has become cancerous. ABCDE changes in a mole indicate that you should be evaluated by your health care provider. ABCDE stands for: Asymmetry. This means the mole has an irregular shape. It is not round or oval. Border. This means the mole has an irregular or bumpy border. Color. This means the mole has multiple colors in it, including brown, black, blue, red, or  tan. Note that it is normal for moles to get darker when a woman is pregnant or takes birth control pills. Diameter. This means the mole is more than 0.2 inches (6 mm) across. Evolving. This refers to any unusual changes or symptoms in the mole, such as pain, itching, stinging, sensitivity, or bleeding. If you have a large number of moles, see a skin doctor (dermatologist) at least one time every year for a full-body skin check. Lifestyle  When you are outdoors, wear sunscreen with SPF 30 (sun protection factor 30) or higher. Use an adequate amount of sunscreen to cover exposed areas of skin. Put it on 30 minutes before you go out. Reapply it every 2 hours or anytime you come out of the water. When you are out in the sun, wear a broad-brimmed hat and clothing that covers your arms and legs. Wear wraparound sunglasses. Contact a health care provider if: The size, shape, borders, or color of your mole changes. Your mole, or the skin near the mole, becomes painful, sore, red, or swollen. Your mole: Develops more than one color. Itches or bleeds. Becomes scaly, sheds skin, or oozes fluid. Becomes flat or develops raised areas. Becomes hard or soft. You develop a new mole. Summary A mole is a colored (pigmented) growth on the skin. Moles are very common. They are usually harmless, but some moles can become cancerous over time. Every month, look for new moles and check your   existing moles for changes. This is important because a change in a mole can mean that the mole has become cancerous. If you have a large number of moles, see a skin doctor (dermatologist) at least one time every year for a full-body skin check. When you are outdoors, wear sunscreen with SPF 30 (sun protection factor 30) or higher. Reapply it every 2 hours or anytime you come out of the water. Contact a health care provider if you notice changes in a mole or if you develop a new mole. This information is not intended to replace  advice given to you by your health care provider. Make sure you discuss any questions you have with your health care provider. Document Revised: 02/21/2021 Document Reviewed: 02/21/2021 Elsevier Patient Education  2023 [employer]  

## 2021-12-25 ENCOUNTER — Telehealth: Payer: Self-pay | Admitting: Nurse Practitioner

## 2021-12-25 ENCOUNTER — Other Ambulatory Visit: Payer: Self-pay | Admitting: Nurse Practitioner

## 2021-12-25 DIAGNOSIS — D709 Neutropenia, unspecified: Secondary | ICD-10-CM

## 2021-12-25 NOTE — Telephone Encounter (Signed)
Patient would like to follow through with the ref. To Hematology.

## 2021-12-27 ENCOUNTER — Telehealth: Payer: Self-pay | Admitting: Physician Assistant

## 2021-12-27 NOTE — Telephone Encounter (Signed)
Scheduled appt per 7/12 referral. Pt is aware of appt date and time. Pt is aware to arrive 15 mins prior to appt time and to bring and updated insurance card. Pt is aware of appt location.   

## 2022-01-13 ENCOUNTER — Ambulatory Visit: Payer: Medicare Other | Attending: Internal Medicine

## 2022-01-13 IMAGING — CR DG CERVICAL SPINE COMPLETE 4+V
6 series · 6 of 6 positions shown · non-contrast
Comparison: July 22, 2012

CLINICAL DATA: Neck pain

EXAM:
CERVICAL SPINE - COMPLETE 4+ VIEW

[w cervical spine lat]
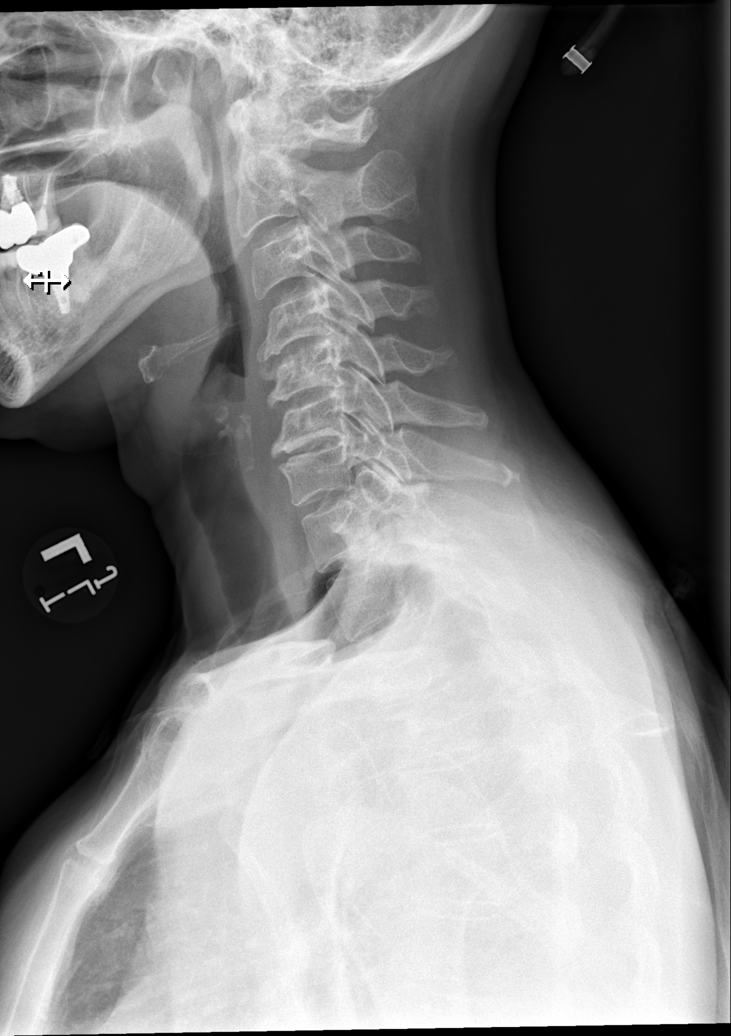

[w cervical spine ap_obl (1 of 2)]
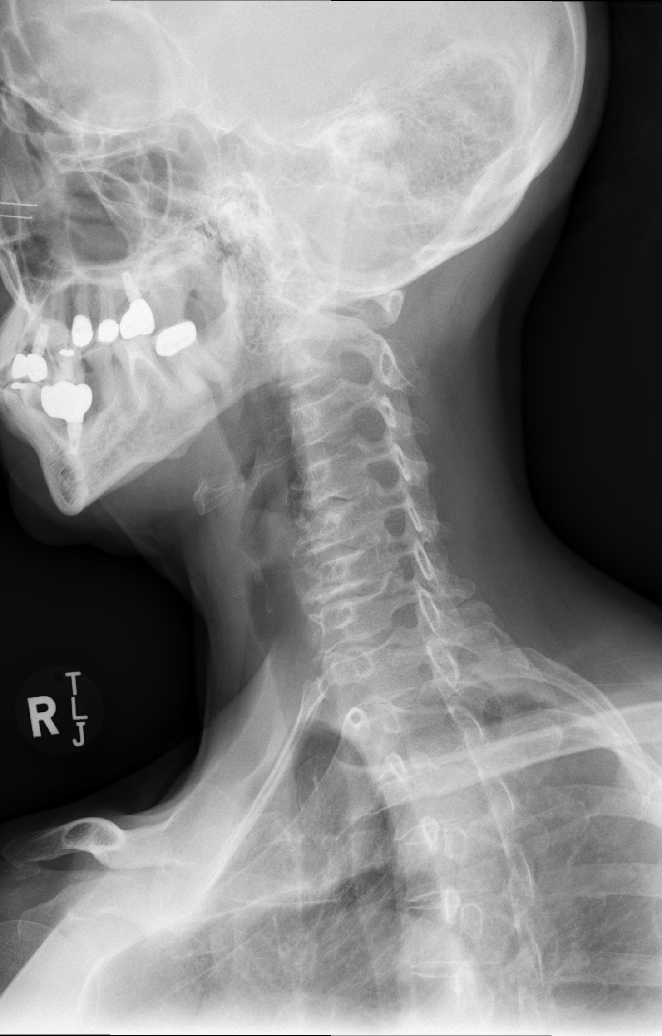

[w cervical spine ap_obl (2 of 2)]
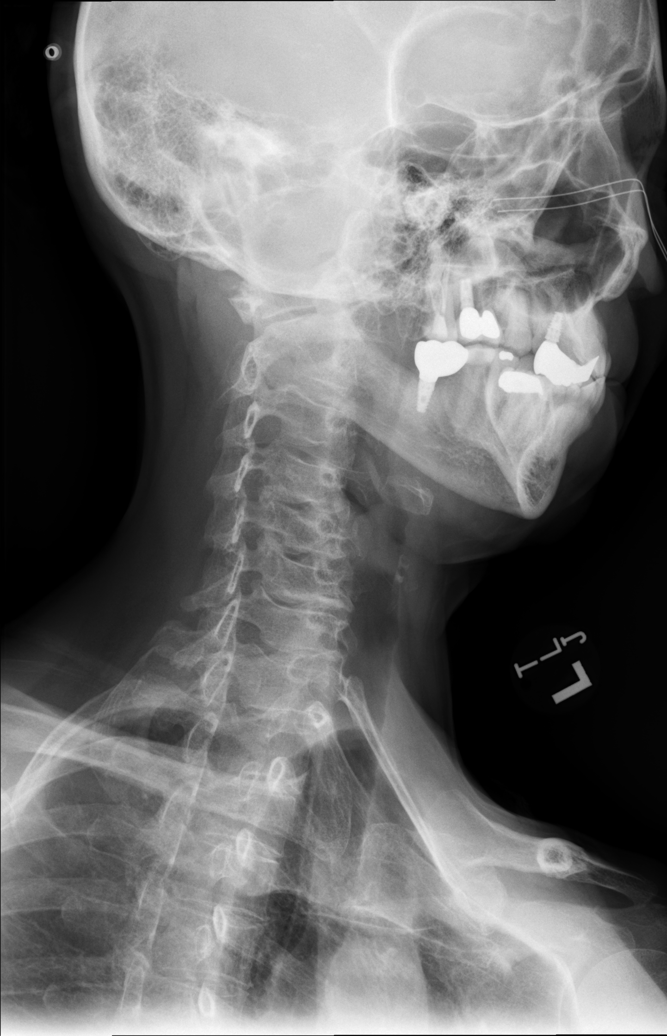

[w cervical spine ap]
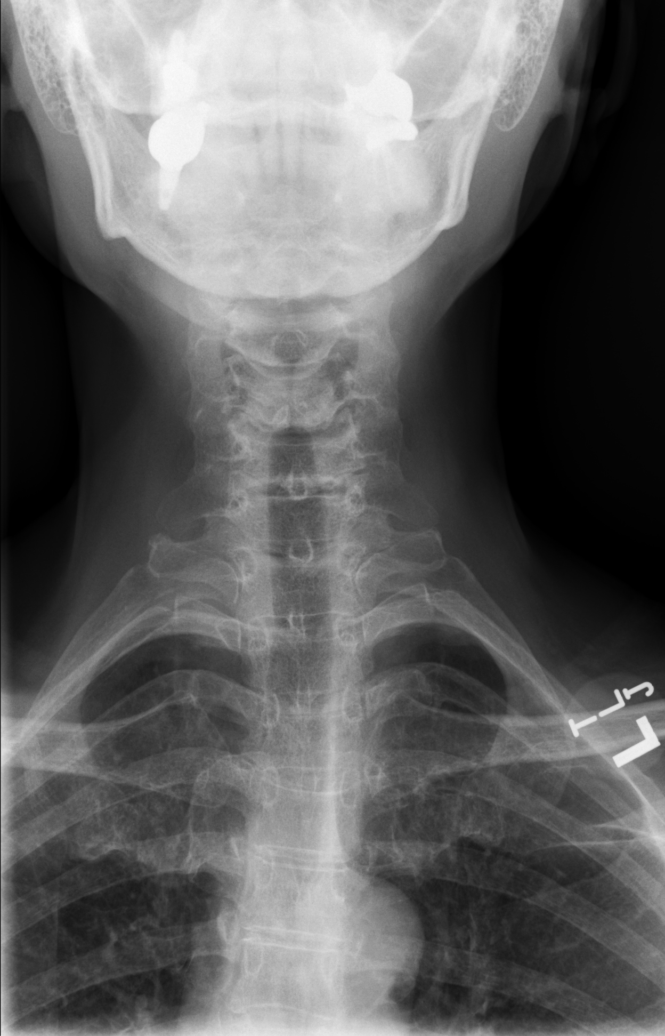

[w cervical spine odontoid (1 of 2)]
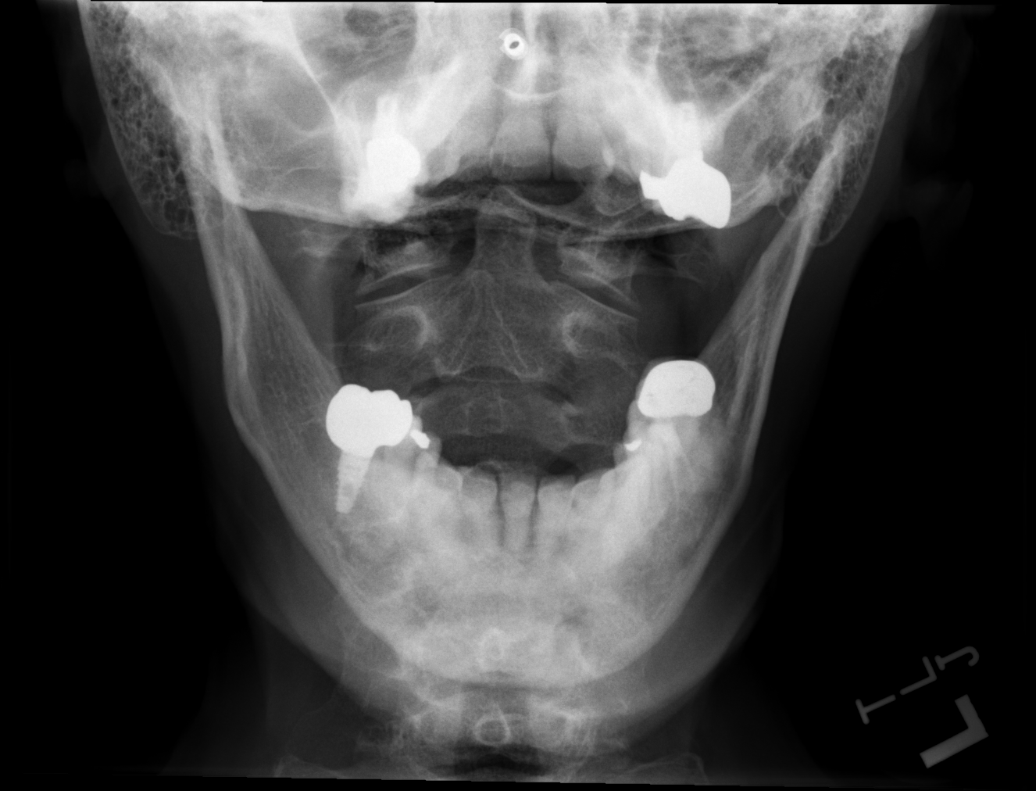

[w cervical spine odontoid (2 of 2)]
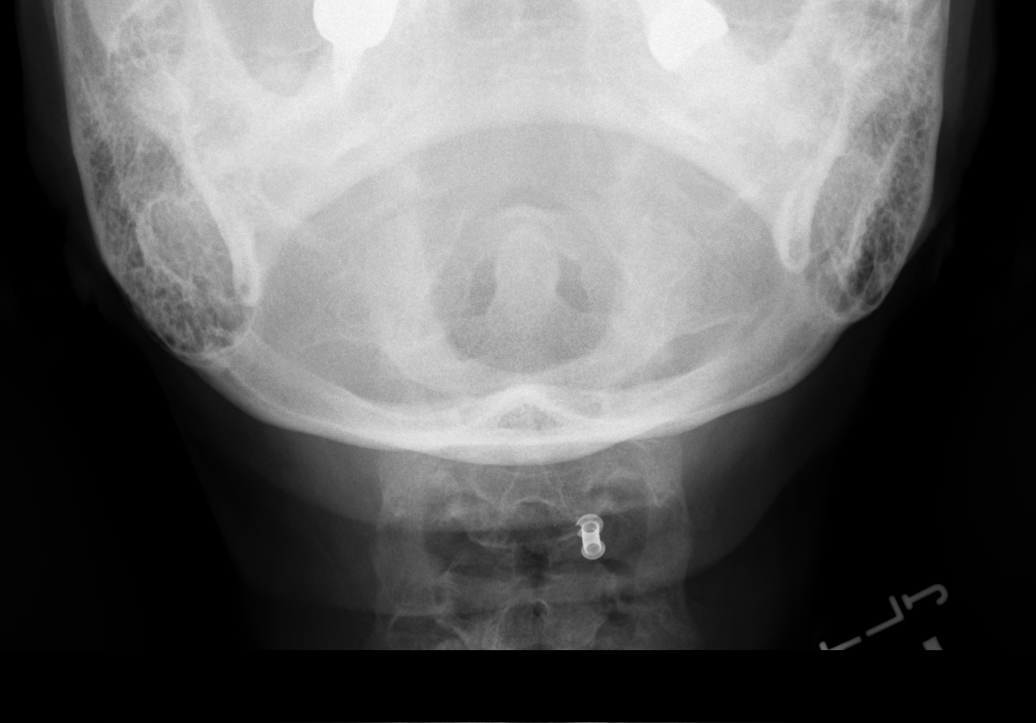

[6 of 6 positions shown; findings below may reference images not displayed]

FINDINGS: Prevertebral soft tissues are normal. Cervical spine visualized
through the top of T2.

Mild reversal of normal cervical lordosis at C3-4 in the setting of
spinal degenerative changes.

Degenerative changes with disc space narrowing and uncovertebral
degenerative spurring at C4-5, C5-6 and C6-7. LEFT-sided moderate
neural foraminal narrowing at C6-7.

RIGHT-sided mild neural foraminal narrowing at C4-5, C5-6 with
moderate narrowing at C6-7. No sign of acute fracture or static
malalignment.
IMPRESSION: 1. Degenerative changes in the cervical spine, disc space narrowing
and uncovertebral degenerative spurring.
2. Bilateral neural foraminal narrowing, moderate at C6-7.
3. No acute findings.

## 2022-01-13 NOTE — Therapy (Deleted)
OUTPATIENT PHYSICAL THERAPY SHOULDER EVALUATION   Patient Name: [name] MRN: [id number] DOB:[birth year], 71 y.o., female Today's Date: 01/13/2022    Past Medical History:  Diagnosis Date   Arthritis    cervical and lumbar   Cataract    Mixed OU   Costochondritis 08/27/2020   Depression    GERD (gastroesophageal reflux disease)    Hyperlipidemia    IBS (irritable bowel syndrome)    Osteoporosis    Prediabetes    Vitamin D deficiency    Past Surgical History:  Procedure Laterality Date   CARDIAC CATHETERIZATION  2011   normal (Dr. 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   Upper airway cough syndrome 05/04/2019   Pain in right knee 12/27/2018   Bigeminy 03/25/2018   Osteoporosis 08/11/2017   Abnormal glucose 06/25/2017   Medication management 12/12/2014   Labile hypertension 08/09/2014   GERD (gastroesophageal reflux disease)    Vitamin D deficiency    Hyperlipidemia, mixed     PCP: Unk Pinto, MD  REFERRING PROVIDER: Unk Pinto, MD  REFERRING DIAG: M77.8 (ICD-10-CM) - Right shoulder tendonitis   THERAPY DIAG: Right shoulder tendonitis    Rationale for Evaluation and Treatment Rehabilitation  ONSET DATE: 11/15/21  SUBJECTIVE:                                                                                                                                                                                      SUBJECTIVE  STATEMENT: ***  PERTINENT HISTORY: She is R handed, has noted intermittent posterior shoulder/deltoid area pain x 2-3 months, started after strained shoulder reaching into the back seat. Bothers her when reaching behind her or extended lifting with shoulder, will radiate to elbow. No limitations in ROM. Denies neck pain or alterations in sensation, radicular sx to hand. Hasn't tried anything thus far -   PAIN:  Are you having pain? {OPRCPAIN:27236}  PRECAUTIONS: None  WEIGHT BEARING RESTRICTIONS No  FALLS:  Has patient fallen in last 6 months? No  LIVING ENVIRONMENT: Lives with: lives with their family Lives in: House/apartment Stairs:  yes Has following equipment at home: None  OCCUPATION: ***  PLOF: Independent  PATIENT GOALS To reduce an  manage my shoulder pain  OBJECTIVE:   DIAGNOSTIC FINDINGS:  none  PATIENT SURVEYS:  FOTO ***  COGNITION:  Overall cognitive status: Within functional limits for tasks assessed     SENSATION: Not tested  POSTURE: ***  UPPER EXTREMITY ROM:   {AROM/PROM:27142} ROM Right eval Left eval  Shoulder flexion    Shoulder extension    Shoulder abduction    Shoulder adduction    Shoulder internal rotation    Shoulder external rotation    Elbow flexion    Elbow extension    Wrist flexion    Wrist extension    Wrist ulnar deviation    Wrist radial deviation    Wrist pronation    Wrist supination    (Blank rows = not tested)  UPPER EXTREMITY MMT:  MMT Right eval Left eval  Shoulder flexion    Shoulder extension    Shoulder abduction    Shoulder adduction    Shoulder internal rotation    Shoulder external rotation    Middle trapezius    Lower trapezius    Elbow flexion    Elbow extension    Wrist flexion    Wrist extension    Wrist ulnar deviation    Wrist radial deviation    Wrist pronation    Wrist supination    Grip strength (lbs)    (Blank rows = not tested)  SHOULDER SPECIAL TESTS:  Impingement  tests: {shoulder impingement test:25231:a}  SLAP lesions: {SLAP lesions:25232}  Instability tests: {shoulder instability test:25233}  Rotator cuff assessment: {rotator cuff assessment:25234}  Biceps assessment: {biceps assessment:25235}  JOINT MOBILITY TESTING:  ***  PALPATION:  ***   TODAY'S TREATMENT:  ***   PATIENT EDUCATION: Education details: Discussed eval findings, rehab rationale and POC and patient is in agreement  Person educated: Patient Education method: Explanation and Handouts Education comprehension: verbalized understanding and needs further education   HOME EXERCISE PROGRAM: ***  ASSESSMENT:  CLINICAL IMPRESSION: Patient is a 71 y.o. female who was seen today for physical therapy evaluation and treatment for R shoulder pain/tendinitis.    OBJECTIVE IMPAIRMENTS decreased knowledge of condition, decreased mobility, decreased strength, impaired UE functional use, postural dysfunction, and pain.   ACTIVITY LIMITATIONS carrying, lifting, dressing, and reach over head  PARTICIPATION LIMITATIONS: {participationrestrictions:25113}  PERSONAL FACTORS {Personal factors:25162} are also affecting patient's functional outcome.   REHAB POTENTIAL: Good  CLINICAL DECISION MAKING: Stable/uncomplicated  EVALUATION COMPLEXITY: Low   GOALS: Goals reviewed with patient? Yes  SHORT TERM GOALS: Target date: 01/27/2022    Patient to demonstrate independence in HEP  Baseline: Goal status: INITIAL  2.  *** Baseline:  Goal status: {GOALSTATUS:25110}  3.  *** Baseline:  Goal status: {GOALSTATUS:25110}  4.  *** Baseline:  Goal status: {GOALSTATUS:25110}  5.  *** Baseline:  Goal status: {GOALSTATUS:25110}  6.  *** Baseline:  Goal status: {GOALSTATUS:25110}  LONG TERM GOALS: Target date: {follow up:25551}    *** Baseline:  Goal status: {GOALSTATUS:25110}  2.  *** Baseline:  Goal status: {GOALSTATUS:25110}  3.  *** Baseline:  Goal status:  {GOALSTATUS:25110}  4.  *** Baseline:  Goal status: {GOALSTATUS:25110}  5.  *** Baseline:  Goal status: {GOALSTATUS:25110}  6.  *** Baseline:  Goal status: {GOALSTATUS:25110}   PLAN: PT FREQUENCY: 1-2x/week  PT DURATION: 4 weeks  PLANNED INTERVENTIONS: Therapeutic exercises, Therapeutic activity, Neuromuscular re-education, Balance training, Gait training, Patient/Family education, Self Care, Joint mobilization, Dry Needling, Taping, Manual therapy, and Re-evaluation  PLAN FOR NEXT SESSION: ***   Lanice Shirts,  PT 01/13/2022, 10:32 AM

## 2022-01-22 ENCOUNTER — Other Ambulatory Visit: Payer: Self-pay

## 2022-01-22 ENCOUNTER — Encounter: Payer: Self-pay | Admitting: Physician Assistant

## 2022-01-22 ENCOUNTER — Inpatient Hospital Stay: Payer: Medicare Other

## 2022-01-22 ENCOUNTER — Inpatient Hospital Stay: Payer: Medicare Other | Attending: Physician Assistant | Admitting: Physician Assistant

## 2022-01-22 VITALS — BP 100/63 | HR 58 | Temp 97.7°F | Resp 20 | Wt 142.4 lb

## 2022-01-22 DIAGNOSIS — D708 Other neutropenia: Secondary | ICD-10-CM

## 2022-01-22 DIAGNOSIS — K219 Gastro-esophageal reflux disease without esophagitis: Secondary | ICD-10-CM | POA: Diagnosis not present

## 2022-01-22 DIAGNOSIS — K589 Irritable bowel syndrome without diarrhea: Secondary | ICD-10-CM | POA: Insufficient documentation

## 2022-01-22 DIAGNOSIS — D709 Neutropenia, unspecified: Secondary | ICD-10-CM | POA: Diagnosis present

## 2022-01-22 DIAGNOSIS — Z7982 Long term (current) use of aspirin: Secondary | ICD-10-CM | POA: Diagnosis not present

## 2022-01-22 DIAGNOSIS — R5383 Other fatigue: Secondary | ICD-10-CM | POA: Diagnosis not present

## 2022-01-22 DIAGNOSIS — Z7952 Long term (current) use of systemic steroids: Secondary | ICD-10-CM | POA: Insufficient documentation

## 2022-01-22 DIAGNOSIS — E785 Hyperlipidemia, unspecified: Secondary | ICD-10-CM | POA: Insufficient documentation

## 2022-01-22 LAB — CBC WITH DIFFERENTIAL (CANCER CENTER ONLY)
Abs Immature Granulocytes: 0 10*3/uL (ref 0.00–0.07)
Basophils Absolute: 0 10*3/uL (ref 0.0–0.1)
Basophils Relative: 1 %
Eosinophils Absolute: 0.1 10*3/uL (ref 0.0–0.5)
Eosinophils Relative: 3 %
HCT: 36.2 % (ref 36.0–46.0)
Hemoglobin: 12.4 g/dL (ref 12.0–15.0)
Immature Granulocytes: 0 %
Lymphocytes Relative: 50 %
Lymphs Abs: 1.6 10*3/uL (ref 0.7–4.0)
MCH: 29.1 pg (ref 26.0–34.0)
MCHC: 34.3 g/dL (ref 30.0–36.0)
MCV: 85 fL (ref 80.0–100.0)
Monocytes Absolute: 0.2 10*3/uL (ref 0.1–1.0)
Monocytes Relative: 7 %
Neutro Abs: 1.2 10*3/uL — ABNORMAL LOW (ref 1.7–7.7)
Neutrophils Relative %: 39 %
Platelet Count: 161 10*3/uL (ref 150–400)
RBC: 4.26 MIL/uL (ref 3.87–5.11)
RDW: 13.1 % (ref 11.5–15.5)
WBC Count: 3.2 10*3/uL — ABNORMAL LOW (ref 4.0–10.5)
nRBC: 0 % (ref 0.0–0.2)

## 2022-01-22 LAB — CMP (CANCER CENTER ONLY)
ALT: 17 U/L (ref 0–44)
AST: 23 U/L (ref 15–41)
Albumin: 4.1 g/dL (ref 3.5–5.0)
Alkaline Phosphatase: 64 U/L (ref 38–126)
Anion gap: 5 (ref 5–15)
BUN: 12 mg/dL (ref 8–23)
CO2: 26 mmol/L (ref 22–32)
Calcium: 9.6 mg/dL (ref 8.9–10.3)
Chloride: 106 mmol/L (ref 98–111)
Creatinine: 0.83 mg/dL (ref 0.44–1.00)
GFR, Estimated: >60 mL/min (ref >60)
Glucose, Bld: 86 mg/dL (ref 70–99)
Potassium: 4.1 mmol/L (ref 3.5–5.1)
Sodium: 137 mmol/L (ref 135–145)
Total Bilirubin: 0.6 mg/dL (ref 0.3–1.2)
Total Protein: 7.1 g/dL (ref 6.5–8.1)

## 2022-01-22 LAB — VITAMIN B12: Vitamin B-12: 724 pg/mL (ref 180–914)

## 2022-01-22 LAB — HEPATITIS B CORE ANTIBODY, TOTAL: Hep B Core Total Ab: NONREACTIVE

## 2022-01-22 LAB — HEPATITIS B SURFACE ANTIBODY,QUALITATIVE: Hep B S Ab: NONREACTIVE

## 2022-01-22 LAB — SAVE SMEAR(SSMR), FOR PROVIDER SLIDE REVIEW

## 2022-01-22 NOTE — Progress Notes (Signed)
[place] [phone]   Fax:([phone] [place] NOTE  Patient Care Team: Unk Pinto, MD as PCP - General (Internal Medicine) Juanita Craver, MD as Consulting Physician (Gastroenterology) Monna Fam, MD as Consulting Physician (Ophthalmology) Charolette Forward, MD as Consulting Physician (Cardiology)  CHIEF COMPLAINTS/PURPOSE OF CONSULTATION:  Neutropenia  HISTORY OF PRESENTING ILLNESS:  [name] 71 y.o. female with medical history significant for IBS, vitamin D deficiency, hyperlipidemia, GERD, osteoporosis and prediabetes presents to the hematology department for initial evaluation for neutropenia. She is accompanied by her husband for this visit.  On review of the previous records, Ms. Mackley reports long standing neutropenia as far back as 2014.  Most recent labs from 12/23/2021 show white blood cell count 3.0, ANC 1.146.  On exam today, Ms. Lattin is feeling well without any new or concerning symptoms.  She has the occasional fatigue but is able to complete most of her ADLs on her own.  She has a good appetite and denies any weight changes.  She does report occasional nausea with constipation secondary to longstanding IBS.  She denies easy bruising or signs of active bleeding except for the occasional gum or nose bleeds.She denies fevers, chills, sweats, shortness of breath, chest pain or cough. She has no other complaints. Rest of the 10 point ROS is below.   MEDICAL HISTORY:  Past Medical History:  Diagnosis Date   Arthritis    cervical and lumbar   Cataract    Mixed OU   Costochondritis 08/27/2020   Depression    GERD (gastroesophageal reflux disease)    Hyperlipidemia    IBS (irritable bowel syndrome)    Osteoporosis    Prediabetes    Vitamin D deficiency     SURGICAL HISTORY: Past Surgical History:  Procedure Laterality Date   CARDIAC CATHETERIZATION  2011   normal (Dr. Terrence Dupont)   RADIOACTIVE SEED GUIDED  EXCISIONAL BREAST BIOPSY Right 03/11/2016   Procedure: RIGHT RADIOACTIVE SEED GUIDED EXCISIONAL BREAST BIOPSY;  Surgeon: Rolm Bookbinder, MD;  Location: [place];  Service: General;  Laterality: Right;    SOCIAL HISTORY: Social History   Socioeconomic History   Marital status: Married    Spouse name: Not on file   Number of children: 0   Years of education: Not on file   Highest education level: Not on file  Occupational History   Occupation: retired Copywriter, advertising  Tobacco Use   Smoking status: Never    Passive exposure: Never   Smokeless tobacco: Never  Vaping Use   Vaping Use: Never used  Substance and Sexual Activity   Alcohol use: Not Currently   Drug use: No   Sexual activity: Yes    Birth control/protection: Post-menopausal    Comment: 1st intercourse 75 yo-5 partners  Other Topics Concern   Not on file  Social History Narrative   Lives at home with spouse   Right handed   Caffeine: not much, drinks decaf 2 cups/day   Social Determinants of Health   Financial Resource Strain: Not on file  Food Insecurity: Not on file  Transportation Needs: Not on file  Physical Activity: Not on file  Stress: Not on file  Social Connections: Not on file  Intimate Partner Violence: Not on file    FAMILY HISTORY: Family History  Problem Relation Age of Onset   Hypertension Mother    Other Mother        surgeries involving upper back   Diabetes Father  Heart failure Father    Cancer Maternal Grandmother        Lung   Leukemia Maternal Aunt    Allergic rhinitis Neg Hx    Angioedema Neg Hx    Asthma Neg Hx    Atopy Neg Hx    Eczema Neg Hx    Immunodeficiency Neg Hx    Urticaria Neg Hx    Migraines Neg Hx     ALLERGIES:  is allergic to bactrim [sulfamethoxazole-trimethoprim], cephalexin, and sulfa antibiotics.  MEDICATIONS:  Current Outpatient Medications  Medication Sig Dispense Refill   aspirin 81 MG tablet Take 81 mg by mouth daily.      Bacillus Coagulans-Inulin (ALIGN PREBIOTIC-PROBIOTIC PO) Take 1 tablet by mouth daily.     Calcium Carbonate-Vit D-Min (CALCIUM 1200 PO) Take by mouth.     Cholecalciferol 125 MCG (5000 UT) capsule Take 5,000 Units by mouth daily.     dexamethasone (DECADRON) 2 MG tablet Take 1 tab 3 x day for 3 days, then 2 x day for 3 days, then 1 tab daily 20 tablet 0   ezetimibe (ZETIA) 10 MG tablet Take 1 tablet Dail for Cholesterol 90 tablet 3   Fluticasone Propionate (FLONASE ALLERGY RELIEF NA) Place 2 sprays into the nose as needed.     Hyoscyamine Sulfate (HYOSCYAMINE PO) Take by mouth as needed.     loratadine (CLARITIN) 10 MG tablet Take 1 tablet (10 mg total) by mouth 2 (two) times daily as needed for allergies (Can take an extra dose during flare ups.). Take 1 tablet 1-2 times daily 60 tablet 11   Lutein 20 MG TABS Take by mouth.     Magnesium 250 MG TABS      Multiple Vitamins-Minerals (AIRBORNE) CHEW Chew 1 tablet by mouth as needed.     nitroGLYCERIN (NITROSTAT) 0.4 MG SL tablet Place 0.4 mg under the tongue every 5 (five) minutes as needed for chest pain.     pantoprazole (PROTONIX) 40 MG tablet Take 1 tablet (40 mg total) by mouth 2 (two) times daily. 60 tablet 11   Polyethylene Glycol 3350 (MIRALAX PO) Take by mouth at bedtime.     RESTASIS 0.05 % ophthalmic emulsion instill 1 drop into both eyes twice a day  0   triamcinolone (NASACORT ALLERGY 24HR) 55 MCG/ACT AERO nasal inhaler Place 2 sprays into the nose 2 (two) times daily. 1-2 sprays per nostril 1-7 times per week. 16.9 g 11   vitamin B-12 (CYANOCOBALAMIN) 1000 MCG tablet Take 1,000 mcg by mouth 3 (three) times a week.     No current facility-administered medications for this visit.    REVIEW OF SYSTEMS:   Constitutional: ( - ) fevers, ( - )  chills , ( - ) night sweats Eyes: ( - ) blurriness of vision, ( - ) double vision, ( - ) watery eyes Ears, nose, mouth, throat, and face: ( - ) mucositis, ( - ) sore throat Respiratory: ( -  ) cough, ( - ) dyspnea, ( - ) wheezes Cardiovascular: ( - ) palpitation, ( - ) chest discomfort, ( - ) lower extremity swelling Gastrointestinal:  ( - ) nausea, ( - ) heartburn, ( - ) change in bowel habits Skin: ( - ) abnormal skin rashes Lymphatics: ( - ) new lymphadenopathy, ( - ) easy bruising Neurological: ( - ) numbness, ( - ) tingling, ( - ) new weaknesses Behavioral/Psych: ( - ) mood change, ( - ) new changes  All other systems were reviewed  with the patient and are negative.  PHYSICAL EXAMINATION: ECOG PERFORMANCE STATUS: 0 - Asymptomatic  Vitals:   01/22/22 1412  BP: 100/63  Pulse: (!) 58  Resp: 20  Temp: 97.7 F (36.5 C)  SpO2: 99%   Filed Weights   01/22/22 1412  Weight: 142 lb 6.4 oz (64.6 kg)    GENERAL: well appearing female in NAD  SKIN: skin color, texture, turgor are normal, no rashes or significant lesions EYES: conjunctiva are pink and non-injected, sclera clear OROPHARYNX: no exudate, no erythema; lips, buccal mucosa, and tongue normal  NECK: supple, non-tender LYMPH:  no palpable lymphadenopathy in the cervical or supraclavicular lymph nodes.  LUNGS: clear to auscultation and percussion with normal breathing effort HEART: regular rate & rhythm and no murmurs and no lower extremity edema ABDOMEN: soft, non-tender, non-distended, normal bowel sounds Musculoskeletal: no cyanosis of digits and no clubbing  PSYCH: alert & oriented x 3, fluent speech NEURO: no focal motor/sensory deficits  LABORATORY DATA:  I have reviewed the data as listed    Latest Ref Rng & Units 01/22/2022    3:34 PM 12/23/2021    3:12 PM 11/15/2021   10:49 AM  CBC  WBC 4.0 - 10.5 K/uL 3.2  3.0  2.7   Hemoglobin 12.0 - 15.0 g/dL 12.4  12.4  12.0   Hematocrit 36.0 - 46.0 % 36.2  37.1  35.4   Platelets 150 - 400 K/uL 161  184  156        Latest Ref Rng & Units 01/22/2022    3:34 PM 10/02/2021   11:12 AM 04/03/2021   10:18 AM  CMP  Glucose 70 - 99 mg/dL 86  87  66   BUN 8 - 23  mg/dL '12  10  12   '$ Creatinine 0.44 - 1.00 mg/dL 0.83  0.75  0.88   Sodium 135 - 145 mmol/L 137  140  140   Potassium 3.5 - 5.1 mmol/L 4.1  4.6  4.5   Chloride 98 - 111 mmol/L 106  104  105   CO2 22 - 32 mmol/L '26  28  29   '$ Calcium 8.9 - 10.3 mg/dL 9.6  9.7  9.6   Total Protein 6.5 - 8.1 g/dL 7.1  6.6  6.9   Total Bilirubin 0.3 - 1.2 mg/dL 0.6  0.5  0.4   Alkaline Phos 38 - 126 U/L 64     AST 15 - 41 U/L '23  19  17   '$ ALT 0 - 44 U/L '17  16  12    '$ ASSESSMENT & PLAN Arna Luis Sorci is a 71 y.o. female who presents to the hematology clinic for initial evaluation for neutropenia.   The differentials for neutropenia include benign ethnic neutropenia, infectious etiology, nutritional etiology, inflammatory etiology, or medication induced.  The patient is not taking any medications known to cause neutropenia.    Based on record review, there is evidence of long standing neutropenia for over 10 years. Recent labs from PCP on 10/14/2021 showed not evidence of nutritional deficiences, HIV, Hepatitis B or C, thyroid dysfunction or inflammatory process. Additionally, prior CT imaging from 04/12/2018 shows no evidence of liver disease.   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  This is a common condition whereby individuals of African or Mediterranean descent tend to have slightly lower white blood cell counts in the general population.  Hallmark of the syndrome is an [place] between 1000 and 1500 which this patient falls into.  This is of little to no clinical consequence.  This is a diagnosis of exclusion therefore we will do further work-up in order to ensure there is no other etiology.   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  --Assuming there are no concerning findings on the blood work-up today, no further diagnostic testing is required and patient can follow up with PCP moving forward.    Orders Placed This Encounter  Procedures   CBC with Differential ([facility] Only)    Standing Status:   Future    Number of Occurrences:   1    Standing Expiration Date:   01/23/2023   CMP ([facility] only)    Standing Status:   Future    Number of Occurrences:   1    Standing Expiration Date:   01/23/2023   Save Smear for Provider Slide Review    Standing Status:   Future    Number of Occurrences:   1    Standing Expiration Date:   01/23/2023   Vitamin B12    Standing Status:   Future    Number of Occurrences:   1    Standing Expiration Date:   01/22/2023   Methylmalonic acid, serum    Standing Status:   Future    Number of Occurrences:   1    Standing Expiration Date:   01/22/2023   Hepatitis B core antibody, total    Standing Status:   Future    Number of Occurrences:   1    Standing Expiration Date:   01/22/2023   Hepatitis B surface antibody    Standing Status:   Future    Number of Occurrences:   1    Standing Expiration Date:   01/22/2023    All questions were answered. The patient knows to call the clinic with any problems, questions or concerns.  I have spent a total of 60 minutes minutes of face-to-face and non-face-to-face time, preparing to see the patient, obtaining and/or reviewing separately obtained history, performing a medically appropriate examination, counseling and educating the patient, ordering tests/procedures,  documenting clinical information in the electronic health record, and care coordination.   Dede Query, PA-C Department of Hematology/Oncology [place] at [facility] Phone: [phone]  Patient was seen with Dr. Lorenso Courier  I have read the above note and personally examined  the patient. I agree with the assessment and plan as noted above.  Briefly Mrs. Kreh is a 71 year old female who presents for evaluation of longstanding neutropenia.  Her [place] appears to range from 1000-1500.  It has been chronically ongoing since at least 2014.  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  The patient notes that she is not prone to recurrent infections and has not recently been on any antibiotic therapy.  Overall her findings are reassuring for chronic stable benign disease.  We will see her back pending the results of the above studies.   Ledell Peoples, MD Department of Hematology/Oncology [place] at [facility] Phone: [phone] Pager:  [phone] Email: [email or web address]

## 2022-01-25 ENCOUNTER — Other Ambulatory Visit: Payer: Self-pay | Admitting: Internal Medicine

## 2022-01-26 LAB — METHYLMALONIC ACID, SERUM: Methylmalonic Acid, Quantitative: 95 nmol/L (ref 0–378)

## 2022-01-27 ENCOUNTER — Telehealth: Payer: Self-pay | Admitting: *Deleted

## 2022-01-27 NOTE — Telephone Encounter (Signed)
TCT patient regarding recent lab results No answer. Left vm message for pt to return at her convenience @ [phone].

## 2022-01-27 NOTE — Telephone Encounter (Signed)
-----   Message from Lincoln Brigham, PA-C sent at 01/27/2022 10:42 AM EDT ----- Please notify patient that neutrophil counts are stable and rest of the workup was negative. Findings are consistent with benign neutropenia so no further workup is required. Okay to discharge to PCP and follow up in our clinic if blood counts worsen .

## 2022-01-29 ENCOUNTER — Telehealth: Payer: Self-pay

## 2022-01-29 NOTE — Telephone Encounter (Signed)
Returned pt's call. Left HIPPA compliant VM with message below from Dr. Lorenso Courier:  Please notify patient that neutrophil counts are stable and rest of the workup was negative. Findings are consistent with benign neutropenia so no further workup is required. Okay to discharge to PCP and follow up in our clinic if blood counts worsen  Encouraged pt to call clinic with questions or concerns.

## 2022-01-29 NOTE — Telephone Encounter (Signed)
-----   Message from Otila Kluver, RN sent at 01/27/2022  3:50 PM EDT -----  ----- Message ----- From: Cordelia Poche Sent: 01/27/2022  10:42 AM EDT To: Otila Kluver, RN  Please notify patient that neutrophil counts are stable and rest of the workup was negative. Findings are consistent with benign neutropenia so no further workup is required. Okay to discharge to PCP and follow up in our clinic if blood counts worsen .

## 2022-01-29 NOTE — Telephone Encounter (Signed)
Pt advised with VU 

## 2022-02-18 ENCOUNTER — Telehealth: Payer: Self-pay | Admitting: Physician Assistant

## 2022-02-18 NOTE — Telephone Encounter (Signed)
Scheduled per 9/5 in basket, message has been left

## 2022-02-24 ENCOUNTER — Other Ambulatory Visit: Payer: Self-pay | Admitting: Physician Assistant

## 2022-02-24 DIAGNOSIS — D708 Other neutropenia: Secondary | ICD-10-CM

## 2022-02-25 ENCOUNTER — Other Ambulatory Visit: Payer: Self-pay

## 2022-02-25 ENCOUNTER — Inpatient Hospital Stay: Payer: Medicare Other | Attending: Physician Assistant

## 2022-02-25 ENCOUNTER — Inpatient Hospital Stay (HOSPITAL_BASED_OUTPATIENT_CLINIC_OR_DEPARTMENT_OTHER): Payer: Medicare Other | Admitting: Physician Assistant

## 2022-02-25 VITALS — BP 104/65 | HR 56 | Temp 97.7°F | Resp 14 | Wt 141.1 lb

## 2022-02-25 DIAGNOSIS — E785 Hyperlipidemia, unspecified: Secondary | ICD-10-CM | POA: Insufficient documentation

## 2022-02-25 DIAGNOSIS — E559 Vitamin D deficiency, unspecified: Secondary | ICD-10-CM | POA: Insufficient documentation

## 2022-02-25 DIAGNOSIS — Z79899 Other long term (current) drug therapy: Secondary | ICD-10-CM | POA: Insufficient documentation

## 2022-02-25 DIAGNOSIS — K5900 Constipation, unspecified: Secondary | ICD-10-CM | POA: Diagnosis not present

## 2022-02-25 DIAGNOSIS — K219 Gastro-esophageal reflux disease without esophagitis: Secondary | ICD-10-CM | POA: Insufficient documentation

## 2022-02-25 DIAGNOSIS — D709 Neutropenia, unspecified: Secondary | ICD-10-CM | POA: Insufficient documentation

## 2022-02-25 DIAGNOSIS — M129 Arthropathy, unspecified: Secondary | ICD-10-CM | POA: Diagnosis not present

## 2022-02-25 DIAGNOSIS — K589 Irritable bowel syndrome without diarrhea: Secondary | ICD-10-CM | POA: Insufficient documentation

## 2022-02-25 DIAGNOSIS — M810 Age-related osteoporosis without current pathological fracture: Secondary | ICD-10-CM | POA: Insufficient documentation

## 2022-02-25 DIAGNOSIS — D708 Other neutropenia: Secondary | ICD-10-CM

## 2022-02-25 DIAGNOSIS — Z801 Family history of malignant neoplasm of trachea, bronchus and lung: Secondary | ICD-10-CM | POA: Diagnosis not present

## 2022-02-25 DIAGNOSIS — Z806 Family history of leukemia: Secondary | ICD-10-CM | POA: Insufficient documentation

## 2022-02-25 DIAGNOSIS — Z7982 Long term (current) use of aspirin: Secondary | ICD-10-CM | POA: Insufficient documentation

## 2022-02-25 LAB — CMP (CANCER CENTER ONLY)
ALT: 13 U/L (ref 0–44)
AST: 18 U/L (ref 15–41)
Albumin: 4.1 g/dL (ref 3.5–5.0)
Alkaline Phosphatase: 71 U/L (ref 38–126)
Anion gap: 3 — ABNORMAL LOW (ref 5–15)
BUN: 16 mg/dL (ref 8–23)
CO2: 29 mmol/L (ref 22–32)
Calcium: 9.8 mg/dL (ref 8.9–10.3)
Chloride: 105 mmol/L (ref 98–111)
Creatinine: 0.85 mg/dL (ref 0.44–1.00)
GFR, Estimated: >60 mL/min (ref >60)
Glucose, Bld: 106 mg/dL — ABNORMAL HIGH (ref 70–99)
Potassium: 4.2 mmol/L (ref 3.5–5.1)
Sodium: 137 mmol/L (ref 135–145)
Total Bilirubin: 0.3 mg/dL (ref 0.3–1.2)
Total Protein: 7.2 g/dL (ref 6.5–8.1)

## 2022-02-25 LAB — CBC WITH DIFFERENTIAL (CANCER CENTER ONLY)
Abs Immature Granulocytes: 0.01 10*3/uL (ref 0.00–0.07)
Basophils Absolute: 0 10*3/uL (ref 0.0–0.1)
Basophils Relative: 0 %
Eosinophils Absolute: 0 10*3/uL (ref 0.0–0.5)
Eosinophils Relative: 1 %
HCT: 37.5 % (ref 36.0–46.0)
Hemoglobin: 12.6 g/dL (ref 12.0–15.0)
Immature Granulocytes: 0 %
Lymphocytes Relative: 46 %
Lymphs Abs: 1.6 10*3/uL (ref 0.7–4.0)
MCH: 28.8 pg (ref 26.0–34.0)
MCHC: 33.6 g/dL (ref 30.0–36.0)
MCV: 85.8 fL (ref 80.0–100.0)
Monocytes Absolute: 0.3 10*3/uL (ref 0.1–1.0)
Monocytes Relative: 8 %
Neutro Abs: 1.6 10*3/uL — ABNORMAL LOW (ref 1.7–7.7)
Neutrophils Relative %: 45 %
Platelet Count: 169 10*3/uL (ref 150–400)
RBC: 4.37 MIL/uL (ref 3.87–5.11)
RDW: 13 % (ref 11.5–15.5)
WBC Count: 3.5 10*3/uL — ABNORMAL LOW (ref 4.0–10.5)
nRBC: 0 % (ref 0.0–0.2)

## 2022-02-25 LAB — HM MAMMOGRAPHY

## 2022-02-25 NOTE — Progress Notes (Signed)
[place] Telephone:([phone]   [phone]  PROGRESS NOTE  Patient Care Team: Unk Pinto, MD as PCP - General (Internal Medicine) Juanita Craver, MD as Consulting Physician (Gastroenterology) Monna Fam, MD as Consulting Physician (Ophthalmology) Charolette Forward, MD as Consulting Physician (Cardiology)  CHIEF COMPLAINTS/PURPOSE OF CONSULTATION:  Neutropenia  HISTORY OF PRESENTING ILLNESS:  [name] 71 y.o. female returns for a follow-up for neutropenia he was last seen on 01/22/2022 to establish care.  In the interim she has completed work-up and presents today to review the results.  On exam today, [name] continues to do well without any significant limitations.  Her energy is stable and she continues to complete all her daily activities on her own.  She denies any appetite changes or weight loss.  She denies nausea, vomiting or abdominal pain.  She has occasional episodes of constipation secondary to IBS.  She denies recurrent infections including sinusitis, pneumonia or UTIs.  She has no other complaints.  Rest of the 10 point ROS is below.  MEDICAL HISTORY:  Past Medical History:  Diagnosis Date   Arthritis    cervical and lumbar   Cataract    Mixed OU   Costochondritis 08/27/2020   Depression    GERD (gastroesophageal reflux disease)    Hyperlipidemia    IBS (irritable bowel syndrome)    Osteoporosis    Prediabetes    Vitamin D deficiency     SURGICAL HISTORY: Past Surgical History:  Procedure Laterality Date   CARDIAC CATHETERIZATION  2011   normal (Dr. Terrence Dupont)   RADIOACTIVE SEED GUIDED EXCISIONAL BREAST BIOPSY Right 03/11/2016   Procedure: RIGHT RADIOACTIVE SEED GUIDED EXCISIONAL BREAST BIOPSY;  Surgeon: Rolm Bookbinder, MD;  Location: [place];  Service: General;  Laterality: Right;    SOCIAL HISTORY: Social History   Socioeconomic History   Marital status: Married    Spouse name: Not on file    Number of children: 0   Years of education: Not on file   Highest education level: Not on file  Occupational History   Occupation: retired Copywriter, advertising  Tobacco Use   Smoking status: Never    Passive exposure: Never   Smokeless tobacco: Never  Vaping Use   Vaping Use: Never used  Substance and Sexual Activity   Alcohol use: Not Currently   Drug use: No   Sexual activity: Yes    Birth control/protection: Post-menopausal    Comment: 1st intercourse 85 yo-5 partners  Other Topics Concern   Not on file  Social History Narrative   Lives at home with spouse   Right handed   Caffeine: not much, drinks decaf 2 cups/day   Social Determinants of Health   Financial Resource Strain: Not on file  Food Insecurity: Not on file  Transportation Needs: Not on file  Physical Activity: Not on file  Stress: Not on file  Social Connections: Not on file  Intimate Partner Violence: Not on file    FAMILY HISTORY: Family History  Problem Relation Age of Onset   Hypertension Mother    Other Mother        surgeries involving upper back   Diabetes Father    Heart failure Father    Cancer Maternal Grandmother        Lung   Leukemia Maternal Aunt    Allergic rhinitis Neg Hx    Angioedema Neg Hx    Asthma Neg Hx    Atopy Neg Hx  Eczema Neg Hx    Immunodeficiency Neg Hx    Urticaria Neg Hx    Migraines Neg Hx     ALLERGIES:  is allergic to bactrim [sulfamethoxazole-trimethoprim], cephalexin, and sulfa antibiotics.  MEDICATIONS:  Current Outpatient Medications  Medication Sig Dispense Refill   aspirin 81 MG tablet Take 81 mg by mouth daily.     Bacillus Coagulans-Inulin (ALIGN PREBIOTIC-PROBIOTIC PO) Take 1 tablet by mouth daily.     Calcium Carbonate-Vit D-Min (CALCIUM 1200 PO) Take by mouth.     Cholecalciferol 125 MCG (5000 UT) capsule Take 5,000 Units by mouth daily.     ezetimibe (ZETIA) 10 MG tablet Take 1 tablet Dail for Cholesterol 90 tablet 3   Fluticasone  Propionate (FLONASE ALLERGY RELIEF NA) Place 2 sprays into the nose as needed.     Hyoscyamine Sulfate (HYOSCYAMINE PO) Take by mouth as needed.     loratadine (CLARITIN) 10 MG tablet Take 1 tablet (10 mg total) by mouth 2 (two) times daily as needed for allergies (Can take an extra dose during flare ups.). Take 1 tablet 1-2 times daily 60 tablet 11   Lutein 20 MG TABS Take by mouth.     Magnesium 250 MG TABS      Multiple Vitamins-Minerals (AIRBORNE) CHEW Chew 1 tablet by mouth as needed.     nitroGLYCERIN (NITROSTAT) 0.4 MG SL tablet Place 0.4 mg under the tongue every 5 (five) minutes as needed for chest pain.     pantoprazole (PROTONIX) 40 MG tablet Take 1 tablet (40 mg total) by mouth 2 (two) times daily. 60 tablet 11   Polyethylene Glycol 3350 (MIRALAX PO) Take by mouth at bedtime.     RESTASIS 0.05 % ophthalmic emulsion instill 1 drop into both eyes twice a day  0   triamcinolone (NASACORT ALLERGY 24HR) 55 MCG/ACT AERO nasal inhaler Place 2 sprays into the nose 2 (two) times daily. 1-2 sprays per nostril 1-7 times per week. 16.9 g 11   vitamin B-12 (CYANOCOBALAMIN) 1000 MCG tablet Take 1,000 mcg by mouth 3 (three) times a week.     No current facility-administered medications for this visit.    REVIEW OF SYSTEMS:   Constitutional: ( - ) fevers, ( - )  chills , ( - ) night sweats Eyes: ( - ) blurriness of vision, ( - ) double vision, ( - ) watery eyes Ears, nose, mouth, throat, and face: ( - ) mucositis, ( - ) sore throat Respiratory: ( - ) cough, ( - ) dyspnea, ( - ) wheezes Cardiovascular: ( - ) palpitation, ( - ) chest discomfort, ( - ) lower extremity swelling Gastrointestinal:  ( - ) nausea, ( - ) heartburn, ( - ) change in bowel habits Skin: ( - ) abnormal skin rashes Lymphatics: ( - ) new lymphadenopathy, ( - ) easy bruising Neurological: ( - ) numbness, ( - ) tingling, ( - ) new weaknesses Behavioral/Psych: ( - ) mood change, ( - ) new changes  All other systems were reviewed  with the patient and are negative.  PHYSICAL EXAMINATION: ECOG PERFORMANCE STATUS: 0 - Asymptomatic  Vitals:   02/25/22 1530  BP: 104/65  Pulse: (!) 56  Resp: 14  Temp: 97.7 F (36.5 C)  SpO2: 100%   Filed Weights   02/25/22 1530  Weight: 141 lb 1.6 oz (64 kg)    GENERAL: well appearing female in NAD  SKIN: skin color, texture, turgor are normal, no rashes or significant lesions EYES: conjunctiva  are pink and non-injected, sclera clear LUNGS: clear to auscultation and percussion with normal breathing effort HEART: regular rate & rhythm and no murmurs and no lower extremity edema Musculoskeletal: no cyanosis of digits and no clubbing  PSYCH: alert & oriented x 3, fluent speech NEURO: no focal motor/sensory deficits  LABORATORY DATA:  I have reviewed the data as listed    Latest Ref Rng & Units 02/25/2022    3:10 PM 01/22/2022    3:34 PM 12/23/2021    3:12 PM  CBC  WBC 4.0 - 10.5 K/uL 3.5  3.2  3.0   Hemoglobin 12.0 - 15.0 g/dL 12.6  12.4  12.4   Hematocrit 36.0 - 46.0 % 37.5  36.2  37.1   Platelets 150 - 400 K/uL 169  161  184        Latest Ref Rng & Units 02/25/2022    3:10 PM 01/22/2022    3:34 PM 10/02/2021   11:12 AM  CMP  Glucose 70 - 99 mg/dL 106  86  87   BUN 8 - 23 mg/dL '16  12  10   '$ Creatinine 0.44 - 1.00 mg/dL 0.85  0.83  0.75   Sodium 135 - 145 mmol/L 137  137  140   Potassium 3.5 - 5.1 mmol/L 4.2  4.1  4.6   Chloride 98 - 111 mmol/L 105  106  104   CO2 22 - 32 mmol/L '29  26  28   '$ Calcium 8.9 - 10.3 mg/dL 9.8  9.6  9.7   Total Protein 6.5 - 8.1 g/dL 7.2  7.1  6.6   Total Bilirubin 0.3 - 1.2 mg/dL 0.3  0.6  0.5   Alkaline Phos 38 - 126 U/L 71  64    AST 15 - 41 U/L '18  23  19   '$ ALT 0 - 44 U/L '13  17  16    '$ ASSESSMENT & PLAN [name] is a 71 y.o. female who presents for follow-up for neutropenia.  # Leukopenia/Neutropenia  --Labs from 10/14/2021 and 01/22/2022 ruled out nutritional deficiencies, inflammatory process, Hepatitis B/C and HIV --Labs  today show improvement of neutropenia. ANC was 1.2 on 01/22/2022 and today it is 1.6. Rest of the labs was unremarkable.  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  --No further diagnostic testing is required and patient can follow up with PCP moving forward.   No orders of the defined types were placed in this encounter.   All questions were answered. The patient knows to call the clinic with any problems, questions or concerns.  I have spent a total of 20 minutes minutes of face-to-face and non-face-to-face time, preparing to see the patient,  performing a medically appropriate examination, counseling and educating the patient,  documenting clinical information in the electronic health record, and care coordination.   Dede Query, PA-C Department of Hematology/Oncology [place] at [facility] Phone: [phone]

## 2022-03-05 LAB — HM DEXA SCAN

## 2022-03-06 ENCOUNTER — Encounter: Payer: Self-pay | Admitting: Internal Medicine

## 2022-03-09 NOTE — Progress Notes (Signed)
Assessment and Plan: [name] was seen today for groin pain and leg pain.  Diagnoses and all orders for this visit:  Irritable bowel syndrome with constipation Continue meds and follow with GI  Lower abdominal pain/Hip pain Will get abdominal U/S to evaluate If U/S is negative will plan to xray hips If pain becomes severe, develop fever greater than 101, nausea or vomiting go to the ER -     US Abdomen Complete; Future       Further disposition pending results of labs. Discussed med's effects and SE's.   Over 30 minutes of exam, counseling, chart review, and critical decision making was performed.   Future Appointments  Date Time Provider [place]  04/03/2022 10:00 AM Jodi Pinto, MD [facility] None  07/23/2022  9:40 AM Jodi Kingdom, MD [facility] None  10/03/2022  9:00 AM Jodi Rossetti, NP [facility] None    ------------------------------------------------------------------------------------------------------------------   HPI BP (!) 102/58   Pulse 73   Temp (!) 97.5 F (36.4 C)   Ht '5\' 4"'$  (1.626 m)   Wt 143 lb 9.6 oz (65.1 kg)   SpO2 98%   BMI 24.65 kg/m    71 y.o.female presents for Pain on both sides of lower abdomen that radiates into groin and down thighs.  The pain has been intermittently occurring x 6 months but in the past week has been occurring daily. She does have a history of irritable bowel syndrome for which she takes Hyoscyamine.  She took the medication this morning to see if it would help the pelvic pain but it did not. She has used Advil and it did help but as soon as medication started to wear off the pain returned. She has been evaluated at GYN and pelvic U/S was normal.   Bowel movements are fairly regular, will get constipation but is controlled with Miralax.   BP Readings from Last 3 Encounters:  03/10/22 (!) 102/58  02/25/22 104/65  01/22/22 100/63  BP is currently well controlled without medications.  Denies headaches,  chest pain, shortness of breath and dizziness.     Past Medical History:  Diagnosis Date   Arthritis    cervical and lumbar   Cataract    Mixed OU   Costochondritis 08/27/2020   Depression    GERD (gastroesophageal reflux disease)    Hyperlipidemia    IBS (irritable bowel syndrome)    Osteoporosis    Prediabetes    Vitamin D deficiency      Allergies  Allergen Reactions   Bactrim [Sulfamethoxazole-Trimethoprim] Other (See Comments)    Patient preference to take medication without sulfa   Cephalexin Nausea And Vomiting    Rash; chest pain Rash; chest pain   Sulfa Antibiotics     Current Outpatient Medications on File Prior to Visit  Medication Sig   aspirin 81 MG tablet Take 81 mg by mouth daily.   Bacillus Coagulans-Inulin (ALIGN PREBIOTIC-PROBIOTIC PO) Take 1 tablet by mouth daily.   Cholecalciferol 125 MCG (5000 UT) capsule Take 5,000 Units by mouth daily. Taking 4,000 units   ezetimibe (ZETIA) 10 MG tablet Take 1 tablet Dail for Cholesterol   Fluticasone Propionate (FLONASE ALLERGY RELIEF NA) Place 2 sprays into the nose as needed.   Hyoscyamine Sulfate (HYOSCYAMINE PO) Take by mouth as needed.   loratadine (CLARITIN) 10 MG tablet Take 1 tablet (10 mg total) by mouth 2 (two) times daily as needed for allergies (Can take an extra dose during flare ups.). Take 1 tablet 1-2 times daily  Lutein 20 MG TABS Take by mouth.   Magnesium 250 MG TABS    Multiple Vitamin (MULTIVITAMIN) tablet Take 1 tablet by mouth daily.   nitroGLYCERIN (NITROSTAT) 0.4 MG SL tablet Place 0.4 mg under the tongue every 5 (five) minutes as needed for chest pain.   pantoprazole (PROTONIX) 40 MG tablet Take 1 tablet (40 mg total) by mouth 2 (two) times daily.   Polyethylene Glycol 3350 (MIRALAX PO) Take by mouth at bedtime.   RESTASIS 0.05 % ophthalmic emulsion instill 1 drop into both eyes twice a day   triamcinolone (NASACORT ALLERGY 24HR) 55 MCG/ACT AERO nasal inhaler Place 2 sprays into the nose  2 (two) times daily. 1-2 sprays per nostril 1-7 times per week.   vitamin B-12 (CYANOCOBALAMIN) 1000 MCG tablet Take 1,000 mcg by mouth 3 (three) times a week.   Calcium Carbonate-Vit D-Min (CALCIUM 1200 PO) Take by mouth. (Patient not taking: Reported on 03/10/2022)   Multiple Vitamins-Minerals (AIRBORNE) CHEW Chew 1 tablet by mouth as needed. (Patient not taking: Reported on 03/10/2022)   No current facility-administered medications on file prior to visit.    ROS: all negative except above.   Physical Exam:  BP (!) 102/58   Pulse 73   Temp (!) 97.5 F (36.4 C)   Ht '5\' 4"'$  (1.626 m)   Wt 143 lb 9.6 oz (65.1 kg)   SpO2 98%   BMI 24.65 kg/m   General Appearance: Well nourished, in no apparent distress. Eyes: PERRLA, EOMs, conjunctiva no swelling or erythema Sinuses: No Frontal/maxillary tenderness ENT/Mouth: Ext aud canals clear, TMs without erythema, bulging. No erythema, swelling, or exudate on post pharynx.  Tonsils not swollen or erythematous. Hearing normal.  Neck: Supple, thyroid normal.  Respiratory: Respiratory effort normal, BS equal bilaterally without rales, rhonchi, wheezing or stridor.  Cardio: RRR with no MRGs. Brisk peripheral pulses without edema.  Abdomen: Soft, + BS.  Mild tenderness of lower abdomen but does not reproduce the pain that goes to her legs Lymphatics: Non tender without lymphadenopathy.  Musculoskeletal: Full ROM, 5/5 strength, normal gait.  Skin: Warm, dry without rashes, lesions, ecchymosis.  Neuro: Cranial nerves intact. Normal muscle tone, no cerebellar symptoms. Sensation intact.  Psych: Awake and oriented X 3, normal affect, Insight and Judgment appropriate.     Jodi Rossetti, NP 11:50 AM [facility]

## 2022-03-10 ENCOUNTER — Encounter: Payer: Self-pay | Admitting: Nurse Practitioner

## 2022-03-10 ENCOUNTER — Ambulatory Visit: Payer: Medicare Other | Admitting: Nurse Practitioner

## 2022-03-10 VITALS — BP 102/58 | HR 73 | Temp 97.5°F | Ht 64.0 in | Wt 143.6 lb

## 2022-03-10 DIAGNOSIS — R1030 Lower abdominal pain, unspecified: Secondary | ICD-10-CM

## 2022-03-10 DIAGNOSIS — M25551 Pain in right hip: Secondary | ICD-10-CM | POA: Diagnosis not present

## 2022-03-10 DIAGNOSIS — K581 Irritable bowel syndrome with constipation: Secondary | ICD-10-CM | POA: Diagnosis not present

## 2022-03-10 DIAGNOSIS — M25552 Pain in left hip: Secondary | ICD-10-CM | POA: Diagnosis not present

## 2022-03-10 NOTE — Patient Instructions (Signed)
YOU CAN CALL TO MAKE AN ULTRASOUND..  I have put in an order for an ultrasound for you to have You can set them up at your convenience by calling this number [phone] You will likely have the ultrasound at [facility]  If you have any issues call our office and we will set this up for you.     Abdominal Pain, Adult Pain in the abdomen (abdominal pain) can be caused by many things. Often, abdominal pain is not serious and it gets better with no treatment or by being treated at home. However, sometimes abdominal pain is serious. Your health care provider will ask questions about your medical history and do a physical exam to try to determine the cause of your abdominal pain. Follow these instructions at home: Medicines Take over-the-counter and prescription medicines only as told by your health care provider. Do not take a laxative unless told by your health care provider. General instructions  Watch your condition for any changes. Drink enough fluid to keep your urine pale yellow. Keep all follow-up visits as told by your health care provider. This is important. Contact a health care provider if: Your abdominal pain changes or gets worse. You are not hungry or you lose weight without trying. You are constipated or have diarrhea for more than 2-3 days. You have pain when you urinate or have a bowel movement. Your abdominal pain wakes you up at night. Your pain gets worse with meals, after eating, or with certain foods. You are vomiting and cannot keep anything down. You have a fever. You have blood in your urine. Get help right away if: Your pain does not go away as soon as your health care provider told you to expect. You cannot stop vomiting. Your pain is only in areas of the abdomen, such as the right side or the left lower portion of the abdomen. Pain on the right side could be caused by appendicitis. You have bloody or black stools, or stools that look like  tar. You have severe pain, cramping, or bloating in your abdomen. You have signs of dehydration, such as: Dark urine, very little urine, or no urine. Cracked lips. Dry mouth. Sunken eyes. Sleepiness. Weakness. You have trouble breathing or chest pain. Summary Often, abdominal pain is not serious and it gets better with no treatment or by being treated at home. However, sometimes abdominal pain is serious. Watch your condition for any changes. Take over-the-counter and prescription medicines only as told by your health care provider. Contact a health care provider if your abdominal pain changes or gets worse. Get help right away if you have severe pain, cramping, or bloating in your abdomen. This information is not intended to replace advice given to you by your health care provider. Make sure you discuss any questions you have with your health care provider. Document Revised: 07/22/2019 Document Reviewed: 10/11/2018 Elsevier Patient Education  [place].

## 2022-03-12 ENCOUNTER — Encounter: Payer: Self-pay | Admitting: Internal Medicine

## 2022-03-12 ENCOUNTER — Ambulatory Visit
Admission: RE | Admit: 2022-03-12 | Discharge: 2022-03-12 | Disposition: A | Discharge status: Home / Self Care | Payer: Medicare Other | Source: Ambulatory Visit | Attending: Nurse Practitioner | Admitting: Nurse Practitioner

## 2022-03-12 DIAGNOSIS — R1030 Lower abdominal pain, unspecified: Secondary | ICD-10-CM

## 2022-03-17 ENCOUNTER — Encounter: Payer: Self-pay | Admitting: Nurse Practitioner

## 2022-03-19 ENCOUNTER — Encounter: Payer: Self-pay | Admitting: Obstetrics and Gynecology

## 2022-03-19 ENCOUNTER — Other Ambulatory Visit (HOSPITAL_COMMUNITY)
Admission: RE | Admit: 2022-03-19 | Discharge: 2022-03-19 | Disposition: A | Discharge status: Home / Self Care | Payer: Medicare Other | Source: Ambulatory Visit | Attending: Obstetrics and Gynecology | Admitting: Obstetrics and Gynecology

## 2022-03-19 ENCOUNTER — Ambulatory Visit: Payer: Medicare Other | Admitting: Obstetrics and Gynecology

## 2022-03-19 VITALS — BP 110/64 | HR 76 | Ht 63.5 in | Wt 145.0 lb

## 2022-03-19 DIAGNOSIS — D259 Leiomyoma of uterus, unspecified: Secondary | ICD-10-CM

## 2022-03-19 DIAGNOSIS — N859 Noninflammatory disorder of uterus, unspecified: Secondary | ICD-10-CM | POA: Insufficient documentation

## 2022-03-19 DIAGNOSIS — N930 Postcoital and contact bleeding: Secondary | ICD-10-CM | POA: Diagnosis present

## 2022-03-19 DIAGNOSIS — N882 Stricture and stenosis of cervix uteri: Secondary | ICD-10-CM

## 2022-03-19 NOTE — Patient Instructions (Signed)

## 2022-03-19 NOTE — Progress Notes (Signed)
GYNECOLOGY  VISIT   HPI: 71 y.o.   Married Black or [place] American Not Hispanic or Latino  female   [id number] with No LMP recorded. Patient is postmenopausal.   here to discuss her fibroids. Patient states that her PCP advised her to follow up.  She has a known h/o fibroids, seen all the way back in 2016, no significant change. Her primary got the u/s to evaluate abdominal pain. The pain has been present since 1/23, her fibroids are small and have not changed.   No baseline spotting. She noticed a slight pink d/c after intercourse a couple of months ago. Only notices it when intercourse hurts. The pain is deep inside, not on entry.   Her recent ultrasound she had several small stable fibroids, she also had some endometrial fluid which created a natural sonohysterogram. Her stripe measured 1.4 mm.   GYNECOLOGIC HISTORY: No LMP recorded. Patient is postmenopausal. 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   Upper airway cough syndrome 05/04/2019   Pain in right knee 12/27/2018   Bigeminy 03/25/2018   Osteoporosis 08/11/2017   Abnormal glucose 06/25/2017   Medication management 12/12/2014   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 Terrence Dupont)   RADIOACTIVE SEED GUIDED EXCISIONAL BREAST BIOPSY Right 03/11/2016   Procedure: RIGHT RADIOACTIVE SEED GUIDED EXCISIONAL BREAST BIOPSY;  Surgeon: Rolm Bookbinder, MD;  Location: [facility];  Service: General;  Laterality: Right;    Current Outpatient Medications  Medication Sig Dispense Refill   aspirin 81 MG tablet Take 81 mg by mouth daily.     Bacillus Coagulans-Inulin (ALIGN PREBIOTIC-PROBIOTIC PO) Take 1 tablet by mouth daily.     Cholecalciferol 125 MCG (5000 UT) capsule Take 5,000 Units by mouth daily. Taking 4,000 units     ezetimibe (ZETIA) 10 MG tablet Take 1 tablet Dail for Cholesterol 90 tablet 3   Fluticasone Propionate (FLONASE ALLERGY RELIEF NA) Place 2 sprays into the nose as needed.     hyoscyamine (LEVBID) 0.375 MG 12 hr tablet Take by mouth.     Hyoscyamine Sulfate (HYOSCYAMINE PO) Take by mouth as needed.     loratadine (CLARITIN) 10 MG  tablet Take 1 tablet (10 mg total) by mouth 2 (two) times daily as needed for allergies (Can take an extra dose during flare ups.). Take 1 tablet 1-2 times daily 60 tablet 11   Lutein 20 MG TABS Take by mouth.     Magnesium 250 MG TABS      Multiple Vitamin (MULTIVITAMIN) tablet Take 1 tablet by mouth daily.     nitroGLYCERIN (NITROSTAT) 0.4 MG SL tablet Place 0.4 mg under the tongue every 5 (five) minutes as needed for chest pain.     pantoprazole (PROTONIX) 40 MG tablet Take 1 tablet (40 mg total) by mouth 2 (two) times daily. 60 tablet 11   Polyethylene Glycol 3350 (MIRALAX PO) Take by mouth at bedtime.     RESTASIS 0.05 % ophthalmic emulsion instill 1 drop into both eyes twice a day  0   triamcinolone (NASACORT ALLERGY 24HR) 55 MCG/ACT AERO nasal inhaler Place 2 sprays into the nose 2 (two)  times daily. 1-2 sprays per nostril 1-7 times per week. 16.9 g 11   vitamin B-12 (CYANOCOBALAMIN) 1000 MCG tablet Take 1,000 mcg by mouth 3 (three) times a week.     No current facility-administered medications for this visit.     ALLERGIES: Bactrim [sulfamethoxazole-trimethoprim], Cephalexin, and Sulfa antibiotics  Family History  Problem Relation Age of Onset   Hypertension Mother    Other Mother        surgeries involving upper back   Diabetes Father    Heart failure Father    Cancer Maternal Grandmother        Lung   Leukemia Maternal Aunt    Allergic rhinitis Neg Hx    Angioedema Neg Hx    Asthma Neg Hx    Atopy Neg Hx    Eczema Neg Hx    Immunodeficiency Neg Hx    Urticaria Neg Hx    Migraines Neg Hx     Social History   Socioeconomic History   Marital status: Married    Spouse name: Not on file   Number of children: 0   Years of education: Not on file   Highest education level: Not on file  Occupational History   Occupation: retired Copywriter, advertising  Tobacco Use   Smoking status: Never    Passive exposure: Never   Smokeless tobacco: Never  Vaping Use   Vaping Use: Never used  Substance and Sexual Activity   Alcohol use: Not Currently   Drug use: No   Sexual activity: Yes    Birth control/protection: Post-menopausal    Comment: 1st intercourse 89 yo-5 partners  Other Topics Concern   Not on file  Social History Narrative   Lives at home with spouse   Right handed   Caffeine: not much, drinks decaf 2 cups/day   Social Determinants of Health   Financial Resource Strain: Not on file  Food Insecurity: Not on file  Transportation Needs: Not on file  Physical Activity: Not on file  Stress: Not on file  Social Connections: Not on file  Intimate Partner Violence: Not on file    Review of Systems  All other systems reviewed and are negative.   PHYSICAL EXAMINATION:    BP 110/64   Pulse 76   Ht 5' 3.5" (1.613 m)   Wt 145 lb (65.8 kg)    SpO2 100%   BMI 25.28 kg/m     General appearance: alert, cooperative and appears stated age  Pelvic: External genitalia:  no lesions  Urethra:  normal appearing urethra with no masses, tenderness or lesions              Bartholins and Skenes: normal                 Vagina: normal appearing vagina with normal color and discharge, no lesions              Cervix: no lesions and stenotic               The risks of endometrial biopsy were reviewed and a consent was obtained.  A speculum was placed in the vagina and the cervix was cleansed with betadine. A tenaculum was placed on the cervix and the cervix was dilated with the mini-dilators. The pipelle was placed into the endometrial cavity. The uterus sounded to 6 cm. The endometrial biopsy was performed, taking care to get a representative sample, sampling 360 degrees of the uterine cavity. Minimal tissue was obtained. The tenaculum and speculum were removed. There were no complications.    Chaperone was present for exam.  1. Postcoital bleeding - Surgical pathology( [place]/ [facility])  2. Fluid in endometrial cavity - Surgical pathology( [place]/ [facility])  3. Uterine leiomyoma, unspecified location Unchanged and small, should not be causing any pain in a PMP patient  4. Cervical stenosis (uterine cervix) Needed cervical dilation

## 2022-03-21 LAB — SURGICAL PATHOLOGY

## 2022-03-26 ENCOUNTER — Telehealth: Payer: Self-pay | Admitting: *Deleted

## 2022-03-26 NOTE — Telephone Encounter (Signed)
She is one week s/p a benign endometrial biopsy. I would expect that the spotting would stop soon. If she is still spotting in 1-2 weeks she should call back. We also discussed trying to see her when she has postcoital spotting.

## 2022-03-26 NOTE — Telephone Encounter (Signed)
Left message for patient to call.

## 2022-03-26 NOTE — Telephone Encounter (Signed)
Patient called and left message in triage voicemail with questions. I left message for patient to call.

## 2022-03-26 NOTE — Telephone Encounter (Signed)
Patient called back to follow up from visit on 03/19/22 for postcoital bleeding. Reports she is no longer bleeding on pad, but does noticed pink color and red blood (only a few times) when wiping, mainly pink color and light cramping. She wanted to know if this is normal? Please advise

## 2022-03-26 NOTE — Telephone Encounter (Signed)
Patient informed. 

## 2022-04-02 ENCOUNTER — Encounter: Payer: Self-pay | Admitting: Internal Medicine

## 2022-04-02 NOTE — Progress Notes (Signed)
Annual Screening/Preventative Visit & Comprehensive Evaluation &  Examination   Future Appointments  Date Time Provider Department  04/03/2022                     cpe 10:00 AM Unk Pinto, MD [facility]  07/23/2022  9:40 AM Philemon Kingdom, MD [facility]  10/03/2022                       wellness  9:00 AM Alycia Rossetti, NP [facility]  04/09/2023                      cpe 10:00 AM Unk Pinto, MD [facility]        This very nice 71 y.o. MBF  presents for a Screening /Preventative Visit & comprehensive evaluation and management of multiple medical co-morbidities.  Patient has been followed for HTN, HLD, Prediabetes  and Vitamin D Deficiency.  Patient is followed by Dr Earlean Shawl for IBS-C & GERD. She has been seen by Dr's Wert & Kozlow for upper airway cough syndrome and recommended omeprazole 40 mg in AM, famotidine 40 mg in PM.   Patient has hx/o Osteoporosis by dexaBMD (T-3.1 on 03/05/2022)  and declines taking meds.  Patient is also followed by Dr Cruzita Lederer for Osteoporosis.       Patient also c/o thickened deformed toenails & desires treatment .       Patient has been followed for labile HTN.   Patient's BP has been controlled at home .  She had a Nl  heart echo & Heart cath in 2011 (Dr Terrence Dupont).  Negative Myoview 08/29/2020 - Dr Terrence Dupont.  Patient denies any cardiac symptoms as chest pain, palpitations, shortness of breath or ankle swelling. Today's BP is  at goal - 100/60 .         Patient's hyperlipidemia is near controlled with diet and Ezetimibe and she is reticent to take statins.   Patient denies myalgias or other medication SE's. Last lipids were near goal :  Lab Results  Component Value Date   CHOL 199 10/02/2021   HDL 84 10/02/2021   LDLCALC 101 (H) 10/02/2021   TRIG 55 10/02/2021   CHOLHDL 2.4 10/02/2021         Patient has hx/o prediabetes predating (A1c 5.7% /2014 & 6.1% /2016) and patient denies reactive hypoglycemic symptoms, visual blurring, diabetic  polys or paresthesias. Last A1c was at  goal :  Lab Results  Component Value Date   HGBA1C 5.6 04/03/2021         Finally, patient has history of Vitamin D Deficiency and last Vitamin D was at goal :  Lab Results  Component Value Date   VD25OH 93.60 07/17/2021        Current Outpatient Medications on File Prior to Visit  Medication Sig   aspirin 81 MG tablet Take daily.   Bacillus Coagulans-Inulin (ALIGN PREBIOTIC-PROBIOTIC ) Take 1 tablet daily.   Calcium 1200)-Vit D-Min  Take daily   Vit D 5000 u Take 5,000 Units by mouth daily.   ezetimibe (ZETIA) 10 MG tablet Take   1 tablet   Daily for Cholesterol   famotidine  40 MG tablet TAKE 1 TABLET EVERY DAY    loratadine  10 MG tablet Take 1 tablet 1-2 x daily   Lutein 20 MG TABS Take daily   Multiple Vitamins-Minerals (AIRBORNE)  Chew 1 tablet  as needed.   pantoprazole 40 MG tablet Take 1  tablet  in the morning.   Polyethylene Glycol  Take  at bedtime.   RESTASIS 0.05 % ophth emulsion instill 1 drop into both eyes twice a day   NASACORT  nasal inhaler 1-2 sprays per nostril 1-7 times per week.     Allergies  Allergen Reactions   Bactrim [Sulfamethoxazole-Trimethoprim] Other (See Comments)    Patient preference to take medication without sulfa   Cephalexin Nausea And Vomiting    Rash; chest pain     Past Medical History:  Diagnosis Date   Arthritis    cervical and lumbar   Cataract    Mixed OU   Depression    GERD (gastroesophageal reflux disease)    Hyperlipidemia    IBS (irritable bowel syndrome)    Osteoporosis    Prediabetes    Vitamin D deficiency      Health Maintenance  Topic Date Due   Zoster Vaccines- Shingrix (1 of 2) Never done   INFLUENZA VACCINE  01/14/2021   COVID-19 Vaccine (5 - Booster for Pfizer series) 04/28/2021   MAMMOGRAM  03/08/2022   TETANUS/TDAP  03/28/2029   DEXA SCAN  Completed   Hepatitis C Screening  Completed   HPV VACCINES  Aged Out     Immunization History   Administered Date(s) Administered   Influenza, High Dose  06/25/2017   Influenz 03/24/2018, 03/29/2019, 03/05/2020   PFIZER Covid-19 Tri-Sucrose Vacc 12/26/2020   PFIZER SARS-COV-2 Vacc 07/08/2019, 07/29/2019, 03/20/2020   PPD Test 12/12/2014, 12/26/2015, 01/15/2017   Pneumococcal -13 11/10/2018   Td 05/18/2007   Zoster, Live 05/17/2006     Last Colon - 10/18/2018 - Normal - Dr Earlean Shawl - Recc 10 yr f/u - due May 2030   Last MGM - 02/19/2022   Past Surgical History:  Procedure Laterality Date   CARDIAC CATHETERIZATION  2011   normal (Dr. Terrence Dupont)   RADIOACTIVE SEED GUIDED EXCISIONAL BREAST BIOPSY Right 03/11/2016   Procedure: RIGHT RADIOACTIVE SEED GUIDED EXCISIONAL BREAST BIOPSY;  Surgeon: Rolm Bookbinder, MD;  Location: [facility];  Service: General;  Laterality: Right;     Family History  Problem Relation Age of Onset   Hypertension Mother    Other Mother        surgeries involving upper back   Diabetes Father    Heart failure Father    Cancer Maternal Grandmother        Lung   Allergic rhinitis Neg Hx    Angioedema Neg Hx    Asthma Neg Hx    Atopy Neg Hx    Eczema Neg Hx    Immunodeficiency Neg Hx    Urticaria Neg Hx      Social History   Tobacco Use   Smoking status: Never   Smokeless tobacco: Never  Vaping Use   Vaping Use: Never used  Substance Use Topics   Alcohol use: Not Currently   Drug use: No      ROS Constitutional: Denies fever, chills, weight loss/gain, headaches, insomnia,  night sweats, and change in appetite. Does c/o fatigue. Eyes: Denies redness, blurred vision, diplopia, discharge, itchy, watery eyes.  ENT: Denies discharge, congestion, post nasal drip, epistaxis, sore throat, earache, hearing loss, dental pain, Tinnitus, Vertigo, Sinus pain, snoring.  Cardio: Denies chest pain, palpitations, irregular heartbeat, syncope, dyspnea, diaphoresis, orthopnea, PND, claudication, edema Respiratory: denies cough, dyspnea,  DOE, pleurisy, hoarseness, laryngitis, wheezing.  Gastrointestinal: Denies dysphagia, heartburn, reflux, water brash, pain, cramps, nausea, vomiting, bloating, diarrhea, constipation, hematemesis, melena, hematochezia, jaundice, hemorrhoids Genitourinary:  Denies dysuria, frequency, urgency, nocturia, hesitancy, discharge, hematuria, flank pain Breast: Breast lumps, nipple discharge, bleeding.  Musculoskeletal: Denies arthralgia, myalgia, stiffness, Jt. Swelling, pain, limp, and strain/sprain. Denies falls. Skin: Denies puritis, rash, hives, warts, acne, eczema, changing in skin lesion, All # 10 toes with dystropic, very thicked & discolored toe nails.  Neuro: No weakness, tremor, incoordination, spasms, paresthesia, pain Psychiatric: Denies confusion, memory loss, sensory loss. Denies Depression. Endocrine: Denies change in weight, skin, hair change, nocturia, and paresthesia, diabetic polys, visual blurring, hyper / hypo glycemic episodes.  Heme/Lymph: No excessive bleeding, bruising, enlarged lymph nodes.  Physical Exam  BP 100/60   Pulse 65   Temp (!) 97.3 F (36.3 C)   Resp 16   Ht '5\' 5"'$  (1.651 m)   Wt 144 lb 3.2 oz (65.4 kg)   SpO2 95%   BMI 24.00 kg/m   General Appearance: Well nourished, well groomed and in no apparent distress.  Eyes: PERRLA, EOMs, conjunctiva no swelling or erythema, normal fundi and vessels. Sinuses: No frontal/maxillary tenderness ENT/Mouth: EACs patent / TMs  nl. Nares clear without erythema, swelling, mucoid exudates. Oral hygiene is good. No erythema, swelling, or exudate. Tongue normal, non-obstructing. Tonsils not swollen or erythematous. Hearing normal.  Neck: Supple, thyroid not palpable. No bruits, nodes or JVD. Respiratory: Respiratory effort normal.  BS equal and clear bilateral without rales, rhonci, wheezing or stridor. Cardio: Heart sounds are normal with regular rate and rhythm and no murmurs, rubs or gallops. Peripheral pulses are normal and  equal bilaterally without edema. No aortic or femoral bruits. Chest: symmetric with normal excursions and percussion. Breasts: Symmetric, without lumps, nipple discharge, retractions, or fibrocystic changes.  Abdomen: Flat, soft with bowel sounds active. Nontender, no guarding, rebound, hernias, masses, or organomegaly.  Lymphatics: Non tender without lymphadenopathy.  Genitourinary:  Musculoskeletal: Full ROM all peripheral extremities, joint stability, 5/5 strength, and normal gait. Skin: Warm and dry without rashes, lesions, cyanosis, clubbing or  ecchymosis.  Neuro: Cranial nerves intact, reflexes equal bilaterally. Normal muscle tone, no cerebellar symptoms. Sensation intact.  Pysch: Alert and oriented X 3, normal affect, Insight and Judgment appropriate.    Assessment and Plan  1. Annual Preventative Screening Examination   2. Labile hypertension  - EKG 12-Lead - Urinalysis, Routine w reflex microscopic - Microalbumin / creatinine urine ratio - CBC with Differential/Platelet - COMPLETE METABOLIC PANEL WITH GFR - Magnesium - TSH  3. Hyperlipidemia, mixed  - EKG 12-Lead - Lipid panel - TSH  4. Abnormal glucose  - EKG 12-Lead - Hemoglobin A1c - Insulin, random  5. Vitamin D deficiency  - VITAMIN D 25 Hydroxy \  6. Osteoporosis   - COMPLETE METABOLIC PANEL WITH GFR - TSH  7. Onychomycosis of toenail  - terbinafine (LAMISIL) 250 MG tablet;  Take 1 tablet Daily for Nail Fungus   Dispense: 90 tablet; Refill: 0  8. Gastroesophageal reflux disease  - CBC with Differential/Platelet  9. Irritable bowel syndrome with constipation   10. Screening for colorectal cancer  - POC Hemoccult Bld/Stl   11. Screening for heart disease  - EKG 12-Lead  12. FHx: heart disease  - EKG 12-Lead  13. Medication management  - Urinalysis, Routine w reflex microscopic - Microalbumin / creatinine urine ratio - CBC with Differential/Platelet - COMPLETE METABOLIC PANEL  WITH GFR - Magnesium - Lipid panel - TSH - Hemoglobin A1c - Insulin, random - VITAMIN D 25 Hydroxy           Patient was counseled in  prudent diet to achieve/maintain BMI less than 25 for weight control, BP monitoring, regular exercise and medications. Discussed med's effects and SE's. Screening labs and tests as requested with regular follow-up as recommended. Over 40 minutes of exam, counseling, chart review and high complex critical decision making was performed.   Kirtland Bouchard, MD

## 2022-04-02 NOTE — Patient Instructions (Signed)

## 2022-04-03 ENCOUNTER — Encounter: Payer: Self-pay | Admitting: Internal Medicine

## 2022-04-03 ENCOUNTER — Ambulatory Visit: Payer: Medicare Other | Admitting: Internal Medicine

## 2022-04-03 VITALS — BP 100/60 | HR 65 | Temp 97.3°F | Resp 16 | Ht 65.0 in | Wt 144.2 lb

## 2022-04-03 DIAGNOSIS — Z0001 Encounter for general adult medical examination with abnormal findings: Secondary | ICD-10-CM

## 2022-04-03 DIAGNOSIS — R7309 Other abnormal glucose: Secondary | ICD-10-CM

## 2022-04-03 DIAGNOSIS — R0989 Other specified symptoms and signs involving the circulatory and respiratory systems: Secondary | ICD-10-CM | POA: Diagnosis not present

## 2022-04-03 DIAGNOSIS — Z136 Encounter for screening for cardiovascular disorders: Secondary | ICD-10-CM

## 2022-04-03 DIAGNOSIS — E782 Mixed hyperlipidemia: Secondary | ICD-10-CM

## 2022-04-03 DIAGNOSIS — M810 Age-related osteoporosis without current pathological fracture: Secondary | ICD-10-CM

## 2022-04-03 DIAGNOSIS — Z79899 Other long term (current) drug therapy: Secondary | ICD-10-CM

## 2022-04-03 DIAGNOSIS — Z8249 Family history of ischemic heart disease and other diseases of the circulatory system: Secondary | ICD-10-CM

## 2022-04-03 DIAGNOSIS — K581 Irritable bowel syndrome with constipation: Secondary | ICD-10-CM

## 2022-04-03 DIAGNOSIS — B351 Tinea unguium: Secondary | ICD-10-CM

## 2022-04-03 DIAGNOSIS — Z1211 Encounter for screening for malignant neoplasm of colon: Secondary | ICD-10-CM

## 2022-04-03 DIAGNOSIS — K219 Gastro-esophageal reflux disease without esophagitis: Secondary | ICD-10-CM

## 2022-04-03 DIAGNOSIS — Z0000 Encounter for general adult medical examination without abnormal findings: Secondary | ICD-10-CM | POA: Diagnosis not present

## 2022-04-03 DIAGNOSIS — E559 Vitamin D deficiency, unspecified: Secondary | ICD-10-CM

## 2022-04-04 LAB — CBC WITH DIFFERENTIAL/PLATELET
Absolute Monocytes: 232 cells/uL (ref 200–950)
Basophils Absolute: 22 cells/uL (ref 0–200)
Basophils Relative: 0.8 %
Eosinophils Absolute: 62 cells/uL (ref 15–500)
Eosinophils Relative: 2.3 %
HCT: 35.5 % (ref 35.0–45.0)
Hemoglobin: 12 g/dL (ref 11.7–15.5)
Lymphs Abs: 1177 cells/uL (ref 850–3900)
MCH: 29.4 pg (ref 27.0–33.0)
MCHC: 33.8 g/dL (ref 32.0–36.0)
MCV: 87 fL (ref 80.0–100.0)
MPV: 11.6 fL (ref 7.5–12.5)
Monocytes Relative: 8.6 %
Neutro Abs: 1207 cells/uL — ABNORMAL LOW (ref 1500–7800)
Neutrophils Relative %: 44.7 %
Platelets: 165 10*3/uL (ref 140–400)
RBC: 4.08 10*6/uL (ref 3.80–5.10)
RDW: 13.1 % (ref 11.0–15.0)
Total Lymphocyte: 43.6 %
WBC: 2.7 10*3/uL — ABNORMAL LOW (ref 3.8–10.8)

## 2022-04-04 LAB — COMPLETE METABOLIC PANEL WITH GFR
AG Ratio: 1.6 (calc) (ref 1.0–2.5)
ALT: 14 U/L (ref 6–29)
AST: 17 U/L (ref 10–35)
Albumin: 4.1 g/dL (ref 3.6–5.1)
Alkaline phosphatase (APISO): 69 U/L (ref 37–153)
BUN: 13 mg/dL (ref 7–25)
CO2: 28 mmol/L (ref 20–32)
Calcium: 9.4 mg/dL (ref 8.6–10.4)
Chloride: 104 mmol/L (ref 98–110)
Creat: 0.8 mg/dL (ref 0.50–1.05)
Globulin: 2.6 g/dL (calc) (ref 1.9–3.7)
Glucose, Bld: 83 mg/dL (ref 65–99)
Potassium: 4.6 mmol/L (ref 3.5–5.3)
Sodium: 138 mmol/L (ref 135–146)
Total Bilirubin: 0.4 mg/dL (ref 0.2–1.2)
Total Protein: 6.7 g/dL (ref 6.1–8.1)
eGFR: 80 mL/min/{1.73_m2} (ref >=60)

## 2022-04-04 LAB — MICROALBUMIN / CREATININE URINE RATIO
Creatinine, Urine: 17 mg/dL — ABNORMAL LOW (ref 20–275)
Microalb, Ur: <0.2 mg/dL

## 2022-04-04 LAB — URINALYSIS, ROUTINE W REFLEX MICROSCOPIC
Bilirubin Urine: NEGATIVE
Glucose, UA: NEGATIVE
Hgb urine dipstick: NEGATIVE
Ketones, ur: NEGATIVE
Leukocytes,Ua: NEGATIVE
Nitrite: NEGATIVE
Protein, ur: NEGATIVE
Specific Gravity, Urine: 1.004 (ref 1.001–1.035)
pH: 6.5 (ref 5.0–8.0)

## 2022-04-04 LAB — TSH: TSH: 0.82 mIU/L (ref 0.40–4.50)

## 2022-04-04 LAB — HEMOGLOBIN A1C
Hgb A1c MFr Bld: 5.9 % of total Hgb — ABNORMAL HIGH (ref <5.7)
Mean Plasma Glucose: 123 mg/dL
eAG (mmol/L): 6.8 mmol/L

## 2022-04-04 LAB — LIPID PANEL
Cholesterol: 196 mg/dL (ref <200)
HDL: 81 mg/dL (ref >=50)
LDL Cholesterol (Calc): 101 mg/dL (calc) — ABNORMAL HIGH
Non-HDL Cholesterol (Calc): 115 mg/dL (calc) (ref <130)
Total CHOL/HDL Ratio: 2.4 (calc) (ref <5.0)
Triglycerides: 49 mg/dL (ref <150)

## 2022-04-04 LAB — VITAMIN D 25 HYDROXY (VIT D DEFICIENCY, FRACTURES): Vit D, 25-Hydroxy: 65 ng/mL (ref 30–100)

## 2022-04-04 LAB — INSULIN, RANDOM: Insulin: 5.4 u[IU]/mL

## 2022-04-04 LAB — MAGNESIUM: Magnesium: 2.1 mg/dL (ref 1.5–2.5)

## 2022-04-04 NOTE — Progress Notes (Signed)
<><><><><><><><><><><><><><><><><><><><><><><><><><><><><><><><><> <><><><><><><><><><><><><><><><><><><><><><><><><><><><><><><><><> - Test results slightly outside the reference range are not unusual. If there is anything important, I will review this with you,  otherwise it is considered normal test values.  If you have further questions,  please do not hesitate to contact me at the office or via My Chart.  <><><><><><><><><><><><><><><><><><><><><><><><><><><><><><><><><> <><><><><><><><><><><><><><><><><><><><><><><><><><><><><><><><><>  -  Total Chol = 196  - Great   - Very low risk for Heart Attack  / Stroke <><><><><><><><><><><><><><><><><><><><><><><><><><><><><><><><><>  - A1c = 5.9%  is back up again in the borderline and                                                             early or pre-diabetes range which has the same   300% increased risk for heart attack, stroke, cancer and                                               alzheimer- type vascular dementia as full blown diabetes.   But the good news is that      diet, exercise with weight loss can                                                                                 cure the early diabetes at this point. <><><><><><><><><><><><><><><><><><><><><><><><><><><><><><><><><>  -  It is very important that you work harder with diet by  avoiding all foods that are white except chicken, fish & calliflower.  - Avoid white rice  (brown & wild rice is OK),   - Avoid white potatoes  (sweet potatoes in moderation is OK),   White bread or wheat bread or anything made out of   white flour like bagels, donuts, rolls, buns, biscuits, cakes,  - pastries, cookies, pizza crust, and pasta (made from                                                                             white flour & egg whites)   - vegetarian pasta or spinach or wheat pasta is OK.  - Multigrain breads like Arnold's, Pepperidge Farm or                                        multigrain sandwich thins or high fiber breads like   Eureka bread or "Dave's Killer" breads that are  4 to 5 grams fiber per slice !  are best.    Diet, exercise and weight loss can reverse and cure                                                                          diabetes in the early stages.    - Diet, exercise and weight loss is very important in the                             control and prevention of complications of diabetes which   affects every system in your body, ie.   -Brain - dementia/stroke,  - eyes - glaucoma/blindness,  - heart - heart attack/heart failure,  - kidneys - dialysis,  - stomach - gastric paralysis,  - intestines - malabsorption,  - nerves - severe painful neuritis,  - circulation - gangrene & loss of a leg(s)  - and finally  . . . . . . . . . . . . . . . . . .    - cancer and Alzheimers. <><><><><><><><><><><><><><><><><><><><><><><><><><><><><><><><><> <><><><><><><><><><><><><><><><><><><><><><><><><><><><><><><><><>  -  Vitamin D =- 65  - Excellent  -   Please keep dose same  <><><><><><><><><><><><><><><><><><><><><><><><><><><><><><><><><>  - All Else - CBC - Kidneys - Electrolytes - Liver - Magnesium & Thyroid    - all  Normal / OK <><><><><><><><><><><><><><><><><><><><><><><><><><><><><><><><><> <><><><><><><><><><><><><><><><><><><><><><><><><><><><><><><><><>

## 2022-04-05 ENCOUNTER — Encounter: Payer: Self-pay | Admitting: Internal Medicine

## 2022-04-05 MED ORDER — GABAPENTIN 100 MG PO CAPS: ORAL_CAPSULE | ORAL | 1 refills | Status: DC

## 2022-04-05 MED ORDER — TERBINAFINE HCL 250 MG PO TABS: ORAL_TABLET | ORAL | 0 refills | Status: DC

## 2022-04-17 ENCOUNTER — Other Ambulatory Visit: Payer: Self-pay

## 2022-04-17 ENCOUNTER — Emergency Department (HOSPITAL_BASED_OUTPATIENT_CLINIC_OR_DEPARTMENT_OTHER): Payer: Medicare Other | Admitting: Radiology

## 2022-04-17 ENCOUNTER — Encounter (HOSPITAL_BASED_OUTPATIENT_CLINIC_OR_DEPARTMENT_OTHER): Payer: Self-pay | Admitting: Emergency Medicine

## 2022-04-17 ENCOUNTER — Emergency Department (HOSPITAL_BASED_OUTPATIENT_CLINIC_OR_DEPARTMENT_OTHER)
Admission: EM | Admit: 2022-04-17 | Discharge: 2022-04-17 | Disposition: A | Discharge status: Home / Self Care | Payer: Medicare Other | Attending: Emergency Medicine | Admitting: Emergency Medicine

## 2022-04-17 DIAGNOSIS — D72819 Decreased white blood cell count, unspecified: Secondary | ICD-10-CM | POA: Diagnosis not present

## 2022-04-17 DIAGNOSIS — Z7982 Long term (current) use of aspirin: Secondary | ICD-10-CM | POA: Diagnosis not present

## 2022-04-17 DIAGNOSIS — R072 Precordial pain: Secondary | ICD-10-CM | POA: Diagnosis present

## 2022-04-17 DIAGNOSIS — R079 Chest pain, unspecified: Secondary | ICD-10-CM

## 2022-04-17 LAB — CBC WITH DIFFERENTIAL/PLATELET
Abs Immature Granulocytes: 0.01 10*3/uL (ref 0.00–0.07)
Basophils Absolute: 0 10*3/uL (ref 0.0–0.1)
Basophils Relative: 0 %
Eosinophils Absolute: 0 10*3/uL (ref 0.0–0.5)
Eosinophils Relative: 1 %
HCT: 36.2 % (ref 36.0–46.0)
Hemoglobin: 12.1 g/dL (ref 12.0–15.0)
Immature Granulocytes: 0 %
Lymphocytes Relative: 49 %
Lymphs Abs: 1.2 10*3/uL (ref 0.7–4.0)
MCH: 28.8 pg (ref 26.0–34.0)
MCHC: 33.4 g/dL (ref 30.0–36.0)
MCV: 86.2 fL (ref 80.0–100.0)
Monocytes Absolute: 0.2 10*3/uL (ref 0.1–1.0)
Monocytes Relative: 6 %
Neutro Abs: 1.1 10*3/uL — ABNORMAL LOW (ref 1.7–7.7)
Neutrophils Relative %: 44 %
Platelets: 156 10*3/uL (ref 150–400)
RBC: 4.2 MIL/uL (ref 3.87–5.11)
RDW: 13.3 % (ref 11.5–15.5)
WBC: 2.5 10*3/uL — ABNORMAL LOW (ref 4.0–10.5)
nRBC: 0 % (ref 0.0–0.2)

## 2022-04-17 LAB — CBC
HCT: 36.9 % (ref 36.0–46.0)
Hemoglobin: 12.4 g/dL (ref 12.0–15.0)
MCH: 29.1 pg (ref 26.0–34.0)
MCHC: 33.6 g/dL (ref 30.0–36.0)
MCV: 86.6 fL (ref 80.0–100.0)
Platelets: 153 10*3/uL (ref 150–400)
RBC: 4.26 MIL/uL (ref 3.87–5.11)
RDW: 13.2 % (ref 11.5–15.5)
WBC: 1.8 10*3/uL — ABNORMAL LOW (ref 4.0–10.5)
nRBC: 0 % (ref 0.0–0.2)

## 2022-04-17 LAB — BASIC METABOLIC PANEL
Anion gap: 8 (ref 5–15)
BUN: 10 mg/dL (ref 8–23)
CO2: 25 mmol/L (ref 22–32)
Calcium: 9.5 mg/dL (ref 8.9–10.3)
Chloride: 104 mmol/L (ref 98–111)
Creatinine, Ser: 0.79 mg/dL (ref 0.44–1.00)
GFR, Estimated: >60 mL/min (ref >60)
Glucose, Bld: 97 mg/dL (ref 70–99)
Potassium: 4.3 mmol/L (ref 3.5–5.1)
Sodium: 137 mmol/L (ref 135–145)

## 2022-04-17 LAB — TROPONIN I (HIGH SENSITIVITY)
Troponin I (High Sensitivity): <2 ng/L (ref <18)
Troponin I (High Sensitivity): <2 ng/L (ref <18)

## 2022-04-17 NOTE — ED Triage Notes (Signed)
Pt arrives to ED with c/o chest pain. She reports that she has "had them off and on throughout the week." She notes she came in today because she woke up with the CP. The CP is epigastric described as dull. Associated with nausea.

## 2022-04-17 NOTE — ED Notes (Signed)
Discharge instructions discussed with pt. Pt verbalized understanding. Pt stable and ambulatory.  °

## 2022-04-17 NOTE — ED Provider Notes (Signed)
[place] EMERGENCY DEPT Provider Note   CSN: [id number] Arrival date & time: 04/17/22  1008     History  Chief Complaint  Patient presents with   Chest Pain    [name] is a 71 y.o. female.  HPI 71 yo female presents today co chest pain with sscp that began last week and is pressure.  Patient had ho hypercholesteremia,  she had a negative gtx a year ago that she reports as normal.  She feels pain is now associated with eating.  Lasts 5-10 minutes.  No vomiting but some nausea.  She waits through pain.  No dyspnea some sweating and lightheadedness.     Home Medications Prior to Admission medications   Medication Sig Start Date End Date Taking? Authorizing Provider  aspirin 81 MG tablet Take 81 mg by mouth daily.    [provider]  Bacillus Coagulans-Inulin (ALIGN PREBIOTIC-PROBIOTIC PO) Take 1 tablet by mouth daily.    [provider]  Cholecalciferol 125 MCG (5000 UT) capsule Take 5,000 Units by mouth daily. Taking 4,000 units    [provider]  ezetimibe (ZETIA) 10 MG tablet Take 1 tablet Dail for Cholesterol 11/15/21   Liane Comber, NP  Fluticasone Propionate (FLONASE ALLERGY RELIEF NA) Place 2 sprays into the nose as needed.    [provider]  gabapentin (NEURONTIN) 100 MG capsule Take 1 capsule  2 to 3 x /day  for Pain 04/05/22   Unk Pinto, MD  hyoscyamine (LEVBID) 0.375 MG 12 hr tablet Take by mouth. 02/25/22 06/25/22  [provider]  Hyoscyamine Sulfate (HYOSCYAMINE PO) Take by mouth as needed.    [provider]  loratadine (CLARITIN) 10 MG tablet Take 1 tablet (10 mg total) by mouth 2 (two) times daily as needed for allergies (Can take an extra dose during flare ups.). Take 1 tablet 1-2 times daily 10/08/21   Kozlow, Donnamarie Poag, MD  Lutein 20 MG TABS Take by mouth.    [provider]  Magnesium 250 MG TABS     [provider]  Multiple Vitamin (MULTIVITAMIN) tablet Take 1  tablet by mouth daily.    [provider]  nitroGLYCERIN (NITROSTAT) 0.4 MG SL tablet Place 0.4 mg under the tongue every 5 (five) minutes as needed for chest pain.    [provider]  pantoprazole (PROTONIX) 40 MG tablet Take 1 tablet (40 mg total) by mouth 2 (two) times daily. 10/08/21   Kozlow, Donnamarie Poag, MD  Polyethylene Glycol 3350 (MIRALAX PO) Take by mouth at bedtime.    [provider]  RESTASIS 0.05 % ophthalmic emulsion instill 1 drop into both eyes twice a day 11/08/14   [provider]  terbinafine (LAMISIL) 250 MG tablet Take 1 tablet Daily for Nail Fungus 04/05/22   Unk Pinto, MD  triamcinolone (NASACORT ALLERGY 24HR) 55 MCG/ACT AERO nasal inhaler Place 2 sprays into the nose 2 (two) times daily. 1-2 sprays per nostril 1-7 times per week. 10/08/21   Kozlow, Donnamarie Poag, MD  vitamin B-12 (CYANOCOBALAMIN) 1000 MCG tablet Take 1,000 mcg by mouth 3 (three) times a week.    [provider]      Allergies    Bactrim [sulfamethoxazole-trimethoprim], Cephalexin, and Sulfa antibiotics    Review of Systems   Review of Systems  Physical Exam Updated Vital Signs BP (!) 125/56   Pulse 62   Temp 97.9 F (36.6 C) (Oral)   Resp 14   Ht 1.626 m ('5\' 4"'$ )  Wt 63.5 kg   SpO2 100%   BMI 24.03 kg/m  Physical Exam  ED Results / Procedures / Treatments   Labs (all labs ordered are listed, but only abnormal results are displayed) Labs Reviewed  CBC - Abnormal; Notable for the following components:      Result Value   WBC 1.8 (*)    All other components within normal limits  CBC WITH DIFFERENTIAL/PLATELET - Abnormal; Notable for the following components:   WBC 2.5 (*)    Neutro Abs 1.1 (*)    All other components within normal limits  BASIC METABOLIC PANEL  TROPONIN I (HIGH SENSITIVITY)  TROPONIN I (HIGH SENSITIVITY)    EKG EKG Interpretation  Date/Time:  Thursday April 17 2022 10:19:13 EDT Ventricular Rate:  62 PR  Interval:  146 QRS Duration: 74 QT Interval:  398 QTC Calculation: 403 R Axis:   66 Text Interpretation: Normal sinus rhythm Normal ECG When compared with ECG of 17-Feb-2021 04:52, PREVIOUS ECG IS PRESENT no change Confirmed by Pattricia Boss [phone]) on 04/17/2022 11:18:23 AM  Radiology DG Chest 2 View  Result Date: 04/17/2022 CLINICAL DATA:  Chest pain. EXAM: CHEST - 2 VIEW COMPARISON:  Chest x-Trystan Akhtar 02/17/2021. FINDINGS: The heart size and mediastinal contours are within normal limits. Both lungs are clear. No visible pleural effusions or pneumothorax. No acute osseous abnormality. IMPRESSION: No active cardiopulmonary disease. Electronically Signed   By: Margaretha Sheffield M.D.   On: 04/17/2022 10:39    Procedures Procedures    Medications Ordered in ED Medications - No data to display  ED Course/ Medical Decision Making/ A&P                           Medical Decision Making 71 year old female presents today with some substernal discomfort.  It is not associated with exertion.  It is more associated with food.  EKG does not show any acutely ischemic changes and appears to be normal.  Troponin and repeat troponin are normal.  Last x-Dandria Griego is clear.  She is not having active chest pain at this time. White blood cell count on initial test was 1800. This was repeated with the differential.  It is 2500 on repeat with 1100 neutrophils.  Unclear etiology would consider viral infection. Doubt ACS based on EKG, type of pain, and troponins are normal. She does not have any dyspnea and doubt PE Chest x-Orvis Stann is clear doubt infection Upper abdomen is soft and nontender.  Not appear to be a upper abdominal etiologies such as acute cholecystitis, pancreatitis, or gastritis Patient is advised regarding need for follow-up.  She is also advised regarding need for recheck of her white blood cell count   Amount and/or Complexity of Data Reviewed Labs: ordered. Decision-making details documented in ED  Course. Radiology: ordered and independent interpretation performed. Decision-making details documented in ED Course. ECG/medicine tests: ordered and independent interpretation performed. Decision-making details documented in ED Course.    Details: Maintain on monitor and continues to be normotensive with normal rate           Final Clinical Impression(s) / ED Diagnoses Final diagnoses:  Chest pain, unspecified type  Leukopenia, unspecified type    Rx / DC Orders ED Discharge Orders     None         Pattricia Boss, MD 04/17/22 1500

## 2022-04-17 NOTE — Discharge Instructions (Signed)
Your chest pain was evaluated with x-Ashlie Mcmenamy, labs, and EKG.  Your heart enzymes are normal and your EKG tracing is normal with a normal x-Donalee Gaumond.  This makes heart source of pain much less likely.  However if you have worsening pain you should be reevaluated. Your labs notes you have a low white blood cell count.  This can be from a lots of reasons.  It does need to be rechecked by your doctor within the next week. Please return if you are having worsening symptoms at any time

## 2022-05-06 ENCOUNTER — Other Ambulatory Visit: Payer: Self-pay

## 2022-05-06 ENCOUNTER — Emergency Department (HOSPITAL_BASED_OUTPATIENT_CLINIC_OR_DEPARTMENT_OTHER)
Admission: EM | Admit: 2022-05-06 | Discharge: 2022-05-06 | Disposition: A | Discharge status: Home / Self Care | Payer: Medicare Other | Attending: Emergency Medicine | Admitting: Emergency Medicine

## 2022-05-06 ENCOUNTER — Encounter (HOSPITAL_BASED_OUTPATIENT_CLINIC_OR_DEPARTMENT_OTHER): Payer: Self-pay

## 2022-05-06 ENCOUNTER — Emergency Department (HOSPITAL_BASED_OUTPATIENT_CLINIC_OR_DEPARTMENT_OTHER): Payer: Medicare Other

## 2022-05-06 DIAGNOSIS — S0990XA Unspecified injury of head, initial encounter: Secondary | ICD-10-CM

## 2022-05-06 DIAGNOSIS — W228XXA Striking against or struck by other objects, initial encounter: Secondary | ICD-10-CM | POA: Diagnosis not present

## 2022-05-06 DIAGNOSIS — R519 Headache, unspecified: Secondary | ICD-10-CM | POA: Insufficient documentation

## 2022-05-06 DIAGNOSIS — Z7982 Long term (current) use of aspirin: Secondary | ICD-10-CM | POA: Diagnosis not present

## 2022-05-06 NOTE — ED Triage Notes (Signed)
Pt reports hitting her head on her hatchback. C/O HA Denies LOC or any blood thinners Skin intact

## 2022-05-06 NOTE — ED Provider Notes (Signed)
[facility] EMERGENCY DEPT Provider Note   CSN: [id number] Arrival date & time: 05/06/22  1741     History  Chief Complaint  Patient presents with   Head Injury    [name] is a 71 y.o. female who presents emergency department with concerns for head injury onset prior to arrival.  Patient notes that she accidentally hit her head on her hatchback of her car.  Patient has associated headache.  Denies medications tried prior to arrival.  Denies LOC, neck pain, back pain, nausea, vomiting, dizziness, lightheadedness.  The history is provided by the patient. No language interpreter was used.       Home Medications Prior to Admission medications   Medication Sig Start Date End Date Taking? Authorizing Provider  aspirin 81 MG tablet Take 81 mg by mouth daily.    [provider]  Bacillus Coagulans-Inulin (ALIGN PREBIOTIC-PROBIOTIC PO) Take 1 tablet by mouth daily.    [provider]  Cholecalciferol 125 MCG (5000 UT) capsule Take 5,000 Units by mouth daily. Taking 4,000 units    [provider]  ezetimibe (ZETIA) 10 MG tablet Take 1 tablet Dail for Cholesterol 11/15/21   Liane Comber, NP  Fluticasone Propionate (FLONASE ALLERGY RELIEF NA) Place 2 sprays into the nose as needed.    [provider]  gabapentin (NEURONTIN) 100 MG capsule Take 1 capsule  2 to 3 x /day  for Pain 04/05/22   Unk Pinto, MD  hyoscyamine (LEVBID) 0.375 MG 12 hr tablet Take by mouth. 02/25/22 06/25/22  [provider]  Hyoscyamine Sulfate (HYOSCYAMINE PO) Take by mouth as needed.    [provider]  loratadine (CLARITIN) 10 MG tablet Take 1 tablet (10 mg total) by mouth 2 (two) times daily as needed for allergies (Can take an extra dose during flare ups.). Take 1 tablet 1-2 times daily 10/08/21   Kozlow, Donnamarie Poag, MD  Lutein 20 MG TABS Take by mouth.    [provider]  Magnesium 250 MG TABS     [provider]  Multiple  Vitamin (MULTIVITAMIN) tablet Take 1 tablet by mouth daily.    [provider]  nitroGLYCERIN (NITROSTAT) 0.4 MG SL tablet Place 0.4 mg under the tongue every 5 (five) minutes as needed for chest pain.    [provider]  pantoprazole (PROTONIX) 40 MG tablet Take 1 tablet (40 mg total) by mouth 2 (two) times daily. 10/08/21   Kozlow, Donnamarie Poag, MD  Polyethylene Glycol 3350 (MIRALAX PO) Take by mouth at bedtime.    [provider]  RESTASIS 0.05 % ophthalmic emulsion instill 1 drop into both eyes twice a day 11/08/14   [provider]  terbinafine (LAMISIL) 250 MG tablet Take 1 tablet Daily for Nail Fungus 04/05/22   Unk Pinto, MD  triamcinolone (NASACORT ALLERGY 24HR) 55 MCG/ACT AERO nasal inhaler Place 2 sprays into the nose 2 (two) times daily. 1-2 sprays per nostril 1-7 times per week. 10/08/21   Kozlow, Donnamarie Poag, MD  vitamin B-12 (CYANOCOBALAMIN) 1000 MCG tablet Take 1,000 mcg by mouth 3 (three) times a week.    [provider]      Allergies    Bactrim [sulfamethoxazole-trimethoprim], Cephalexin, and Sulfa antibiotics    Review of Systems   Review of Systems  All other systems reviewed and are negative.   Physical Exam Updated Vital Signs BP 121/70 (BP Location: Right Arm)   Pulse 63   Temp 97.8 F (36.6 C)   Resp  18   Ht '5\' 4"'$  (1.626 m)   Wt 63.5 kg   SpO2 100%   BMI 24.03 kg/m  Physical Exam Vitals and nursing note reviewed.  Constitutional:      General: She is not in acute distress.    Appearance: She is not diaphoretic.  HENT:     Head: Normocephalic and atraumatic.     Comments: No obvious skin changes noted to scalp of head.    Mouth/Throat:     Pharynx: No oropharyngeal exudate.  Eyes:     General: No scleral icterus.    Conjunctiva/sclera: Conjunctivae normal.  Cardiovascular:     Rate and Rhythm: Normal rate and regular rhythm.     Pulses: Normal pulses.     Heart sounds: Normal heart sounds.  Pulmonary:      Effort: Pulmonary effort is normal. No respiratory distress.     Breath sounds: Normal breath sounds. No wheezing.  Abdominal:     General: Bowel sounds are normal.     Palpations: Abdomen is soft. There is no mass.     Tenderness: There is no abdominal tenderness. There is no guarding or rebound.  Musculoskeletal:        General: Normal range of motion.     Cervical back: Normal range of motion and neck supple.     Comments: No spinal tenderness to palpation.  Skin:    General: Skin is warm and dry.  Neurological:     Mental Status: She is alert.     Comments: No focal neurological deficits. Negative pronator drift. Able to ambulate without assistance or difficulty. Strength and sensation intact to BUE and BLE. Grip strength 5/5 bilaterally. Cranial nerves II through XII intact.  Psychiatric:        Behavior: Behavior normal.     ED Results / Procedures / Treatments   Labs (all labs ordered are listed, but only abnormal results are displayed) Labs Reviewed - No data to display  EKG None  Radiology CT Head Wo Contrast  Result Date: 05/06/2022 CLINICAL DATA:  Head trauma, minor (Age >= 65y).  Hit head EXAM: CT HEAD WITHOUT CONTRAST TECHNIQUE: Contiguous axial images were obtained from the base of the skull through the vertex without intravenous contrast. RADIATION DOSE REDUCTION: This exam was performed according to the departmental dose-optimization program which includes automated exposure control, adjustment of the mA and/or kV according to patient size and/or use of iterative reconstruction technique. COMPARISON:  10/22/2021 FINDINGS: Brain: No acute intracranial abnormality. Specifically, no hemorrhage, hydrocephalus, mass lesion, acute infarction, or significant intracranial injury. Vascular: No hyperdense vessel or unexpected calcification. Skull: No acute calvarial abnormality. Sinuses/Orbits: No acute findings Other: None IMPRESSION: Normal study. Electronically Signed   By:  Rolm Baptise M.D.   On: 05/06/2022 20:09    Procedures Procedures    Medications Ordered in ED Medications - No data to display  ED Course/ Medical Decision Making/ A&P                           Medical Decision Making Amount and/or Complexity of Data Reviewed Radiology: ordered.   Pt presents with head injury onset prior to arrival.  No LOC, neck pain, back pain, anticoagulant use. Vital signs, patient afebrile. On exam, pt with No focal neurological deficits. Negative pronator drift. Able to ambulate without assistance or difficulty. Strength and sensation intact to BUE and BLE. Grip strength 5/5 bilaterally. Cranial nerves II through XII intact. No  obvious skin changes noted to scalp of head.  No spinal tenderness to palpation.  No acute cardiovascular respiratory exam findings.  Differential diagnosis includes intracranial normality, fracture, dislocation, intracranial bleed.   Imaging: I ordered imaging studies including CT head without I independently visualized and interpreted imaging which showed: No acute findings I agree with the radiologist interpretation  Disposition: Presentation suspicious for head injury.  Doubt at this time intracranial abnormalities or bleed.  Doubt fracture or dislocation at this time. After consideration of the diagnostic results and the patients response to treatment, I feel that the patient would benefit from Discharge home. Supportive care measures and strict return precautions discussed with patient at bedside. Pt acknowledges and verbalizes understanding. Pt appears safe for discharge. Follow up as indicated in discharge paperwork.    This chart was dictated using voice recognition software, Dragon. Despite the best efforts of this provider to proofread and correct errors, errors may still occur which can change documentation meaning.   Final Clinical Impression(s) / ED Diagnoses Final diagnoses:  None    Rx / DC Orders ED Discharge  Orders     None         Giovanni Biby A, PA-C 05/06/22 2024    Regan Lemming, MD 05/06/22 2114

## 2022-05-06 NOTE — Discharge Instructions (Signed)
It was a pleasure taking care of you today!  Your CT scan did not show any concerning findings today.  You may take over-the-counter 500 mg Tylenol every 6 hours as needed for your pain and alternate with 600 mg ibuprofen every 6 hours for no more than 7 days.  You may follow-up with your primary care provider regarding today's ED visit.  Return to the emergency department if you experience increasing/worsening symptoms.

## 2022-05-15 ENCOUNTER — Ambulatory Visit (INDEPENDENT_AMBULATORY_CARE_PROVIDER_SITE_OTHER): Payer: Medicare Other

## 2022-05-15 VITALS — BP 100/52 | HR 79 | Temp 97.2°F | Ht 64.0 in | Wt 143.4 lb

## 2022-05-15 DIAGNOSIS — Z79899 Other long term (current) drug therapy: Secondary | ICD-10-CM | POA: Diagnosis not present

## 2022-05-15 NOTE — Progress Notes (Signed)
Patient did not start the medication for toenail fungus.  She said that the gabapentin was not working.  Dr. Melford Aase said that she could go up to 3 tabs of '100mg'$  TID. I told the patient it could make her dizzy, and drowsy and not to drive while taking the medication. Told her if not improving that she will need to make an appointment.

## 2022-05-30 ENCOUNTER — Other Ambulatory Visit: Payer: Self-pay

## 2022-05-30 DIAGNOSIS — Z1211 Encounter for screening for malignant neoplasm of colon: Secondary | ICD-10-CM

## 2022-05-30 LAB — POC HEMOCCULT BLD/STL (HOME/3-CARD/SCREEN)
Card #2 Fecal Occult Blod, POC: NEGATIVE
Card #3 Fecal Occult Blood, POC: NEGATIVE
Fecal Occult Blood, POC: NEGATIVE

## 2022-06-02 DIAGNOSIS — Z1211 Encounter for screening for malignant neoplasm of colon: Secondary | ICD-10-CM | POA: Diagnosis not present

## 2022-06-02 DIAGNOSIS — Z1212 Encounter for screening for malignant neoplasm of rectum: Secondary | ICD-10-CM | POA: Diagnosis not present

## 2022-07-07 NOTE — Progress Notes (Signed)
THIS ENCOUNTER IS A VIRTUAL VISIT DUE TO COVID-19 - PATIENT WAS NOT SEEN IN THE OFFICE.  PATIENT HAS CONSENTED TO VIRTUAL VISIT / TELEMEDICINE VISIT   Virtual Visit via telephone Note  I connected with  [name] on 07/08/2022 by telephone.  I verified that I am speaking with the correct person using two identifiers.    I discussed the limitations of evaluation and management by telemedicine and the availability of in person appointments. The patient expressed understanding and agreed to proceed.  History of Present Illness:  Pulse 75   Temp 98 F (36.7 C)   Ht '5\' 4"'$  (1.626 m)   SpO2 96%   BMI 24.61 kg/m  72 y.o. patient contacted office reporting URI sx . she tested positive by test in office parking lot today. OV was conducted by telephone to minimize exposure. This patient was vaccinated for covid 19, last 12/2020 Immunization History  Administered Date(s) Administered   Influenza, High Dose Seasonal PF 06/25/2017   Influenza-Unspecified 03/24/2018, 03/29/2019, 03/05/2020   PFIZER Comirnaty(Gray Top)Covid-19 Tri-Sucrose Vaccine 12/26/2020   PFIZER(Purple Top)SARS-COV-2 Vaccination 07/08/2019, 07/29/2019, 03/20/2020   PPD Test 12/12/2014, 12/26/2015, 01/15/2017   Pneumococcal Conjugate-13 11/10/2018   Pneumococcal Polysaccharide-23 10/02/2021   Td 05/18/2007   Zoster, Live 05/17/2006     Sx began 2 days ago with productive cough of yellow phlegm, congestion  Treatments tried so far: Tylenol and Robitussin  Exposures: unknown   Medications   Current Outpatient Medications (Cardiovascular):    ezetimibe (ZETIA) 10 MG tablet, Take 1 tablet Dail for Cholesterol   nitroGLYCERIN (NITROSTAT) 0.4 MG SL tablet, Place 0.4 mg under the tongue every 5 (five) minutes as needed for chest pain.  Current Outpatient Medications (Respiratory):    Fluticasone Propionate (FLONASE ALLERGY RELIEF NA), Place 2 sprays into the nose as needed.   loratadine (CLARITIN) 10 MG tablet, Take  1 tablet (10 mg total) by mouth 2 (two) times daily as needed for allergies (Can take an extra dose during flare ups.). Take 1 tablet 1-2 times daily   triamcinolone (NASACORT ALLERGY 24HR) 55 MCG/ACT AERO nasal inhaler, Place 2 sprays into the nose 2 (two) times daily. 1-2 sprays per nostril 1-7 times per week.  Current Outpatient Medications (Analgesics):    aspirin 81 MG tablet, Take 81 mg by mouth daily.  Current Outpatient Medications (Hematological):    vitamin B-12 (CYANOCOBALAMIN) 1000 MCG tablet, Take 1,000 mcg by mouth 3 (three) times a week.  Current Outpatient Medications (Other):    Bacillus Coagulans-Inulin (ALIGN PREBIOTIC-PROBIOTIC PO), Take 1 tablet by mouth daily.   Cholecalciferol 125 MCG (5000 UT) capsule, Take 5,000 Units by mouth daily. Taking 4,000 units   gabapentin (NEURONTIN) 100 MG capsule, Take 1 capsule  2 to 3 x /day  for Pain   Hyoscyamine Sulfate (HYOSCYAMINE PO)*, Take by mouth as needed.   Lutein 20 MG TABS, Take by mouth.   Magnesium 250 MG TABS,    Multiple Vitamin (MULTIVITAMIN) tablet, Take 1 tablet by mouth daily.   pantoprazole (PROTONIX) 40 MG tablet, Take 1 tablet (40 mg total) by mouth 2 (two) times daily.   Polyethylene Glycol 3350 (MIRALAX PO), Take by mouth at bedtime.   RESTASIS 0.05 % ophthalmic emulsion, instill 1 drop into both eyes twice a day   terbinafine (LAMISIL) 250 MG tablet, Take 1 tablet Daily for Nail Fungus * These medications belong to multiple therapeutic classes and are listed under each applicable group.  Allergies:  Allergies  Allergen Reactions  Bactrim [Sulfamethoxazole-Trimethoprim] Other (See Comments)    Patient preference to take medication without sulfa   Cephalexin Nausea And Vomiting    Rash; chest pain Rash; chest pain   Sulfa Antibiotics     Problem list She has GERD (gastroesophageal reflux disease); Vitamin D deficiency; Hyperlipidemia, mixed; Labile hypertension; Medication management; Abnormal  glucose; Osteoporosis; Bigeminy; Upper airway cough syndrome; Fear of side effects of medication; Environmental allergies; Cholesterolosis of gallbladder; Irritable bowel syndrome with constipation; Pain in right knee; Neutropenia ([place]); and B12 deficiency on their problem list.   Social History:   reports that she has never smoked. She has never been exposed to tobacco smoke. She has never used smokeless tobacco. She reports that she does not currently use alcohol. She reports that she does not use drugs.  Observations/Objective:  General : Well sounding patient in no apparent distress HEENT: no hoarseness, no cough for duration of visit Lungs: speaks in complete sentences, no audible wheezing, no apparent distress Neurological: alert, oriented x 3 Psychiatric: pleasant, judgement appropriate   Assessment and Plan:  Covid 19 Covid 19 positive per rapid screening test in parking lot of office today Risk factors include: has GERD (gastroesophageal reflux disease); Vitamin D deficiency; Hyperlipidemia, mixed; Labile hypertension; Medication management; Abnormal glucose; Osteoporosis; Bigeminy; Upper airway cough syndrome; Fear of side effects of medication; Environmental allergies; Cholesterolosis of gallbladder; Irritable bowel syndrome with constipation; Pain in right knee; Neutropenia ([place]); and B12 deficiency on their problem list.  Symptoms are: mild Due to co morbid conditions and risk factors, discussed antivirals  Immue support reviewed Vit D, Vit C, and Zinc Take tylenol PRN temp 101+ Push hydration Regular ambulation or calf exercises exercises for clot prevention and 81 mg ASA unless contraindicated Sx supportive therapy suggested Follow up via mychart or telephone if needed Advised patient obtain O2 monitor; present to ED if persistently <90% or with severe dyspnea, CP, fever uncontrolled by tylenol, confusion, sudden decline Should remain in isolation 5 days from testing  positive and then wear a mask when around other people for the following 5 days  [name] was seen today for acute visit.  Diagnoses and all orders for this visit:  Flu-like symptoms -     POCT Influenza A/B- negative  Encounter for screening for COVID-19 -     POC COVID-19- positive  COVID -     molnupiravir EUA (LAGEVRIO) 200 MG CAPS capsule; Take 4 capsules (800 mg total) by mouth 2 (two) times daily for 5 days. -     azithromycin (ZITHROMAX) 250 MG tablet; Take 2 tablets (500 mg) on  Day 1,  followed by 1 tablet (250 mg) once daily on Days 2 through 5. -     promethazine-dextromethorphan (PROMETHAZINE-DM) 6.25-15 MG/5ML syrup; Take 5 mLs by mouth 4 (four) times daily as needed for cough. -     benzonatate (TESSALON PERLES) 100 MG capsule; Take 1 capsule (100 mg total) by mouth every 6 (six) hours as needed for cough.       Follow Up Instructions:  I discussed the assessment and treatment plan with the patient. The patient was provided an opportunity to ask questions and all were answered. The patient agreed with the plan and demonstrated an understanding of the instructions.   The patient was advised to call back or seek an in-person evaluation if the symptoms worsen or if the condition fails to improve as anticipated.  I provided 20 minutes of non-face-to-face time during this encounter.   Elmus Mathes Mikki Santee,  NP

## 2022-07-08 ENCOUNTER — Ambulatory Visit: Payer: Medicare Other | Admitting: Nurse Practitioner

## 2022-07-08 ENCOUNTER — Other Ambulatory Visit: Payer: Self-pay

## 2022-07-08 ENCOUNTER — Encounter: Payer: Self-pay | Admitting: Nurse Practitioner

## 2022-07-08 VITALS — HR 75 | Temp 98.0°F | Ht 64.0 in

## 2022-07-08 DIAGNOSIS — U071 COVID-19: Secondary | ICD-10-CM

## 2022-07-08 DIAGNOSIS — F40298 Other specified phobia: Secondary | ICD-10-CM

## 2022-07-08 DIAGNOSIS — R058 Other specified cough: Secondary | ICD-10-CM

## 2022-07-08 DIAGNOSIS — M810 Age-related osteoporosis without current pathological fracture: Secondary | ICD-10-CM

## 2022-07-08 DIAGNOSIS — Z79899 Other long term (current) drug therapy: Secondary | ICD-10-CM

## 2022-07-08 DIAGNOSIS — R0989 Other specified symptoms and signs involving the circulatory and respiratory systems: Secondary | ICD-10-CM

## 2022-07-08 DIAGNOSIS — K219 Gastro-esophageal reflux disease without esophagitis: Secondary | ICD-10-CM

## 2022-07-08 DIAGNOSIS — Z1152 Encounter for screening for COVID-19: Secondary | ICD-10-CM

## 2022-07-08 DIAGNOSIS — K581 Irritable bowel syndrome with constipation: Secondary | ICD-10-CM

## 2022-07-08 DIAGNOSIS — R6889 Other general symptoms and signs: Secondary | ICD-10-CM | POA: Diagnosis not present

## 2022-07-08 DIAGNOSIS — R7309 Other abnormal glucose: Secondary | ICD-10-CM

## 2022-07-08 DIAGNOSIS — E782 Mixed hyperlipidemia: Secondary | ICD-10-CM

## 2022-07-08 DIAGNOSIS — Z0000 Encounter for general adult medical examination without abnormal findings: Secondary | ICD-10-CM

## 2022-07-08 DIAGNOSIS — E559 Vitamin D deficiency, unspecified: Secondary | ICD-10-CM

## 2022-07-08 DIAGNOSIS — I498 Other specified cardiac arrhythmias: Secondary | ICD-10-CM

## 2022-07-08 LAB — POCT INFLUENZA A/B
Influenza A, POC: NEGATIVE
Influenza B, POC: NEGATIVE

## 2022-07-08 LAB — POC COVID19 BINAXNOW: SARS Coronavirus 2 Ag: POSITIVE — AB

## 2022-07-08 MED ORDER — MOLNUPIRAVIR 200 MG PO CAPS: 4.0000 | ORAL_CAPSULE | Freq: Two times a day (BID) | ORAL | 0 refills | Status: AC

## 2022-07-08 MED ORDER — PROMETHAZINE-DM 6.25-15 MG/5ML PO SYRP: 5.0000 mL | ORAL_SOLUTION | Freq: Four times a day (QID) | ORAL | 1 refills | Status: DC | PRN

## 2022-07-08 MED ORDER — AZITHROMYCIN 250 MG PO TABS: ORAL_TABLET | ORAL | 1 refills | Status: DC

## 2022-07-08 MED ORDER — BENZONATATE 100 MG PO CAPS: 100.0000 mg | ORAL_CAPSULE | Freq: Four times a day (QID) | ORAL | 1 refills | Status: DC | PRN

## 2022-07-08 NOTE — Patient Instructions (Signed)
Immue support reviewed Vit D, Vit C, and Zinc Take tylenol PRN temp 101+ Push hydration Regular ambulation or calf exercises exercises for clot prevention and 81 mg ASA unless contraindicated Sx supportive therapy suggested Follow up via mychart or telephone if needed Advised patient obtain O2 monitor; present to ED if persistently <90% or with severe dyspnea, CP, fever uncontrolled by tylenol, confusion, sudden decline Should remain in isolation 5 days from testing positive and then wear a mask when around other people for the following 5 days

## 2022-07-21 ENCOUNTER — Ambulatory Visit (INDEPENDENT_AMBULATORY_CARE_PROVIDER_SITE_OTHER): Payer: Medicare Other | Admitting: Nurse Practitioner

## 2022-07-21 ENCOUNTER — Encounter: Payer: Self-pay | Admitting: Nurse Practitioner

## 2022-07-21 VITALS — BP 100/50 | HR 64 | Temp 97.7°F | Ht 64.0 in | Wt 144.0 lb

## 2022-07-21 DIAGNOSIS — M25552 Pain in left hip: Secondary | ICD-10-CM

## 2022-07-21 DIAGNOSIS — M25551 Pain in right hip: Secondary | ICD-10-CM | POA: Diagnosis not present

## 2022-07-21 DIAGNOSIS — R7309 Other abnormal glucose: Secondary | ICD-10-CM | POA: Diagnosis not present

## 2022-07-21 NOTE — Progress Notes (Signed)
Assessment and Plan:  [name] was seen today for hip pain.  Diagnoses and all orders for this visit:  Bilateral hip pain Complete treatment course at [facility] and if no improvement or they feel symptoms are possibly neurological in nature notify the office and will refer for neurology consult  Abnormal glucose Continue diet and exercise Limit simple carbs and increase protein        Further disposition pending results of labs. Discussed med's effects and SE's.   Over 30 minutes of exam, counseling, chart review, and critical decision making was performed.   Future Appointments  Date Time Provider [place]  07/23/2022  9:40 AM Philemon Kingdom, MD [facility] None  10/13/2022 10:30 AM Unk Pinto, MD [facility] None  04/09/2023 10:00 AM Unk Pinto, MD [facility] None    ------------------------------------------------------------------------------------------------------------------   HPI BP (!) 100/50   Pulse 64   Temp 97.7 F (36.5 C)   Ht '5\' 4"'$  (1.626 m)   Wt 144 lb (65.3 kg)   SpO2 98%   BMI 24.72 kg/m   72 y.o.female presents for complaints of pain in both hips. Complains of cramping/spasm pain down both legs from hips.  Gabapentin is not helping with the pain.  Was evaluated by [facility] and they had her complete PT exercises, still in treatment.  She describes the pain as an aching that will radiate down the legs. Occurs intermittently. She does have burning sensation that goes down thighs bilaterally.   She is also concerned about last A1c which was elevated, is on no current medication for abnormal glucose but does not want for it to go higher Lab Results  Component Value Date   HGBA1C 5.9 (H) 04/03/2022     Past Medical History:  Diagnosis Date   Arthritis    cervical and lumbar   Cataract    Mixed OU   Costochondritis 08/27/2020   Depression    GERD (gastroesophageal reflux disease)    Hyperlipidemia    IBS (irritable  bowel syndrome)    Osteoporosis    Prediabetes    Vitamin D deficiency      Allergies  Allergen Reactions   Bactrim [Sulfamethoxazole-Trimethoprim] Other (See Comments)    Patient preference to take medication without sulfa   Cephalexin Nausea And Vomiting    Rash; chest pain Rash; chest pain   Sulfa Antibiotics     Current Outpatient Medications on File Prior to Visit  Medication Sig   Acetaminophen (TYLENOL 8 HOUR PO) Take by mouth.   aspirin 81 MG tablet Take 81 mg by mouth daily.   Bacillus Coagulans-Inulin (ALIGN PREBIOTIC-PROBIOTIC PO) Take 1 tablet by mouth daily.   benzonatate (TESSALON PERLES) 100 MG capsule Take 1 capsule (100 mg total) by mouth every 6 (six) hours as needed for cough.   Cholecalciferol 125 MCG (5000 UT) capsule Take 5,000 Units by mouth daily. Taking 4,000 units   ezetimibe (ZETIA) 10 MG tablet Take 1 tablet Dail for Cholesterol   Fluticasone Propionate (FLONASE ALLERGY RELIEF NA) Place 2 sprays into the nose as needed.   Hyoscyamine Sulfate (HYOSCYAMINE PO) Take by mouth as needed.   loratadine (CLARITIN) 10 MG tablet Take 1 tablet (10 mg total) by mouth 2 (two) times daily as needed for allergies (Can take an extra dose during flare ups.). Take 1 tablet 1-2 times daily   Lutein 20 MG TABS Take by mouth.   Magnesium 250 MG TABS    Multiple Vitamin (MULTIVITAMIN) tablet Take 1 tablet by mouth daily.  nitroGLYCERIN (NITROSTAT) 0.4 MG SL tablet Place 0.4 mg under the tongue every 5 (five) minutes as needed for chest pain.   Polyethylene Glycol 3350 (MIRALAX PO) Take by mouth at bedtime.   promethazine-dextromethorphan (PROMETHAZINE-DM) 6.25-15 MG/5ML syrup Take 5 mLs by mouth 4 (four) times daily as needed for cough.   vitamin B-12 (CYANOCOBALAMIN) 1000 MCG tablet Take 1,000 mcg by mouth 3 (three) times a week.   azithromycin (ZITHROMAX) 250 MG tablet Take 2 tablets (500 mg) on  Day 1,  followed by 1 tablet (250 mg) once daily on Days 2 through 5. (Patient  not taking: Reported on 07/21/2022)   gabapentin (NEURONTIN) 100 MG capsule Take 1 capsule  2 to 3 x /day  for Pain (Patient not taking: Reported on 07/21/2022)   pantoprazole (PROTONIX) 40 MG tablet Take 1 tablet (40 mg total) by mouth 2 (two) times daily. (Patient not taking: Reported on 07/08/2022)   RESTASIS 0.05 % ophthalmic emulsion instill 1 drop into both eyes twice a day   terbinafine (LAMISIL) 250 MG tablet Take 1 tablet Daily for Nail Fungus (Patient not taking: Reported on 07/08/2022)   triamcinolone (NASACORT ALLERGY 24HR) 55 MCG/ACT AERO nasal inhaler Place 2 sprays into the nose 2 (two) times daily. 1-2 sprays per nostril 1-7 times per week. (Patient not taking: Reported on 07/21/2022)   No current facility-administered medications on file prior to visit.    ROS: all negative except above.   Physical Exam:  BP (!) 100/50   Pulse 64   Temp 97.7 F (36.5 C)   Ht '5\' 4"'$  (1.626 m)   Wt 144 lb (65.3 kg)   SpO2 98%   BMI 24.72 kg/m   General Appearance: Well nourished, in no apparent distress. Eyes: PERRLA, EOMs, conjunctiva no swelling or erythema Sinuses: No Frontal/maxillary tenderness ENT/Mouth: Ext aud canals clear, TMs without erythema, bulging. No erythema, swelling, or exudate on post pharynx.  Tonsils not swollen or erythematous. Hearing normal.  Neck: Supple, thyroid normal.  Respiratory: Respiratory effort normal, BS equal bilaterally without rales, rhonchi, wheezing or stridor.  Cardio: RRR with no MRGs. Brisk peripheral pulses without edema.  Abdomen: Soft, + BS.  Non tender, no guarding, rebound, hernias, masses. Lymphatics: Non tender without lymphadenopathy.  Musculoskeletal: Full ROM, 5/5 strength, normal gait.  Skin: Warm, dry without rashes, lesions, ecchymosis.  Neuro: Cranial nerves intact. Normal muscle tone, no cerebellar symptoms. Sensation intact.  Psych: Awake and oriented X 3, normal affect, Insight and Judgment appropriate.     Alycia Rossetti,  NP 3:40 PM [facility]

## 2022-07-21 NOTE — Patient Instructions (Signed)
Sciatica  Sciatica is pain, weakness, tingling, or loss of feeling (numbness) along the sciatic nerve. The sciatic nerve starts in the lower back and goes down the back of each leg. Sciatica usually affects one side of the body. Sciatica usually goes away on its own or with treatment. Sometimes, sciatica may come back. What are the causes? This condition happens when the sciatic nerve is pinched or has pressure put on it. This may be caused by: A disk in between the bones of the spine bulging out too far (herniated disk). Changes in the spinal disks due to aging. A condition that affects a muscle in the butt. Extra bone growth near the sciatic nerve. A break (fracture) of the area between your hip bones (pelvis). Pregnancy. Tumor. This is rare. What increases the risk? You are more likely to develop this condition if you: Play sports that put pressure or stress on the spine. Have poor strength and ease of movement (flexibility). Have had a back injury or back surgery. Sit for long periods of time. Do activities that involve bending or lifting over and over again. Are very overweight (obese). What are the signs or symptoms? Symptoms can vary from mild to very bad. They may include: Any of these problems in the lower back, leg, hip, or butt: Mild tingling, loss of feeling, or dull aches. A burning feeling. Sharp pains. Loss of feeling in the back of the calf or the sole of the foot. Leg weakness. Very bad back pain that makes it hard to move. These symptoms may get worse when you cough, sneeze, or laugh. They may also get worse when you sit or stand for long periods of time. How is this treated? This condition often gets better without any treatment. However, treatment may include: Changing or cutting back on physical activity when you have pain. Exercising, including strengthening and stretching. Putting ice or heat on the affected area. Shots of medicines to relieve pain and  swelling or to relax your muscles. Surgery. Follow these instructions at home: Medicines Take over-the-counter and prescription medicines only as told by your doctor. Ask your doctor if you should avoid driving or using machines while you are taking your medicine. Managing pain     If told, put ice on the affected area. To do this: Put ice in a plastic bag. Place a towel between your skin and the bag. Leave the ice on for 20 minutes, 2-3 times a day. If your skin turns bright red, take off the ice right away to prevent skin damage. The risk of skin damage is higher if you cannot feel pain, heat, or cold. If told, put heat on the affected area. Do this as often as told by your doctor. Use the heat source that your doctor tells you to use, such as a moist heat pack or a heating pad. Place a towel between your skin and the heat source. Leave the heat on for 20-30 minutes. If your skin turns bright red, take off the heat right away to prevent burns. The risk of burns is higher if you cannot feel pain, heat, or cold. Activity  Return to your normal activities when your doctor says that it is safe. Avoid activities that make your symptoms worse. Take short rests during the day. When you rest for a long time, do some physical activity or stretching between periods of rest. Avoid sitting for a long time without moving. Get up and move around at least one time each   hour. Do exercises and stretches as told by your doctor. Do not lift anything that is heavier than 10 lb (4.5 kg). Avoid lifting heavy things even when you do not have symptoms. Avoid lifting heavy things over and over. When you lift objects, always lift in a way that is safe for your body. To do this, you should: Bend your knees. Keep the object close to your body. Avoid twisting. General instructions Stay at a healthy weight. Wear comfortable shoes that support your feet. Avoid wearing high heels. Avoid sleeping on a mattress  that is too soft or too hard. You might have less pain if you sleep on a mattress that is firm enough to support your back. Contact a doctor if: Your pain is not controlled by medicine. Your pain does not get better. Your pain gets worse. Your pain lasts longer than 4 weeks. You lose weight without trying. Get help right away if: You cannot control when you pee (urinate) or poop (have a bowel movement). You have weakness in any of these areas and it gets worse: Lower back. The area between your hip bones. Butt. Legs. You have redness or swelling of your back. You have a burning feeling when you pee. Summary Sciatica is pain, weakness, tingling, or loss of feeling (numbness) along the sciatic nerve. This may include the lower back, legs, hips, and butt. This condition happens when the sciatic nerve is pinched or has pressure put on it. Treatment often includes rest, exercise, medicines, and putting ice or heat on the affected area. This information is not intended to replace advice given to you by your health care provider. Make sure you discuss any questions you have with your health care provider. Document Revised: 09/09/2021 Document Reviewed: 09/09/2021 Elsevier Patient Education  2023 [employer] Inc.  

## 2022-07-23 ENCOUNTER — Encounter: Payer: Self-pay | Admitting: Internal Medicine

## 2022-07-23 ENCOUNTER — Ambulatory Visit: Payer: Medicare Other | Admitting: Internal Medicine

## 2022-07-23 VITALS — BP 128/70 | HR 88 | Ht 64.0 in | Wt 145.2 lb

## 2022-07-23 DIAGNOSIS — M810 Age-related osteoporosis without current pathological fracture: Secondary | ICD-10-CM | POA: Diagnosis not present

## 2022-07-23 DIAGNOSIS — E041 Nontoxic single thyroid nodule: Secondary | ICD-10-CM

## 2022-07-23 NOTE — Progress Notes (Addendum)
Patient ID: [name], female   DOB: [birth year], 72 y.o.   MRN: [id number]   HPI  [name] is a 72 y.o.-year-old female, initially referred by her PCP, Dr. Melford Aase, returning for follow-up for osteoporosis (OP).  She previously saw Dr. Kelton Pillar in our practice in 04/2020 but wanted a second opinion.  Last visit with me 1 year ago.  Interim history: No fractures or falls since last visit. No dizziness/orthostasis/vertigo/poor vision. She continues to have hip pain when sleeping if she does not use a pillow between her legs. She has R>L sciatica for which she saw PCP and orthopedics (imaging >> slipped disk) - now on physical therapy. She is that her acid reflux is better and is off her Protonix.  However, it is not completely resolved.  Reviewed and addended history: Pt was dx with OP in 2019.  I reviewed pt's DXA scan reports: Date L1-L4 T score FN T score 33% distal Radius  03/05/2022 ([facility]) -3.1 RFN: -2.0 LFN: -2.1 N/a  02/14/2020 ([facility])  -3.3 RFN: -2.1 LFN: -2.1 n/a  08/05/2017 ([facility])  -3.6 RFN: -2.1 LFN: -2.2 n/a   Previous OP treatments:  - she was recommended Fosamax 70 mg weekly by Dr. 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 Reviewed available vit D levels: Lab Results  Component Value Date   VD25OH 65 04/03/2022   VD25OH 93.60 07/17/2021   VD25OH 58 04/03/2021   VD25OH 83.8 07/11/2020   VD25OH 83 03/20/2020   VD25OH 71 08/29/2019   VD25OH 59 02/17/2019   VD25OH 74 11/10/2018   VD25OH 58 08/10/2018   VD25OH 70 01/28/2018   Pt is on: - vitamin D 4000 units daily at last visit, + also +/- multivitamin The dose was decreased in 04/2020.  She is not taking calcium due to constipation.  She is not drinking milk due to hyperlipidemia. Drinks almond milk.  No weight bearing exercises.  She was walking 3-5 times weekly, 45 minutes at a  time.  Also was using an elliptical.  She is not even doing these now due to sciatica and lateral hip pain (?bursitis).  She does not take high vitamin A doses.  No recent steroid use.  She had prednisone tapers in the past.  No smoking.  Alcohol: Beer or wine occasionally.  Menopause was at 72 y/o.  No history of HRT.  No family history of osteoporosis.  No h/o hyper/hypocalcemia or hyperparathyroidism. No h/o kidney stones. Lab Results  Component Value Date   CALCIUM 9.5 04/17/2022   CALCIUM 9.4 04/03/2022   CALCIUM 9.8 02/25/2022   CALCIUM 9.6 01/22/2022   CALCIUM 9.7 10/02/2021   CALCIUM 9.6 04/03/2021   CALCIUM 9.2 02/17/2021   CALCIUM 9.6 01/25/2021   CALCIUM 9.8 10/22/2020   CALCIUM 9.8 07/09/2020   No h/o thyrotoxicosis. Reviewed TSH recent levels:  Lab Results  Component Value Date   TSH 0.82 04/03/2022   TSH 1.30 10/02/2021   TSH 1.21 04/03/2021   TSH 0.97 01/25/2021   TSH 1.46 10/22/2020   No h/o CKD. Last BUN/Cr: Lab Results  Component Value Date   BUN 10 04/17/2022   CREATININE 0.79 04/17/2022   She has a history of GERD >> was on Omeprazole and Mylanta >> now off .  She also has a history of small hiatal hernia.  She was found  to have an incidental right thyroid nodule on head and neck CT.  Thyroid ultrasound (05/23/2021): Parenchymal Echotexture: Mildly heterogeneous Isthmus: 0.2 cm  Right lobe: 4.6 x 2.0 x 1.8 cm  Left lobe: 4.4 x 1.5 x 1.4 cm  ___________________________________________________________________________   Nodule # 2:  Location: Right; inferior  Maximum size: 2.1 cm; Other 2 dimensions: 1.6 x 1.4 cm  Composition: solid/almost completely solid (2)  Echogenicity: isoechoic (1)  *Given size (>/= 1.5 - 2.4 cm) and appearance, a follow-up ultrasound in 1 year should be considered based on TI-RADS criteria.  _________________________________________________________   Remaining subcentimeter thyroid nodules do not meet criteria for  FNA or imaging surveillance.   IMPRESSION: Nodule 2 (TI-RADS 3) located in the inferior right thyroid lobe meets criteria for imaging follow-up.   Annual ultrasound surveillance is recommended until 5 years of stability is documented.  She also has IBS with constipation, hyperlipidemia, uterine leiomyoma.  She is retired Licensed conveyancer.  ROS: + See HPI  I reviewed pt's medications, allergies, PMH, social hx, family hx, and changes were documented in the history of present illness. Otherwise, unchanged from my initial visit note.  Past Medical History:  Diagnosis Date   Arthritis    cervical and lumbar   Cataract    Mixed OU   Costochondritis 08/27/2020   Depression    GERD (gastroesophageal reflux disease)    Hyperlipidemia    IBS (irritable bowel syndrome)    Osteoporosis    Prediabetes    Vitamin D deficiency    Past Surgical History:  Procedure Laterality Date   CARDIAC CATHETERIZATION  2011   normal (Dr. Terrence Dupont)   RADIOACTIVE SEED GUIDED EXCISIONAL BREAST BIOPSY Right 03/11/2016   Procedure: RIGHT RADIOACTIVE SEED GUIDED EXCISIONAL BREAST BIOPSY;  Surgeon: Rolm Bookbinder, MD;  Location: [facility];  Service: General;  Laterality: Right;   Social History   Socioeconomic History   Marital status: Married    Spouse name: Not on file   Number of children: 0   Years of education: Not on file   Highest education level: Not on file  Occupational History   Occupation: retired Copywriter, advertising  Tobacco Use   Smoking status: Never    Passive exposure: Never   Smokeless tobacco: Never  Vaping Use   Vaping Use: Never used  Substance and Sexual Activity   Alcohol use: Not Currently   Drug use: No   Sexual activity: Yes    Birth control/protection: Post-menopausal    Comment: 1st intercourse 67 yo-5 partners  Other Topics Concern   Not on file  Social History Narrative   Lives at home with spouse   Right handed   Caffeine: not much, drinks  decaf 2 cups/day   Social Determinants of Health   Financial Resource Strain: Not on file  Food Insecurity: Not on file  Transportation Needs: Not on file  Physical Activity: Not on file  Stress: Not on file  Social Connections: Not on file  Intimate Partner Violence: Not on file   Current Outpatient Medications on File Prior to Visit  Medication Sig Dispense Refill   Acetaminophen (TYLENOL 8 HOUR PO) Take by mouth.     aspirin 81 MG tablet Take 81 mg by mouth daily.     Bacillus Coagulans-Inulin (ALIGN PREBIOTIC-PROBIOTIC PO) Take 1 tablet by mouth daily.     benzonatate (TESSALON PERLES) 100 MG capsule Take 1 capsule (100 mg total) by mouth every 6 (six) hours as needed for cough. 30 capsule  1   Cholecalciferol 125 MCG (5000 UT) capsule Take 5,000 Units by mouth daily. Taking 4,000 units     ezetimibe (ZETIA) 10 MG tablet Take 1 tablet Dail for Cholesterol 90 tablet 3   Fluticasone Propionate (FLONASE ALLERGY RELIEF NA) Place 2 sprays into the nose as needed.     Hyoscyamine Sulfate (HYOSCYAMINE PO) Take by mouth as needed.     loratadine (CLARITIN) 10 MG tablet Take 1 tablet (10 mg total) by mouth 2 (two) times daily as needed for allergies (Can take an extra dose during flare ups.). Take 1 tablet 1-2 times daily 60 tablet 11   Lutein 20 MG TABS Take by mouth.     Magnesium 250 MG TABS      Multiple Vitamin (MULTIVITAMIN) tablet Take 1 tablet by mouth daily.     nitroGLYCERIN (NITROSTAT) 0.4 MG SL tablet Place 0.4 mg under the tongue every 5 (five) minutes as needed for chest pain.     Polyethylene Glycol 3350 (MIRALAX PO) Take by mouth at bedtime.     promethazine-dextromethorphan (PROMETHAZINE-DM) 6.25-15 MG/5ML syrup Take 5 mLs by mouth 4 (four) times daily as needed for cough. 240 mL 1   RESTASIS 0.05 % ophthalmic emulsion instill 1 drop into both eyes twice a day  0   vitamin B-12 (CYANOCOBALAMIN) 1000 MCG tablet Take 1,000 mcg by mouth 3 (three) times a week.     No current  facility-administered medications on file prior to visit.   Allergies  Allergen Reactions   Bactrim [Sulfamethoxazole-Trimethoprim] Other (See Comments)    Patient preference to take medication without sulfa   Cephalexin Nausea And Vomiting    Rash; chest pain Rash; chest pain   Sulfa Antibiotics    Family History  Problem Relation Age of Onset   Hypertension Mother    Other Mother        surgeries involving upper back   Diabetes Father    Heart failure Father    Cancer Maternal Grandmother        Lung   Leukemia Maternal Aunt    Allergic rhinitis Neg Hx    Angioedema Neg Hx    Asthma Neg Hx    Atopy Neg Hx    Eczema Neg Hx    Immunodeficiency Neg Hx    Urticaria Neg Hx    Migraines Neg Hx    PE: BP 128/70 (BP Location: Right Arm, Patient Position: Sitting, Cuff Size: Normal)   Pulse 88   Ht 5' 4"$  (1.626 m)   Wt 145 lb 3.2 oz (65.9 kg)   SpO2 99%   BMI 24.92 kg/m  Wt Readings from Last 3 Encounters:  07/23/22 145 lb 3.2 oz (65.9 kg)  07/21/22 144 lb (65.3 kg)  05/15/22 143 lb 6.4 oz (65 kg)   Constitutional: normal weight, in NAD Eyes:  EOMI, no exophthalmos ENT: no neck masses, no cervical lymphadenopathy Cardiovascular: RRR, No MRG Respiratory: CTA B Musculoskeletal: no deformities Skin:no rashes Neurological: no tremor with outstretched hands  Assessment: 1. Osteoporosis  2.  Right thyroid nodule  Plan: 1. Osteoporosis -Likely postmenopausal/age-related; no family history of osteoporosis -At previous visits, I recommended a good amount of protein in her diet, approximately 50 g/day for her weight, and also, I recommended a low acid diet, less than 2 drinks of alcohol a day and no smoking. -At today's visit we again discussed about weightbearing exercises and I recommended to do these approximately 5 out of 7 days.  At last visit she  was only walking. -We reviewed the latest 3 bone density scan reports.  Based on the T-scores, she is at an increased  risk for fractures.  The risk is higher risk in the spine.  However, her T-scores have improved at the spine level between 2019 and 2021 and they were stable at the hips.  The latest bone density was from 03/05/2022 and it showed that, at the level of the spine, T-scores improved slightly, from -3.3 to -3.1.  However, it still remains low. -At last visit and again today we reviewed different options for treatment.  As she does have acid reflux, we should avoid oral medications.  First treatment options for her would be Prolia or Reclast.  I previously gave her reading information about the 2 medications and we discussed about possible side effects, including atypical fractures or ONJ.  However, she prefers to manage her osteoporosis without medication. She did previously have an episode of joint pain/TMJ and I explained that this was unlikely to be related to the 1 dose of oral bisphosphonate that she took in 04/2020.  She appears to be open to try Reclast.  However, I recommended to start this after her sciatica pain resolves.  I advised her to contact me within the next month after the pain is better and I will order the Reclast to be done at the outpatient infusion center. -Says she was interested in nonmedication treatment for osteoporosis, I suggested [facility] for skeletal loading but she did not try this  -At last visit, vitamin D level was at the upper limit of the target range and I advised her to decrease her vitamin D supplement from 4000 to 3000 units daily.  She forgot to do so.  However, a repeat vitamin D level was normal and lower when checked in 03/2022 so we will continue the above dose now. -Her most recent calcium and kidney function were normal in 04/2022.   -I will see her back in a year  2.  Right thyroid nodule -Previous investigation was per PCP -In 05/2021, a thyroid ultrasound showed an inferior thyroid nodule, which was isoechoic, and measuring 2.1 x 1.6 x 1.4  cm -Recommendation was to follow-up with another ultrasound in a year.  Will check this now -No neck compression symptoms  Orders Placed This Encounter  Procedures   US THYROID   Thyroid U/S (07/30/2022): Parenchymal Echotexture: Mildly heterogenous  Isthmus: 0.3 cm  Right lobe: 4.8 x 1.9 x 2.4 cm  Left lobe: 3.9 x 1.4 x 1.5 cm  _________________________________________________________   Estimated total number of nodules >/= 1 cm: 1 _________________________________________________________   Nodule # 1:  Prior biopsy: No  Location: Right; inferior  Maximum size: 2.2 cm; Other 2 dimensions: 1.7 x 1.5 cm, previously, 2.1 x 1.6 x 1.4 cm  Composition: solid/almost completely solid (2)  Echogenicity: isoechoic (1) Margins: extra-thyroidal extension (3) Change in features: Yes Change in ACR TI-RADS risk category: Yes **Given size (>/= 1.5 cm) and appearance, fine needle aspiration of this moderately suspicious nodule should be considered based on TI-RADS criteria.  _________________________________________________________   Predominantly cystic 6 mm left inferior thyroid nodule does not meet criteria for imaging surveillance or FNA.   IMPRESSION: Nodule 1 (TI-RADS 4), measuring 2.2 cm, located in the inferior right thyroid lobe appears to extend outside of the posterior thyroid capsule and therefore now meets criteria for FNA.    Will suggest biopsy of the right thyroid nodule.  Philemon Kingdom, MD PhD [facility] Endocrinology

## 2022-07-23 NOTE — Patient Instructions (Addendum)
Please let me know when you are ready to get the Reclast infusion.  We will need a new thyroid U/S.  You should have an endocrinology follow-up appointment in 1 year.

## 2022-07-30 ENCOUNTER — Ambulatory Visit
Admission: RE | Admit: 2022-07-30 | Discharge: 2022-07-30 | Disposition: A | Discharge status: Home / Self Care | Payer: Medicare Other | Source: Ambulatory Visit | Attending: Internal Medicine | Admitting: Internal Medicine

## 2022-07-30 DIAGNOSIS — E041 Nontoxic single thyroid nodule: Secondary | ICD-10-CM

## 2022-07-31 ENCOUNTER — Ambulatory Visit: Payer: Medicare Other | Admitting: Obstetrics and Gynecology

## 2022-07-31 ENCOUNTER — Encounter: Payer: Self-pay | Admitting: Obstetrics and Gynecology

## 2022-07-31 VITALS — BP 120/70 | HR 67 | Wt 144.0 lb

## 2022-07-31 DIAGNOSIS — N952 Postmenopausal atrophic vaginitis: Secondary | ICD-10-CM

## 2022-07-31 DIAGNOSIS — N930 Postcoital and contact bleeding: Secondary | ICD-10-CM

## 2022-07-31 NOTE — Patient Instructions (Signed)
I would recommend the vaginal estrogen tablet for vaginal atrophy.   Atrophic Vaginitis  Atrophic vaginitis is a condition in which the tissues that line the vagina become dry and thin. This condition is most common in women who have stopped having regular menstrual periods (are in menopause). This usually starts when a woman is 70 to 71 years old. That is the time when a woman's estrogen levels begin to decrease. Estrogen is a female hormone. It helps to keep the tissues of the vagina moist. It stimulates the vagina to produce a clear fluid that lubricates the vagina for sex. This fluid also protects the vagina from infection. Lack of estrogen can cause the lining of the vagina to get thinner and dryer. The vagina may also shrink in size. It may become less elastic. Atrophic vaginitis tends to get worse over time as a woman's estrogen level drops. What are the causes? This condition is caused by the normal drop in estrogen that happens around the time of menopause. What increases the risk? Certain conditions or situations may lower a woman's estrogen level, leading to a higher risk for atrophic vaginitis. You are more likely to develop this condition if: You are taking medicines that block estrogen. You have had your ovaries removed. You are being treated for cancer with radiation or medicines (chemotherapy). You have given birth or are breastfeeding. You are older than age 48. You smoke. What are the signs or symptoms? Symptoms of this condition include: Pain, soreness, a feeling of pressure, or bleeding during sex (dyspareunia). Vaginal burning, irritation, or itching. Pain or bleeding when a speculum is used in a vaginal exam. Having burning pain while urinating. Vaginal discharge. In some cases, there are no symptoms. How is this diagnosed? This condition is diagnosed based on your medical history and a physical exam. This will include a pelvic exam that checks the vaginal tissues.  Though rare, you may also have other tests, including: A urine test. A test that checks the acid balance in your vagina (acid balance test). How is this treated? Treatment for this condition depends on how severe your symptoms are. Treatment may include: Using an over-the-counter vaginal lubricant before sex. Using a long-acting vaginal moisturizer. Using low-dose estrogen for moderate to severe symptoms that do not respond to other treatments. Options include creams, tablets, and inserts (vaginal rings). Before you use a vaginal estrogen, tell your health care provider if you have a history of: Breast cancer. Endometrial cancer. Blood clots. If you are not sexually active and your symptoms are very mild, you may not need treatment. Follow these instructions at home: Medicines Take over-the-counter and prescription medicines only as told by your health care provider. Do not use herbal or alternative medicines unless your health care provider says that you can. Use over-the-counter creams, lubricants, or moisturizers for dryness only as told by your health care provider. General instructions If your atrophic vaginitis is caused by menopause, discuss all of your menopause symptoms and treatment options with your health care provider. Do not douche. Do not use products that can make your vagina dry. These include: Scented feminine sprays. Scented tampons. Scented soaps. Vaginal sex can help to improve blood flow and elasticity of vaginal tissue. If you choose to have sex and it hurts, try using a water-soluble lubricant or moisturizer right before having sex. Contact a health care provider if: Your discharge looks different than normal. Your vagina has an unusual smell. You have new symptoms. Your symptoms do not improve  with treatment. Your symptoms get worse. Summary Atrophic vaginitis is a condition in which the tissues that line the vagina become dry and thin. It is most common in  women who have stopped having regular menstrual periods (are in menopause). Treatment options include using vaginal lubricants and low-dose vaginal estrogen. Contact a health care provider if your vagina has an unusual smell, or if your symptoms get worse or do not improve after treatment. This information is not intended to replace advice given to you by your health care provider. Make sure you discuss any questions you have with your health care provider. Document Revised: 12/01/2019 Document Reviewed: 12/01/2019 Elsevier Patient Education  [place].

## 2022-07-31 NOTE — Progress Notes (Signed)
GYNECOLOGY  VISIT   HPI: 72 y.o.   Married Black or [place] American Not Hispanic or Latino  female   [id number] with No LMP recorded. Patient is postmenopausal.   here for pink discharge after intercourse.  She states that she noticed it last Thursday. They had been sexually active 9 days ago, no pain or bleeding. They were sexually active 7 days ago, felt uncomfortable in her vagina, then noticed slight brown/pink discharge afterwards. They did use a lubricant.   She was seen in the fall for postcoital spotting.  9/23 ultrasound showed several small stable fibroids, she also had some endometrial fluid which created a natural sonohysterogram. Her stripe measured 1.4 mm.    03/19/22 Endometrial biopsy showed benign inactive atrophic endometrium.   GYNECOLOGIC HISTORY: No LMP recorded. Patient is postmenopausal. 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   Upper airway cough syndrome 05/04/2019   Pain in right knee 12/27/2018   Bigeminy 03/25/2018   Osteoporosis 08/11/2017   Abnormal glucose 06/25/2017   Medication management 12/12/2014   Labile hypertension 08/09/2014   GERD (gastroesophageal reflux disease)    Vitamin D deficiency    Hyperlipidemia, mixed     Past Medical History:  Diagnosis Date   Arthritis    cervical and lumbar   Cataract    Mixed OU   Costochondritis 08/27/2020   Depression    GERD (gastroesophageal reflux disease)    Hyperlipidemia    IBS (irritable bowel  syndrome)    Osteoporosis    Prediabetes    Vitamin D deficiency     Past Surgical History:  Procedure Laterality Date   CARDIAC CATHETERIZATION  2011   normal (Dr. Terrence Dupont)   RADIOACTIVE SEED GUIDED EXCISIONAL BREAST BIOPSY Right 03/11/2016   Procedure: RIGHT RADIOACTIVE SEED GUIDED EXCISIONAL BREAST BIOPSY;  Surgeon: Rolm Bookbinder, MD;  Location: [facility];  Service: General;  Laterality: Right;    Current Outpatient Medications  Medication Sig Dispense Refill   Acetaminophen (TYLENOL 8 HOUR PO) Take by mouth.     aspirin 81 MG tablet Take 81 mg by mouth daily.     Bacillus Coagulans-Inulin (ALIGN PREBIOTIC-PROBIOTIC PO) Take 1 tablet by mouth daily.     benzonatate (TESSALON PERLES) 100 MG capsule Take 1 capsule (100 mg total) by mouth every 6 (six) hours as needed for cough. 30 capsule 1   Cholecalciferol 125 MCG (5000 UT) capsule Take 5,000 Units by mouth daily. Taking 4,000 units     ezetimibe (ZETIA) 10 MG tablet Take 1 tablet Dail for Cholesterol 90 tablet 3   Fluticasone Propionate (FLONASE ALLERGY RELIEF NA) Place 2 sprays into the nose as needed.     Hyoscyamine Sulfate (HYOSCYAMINE PO) Take by mouth as needed.  loratadine (CLARITIN) 10 MG tablet Take 1 tablet (10 mg total) by mouth 2 (two) times daily as needed for allergies (Can take an extra dose during flare ups.). Take 1 tablet 1-2 times daily 60 tablet 11   Lutein 20 MG TABS Take by mouth.     Magnesium 250 MG TABS      Multiple Vitamin (MULTIVITAMIN) tablet Take 1 tablet by mouth daily.     nitroGLYCERIN (NITROSTAT) 0.4 MG SL tablet Place 0.4 mg under the tongue every 5 (five) minutes as needed for chest pain.     Polyethylene Glycol 3350 (MIRALAX PO) Take by mouth at bedtime.     RESTASIS 0.05 % ophthalmic emulsion instill 1 drop into both eyes twice a day  0   vitamin B-12 (CYANOCOBALAMIN) 1000 MCG tablet Take 1,000 mcg by mouth 3 (three) times a week.     promethazine-dextromethorphan  (PROMETHAZINE-DM) 6.25-15 MG/5ML syrup Take 5 mLs by mouth 4 (four) times daily as needed for cough. (Patient not taking: Reported on 07/31/2022) 240 mL 1   No current facility-administered medications for this visit.     ALLERGIES: Bactrim [sulfamethoxazole-trimethoprim], Cephalexin, and Sulfa antibiotics  Family History  Problem Relation Age of Onset   Hypertension Mother    Other Mother        surgeries involving upper back   Diabetes Father    Heart failure Father    Cancer Maternal Grandmother        Lung   Leukemia Maternal Aunt    Allergic rhinitis Neg Hx    Angioedema Neg Hx    Asthma Neg Hx    Atopy Neg Hx    Eczema Neg Hx    Immunodeficiency Neg Hx    Urticaria Neg Hx    Migraines Neg Hx     Social History   Socioeconomic History   Marital status: Married    Spouse name: Not on file   Number of children: 0   Years of education: Not on file   Highest education level: Not on file  Occupational History   Occupation: retired Copywriter, advertising  Tobacco Use   Smoking status: Never    Passive exposure: Never   Smokeless tobacco: Never  Vaping Use   Vaping Use: Never used  Substance and Sexual Activity   Alcohol use: Not Currently   Drug use: No   Sexual activity: Yes    Birth control/protection: Post-menopausal    Comment: 1st intercourse 42 yo-5 partners  Other Topics Concern   Not on file  Social History Narrative   Lives at home with spouse   Right handed   Caffeine: not much, drinks decaf 2 cups/day   Social Determinants of Health   Financial Resource Strain: Not on file  Food Insecurity: Not on file  Transportation Needs: Not on file  Physical Activity: Not on file  Stress: Not on file  Social Connections: Not on file  Intimate Partner Violence: Not on file    ROS  PHYSICAL EXAMINATION:    BP 120/70   Pulse 67   Wt 144 lb (65.3 kg)   SpO2 100%   BMI 24.72 kg/m     General appearance: alert, cooperative and appears stated  age  Pelvic: External genitalia:  no lesions              Urethra:  normal appearing urethra with no masses, tenderness or lesions              Bartholins and Skenes:  normal                 Vagina: atrophic appearing vagina with normal color and discharge, no lesions. Uncomfortable with a pederson speculum, adolescent speculum more comofortable.               Cervix: no lesions              Bimanual Exam:  Uterus:  normal size, contour, position, consistency, mobility, non-tender              Adnexa: no mass, fullness, tenderness                Chaperone was present for exam.  1. Postcoital bleeding Spotting after intercourse. Negative evaluation in the fall. I think her spotting is from vaginal atrophy and irritation from intercourse. She is using vaginal lubricant.  2. Vaginal atrophy Discussed the option of vaginal estrogen, including the small risks. She will reach out if she is interested.

## 2022-08-05 ENCOUNTER — Telehealth: Payer: Self-pay

## 2022-08-05 NOTE — Telephone Encounter (Signed)
Pt received message about Biopsy and had follow up questions. She was hoping for a call back from provider to know what all it entailed and who would perform it.

## 2022-08-06 ENCOUNTER — Encounter: Payer: Self-pay | Admitting: Internal Medicine

## 2022-08-07 ENCOUNTER — Other Ambulatory Visit: Payer: Self-pay | Admitting: Internal Medicine

## 2022-08-07 DIAGNOSIS — E041 Nontoxic single thyroid nodule: Secondary | ICD-10-CM

## 2022-08-12 ENCOUNTER — Encounter: Payer: Self-pay | Admitting: Nurse Practitioner

## 2022-08-12 ENCOUNTER — Ambulatory Visit: Payer: Medicare Other | Admitting: Nurse Practitioner

## 2022-08-12 VITALS — BP 116/70 | HR 57 | Temp 97.7°F | Ht 64.0 in | Wt 143.4 lb

## 2022-08-12 DIAGNOSIS — M25551 Pain in right hip: Secondary | ICD-10-CM

## 2022-08-12 DIAGNOSIS — M79604 Pain in right leg: Secondary | ICD-10-CM

## 2022-08-12 DIAGNOSIS — R0989 Other specified symptoms and signs involving the circulatory and respiratory systems: Secondary | ICD-10-CM

## 2022-08-12 DIAGNOSIS — M545 Low back pain, unspecified: Secondary | ICD-10-CM | POA: Diagnosis not present

## 2022-08-12 DIAGNOSIS — G8929 Other chronic pain: Secondary | ICD-10-CM | POA: Diagnosis not present

## 2022-08-12 NOTE — Patient Instructions (Signed)
Hip Pain The hip is the joint between the upper legs and the lower pelvis. The bones, cartilage, tendons, and muscles of your hip joint support your body and allow you to move around. Hip pain can range from a minor ache to severe pain in one or both of your hips. The pain may be felt on the inside of the hip joint near the groin, or on the outside near the buttocks and upper thigh. You may also have swelling or stiffness in your hip area. Follow these instructions at home: Managing pain, stiffness, and swelling     If told, put ice on the painful area. Put ice in a plastic bag. Place a towel between your skin and the bag. Leave the ice on for 20 minutes, 2-3 times a day. If told, apply heat to the affected area as often as told by your health care provider. Use the heat source that your provider recommends, such as a moist heat pack or a heating pad. Place a towel between your skin and the heat source. Leave the heat on for 20-30 minutes. If your skin turns bright red, remove the ice or heat right away to prevent skin damage. The risk of damage is higher if you cannot feel pain, heat, or cold. Activity Do exercises as told by your provider. Avoid activities that cause pain. General instructions  Take over-the-counter and prescription medicines only as told by your provider. Keep a journal of your symptoms. Write down: How often you have hip pain. The location of your pain. What the pain feels like. What makes the pain worse. Sleep with a pillow between your legs on your most comfortable side. Keep all follow-up visits. Your provider will monitor your pain and activity. Contact a health care provider if: You cannot put weight on your leg. Your pain or swelling gets worse after a week. It gets harder to walk. You have a fever. Get help right away if: You fall. You have a sudden increase in pain and swelling in your hip. Your hip is red or swollen or very tender to touch. This  information is not intended to replace advice given to you by your health care provider. Make sure you discuss any questions you have with your health care provider. Document Revised: 02/04/2022 Document Reviewed: 02/04/2022 Elsevier Patient Education  [place].

## 2022-08-12 NOTE — Progress Notes (Signed)
Assessment and Plan:  [name] was seen today for acute visit.  Diagnoses and all orders for this visit:  Chronic right hip pain/Low back pain radiating to right leg Has completed evaluation and PT at orthopedics They suggest next step is cortisone injection but she wants a neuro evaluation before making that decision Referral placed to neurology -     Ambulatory referral to Neurology  Labile hypertension - continue DASH diet, exercise and monitor at home. Call if greater than 130/80.         Further disposition pending results of labs. Discussed med's effects and SE's.   Over 30 minutes of exam, counseling, chart review, and critical decision making was performed.   Future Appointments  Date Time Provider [place]  08/21/2022  4:15 PM [facility] [place] 1 [id number] [facility] [address] [facility]  10/13/2022 10:30 AM Unk Pinto, MD [facility] None  04/09/2023 10:00 AM Unk Pinto, MD [facility] None  07/24/2023 10:00 AM Philemon Kingdom, MD [facility] None    ------------------------------------------------------------------------------------------------------------------   HPI BP 116/70   Pulse (!) 57   Temp 97.7 F (36.5 C)   Ht '5\' 4"'$  (1.626 m)   Wt 143 lb 6.4 oz (65 kg)   SpO2 98%   BMI 24.61 kg/m   72 y.o.female presents for request for neurology referral for pain in right hip that radiates down legs or starts low and radiates up. Also has some numbness in left mid calf that is intermittent.  Completed PT at [facility] and they suggested cortisone injections and possible future surgery.   BP is controlled without medication. Denies headaches, chest pain, shortness of breath and dizziness.  BP Readings from Last 3 Encounters:  08/12/22 116/70  07/31/22 120/70  07/23/22 128/70     Past Medical History:  Diagnosis Date   Arthritis    cervical and lumbar   Cataract    Mixed OU   Costochondritis 08/27/2020   Depression    GERD (gastroesophageal reflux disease)     Hyperlipidemia    IBS (irritable bowel syndrome)    Osteoporosis    Prediabetes    Vitamin D deficiency      Allergies  Allergen Reactions   Bactrim [Sulfamethoxazole-Trimethoprim] Other (See Comments)    Patient preference to take medication without sulfa   Cephalexin Nausea And Vomiting    Rash; chest pain Rash; chest pain   Sulfa Antibiotics     Current Outpatient Medications on File Prior to Visit  Medication Sig   Acetaminophen (TYLENOL 8 HOUR PO) Take by mouth.   alum & mag hydroxide-simeth (GERI-LANTA) 200-200-20 MG/5ML suspension Take by mouth.   aspirin 81 MG tablet Take 81 mg by mouth daily.   Bacillus Coagulans-Inulin (ALIGN PREBIOTIC-PROBIOTIC PO) Take 1 tablet by mouth daily.   Cholecalciferol 125 MCG (5000 UT) capsule Take 5,000 Units by mouth daily. Taking 4,000 units   ezetimibe (ZETIA) 10 MG tablet Take 1 tablet Dail for Cholesterol   Fluticasone Propionate (FLONASE ALLERGY RELIEF NA) Place 2 sprays into the nose as needed.   Hyoscyamine Sulfate (HYOSCYAMINE PO) Take by mouth as needed.   loratadine (CLARITIN) 10 MG tablet Take 1 tablet (10 mg total) by mouth 2 (two) times daily as needed for allergies (Can take an extra dose during flare ups.). Take 1 tablet 1-2 times daily   Lutein 20 MG TABS Take by mouth.   Magnesium 250 MG TABS    Multiple Vitamin (MULTIVITAMIN) tablet Take 1 tablet by mouth daily.   nitroGLYCERIN (NITROSTAT) 0.4 MG SL  tablet Place 0.4 mg under the tongue every 5 (five) minutes as needed for chest pain.   Polyethylene Glycol 3350 (MIRALAX PO) Take by mouth at bedtime.   RESTASIS 0.05 % ophthalmic emulsion instill 1 drop into both eyes twice a day   triamcinolone (NASACORT) 55 MCG/ACT AERO nasal inhaler Place into the nose.   vitamin B-12 (CYANOCOBALAMIN) 1000 MCG tablet Take 1,000 mcg by mouth 3 (three) times a week.   benzonatate (TESSALON PERLES) 100 MG capsule Take 1 capsule (100 mg total) by mouth every 6 (six) hours as needed for cough.  (Patient not taking: Reported on 08/12/2022)   promethazine-dextromethorphan (PROMETHAZINE-DM) 6.25-15 MG/5ML syrup Take 5 mLs by mouth 4 (four) times daily as needed for cough. (Patient not taking: Reported on 07/31/2022)   No current facility-administered medications on file prior to visit.    ROS: all negative except above.   Physical Exam:  BP 116/70   Pulse (!) 57   Temp 97.7 F (36.5 C)   Ht '5\' 4"'$  (1.626 m)   Wt 143 lb 6.4 oz (65 kg)   SpO2 98%   BMI 24.61 kg/m   General Appearance: Well nourished, in no apparent distress. Eyes: PERRLA, EOMs, conjunctiva no swelling or erythema Sinuses: No Frontal/maxillary tenderness ENT/Mouth: Ext aud canals clear, TMs without erythema, bulging. No erythema, swelling, or exudate on post pharynx.  Hearing normal.  Neck: Supple, thyroid normal.  Respiratory: Respiratory effort normal, BS equal bilaterally without rales, rhonchi, wheezing or stridor.  Cardio: RRR with no MRGs. Brisk peripheral pulses without edema.  Musculoskeletal: Full ROM, 5/5 strength, normal gait. Mild tenderness at right hip with moderate pressure. Negative leg raise. No pain with inter/external rotation. Left hip normal exam Skin: Warm, dry without rashes, lesions, ecchymosis.  Neuro: Cranial nerves intact. Normal muscle tone, no cerebellar symptoms.  Psych: Awake and oriented X 3, normal affect, Insight and Judgment appropriate.     Alycia Rossetti, NP 9:20 AM [facility]

## 2022-08-21 ENCOUNTER — Ambulatory Visit
Admission: RE | Admit: 2022-08-21 | Discharge: 2022-08-21 | Disposition: A | Discharge status: Home / Self Care | Payer: Medicare Other | Source: Ambulatory Visit | Attending: Internal Medicine | Admitting: Internal Medicine

## 2022-08-21 ENCOUNTER — Encounter: Payer: Self-pay | Admitting: Internal Medicine

## 2022-08-21 ENCOUNTER — Other Ambulatory Visit (HOSPITAL_COMMUNITY)
Admission: RE | Admit: 2022-08-21 | Discharge: 2022-08-21 | Disposition: A | Discharge status: Home / Self Care | Payer: Medicare Other | Source: Ambulatory Visit | Attending: Internal Medicine | Admitting: Internal Medicine

## 2022-08-21 DIAGNOSIS — E041 Nontoxic single thyroid nodule: Secondary | ICD-10-CM

## 2022-08-25 LAB — CYTOLOGY - NON PAP

## 2022-10-03 ENCOUNTER — Ambulatory Visit: Payer: Medicare Other | Admitting: Nurse Practitioner

## 2022-10-12 ENCOUNTER — Encounter: Payer: Self-pay | Admitting: Internal Medicine

## 2022-10-12 NOTE — Progress Notes (Unsigned)
Future Appointments  Date Time Provider Department  10/13/2022 10:30 AM Lucky Cowboy, MD [facility]  10/29/2022  9:00 AM Anson Fret, MD [facility]  04/09/2023 10:00 AM Lucky Cowboy, MD [facility]  07/24/2023 10:00 AM Carlus Pavlov, MD [facility]    History of Present Illness:       This very nice 72 y.o. MBF  presents for 6 month follow up with HTN, HLD, Pre-Diabetes and Vitamin D Deficiency.         Patient is followed expectantly  for labile HTN   & BP has been controlled . Today's  .   In  2011,  she had a Nl heart echo & Heart cath (Dr Sharyn Lull).  In Mar 2022 she had a Negative Myoview  - Dr Sharyn Lull. Patient has had no complaints of any cardiac type chest pain, palpitations, dyspnea Pollyann Kennedy /PND, dizziness, claudication or dependent edema.        Hyperlipidemia is controlled with diet & meds. Patient denies myalgias or other med SE's. Last Lipids were near goal :  Lab Results  Component Value Date   CHOL 196 04/03/2022   HDL 81 04/03/2022   LDLCALC 101 (H) 04/03/2022   TRIG 49 04/03/2022   CHOLHDL 2.4 04/03/2022     Also, the patient has history of  PreDiabetes  (A1c 5.7% /2014 & 6.1% /2016)  and has had no symptoms of reactive hypoglycemia, diabetic polys, paresthesias or visual blurring.  Last A1c was near goal :  Lab Results  Component Value Date   HGBA1C 5.9 (H) 04/03/2022                                                         Further, the patient also has history of Vitamin D Deficiency and supplements vitamin D . Last vitamin D was  Lab Results  Component Value Date   VD25OH 47 04/03/2022     Current Outpatient Medications on File Prior to Visit  Medication Sig   Acetaminophen (TYLENOL 8 HR ) Take    alum & mag hydroxide-simeth (GERI-LANTA) 200-200-20 MG/5ML suspension Take    aspirin 81 MG tablet Take  daily.   Bacillus Coagulans-Inulin (ALIGN PREBIOTIC-PROBIOTIC  Take  daily.   Cholecalciferol 5000 u Taking 4,000 units   ezetimibe  (ZETIA) 10 MG tablet Take 1 tablet Daily for Cholesterol   FLONASE Place 2 sprays into the nose as needed.   Hyoscyamine Sulfate (HYOSCYAMINE PO) Take  as needed.   loratadine (CLARITIN) 10 MG tablet Take 1 tablet 1-2 times daily   Lutein 20 MG TABS Take by mouth.   Magnesium 250 MG TABS    Multiple Vitamin (MULTIVITAMIN) tablet Take 1 tablet by mouth daily.   nitroGLYCERIN (NITROSTAT) 0.4 MG SL tablet Place 0.4 mg under the tongue every 5 (five) minutes as needed for chest pain.   Polyethylene Glycol 3350 (MIRALAX PO) Take by mouth at bedtime.   RESTASIS 0.05 % ophthalmic emulsion instill 1 drop into both eyes twice a day   triamcinolone (NASACORT) 55 MCG/ACT AERO nasal inhaler Place into the nose.   vitamin B-12 1000 MCG  Take 1,000 mcg by mouth 3 (three) times a week.     Allergies  Allergen Reactions   Bactrim [Sulfamethoxazole-Trimethoprim] Other (See Comments)    Patient preference to take medication without sulfa  Cephalexin Nausea And Vomiting    Rash; chest pain Rash; chest pain   Sulfa Antibiotics      PMHx:   Past Medical History:  Diagnosis Date   Arthritis    cervical and lumbar   Cataract    Mixed OU   Costochondritis 08/27/2020   Depression    GERD (gastroesophageal reflux disease)    Hyperlipidemia    IBS (irritable bowel syndrome)    Osteoporosis    Prediabetes    Vitamin D deficiency       Immunization History  Administered Date(s) Administered   Influenza, High Dose Seasonal PF 06/25/2017   Influenza-Unspecified 03/24/2018, 03/29/2019, 03/05/2020   PFIZER Comirnaty(Gray Top)Covid-19 Tri-Sucrose Vaccine 12/26/2020   PFIZER(Purple Top)SARS-COV-2 Vaccination 07/08/2019, 07/29/2019, 03/20/2020   PPD Test 12/12/2014, 12/26/2015, 01/15/2017   Pneumococcal Conjugate-13 11/10/2018   Pneumococcal Polysaccharide-23 10/02/2021   Td 05/18/2007   Zoster, Live 05/17/2006      Past Surgical History:  Procedure Laterality Date   CARDIAC CATHETERIZATION   2011   normal (Dr. Sharyn Lull)   RADIOACTIVE SEED GUIDED EXCISIONAL BREAST BIOPSY Right 03/11/2016   Procedure: RIGHT RADIOACTIVE SEED GUIDED EXCISIONAL BREAST BIOPSY;  Surgeon: Emelia Loron, MD;  Location: [facility];  Service: General;  Laterality: Right;     FHx:    Reviewed / unchanged   SHx:    Reviewed / unchanged    Systems Review:  Constitutional: Denies fever, chills, wt changes, headaches, insomnia, fatigue, night sweats, change in appetite. Eyes: Denies redness, blurred vision, diplopia, discharge, itchy, watery eyes.  ENT: Denies discharge, congestion, post nasal drip, epistaxis, sore throat, earache, hearing loss, dental pain, tinnitus, vertigo, sinus pain, snoring.  CV: Denies chest pain, palpitations, irregular heartbeat, syncope, dyspnea, diaphoresis, orthopnea, PND, claudication or edema. Respiratory: denies cough, dyspnea, DOE, pleurisy, hoarseness, laryngitis, wheezing.  Gastrointestinal: Denies dysphagia, odynophagia, heartburn, reflux, water brash, abdominal pain or cramps, nausea, vomiting, bloating, diarrhea, constipation, hematemesis, melena, hematochezia  or hemorrhoids. Genitourinary: Denies dysuria, frequency, urgency, nocturia, hesitancy, discharge, hematuria or flank pain. Musculoskeletal: Denies arthralgias, myalgias, stiffness, jt. swelling, pain, limping or strain/sprain.  Skin: Denies pruritus, rash, hives, warts, acne, eczema or change in skin lesion(s). Neuro: No weakness, tremor, incoordination, spasms, paresthesia or pain. Psychiatric: Denies confusion, memory loss or sensory loss. Endo: Denies change in weight, skin or hair change.  Heme/Lymph: No excessive bleeding, bruising or enlarged lymph nodes.   Physical Exam  There were no vitals taken for this visit.  Appears  well nourished, well groomed  and in no distress.  Eyes: PERRLA, EOMs, conjunctiva no swelling or erythema. Sinuses: No frontal/maxillary  tenderness ENT/Mouth: EAC's clear, TM's nl w/o erythema, bulging. Nares clear w/o erythema, swelling, exudates. Oropharynx clear without erythema or exudates. Oral hygiene is good. Tongue normal, non obstructing. Hearing intact.  Neck: Supple. Thyroid not palpable. Car 2+/2+ without bruits, nodes or JVD. Chest: Respirations nl with BS clear & equal w/o rales, rhonchi, wheezing or stridor.  Cor: Heart sounds normal w/ regular rate and rhythm without sig. murmurs, gallops, clicks or rubs. Peripheral pulses normal and equal  without edema.  Abdomen: Soft & bowel sounds normal. Non-tender w/o guarding, rebound, hernias, masses or organomegaly.  Lymphatics: Unremarkable.  Musculoskeletal: Full ROM all peripheral extremities, joint stability, 5/5 strength and normal gait.  Skin: Warm, dry without exposed rashes, lesions or ecchymosis apparent.  Neuro: Cranial nerves intact, reflexes equal bilaterally. Sensory-motor testing grossly intact. Tendon reflexes grossly intact.  Pysch: Alert & oriented x 3.  Insight and judgement  nl & appropriate. No ideations.   Assessment and Plan:  1. Labile hypertension  - Continue medication, monitor blood pressure at home.  - Continue DASH diet.  Reminder to go to the ER if any CP,  SOB, nausea, dizziness, severe HA, changes vision/speech.   - CBC with Differential/Platelet - COMPLETE METABOLIC PANEL WITH GFR - Magnesium  2. Hyperlipidemia, mixed  - Continue diet/meds, exercise,& lifestyle modifications.  - Continue monitor periodic cholesterol/liver & renal functions    - Lipid panel - TSH  3. Abnormal glucose  - Continue diet, exercise  - Lifestyle modifications.  - Monitor appropriate labs   - Hemoglobin A1c - Insulin, random  4. Vitamin D deficiency  - Continue supplementation   - VITAMIN D 25 Hydroxy   5. Medication management [Z79.899]  - CBC with Differential/Platelet - COMPLETE METABOLIC PANEL WITH GFR - Magnesium - Lipid panel -  TSH - Hemoglobin A1c - Insulin, random - VITAMIN D 25 Hydroxy          Discussed  regular exercise, BP monitoring, weight control to achieve/maintain BMI less than 25 and discussed med and SE's. Recommended labs to assess /monitor clinical status .  I discussed the assessment and treatment plan with the patient. The patient was provided an opportunity to ask questions and all were answered. The patient agreed with the plan and demonstrated an understanding of the instructions.  I provided over 30 minutes of exam, counseling, chart review and  complex critical decision making.        The patient was advised to call back or seek an in-person evaluation if the symptoms worsen or if the condition fails to improve as anticipated.   Marinus Maw, MD

## 2022-10-12 NOTE — Patient Instructions (Signed)

## 2022-10-13 ENCOUNTER — Ambulatory Visit: Payer: Medicare Other | Admitting: Internal Medicine

## 2022-10-13 ENCOUNTER — Encounter: Payer: Self-pay | Admitting: Internal Medicine

## 2022-10-13 VITALS — BP 102/60 | HR 70 | Temp 97.3°F | Resp 16 | Ht 65.0 in | Wt 141.4 lb

## 2022-10-13 DIAGNOSIS — R0989 Other specified symptoms and signs involving the circulatory and respiratory systems: Secondary | ICD-10-CM

## 2022-10-13 DIAGNOSIS — E782 Mixed hyperlipidemia: Secondary | ICD-10-CM | POA: Diagnosis not present

## 2022-10-13 DIAGNOSIS — Z79899 Other long term (current) drug therapy: Secondary | ICD-10-CM

## 2022-10-13 DIAGNOSIS — E559 Vitamin D deficiency, unspecified: Secondary | ICD-10-CM

## 2022-10-13 DIAGNOSIS — R7309 Other abnormal glucose: Secondary | ICD-10-CM

## 2022-10-14 LAB — COMPLETE METABOLIC PANEL WITH GFR
AG Ratio: 1.3 (calc) (ref 1.0–2.5)
ALT: 17 U/L (ref 6–29)
AST: 21 U/L (ref 10–35)
Albumin: 4 g/dL (ref 3.6–5.1)
Alkaline phosphatase (APISO): 79 U/L (ref 37–153)
BUN: 16 mg/dL (ref 7–25)
CO2: 28 mmol/L (ref 20–32)
Calcium: 9.6 mg/dL (ref 8.6–10.4)
Chloride: 104 mmol/L (ref 98–110)
Creat: 0.74 mg/dL (ref 0.60–1.00)
Globulin: 3 g/dL (calc) (ref 1.9–3.7)
Glucose, Bld: 88 mg/dL (ref 65–99)
Potassium: 4.5 mmol/L (ref 3.5–5.3)
Sodium: 140 mmol/L (ref 135–146)
Total Bilirubin: 0.5 mg/dL (ref 0.2–1.2)
Total Protein: 7 g/dL (ref 6.1–8.1)
eGFR: 87 mL/min/{1.73_m2} (ref >=60)

## 2022-10-14 LAB — LIPID PANEL
Cholesterol: 221 mg/dL — ABNORMAL HIGH (ref <200)
HDL: 90 mg/dL (ref >=50)
LDL Cholesterol (Calc): 115 mg/dL (calc) — ABNORMAL HIGH
Non-HDL Cholesterol (Calc): 131 mg/dL (calc) — ABNORMAL HIGH (ref <130)
Total CHOL/HDL Ratio: 2.5 (calc) (ref <5.0)
Triglycerides: 68 mg/dL (ref <150)

## 2022-10-14 LAB — CBC WITH DIFFERENTIAL/PLATELET
Absolute Monocytes: 184 cells/uL — ABNORMAL LOW (ref 200–950)
Basophils Absolute: 21 cells/uL (ref 0–200)
Basophils Relative: 0.9 %
Eosinophils Absolute: 71 cells/uL (ref 15–500)
Eosinophils Relative: 3.1 %
HCT: 37.2 % (ref 35.0–45.0)
Hemoglobin: 12.6 g/dL (ref 11.7–15.5)
Lymphs Abs: 1323 cells/uL (ref 850–3900)
MCH: 28.7 pg (ref 27.0–33.0)
MCHC: 33.9 g/dL (ref 32.0–36.0)
MCV: 84.7 fL (ref 80.0–100.0)
MPV: 11.8 fL (ref 7.5–12.5)
Monocytes Relative: 8 %
Neutro Abs: 702 cells/uL — ABNORMAL LOW (ref 1500–7800)
Neutrophils Relative %: 30.5 %
Platelets: 166 10*3/uL (ref 140–400)
RBC: 4.39 10*6/uL (ref 3.80–5.10)
RDW: 13.4 % (ref 11.0–15.0)
Total Lymphocyte: 57.5 %
WBC: 2.3 10*3/uL — ABNORMAL LOW (ref 3.8–10.8)

## 2022-10-14 LAB — MAGNESIUM: Magnesium: 2.2 mg/dL (ref 1.5–2.5)

## 2022-10-14 LAB — TSH: TSH: 1.17 mIU/L (ref 0.40–4.50)

## 2022-10-14 LAB — HEMOGLOBIN A1C
Hgb A1c MFr Bld: 5.8 % of total Hgb — ABNORMAL HIGH (ref <5.7)
Mean Plasma Glucose: 120 mg/dL
eAG (mmol/L): 6.6 mmol/L

## 2022-10-14 LAB — INSULIN, RANDOM: Insulin: 4.7 u[IU]/mL

## 2022-10-14 LAB — VITAMIN D 25 HYDROXY (VIT D DEFICIENCY, FRACTURES): Vit D, 25-Hydroxy: 74 ng/mL (ref 30–100)

## 2022-10-14 NOTE — Progress Notes (Signed)
^<^<^<^<^<^<^<^<^<^<^<^<^<^<^<^<^<^<^<^<^<^<^<^<^<^<^<^<^<^<^<^<^<^<^<^<^ ^>^>^>^>^>^>^>^>^>^>^>>^>^>^>^>^>^>^>^>^>^>^>^>^>^>^>^>^>^>^>^>^>^>^>^>^>  -Test results slightly outside the reference range are not unusual. If there is anything important, I will review this with you,  otherwise it is considered normal test values.  If you have further questions,  please do not hesitate to contact me at the office or via My Chart.   ^<^<^<^<^<^<^<^<^<^<^<^<^<^<^<^<^<^<^<^<^<^<^<^<^<^<^<^<^<^<^<^<^<^<^<^<^ ^>^>^>^>^>^>^>^>^>^>^>^>^>^>^>^>^>^>^>^>^>^>^>^>^>^>^>^>^>^>^>^>^>^>^>^>^  -   - Total Chol = 221    is very high risk for Heart Attack /Stroke /Vascular Dementia     ( Ideal or Goal is less than 180 ! )  & - Bad /Dangerous LDL Chol =  115  - - >> Sitting on a time Bomb !     ( Ideal or Goal is less than 70 ! )   -  Please continue your Zetia   - And your Diet is very Important  !    - Read or listen to   Dr Glenard Haring 's book    " How Not to Die ! "    - Recommend a stricter plant based low cholesterol diet   - Cholesterol only comes from animal sources                                                                 - ie. meat, dairy, egg yolks  - Eat all the vegetables you want.  - Avoid Meat, Avoid Meat , Avoid Meat  ! ! !                                                  -especially red meat - Beef AND Pork  - Avoid cheese & dairy - milk & ice cream.   - Cheese is the most concentrated form of trans-fats which                                                    is the worst thing to clog up our arteries.  - Cholesterol is too high - Recommend low cholesterol diet   - Cholesterol only comes from animal sources  - ie. meat, dairy, egg yolks  - Eat all the vegetables you want.  - Avoid Meat, Avoid Meat,  Avoid Meat - especially Red Meat - Beef AND Pork .  - Avoid cheese & dairy - milk & ice cream.     - Cheese is the most concentrated form  of trans-fats which  is the worst thing to clog up our arteries.   - Veggie cheese is OK which can be found in the fresh  produce section at [employer] or [employer] or [employer]  ^<^<^<^<^<^<^<^<^<^<^<^<^<^<^<^<^<^<^<^<^<^<^<^<^<^<^<^<^<^<^<^<^<^<^<^<^ ^>^>^>^>^>^>^>^>^>^>^>^>^>^>^>^>^>^>^>^>^>^>^>^>^>^>^>^>^>^>^>^>^>^>^>^>^  -  A1c = 5.8% Blood sugar and A1c are STILL                                             elevated in the borderline and  early or pre-diabetes range which has the same   300% increased risk for heart attack, stroke, cancer and                                           alzheimer- type vascular dementia as full blown diabetes.   But the good news is that diet, exercise with                                                    weight loss can cure the early diabetes at this point.  ^<^<^<^<^<^<^<^<^<^<^<^<^<^<^<^<^<^<^<^<^<^<^<^<^<^<^<^<^<^<^<^<^<^<^<^<^ ^>^>^>^>^>^>^>^>^>^>^>^>^>^>^>^>^>^>^>^>^>^>^>^>^>^>^>^>^>^>^>^>^>^>^>^>^  - Vitamin D =74 - Great - Please keep dosage same   ^<^<^<^<^<^<^<^<^<^<^<^<^<^<^<^<^<^<^<^<^<^<^<^<^<^<^<^<^<^<^<^<^<^<^<^<^ ^>^>^>^>^>^>^>^>^>^>^>^>^>^>^>^>^>^>^>^>^>^>^>^>^>^>^>^>^>^>^>^>^>^>^>^>^  -  All Else - CBC - Kidneys - Electrolytes - Liver - Magnesium & Thyroid    - all  Normal / OK  ^<^<^<^<^<^<^<^<^<^<^<^<^<^<^<^<^<^<^<^<^<^<^<^<^<^<^<^<^<^<^<^<^<^<^<^<^ ^>^>^>^>^>^>^>^>^>^>^>^>^>^>^>^>^>^>^>^>^>^>^>^>^>^>^>^>^>^>^>^>^>^>^>^>^

## 2022-10-29 ENCOUNTER — Institutional Professional Consult (permissible substitution): Payer: Medicare Other | Admitting: Neurology

## 2022-11-03 ENCOUNTER — Ambulatory Visit: Payer: Medicare Other | Admitting: Neurology

## 2022-11-03 ENCOUNTER — Encounter: Payer: Self-pay | Admitting: Neurology

## 2022-11-03 ENCOUNTER — Telehealth: Payer: Self-pay | Admitting: Neurology

## 2022-11-03 VITALS — BP 92/52 | HR 53 | Ht 64.0 in | Wt 142.0 lb

## 2022-11-03 DIAGNOSIS — Z9181 History of falling: Secondary | ICD-10-CM

## 2022-11-03 DIAGNOSIS — M549 Dorsalgia, unspecified: Secondary | ICD-10-CM

## 2022-11-03 DIAGNOSIS — R200 Anesthesia of skin: Secondary | ICD-10-CM | POA: Diagnosis not present

## 2022-11-03 DIAGNOSIS — R29898 Other symptoms and signs involving the musculoskeletal system: Secondary | ICD-10-CM | POA: Diagnosis not present

## 2022-11-03 DIAGNOSIS — R29818 Other symptoms and signs involving the nervous system: Secondary | ICD-10-CM

## 2022-11-03 DIAGNOSIS — M5416 Radiculopathy, lumbar region: Secondary | ICD-10-CM | POA: Diagnosis not present

## 2022-11-03 DIAGNOSIS — M810 Age-related osteoporosis without current pathological fracture: Secondary | ICD-10-CM

## 2022-11-03 MED ORDER — ALPRAZOLAM 0.25 MG PO TABS
ORAL_TABLET | ORAL | 0 refills | Status: DC
Start: 2022-11-03 — End: 2023-01-08

## 2022-11-03 MED ORDER — METHYLPREDNISOLONE 4 MG PO TBPK: ORAL_TABLET | ORAL | 1 refills | Status: DC

## 2022-11-03 NOTE — Progress Notes (Signed)
[facility]    Provider:  Dr Lucia Gaskins Requesting Provider: Raynelle Dick, NP Primary Care Provider:  Lucky Cowboy, MD  CC:  Lumbar radiculopathy  HPI:  [name] is a 72 y.o. female here as requested by Raynelle Dick, NP for chronic low back pain radiating to leg. has GERD (gastroesophageal reflux disease); Vitamin D deficiency; Hyperlipidemia, mixed; Labile hypertension; Medication management; Abnormal glucose; Osteoporosis; Bigeminy; Upper airway cough syndrome; Fear of side effects of medication; Environmental allergies; Cholesterolosis of gallbladder; Irritable bowel syndrome with constipation; Pain in right knee; Neutropenia (HCC); and B12 deficiency on their problem list.  Started a year ago started having pain radiating in the thigh area and hip and into the thigh and down the front of the leg, very painful, also chronic low back ain with radiation. Both legs now both exactly the same. Right is worse. Worse on rising painful, weakness, limpas a lot, difficut climbing stairs, numbness, tingling, shooting in the shin area. No falls, no inciting events. She went to PT at least 6 weeks did not help. quickly progressive, not getting better. No falls. No changes in bowel/bladder. No numbness or tingling in the feet. But goes down to the ankles. She has osteoporosis in her spine and L4/L5 changes seen on other non-dedicated films like CT Abd/pelv and dexa. Balance not affected, limps a lot[facility] Been under the care of physicians for a year, tried stretching, PT, silver sneakers, OTC meds, heating and cooling, stretching, failed conservative therapy. And also been to chiropractor for >3 months. Been to ortho, pcp and now neuro. Also muscle cramps. No other focal neurologic deficits, associated symptoms, inciting events or modifiable factors.  Reviewed notes, labs and imaging from outside physicians, which showed:  Review of records: she  went to [facility] 12/27/2018: "I had a long discussion with the patient. I do not see anything ominous in her x-rays or clinical presentation. I do believe she most likely either has a synovitis or a degenerative medial meniscus. She has nonmechanical at this point in time. We will continue to treat conservatively with ice, rest, and anti-inflammatories. We did give her a brace for stabilization. Recheck in 2 weeks to see her progress, earlier if needed. All questions were encouraged and answered. If this does not get any better we may consider MRI in her knee, potential injection, or physical therapy. "   From a thorough review of records, EPIC and care everywhere, last imaging of lumbar spine: 07/22/2012    LUMBAR SPINE - COMPLETE 4+ VIEW   Comparison: None.   Findings: There are five lumbar type vertebral bodies identified.  Vertebral body heights appear maintained.  There is disc space  narrowing at the L4-5 level with associated anterolisthesis of L4  on L5 measuring 9 mm. Oblique views do not definitively demonstrate  associated pars defects although facet sclerosis is seen at the L4-  5 and L5 S1 levels bilaterally.  No acute fracture is seen.  The  sacral white lines and sacroiliac joints appear maintained.   IMPRESSION:  Anterolisthesis of L4 on L5 with associated disc space narrowing at  the L4-5 level.  Bilateral lower lumbar facet sclerosis as  described above   But in CT ABD/PELVIS 202 showed MUSCULOSKELETAL. Grade 1 L4-5 anterolisthesis due to facet  Arthrosis and DEXA scan in 2023 did show osteoporosis in the lumbar spine.  CBC    Component Value Date/Time   WBC 2.3 (L) 10/13/2022 1036  RBC 4.39 10/13/2022 1036   HGB 12.6 10/13/2022 1036   HGB 12.6 02/25/2022 1510   HCT 37.2 10/13/2022 1036   PLT 166 10/13/2022 1036   PLT 169 02/25/2022 1510   MCV 84.7 10/13/2022 1036   MCH 28.7 10/13/2022 1036   MCHC 33.9 10/13/2022 1036   RDW 13.4 10/13/2022 1036   LYMPHSABS  1,323 10/13/2022 1036   MONOABS 0.2 04/17/2022 1400   EOSABS 71 10/13/2022 1036   BASOSABS 21 10/13/2022 1036      Latest Ref Rng & Units 10/13/2022   10:36 AM 04/17/2022   10:21 AM 04/03/2022   12:00 AM  CMP  Glucose 65 - 99 mg/dL 88  97  83   BUN 7 - 25 mg/dL 16  10  13    Creatinine 0.60 - 1.00 mg/dL 4.54  0.98  1.19   Sodium 135 - 146 mmol/L 140  137  138   Potassium 3.5 - 5.3 mmol/L 4.5  4.3  4.6   Chloride 98 - 110 mmol/L 104  104  104   CO2 20 - 32 mmol/L 28  25  28    Calcium 8.6 - 10.4 mg/dL 9.6  9.5  9.4   Total Protein 6.1 - 8.1 g/dL 7.0   6.7   Total Bilirubin 0.2 - 1.2 mg/dL 0.5   0.4   AST 10 - 35 U/L 21   17   ALT 6 - 29 U/L 17   14    09/2022 vit d 74 hgba1c 5.8   Review of Systems: Patient complains of symptoms per HPI as well as the following symptoms back pain. Pertinent negatives and positives per HPI. All others negative.   Social History   Socioeconomic History   Marital status: Married    Spouse name: Not on file   Number of children: 0   Years of education: Not on file   Highest education level: Not on file  Occupational History   Occupation: retired Mining engineer  Tobacco Use   Smoking status: Never    Passive exposure: Never   Smokeless tobacco: Never  Vaping Use   Vaping Use: Never used  Substance and Sexual Activity   Alcohol use: Not Currently   Drug use: No   Sexual activity: Yes    Birth control/protection: Post-menopausal    Comment: 1st intercourse 75 yo-5 partners  Other Topics Concern   Not on file  Social History Narrative   Lives at home with spouse   Right handed   Caffeine: not much, drinks decaf 2 cups/day   Social Determinants of Health   Financial Resource Strain: Not on file  Food Insecurity: Not on file  Transportation Needs: Not on file  Physical Activity: Not on file  Stress: Not on file  Social Connections: Not on file  Intimate Partner Violence: Not on file    Family History  Problem Relation  Age of Onset   Hypertension Mother    Other Mother        surgeries involving upper back   Diabetes Father    Heart failure Father    Cancer Maternal Grandmother        Lung   Leukemia Maternal Aunt    Allergic rhinitis Neg Hx    Angioedema Neg Hx    Asthma Neg Hx    Atopy Neg Hx    Eczema Neg Hx    Immunodeficiency Neg Hx    Urticaria Neg Hx    Migraines Neg Hx  Past Medical History:  Diagnosis Date   Arthritis    cervical and lumbar   Cataract    Mixed OU   Costochondritis 08/27/2020   Depression    GERD (gastroesophageal reflux disease)    Hyperlipidemia    IBS (irritable bowel syndrome)    Osteoporosis    Prediabetes    Vitamin D deficiency     Patient Active Problem List   Diagnosis Date Noted   B12 deficiency 10/15/2021   Neutropenia (HCC) 10/03/2021   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   Environmental allergies 04/24/2020   Irritable bowel syndrome with constipation 09/02/2019   Fear of side effects of medication 08/28/2019   Upper airway cough syndrome 05/04/2019   Pain in right knee 12/27/2018   Bigeminy 03/25/2018   Osteoporosis 08/11/2017   Abnormal glucose 06/25/2017   Medication management 12/12/2014   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 Sharyn Lull)   RADIOACTIVE SEED GUIDED EXCISIONAL BREAST BIOPSY Right 03/11/2016   Procedure: RIGHT RADIOACTIVE SEED GUIDED EXCISIONAL BREAST BIOPSY;  Surgeon: Emelia Loron, MD;  Location: [facility];  Service: General;  Laterality: Right;    Current Outpatient Medications  Medication Sig Dispense Refill   Acetaminophen (TYLENOL 8 HOUR PO) Take by mouth.     ALPRAZolam (XANAX) 0.25 MG tablet Take 1-2 tabs (0.25mg -0.50mg ) 30-60 minutes before procedure. May repeat if needed.Do not drive. 4 tablet 0    alum & mag hydroxide-simeth (GERI-LANTA) 200-200-20 MG/5ML suspension Take by mouth.     aspirin 81 MG tablet Take 81 mg by mouth daily.     Bacillus Coagulans-Inulin (ALIGN PREBIOTIC-PROBIOTIC PO) Take 1 tablet by mouth daily.     Cholecalciferol 125 MCG (5000 UT) capsule Take 5,000 Units by mouth daily. Taking 4,000 units     ezetimibe (ZETIA) 10 MG tablet Take 1 tablet Dail for Cholesterol 90 tablet 3   Fluticasone Propionate (FLONASE ALLERGY RELIEF NA) Place 2 sprays into the nose as needed.     Hyoscyamine Sulfate (HYOSCYAMINE PO) Take by mouth as needed.     ibuprofen (ADVIL) 400 MG tablet Take 400 mg by mouth every 6 (six) hours as needed.     loratadine (CLARITIN) 10 MG tablet Take 1 tablet (10 mg total) by mouth 2 (two) times daily as needed for allergies (Can take an extra dose during flare ups.). Take 1 tablet 1-2 times daily 60 tablet 11   Lutein 20 MG TABS Take by mouth.     Magnesium 250 MG TABS      methylPREDNISolone (MEDROL DOSEPAK) 4 MG TBPK tablet Take pills daily all together with food. Take the first dose (6 pills) as soon as possible. Take the rest each morning. For 6 days total 6-5-4-3-2-1. 21 tablet 1   Multiple Vitamin (MULTIVITAMIN) tablet Take 1 tablet by mouth daily.     nitroGLYCERIN (NITROSTAT) 0.4 MG SL tablet Place 0.4 mg under the tongue every 5 (five) minutes as needed for chest pain.     Polyethylene Glycol 3350 (MIRALAX PO) Take by mouth at bedtime.     RESTASIS 0.05 % ophthalmic emulsion instill 1 drop into both eyes twice a day  0   triamcinolone (NASACORT) 55 MCG/ACT AERO nasal inhaler Place into the nose.     vitamin B-12 (CYANOCOBALAMIN) 1000 MCG tablet Take 1,000 mcg  by mouth 3 (three) times a week.     No current facility-administered medications for this visit.    Allergies as of 11/03/2022 - Review Complete 11/03/2022  Allergen Reaction Noted   Bactrim [sulfamethoxazole-trimethoprim] Other (See Comments) 05/17/2013   Cephalexin Nausea And Vomiting  01/31/2020   Sulfa antibiotics  04/03/2021    Vitals: BP (!) 92/52   Pulse (!) 53   Ht 5\' 4"  (1.626 m)   Wt 142 lb (64.4 kg)   BMI 24.37 kg/m  Last Weight:  Wt Readings from Last 1 Encounters:  11/03/22 142 lb (64.4 kg)   Last Height:   Ht Readings from Last 1 Encounters:  11/03/22 5\' 4"  (1.626 m)     Physical exam: Exam: Gen: NAD, conversant, well nourised,  well groomed                     CV: RRR, no MRG. No Carotid Bruits. No peripheral edema, warm, nontender Eyes: Conjunctivae clear without exudates or hemorrhage  Neuro: Detailed Neurologic Exam  Speech:    Speech is normal; fluent and spontaneous with normal comprehension.  Cognition:    The patient is oriented to person, place, and time;     recent and remote memory intact;     language fluent;     normal attention, concentration,     fund of knowledge Cranial Nerves:    The pupils are equal, round, and reactive to light. Pupils too small to visualize  Visual fields are full to finger confrontation. Extraocular movements are intact. Trigeminal sensation is intact and the muscles of mastication are normal. The face is symmetric. The palate elevates in the midline. Hearing intact. Voice is normal. Shoulder shrug is normal. The tongue has normal motion without fasciculations.   Coordination:    Normal finger to nose   Gait:    Heel-toe and tandem gait are normal. antalic  Motor Observation:    No asymmetry, no atrophy, and no involuntary movements noted. Tone:    Normal muscle tone.    Posture:    Posture is normal. normal erect    Strength: weakness hip flexion and leg flexion r> left otherwise    Strength is V/V in the upper and lower limbs.      Sensation: intact to LT     Reflex Exam:  DTR's:    Deep tendon reflexes in the upper and lower extremities are brisk  bilaterally.   Toes:    The toes are downgoing bilaterally.   Clonus:    Clonus is absent.    Assessment/Plan:  [name] is  a 72 y.o. female here as requested by Raynelle Dick, NP for chronic low back pain radiating to leg. has GERD (gastroesophageal reflux disease); Vitamin D deficiency; Hyperlipidemia, mixed; Labile hypertension; Medication management; Abnormal glucose; Osteoporosis; Bigeminy; Upper airway cough syndrome; Fear of side effects of medication; Environmental allergies; Cholesterolosis of gallbladder; Irritable bowel syndrome with constipation; Pain in right knee; Neutropenia (HCC); and B12 deficiency on their problem list.  Started a year ago started having pain radiating in the thigh area and hip and into the thigh and down the front of the leg, very painful, also chronic low back ain with radiation.  And cramping. Worse on rising painful, weakness, limpas a lot, difficut climbing stairs, numbness, tingling, shooting in the anterolateral thigh and lower leg shin area. No falls, no inciting events.  xray 10 years ago : There is disc space narrowing at the L4-5  level with associated anterolisthesis of L4 on L5 measuring 9 mm.-She has osteoporosis in her spine at L4/L5 changes seen on other non-dedicated films like CT Abd/pelv and dexa. , at risk for fractures, area c/w symptoms needs MRI lumbar spine  She went to PT at least 6 weeks did not help. quickly progressive, not getting better.  [facility] Been under the care of physicians for a year, tried stretching, PT, silver sneakers, OTC meds, heating and cooling, stretching, failed conservative therapy. And also been to chiropractor for >3 months. Been to ortho, pcp and now neuro.   MRI lumbar spine. If confirms our theory, send to neurosugery try shots first or see a consult for shots vs surgery.  If MRI isn't conclusive, emg/ncs   Orders Placed This Encounter  Procedures   MR LUMBAR SPINE WO CONTRAST   NCV with EMG(electromyography)   Meds ordered this encounter  Medications   ALPRAZolam (XANAX) 0.25 MG tablet    Sig: Take 1-2  tabs (0.25mg -0.50mg ) 30-60 minutes before procedure. May repeat if needed.Do not drive.    Dispense:  4 tablet    Refill:  0   methylPREDNISolone (MEDROL DOSEPAK) 4 MG TBPK tablet    Sig: Take pills daily all together with food. Take the first dose (6 pills) as soon as possible. Take the rest each morning. For 6 days total 6-5-4-3-2-1.    Dispense:  21 tablet    Refill:  1    Cc: Raynelle Dick, NP,  Lucky Cowboy, MD  Naomie Dean, MD  [facility] [address] [place], [place] [address]  Phone [phone] Fax [phone]

## 2022-11-03 NOTE — Telephone Encounter (Signed)
[employer] medicare NPR sent to GI [phone] 

## 2022-11-03 NOTE — Patient Instructions (Addendum)
MRI lumbar spine. If confirms our theory, send to neurosugery try shots first or see a consult for shots vs surgery.  If MRI isn't conclusive, emg/ncs  Orders Placed This Encounter  Procedures   MR LUMBAR SPINE WO CONTRAST   NCV with EMG(electromyography)     Electromyoneurogram Electromyoneurogram is a test to check how well your muscles and nerves are working. This procedure includes the combined use of electromyogram (EMG) and nerve conduction study (NCS). EMG is used to evaluate muscles and the nerves that control those muscles. NCS, which is also called electroneurogram, measures how well your nerves conduct electricity. The procedures should be done together to check if your muscles and nerves are healthy. If the results of the tests are abnormal, this may indicate disease or injury, such as a neuromuscular disease or peripheral nerve damage. Tell a health care provider about: Any allergies you have. All medicines you are taking, including vitamins, herbs, eye drops, creams, and over-the-counter medicines. Any bleeding problems you have. Any surgeries you have had. Any medical conditions you have. What are the risks? Generally, this is a safe procedure. However, problems may occur, including: Bleeding or bruising. Infection where the electrodes were inserted. What happens before the test? Medicines Take all of your usually prescribed medications before this testing is performed. Do not stop your blood thinners unless advised by your prescribing physician. General instructions Your health care provider may ask you to warm the limb that will be checked with warm water, hot pack, or wrapping the limb in a blanket. Do not use lotions or creams on the same day that you will be having the procedure. What happens during the test? For EMG  Your health care provider will ask you to stay in a position so that the muscle being studied can be accessed. You will be sitting or lying  down. You may be given a medicine to numb the area (local anesthetic) and the skin will be disinfected. A very thin needle that has an electrode will be inserted into your muscle, one muscle at a time. Typically, multiple muscles are evaluated during a single study. Another small electrode will be placed on your skin near the muscle. Your health care provider will ask you to continue to remain still. The electrodes will record the electrical activity of your muscles. You may see this on a monitor or hear it in the room. After your muscles have been studied at rest, your health care provider will ask you to contract or flex your muscles. The electrodes will record the electrical activity of your muscles. Your health care provider will remove the electrodes and the electrode needle when the procedure is finished. The procedure may vary among health care providers and hospitals. For NCS  An electrode that records your nerve activity (recording electrode) will be placed on your skin by the muscle that is being studied. An electrode that is used as a reference (reference electrode) will be placed near the recording electrode. A paste or gel will be applied to your skin between the recording electrode and the reference electrode. Your nerve will be stimulated with a mild shock. The speed of the nerves and strength of response is recorded by the electrodes. Your health care provider will remove the electrodes and the gel when the procedure is finished. The procedure may vary among health care providers and hospitals. What can I expect after the test? It is up to you to get your test results. Ask your health care  provider, or the department that is doing the test, when your results will be ready. Your health care provider may: Give you medicines for any pain. Monitor the insertion sites to make sure that bleeding stops. You should be able to drive yourself to and from the test. Discomfort can persist  for a few hours after the test, but should be better the next day. Contact a health care provider if: You have swelling, redness, or drainage at any of the insertion sites. Summary Electromyoneurogram is a test to check how well your muscles and nerves are working. If the results of the tests are abnormal, this may indicate disease or injury. This is a safe procedure. However, problems may occur, such as bleeding and infection. Your health care provider will do two tests to complete this procedure. One checks your muscles (EMG) and another checks your nerves (NCS). It is up to you to get your test results. Ask your health care provider, or the department that is doing the test, when your results will be ready. This information is not intended to replace advice given to you by your health care provider. Make sure you discuss any questions you have with your health care provider. Document Revised: 02/13/2021 Document Reviewed: 01/13/2021 Elsevier Patient Education  2023 [employer].

## 2022-11-07 ENCOUNTER — Other Ambulatory Visit (HOSPITAL_BASED_OUTPATIENT_CLINIC_OR_DEPARTMENT_OTHER): Payer: Self-pay

## 2022-11-07 MED ORDER — COMIRNATY 30 MCG/0.3ML IM SUSY
0.3000 mL | PREFILLED_SYRINGE | INTRAMUSCULAR | 0 refills | Status: DC
  Filled 2022-11-07: qty 0.3, 1d supply, fill #0

## 2022-11-17 ENCOUNTER — Other Ambulatory Visit: Payer: Self-pay | Admitting: Internal Medicine

## 2022-11-17 DIAGNOSIS — E782 Mixed hyperlipidemia: Secondary | ICD-10-CM

## 2022-11-17 MED ORDER — EZETIMIBE 10 MG PO TABS
ORAL_TABLET | ORAL | 3 refills | Status: AC
Start: 2022-11-17 — End: ?

## 2022-11-19 ENCOUNTER — Telehealth: Payer: Self-pay | Admitting: Neurology

## 2022-11-19 NOTE — Telephone Encounter (Signed)
I attempted to call the patient. She was unavailable. I left a voicemail (as per DPR) informing her that Dr. Lucia Gaskins sent alprazolam .25 mg to [facility] on [address] I advised that she needs a driver to take her to the MRI appointment. I have informed her via MyChart as well.

## 2022-11-19 NOTE — Telephone Encounter (Signed)
Pt requesting medication to relax her during MRI. Should be sent to [id number]

## 2022-11-25 ENCOUNTER — Ambulatory Visit
Admission: RE | Admit: 2022-11-25 | Discharge: 2022-11-25 | Disposition: A | Discharge status: Home / Self Care | Payer: Medicare Other | Source: Ambulatory Visit | Attending: Neurology | Admitting: Neurology

## 2022-11-25 ENCOUNTER — Other Ambulatory Visit: Payer: Medicare Other

## 2022-11-25 DIAGNOSIS — R200 Anesthesia of skin: Secondary | ICD-10-CM | POA: Diagnosis not present

## 2022-11-25 DIAGNOSIS — R29818 Other symptoms and signs involving the nervous system: Secondary | ICD-10-CM

## 2022-11-25 DIAGNOSIS — M5416 Radiculopathy, lumbar region: Secondary | ICD-10-CM | POA: Diagnosis not present

## 2022-11-25 DIAGNOSIS — R29898 Other symptoms and signs involving the musculoskeletal system: Secondary | ICD-10-CM

## 2022-11-25 DIAGNOSIS — M810 Age-related osteoporosis without current pathological fracture: Secondary | ICD-10-CM

## 2022-11-25 DIAGNOSIS — Z9181 History of falling: Secondary | ICD-10-CM

## 2022-11-27 ENCOUNTER — Telehealth: Payer: Self-pay

## 2022-11-27 ENCOUNTER — Telehealth: Payer: Self-pay | Admitting: Neurology

## 2022-11-27 ENCOUNTER — Other Ambulatory Visit: Payer: Self-pay | Admitting: Neurology

## 2022-11-27 DIAGNOSIS — M4807 Spinal stenosis, lumbosacral region: Secondary | ICD-10-CM

## 2022-11-27 NOTE — Telephone Encounter (Signed)
Referral faxed to [facility] Phone: [phone]  Fax: [phone] 

## 2022-11-27 NOTE — Telephone Encounter (Signed)
I called and spoke to patient about results and consultation, she was agreeable to referral for neurosurgery. I have not yet placed referral, is there somewhere specific you would like them to go? Can you place referral if so? Thank you!

## 2022-11-27 NOTE — Telephone Encounter (Signed)
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 I will ask my team to call you and if you like we can refer you thanks.

## 2022-12-09 LAB — HM MAMMOGRAPHY

## 2022-12-16 ENCOUNTER — Institutional Professional Consult (permissible substitution): Payer: Medicare Other | Admitting: Neurology

## 2022-12-24 ENCOUNTER — Encounter: Payer: Self-pay | Admitting: Internal Medicine

## 2023-01-05 ENCOUNTER — Ambulatory Visit: Payer: Medicare Other | Admitting: Nurse Practitioner

## 2023-01-06 ENCOUNTER — Ambulatory Visit: Payer: Medicare Other | Admitting: Nurse Practitioner

## 2023-01-08 ENCOUNTER — Encounter: Payer: Self-pay | Admitting: Nurse Practitioner

## 2023-01-08 ENCOUNTER — Ambulatory Visit: Payer: Medicare Other | Admitting: Nurse Practitioner

## 2023-01-08 VITALS — BP 100/66 | HR 54 | Temp 97.4°F | Ht 64.0 in | Wt 141.8 lb

## 2023-01-08 DIAGNOSIS — R0989 Other specified symptoms and signs involving the circulatory and respiratory systems: Secondary | ICD-10-CM

## 2023-01-08 DIAGNOSIS — E782 Mixed hyperlipidemia: Secondary | ICD-10-CM | POA: Diagnosis not present

## 2023-01-08 DIAGNOSIS — M25551 Pain in right hip: Secondary | ICD-10-CM

## 2023-01-08 DIAGNOSIS — M2040 Other hammer toe(s) (acquired), unspecified foot: Secondary | ICD-10-CM

## 2023-01-08 DIAGNOSIS — R7309 Other abnormal glucose: Secondary | ICD-10-CM | POA: Diagnosis not present

## 2023-01-08 DIAGNOSIS — Z79899 Other long term (current) drug therapy: Secondary | ICD-10-CM

## 2023-01-08 DIAGNOSIS — M25552 Pain in left hip: Secondary | ICD-10-CM

## 2023-01-08 DIAGNOSIS — M79604 Pain in right leg: Secondary | ICD-10-CM

## 2023-01-08 DIAGNOSIS — M810 Age-related osteoporosis without current pathological fracture: Secondary | ICD-10-CM

## 2023-01-08 DIAGNOSIS — E559 Vitamin D deficiency, unspecified: Secondary | ICD-10-CM | POA: Diagnosis not present

## 2023-01-08 DIAGNOSIS — M545 Low back pain, unspecified: Secondary | ICD-10-CM

## 2023-01-08 DIAGNOSIS — K219 Gastro-esophageal reflux disease without esophagitis: Secondary | ICD-10-CM

## 2023-01-08 LAB — CBC WITH DIFFERENTIAL/PLATELET
Absolute Monocytes: 170 cells/uL — ABNORMAL LOW (ref 200–950)
Basophils Absolute: 11 cells/uL (ref 0–200)
Basophils Relative: 0.5 %
Eosinophils Absolute: 69 cells/uL (ref 15–500)
Eosinophils Relative: 3.3 %
HCT: 37.1 % (ref 35.0–45.0)
Hemoglobin: 12.4 g/dL (ref 11.7–15.5)
Lymphs Abs: 1140 cells/uL (ref 850–3900)
MCH: 28.3 pg (ref 27.0–33.0)
MCHC: 33.4 g/dL (ref 32.0–36.0)
MCV: 84.7 fL (ref 80.0–100.0)
MPV: 11.8 fL (ref 7.5–12.5)
Monocytes Relative: 8.1 %
Neutro Abs: 710 cells/uL — ABNORMAL LOW (ref 1500–7800)
Neutrophils Relative %: 33.8 %
Platelets: 172 10*3/uL (ref 140–400)
RBC: 4.38 10*6/uL (ref 3.80–5.10)
RDW: 13.2 % (ref 11.0–15.0)
Total Lymphocyte: 54.3 %
WBC: 2.1 10*3/uL — ABNORMAL LOW (ref 3.8–10.8)

## 2023-01-08 NOTE — Progress Notes (Signed)
FOLLOW UP  Assessment and Plan:   Labile hypertension Controlled Discussed DASH (Dietary Approaches to Stop Hypertension) DASH diet is lower in sodium than a typical American diet. Cut back on foods that are high in saturated fat, cholesterol, and trans fats. Eat more whole-grain foods, fish, poultry, and nuts Remain active and exercise as tolerated daily.  Monitor BP at home-Call if greater than 130/80.  Check CMP/CBC  - CBC with Differential/Platelet - COMPLETE METABOLIC PANEL WITH GFR  Hyperlipidemia, mixed Discussed lifestyle modifications. Recommended diet heavy in fruits and veggies, omega 3's. Decrease consumption of animal meats, cheeses, and dairy products. Remain active and exercise as tolerated. Continue to monitor. Check lipids/TSH  - Lipid panel  Abnormal glucose Education: Reviewed 'ABCs' of diabetes management  Discussed goals to be met and/or maintained include A1C (<7) Blood pressure (<130/80) Cholesterol (LDL <70) Continue Eye Exam yearly  Continue Dental Exam Q6 mo Discussed dietary recommendations Discussed Physical Activity recommendations Check A1C  - Hemoglobin A1C w/out eAG  Vitamin D deficiency Continue supplement for goal of 60-100 Monitor Vitamin D levels  - VITAMIN D 25 Hydroxy (Vit-D Deficiency, Fractures)  Bilateral hip pain/Low back pain Alternate Ice/Heat Rest when flared OTC analgesic or anti-inflammatory PRN Continue to monitor  Gastroesophageal reflux disease, unspecified whether esophagitis present No suspected reflux complications (Barret/stricture). Lifestyle modification:  wt loss, avoid meals 2-3h before bedtime. Consider eliminating food triggers:  chocolate, caffeine, EtOH, acid/spicy food.  Osteoporosis without current pathological fracture, unspecified osteoporosis type Pursue a combination of weight-bearing exercises and strength training. Advised on fall prevention measures including proper lighting in all  rooms, removal of area rugs and floor clutter, use of walking devices as deemed appropriate, avoidance of uneven walking surfaces. Smoking cessation and moderate alcohol consumption if applicable Consume 800 to 1000 IU of vitamin D daily with a goal vitamin D serum value of 30 ng/mL or higher. Aim for 1000 to 1200 mg of elemental calcium daily through supplements and/or dietary sources.  Medication management  All medications discussed and reviewed in full. All questions and concerns regarding medications addressed.    - CBC with Differential/Platelet - COMPLETE METABOLIC PANEL WITH GFR - Lipid panel - Hemoglobin A1C w/out eAG - VITAMIN D 25 Hydroxy (Vit-D Deficiency, Fractures)  Hammer Toe Continue to follow with Podiatry for scheduled surgery 01/17/23   Notify office for further evaluation and treatment, questions or concerns if any reported s/s fail to improve.   The patient was advised to call back or seek an in-person evaluation if any symptoms worsen or if the condition fails to improve as anticipated.   Further disposition pending results of labs. Discussed med's effects and SE's.    I discussed the assessment and treatment plan with the patient. The patient was provided an opportunity to ask questions and all were answered. The patient agreed with the plan and demonstrated an understanding of the instructions.  Discussed med's effects and SE's. Screening labs and tests as requested with regular follow-up as recommended.  I provided 25 minutes of face-to-face time during this encounter including counseling, chart review, and critical decision making was preformed.  Today's Plan of Care is based on a patient-centered health care approach known as shared decision making - the decisions, tests and treatments allow for patient preferences and values to be balanced with clinical evidence.    Future Appointments  Date Time Provider Department Center  01/22/2023  8:30 AM Meryl Crutch,  CMA [facility] None  01/22/2023  9:30 AM Naomie Dean  B, MD [facility] None  04/09/2023 10:00 AM Lucky Cowboy, MD [facility] None  07/24/2023 10:00 AM Carlus Pavlov, MD [facility] None    ----------------------------------------------------------------------------------------------------------------------  HPI 72 y.o. female  presents for 3 month follow up on hypertension, cholesterol, diabetes, weight and vitamin D deficiency.   Overall patient is doing well.    She is scheduled to have a hammer toe surgery and bunion surgery on 01/17/23 with Dr. Windell Moment, Podiatry.    BMI is Body mass index is 24.34 kg/m., she has been working on diet and exercise. Wt Readings from Last 3 Encounters:  01/08/23 141 lb 12.8 oz (64.3 kg)  11/03/22 142 lb (64.4 kg)  10/13/22 141 lb 6.4 oz (64.1 kg)    Her blood pressure has been controlled at home, today their BP is BP: 100/66  She does workout. She denies chest pain, shortness of breath, dizziness.   She is on cholesterol medication Zetia and denies myalgias. Her cholesterol is not at goal. The cholesterol last visit was:   Lab Results  Component Value Date   CHOL 221 (H) 10/13/2022   HDL 90 10/13/2022   LDLCALC 115 (H) 10/13/2022   TRIG 68 10/13/2022   CHOLHDL 2.5 10/13/2022    She has been working on diet and exercise for prediabetes, and denies polydipsia and polyuria. Last A1C in the office was:  Lab Results  Component Value Date   HGBA1C 5.8 (H) 10/13/2022   Patient is on Vitamin D supplement.   Lab Results  Component Value Date   VD25OH 74 10/13/2022     She has a hx of low back pain and hip pain with osteoporosis - currently well controlled.  She is continuing to exercise.    Acid reflux is controlled by avoid triggers and monitoring weigh. Denies any recent flare.  Current Medications:  Current Outpatient Medications on File Prior to Visit  Medication Sig   Acetaminophen (TYLENOL 8 HOUR PO) Take by mouth.   alum & mag  hydroxide-simeth (GERI-LANTA) 200-200-20 MG/5ML suspension Take by mouth.   aspirin 81 MG tablet Take 81 mg by mouth daily.   Bacillus Coagulans-Inulin (ALIGN PREBIOTIC-PROBIOTIC PO) Take 1 tablet by mouth daily.   Cholecalciferol 125 MCG (5000 UT) capsule Take 5,000 Units by mouth daily. Taking 4,000 units   COVID-19 mRNA vaccine 2023-2024 (COMIRNATY) syringe Inject 0.3 mLs into the muscle.   ezetimibe (ZETIA) 10 MG tablet Take 1 tablet Daily  for Cholesterol                              /                                                                   TAKE                                         BY  MOUTH   Fluticasone Propionate (FLONASE ALLERGY RELIEF NA) Place 2 sprays into the nose as needed.   Hyoscyamine Sulfate (HYOSCYAMINE PO) Take by mouth as needed.   ibuprofen (ADVIL) 400 MG tablet Take 400 mg by mouth every 6 (six) hours as needed.   loratadine (CLARITIN) 10 MG tablet Take 1 tablet (10 mg total) by mouth 2 (two) times daily as needed for allergies (Can take an extra dose during flare ups.). Take 1 tablet 1-2 times daily   Lutein 20 MG TABS Take by mouth.   Magnesium 250 MG TABS    Multiple Vitamin (MULTIVITAMIN) tablet Take 1 tablet by mouth daily.   nitroGLYCERIN (NITROSTAT) 0.4 MG SL tablet Place 0.4 mg under the tongue every 5 (five) minutes as needed for chest pain.   Polyethylene Glycol 3350 (MIRALAX PO) Take by mouth at bedtime.   RESTASIS 0.05 % ophthalmic emulsion instill 1 drop into both eyes twice a day   triamcinolone (NASACORT) 55 MCG/ACT AERO nasal inhaler Place into the nose.   vitamin B-12 (CYANOCOBALAMIN) 1000 MCG tablet Take 1,000 mcg by mouth 3 (three) times a week.   ALPRAZolam (XANAX) 0.25 MG tablet Take 1-2 tabs (0.25mg -0.50mg ) 30-60 minutes before procedure. May repeat if needed.Do not drive.   methylPREDNISolone (MEDROL DOSEPAK) 4 MG TBPK tablet Take pills daily all together with food. Take the first dose (6  pills) as soon as possible. Take the rest each morning. For 6 days total 6-5-4-3-2-1.   No current facility-administered medications on file prior to visit.     Allergies:  Allergies  Allergen Reactions   Bactrim [Sulfamethoxazole-Trimethoprim] Other (See Comments)    Patient preference to take medication without sulfa   Cephalexin Nausea And Vomiting    Rash; chest pain Rash; chest pain   Sulfa Antibiotics      Medical History:  Past Medical History:  Diagnosis Date   Arthritis    cervical and lumbar   Cataract    Mixed OU   Costochondritis 08/27/2020   Depression    GERD (gastroesophageal reflux disease)    Hyperlipidemia    IBS (irritable bowel syndrome)    Osteoporosis    Prediabetes    Vitamin D deficiency    Family history- Reviewed and unchanged Social history- Reviewed and unchanged   Review of Systems: A complete ROS was performed with pertinent positives/negatives noted in the HPI. The remainder of the ROS are negative.  Physical Exam: BP 100/66   Pulse (!) 54   Temp (!) 97.4 F (36.3 C)   Ht 5\' 4"  (1.626 m)   Wt 141 lb 12.8 oz (64.3 kg)   SpO2 99%   BMI 24.34 kg/m  Wt Readings from Last 3 Encounters:  01/08/23 141 lb 12.8 oz (64.3 kg)  11/03/22 142 lb (64.4 kg)  10/13/22 141 lb 6.4 oz (64.1 kg)   General Appearance: Well nourished, in no apparent distress. Eyes: PERRLA, EOMs, conjunctiva no swelling or erythema Sinuses: No Frontal/maxillary tenderness ENT/Mouth: Ext aud canals clear, TMs without erythema, bulging. No erythema, swelling, or exudate on post pharynx.  Tonsils not swollen or erythematous. Hearing normal.  Neck: Supple, thyroid normal.  Respiratory: Respiratory effort normal, BS equal bilaterally without rales, rhonchi, wheezing or stridor.  Cardio: RRR with no MRGs. Brisk peripheral pulses without edema.  Abdomen: Soft, + BS.  Non tender, no guarding, rebound, hernias, masses. Lymphatics: Non tender without lymphadenopathy.   Musculoskeletal: Full ROM, 5/5 strength, Normal gait Skin: Warm, dry without rashes, lesions, ecchymosis.  Neuro:  Cranial nerves intact. No cerebellar symptoms.  Psych: Awake and oriented X 3, normal affect, Insight and Judgment appropriate.    Adela Glimpse, NP 10:02 AM [facility]

## 2023-01-08 NOTE — Patient Instructions (Signed)

## 2023-01-09 LAB — COMPLETE METABOLIC PANEL WITH GFR: Creat: 0.73 mg/dL (ref 0.60–1.00)

## 2023-01-22 ENCOUNTER — Encounter: Payer: Medicare Other | Admitting: Neurology

## 2023-02-15 HISTORY — PX: FOOT SURGERY: SHX648

## 2023-02-18 ENCOUNTER — Emergency Department (HOSPITAL_BASED_OUTPATIENT_CLINIC_OR_DEPARTMENT_OTHER): Payer: Medicare Other | Admitting: Radiology

## 2023-02-18 ENCOUNTER — Emergency Department (HOSPITAL_BASED_OUTPATIENT_CLINIC_OR_DEPARTMENT_OTHER)
Admission: EM | Admit: 2023-02-18 | Discharge: 2023-02-18 | Disposition: A | Discharge status: Home / Self Care | Payer: Medicare Other | Attending: Emergency Medicine | Admitting: Emergency Medicine

## 2023-02-18 ENCOUNTER — Other Ambulatory Visit: Payer: Self-pay

## 2023-02-18 ENCOUNTER — Encounter (HOSPITAL_BASED_OUTPATIENT_CLINIC_OR_DEPARTMENT_OTHER): Payer: Self-pay | Admitting: Emergency Medicine

## 2023-02-18 DIAGNOSIS — Z7982 Long term (current) use of aspirin: Secondary | ICD-10-CM | POA: Diagnosis not present

## 2023-02-18 DIAGNOSIS — R001 Bradycardia, unspecified: Secondary | ICD-10-CM | POA: Insufficient documentation

## 2023-02-18 DIAGNOSIS — R079 Chest pain, unspecified: Secondary | ICD-10-CM | POA: Diagnosis present

## 2023-02-18 DIAGNOSIS — R0789 Other chest pain: Secondary | ICD-10-CM | POA: Insufficient documentation

## 2023-02-18 DIAGNOSIS — R110 Nausea: Secondary | ICD-10-CM | POA: Diagnosis not present

## 2023-02-18 LAB — BASIC METABOLIC PANEL
Anion gap: 11 (ref 5–15)
BUN: 18 mg/dL (ref 8–23)
CO2: 23 mmol/L (ref 22–32)
Calcium: 9.5 mg/dL (ref 8.9–10.3)
Chloride: 103 mmol/L (ref 98–111)
Creatinine, Ser: 0.77 mg/dL (ref 0.44–1.00)
GFR, Estimated: >60 mL/min (ref >60)
Glucose, Bld: 97 mg/dL (ref 70–99)
Potassium: 3.8 mmol/L (ref 3.5–5.1)
Sodium: 137 mmol/L (ref 135–145)

## 2023-02-18 LAB — CBC
HCT: 34.9 % — ABNORMAL LOW (ref 36.0–46.0)
Hemoglobin: 11.8 g/dL — ABNORMAL LOW (ref 12.0–15.0)
MCH: 28.6 pg (ref 26.0–34.0)
MCHC: 33.8 g/dL (ref 30.0–36.0)
MCV: 84.7 fL (ref 80.0–100.0)
Platelets: 155 10*3/uL (ref 150–400)
RBC: 4.12 MIL/uL (ref 3.87–5.11)
RDW: 13.4 % (ref 11.5–15.5)
WBC: 3.6 10*3/uL — ABNORMAL LOW (ref 4.0–10.5)
nRBC: 0 % (ref 0.0–0.2)

## 2023-02-18 LAB — TROPONIN I (HIGH SENSITIVITY): Troponin I (High Sensitivity): <2 ng/L (ref <18)

## 2023-02-18 MED ORDER — ONDANSETRON 4 MG PO TBDP
4.0000 mg | ORAL_TABLET | Freq: Once | ORAL | Status: AC
  Administered 2023-02-18: 4 mg via ORAL

## 2023-02-18 MED ORDER — ONDANSETRON 4 MG PO TBDP
4.0000 mg | ORAL_TABLET | Freq: Once | ORAL | Status: DC
  Filled 2023-02-18: qty 1

## 2023-02-18 MED ORDER — LIDOCAINE 5 % EX PTCH
1.0000 | MEDICATED_PATCH | CUTANEOUS | Status: DC
  Administered 2023-02-18: 1 via TRANSDERMAL
  Filled 2023-02-18: qty 1

## 2023-02-18 MED ORDER — ALUM & MAG HYDROXIDE-SIMETH 200-200-20 MG/5ML PO SUSP
30.0000 mL | Freq: Once | ORAL | Status: DC
  Filled 2023-02-18: qty 30

## 2023-02-18 NOTE — ED Notes (Signed)
DC papers reviewed. No questions or concerns. No signs of distress. Pt assisted to wheelchair and out to lobby. Appropriate measures for safety taken. 

## 2023-02-18 NOTE — Discharge Instructions (Signed)
As discussed, your labs and imaging are not indicative of a heart attack.  Your pain may be due to acid reflux, muscle strain, or inflammation of your chest wall.  You can take Mylanta at home as needed.  You can get lidocaine patches over-the-counter at the pharmacy if it helps with the pain here in the ED.  Follow-up with your cardiologist as scheduled.  You can also follow-up with your PCP in the next 1 to 2 days for reevaluation if you would like.  Get help right away if: You feel sick to your stomach (nauseous) or you throw up (vomit). You feel sweaty or light-headed. You have a cough with mucus from your lungs (sputum) or you cough up blood. You are short of breath.

## 2023-02-18 NOTE — ED Provider Notes (Signed)
[place] EMERGENCY DEPARTMENT AT [facility] Provider Note   CSN: [id number] Arrival date & time: 02/18/23  1840     History  Chief Complaint  Patient presents with   Chest Pain    [name] is a 72 y.o. female with a history of GERD, hyperlipidemia, costochondritis, and IBS who presents to the ED today for chest pain.  Patient reports intermittent chest pain for the past 4 days that is located under her left breast with associated nausea.  She denies any aggravating or alleviating factors.  She has tried Ibuprofen without any relief.  Patient reports she had foot surgery about a month ago and is using a walker now to ambulate and thinks her pain could possibly be associated with a muscle strain from the way she has to ambulate now.  She states that she does not currently have chest pain at the time of evaluation.  Denies any associated shortness of breath or back pain.  No other complaints or concerns at this time.    Home Medications Prior to Admission medications   Medication Sig Start Date End Date Taking? Authorizing Provider  Acetaminophen (TYLENOL 8 HOUR PO) Take by mouth.    [provider]  alum & mag hydroxide-simeth Venida Jarvis) 200-200-20 MG/5ML suspension Take by mouth. 05/05/18   [provider]  aspirin 81 MG tablet Take 81 mg by mouth daily.    [provider]  Bacillus Coagulans-Inulin (ALIGN PREBIOTIC-PROBIOTIC PO) Take 1 tablet by mouth daily.    [provider]  Cholecalciferol 125 MCG (5000 UT) capsule Take 5,000 Units by mouth daily. Taking 4,000 units    [provider]  COVID-19 mRNA vaccine [phone] (COMIRNATY) syringe Inject 0.3 mLs into the muscle. 11/07/22     ezetimibe (ZETIA) 10 MG tablet Take 1 tablet Daily  for Cholesterol                              /                                                                   TAKE                                         BY                                                  MOUTH 11/17/22   Lucky Cowboy, MD  Fluticasone Propionate [facility] ALLERGY RELIEF NA) Place 2 sprays into the nose as needed.    [provider]  Hyoscyamine Sulfate (HYOSCYAMINE PO) Take by mouth as needed.    [provider]  ibuprofen (ADVIL) 400 MG tablet Take 400 mg by mouth every 6 (six) hours as needed.    [provider]  loratadine (CLARITIN) 10 MG tablet Take 1 tablet (10 mg total) by mouth 2 (two) times daily as needed for allergies (Can take an extra dose during flare ups.).  Take 1 tablet 1-2 times daily 10/08/21   Kozlow, Alvira Philips, MD  Lutein 20 MG TABS Take by mouth.    [provider]  Magnesium 250 MG TABS     [provider]  Multiple Vitamin (MULTIVITAMIN) tablet Take 1 tablet by mouth daily.    [provider]  nitroGLYCERIN (NITROSTAT) 0.4 MG SL tablet Place 0.4 mg under the tongue every 5 (five) minutes as needed for chest pain.    [provider]  Polyethylene Glycol 3350 (MIRALAX PO) Take by mouth at bedtime.    [provider]  RESTASIS 0.05 % ophthalmic emulsion instill 1 drop into both eyes twice a day 11/08/14   [provider]  triamcinolone (NASACORT) 55 MCG/ACT AERO nasal inhaler Place into the nose. 12/02/19   [provider]  vitamin B-12 (CYANOCOBALAMIN) 1000 MCG tablet Take 1,000 mcg by mouth 3 (three) times a week.    [provider]      Allergies    Bactrim [sulfamethoxazole-trimethoprim], Cephalexin, and Sulfa antibiotics    Review of Systems   Review of Systems  Cardiovascular:  Positive for chest pain.  All other systems reviewed and are negative.   Physical Exam Updated Vital Signs BP 126/78 (BP Location: Right Arm)   Pulse 62   Temp 98.6 F (37 C)   Resp 19   Ht 5\' 4"  (1.626 m)   Wt 63 kg   SpO2 100%   BMI 23.86 kg/m  Physical Exam Vitals and nursing note reviewed.  Constitutional:      General: She is not in acute distress.     Appearance: Normal appearance.  HENT:     Head: Normocephalic and atraumatic.     Mouth/Throat:     Mouth: Mucous membranes are moist.  Eyes:     Conjunctiva/sclera: Conjunctivae normal.     Pupils: Pupils are equal, round, and reactive to light.  Cardiovascular:     Rate and Rhythm: Normal rate and regular rhythm.     Pulses: Normal pulses.     Heart sounds: Normal heart sounds.  Pulmonary:     Effort: Pulmonary effort is normal.     Breath sounds: Normal breath sounds.  Abdominal:     Palpations: Abdomen is soft.     Tenderness: There is no abdominal tenderness.  Musculoskeletal:        General: Normal range of motion.     Cervical back: Normal range of motion.  Skin:    General: Skin is warm and dry.     Findings: No rash.  Neurological:     General: No focal deficit present.     Mental Status: She is alert.     Sensory: No sensory deficit.     Motor: No weakness.  Psychiatric:        Mood and Affect: Mood normal.        Behavior: Behavior normal.     ED Results / Procedures / Treatments   Labs (all labs ordered are listed, but only abnormal results are displayed) Labs Reviewed  CBC - Abnormal; Notable for the following components:      Result Value   WBC 3.6 (*)    Hemoglobin 11.8 (*)    HCT 34.9 (*)    All other components within normal limits  BASIC METABOLIC PANEL  TROPONIN I (HIGH SENSITIVITY)  TROPONIN I (HIGH SENSITIVITY)    EKG EKG Interpretation Date/Time:  Wednesday February 18 2023 18:54:46 EDT Ventricular Rate:  58 PR  Interval:  150 QRS Duration:  76 QT Interval:  392 QTC Calculation: 384 R Axis:   63  Text Interpretation: Sinus bradycardia Nonspecific T wave abnormality Confirmed by Cathren Laine ([id number]) on 02/18/2023 7:01:24 PM  Radiology DG Chest 2 View  Result Date: 02/18/2023 CLINICAL DATA:  Chest pain.  Nausea and headache. EXAM: CHEST - 2 VIEW COMPARISON:  Two-view chest x-ray 04/17/2022 FINDINGS: The heart size is normal. Lungs are  mildly hyperexpanded. No edema or effusion is present. No focal airspace disease is present. IMPRESSION: Negative chest x-ray. Electronically Signed   By: Marin Roberts M.D.   On: 02/18/2023 19:42    Procedures Procedures: not indicated.   Medications Ordered in ED Medications  lidocaine (LIDODERM) 5 % 1 patch (1 patch Transdermal Patch Applied 02/18/23 2209)  ondansetron (ZOFRAN-ODT) disintegrating tablet 4 mg (4 mg Oral Given 02/18/23 2210)    ED Course/ Medical Decision Making/ A&P                                 Medical Decision Making Amount and/or Complexity of Data Reviewed Labs: ordered. Radiology: ordered.  Risk Prescription drug management.   This patient presents to the ED for concern of chest pain, this involves an extensive number of treatment options, and is a complaint that carries with it a high risk of complications and morbidity.   Differential diagnosis includes: ACS, pleural effusion, pneumothorax, pneumonia, costochondritis, pleurisy, GERD, muscle strain, etc.   Comorbidities  See HPI above   Additional History  Additional history obtained from patient's previous records.   Cardiac Monitoring / EKG  The patient was maintained on a cardiac monitor.  I personally viewed and interpreted the cardiac monitored which showed: Sinus bradycardia with a heart rate of 58 bpm.   Lab Tests  I ordered and personally interpreted labs.  The pertinent results include:   Negative troponin BMP is unremarkable - no acute AKI or electro abnormality WBC of 3.6 - otherwise within normal limits no acute infection or anemia.   Imaging Studies  I ordered imaging studies including CXR  I independently visualized and interpreted imaging which showed: No active cardiopulmonary disease. I agree with the radiologist interpretation   Problem List / ED Course / Critical Interventions / Medication Management  Chest pain I ordered medications including: Zofran for  nausea Lidocaine patch for pain Medications given prior to discharge I have reviewed the patients home medicines and have made adjustments as needed   Social Determinants of Health  Access to healthcare   Test / Admission - Considered  Discussed findings with patient. She is hemodynamically stable and safe for discharge home. Patient reports that she has an upcoming follow-up with cardiology. Return precautions provided.       Final Clinical Impression(s) / ED Diagnoses Final diagnoses:  Atypical chest pain    Rx / DC Orders ED Discharge Orders     None         Maxwell Marion, PA-C 02/18/23 2212    Cathren Laine, MD 02/19/23 [phone]

## 2023-02-18 NOTE — ED Triage Notes (Signed)
Pt arrives to ED with c/o intermittent chest pain underneath the left breast with associated nausea and headache that started x4 days ago lasting a few minutes at a time.

## 2023-02-25 ENCOUNTER — Encounter: Payer: Self-pay | Admitting: Internal Medicine

## 2023-02-25 ENCOUNTER — Ambulatory Visit (INDEPENDENT_AMBULATORY_CARE_PROVIDER_SITE_OTHER): Payer: Medicare Other | Admitting: Internal Medicine

## 2023-02-25 VITALS — BP 100/62 | HR 69 | Temp 97.7°F | Resp 16 | Ht 64.0 in | Wt 140.2 lb

## 2023-02-25 DIAGNOSIS — M940 Chondrocostal junction syndrome [Tietze]: Secondary | ICD-10-CM | POA: Diagnosis not present

## 2023-02-25 LAB — HM MAMMOGRAPHY

## 2023-02-25 MED ORDER — DEXAMETHASONE 4 MG PO TABS
ORAL_TABLET | ORAL | 0 refills | Status: AC
Start: 2023-02-25 — End: ?

## 2023-02-25 NOTE — Progress Notes (Signed)
Future Appointments  Date Time Provider Department  02/25/2023 11:00 AM Lucky Cowboy, MD [facility]  04/17/2023 10:00 AM Lucky Cowboy, MD [facility]  07/24/2023 10:00 AM Carlus Pavlov, MD [facility]    History of Present Illness:       Patient is followed expectantly  for labile HTN   &  BP has been controlled . Today's BP is at goal - 102/60.   In  2011,  she had a Nl heart echo & Heart cath (Dr Sharyn Lull).  In Mar 2022 she had a Negative Myoview  - Dr Sharyn Lull. Patient has had no complaints of any cardiac type chest pain, palpitations, dyspnea Pollyann Kennedy /PND, dizziness, claudication or dependent edema.      On 02/18/2023 , she went to ER  &  had a negative w/u and was dx's with Atypical Chest pain . Today she's c/o pain along the lower left sternal    Current Outpatient Medications on File Prior to Visit  Medication Sig   Acetaminophen (TYLENOL 8 HOUR PO) Take by mouth.   alum & mag hydroxide-simeth (GERI-LANTA) 200-200-20 MG/5ML suspension Take by mouth.   aspirin 81 MG tablet Take 81 mg by mouth daily.   Bacillus Coagulans-Inulin (ALIGN PREBIOTIC-PROBIOTIC PO) Take 1 tablet by mouth daily.   Cholecalciferol 125 MCG (5000 UT) capsule Take 5,000 Units by mouth daily. Taking 4,000 units   ezetimibe (ZETIA) 10 MG tablet Take 1 tablet Daily    Fluticasone Propionate (FLONASE ALLERGY RELIEF NA) Place 2 sprays into the nose as needed.   Hyoscyamine Sulfate (HYOSCYAMINE PO) Take by mouth as needed.   ibuprofen (ADVIL) 400 MG tablet Take 400 mg by mouth every 6 (six) hours as needed.   loratadine (CLARITIN) 10 MG tablet Take 1 tablet (10 mg total) by mouth 2 (two) times daily as needed for allergies (Can take an extra dose during flare ups.). Take 1 tablet 1-2 times daily   Lutein 20 MG TABS Take by mouth.   Magnesium 250 MG TABS    Multiple Vitamin Take 1 tablet by mouth daily.   NITROSTAT 0.4 MG SL tablet Place 0.4 mg under the tongue every 5 (five) minutes as needed for chest  pain.   Polyethylene Glycol 3350 (MIRALAX \ Take by mouth at bedtime.   RESTASIS 0.05 % ophthalmic emulsion instill 1 drop into both eyes twice a day   NASACORT nasal inhaler Place into the nose.   vitamin B-12 1000 MCG tablet Take 1,000 mcg by mouth 3 (three) times a week.     Allergies  Allergen Reactions   Bactrim [Sulfamethoxazole-Trimethoprim] Other (See Comments)    Patient preference to take medication without sulfa   Cephalexin Nausea And Vomiting    Rash; chest pain Rash; chest pain   Sulfa Antibiotics      Problem list She has GERD (gastroesophageal reflux disease); Vitamin D deficiency; Hyperlipidemia, mixed; Labile hypertension; Medication management; Abnormal glucose; Osteoporosis; Bigeminy; Upper airway cough syndrome; Fear of side effects of medication; Environmental allergies; Cholesterolosis of gallbladder; Irritable bowel syndrome with constipation; Pain in right knee; Neutropenia (HCC); and B12 deficiency on their problem list.   Observations/Objective:  BP 100/62   Pulse 69   Temp 97.7 F (36.5 C)   Resp 16   Ht 5\' 4"  (1.626 m)   Wt 140 lb 3.2 oz (63.6 kg)   SpO2 98%   BMI 24.07 kg/m   HEENT - WNL. Neck - supple.  Chest - Clear equal BS. (+) tender along the  lower Left Costo-Chondrial junctions. Cor - Nl HS. RRR w/o sig MGR. PP 1(+). No edema. MS- FROM w/o deformities.  Gait Nl. Neuro -  Nl w/o focal abnormalities.   Assessment and Plan:   1. Costochondritis  - dexamethasone  4 MG tablet;   Take 1 tab 3 x day - 3 days, then 2 x day - 3 days, then 1 tab daily   Dispense: 20 tablet; Refill: 0   Follow Up Instructions:        I discussed the assessment and treatment plan with the patient. The patient was provided an opportunity to ask questions and all were answered. The patient agreed with the plan and demonstrated an understanding of the instructions.       The patient was advised to call back or seek an in-person evaluation if the  symptoms worsen or if the condition fails to improve as anticipated.    Marinus Maw, MD

## 2023-02-25 NOTE — Patient Instructions (Signed)
Costochondritis  Costochondritis is inflammation of the tissue (cartilage) that connects the ribs to the breastbone (sternum). This causes pain in the front of the chest. The pain often starts slowly and involves more than one rib. What are the causes? This condition results from stress on the cartilage where your ribs attach to your sternum. The exact cause of this stress is not always known. The cause may be: Chest injury. Exercise or activity. This may include lifting. Severe coughing. What increases the risk? You are more likely to develop this condition if: You are female. You are 30-40 years old. You just started a new exercise or work activity. You have low levels of vitamin D. You have a condition that makes you cough often. What are the signs or symptoms? The main symptom of this condition is chest pain. The pain: Starts slowly and can be sharp or dull. Gets worse with deep breathing, coughing, or exercise. Gets better with rest. May be worse when you press on the affected area of your ribs and sternum. How is this diagnosed? This condition is diagnosed based on your symptoms, your medical history, and a physical exam. Your health care provider will check for pain when pressing on your sternum. You may also have tests to rule out other causes of chest pain. These may include: A chest X-ray. This may be done to check for lung problems. An electrocardiogram (ECG). This may be done to see if you have a heart problem that could be causing the pain. An imaging scan. This may be done to rule out a broken bone (fracture) in your chest or ribcage. How is this treated? This condition may go away on its own over time. Your health care provider may prescribe an NSAID, such as ibuprofen, to reduce pain and inflammation. Treatment may also include: Resting and avoiding activities that make pain worse. Putting heat or ice on the area to reduce pain and inflammation. Doing exercises to  stretch your chest muscles. If these treatments do not help, your health care provider may inject a numbing medicine at the spot where the sternum and rib connect. This can help relieve the pain. Follow these instructions at home: Managing pain, stiffness, and swelling     If directed, put ice on the painful area. To do this: Put ice in a plastic bag. Place a towel between your skin and the bag. Leave the ice on for 20 minutes, 2-3 times a day. If directed, apply heat to the affected area as often as told by your health care provider. Use the heat source that your health care provider recommends, such as a moist heat pack or a heating pad. Place a towel between your skin and the heat source. Leave the heat on for 20-30 minutes. If your skin turns bright red, remove the heat or ice right away to prevent skin damage. The risk of skin damage is higher if you cannot feel pain, heat, or cold. Activity Rest as told by your health care provider. Avoid activities that make pain worse. This includes activities that use the muscles in your chest, abdomen, and sides. You may have to avoid lifting. Ask your health care provider how much you can safely lift. Return to your normal activities as told by your health care provider. Ask your health care provider what activities are safe for you. General instructions Take over-the-counter and prescription medicines only as told by your health care provider. Contact a health care provider if: You have chills   or a fever. Your pain does not go away, or it gets worse. You have a cough that does not go away. Get help right away if: You feel short of breath. You have severe chest pain that does not get better with medicines, heat, or ice. These symptoms may be an emergency. Get help right away. Call 911. Do not wait to see if the symptoms will go away. Do not drive yourself to the hospital. This information is not intended to replace advice given to you by  your health care provider. Make sure you discuss any questions you have with your health care provider. Document Revised: 12/19/2021 Document Reviewed: 12/19/2021 Elsevier Patient Education  2024 [employer]  

## 2023-03-03 ENCOUNTER — Other Ambulatory Visit: Payer: Medicare Other

## 2023-03-16 ENCOUNTER — Other Ambulatory Visit: Payer: Self-pay

## 2023-03-16 ENCOUNTER — Ambulatory Visit: Payer: Medicare Other | Admitting: Nurse Practitioner

## 2023-03-16 ENCOUNTER — Emergency Department (HOSPITAL_BASED_OUTPATIENT_CLINIC_OR_DEPARTMENT_OTHER): Payer: Medicare Other | Admitting: Radiology

## 2023-03-16 ENCOUNTER — Encounter: Payer: Self-pay | Admitting: Nurse Practitioner

## 2023-03-16 ENCOUNTER — Emergency Department (HOSPITAL_BASED_OUTPATIENT_CLINIC_OR_DEPARTMENT_OTHER)
Admission: EM | Admit: 2023-03-16 | Discharge: 2023-03-16 | Disposition: A | Discharge status: Home / Self Care | Payer: Medicare Other | Attending: Emergency Medicine | Admitting: Emergency Medicine

## 2023-03-16 ENCOUNTER — Encounter (HOSPITAL_BASED_OUTPATIENT_CLINIC_OR_DEPARTMENT_OTHER): Payer: Self-pay | Admitting: *Deleted

## 2023-03-16 VITALS — BP 108/56 | HR 76 | Temp 97.3°F | Ht 64.0 in | Wt 134.0 lb

## 2023-03-16 DIAGNOSIS — R079 Chest pain, unspecified: Secondary | ICD-10-CM

## 2023-03-16 DIAGNOSIS — Z7982 Long term (current) use of aspirin: Secondary | ICD-10-CM | POA: Diagnosis not present

## 2023-03-16 DIAGNOSIS — R0989 Other specified symptoms and signs involving the circulatory and respiratory systems: Secondary | ICD-10-CM

## 2023-03-16 LAB — BASIC METABOLIC PANEL
Anion gap: 9 (ref 5–15)
BUN: 15 mg/dL (ref 8–23)
CO2: 27 mmol/L (ref 22–32)
Calcium: 9.8 mg/dL (ref 8.9–10.3)
Chloride: 103 mmol/L (ref 98–111)
Creatinine, Ser: 0.67 mg/dL (ref 0.44–1.00)
GFR, Estimated: >60 mL/min (ref >60)
Glucose, Bld: 98 mg/dL (ref 70–99)
Potassium: 3.8 mmol/L (ref 3.5–5.1)
Sodium: 139 mmol/L (ref 135–145)

## 2023-03-16 LAB — CBC
HCT: 38.4 % (ref 36.0–46.0)
Hemoglobin: 13.1 g/dL (ref 12.0–15.0)
MCH: 29 pg (ref 26.0–34.0)
MCHC: 34.1 g/dL (ref 30.0–36.0)
MCV: 85.1 fL (ref 80.0–100.0)
Platelets: 149 10*3/uL — ABNORMAL LOW (ref 150–400)
RBC: 4.51 MIL/uL (ref 3.87–5.11)
RDW: 14.4 % (ref 11.5–15.5)
WBC: 5.9 10*3/uL (ref 4.0–10.5)
nRBC: 0 % (ref 0.0–0.2)

## 2023-03-16 LAB — D-DIMER, QUANTITATIVE: D-Dimer, Quant: 0.38 ug{FEU}/mL (ref 0.00–0.50)

## 2023-03-16 LAB — TROPONIN I (HIGH SENSITIVITY)
Troponin I (High Sensitivity): <2 ng/L (ref <18)
Troponin I (High Sensitivity): <2 ng/L (ref <18)

## 2023-03-16 MED ORDER — CYCLOBENZAPRINE HCL 5 MG PO TABS
5.0000 mg | ORAL_TABLET | Freq: Two times a day (BID) | ORAL | 0 refills | Status: AC | PRN
Start: 2023-03-16 — End: 2023-04-15

## 2023-03-16 MED ORDER — CYCLOBENZAPRINE HCL 5 MG PO TABS
5.0000 mg | ORAL_TABLET | Freq: Once | ORAL | Status: AC
  Administered 2023-03-16: 5 mg via ORAL
  Filled 2023-03-16: qty 1

## 2023-03-16 MED ORDER — KETOROLAC TROMETHAMINE 30 MG/ML IJ SOLN
30.0000 mg | Freq: Once | INTRAMUSCULAR | Status: AC
  Administered 2023-03-16: 30 mg via INTRAVENOUS
  Filled 2023-03-16: qty 1

## 2023-03-16 NOTE — ED Triage Notes (Signed)
Pt here for evaluation of CP which is in central and left chest.  Pain increases with deep breathing.  No nausea or sob with this

## 2023-03-16 NOTE — ED Provider Notes (Signed)
[name] EMERGENCY DEPARTMENT AT [facility] Provider Note   CSN: [id number] Arrival date & time: 03/16/23  1625     History {Add pertinent medical, surgical, social history, OB history to HPI:1} Chief Complaint  Patient presents with   Chest Pain    [name] is a 72 y.o. female.  HPI      72yo female with history of hyperlipidemia, pre-DM  Right sided chest pain began this morning, radiation across front of chest. Tough to get a deep breath. Every once in a while will have a lull but this afternoon came back.  Worse with deep breaths, does feel like needs to take shallow breaths, can get it but feels pain.  Nausea, no vomiting. No abdominal pain.  Had surgery on foot Aug 2nd and in PT for that, does have some swelling around ankle since the surgery, not as much as what it was.  No fever. Headache for 2 weeks off and on, then yesterday had toothache.  No hx of dvt/pe,  went to [place] 2 weeks ago in car. Hx of surgery aug 2nd. Not on extrogen  Past Medical History:  Diagnosis Date   Arthritis    cervical and lumbar   Cataract    Mixed OU   Costochondritis 08/27/2020   Depression    GERD (gastroesophageal reflux disease)    Hyperlipidemia    IBS (irritable bowel syndrome)    Osteoporosis    Prediabetes    Vitamin D deficiency      Home Medications Prior to Admission medications   Medication Sig Start Date End Date Taking? Authorizing Provider  Acetaminophen (TYLENOL 8 HOUR PO) Take by mouth.    [provider]  alum & mag hydroxide-simeth Venida Jarvis) 200-200-20 MG/5ML suspension Take by mouth. 05/05/18   [provider]  aspirin 81 MG tablet Take 81 mg by mouth daily.    [provider]  Bacillus Coagulans-Inulin (ALIGN PREBIOTIC-PROBIOTIC PO) Take 1 tablet by mouth daily.    [provider]  Cholecalciferol 125 MCG (5000 UT) capsule Take 5,000 Units by mouth daily. Taking 4,000 units    [provider]   ezetimibe (ZETIA) 10 MG tablet Take 1 tablet Daily  for Cholesterol                              /                                                                   TAKE                                         BY                                                 MOUTH 11/17/22   Lucky Cowboy, MD  Fluticasone Propionate [facility] ALLERGY RELIEF NA) Place 2 sprays into the nose as needed.    [provider]  gabapentin (NEURONTIN) 100  MG capsule Take 100 mg by mouth 3 (three) times daily. BID    [provider]  Hyoscyamine Sulfate (HYOSCYAMINE PO) Take by mouth as needed.    [provider]  ibuprofen (ADVIL) 400 MG tablet Take 400 mg by mouth every 6 (six) hours as needed.    [provider]  loratadine (CLARITIN) 10 MG tablet Take 1 tablet (10 mg total) by mouth 2 (two) times daily as needed for allergies (Can take an extra dose during flare ups.). Take 1 tablet 1-2 times daily 10/08/21   Kozlow, Alvira Philips, MD  Lutein 20 MG TABS Take by mouth.    [provider]  Magnesium 250 MG TABS     [provider]  Multiple Vitamin (MULTIVITAMIN) tablet Take 1 tablet by mouth daily.    [provider]  nitroGLYCERIN (NITROSTAT) 0.4 MG SL tablet Place 0.4 mg under the tongue every 5 (five) minutes as needed for chest pain.    [provider]  Polyethylene Glycol 3350 (MIRALAX PO) Take by mouth at bedtime.    [provider]  RESTASIS 0.05 % ophthalmic emulsion instill 1 drop into both eyes twice a day 11/08/14   [provider]  triamcinolone (NASACORT) 55 MCG/ACT AERO nasal inhaler Place into the nose. 12/02/19   [provider]  vitamin B-12 (CYANOCOBALAMIN) 1000 MCG tablet Take 1,000 mcg by mouth 3 (three) times a week.    [provider]      Allergies    Bactrim [sulfamethoxazole-trimethoprim], Cephalexin, and Sulfa antibiotics    Review of Systems   Review of Systems  Physical Exam Updated Vital  Signs BP 129/82   Pulse 77   Temp 98.4 F (36.9 C) (Oral)   Resp 18   Ht 5' 4.5" (1.638 m)   Wt 62.1 kg   SpO2 100%   BMI 23.15 kg/m  Physical Exam  ED Results / Procedures / Treatments   Labs (all labs ordered are listed, but only abnormal results are displayed) Labs Reviewed  CBC - Abnormal; Notable for the following components:      Result Value   Platelets 149 (*)    All other components within normal limits  BASIC METABOLIC PANEL  TROPONIN I (HIGH SENSITIVITY)  TROPONIN I (HIGH SENSITIVITY)    EKG None  Radiology No results found.  Procedures Procedures  {Document cardiac monitor, telemetry assessment procedure when appropriate:1}  Medications Ordered in ED Medications - No data to display  ED Course/ Medical Decision Making/ A&P   {   Click here for ABCD2, HEART and other calculatorsREFRESH Note before signing :1}                              Medical Decision Making Amount and/or Complexity of Data Reviewed Labs: ordered. Radiology: ordered.   ***  {Document critical care time when appropriate:1} {Document review of labs and clinical decision tools ie heart score, Chads2Vasc2 etc:1}  {Document your independent review of radiology images, and any outside records:1} {Document your discussion with family members, caretakers, and with consultants:1} {Document social determinants of health affecting pt's care:1} {Document your decision making why or why not admission, treatments were needed:1} Final Clinical Impression(s) / ED Diagnoses Final diagnoses:  None    Rx / DC Orders ED Discharge Orders     None

## 2023-03-16 NOTE — Progress Notes (Signed)
Assessment and Plan:  [name] was seen today for spasms.  Diagnoses and all orders for this visit:  Labile hypertension - continue DASH diet, exercise and monitor at home. Call if greater than 130/80.   Chest pain at rest EKG is NSR, no st changes, could be costochondritis but has been treated with dexamethasone and states that improved- this pain is different Due to contributing symptoms of shortness of breath, jaw pain and headache she is sent to the er for further evaluation     Further disposition pending results of labs. Discussed med's effects and SE's.   Over 30 minutes of exam, counseling, chart review, and critical decision making was performed.   Future Appointments  Date Time Provider Department Center  04/17/2023 10:00 AM Jodi Cowboy, MD [id number] None  07/24/2023 10:00 AM Jodi Pavlov, MD [facility] None    ------------------------------------------------------------------------------------------------------------------   HPI BP (!) 108/56   Pulse 76   Temp (!) 97.3 F (36.3 C)   Ht 5\' 4"  (1.626 m)   Wt 134 lb (60.8 kg)   SpO2 96%   BMI 23.00 kg/m   72 y.o.female presents for Chest pain. Woke up this am around 8 am with aching pain in sternal region of chest and also down both sides of her chest.  Took some ibuprofen this am and it did help relieve the intensity a little. States she had some blurred vision , headache, pain in jaw, difficulty taking a deep breath . She denies nausea. She could not find her nitroglycerin to take.  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   BP well controlled without medication BP Readings from Last 3 Encounters:  03/16/23 (!) 108/56  02/25/23 100/62  02/18/23 (!) 106/52  Denies headaches, chest pain, shortness of breath and  dizziness   BMI is Body mass index is 23 kg/m., she has been working on diet and exercise. Wt Readings from Last 3 Encounters:  03/16/23 134 lb (60.8 kg)  02/25/23 140 lb 3.2 oz (63.6 kg)  02/18/23 139 lb (63 kg)    Past Medical History:  Diagnosis Date   Arthritis    cervical and lumbar   Cataract    Mixed OU   Costochondritis 08/27/2020   Depression    GERD (gastroesophageal reflux disease)    Hyperlipidemia    IBS (irritable bowel syndrome)    Osteoporosis    Prediabetes    Vitamin D deficiency      Allergies  Allergen Reactions   Bactrim [Sulfamethoxazole-Trimethoprim] Other (See Comments)    Patient preference to take medication without sulfa   Cephalexin Nausea And Vomiting    Rash; chest pain Rash; chest pain   Sulfa Antibiotics     Current Outpatient Medications on File Prior to Visit  Medication Sig   Acetaminophen (TYLENOL 8 HOUR PO) Take by mouth.   alum & mag hydroxide-simeth (GERI-LANTA) 200-200-20 MG/5ML suspension Take by mouth.   aspirin 81 MG tablet Take 81 mg by mouth daily.   Bacillus Coagulans-Inulin (ALIGN PREBIOTIC-PROBIOTIC PO) Take 1 tablet by mouth daily.   Cholecalciferol 125 MCG (5000 UT) capsule Take 5,000 Units by mouth daily. Taking 4,000 units   ezetimibe (ZETIA) 10 MG tablet Take 1 tablet Daily  for Cholesterol                              /  TAKE                                         BY                                                 MOUTH   Fluticasone Propionate (FLONASE ALLERGY RELIEF NA) Place 2 sprays into the nose as needed.   gabapentin (NEURONTIN) 100 MG capsule Take 100 mg by mouth 3 (three) times daily. BID   Hyoscyamine Sulfate (HYOSCYAMINE PO) Take by mouth as needed.   ibuprofen (ADVIL) 400 MG tablet Take 400 mg by mouth every 6 (six) hours as needed.   loratadine (CLARITIN) 10 MG tablet Take 1 tablet (10 mg total) by mouth 2 (two) times daily as needed for  allergies (Can take an extra dose during flare ups.). Take 1 tablet 1-2 times daily   Lutein 20 MG TABS Take by mouth.   Magnesium 250 MG TABS    Multiple Vitamin (MULTIVITAMIN) tablet Take 1 tablet by mouth daily.   nitroGLYCERIN (NITROSTAT) 0.4 MG SL tablet Place 0.4 mg under the tongue every 5 (five) minutes as needed for chest pain.   Polyethylene Glycol 3350 (MIRALAX PO) Take by mouth at bedtime.   RESTASIS 0.05 % ophthalmic emulsion instill 1 drop into both eyes twice a day   triamcinolone (NASACORT) 55 MCG/ACT AERO nasal inhaler Place into the nose.   vitamin B-12 (CYANOCOBALAMIN) 1000 MCG tablet Take 1,000 mcg by mouth 3 (three) times a week.   COVID-19 mRNA vaccine 2023-2024 (COMIRNATY) syringe Inject 0.3 mLs into the muscle. (Patient not taking: Reported on 03/16/2023)   dexamethasone (DECADRON) 4 MG tablet Take 1 tab 3 x day - 3 days, then 2 x day - 3 days, then 1 tab daily (Patient not taking: Reported on 03/16/2023)   No current facility-administered medications on file prior to visit.    ROS: all negative except above.   Physical Exam:  BP (!) 108/56   Pulse 76   Temp (!) 97.3 F (36.3 C)   Ht 5\' 4"  (1.626 m)   Wt 134 lb (60.8 kg)   SpO2 96%   BMI 23.00 kg/m   General Appearance: anxious appearing female Eyes: PERRLA, EOMs, conjunctiva no swelling or erythema Neck: Supple, thyroid normal.  Respiratory: Respiratory effort normal, BS equal bilaterally without rales, rhonchi, wheezing or stridor.  Cardio: RRR with no MRGs. Brisk peripheral pulses without edema.  Abdomen: Soft, + BS.   Lymphatics: Non tender without lymphadenopathy.  Musculoskeletal: Full ROM, 5/5 strength, antalgic gait Skin: Warm, dry . Right foot bandaged Neuro: Cranial nerves intact. Normal muscle tone, no cerebellar symptoms. Sensation intact.  Psych: Awake and oriented X 3, anxious affect, Insight and Judgment appropriate.  EKG: NSR no ST changes  Jodi Henney Hollie Salk, NP 3:55 PM [facility]

## 2023-03-16 NOTE — ED Notes (Signed)
ED Provider at bedside. 

## 2023-03-16 NOTE — ED Notes (Signed)
Pt. Given warm blanket per request.

## 2023-03-16 NOTE — Patient Instructions (Signed)
Go TO ER for evaluation  Nonspecific Chest Pain Chest pain can be caused by many different conditions. Some causes of chest pain can be life-threatening. These will require treatment right away. Serious causes of chest pain include: Heart attack. A tear in the body's main blood vessel. Redness and swelling (inflammation) around your heart. Blood clot in your lungs. Other causes of chest pain may not be so serious. These include: Heartburn. Anxiety or stress. Damage to bones or muscles in your chest. Lung infections. Chest pain can feel like: Pain or discomfort in your chest. Crushing, pressure, aching, or squeezing pain. Burning or tingling. Dull or sharp pain that is worse when you move, cough, or take a deep breath. Pain or discomfort that is also felt in your back, neck, jaw, shoulder, or arm, or pain that spreads to any of these areas. It is hard to know whether your pain is caused by something that is serious or something that is not so serious. So it is important to see your doctor right away if you have chest pain. Follow these instructions at home: Medicines Take over-the-counter and prescription medicines only as told by your doctor. If you were prescribed an antibiotic medicine, take it as told by your doctor. Do not stop taking the antibiotic even if you start to feel better. Lifestyle  Rest as told by your doctor. Do not use any products that contain nicotine or tobacco, such as cigarettes, e-cigarettes, and chewing tobacco. If you need help quitting, ask your doctor. Do not drink alcohol. Make lifestyle changes as told by your doctor. These may include: Getting regular exercise. Ask your doctor what activities are safe for you. Eating a heart-healthy diet. A diet and nutrition specialist (dietitian) can help you to learn healthy eating options. Staying at a healthy weight. Treating diabetes or high blood pressure, if needed. Lowering your stress. Activities such as yoga  and relaxation techniques can help. General instructions Pay attention to any changes in your symptoms. Tell your doctor about them or any new symptoms. Avoid any activities that cause chest pain. Keep all follow-up visits as told by your doctor. This is important. You may need more testing if your chest pain does not go away. Contact a doctor if: Your chest pain does not go away. You feel depressed. You have a fever. Get help right away if: Your chest pain is worse. You have a cough that gets worse, or you cough up blood. You have very bad (severe) pain in your belly (abdomen). You pass out (faint). You have either of these for no clear reason: Sudden chest discomfort. Sudden discomfort in your arms, back, neck, or jaw. You have shortness of breath at any time. You suddenly start to sweat, or your skin gets clammy. You feel sick to your stomach (nauseous). You throw up (vomit). You suddenly feel lightheaded or dizzy. You feel very weak or tired. Your heart starts to beat fast, or it feels like it is skipping beats. These symptoms may be an emergency. Do not wait to see if the symptoms will go away. Get medical help right away. Call your local emergency services (911 in the [place] Do not drive yourself to the hospital. Summary Chest pain can be caused by many different conditions. The cause may be serious and need treatment right away. If you have chest pain, see your doctor right away. Follow your doctor's instructions for taking medicines and making lifestyle changes. Keep all follow-up visits as told by your doctor.  This includes visits for any further testing if your chest pain does not go away. Be sure to know the signs that show that your condition has become worse. Get help right away if you have these symptoms. This information is not intended to replace advice given to you by your health care provider. Make sure you discuss any questions you have with your health care  provider. Document Revised: 04/17/2022 Document Reviewed: 04/17/2022 Elsevier Patient Education  2024 [employer].

## 2023-03-16 NOTE — ED Notes (Signed)
 RN reviewed discharge instructions with pt. Pt verbalized understanding and had no further questions. VSS upon discharge.  

## 2023-03-18 NOTE — Addendum Note (Signed)
Addended by: Anda Kraft E on: 03/18/2023 09:05 AM   Modules accepted: Orders

## 2023-04-01 NOTE — Progress Notes (Signed)
Jodi Abide, MD Reason for referral-chest pain  HPI: 72 year old female for evaluation of chest pain at request of Alvira Monday, MD.  Echocardiogram October 2019 showed normal LV function and mild mitral regurgitation.  Nuclear study March 2022 showed ejection fraction normal and no ischemia or infarction.  Patient seen September 4 in the emergency room with chest pain and troponin was normal.  Patient seen in the emergency room March 16, 2023 with chest pain.  D-dimer was normal as was troponins.  Hemoglobin 13.1.  Creatinine 0.67.  Pain felt likely musculoskeletal and cardiology asked to evaluate.  Patient states that the day she was seen in the emergency room she had substernal chest pain without radiation.  It was a dull pain and lasted most of the day.  It was not pleuritic.  Some increased with lying flat.  No associated symptoms.  After resolving she has had occasional 30-minute bouts of chest pain that are similar since then.  She does not have exertional chest pain.  There is no dyspnea on exertion, orthopnea, PND, palpitations or syncope.  Current Outpatient Medications  Medication Sig Dispense Refill   Acetaminophen (TYLENOL 8 HOUR PO) Take by mouth.     alum & mag hydroxide-simeth (GERI-LANTA) 200-200-20 MG/5ML suspension Take by mouth.     aspirin 81 MG tablet Take 81 mg by mouth daily.     Bacillus Coagulans-Inulin (ALIGN PREBIOTIC-PROBIOTIC PO) Take 1 tablet by mouth daily.     Cholecalciferol 125 MCG (5000 UT) capsule Take 5,000 Units by mouth daily. Taking 4,000 units     cyclobenzaprine (FLEXERIL) 5 MG tablet Take 1-2 tablets (5-10 mg total) by mouth 2 (two) times daily as needed for muscle spasms. 12 tablet 0   ezetimibe (ZETIA) 10 MG tablet Take 1 tablet Daily  for Cholesterol                              /                                                                   TAKE                                         BY                                                  MOUTH 90 tablet 3   Fluticasone Propionate (FLONASE ALLERGY RELIEF NA) Place 2 sprays into the nose as needed.     gabapentin (NEURONTIN) 100 MG capsule Take 100 mg by mouth 3 (three) times daily. BID     Hyoscyamine Sulfate (HYOSCYAMINE PO) Take by mouth as needed.     ibuprofen (ADVIL) 400 MG tablet Take 400 mg by mouth every 6 (six) hours as needed.     loratadine (CLARITIN) 10 MG tablet Take 1 tablet (10 mg total) by mouth 2 (two) times daily as needed for allergies (Can take an extra  dose during flare ups.). Take 1 tablet 1-2 times daily 60 tablet 11   Lutein 20 MG TABS Take by mouth.     Magnesium 250 MG TABS      Multiple Vitamin (MULTIVITAMIN) tablet Take 1 tablet by mouth daily.     nitroGLYCERIN (NITROSTAT) 0.4 MG SL tablet Place 0.4 mg under the tongue every 5 (five) minutes as needed for chest pain.     Polyethylene Glycol 3350 (MIRALAX PO) Take by mouth at bedtime.     RESTASIS 0.05 % ophthalmic emulsion instill 1 drop into both eyes twice a day  0   triamcinolone (NASACORT) 55 MCG/ACT AERO nasal inhaler Place into the nose.     vitamin B-12 (CYANOCOBALAMIN) 1000 MCG tablet Take 1,000 mcg by mouth 3 (three) times a week.     No current facility-administered medications for this visit.    Allergies  Allergen Reactions   Bactrim [Sulfamethoxazole-Trimethoprim] Other (See Comments)    Patient preference to take medication without sulfa   Cephalexin Nausea And Vomiting    Rash; chest pain Rash; chest pain   Sulfa Antibiotics      Past Medical History:  Diagnosis Date   Arthritis    cervical and lumbar   Cataract    Mixed OU   Costochondritis 08/27/2020   Depression    GERD (gastroesophageal reflux disease)    Hyperlipidemia    IBS (irritable bowel syndrome)    Osteoporosis    Prediabetes    Vitamin D deficiency     Past Surgical History:  Procedure Laterality Date   CARDIAC CATHETERIZATION  2011   normal (Dr. Sharyn Lull)   RADIOACTIVE SEED GUIDED  EXCISIONAL BREAST BIOPSY Right 03/11/2016   Procedure: RIGHT RADIOACTIVE SEED GUIDED EXCISIONAL BREAST BIOPSY;  Surgeon: Emelia Loron, MD;  Location: [facility];  Service: General;  Laterality: Right;    Social History   Socioeconomic History   Marital status: Married    Spouse name: Not on file   Number of children: 0   Years of education: Not on file   Highest education level: Not on file  Occupational History   Occupation: retired Mining engineer  Tobacco Use   Smoking status: Never    Passive exposure: Never   Smokeless tobacco: Never  Vaping Use   Vaping status: Never Used  Substance and Sexual Activity   Alcohol use: Yes    Comment: Rare   Drug use: No   Sexual activity: Yes    Birth control/protection: Post-menopausal    Comment: 1st intercourse 28 yo-5 partners  Other Topics Concern   Not on file  Social History Narrative   Lives at home with spouse   Right handed   Caffeine: not much, drinks decaf 2 cups/day   Social Determinants of Health   Financial Resource Strain: Not on file  Food Insecurity: Not on file  Transportation Needs: Not on file  Physical Activity: Not on file  Stress: Not on file  Social Connections: Not on file  Intimate Partner Violence: Not on file    Family History  Problem Relation Age of Onset   Hypertension Mother    Other Mother        surgeries involving upper back   Diabetes Father    Heart failure Father    Cancer Maternal Grandmother        Lung   Leukemia Maternal Aunt    Allergic rhinitis Neg Hx    Angioedema Neg Hx    Asthma  Neg Hx    Atopy Neg Hx    Eczema Neg Hx    Immunodeficiency Neg Hx    Urticaria Neg Hx    Migraines Neg Hx     ROS: no fevers or chills, productive cough, hemoptysis, dysphasia, odynophagia, melena, hematochezia, dysuria, hematuria, rash, seizure activity, orthopnea, PND, pedal edema, claudication. Remaining systems are negative.  Physical Exam:   Blood pressure  (!) 90/50, pulse (!) 55, height 5' 4.5" (1.638 m), weight 137 lb 6.4 oz (62.3 kg), SpO2 98%.  General:  Well developed/well nourished in NAD Skin warm/dry Patient not depressed No peripheral clubbing Back-normal HEENT-normal/normal eyelids Neck supple/normal carotid upstroke bilaterally; no bruits; no JVD; no thyromegaly chest - CTA/ normal expansion CV - RRR/normal S1 and S2; no murmurs, rubs or gallops;  PMI nondisplaced Abdomen -NT/ND, no HSM, no mass, + bowel sounds, no bruit 2+ femoral pulses, no bruits Ext-no edema, chords, 2+ DP Neuro-grossly nonfocal  ECG -February 18, 2023-sinus bradycardia with no ST changes.  Personally reviewed  March 16, 2023-sinus rhythm with no ST changes.  A/P  1 chest pain-symptoms somewhat atypical.  Electrocardiogram without ST changes.  I will arrange a coronary CTA to rule out obstructive coronary disease.  2 hyperlipidemia-continue Zetia.  Olga Millers, MD

## 2023-04-09 ENCOUNTER — Encounter: Payer: Medicare Other | Admitting: Internal Medicine

## 2023-04-14 ENCOUNTER — Ambulatory Visit: Payer: Medicare Other | Attending: Cardiology | Admitting: Cardiology

## 2023-04-14 ENCOUNTER — Encounter: Payer: Self-pay | Admitting: Cardiology

## 2023-04-14 VITALS — BP 90/50 | HR 55 | Ht 64.5 in | Wt 137.4 lb

## 2023-04-14 DIAGNOSIS — R072 Precordial pain: Secondary | ICD-10-CM | POA: Diagnosis not present

## 2023-04-14 DIAGNOSIS — E782 Mixed hyperlipidemia: Secondary | ICD-10-CM

## 2023-04-14 NOTE — Patient Instructions (Signed)
  Testing/Procedures:   Your cardiac CT will be scheduled at   [facility] [address] [place], [place] [address] [phone]   If scheduled at [facility], please arrive at the [facility] and Children's Entrance (Entrance C2) of [facility] 30 minutes prior to test start time. You can use the FREE valet parking offered at entrance C (encouraged to control the heart rate for the test)  Proceed to the [facility] Radiology Department (first floor) to check-in and test prep.  All radiology patients and guests should use entrance C2 at [facility], accessed from [facility], even though the hospital's physical address listed is [address].     Please follow these instructions carefully (unless otherwise directed):  An IV will be required for this test and Nitroglycerin will be given.  On the Night Before the Test: Be sure to Drink plenty of water. Do not consume any caffeinated/decaffeinated beverages or chocolate 12 hours prior to your test. Do not take any antihistamines 12 hours prior to your test.  On the Day of the Test: Drink plenty of water until 1 hour prior to the test. Do not eat any food 1 hour prior to test. You may take your regular medications prior to the test.   FEMALES- please wear underwire-free bra if available, avoid dresses & tight clothing  *Drink plenty of water. After receiving IV contrast, you may experience a mild flushed feeling. This is normal. On occasion, you may experience a mild rash up to 24 hours after the test. This is not dangerous. If this occurs, you can take Benadryl 25 mg and increase your fluid intake. If you experience trouble breathing, this can be serious. If it is severe call 911 IMMEDIATELY. If it is mild, please call our office.  We will call to schedule your test 2-4 weeks out understanding that some insurance companies will need an authorization prior to the service  being performed.   For more information and frequently asked questions, please visit our website : [email or web address]  For non-scheduling related questions, please contact the cardiac imaging nurse navigator should you have any questions/concerns: Cardiac Imaging Nurse Navigators Direct Office Dial: [phone]   For scheduling needs, including cancellations and rescheduling, please call [place], [phone].    Follow-Up: At [facility], you and your health needs are our priority.  As part of our continuing mission to provide you with exceptional heart care, we have created designated Provider Care Teams.  These Care Teams include your primary Cardiologist (physician) and Advanced Practice Providers (APPs -  Physician Assistants and Nurse Practitioners) who all work together to provide you with the care you need, when you need it.  We recommend signing up for the patient portal called "MyChart".  Sign up information is provided on this After Visit Summary.  MyChart is used to connect with patients for Virtual Visits (Telemedicine).  Patients are able to view lab/test results, encounter notes, upcoming appointments, etc.  Non-urgent messages can be sent to your provider as well.   To learn more about what you can do with MyChart, go to [email or web address].    Your next appointment:   As Needed.  Provider:   Olga Millers MD

## 2023-04-16 ENCOUNTER — Encounter: Payer: Self-pay | Admitting: Internal Medicine

## 2023-04-16 NOTE — Progress Notes (Unsigned)
Annual Screening/Preventative Visit & Comprehensive Evaluation &  Examination   Future Appointments  Date Time Provider Department  04/17/2023 10:00 AM Lucky Cowboy, MD [facility]  07/24/2023 10:00 AM Carlus Pavlov, MD [facility]  04/28/2024 10:00 AM Lucky Cowboy, MD [facility]        This very nice 72 y.o. MBF  with  HTN, HLD, Prediabetes  and Vitamin D Deficiency presents for a Screening /Preventative Visit & comprehensive evaluation and management of multiple medical co-morbidities.  Patient is followed by Dr Kinnie Scales for IBS-C & GERD. She has been seen by Dr's Wert & Kozlow for upper airway cough syndrome and recommended omeprazole 40 mg in AM, famotidine 40 mg in PM.   Patient has hx/o Osteoporosis by dexaBMD (T-3.1 on 03/05/2022)  and declines taking meds.  Patient is also followed by Dr Elvera Lennox for Osteoporosis.              Patient has been followed for labile HTN.   Patient's BP has been controlled at home .  She had a Nl  heart echo & Heart cath in 2011 and a  Negative Myoview  in 2022 .  She was evaluated recently in the ER for vague CP and released & was then subsequently seen by Dr Jens Som who has scheduled her for a Coronary CTA .    Patient denies any exertional type cardiac symptoms as chest pain, palpitations, shortness of breath or ankle swelling. Today's BP is  at goal -   114/70 .          Patient's hyperlipidemia is near controlled with diet and Ezetimibe and she is reticent to take statins.   Patient denies myalgias or other medication SE's. Last lipids were at goal :  Lab Results  Component Value Date   CHOL 180 01/08/2023   HDL 78 01/08/2023   LDLCALC 88 01/08/2023   TRIG 49 01/08/2023   CHOLHDL 2.3 01/08/2023         Patient has hx/o prediabetes predating (A1c 5.7% /2014 & 6.1% /2016) and patient denies reactive hypoglycemic symptoms, visual blurring, diabetic polys or paresthesias. Last A1c was near  goal :  Lab Results  Component Value  Date   HGBA1C 5.8 (H) 01/08/2023         Finally, patient has history of Vitamin D Deficiency and last Vitamin D was at goal :  Lab Results  Component Value Date   VD25OH 66 01/08/2023       Current Outpatient Medications  Medication Instructions   Acetaminophen ( 8 HR) Oral   alum & mag hydroxide-simeth (GERI-LANTA) 200-200-20 MG/5ML susp Oral   Aspirin  81 mg , Daily   ALIGN PRE-PROBIOTIC  1 tablet, Oral, Daily   Cholecalciferol Taking 4,000 units Daily   VITAMIN B12 1,000 mcg, Oral, 3 times weekly   ezetimibe ( 10 MG tablet Take 1 tablet Daily    FLONASE  2 sprays, Nasal, As needed   gabapentin   100 mg 3 times daily /  BID   Hyoscyamine  As needed   ibuprofen 400 mg Every 6 hours PRN   loratadine  10 mg  Take 1 tablet 1-2 times daily   Lutein 20 MG TABS Take daily   Magnesium 250 MG TABS    Multiple Vitamin 1 tablet Daily   NITROSTAT0.4 mg  Sl  Every 5 min PRN   chest pain   Polyethylene Glycol / MIRALAX Daily at bedtime   RESTASIS 0.05 % ophth emulsion instill  both eyes twice a day   NASACORT nasal inhaler Nasal     Allergies  Allergen Reactions   Bactrim [Sulfamethoxazole-Trimethoprim] Other (See Comments)    Patient preference to take medication without sulfa   Cephalexin Nausea And Vomiting    Rash; chest pain     Past Medical History:  Diagnosis Date   Arthritis    cervical and lumbar   Cataract    Mixed OU   Depression    GERD (gastroesophageal reflux disease)    Hyperlipidemia    IBS (irritable bowel syndrome)    Osteoporosis    Prediabetes    Vitamin D deficiency      Health Maintenance  Topic Date Due   Zoster Vaccines- Shingrix (1 of 2) Never done   INFLUENZA VACCINE  01/14/2021   COVID-19 Vaccine (5 - Booster for Pfizer series) 04/28/2021   MAMMOGRAM  03/08/2022   TETANUS/TDAP  03/28/2029   DEXA SCAN  Completed   Hepatitis C Screening  Completed   HPV VACCINES  Aged Out     Immunization History  Administered Date(s)  Administered   Influenza, High Dose  06/25/2017   Influenz 03/24/2018, 03/29/2019, 03/05/2020   PFIZER Covid-19 Tri-Sucrose Vacc 12/26/2020   PFIZER SARS-COV-2 Vacc 07/08/2019, 07/29/2019, 03/20/2020   PPD Test 12/12/2014, 12/26/2015, 01/15/2017   Pneumococcal -13 11/10/2018   Td 05/18/2007   Zoster, Live 05/17/2006    Last Colon - 10/18/2018 - Normal - Dr Kinnie Scales - Recc 10 yr f/u - due May 2030   Last MGM - 12/09/2022   Past Surgical History:  Procedure Laterality Date   CARDIAC CATHETERIZATION  2011   normal (Dr. Sharyn Lull)   RADIOACTIVE SEED GUIDED EXCISIONAL BREAST BIOPSY Right 03/11/2016   Procedure: RIGHT RADIOACTIVE SEED GUIDED EXCISIONAL BREAST BIOPSY;  Surgeon: Emelia Loron, MD;  Location: [facility];  Service: General;  Laterality: Right;     Family History  Problem Relation Age of Onset   Hypertension Mother    Other Mother        surgeries involving upper back   Diabetes Father    Heart failure Father    Cancer Maternal Grandmother        Lung   Allergic rhinitis Neg Hx    Angioedema Neg Hx    Asthma Neg Hx    Atopy Neg Hx    Eczema Neg Hx    Immunodeficiency Neg Hx    Urticaria Neg Hx      Social History   Tobacco Use   Smoking status: Never   Smokeless tobacco: Never  Vaping Use   Vaping Use: Never used  Substance Use Topics   Alcohol use: Not Currently   Drug use: No      ROS Constitutional: Denies fever, chills, weight loss/gain, headaches, insomnia,  night sweats, and change in appetite. Does c/o fatigue. Eyes: Denies redness, blurred vision, diplopia, discharge, itchy, watery eyes.  ENT: Denies discharge, congestion, post nasal drip, epistaxis, sore throat, earache, hearing loss, dental pain, Tinnitus, Vertigo, Sinus pain, snoring.  Cardio: Denies chest pain, palpitations, irregular heartbeat, syncope, dyspnea, diaphoresis, orthopnea, PND, claudication, edema Respiratory: denies cough, dyspnea, DOE, pleurisy, hoarseness,  laryngitis, wheezing.  Gastrointestinal: Denies dysphagia, heartburn, reflux, water brash, pain, cramps, nausea, vomiting, bloating, diarrhea, constipation, hematemesis, melena, hematochezia, jaundice, hemorrhoids Genitourinary: Denies dysuria, frequency, urgency, nocturia, hesitancy, discharge, hematuria, flank pain Breast: Breast lumps, nipple discharge, bleeding.  Musculoskeletal: Denies arthralgia, myalgia, stiffness, Jt. Swelling, pain, limp, and strain/sprain. Denies falls. Skin: Denies puritis, rash,  hives, warts, acne, eczema, changing in skin lesion, All # 10 toes with dystropic, very thicked & discolored toe nails.  Neuro: No weakness, tremor, incoordination, spasms, paresthesia, pain Psychiatric: Denies confusion, memory loss, sensory loss. Denies Depression. Endocrine: Denies change in weight, skin, hair change, nocturia, and paresthesia, diabetic polys, visual blurring, hyper / hypo glycemic episodes.  Heme/Lymph: No excessive bleeding, bruising, enlarged lymph nodes.  Physical Exam  BP 114/70   Pulse 66   Temp 97.9 F (36.6 C)   Resp 16   Ht 5' 4.5" (1.638 m)   Wt 139 lb 6.4 oz (63.2 kg)   SpO2 98%   BMI 23.56 kg/m   General Appearance: Well nourished, well groomed and in no apparent distress.  Eyes: PERRLA, EOMs, conjunctiva no swelling or erythema, normal fundi and vessels. Sinuses: No frontal/maxillary tenderness ENT/Mouth: EACs patent / TMs  nl. Nares clear without erythema, swelling, mucoid exudates. Oral hygiene is good. No erythema, swelling, or exudate. Tongue normal, non-obstructing. Tonsils not swollen or erythematous. Hearing normal.  Neck: Supple, thyroid not palpable. No bruits, nodes or JVD. Respiratory: Respiratory effort normal.  BS equal and clear bilateral without rales, rhonci, wheezing or stridor. Cardio: Heart sounds are normal with regular rate and rhythm and no murmurs, rubs or gallops. Peripheral pulses are normal and equal bilaterally without  edema. No aortic or femoral bruits. Chest: symmetric with normal excursions and percussion. Breasts: Symmetric, without lumps, nipple discharge, retractions, or fibrocystic changes.  Abdomen: Flat, soft with bowel sounds active. Nontender, no guarding, rebound, hernias, masses, or organomegaly.  Lymphatics: Non tender without lymphadenopathy.  Musculoskeletal: Full ROM all peripheral extremities, joint stability, 5/5 strength, and normal gait. Skin: Warm and dry without rashes, lesions, cyanosis, clubbing or  ecchymosis.  Neuro: Cranial nerves intact, reflexes equal bilaterally. Normal muscle tone, no cerebellar symptoms. Sensation intact.  Pysch: Alert and oriented X 3, normal affect, Insight and Judgment appropriate.    Assessment and Plan  1. Annual Preventative Screening Examination   2. Labile hypertension  - Urinalysis, Routine w reflex microscopic - CBC with Differential/Platelet - COMPLETE METABOLIC PANEL WITH GFR - Magnesium - VITAMIN D 25 Hydroxy  - Microalbumin / creatinine urine ratio   3. Hyperlipidemia, mixed  - Lipid panel   4. Abnormal glucose  - Hemoglobin A1c - Insulin, random   5. Vitamin D deficiency  - VITAMIN D 25 Hydroxy   6. Osteoporosis without current pathological fracture, unspecified osteoporosis type   7. Gastroesophageal reflux disease, unspecified whether esophagitis present  - CBC with Differential/Platelet   8. Irritable bowel syndrome with both constipation and diarrhea   9. Screening for colorectal cancer  - POC Hemoccult Bld/Stl   10. Medication management [Z79.899]  - Urinalysis, Routine w reflex microscopic - CBC with Differential/Platelet - COMPLETE METABOLIC PANEL WITH GFR - Magnesium - Lipid panel - Hemoglobin A1c - Insulin, random - VITAMIN D 25 Hydroxy - Microalbumin / creatinine urine ratio           Patient was counseled in prudent diet to achieve/maintain BMI less than 25 for weight control, BP  monitoring, regular exercise and medications. Discussed med's effects and SE's. Screening labs and tests as requested with regular follow-up as recommended. Over 40 minutes of exam, counseling, chart review and high complex critical decision making was performed.   Marinus Maw, MD

## 2023-04-16 NOTE — Patient Instructions (Signed)

## 2023-04-17 ENCOUNTER — Ambulatory Visit (INDEPENDENT_AMBULATORY_CARE_PROVIDER_SITE_OTHER): Payer: Medicare Other | Admitting: Internal Medicine

## 2023-04-17 ENCOUNTER — Encounter: Payer: Self-pay | Admitting: Internal Medicine

## 2023-04-17 VITALS — BP 114/70 | HR 66 | Temp 97.9°F | Resp 16 | Ht 64.5 in | Wt 139.4 lb

## 2023-04-17 DIAGNOSIS — Z1211 Encounter for screening for malignant neoplasm of colon: Secondary | ICD-10-CM

## 2023-04-17 DIAGNOSIS — K582 Mixed irritable bowel syndrome: Secondary | ICD-10-CM

## 2023-04-17 DIAGNOSIS — E782 Mixed hyperlipidemia: Secondary | ICD-10-CM

## 2023-04-17 DIAGNOSIS — R0989 Other specified symptoms and signs involving the circulatory and respiratory systems: Secondary | ICD-10-CM | POA: Diagnosis not present

## 2023-04-17 DIAGNOSIS — Z0000 Encounter for general adult medical examination without abnormal findings: Secondary | ICD-10-CM | POA: Diagnosis not present

## 2023-04-17 DIAGNOSIS — Z136 Encounter for screening for cardiovascular disorders: Secondary | ICD-10-CM | POA: Diagnosis not present

## 2023-04-17 DIAGNOSIS — Z0001 Encounter for general adult medical examination with abnormal findings: Secondary | ICD-10-CM

## 2023-04-17 DIAGNOSIS — E559 Vitamin D deficiency, unspecified: Secondary | ICD-10-CM

## 2023-04-17 DIAGNOSIS — Z79899 Other long term (current) drug therapy: Secondary | ICD-10-CM

## 2023-04-17 DIAGNOSIS — K219 Gastro-esophageal reflux disease without esophagitis: Secondary | ICD-10-CM

## 2023-04-17 DIAGNOSIS — R7309 Other abnormal glucose: Secondary | ICD-10-CM

## 2023-04-17 DIAGNOSIS — M810 Age-related osteoporosis without current pathological fracture: Secondary | ICD-10-CM

## 2023-04-17 MED ORDER — CYCLOBENZAPRINE HCL 10 MG PO TABS: ORAL_TABLET | ORAL | 3 refills | Status: DC

## 2023-04-18 ENCOUNTER — Encounter: Payer: Self-pay | Admitting: Internal Medicine

## 2023-04-18 NOTE — Progress Notes (Signed)
<>*<>*<>*<>*<>*<>*<>*<>*<>*<>*<>*<>*<>*<>*<>*<>*<>*<>*<>*<>*<>*<>*<>*<>*<> <>*<>*<>*<>*<>*<>*<>*<>*<>*<>*<>*<>*<>*<>*<>*<>*<>*<>*<>*<>*<>*<>*<>*<>*<>  -  Test results slightly outside the reference range are not unusual. If there is anything important, I will review this with you,  otherwise it is considered normal test values.  If you have further questions,  please do not hesitate to contact me at the office or via My Chart.   <>*<>*<>*<>*<>*<>*<>*<>*<>*<>*<>*<>*<>*<>*<>*<>*<>*<>*<>*<>*<>*<>*<>*<>*<> <>*<>*<>*<>*<>*<>*<>*<>*<>*<>*<>*<>*<>*<>*<>*<>*<>*<>*<>*<>*<>*<>*<>*<>*<>  -  Mild chronic anemia is about the same - will continue to monitor   <>*<>*<>*<>*<>*<>*<>*<>*<>*<>*<>*<>*<>*<>*<>*<>*<>*<>*<>*<>*<>*<>*<>*<>*<> <>*<>*<>*<>*<>*<>*<>*<>*<>*<>*<>*<>*<>*<>*<>*<>*<>*<>*<>*<>*<>*<>*<>*<>*<>  -  A1c = 5.7% - Borderline elevated sugar.  - Avoid Sweets, Candy & White Stuff   - White Rice, White [place], White Flour  - Breads &  Pasta\  <>*<>*<>*<>*<>*<>*<>*<>*<>*<>*<>*<>*<>*<>*<>*<>*<>*<>*<>*<>*<>*<>*<>*<>*<> <>*<>*<>*<>*<>*<>*<>*<>*<>*<>*<>*<>*<>*<>*<>*<>*<>*<>*<>*<>*<>*<>*<>*<>*<>  -  Chol = 180  Excellent   - Very low risk for Heart Attack  / Stroke  <>*<>*<>*<>*<>*<>*<>*<>*<>*<>*<>*<>*<>*<>*<>*<>*<>*<>*<>*<>*<>*<>*<>*<>*<> <>*<>*<>*<>*<>*<>*<>*<>*<>*<>*<>*<>*<>*<>*<>*<>*<>*<>*<>*<>*<>*<>*<>*<>*<>  -  Vitamin D= 70  - Great  !                    please keep dosage same   <>*<>*<>*<>*<>*<>*<>*<>*<>*<>*<>*<>*<>*<>*<>*<>*<>*<>*<>*<>*<>*<>*<>*<>*<> <>*<>*<>*<>*<>*<>*<>*<>*<>*<>*<>*<>*<>*<>*<>*<>*<>*<>*<>*<>*<>*<>*<>*<>*<>  -  All Else - CBC - Kidneys - Electrolytes - Liver - Magnesium & Thyroid    - all  Normal / OK  <>*<>*<>*<>*<>*<>*<>*<>*<>*<>*<>*<>*<>*<>*<>*<>*<>*<>*<>*<>*<>*<>*<>*<>*<> <>*<>*<>*<>*<>*<>*<>*<>*<>*<>*<>*<>*<>*<>*<>*<>*<>*<>*<>*<>*<>*<>*<>*<>*<>

## 2023-04-19 LAB — COMPLETE METABOLIC PANEL WITH GFR
AG Ratio: 1.6 (calc) (ref 1.0–2.5)
ALT: 9 U/L (ref 6–29)
AST: 16 U/L (ref 10–35)
Albumin: 3.9 g/dL (ref 3.6–5.1)
Alkaline phosphatase (APISO): 85 U/L (ref 37–153)
BUN: 9 mg/dL (ref 7–25)
CO2: 28 mmol/L (ref 20–32)
Calcium: 9.6 mg/dL (ref 8.6–10.4)
Chloride: 105 mmol/L (ref 98–110)
Creat: 0.71 mg/dL (ref 0.60–1.00)
Globulin: 2.5 g/dL (ref 1.9–3.7)
Glucose, Bld: 84 mg/dL (ref 65–99)
Potassium: 4.6 mmol/L (ref 3.5–5.3)
Sodium: 139 mmol/L (ref 135–146)
Total Bilirubin: 0.5 mg/dL (ref 0.2–1.2)
Total Protein: 6.4 g/dL (ref 6.1–8.1)
eGFR: 91 mL/min/{1.73_m2} (ref >=60)

## 2023-04-19 LAB — LIPID PANEL
Cholesterol: 180 mg/dL (ref <200)
HDL: 76 mg/dL (ref >=50)
LDL Cholesterol (Calc): 90 mg/dL
Non-HDL Cholesterol (Calc): 104 mg/dL (ref <130)
Total CHOL/HDL Ratio: 2.4 (calc) (ref <5.0)
Triglycerides: 55 mg/dL (ref <150)

## 2023-04-19 LAB — VITAMIN D 25 HYDROXY (VIT D DEFICIENCY, FRACTURES): Vit D, 25-Hydroxy: 70 ng/mL (ref 30–100)

## 2023-04-19 LAB — CBC WITH DIFFERENTIAL/PLATELET
Absolute Lymphocytes: 1204 {cells}/uL (ref 850–3900)
Absolute Monocytes: 284 {cells}/uL (ref 200–950)
Basophils Absolute: 19 {cells}/uL (ref 0–200)
Basophils Relative: 0.7 %
Eosinophils Absolute: 70 {cells}/uL (ref 15–500)
Eosinophils Relative: 2.6 %
HCT: 34.8 % — ABNORMAL LOW (ref 35.0–45.0)
Hemoglobin: 11.6 g/dL — ABNORMAL LOW (ref 11.7–15.5)
MCH: 29.3 pg (ref 27.0–33.0)
MCHC: 33.3 g/dL (ref 32.0–36.0)
MCV: 87.9 fL (ref 80.0–100.0)
MPV: 11.6 fL (ref 7.5–12.5)
Monocytes Relative: 10.5 %
Neutro Abs: 1123 {cells}/uL — ABNORMAL LOW (ref 1500–7800)
Neutrophils Relative %: 41.6 %
Platelets: 173 10*3/uL (ref 140–400)
RBC: 3.96 10*6/uL (ref 3.80–5.10)
RDW: 13.5 % (ref 11.0–15.0)
Total Lymphocyte: 44.6 %
WBC: 2.7 10*3/uL — ABNORMAL LOW (ref 3.8–10.8)

## 2023-04-19 LAB — URINALYSIS, ROUTINE W REFLEX MICROSCOPIC
Bilirubin Urine: NEGATIVE
Glucose, UA: NEGATIVE
Hgb urine dipstick: NEGATIVE
Ketones, ur: NEGATIVE
Leukocytes,Ua: NEGATIVE
Nitrite: NEGATIVE
Protein, ur: NEGATIVE
Specific Gravity, Urine: 1.005 (ref 1.001–1.035)
pH: 7.5 (ref 5.0–8.0)

## 2023-04-19 LAB — HEMOGLOBIN A1C
Hgb A1c MFr Bld: 5.7 %{Hb} — ABNORMAL HIGH (ref <5.7)
Mean Plasma Glucose: 117 mg/dL
eAG (mmol/L): 6.5 mmol/L

## 2023-04-19 LAB — MICROALBUMIN / CREATININE URINE RATIO
Creatinine, Urine: 25 mg/dL (ref 20–275)
Microalb Creat Ratio: 8 mg/g{creat} (ref <30)
Microalb, Ur: 0.2 mg/dL

## 2023-04-19 LAB — INSULIN, RANDOM: Insulin: 3.4 u[IU]/mL

## 2023-04-19 LAB — MAGNESIUM: Magnesium: 2.1 mg/dL (ref 1.5–2.5)

## 2023-04-29 NOTE — Progress Notes (Signed)
GYNECOLOGY  VISIT   HPI: 72 y.o.   Married  [place] female   G0P0000 with No LMP recorded. Patient is postmenopausal.   here for: cramping. Has had this cramping for 2-3 months. Has noticed yellowish/brown discharge, which is infrequent.  This has come and gone.  Some odor noted.  Some pink discharge after intercourse.  Has dryness.  Decreased desire.  No real urgency or frequency to empty her bladder.   Some pain radiating down her abdomen and into her legs.  Does have IBS.  Hx postcoital bleeding.  Had pelvic US 10/08/21 showing 4 small fibroids, EMS 1.27 mm, normal ovaries. Follow up US 03/12/22 showed similar findings and small fluid in endometrial canal. Her EMB 03/19/22 showed benign inactive endometrium.  Her dx was atrophy.   GYNECOLOGIC HISTORY: No LMP recorded. Patient is postmenopausal. 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   Environmental allergies 04/24/2020   Irritable bowel syndrome with constipation 09/02/2019   Fear of side effects of medication 08/28/2019   Upper airway cough syndrome 05/04/2019   Pain in right knee 12/27/2018   Bigeminy 03/25/2018   Osteoporosis 08/11/2017   Abnormal glucose 06/25/2017   Medication management 12/12/2014   Labile hypertension 08/09/2014   GERD (gastroesophageal reflux disease)    Vitamin D deficiency    Hyperlipidemia, mixed     Past Medical History:  Diagnosis Date   Arthritis     cervical and lumbar   Cataract    Mixed OU   Costochondritis 08/27/2020   Depression    GERD (gastroesophageal reflux disease)    Hyperlipidemia    IBS (irritable bowel syndrome)    Osteoporosis    Prediabetes    Vitamin D deficiency     Past Surgical History:  Procedure Laterality Date   CARDIAC CATHETERIZATION  2011   normal (Dr. Sharyn Lull)   FOOT SURGERY Right 02/2023   Removal of bunion and hammer toe   RADIOACTIVE SEED GUIDED EXCISIONAL BREAST BIOPSY Right 03/11/2016   Procedure: RIGHT RADIOACTIVE SEED GUIDED EXCISIONAL BREAST BIOPSY;  Surgeon: Emelia Loron, MD;  Location: [facility];  Service: General;  Laterality: Right;    Current Outpatient Medications  Medication Sig Dispense Refill   Acetaminophen (TYLENOL 8 HOUR PO) Take by mouth.     alum & mag hydroxide-simeth (GERI-LANTA) 200-200-20 MG/5ML suspension Take by mouth.     aspirin 81 MG tablet Take 81 mg by mouth daily.     Bacillus Coagulans-Inulin (ALIGN PREBIOTIC-PROBIOTIC PO) Take 1 tablet by mouth daily.     Cholecalciferol 125 MCG (5000 UT) capsule Take 5,000 Units by mouth daily. Taking 4,000 units     cyclobenzaprine (FLEXERIL) 10 MG  tablet Take 1/2 to 1 tablet 3 x /day as needed for Muscle Spasm                                /                                                                   TAKE                                         BY                                                 MOUTH 90 tablet 3   ezetimibe (ZETIA) 10 MG tablet Take 1 tablet Daily  for Cholesterol                              /                                                                   TAKE                                         BY                                                 MOUTH 90 tablet 3   Fluticasone Propionate (FLONASE ALLERGY RELIEF NA) Place 2 sprays into the nose as needed.     gabapentin (NEURONTIN) 100 MG capsule Take 100 mg by mouth 3 (three) times daily. BID     Hyoscyamine Sulfate (HYOSCYAMINE  PO) Take by mouth as needed.     ibuprofen (ADVIL) 400 MG tablet Take 400 mg by mouth every 6 (six) hours as needed.     loratadine (CLARITIN) 10 MG tablet Take 1 tablet (10 mg total) by mouth 2 (two) times daily as needed for allergies (Can take an extra dose during flare ups.). Take 1 tablet 1-2 times daily 60 tablet 11   Lutein 20 MG TABS Take by mouth.     Magnesium 250 MG TABS      Multiple Vitamin (MULTIVITAMIN) tablet Take 1 tablet by mouth daily.     nitroGLYCERIN (NITROSTAT) 0.4 MG SL tablet Place 0.4 mg under the tongue every 5 (five) minutes as needed for chest pain.     Polyethylene Glycol 3350 (MIRALAX PO) Take by mouth at bedtime.     RESTASIS 0.05 % ophthalmic  emulsion instill 1 drop into both eyes twice a day  0   triamcinolone (NASACORT) 55 MCG/ACT AERO nasal inhaler Place into the nose.     vitamin B-12 (CYANOCOBALAMIN) 1000 MCG tablet Take 1,000 mcg by mouth 3 (three) times a week.     No current facility-administered medications for this visit.     ALLERGIES: Bactrim [sulfamethoxazole-trimethoprim], Cephalexin, and Sulfa antibiotics  Family History  Problem Relation Age of Onset   Hypertension Mother    Other Mother        surgeries involving upper back   Diabetes Father    Heart failure Father    Cancer Maternal Grandmother        Lung   Leukemia Maternal Aunt    Allergic rhinitis Neg Hx    Angioedema Neg Hx    Asthma Neg Hx    Atopy Neg Hx    Eczema Neg Hx    Immunodeficiency Neg Hx    Urticaria Neg Hx    Migraines Neg Hx     Social History   Socioeconomic History   Marital status: Married    Spouse name: Not on file   Number of children: 0   Years of education: Not on file   Highest education level: Not on file  Occupational History   Occupation: retired Mining engineer  Tobacco Use   Smoking status: Never    Passive exposure: Never   Smokeless tobacco: Never  Vaping Use   Vaping status: Never Used  Substance and Sexual Activity    Alcohol use: Yes    Comment: Rare   Drug use: No   Sexual activity: Yes    Birth control/protection: Post-menopausal    Comment: 1st intercourse 63 yo-5 partners  Other Topics Concern   Not on file  Social History Narrative   Lives at home with spouse   Right handed   Caffeine: not much, drinks decaf 2 cups/day   Social Determinants of Health   Financial Resource Strain: Not on file  Food Insecurity: Low Risk  (04/30/2023)   Received from [facility]   Hunger Vital Sign    Worried About Running Out of Food in the Last Year: Never true    Ran Out of Food in the Last Year: Never true  Transportation Needs: No Transportation Needs (04/30/2023)   Received from [facility]    In the past 12 months, has lack of reliable transportation kept you from medical appointments, meetings, work or from getting things needed for daily living? : No  Physical Activity: Not on file  Stress: Not on file  Social Connections: Not on file  Intimate Partner Violence: Not on file    Review of Systems  Genitourinary:  Positive for pelvic pain and vaginal discharge.  All other systems reviewed and are negative.   PHYSICAL EXAMINATION:   BP 114/68 (BP Location: Left Arm, Patient Position: Sitting, Cuff Size: Normal)   Pulse 77   Ht 5' 5.75" (1.67 m)   Wt 140 lb (63.5 kg)   SpO2 99%   BMI 22.77 kg/m     General appearance: alert, cooperative and appears stated age   Pelvic: External genitalia:  no lesions              Urethra:  normal appearing urethra with no masses, tenderness or lesions              Bartholins and Skenes: normal  Vagina: normal appearing vagina with normal color and discharge, no lesions.  Atrophy and bleeding noted after placement of speculum.              Cervix: no lesions                Bimanual Exam:  Uterus:  normal size, contour, position, consistency, mobility, non-tender              Adnexa: no mass, fullness, tenderness               Rectal exam: Yes.  .  Confirms.              Anus:  normal sphincter tone, no lesions  Chaperone was present for exam:  Warren Lacy, CMA  ASSESSMENT:  Pelvic cramping.  Vaginal discharge.  Vaginal atrophy.  Post coital bleeding.  Cervical cancer screening.   PLAN:  Urinalysis:  sg 1.015, pH 5.5, 6 - 10 WBC, NS RBC, 6 - 10 epis, moderate bacteria.  UC sent.  No abx today.  Will wait for UC.  Nuswab vaginitis swab sent.  Discussed atrophy and possible tx options:  vaginal estrogen, Intrarosa.  Pap and HR HPV testing.  Return for pelvic US.   30 min  total time was spent for this patient encounter, including preparation, face-to-face counseling with the patient, coordination of care, and documentation of the encounter.  An After Visit Summary was provided to the patient.

## 2023-05-06 ENCOUNTER — Encounter: Payer: Self-pay | Admitting: Internal Medicine

## 2023-05-08 ENCOUNTER — Encounter (HOSPITAL_COMMUNITY): Payer: Self-pay

## 2023-05-11 ENCOUNTER — Telehealth (HOSPITAL_COMMUNITY): Payer: Self-pay | Admitting: *Deleted

## 2023-05-11 NOTE — Telephone Encounter (Signed)
Attempted to call patient regarding upcoming cardiac CT appointment. Unable to leave VM. Johney Frame RN Navigator Cardiac Imaging [facility] [phone] Office

## 2023-05-12 ENCOUNTER — Ambulatory Visit (HOSPITAL_COMMUNITY)
Admission: RE | Admit: 2023-05-12 | Discharge: 2023-05-12 | Disposition: A | Discharge status: Home / Self Care | Payer: Medicare Other | Source: Ambulatory Visit | Attending: Cardiology | Admitting: Cardiology

## 2023-05-12 DIAGNOSIS — E782 Mixed hyperlipidemia: Secondary | ICD-10-CM | POA: Diagnosis not present

## 2023-05-12 DIAGNOSIS — R072 Precordial pain: Secondary | ICD-10-CM | POA: Diagnosis present

## 2023-05-12 MED ORDER — NITROGLYCERIN 0.4 MG SL SUBL
SUBLINGUAL_TABLET | SUBLINGUAL | Status: AC
  Filled 2023-05-12: qty 2

## 2023-05-12 MED ORDER — IOHEXOL 350 MG/ML SOLN
95.0000 mL | Freq: Once | INTRAVENOUS | Status: AC | PRN
  Administered 2023-05-12: 95 mL via INTRAVENOUS

## 2023-05-12 MED ORDER — NITROGLYCERIN 0.4 MG SL SUBL
0.8000 mg | SUBLINGUAL_TABLET | Freq: Once | SUBLINGUAL | Status: AC
  Administered 2023-05-12: 0.8 mg via SUBLINGUAL

## 2023-05-13 ENCOUNTER — Ambulatory Visit: Payer: Medicare Other | Admitting: Obstetrics and Gynecology

## 2023-05-13 ENCOUNTER — Other Ambulatory Visit (HOSPITAL_COMMUNITY)
Admission: RE | Admit: 2023-05-13 | Discharge: 2023-05-13 | Disposition: A | Discharge status: Home / Self Care | Payer: Medicare Other | Source: Ambulatory Visit | Attending: Obstetrics and Gynecology | Admitting: Obstetrics and Gynecology

## 2023-05-13 ENCOUNTER — Encounter: Payer: Self-pay | Admitting: Obstetrics and Gynecology

## 2023-05-13 VITALS — BP 114/68 | HR 77 | Ht 65.75 in | Wt 140.0 lb

## 2023-05-13 DIAGNOSIS — B9689 Other specified bacterial agents as the cause of diseases classified elsewhere: Secondary | ICD-10-CM | POA: Diagnosis not present

## 2023-05-13 DIAGNOSIS — N898 Other specified noninflammatory disorders of vagina: Secondary | ICD-10-CM | POA: Diagnosis present

## 2023-05-13 DIAGNOSIS — N760 Acute vaginitis: Secondary | ICD-10-CM | POA: Diagnosis not present

## 2023-05-13 DIAGNOSIS — Z1151 Encounter for screening for human papillomavirus (HPV): Secondary | ICD-10-CM | POA: Insufficient documentation

## 2023-05-13 DIAGNOSIS — N930 Postcoital and contact bleeding: Secondary | ICD-10-CM | POA: Diagnosis present

## 2023-05-13 DIAGNOSIS — N952 Postmenopausal atrophic vaginitis: Secondary | ICD-10-CM | POA: Diagnosis not present

## 2023-05-13 DIAGNOSIS — Z01419 Encounter for gynecological examination (general) (routine) without abnormal findings: Secondary | ICD-10-CM | POA: Insufficient documentation

## 2023-05-13 DIAGNOSIS — R102 Pelvic and perineal pain: Secondary | ICD-10-CM | POA: Diagnosis not present

## 2023-05-13 DIAGNOSIS — Z124 Encounter for screening for malignant neoplasm of cervix: Secondary | ICD-10-CM | POA: Diagnosis not present

## 2023-05-13 NOTE — Patient Instructions (Signed)
Atrophic Vaginitis  Atrophic vaginitis is a condition in which the tissues that line the vagina become dry and thin. This condition is most common in women who have stopped having regular menstrual periods (are in menopause). This usually starts when a woman is 38 to 71 years old. That is the time when a woman's estrogen levels begin to decrease. Estrogen is a female hormone. It helps to keep the tissues of the vagina moist. It stimulates the vagina to produce a clear fluid that lubricates the vagina for sex. This fluid also protects the vagina from infection. Lack of estrogen can cause the lining of the vagina to get thinner and dryer. The vagina may also shrink in size. It may become less elastic. Atrophic vaginitis tends to get worse over time as a woman's estrogen level drops. What are the causes? This condition is caused by the normal drop in estrogen that happens around the time of menopause. What increases the risk? Certain conditions or situations may lower a woman's estrogen level, leading to a higher risk for atrophic vaginitis. You are more likely to develop this condition if: You are taking medicines that block estrogen. You have had your ovaries removed. You are being treated for cancer with radiation or medicines (chemotherapy). You have given birth or are breastfeeding. You are older than age 84. You smoke. What are the signs or symptoms? Symptoms of this condition include: Pain, soreness, a feeling of pressure, or bleeding during sex (dyspareunia). Vaginal burning, irritation, or itching. Pain or bleeding when a speculum is used in a vaginal exam. Having burning pain while urinating. Vaginal discharge. In some cases, there are no symptoms. How is this diagnosed? This condition is diagnosed based on your medical history and a physical exam. This will include a pelvic exam that checks the vaginal tissues. Though rare, you may also have other tests, including: A urine test. A  test that checks the acid balance in your vagina (acid balance test). How is this treated? Treatment for this condition depends on how severe your symptoms are. Treatment may include: Using an over-the-counter vaginal lubricant before sex. Using a long-acting vaginal moisturizer. Using low-dose estrogen for moderate to severe symptoms that do not respond to other treatments. Options include creams, tablets, and inserts (vaginal rings). Before you use a vaginal estrogen, tell your health care provider if you have a history of: Breast cancer. Endometrial cancer. Blood clots. If you are not sexually active and your symptoms are very mild, you may not need treatment. Follow these instructions at home: Medicines Take over-the-counter and prescription medicines only as told by your health care provider. Do not use herbal or alternative medicines unless your health care provider says that you can. Use over-the-counter creams, lubricants, or moisturizers for dryness only as told by your health care provider. General instructions If your atrophic vaginitis is caused by menopause, discuss all of your menopause symptoms and treatment options with your health care provider. Do not douche. Do not use products that can make your vagina dry. These include: Scented feminine sprays. Scented tampons. Scented soaps. Vaginal sex can help to improve blood flow and elasticity of vaginal tissue. If you choose to have sex and it hurts, try using a water-soluble lubricant or moisturizer right before having sex. Contact a health care provider if: Your discharge looks different than normal. Your vagina has an unusual smell. You have new symptoms. Your symptoms do not improve with treatment. Your symptoms get worse. Summary Atrophic vaginitis is a condition  in which the tissues that line the vagina become dry and thin. It is most common in women who have stopped having regular menstrual periods (are in  menopause). Treatment options include using vaginal lubricants and low-dose vaginal estrogen. Contact a health care provider if your vagina has an unusual smell, or if your symptoms get worse or do not improve after treatment. This information is not intended to replace advice given to you by your health care provider. Make sure you discuss any questions you have with your health care provider. Document Revised: 12/01/2019 Document Reviewed: 12/01/2019 Elsevier Patient Education  2024 [employer].

## 2023-05-14 LAB — URINALYSIS, COMPLETE W/RFL CULTURE
Bilirubin Urine: NEGATIVE
Glucose, UA: NEGATIVE
Hyaline Cast: NONE SEEN /[LPF]
Ketones, ur: NEGATIVE
Nitrites, Initial: NEGATIVE
Protein, ur: NEGATIVE
RBC / HPF: NONE SEEN /[HPF] (ref 0–2)
Specific Gravity, Urine: 1.015 (ref 1.001–1.035)
pH: 5.5 (ref 5.0–8.0)

## 2023-05-14 LAB — URINE CULTURE
MICRO NUMBER:: 15787537
Result:: NO GROWTH
SPECIMEN QUALITY:: ADEQUATE

## 2023-05-14 LAB — CULTURE INDICATED

## 2023-05-15 LAB — CERVICOVAGINAL ANCILLARY ONLY
Bacterial Vaginitis (gardnerella): POSITIVE — AB
Candida Glabrata: NEGATIVE
Candida Vaginitis: NEGATIVE
Comment: NEGATIVE
Comment: NEGATIVE
Comment: NEGATIVE
Comment: NEGATIVE
Trichomonas: NEGATIVE

## 2023-05-18 ENCOUNTER — Telehealth: Payer: Self-pay | Admitting: *Deleted

## 2023-05-18 LAB — CYTOLOGY - PAP
Adequacy: ABSENT
Comment: NEGATIVE
Diagnosis: NEGATIVE
High risk HPV: NEGATIVE

## 2023-05-18 NOTE — Telephone Encounter (Signed)
-----   Message from Melony Overly sent at 05/17/2023 12:49 PM EST ----- Please inform of result showing bacterial vaginosis. She may treat with Flagyl 500 mg po bid for 7 days or Metrogel pv at hs for 5 nights.   Hi Naiomy,   Your vaginitis testing shows bacterial vaginosis, a bacterial infection in the vagina that can cause discharge, burning, and odor.  This is treated with an antibiotic gel in the vagina or with a pill by mouth.  Both options work well.   I will have the office reach out to you this week to see which you prefer.   Your final urine culture is negative for bladder infection.   Your pap is not back yet.   Have a good week!  Conley Simmonds, MD

## 2023-05-18 NOTE — Telephone Encounter (Signed)
Message left to return call to [name] at [phone].   

## 2023-05-19 ENCOUNTER — Other Ambulatory Visit: Payer: Self-pay | Admitting: *Deleted

## 2023-05-19 MED ORDER — METRONIDAZOLE 0.75 % VA GEL: 1.0000 | Freq: Every day | VAGINAL | 0 refills | Status: AC

## 2023-05-26 ENCOUNTER — Telehealth: Payer: Self-pay | Admitting: Cardiology

## 2023-05-26 DIAGNOSIS — R911 Solitary pulmonary nodule: Secondary | ICD-10-CM

## 2023-05-26 NOTE — Telephone Encounter (Signed)
Received a call from Diane with [facility] calling to report a addendum to coronary ct done on 11/26.Addendum- 1.8 cm pleural based lingular lung nodules.Non contrast chest ct recommended.Report in chart.Message sent to [facility] for review.

## 2023-05-28 NOTE — Telephone Encounter (Signed)
pt aware of results  Order placed

## 2023-06-02 NOTE — Telephone Encounter (Signed)
Per review of Epic, Metrogel sent 05-19-23 by J. Hamm, RN. Patient also was notified of results.   Encounter closed.

## 2023-06-08 ENCOUNTER — Ambulatory Visit
Admission: RE | Admit: 2023-06-08 | Discharge: 2023-06-08 | Disposition: A | Discharge status: Home / Self Care | Payer: Medicare Other | Source: Ambulatory Visit | Attending: Cardiology | Admitting: Cardiology

## 2023-06-08 DIAGNOSIS — R911 Solitary pulmonary nodule: Secondary | ICD-10-CM

## 2023-06-11 ENCOUNTER — Other Ambulatory Visit: Payer: Medicare Other

## 2023-06-16 NOTE — Progress Notes (Deleted)
 GYNECOLOGY  VISIT   HPI: 72 y.o.   Married  African American female   G0P0000 with No LMP recorded. Patient is postmenopausal.   here for: U/S consult     GYNECOLOGIC HISTORY: No LMP recorded. Patient is postmenopausal. 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   Environmental allergies 04/24/2020   Irritable bowel syndrome with constipation 09/02/2019   Fear of side effects of medication 08/28/2019   Upper airway cough syndrome 05/04/2019   Pain in right knee 12/27/2018   Bigeminy 03/25/2018   Osteoporosis 08/11/2017   Abnormal glucose 06/25/2017   Medication management 12/12/2014   Labile hypertension 08/09/2014   GERD (gastroesophageal reflux disease)    Vitamin D  deficiency    Hyperlipidemia, mixed     Past Medical History:  Diagnosis Date   Arthritis    cervical and lumbar   Cataract    Mixed OU   Costochondritis 08/27/2020   Depression    GERD (gastroesophageal reflux disease)    Hyperlipidemia    IBS (irritable bowel syndrome)    Osteoporosis    Prediabetes    Vitamin D  deficiency     Past Surgical History:  Procedure Laterality Date   CARDIAC CATHETERIZATION  2011   normal (Dr. Levern)   FOOT SURGERY Right 02/2023   Removal of bunion and hammer toe   RADIOACTIVE SEED GUIDED EXCISIONAL BREAST BIOPSY Right 03/11/2016   Procedure: RIGHT RADIOACTIVE SEED GUIDED EXCISIONAL BREAST BIOPSY;  Surgeon: Donnice Bury, MD;  Location: [facility];  Service: General;  Laterality: Right;    Current Outpatient Medications  Medication Sig Dispense Refill   Acetaminophen  (TYLENOL  8 HOUR PO) Take by mouth.     alum & mag hydroxide-simeth (GERI-LANTA) 200-200-20 MG/5ML suspension Take by mouth.     aspirin  81 MG tablet Take 81 mg by mouth daily.     Bacillus Coagulans-Inulin (ALIGN PREBIOTIC-PROBIOTIC PO) Take 1 tablet by mouth daily.     Cholecalciferol  125 MCG (5000 UT) capsule Take 5,000 Units by mouth daily. Taking 4,000 units     cyclobenzaprine  (FLEXERIL ) 10 MG tablet Take 1/2 to 1 tablet 3 x /day as needed for Muscle Spasm                                /  TAKE                                         BY                                                 MOUTH 90 tablet 3   ezetimibe  (ZETIA ) 10 MG tablet Take 1 tablet Daily  for Cholesterol                              /                                                                   TAKE                                         BY                                                 MOUTH 90 tablet 3   Fluticasone  Propionate (FLONASE  ALLERGY  RELIEF NA) Place 2 sprays into the nose as needed.     gabapentin  (NEURONTIN ) 100 MG capsule Take 100 mg by mouth 3 (three) times daily. BID     Hyoscyamine Sulfate (HYOSCYAMINE PO) Take by mouth as needed.     ibuprofen (ADVIL) 400 MG tablet Take 400 mg by mouth every 6 (six) hours as needed.     loratadine  (CLARITIN ) 10 MG tablet Take 1 tablet (10 mg total) by mouth 2 (two) times daily as needed for allergies (Can take an extra dose during flare ups.). Take 1 tablet 1-2 times daily 60 tablet 11   Lutein 20 MG TABS Take by mouth.     Magnesium  250 MG TABS      Multiple Vitamin (MULTIVITAMIN) tablet Take 1 tablet by mouth daily.     nitroGLYCERIN  (NITROSTAT ) 0.4 MG SL tablet Place 0.4 mg under the tongue every 5 (five) minutes as needed for chest pain.     Polyethylene Glycol  3350 (MIRALAX PO) Take by mouth at bedtime.     RESTASIS  0.05 % ophthalmic emulsion instill 1 drop into both eyes twice a day  0   triamcinolone  (NASACORT ) 55 MCG/ACT AERO nasal inhaler Place into the nose.     vitamin B-12 (CYANOCOBALAMIN ) 1000 MCG tablet Take 1,000 mcg by mouth 3 (three) times a week.     No current facility-administered medications for this visit.     ALLERGIES: Bactrim [sulfamethoxazole-trimethoprim], Cephalexin , and Sulfa antibiotics  Family History  Problem Relation Age of Onset   Hypertension Mother    Other Mother        surgeries involving upper back   Diabetes Father    Heart failure Father    Cancer Maternal Grandmother  Lung   Leukemia Maternal Aunt    Allergic rhinitis Neg Hx    Angioedema Neg Hx    Asthma Neg Hx    Atopy Neg Hx    Eczema Neg Hx    Immunodeficiency Neg Hx    Urticaria Neg Hx    Migraines Neg Hx     Social History   Socioeconomic History   Marital status: Married    Spouse name: Not on file   Number of children: 0   Years of education: Not on file   Highest education level: Not on file  Occupational History   Occupation: retired mining engineer  Tobacco Use   Smoking status: Never    Passive exposure: Never   Smokeless tobacco: Never  Vaping Use   Vaping status: Never Used  Substance and Sexual Activity   Alcohol use: Yes    Comment: Rare   Drug use: No   Sexual activity: Yes    Birth control/protection: Post-menopausal    Comment: 1st intercourse 51 yo-5 partners  Other Topics Concern   Not on file  Social History Narrative   Lives at home with spouse   Right handed   Caffeine: not much, drinks decaf 2 cups/day   Social Drivers of Corporate Investment Banker Strain: Not on file  Food Insecurity: Low Risk  (04/30/2023)   Received from [facility]   Hunger Vital Sign    Worried About Running Out of Food in the Last Year: Never true    Ran Out of Food in the Last Year: Never true   Transportation Needs: No Transportation Needs (04/30/2023)   Received from [facility]    In the past 12 months, has lack of reliable transportation kept you from medical appointments, meetings, work or from getting things needed for daily living? : No  Physical Activity: Not on file  Stress: Not on file  Social Connections: Not on file  Intimate Partner Violence: Not on file    Review of Systems  PHYSICAL EXAMINATION:   There were no vitals taken for this visit.    General appearance: alert, cooperative and appears stated age Head: Normocephalic, without obvious abnormality, atraumatic Neck: no adenopathy, supple, symmetrical, trachea midline and thyroid  normal to inspection and palpation Lungs: clear to auscultation bilaterally Breasts: normal appearance, no masses or tenderness, No nipple retraction or dimpling, No nipple discharge or bleeding, No axillary or supraclavicular adenopathy Heart: regular rate and rhythm Abdomen: soft, non-tender, no masses,  no organomegaly Extremities: extremities normal, atraumatic, no cyanosis or edema Skin: Skin color, texture, turgor normal. No rashes or lesions Lymph nodes: Cervical, supraclavicular, and axillary nodes normal. No abnormal inguinal nodes palpated Neurologic: Grossly normal  Pelvic: External genitalia:  no lesions              Urethra:  normal appearing urethra with no masses, tenderness or lesions              Bartholins and Skenes: normal                 Vagina: normal appearing vagina with normal color and discharge, no lesions              Cervix: no lesions                Bimanual Exam:  Uterus:  normal size, contour, position, consistency, mobility, non-tender              Adnexa: no mass,  fullness, tenderness              Rectal exam: {yes no:314532}.  Confirms.              Anus:  normal sphincter tone, no lesions  Chaperone was present for exam:  {BSCHAPERONE:31226::Matson Welch F,  CMA}  ASSESSMENT:    PLAN:    {LABS (Optional):23779}  ***  total time was spent for this patient encounter, including preparation, face-to-face counseling with the patient, coordination of care, and documentation of the encounter.

## 2023-06-22 ENCOUNTER — Telehealth: Payer: Self-pay

## 2023-06-22 NOTE — Telephone Encounter (Signed)
 Patient identification verified by 2 forms. Shirl Ellen, RN     Slater from [facility] called to provide results of  CT Chest Wo Contrast  completed on 06/08/2023. She would like the provider to be aware of this specific impression information from the scan as listed below and relay it to patient.    IMPRESSION: Persistent spiculated nodule in the lingula abutting the interlobar fissure. Although this has a differential, based on appearance and size, would recommend further workup with a PET-CT scan.   There are some other small noncalcified nodules including some areas of tree-in-bud like changes in the lingula and inferior right upper lobe which could be a sequela of atypical infection.   Right thyroid  nodule. Please correlate with prior workup and biopsy.   Encounter from 06/22/23 addresses this result.

## 2023-06-30 ENCOUNTER — Other Ambulatory Visit: Payer: Medicare Other | Admitting: Obstetrics and Gynecology

## 2023-06-30 ENCOUNTER — Other Ambulatory Visit: Payer: Self-pay | Admitting: *Deleted

## 2023-06-30 ENCOUNTER — Other Ambulatory Visit: Payer: Medicare Other

## 2023-06-30 DIAGNOSIS — R911 Solitary pulmonary nodule: Secondary | ICD-10-CM

## 2023-07-01 ENCOUNTER — Ambulatory Visit: Payer: Medicare Other | Admitting: Internal Medicine

## 2023-07-01 ENCOUNTER — Telehealth: Payer: Self-pay | Admitting: Cardiology

## 2023-07-01 ENCOUNTER — Encounter: Payer: Self-pay | Admitting: Internal Medicine

## 2023-07-01 VITALS — BP 108/60 | HR 66 | Temp 97.8°F | Ht 65.0 in | Wt 142.4 lb

## 2023-07-01 DIAGNOSIS — R300 Dysuria: Secondary | ICD-10-CM

## 2023-07-01 MED ORDER — NITROGLYCERIN 0.4 MG SL SUBL: 0.4000 mg | SUBLINGUAL_TABLET | SUBLINGUAL | 0 refills | Status: DC | PRN

## 2023-07-01 NOTE — Telephone Encounter (Signed)
Attempted to call patient, no answer, mailbox full.

## 2023-07-01 NOTE — Progress Notes (Unsigned)
***** [name]      ADULT   &   ADOLESCENT      INTERNAL MEDICINE  Lucky Cowboy, M.D.          Rance Muir, ANP        Adela Glimpse, FNP  [facility] [address]  [place], [place]. [address] Telephone [phone] Telefax [phone]  Future Appointments  Date Time Provider Department  07/01/2023 11:30 AM Lucky Cowboy, MD [facility]  07/07/2023 11:30 AM Patton Salles, MD [facility]  07/24/2023 10:00 AM Carlus Pavlov, MD [facility]  07/30/2023                       wellness 10:30 AM Adela Glimpse, NP [facility]  11/02/2023                       cpe 10:30 AM Lucky Cowboy, MD [facility]  02/04/2024                       3 mo ov 10:30 AM Adela Glimpse, NP [facility]  05/09/2024                      6 mo ov  10:00 AM Lucky Cowboy, MD [facility]    History of Present Illness:   Patient had U/S Thyroid Rt lobe Aspiration Bx's in March 2024 with benign cytologies .    Current Outpatient Medications on File Prior to Visit  Medication Sig   Acetaminophen ( 8 HR ) Take   GERI-LANTA 200-200-20 MG/5ML susp Take    aspirin 81 MG tablet Take  daily.   ALIGN PREBIOTIC-PROBIOTIC  Take 1 tablet daily.   Vitamin D  5000 u Taking 4,000 units   cyclobenzaprine  10 MG tablet Take 1/2 to 1 tablet 3 x /day as needed    ezetimibe  10 MG tablet Take 1 tablet Daily     FLONASE  Place 2 sprays into the nose as needed.   gabapentin (100 MG capsule Take 3  times daily. BID   Hyoscyamine  Take as needed.   ibuprofen  400 MG tablet Take400 mg by mouth every 6) hours as needed.   loratadine  10 MG tablet 1 tablet 1-2 times daily   Lutein 20 MG TABS Take    Magnesium 250 MG TABS    Multiple Vitamin  Take 1 tablet daily.   NITROSTAT 0.4 MG SL tablet as needed for chest pain.   Polyethylene Glycol / MIRALAX  Take at bedtime.   RESTASIS 0.05 % ophth e instill 1 drop into both eyes twice a day   NASACORT AERO nasal inhaler Place into the nose.    vitamin B-12 1000 mcg tablet Take 3 times a week.     Allergies  Allergen Reactions   Bactrim [Sulfamethoxazole-Trimethoprim] Other (See Comments)    Patient preference to take medication without sulfa   Cephalexin Nausea And Vomiting    Rash; chest pain Rash; chest pain   Sulfa Antibiotics      Problem list She has GERD (gastroesophageal reflux disease); Vitamin D deficiency; Hyperlipidemia, mixed; Labile hypertension; Medication management; Abnormal glucose; Osteoporosis; Bigeminy; Upper airway cough syndrome; Fear of side effects of medication; Environmental allergies; Cholesterolosis of gallbladder; Irritable bowel syndrome with constipation; Pain in right knee; Neutropenia (HCC); and B12 deficiency on their problem list.   Observations/Objective:  There were no vitals taken for this visit.  HEENT - WNL. Neck - supple.  Chest - Clear equal BS. Cor - Nl HS. RRR w/o sig MGR. PP 1(+). No edema. MS- FROM w/o deformities.  Gait Nl. Neuro -  Nl w/o focal abnormalities.   Assessment and Plan:      Follow Up Instructions:        I discussed the assessment and treatment plan with the patient. The patient was provided an opportunity to ask questions and all were answered. The patient agreed with the plan and demonstrated an understanding of the instructions.       The patient was advised to call back or seek an in-person evaluation if the symptoms worsen or if the condition fails to improve as anticipated.    Marinus Maw, MD

## 2023-07-01 NOTE — Telephone Encounter (Signed)
 Patient called stating she was returning RN Debra's call.  Patient noted she has an appointment at 10:00 am today.

## 2023-07-02 LAB — URINALYSIS, ROUTINE W REFLEX MICROSCOPIC
Bilirubin Urine: NEGATIVE
Glucose, UA: NEGATIVE
Hgb urine dipstick: NEGATIVE
Ketones, ur: NEGATIVE
Leukocytes,Ua: NEGATIVE
Nitrite: NEGATIVE
Protein, ur: NEGATIVE
Specific Gravity, Urine: 1.005 (ref 1.001–1.035)
pH: 7.5 (ref 5.0–8.0)

## 2023-07-02 LAB — URINE CULTURE
MICRO NUMBER:: 15959770
Result:: NO GROWTH
SPECIMEN QUALITY:: ADEQUATE

## 2023-07-02 NOTE — Telephone Encounter (Signed)
2nd attempt to call patient, no answer mailbox full unable to leave message.

## 2023-07-02 NOTE — Progress Notes (Signed)
-   Test results slightly outside the reference range are not unusual. If there is anything important, I will review this with you,  otherwise it is considered normal test values.  If you have further questions,  please do not hesitate to contact me at the office or via My Chart.   =========================================================================  -  Urine culture returned OK - No Infection - So No Antibiotics needed    =========================================================================

## 2023-07-03 NOTE — Telephone Encounter (Signed)
Patient identification verified by 2 forms. Marilynn Rail, RN    Called and spoke to patient  Relayed result message  Patient states:   -saw PCP yesterday   -PCP placed referral for pulmonary yesterday  Patient has no questions at this time

## 2023-07-06 NOTE — Progress Notes (Unsigned)
GYNECOLOGY  VISIT   HPI: 73 y.o.   Married  [place] female   [id number] with No LMP recorded. Patient is postmenopausal.   here for: U/S consult for pelvic cramping and postcoital bleeding.  Cramping is the same and occurs more with sitting. The post coital bleeding continues.   Not bleeding otherwise.   Did not treat for BV.   She has the medication but did not treat.  Her symptoms resolved. UC was negative.  Patient has known fibroids.   Had pelvic US 10/08/21 showing 4 small fibroids, EMS 1.27 mm, normal ovaries. Follow up US 03/12/22 showed similar findings and small fluid in endometrial canal. Her EMB 03/19/22 showed benign inactive endometrium.  Her dx was atrophy.   GYNECOLOGIC HISTORY: No LMP recorded. Patient is postmenopausal. 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   Environmental allergies 04/24/2020   Irritable bowel syndrome with constipation 09/02/2019   Fear of side effects of medication 08/28/2019   Upper airway cough syndrome 05/04/2019   Pain in right knee 12/27/2018   Bigeminy 03/25/2018   Osteoporosis 08/11/2017   Abnormal glucose 06/25/2017   Medication management 12/12/2014   Labile hypertension 08/09/2014   GERD (gastroesophageal reflux disease)    Vitamin D deficiency    Hyperlipidemia, mixed     Past Medical History:  Diagnosis Date   Arthritis    cervical and lumbar   Cataract    Mixed OU   Costochondritis 08/27/2020    Depression    GERD (gastroesophageal reflux disease)    Hyperlipidemia    IBS (irritable bowel syndrome)    Osteoporosis    Prediabetes    Vitamin D deficiency     Past Surgical History:  Procedure Laterality Date   CARDIAC CATHETERIZATION  2011   normal (Dr. Sharyn Lull)   FOOT SURGERY Right 02/2023   Removal of bunion and hammer toe   RADIOACTIVE SEED GUIDED EXCISIONAL BREAST BIOPSY Right 03/11/2016   Procedure: RIGHT RADIOACTIVE SEED GUIDED EXCISIONAL BREAST BIOPSY;  Surgeon: Emelia Loron, MD;  Location: [facility];  Service: General;  Laterality: Right;    Current Outpatient Medications  Medication Sig Dispense Refill   Acetaminophen (TYLENOL 8 HOUR PO) Take by mouth.     alum & mag hydroxide-simeth (GERI-LANTA) 200-200-20 MG/5ML suspension Take by mouth.     aspirin 81 MG tablet Take 81 mg by mouth daily.     Bacillus Coagulans-Inulin (ALIGN PREBIOTIC-PROBIOTIC PO) Take 1 tablet by mouth daily.     Cholecalciferol 125 MCG (5000 UT) capsule Take 5,000 Units by mouth daily. Taking 4,000 units     cyclobenzaprine (FLEXERIL) 10 MG tablet Take 1/2 to 1 tablet 3 x /  day as needed for Muscle Spasm                                /                                                                   TAKE                                         BY                                                 MOUTH 90 tablet 3   ezetimibe (ZETIA) 10 MG tablet Take 1 tablet Daily  for Cholesterol                              /                                                                   TAKE                                         BY                                                 MOUTH 90 tablet 3   Fluticasone Propionate (FLONASE ALLERGY RELIEF NA) Place 2 sprays into the nose as needed.     gabapentin (NEURONTIN) 100 MG capsule Take 100 mg by mouth 3 (three) times daily. BID     Hyoscyamine Sulfate (HYOSCYAMINE PO) Take by mouth as needed.     ibuprofen (ADVIL) 400 MG tablet Take 400 mg  by mouth every 6 (six) hours as needed.     loratadine (CLARITIN) 10 MG tablet Take 1 tablet (10 mg total) by mouth 2 (two) times daily as needed for allergies (Can take an extra dose during flare ups.). Take 1 tablet 1-2 times daily 60 tablet 11   Lutein 20 MG TABS Take by mouth.     Magnesium 250 MG TABS      Multiple Vitamin (MULTIVITAMIN) tablet Take 1 tablet by mouth daily.     Polyethylene Glycol 3350 (MIRALAX PO) Take by mouth at bedtime.     RESTASIS 0.05 % ophthalmic emulsion instill 1 drop into both eyes twice a day  0   triamcinolone (NASACORT) 55 MCG/ACT AERO nasal inhaler Place into the nose.     vitamin B-12 (CYANOCOBALAMIN) 1000  MCG tablet Take 1,000 mcg by mouth 3 (three) times a week.     No current facility-administered medications for this visit.     ALLERGIES: Bactrim [sulfamethoxazole-trimethoprim], Cephalexin, and Sulfa antibiotics  Family History  Problem Relation Age of Onset   Hypertension Mother    Other Mother        surgeries involving upper back   Diabetes Father    Heart failure Father    Cancer Maternal Grandmother        Lung   Leukemia Maternal Aunt    Allergic rhinitis Neg Hx    Angioedema Neg Hx    Asthma Neg Hx    Atopy Neg Hx    Eczema Neg Hx    Immunodeficiency Neg Hx    Urticaria Neg Hx    Migraines Neg Hx     Social History   Socioeconomic History   Marital status: Married    Spouse name: Not on file   Number of children: 0   Years of education: Not on file   Highest education level: Not on file  Occupational History   Occupation: retired Mining engineer  Tobacco Use   Smoking status: Never    Passive exposure: Never   Smokeless tobacco: Never  Vaping Use   Vaping status: Never Used  Substance and Sexual Activity   Alcohol use: Yes    Comment: Rare   Drug use: No   Sexual activity: Yes    Birth control/protection: Post-menopausal    Comment: 1st intercourse 32 yo-5 partners  Other Topics Concern   Not on file   Social History Narrative   Lives at home with spouse   Right handed   Caffeine: not much, drinks decaf 2 cups/day   Social Drivers of Corporate investment banker Strain: Not on file  Food Insecurity: Low Risk  (04/30/2023)   Received from [facility]   Hunger Vital Sign    Worried About Running Out of Food in the Last Year: Never true    Ran Out of Food in the Last Year: Never true  Transportation Needs: No Transportation Needs (04/30/2023)   Received from [facility]    In the past 12 months, has lack of reliable transportation kept you from medical appointments, meetings, work or from getting things needed for daily living? : No  Physical Activity: Not on file  Stress: Not on file  Social Connections: Not on file  Intimate Partner Violence: Not on file    Review of Systems  All other systems reviewed and are negative.   PHYSICAL EXAMINATION:   BP 122/72 (BP Location: Left Arm, Patient Position: Sitting, Cuff Size: Small)   Pulse (!) 57   Ht 5' 5.75" (1.67 m)   Wt 142 lb (64.4 kg)   SpO2 99%   BMI 23.09 kg/m     General appearance: alert, cooperative and appears stated age  Pelvic US  Uterus 5.32 x 3.55 x 2.51 cm.  Fibroids:  Multiple subserosal and intramural fibroids noted: 1.49 cm, 1.02 cm, 1.41 cm, 0.52 cm, 0.68 cm.  EMS 1.36 mm.  No masses or thickening.  Sliver of fluid in the canal.  Left ovary 1.23 x 0.74 x 0.60 cm.  Right ovary 1.17 x 0.84 x. 0.69 cm.  No adnexal masses.  No free fluid.   ASSESSMENT:    PLAN:  Patient will consider vaginal estrogen.  {LABS (Optional):23779}  ***  total time was spent for this patient encounter, including  preparation, face-to-face counseling with the patient, coordination of care, and documentation of the encounter.

## 2023-07-07 ENCOUNTER — Ambulatory Visit (INDEPENDENT_AMBULATORY_CARE_PROVIDER_SITE_OTHER): Payer: Medicare Other

## 2023-07-07 ENCOUNTER — Encounter: Payer: Self-pay | Admitting: Obstetrics and Gynecology

## 2023-07-07 ENCOUNTER — Other Ambulatory Visit (HOSPITAL_BASED_OUTPATIENT_CLINIC_OR_DEPARTMENT_OTHER): Payer: Self-pay

## 2023-07-07 ENCOUNTER — Ambulatory Visit: Payer: Medicare Other | Admitting: Obstetrics and Gynecology

## 2023-07-07 VITALS — BP 122/72 | HR 57 | Ht 65.75 in | Wt 142.0 lb

## 2023-07-07 DIAGNOSIS — D219 Benign neoplasm of connective and other soft tissue, unspecified: Secondary | ICD-10-CM

## 2023-07-07 DIAGNOSIS — N952 Postmenopausal atrophic vaginitis: Secondary | ICD-10-CM

## 2023-07-07 DIAGNOSIS — R102 Pelvic and perineal pain: Secondary | ICD-10-CM

## 2023-07-07 MED ORDER — COVID-19 MRNA VAC-TRIS(PFIZER) 30 MCG/0.3ML IM SUSY
0.3000 mL | PREFILLED_SYRINGE | Freq: Once | INTRAMUSCULAR | 0 refills | Status: AC
  Filled 2023-07-07: qty 0.3, 1d supply, fill #0

## 2023-07-07 NOTE — Patient Instructions (Addendum)
Estradiol Vaginal Cream What is this medication? ESTRADIOL (es tra DYE ole) reduces vaginal irritation, dryness, and pain during sex due to menopause. It is an estrogen hormone. This medicine may be used for other purposes; ask your health care provider or pharmacist if you have questions. COMMON BRAND NAME(S): Estrace What should I tell my care team before I take this medication? They need to know if you have any of these conditions: Abnormal vaginal bleeding Blood vessel disease or blood clots Breast, cervical, endometrial, ovarian, liver, or uterine cancer Dementia Diabetes Gallbladder disease Heart disease or recent heart attack High blood pressure High cholesterol High levels of calcium in the blood Hysterectomy Kidney disease Liver disease Migraine headaches Protein C/S deficiency Stroke Systemic lupus erythematosus (SLE) Tobacco use An unusual or allergic reaction to estrogens, soy, other medications, foods, dyes, or preservatives Pregnant or trying to get pregnant Breast-feeding How should I use this medication? This medication is for use in the vagina only. Do not take by mouth. Follow the directions on the prescription label. Read package directions carefully before using. Use the special applicator supplied with the cream. Wash hands before and after use. Fill the applicator with the prescribed amount of cream. Lie on your back, part and bend your knees. Insert the applicator into the vagina and push the plunger to expel the cream into the vagina. Wash the applicator with warm soapy water and rinse well. Use exactly as directed for the complete length of time prescribed. Do not stop using except on the advice of your care team. A patient package insert for the product will be given with each prescription and refill. Read this sheet carefully each time. The sheet may change frequently. Talk to your care team about the use of this medication in children. This medication is not  approved for use in children. Overdosage: If you think you have taken too much of this medicine contact a poison control center or emergency room at once. NOTE: This medicine is only for you. Do not share this medicine with others. What if I miss a dose? If you miss a dose, use it as soon as you can. If it is almost time for your next dose, use only that dose. Do not use double or extra doses. What may interact with this medication? Do not take this medication with any of the following: Aromatase inhibitors like aminoglutethimide, anastrozole, exemestane, letrozole, testolactone This medication may also interact with the following: Barbiturates used for inducing sleep or treating seizures Carbamazepine Grapefruit juice Medications for fungal infections like ketoconazole and itraconazole Raloxifene Rifabutin Rifampin Rifapentine Ritonavir Some antibiotics used to treat infections St. John's Wort Tamoxifen Warfarin This list may not describe all possible interactions. Give your health care provider a list of all the medicines, herbs, non-prescription drugs, or dietary supplements you use. Also tell them if you smoke, drink alcohol, or use illegal drugs. Some items may interact with your medicine. What should I watch for while using this medication? Visit your care team for regular checks on your progress. You will need a regular breast and pelvic exam. You should also discuss the need for regular mammograms with your care team, and follow their guidelines. This medication can make your body retain fluid, making your fingers, hands, or ankles swell. Your blood pressure can go up. Contact your care team if you feel you are retaining fluid. If you have any reason to think you are pregnant, stop taking this medication at once and contact your care team.  Smoking tobacco increases the risk of getting a blood clot or having a stroke while you are taking this medication, especially if you are older  than 35 years. If you wear contact lenses and notice visual changes, or if the lenses begin to feel uncomfortable, consult your eye care specialist. If you are going to have elective surgery, you may need to stop taking this medication beforehand. Consult your care team for advice prior to scheduling the surgery. What side effects may I notice from receiving this medication? Side effects that you should report to your care team as soon as possible: Allergic reactions--skin rash, itching, hives, swelling of the face, lips, tongue, or throat Blood clot--pain, swelling, or warmth in the leg, shortness of breath, chest pain Breast tissue changes, new lumps, redness, pain, or discharge from the nipple Gallbladder problems--severe stomach pain, nausea, vomiting, fever Increase in blood pressure Liver injury--right upper belly pain, loss of appetite, nausea, light-colored stool, dark yellow or brown urine, yellowing skin or eyes, unusual weakness or fatigue Stroke--sudden numbness or weakness of the face, arm, or leg, trouble speaking, confusion, trouble walking, loss of balance or coordination, dizziness, severe headache, change in vision Unusual vaginal discharge, itching, or odor Vaginal bleeding after menopause, pelvic pain Side effects that usually do not require medical attention (report to your care team if they continue or are bothersome): Bloating Breast pain or tenderness Nausea Vaginal irritation at application site Vomiting This list may not describe all possible side effects. Call your doctor for medical advice about side effects. You may report side effects to FDA at 1-800-FDA-1088. Where should I keep my medication? Keep out of the reach of children and pets. Store at room temperature between 15 and 30 degrees C (59 and 86 degrees F). Protect from temperatures above 40 degrees C (104 degrees C). Do not freeze. Throw away any unused medication after the expiration date. NOTE: This  sheet is a summary. It may not cover all possible information. If you have questions about this medicine, talk to your doctor, pharmacist, or health care provider.  2024 Elsevier/Gold Standard (2021-02-08 00:00:00)  Vaginal Dryness and Thinning (Atrophic Vaginitis): What to Know  Atrophic vaginitis is a condition where the lining of the vagina becomes thin, dry, and inflamed. This condition can make you more likely to: Get an infection. Have pain during sex. Have other uncomfortable symptoms. Tell your health care provider if you notice any changes in your vagina. They can help you find ways to feel better. They can also treat infections and suggest healthy habits for a healthy vagina. What are the causes? Atrophic vaginitis is caused by a drop in estrogen. It's more common when menstrual periods stop during menopause. This often starts between ages 5-55 and may get worse over time as estrogen levels get lower. Estrogen helps to: Keep the vagina moist. Lubricate the vagina during sex. Protect against infection. A drop in estrogen can lead to: Thinner and drier vaginal lining. A smaller, less stretchy vagina. What increases the risk? Many factors make you more likely to have vaginal dryness and thinning. These include: Taking medicines that block estrogen. Having your ovaries taken out. Cancer treatment, such as: Radiation. Chemotherapy. Having recently delivered a baby, especially if not a vaginal delivery. Breastfeeding. Being over age 70. Having an eating disorder. Smoking. What are the signs or symptoms? Pain, soreness, or bleeding during sex. Burning, irritation, or itching around your vagina. Loss of interest in sex. Burning pain while peeing or peeing often. Vaginal  discharge. It may be: White. Wallace Cullens. Yellow. Blood-tinged. Thick. Watery. Sometimes, there are no symptoms. How is this diagnosed? This condition is diagnosed based on: Your medical history. A pelvic  exam to check the tissue in the vagina. Rarely, you may have more tests. These include: A pee test to check for a urinary tract infection. An acid balance check in the vagina. How is this treated? Treatment depends on your symptoms. Treatment may include: Lubricants for the vagina. Long-acting moisturizers.  Try Replens Low-dose estrogen. These come in: Creams. Tablets. Vaginal rings.  -  Consider vaginal vitamin also.  Treatment may not be needed if your symptoms are mild and you're not having sex. Talk to your provider about any history of breast cancer, endometrial cancer, or blood clots. Follow these instructions at home: Medicines Take your medicines only as told. Do not use herbal or other medicines unless your provider says it's safe. Use creams, lubricants, or moisturizers only as told. General instructions If your dryness and thinning is related to menopause, talk about your symptoms and treatment options with your provider. Do not douche. Do not use products that make your vagina dry. These include: Scented sprays. Tampons. Soaps. Use water-soluble lubricant or moisturizer if sex is painful. Having sex more often can improve blood flow and make the vaginal tissue more stretchy. Contact a health care provider if: Your discharge from your vagina changes in color and has a smell. You have new symptoms. Your symptoms don't get better with treatment. Your symptoms get worse. This information is not intended to replace advice given to you by your health care provider. Make sure you discuss any questions you have with your health care provider. Document Revised: 01/12/2023 Document Reviewed: 01/12/2023 Elsevier Patient Education  2024 [employer].

## 2023-07-24 ENCOUNTER — Encounter: Payer: Self-pay | Admitting: Internal Medicine

## 2023-07-24 ENCOUNTER — Ambulatory Visit: Payer: Medicare Other | Admitting: Internal Medicine

## 2023-07-24 VITALS — BP 110/60 | HR 80 | Ht 65.75 in | Wt 142.6 lb

## 2023-07-24 DIAGNOSIS — M810 Age-related osteoporosis without current pathological fracture: Secondary | ICD-10-CM

## 2023-07-24 DIAGNOSIS — E041 Nontoxic single thyroid nodule: Secondary | ICD-10-CM

## 2023-07-24 NOTE — Patient Instructions (Addendum)
 Please send me a message in 02/2024 to order the bone density scan.  You should have an endocrinology follow-up appointment in 1 year.

## 2023-07-24 NOTE — Progress Notes (Signed)
 Patient ID: [name], female   DOB: Dec 28, 1950, 73 y.o.   MRN: [id number]   HPI  [name] is a 73 y.o.-year-old pleasant  female, initially referred by her PCP, Dr. Tonita, returning for follow-up for osteoporosis (OP).  She previously saw Dr. Sam in our practice in 04/2020 but wanted a second opinion.  Last visit with me 73 years ago.  Interim history: No fractures or falls since last visit. No dizziness/orthostasis/vertigo/poor vision. She continues to have hip pain when sleeping if she does not use a pillow between her legs. She has R>L sciatica (imaging >> slipped disk) -she was in physical therapy. Now only intermittent pain. Since last visit, she was in the emergency room with chest pain x 2 in 02/2023 -likely costochondritis. She had hammer toe surgery 01/2023.  She still has some stiffness in the toe.  She was exercising stopped around the time of the surgery. She is planning to restart chair yoga and Silver sneakers.  Reviewed and addended history: Pt was dx with OP in 2019.  I reviewed pt's DXA scan reports: Date L1-L4 T score FN T score 33% distal Radius  03/05/2022 ([facility])  -3.1 RFN: -2.0 LFN: -2.1 N/a  02/14/2020 ([facility])  -3.3 RFN: -2.1 LFN: -2.1 n/a  08/05/2017 ([facility])  -3.6 RFN: -2.1 LFN: -2.2 n/a   Previous OP treatments:  - she was recommended Fosamax  70 mg weekly by Dr. Sam, and she started this but had a rash on her knee, some low leg pains and also joint pain in TMJ so she stopped after 1 dose  + h/o vitamin D  deficiency. Reviewed available vit D levels: Lab Results  Component Value Date   VD25OH 70 04/17/2023   VD25OH 66 01/08/2023   VD25OH 74 10/13/2022   VD25OH 65 04/03/2022   VD25OH 93.60 07/17/2021   VD25OH 58 04/03/2021   VD25OH 83.8 07/11/2020   VD25OH 83 03/20/2020   VD25OH 71 08/29/2019   VD25OH 59 02/17/2019   Pt is on: - vitamin D  4000 units daily at last visit, + also +/- multivitamin The dose was decreased in  04/2020.  She is not taking calcium  due to constipation.  She is not drinking milk due to hyperlipidemia. Drinks almond milk.  No weight bearing exercises.  However, her sciatica has improved and she is planning to restart (see above).  She does not take high vitamin A doses.  No recent steroid use.  She had prednisone  tapers in the past.  No smoking.  Alcohol: Beer or wine occasionally.  Menopause was at 73 y/o.  No history of HRT.  No family history of osteoporosis.  No h/o hyper/hypocalcemia or hyperparathyroidism. No h/o kidney stones. Lab Results  Component Value Date   CALCIUM  9.6 04/17/2023   CALCIUM  9.8 03/16/2023   CALCIUM  9.5 02/18/2023   CALCIUM  9.5 01/08/2023   CALCIUM  9.6 10/13/2022   CALCIUM  9.5 04/17/2022   CALCIUM  9.4 04/03/2022   CALCIUM  9.8 02/25/2022   CALCIUM  9.6 01/22/2022   CALCIUM  9.7 10/02/2021   No h/o thyrotoxicosis. Reviewed TSH recent levels:  Lab Results  Component Value Date   TSH 1.17 10/13/2022   TSH 0.82 04/03/2022   TSH 1.30 10/02/2021   TSH 1.21 04/03/2021   TSH 0.97 01/25/2021   No h/o CKD. Last BUN/Cr: Lab Results  Component Value Date   BUN 9 04/17/2023   CREATININE 0.71 04/17/2023   She has a history of GERD >> was on Omeprazole  and Mylanta >> now off .  She also has a history of small hiatal hernia.  She was found to have an incidental right thyroid  nodule on head and neck CT.  Thyroid  ultrasound (05/23/2021): Parenchymal Echotexture: Mildly heterogeneous Isthmus: 0.2 cm  Right lobe: 4.6 x 2.0 x 1.8 cm  Left lobe: 4.4 x 1.5 x 1.4 cm  ___________________________________________________________________________   Nodule # 2:  Location: Right; inferior  Maximum size: 2.1 cm; Other 2 dimensions: 1.6 x 1.4 cm  Composition: solid/almost completely solid (2)  Echogenicity: isoechoic (1)  *Given size (>/= 1.5 - 2.4 cm) and appearance, a follow-up ultrasound in 1 year should be considered based on TI-RADS criteria.   _________________________________________________________   Remaining subcentimeter thyroid  nodules do not meet criteria for FNA or imaging surveillance.   IMPRESSION: Nodule 2 (TI-RADS 3) located in the inferior right thyroid  lobe meets criteria for imaging follow-up.   Annual ultrasound surveillance is recommended until 5 years of stability is documented.  Thyroid  U/S (07/30/2022): Parenchymal Echotexture: Mildly heterogenous  Isthmus: 0.3 cm  Right lobe: 4.8 x 1.9 x 2.4 cm  Left lobe: 3.9 x 1.4 x 1.5 cm  _________________________________________________________   Estimated total number of nodules >/= 1 cm: 1 _________________________________________________________   Nodule # 1:  Prior biopsy: No  Location: Right; inferior  Maximum size: 2.2 cm; Other 2 dimensions: 1.7 x 1.5 cm, previously, 2.1 x 1.6 x 1.4 cm  Composition: solid/almost completely solid (2)  Echogenicity: isoechoic (1) Margins: extra-thyroidal extension (3) Change in features: Yes Change in ACR TI-RADS risk category: Yes **Given size (>/= 1.5 cm) and appearance, fine needle aspiration of this moderately suspicious nodule should be considered based on TI-RADS criteria.  _________________________________________________________   Predominantly cystic 6 mm left inferior thyroid  nodule does not meet criteria for imaging surveillance or FNA.   IMPRESSION: Nodule 1 (TI-RADS 4), measuring 2.2 cm, located in the inferior right thyroid  lobe appears to extend outside of the posterior thyroid  capsule and therefore now meets criteria for FNA.    FNA (08/21/2022): benign: Clinical History: Nodule #1: Right; Inferior, Maximum size: 2.2 cm;  Other 2 dimensions: 1.7 x 1.5 cm, previously, 2.1 x 1.6 x 1.4 cm,  solid/almost completely solid, isoechoic, margins: extra-thyroidal  extension, TI-RADS total points: 6.  Specimen Submitted:  A. THYROID , RT INFERIOR, FINE NEEDLE ASPIRATION:    FINAL MICROSCOPIC  DIAGNOSIS:  - Consistent with benign follicular nodule (Bethesda category II)   SPECIMEN ADEQUACY:  Satisfactory for evaluation   She also has IBS with constipation, hyperlipidemia, uterine leiomyoma.  She is retired comptroller.  ROS: + See HPI  I reviewed pt's medications, allergies, PMH, social hx, family hx, and changes were documented in the history of present illness. Otherwise, unchanged from my initial visit note.  Past Medical History:  Diagnosis Date   Arthritis    cervical and lumbar   Cataract    Mixed OU   Costochondritis 08/27/2020   Depression    GERD (gastroesophageal reflux disease)    Hyperlipidemia    IBS (irritable bowel syndrome)    Osteoporosis    Prediabetes    Vitamin D  deficiency    Past Surgical History:  Procedure Laterality Date   CARDIAC CATHETERIZATION  2011   normal (Dr. Levern)   FOOT SURGERY Right 02/2023   Removal of bunion and hammer toe   RADIOACTIVE SEED GUIDED EXCISIONAL BREAST BIOPSY Right 03/11/2016   Procedure: RIGHT RADIOACTIVE SEED GUIDED EXCISIONAL BREAST BIOPSY;  Surgeon: Donnice Bury, MD;  Location: [facility];  Service: General;  Laterality: Right;   Social History   Socioeconomic History   Marital status: Married    Spouse name: Not on file   Number of children: 0   Years of education: Not on file   Highest education level: Not on file  Occupational History   Occupation: retired mining engineer  Tobacco Use   Smoking status: Never    Passive exposure: Never   Smokeless tobacco: Never  Vaping Use   Vaping status: Never Used  Substance and Sexual Activity   Alcohol use: Yes    Comment: Rare   Drug use: No   Sexual activity: Yes    Birth control/protection: Post-menopausal    Comment: 1st intercourse 73 yo-5 partners  Other Topics Concern   Not on file  Social History Narrative   Lives at home with spouse   Right handed   Caffeine: not much, drinks decaf 2 cups/day   Social  Drivers of Corporate Investment Banker Strain: Not on file  Food Insecurity: Low Risk  (04/30/2023)   Received from [facility]   Hunger Vital Sign    Worried About Running Out of Food in the Last Year: Never true    Ran Out of Food in the Last Year: Never true  Transportation Needs: No Transportation Needs (04/30/2023)   Received from [facility]    In the past 12 months, has lack of reliable transportation kept you from medical appointments, meetings, work or from getting things needed for daily living? : No  Physical Activity: Not on file  Stress: Not on file  Social Connections: Not on file  Intimate Partner Violence: Not on file   Current Outpatient Medications on File Prior to Visit  Medication Sig Dispense Refill   Acetaminophen  (TYLENOL  8 HOUR PO) Take by mouth.     alum & mag hydroxide-simeth (GERI-LANTA) 200-200-20 MG/5ML suspension Take by mouth.     aspirin  81 MG tablet Take 81 mg by mouth daily.     Bacillus Coagulans-Inulin (ALIGN PREBIOTIC-PROBIOTIC PO) Take 1 tablet by mouth daily.     Cholecalciferol  125 MCG (5000 UT) capsule Take 5,000 Units by mouth daily. Taking 4,000 units     cyclobenzaprine  (FLEXERIL ) 10 MG tablet Take 1/2 to 1 tablet 3 x /day as needed for Muscle Spasm                                /                                                                   TAKE                                         BY                                                 MOUTH 90 tablet 3   ezetimibe  (ZETIA ) 10 MG tablet Take 1 tablet Daily  for Cholesterol                              /                                                                   TAKE                                         BY                                                 MOUTH 90 tablet 3   Fluticasone  Propionate (FLONASE  ALLERGY  RELIEF NA) Place 2 sprays into the nose as needed.     gabapentin  (NEURONTIN ) 100 MG capsule Take 100 mg by mouth 3 (three) times daily. BID      Hyoscyamine Sulfate (HYOSCYAMINE PO) Take by mouth as needed.     ibuprofen (ADVIL) 400 MG tablet Take 400 mg by mouth every 6 (six) hours as needed.     loratadine  (CLARITIN ) 10 MG tablet Take 1 tablet (10 mg total) by mouth 2 (two) times daily as needed for allergies (Can take an extra dose during flare ups.). Take 1 tablet 1-2 times daily 60 tablet 11   Lutein 20 MG TABS Take by mouth.     Magnesium  250 MG TABS      Multiple Vitamin (MULTIVITAMIN) tablet Take 1 tablet by mouth daily.     Polyethylene Glycol 3350 (MIRALAX PO) Take by mouth at bedtime.     RESTASIS  0.05 % ophthalmic emulsion instill 1 drop into both eyes twice a day  0   triamcinolone  (NASACORT ) 55 MCG/ACT AERO nasal inhaler Place into the nose.     vitamin B-12 (CYANOCOBALAMIN ) 1000 MCG tablet Take 1,000 mcg by mouth 3 (three) times a week.     No current facility-administered medications on file prior to visit.   Allergies  Allergen Reactions   Bactrim [Sulfamethoxazole-Trimethoprim] Other (See Comments)    Patient preference to take medication without sulfa   Cephalexin  Nausea And Vomiting    Rash; chest pain Rash; chest pain   Sulfa Antibiotics    Family History  Problem Relation Age of Onset   Hypertension Mother    Other Mother        surgeries involving upper back   Diabetes Father    Heart failure Father    Cancer Maternal Grandmother        Lung   Leukemia Maternal Aunt    Allergic rhinitis Neg Hx    Angioedema Neg Hx    Asthma Neg Hx    Atopy Neg Hx    Eczema Neg Hx    Immunodeficiency Neg Hx    Urticaria Neg Hx    Migraines Neg Hx    PE: BP 110/60   Pulse 80   Ht 5' 5.75 (1.67 m)   Wt 142 lb 9.6 oz (64.7 kg)   SpO2 99%   BMI 23.19  kg/m  Wt Readings from Last 3 Encounters:  07/24/23 142 lb 9.6 oz (64.7 kg)  07/07/23 142 lb (64.4 kg)  07/01/23 142 lb 6.4 oz (64.6 kg)   Constitutional: normal weight, in NAD Eyes:  EOMI, no exophthalmos ENT: no neck masses, no cervical  lymphadenopathy Cardiovascular: RRR, No MRG Respiratory: CTA B Musculoskeletal: no deformities Skin:no rashes Neurological: no tremor with outstretched hands  Assessment: 1. Osteoporosis  2.  Right thyroid  nodule  Plan: 1. Osteoporosis -Likely postmenopausal/age-related; no family history of osteoporosis. -We previously discussed about maintaining a good amount of protein in her diet (50 g a day approximately for her weight), and also a low acid diet and drinking less than 2 alcoholic drinks a day.  She is not smoking. -She is walking but I also recommended weightbearing exercises 5 out of 7 days at least.  She did not exercise recently after foot surgery and also due to sciatica but plans to start going to Silver sneakers and also doing chair yoga.  I advised her to also do more weightbearing exercises and given examples of exercises she can even do at home. -We reviewed the latest bone density scan reports.  Based on the T-scores she is at an increased risk for fractures.  The risk is higher at the spine level.  However, the T-scores have improved at the level of the spine between 2019 and 2021 and they were stable at the hips.  The latest bone density report is from 02/2022 and it showed that, at the level of the spine, T-scores improved slightly from -3.3 to -3.1.  However, the absolute value is still quite low. -At last visit and again today we reviewed different treatment options.  As she does have acid reflux, we should avoid oral medications.  My suggestion would be for Prolia or Reclast.  I previously gave her reading information about the 2 medications and we discussed about possible side effects including ONJ and atypical fractures.  However, she prefers to manage her osteoporosis without medication.  She did previously have an episode of joint pain/TMJ and I explained that this was unlikely to be related to 1 dose of oral bisphosphonates that she took in 04/2020.  She was open to try  Reclast at last visit but she had sciatica and I recommended to try to start this only after the sciatica pain improved.  I advised her to contact me within a month after the pain was resolved.  However, she continues to have sciatica pain intermittently. -At today's visit we discussed about starting Reclast but she tells me that she would really not want to start it now.  She wants to wait until the next bone density.  This will be due in 02/2024 and I advised her to get in touch with me at that time so I can order it at [facility] mammography. -We previously discussed about [facility] for skeletal loading but she did not pursue this -She continues on vitamin D  4000 units daily + the amount of multivitamin -Most recent vitamin D  level was normal on 04/17/2023: 70.  Will not repeat this today.  Latest calcium  and kidney function were normal at that time. -I will see her back in 1 year.  2.  Right thyroid  nodule -No neck compression symptoms -Previously investigated by PCP -She had a thyroid  ultrasound in 05/2021 showing an inferior thyroid  nodule which was isoechoic, measuring 2.1 x 1.6 x 1.4 cm -At last visit, we repeated the ultrasound (07/30/2022): Nodule measured  2.2 cm and FNA was recommended.  She had this on 08/21/2022 and it was benign. -Plan to follow her clinically for now and repeat a thyroid  ultrasound in 3 to 4 years.  Lela Fendt, MD PhD [facility]

## 2023-07-30 ENCOUNTER — Ambulatory Visit: Payer: Medicare Other | Admitting: Nurse Practitioner

## 2023-08-03 ENCOUNTER — Ambulatory Visit: Payer: Medicare Other | Admitting: Acute Care

## 2023-08-03 ENCOUNTER — Encounter: Payer: Self-pay | Admitting: Acute Care

## 2023-08-03 VITALS — BP 110/69 | HR 65 | Temp 96.8°F | Ht 65.0 in | Wt 143.2 lb

## 2023-08-03 DIAGNOSIS — R059 Cough, unspecified: Secondary | ICD-10-CM | POA: Diagnosis not present

## 2023-08-03 DIAGNOSIS — R911 Solitary pulmonary nodule: Secondary | ICD-10-CM

## 2023-08-03 NOTE — Progress Notes (Signed)
History of Present Illness [name] is a 73 y.o. female never smoker referred 07/2023 for lung nodule consult. She will be followed by [name].  PMH Includes Allergies, prediabetes and IBS.  Breast biopsy 2017  Family Hx includes Cancer in her maternal grandmother, Leukemia in her maternal aunt   08/03/2023 Pt. Presents for consult for finding of a persistent spiculated ill-defined nodule in the lingula 18 x 10 mm with retraction of the adjacent posterior interlobar fissure.It has shrunk slightly on her most recent CT Chest done 06/08/2023.  In December 2024, patient  experienced chest pain and went to the emergency department, where a CT scan revealed a pulmonary nodule. She has no history of smoking, which decreases her risk for malignancy. She has not had any prior CT scan of the chest before this incident. The CT scan showed 'tree and bud' patterns in the right upper lobe.   She has a nonproductive cough, particularly at night, and occasional sinus drainage. She also experiences a productive cough with clear mucus. No weight loss or hemoptysis. She notes occasional breathing difficulties, describing it as 'not as smooth' as usual, but she does not experience significant breathing issues during physical activities like walking.  She has a history of reflux and was given medication for muscle spasms in the chest area, which was considered a possible cause of her chest pain.  She is a retired Comptroller, lives with her husband, and has no significant occupational exposures to smoke, chemicals, or asbestos.  Test Results: CT Chest 06/08/2023 Mediastinum/Nodes: Normal caliber thoracic esophagus. Low-attenuation right-sided thyroid lesion measuring 2.2 cm. Please correlate with prior thyroid ultrasound of 07/30/2022 and subsequent biopsy. On this non IV contrast exam there is no specific abnormal lymph node enlargement identified in the axillary regions, hilum or mediastinum.    Lungs/Pleura: No consolidation, pneumothorax or effusion. There is some linear opacity seen at the bases likely scar or atelectasis. As seen previously there is spiculated ill-defined nodule in the lingula with retraction of the adjacent posterior interlobar fissure. Lesion today on series 2, image 83 measures 18 by 10 mm. Previously when measured in the same fashion is today's study this has would have measured 19 by 11 mm, similar. There is also smaller 5 mm nodule in the middle lobe on series 2, image 94. This also was present previously. There is a area of tree-in-bud reticulonodular changes in the posteroinferior right upper lobe as seen on image 72 of series 2. Few other scattered areas as well in the lingula. These could relate to atypical infection granulomatous infection. There is also a small nodule in the right upper lobe measuring 3 mm on series 2, image 34 not included in the imaging field of the prior examination.  IMPRESSION: Persistent spiculated nodule in the lingula abutting the interlobar fissure. Although this has a differential, based on appearance and size, would recommend further workup with a PET-CT scan.   There are some other small noncalcified nodules including some areas of tree-in-bud like changes in the lingula and inferior right upper lobe which could be a sequela of atypical infection.   Right thyroid nodule. Please correlate with prior workup and biopsy.       Latest Ref Rng & Units 04/17/2023   11:33 AM 03/16/2023    4:38 PM 02/18/2023    6:59 PM  CBC  WBC 3.8 - 10.8 Thousand/uL 2.7  5.9  3.6   Hemoglobin 11.7 - 15.5 g/dL 91.4  78.2  95.6  Hematocrit 35.0 - 45.0 % 34.8  38.4  34.9   Platelets 140 - 400 Thousand/uL 173  149  155        Latest Ref Rng & Units 04/17/2023   11:33 AM 03/16/2023    4:38 PM 02/18/2023    6:59 PM  BMP  Glucose 65 - 99 mg/dL 84  98  97   BUN 7 - 25 mg/dL 9  15  18    Creatinine 0.60 - 1.00 mg/dL 6.96  2.95  2.84    BUN/Creat Ratio 6 - 22 (calc) SEE NOTE:     Sodium 135 - 146 mmol/L 139  139  137   Potassium 3.5 - 5.3 mmol/L 4.6  3.8  3.8   Chloride 98 - 110 mmol/L 105  103  103   CO2 20 - 32 mmol/L 28  27  23    Calcium 8.6 - 10.4 mg/dL 9.6  9.8  9.5     BNP No results found for: "BNP"  ProBNP    Component Value Date/Time   PROBNP 49.0 05/03/2019 1117    PFT No results found for: "FEV1PRE", "FEV1POST", "FVCPRE", "FVCPOST", "TLC", "DLCOUNC", "PREFEV1FVCRT", "PSTFEV1FVCRT"  US PELVIS TRANSVAGINAL NON-OB (TV ONLY) Result Date: 07/08/2023 Indication:  pelvic cramping, postcoital bleeding. Findings: Pelvic US Uterus 5.32 x 3.55 x 2.51 cm. Fibroids:  Multiple subserosal and intramural fibroids noted: 1.49 cm, 1.02 cm, 1.41 cm, 0.52 cm, 0.68 cm. EMS 1.36 mm.  No masses or thickening.  Sliver of fluid in the canal. Left ovary 1.23 x 0.74 x 0.60 cm. Right ovary 1.17 x 0.84 x. 0.69 cm. No adnexal masses. No free fluid. Impression:  Multifibroid uterus.  Reduction in size of uterine fibroids compared to pelvic ultrasound 03/12/22.  Normal ovaries.     Past medical hx Past Medical History:  Diagnosis Date   Arthritis    cervical and lumbar   Cataract    Mixed OU   Costochondritis 08/27/2020   Depression    GERD (gastroesophageal reflux disease)    Hyperlipidemia    IBS (irritable bowel syndrome)    Osteoporosis    Prediabetes    Vitamin D deficiency      Social History   Tobacco Use   Smoking status: Never    Passive exposure: Never   Smokeless tobacco: Never  Vaping Use   Vaping status: Never Used  Substance Use Topics   Alcohol use: Yes    Comment: Rare   Drug use: No    [name] reports that she has never smoked. She has never been exposed to tobacco smoke. She has never used smokeless tobacco. She reports current alcohol use. She reports that she does not use drugs.  Tobacco Cessation: Never smoker    Past surgical hx, Family hx, Social hx all reviewed.  Current  Outpatient Medications on File Prior to Visit  Medication Sig   Acetaminophen (TYLENOL 8 HOUR PO) Take by mouth.   alum & mag hydroxide-simeth (GERI-LANTA) 200-200-20 MG/5ML suspension Take by mouth.   aspirin 81 MG tablet Take 81 mg by mouth daily.   Bacillus Coagulans-Inulin (ALIGN PREBIOTIC-PROBIOTIC PO) Take 1 tablet by mouth daily.   Cholecalciferol 125 MCG (5000 UT) capsule Take 5,000 Units by mouth daily. Taking 4,000 units   cyclobenzaprine (FLEXERIL) 10 MG tablet Take 1/2 to 1 tablet 3 x /day as needed for Muscle Spasm                                /  TAKE                                         BY                                                 MOUTH   ezetimibe (ZETIA) 10 MG tablet Take 1 tablet Daily  for Cholesterol                              /                                                                   TAKE                                         BY                                                 MOUTH   Fluticasone Propionate (FLONASE ALLERGY RELIEF NA) Place 2 sprays into the nose as needed.   gabapentin (NEURONTIN) 100 MG capsule Take 100 mg by mouth 3 (three) times daily. BID   Hyoscyamine Sulfate (HYOSCYAMINE PO) Take by mouth as needed.   ibuprofen (ADVIL) 400 MG tablet Take 400 mg by mouth every 6 (six) hours as needed.   loratadine (CLARITIN) 10 MG tablet Take 1 tablet (10 mg total) by mouth 2 (two) times daily as needed for allergies (Can take an extra dose during flare ups.). Take 1 tablet 1-2 times daily   Lutein 20 MG TABS Take by mouth.   Magnesium 250 MG TABS    Multiple Vitamin (MULTIVITAMIN) tablet Take 1 tablet by mouth daily.   Polyethylene Glycol 3350 (MIRALAX PO) Take by mouth at bedtime.   RESTASIS 0.05 % ophthalmic emulsion instill 1 drop into both eyes twice a day   triamcinolone (NASACORT) 55 MCG/ACT AERO nasal inhaler Place into the nose.   vitamin B-12 (CYANOCOBALAMIN) 1000 MCG tablet  Take 1,000 mcg by mouth 3 (three) times a week.   No current facility-administered medications on file prior to visit.     Allergies  Allergen Reactions   Bactrim [Sulfamethoxazole-Trimethoprim] Other (See Comments)    Patient preference to take medication without sulfa   Cephalexin Nausea And Vomiting    Rash; chest pain Rash; chest pain   Sulfa Antibiotics     Review Of Systems:  Constitutional:   No  weight loss, night sweats,  Fevers, chills, fatigue, or  lassitude.  HEENT:   No headaches,  Difficulty swallowing,  Tooth/dental problems, or  Sore throat,                No sneezing, itching, ear ache, +nasal congestion, +post nasal drip,  CV:  No chest pain,  Orthopnea, PND, swelling in lower extremities, anasarca, dizziness, palpitations, syncope.   GI  No heartburn, + indigestion, abdominal pain, nausea, vomiting, diarrhea, change in bowel habits, loss of appetite, bloody stools.   Resp: No shortness of breath with exertion or at rest.  No excess mucus, + productive cough,  + non-productive cough,  No coughing up of blood.  No change in color of mucus.  No wheezing.  No chest wall deformity  Skin: no rash or lesions.  GU: no dysuria, change in color of urine, no urgency or frequency.  No flank pain, no hematuria   MS:  No joint pain or swelling.  No decreased range of motion.  No back pain.  Psych:  No change in mood or affect. No depression or anxiety.  No memory loss.   Vital Signs BP 110/69 (BP Location: Left Arm, Patient Position: Sitting, Cuff Size: Large)   Pulse 65   Temp (!) 96.8 F (36 C) (Temporal)   Ht 5\' 5"  (1.651 m)   Wt 143 lb 3.2 oz (65 kg)   SpO2 100%   BMI 23.83 kg/m    Physical Exam:  General- No distress,  A&Ox3, approrpiate ENT: No sinus tenderness, TM clear, pale nasal mucosa, no oral exudate,no post nasal drip, no LAN Cardiac: S1, S2, regular rate and rhythm, no murmur Chest: No wheeze/ rales/ dullness; no accessory muscle use, no nasal  flaring, no sternal retractions, slightly diminished per bases Abd.: Soft Non-tender, ND, BS +, Body mass index is 23.83 kg/m.  Ext: No clubbing cyanosis, edema, no obvious deformities Neuro:  normal strength, MAE x 4, A&O x 3, appropriate Skin: No rashes, warm and dry, No lesions  Psych: normal mood and behavior   Assessment/Plan Pulmonary Nodule Solitary nodule identified on CT scan in December 2024. No history of smoking. Discussed the utility of a PET scan to assess cellular activity and growth of the nodule to determine the need for biopsy. -Schedule PET scan as soon as possible. -Review results within a week of scan completion to determine next steps.  Chronic Cough Reports of both productive and non-productive cough, more so at night. Possible sinus drainage. Noted tree-in-bud reticulation in the right upper lobe on CT scan, which could indicate an atypical infection. -Consider sputum cultures for chronic infection if indicated   Chest Pain Episode of chest pain in December 2024, determined not to be cardiac in origin. Possible reflux or muscle spasms. -Continue current management as per primary care provider.   I spent 30 minutes dedicated to the care of this patient on the date of this encounter to include pre-visit review of records, face-to-face time with the patient discussing conditions above, post visit ordering of testing, clinical documentation with the electronic health record, making appropriate referrals as documented, and communicating necessary information to the patient's healthcare team.    Bevelyn Ngo, NP 08/03/2023  1:59 PM

## 2023-08-03 NOTE — Patient Instructions (Signed)
It is good to see you today. We will order a PET scan to better evaluate the nodule of concern seen on the CT chest done 06/08/2023. You will get a call to get this scheduled. Follow up with me 1 week of the scan. We will review the results and determine best next steps in your plan of care.  Call if you have any questions.  Please contact office for sooner follow up if symptoms do not improve or worsen or seek emergency care

## 2023-08-10 ENCOUNTER — Telehealth: Payer: Self-pay

## 2023-08-10 NOTE — Telephone Encounter (Signed)
 PET f/u appt with Alexandria Lodge, NP scheduled 08/17/2023 has been rescheduled for 09/09/2023. Pt will be out of town and other sooner appts were bad days. Staff will out reach patient if a sooner appt becomes available.

## 2023-08-11 ENCOUNTER — Encounter (HOSPITAL_COMMUNITY): Payer: Medicare Other

## 2023-08-11 ENCOUNTER — Ambulatory Visit (HOSPITAL_COMMUNITY)
Admission: RE | Admit: 2023-08-11 | Discharge: 2023-08-11 | Disposition: A | Discharge status: Home / Self Care | Payer: Medicare Other | Source: Ambulatory Visit | Attending: Acute Care | Admitting: Acute Care

## 2023-08-11 DIAGNOSIS — R911 Solitary pulmonary nodule: Secondary | ICD-10-CM | POA: Diagnosis present

## 2023-08-11 LAB — GLUCOSE, CAPILLARY: Glucose-Capillary: 92 mg/dL (ref 70–99)

## 2023-08-11 MED ORDER — FLUDEOXYGLUCOSE F - 18 (FDG) INJECTION
7.1000 | Freq: Once | INTRAVENOUS | Status: AC
Start: 2023-08-11 — End: 2023-08-11
  Administered 2023-08-11: 7.1 via INTRAVENOUS

## 2023-08-17 ENCOUNTER — Institutional Professional Consult (permissible substitution): Payer: Medicare Other | Admitting: Acute Care

## 2023-09-04 ENCOUNTER — Institutional Professional Consult (permissible substitution): Payer: Medicare Other | Admitting: Acute Care

## 2023-09-09 ENCOUNTER — Ambulatory Visit (INDEPENDENT_AMBULATORY_CARE_PROVIDER_SITE_OTHER): Payer: Medicare Other | Admitting: Acute Care

## 2023-09-09 ENCOUNTER — Encounter: Payer: Self-pay | Admitting: Acute Care

## 2023-09-09 VITALS — BP 107/67 | HR 59 | Ht 64.0 in | Wt 147.0 lb

## 2023-09-09 DIAGNOSIS — J302 Other seasonal allergic rhinitis: Secondary | ICD-10-CM | POA: Diagnosis not present

## 2023-09-09 DIAGNOSIS — R911 Solitary pulmonary nodule: Secondary | ICD-10-CM | POA: Diagnosis not present

## 2023-09-09 DIAGNOSIS — R918 Other nonspecific abnormal finding of lung field: Secondary | ICD-10-CM | POA: Diagnosis not present

## 2023-09-09 NOTE — Progress Notes (Signed)
 History of Present Illness [name] is a 73 y.o. female never smoker referred 07/2023 for lung nodule consult. She will be followed by Dr. Delton Coombes.  PMH Includes Allergies, prediabetes and IBS.  Breast biopsy 2017   Family Hx includes Cancer in her maternal grandmother, Leukemia in her maternal aunt  Synopsis Pt. Seen 08/03/2023 for evaluation of a  persistent spiculated ill-defined nodule in the lingula 18 x 10 mm with retraction of the adjacent posterior interlobar fissure.This nodule was seen on imaging when patient went to the ED 05/2023 for chest pain. PET scan was ordered to better evaluate the nodule.  09/09/2023 Pt. Presents for follow up after PET scan. Pt. States she is doing well. No respiratory issues, but some seasonal allergies. She is not currently taking anything OTC for them.  We have reviewed the PET scan together. The PET shows the left upper lobe nodule we have been following appears less well defined, and  demonstrates low-level FDG activity and may be smaller/less distinct than on 06/08/2023 diagnostic CT. Favored to represent resolving infection. Radiology recommend 3 month follow up to assess for stability. Patient and her husband are in agreement with this plan. This will be due end of May 2025.   We will start OTC Non-sedating antihistamine of patient choice daily, and start Fluticasone nasal spray.   Test Results: PET Scan 08/11/2023 Read 08/31/2023 The posterior left upper lobe/lingular nodule may be slightly less well-defined today, but is difficult to directly compare to the prior diagnostic CT. Example 1.3 x 1.0 cm and a S.U.V. max of 1.8 on 39/7. Compare 1.8 x 1.0 cm on the prior diagnostic CT (when remeasured).   Incidental CT findings: Right-sided thyroid nodule which was detailed on prior diagnostic CT.   ABDOMEN/PELVIS: No abdominopelvic parenchymal or nodal hypermetabolism.   Incidental CT findings: Normal adrenal glands. No renal calculi  or hydronephrosis. Colonic stool burden suggests constipation. No significant free fluid.  The posterior left upper lobe/lingular nodule demonstrates low-level activity and may be smaller/less distinct than on 06/08/2023 diagnostic CT. Favored to represent resolving infection. Recommend chest CT follow-up at 3 months to confirm complete resolution.   CT Chest 06/07/2024 Persistent spiculated nodule in the lingula abutting the interlobar fissure. Although this has a differential, based on appearance and size, would recommend further workup with a PET-CT scan.   There are some other small noncalcified nodules including some areas of tree-in-bud like changes in the lingula and inferior right upper lobe which could be a sequela of atypical infection.   Right thyroid nodule. Please correlate with prior workup and biopsy.      Latest Ref Rng & Units 04/17/2023   11:33 AM 03/16/2023    4:38 PM 02/18/2023    6:59 PM  CBC  WBC 3.8 - 10.8 Thousand/uL 2.7  5.9  3.6   Hemoglobin 11.7 - 15.5 g/dL 29.5  62.1  30.8   Hematocrit 35.0 - 45.0 % 34.8  38.4  34.9   Platelets 140 - 400 Thousand/uL 173  149  155        Latest Ref Rng & Units 04/17/2023   11:33 AM 03/16/2023    4:38 PM 02/18/2023    6:59 PM  BMP  Glucose 65 - 99 mg/dL 84  98  97   BUN 7 - 25 mg/dL 9  15  18    Creatinine 0.60 - 1.00 mg/dL 6.57  8.46  9.62   BUN/Creat Ratio 6 - 22 (calc) SEE NOTE:  Sodium 135 - 146 mmol/L 139  139  137   Potassium 3.5 - 5.3 mmol/L 4.6  3.8  3.8   Chloride 98 - 110 mmol/L 105  103  103   CO2 20 - 32 mmol/L 28  27  23    Calcium 8.6 - 10.4 mg/dL 9.6  9.8  9.5     BNP No results found for: "BNP"  ProBNP    Component Value Date/Time   PROBNP 49.0 05/03/2019 1117    PFT No results found for: "FEV1PRE", "FEV1POST", "FVCPRE", "FVCPOST", "TLC", "DLCOUNC", "PREFEV1FVCRT", "PSTFEV1FVCRT"  NM PET Image Initial (PI) Skull Base To Thigh Result Date: 08/31/2023 CLINICAL DATA:  Initial treatment  strategy for lingular nodule on chest CT. EXAM: NUCLEAR MEDICINE PET SKULL BASE TO THIGH TECHNIQUE: 7.1 mCi F-18 FDG was injected intravenously. Full-ring PET imaging was performed from the skull base to thigh after the radiotracer. CT data was obtained and used for attenuation correction and anatomic localization. Fasting blood glucose: 92 mg/dl COMPARISON:  Chest CT of 06/08/2023. FINDINGS: Mediastinal blood pool activity: SUV max 2.2 Liver activity: SUV max NA NECK: No areas of abnormal hypermetabolism. Incidental CT findings: No cervical adenopathy. CHEST: No thoracic nodal hypermetabolism. The posterior left upper lobe/lingular nodule may be slightly less well-defined today, but is difficult to directly compare to the prior diagnostic CT. Example 1.3 x 1.0 cm and a S.U.V. max of 1.8 on 39/7. Compare 1.8 x 1.0 cm on the prior diagnostic CT (when remeasured). Incidental CT findings: Right-sided thyroid nodule which was detailed on prior diagnostic CT. ABDOMEN/PELVIS: No abdominopelvic parenchymal or nodal hypermetabolism. Incidental CT findings: Normal adrenal glands. No renal calculi or hydronephrosis. Colonic stool burden suggests constipation. No significant free fluid. SKELETON: No abnormal marrow activity. Incidental CT findings: None. IMPRESSION: The posterior left upper lobe/lingular nodule demonstrates low-level activity and may be smaller/less distinct than on 06/08/2023 diagnostic CT. Favored to represent resolving infection. Recommend chest CT follow-up at 3 months to confirm complete resolution. Electronically Signed   By: Jeronimo Greaves M.D.   On: 08/31/2023 12:39     Past medical hx Past Medical History:  Diagnosis Date   Arthritis    cervical and lumbar   Cataract    Mixed OU   Costochondritis 08/27/2020   Depression    GERD (gastroesophageal reflux disease)    Hyperlipidemia    IBS (irritable bowel syndrome)    Osteoporosis    Prediabetes    Vitamin D deficiency      Social  History   Tobacco Use   Smoking status: Never    Passive exposure: Past   Smokeless tobacco: Never  Vaping Use   Vaping status: Never Used  Substance Use Topics   Alcohol use: Yes    Comment: Rare   Drug use: No    Ms.Lauderbaugh reports that she has never smoked. She has been exposed to tobacco smoke. She has never used smokeless tobacco. She reports current alcohol use. She reports that she does not use drugs.  Tobacco Cessation: Never smoker    Past surgical hx, Family hx, Social hx all reviewed.  Current Outpatient Medications on File Prior to Visit  Medication Sig   Acetaminophen (TYLENOL 8 HOUR PO) Take by mouth.   alum & mag hydroxide-simeth (GERI-LANTA) 200-200-20 MG/5ML suspension Take by mouth.   aspirin 81 MG tablet Take 81 mg by mouth daily.   Bacillus Coagulans-Inulin (ALIGN PREBIOTIC-PROBIOTIC PO) Take 1 tablet by mouth daily.   Cholecalciferol 125 MCG (5000 UT) capsule  Take 5,000 Units by mouth daily. Taking 4,000 units   cyclobenzaprine (FLEXERIL) 10 MG tablet Take 1/2 to 1 tablet 3 x /day as needed for Muscle Spasm                                /                                                                   TAKE                                         BY                                                 MOUTH   ezetimibe (ZETIA) 10 MG tablet Take 1 tablet Daily  for Cholesterol                              /                                                                   TAKE                                         BY                                                 MOUTH   Fluticasone Propionate (FLONASE ALLERGY RELIEF NA) Place 2 sprays into the nose as needed.   gabapentin (NEURONTIN) 100 MG capsule Take 100 mg by mouth 3 (three) times daily. BID   Hyoscyamine Sulfate (HYOSCYAMINE PO) Take by mouth as needed.   ibuprofen (ADVIL) 400 MG tablet Take 400 mg by mouth every 6 (six) hours as needed.   loratadine (CLARITIN) 10 MG tablet Take 1 tablet (10 mg total) by  mouth 2 (two) times daily as needed for allergies (Can take an extra dose during flare ups.). Take 1 tablet 1-2 times daily   Lutein 20 MG TABS Take by mouth.   Magnesium 250 MG TABS    Multiple Vitamin (MULTIVITAMIN) tablet Take 1 tablet by mouth daily.   Polyethylene Glycol 3350 (MIRALAX PO) Take by mouth at bedtime.   RESTASIS 0.05 % ophthalmic emulsion instill 1 drop into both eyes twice a day   triamcinolone (NASACORT) 55 MCG/ACT AERO nasal inhaler Place into the nose.   vitamin B-12 (CYANOCOBALAMIN) 1000 MCG tablet Take 1,000 mcg by  mouth 3 (three) times a week.   No current facility-administered medications on file prior to visit.     Allergies  Allergen Reactions   Bactrim [Sulfamethoxazole-Trimethoprim] Other (See Comments)    Patient preference to take medication without sulfa   Cephalexin Nausea And Vomiting    Rash; chest pain Rash; chest pain   Sulfa Antibiotics     Review Of Systems:  Constitutional:   No  weight loss, night sweats,  Fevers, chills, fatigue, or  lassitude.  HEENT:   No headaches,  Difficulty swallowing,  Tooth/dental problems, or  Sore throat,                No sneezing, itching, ear ache, nasal congestion,+ post nasal drip,   CV:  No chest pain,  Orthopnea, PND, swelling in lower extremities, anasarca, dizziness, palpitations, syncope.   GI  No heartburn, indigestion, abdominal pain, nausea, vomiting, diarrhea, change in bowel habits, loss of appetite, bloody stools.   Resp: No shortness of breath with exertion or at rest.  No excess mucus, no productive cough,  No non-productive cough,  No coughing up of blood.  No change in color of mucus.  No wheezing.  No chest wall deformity  Skin: no rash or lesions.  GU: no dysuria, change in color of urine, no urgency or frequency.  No flank pain, no hematuria   MS:  No joint pain or swelling.  No decreased range of motion.  No back pain.  Psych:  No change in mood or affect. No depression or anxiety.   No memory loss.   Vital Signs BP 107/67 (BP Location: Left Arm, Patient Position: Sitting, Cuff Size: Normal)   Pulse (!) 59   Ht 5\' 4"  (1.626 m)   Wt 147 lb (66.7 kg)   SpO2 100%   BMI 25.23 kg/m    Physical Exam:  General- No distress,  A&Ox3, pleasant ENT: No sinus tenderness, TM clear, pale nasal mucosa, no oral exudate,no post nasal drip, no LAN Cardiac: S1, S2, regular rate and rhythm, no murmur Chest: No wheeze/ rales/ dullness; no accessory muscle use, no nasal flaring, no sternal retractions, slightly diminished per bases Abd.: Soft Non-tender, ND, BS +, Body mass index is 25.23 kg/m.  Ext: No clubbing cyanosis, edema Neuro:  normal strength, MAE x 4, A&O x 3 Skin: No rashes, warm and dry, no obvious  lesions  Psych: normal mood and behavior   Assessment/Plan Incidentally found Pulmonary Nodule in never smoker  Seasonal allergies Plan Your PET scan shows the nodule of concern is a bit smaller, and the radiologist feels this is more likely a resolving infection. This is good news. We will do a 3 month follow up CT Chest which will be due the end of May 2025. Call sooner for any blood in your sputum, or unintentional weight  loss.  Try Flonase for sinus allergies.( Generic ok) One spray each nare once daily  Non-sedating antihistamine of your choice daily ( Zyrtec, Allegra, Xyzol, Claritin ( Generic ok)  Please contact office for sooner follow up if symptoms do not improve or worsen or seek emergency care    I spent 25 minutes dedicated to the care of this patient on the date of this encounter to include pre-visit review of records, face-to-face time with the patient discussing conditions above, post visit ordering of testing, clinical documentation with the electronic health record, making appropriate referrals as documented, and communicating necessary information to the patient's healthcare team.   Maralyn Sago  Blenda Bridegroom, NP 09/09/2023  8:58 AM

## 2023-09-09 NOTE — Patient Instructions (Addendum)
 It is good to see you today. Your PET scan shows the nodule of concern is a bit smaller, and the radiologist feels this is more likely a resolving infection. This is good news. We will do a 3 month follow up CT Chest which will be due the end of May 2025. Call sooner for any blood in your sputum, or unintentional weight  loss.  Try Flonase for sinus allergies.( Generic ok) One spray each nare once daily  Non-sedating antihistamine of your choice daily ( Zyrtec, Allegra, Xyzol, Claritin ( Generic ok)  Please contact office for sooner follow up if symptoms do not improve or worsen or seek emergency care

## 2023-10-01 ENCOUNTER — Encounter (HOSPITAL_BASED_OUTPATIENT_CLINIC_OR_DEPARTMENT_OTHER): Payer: Self-pay | Admitting: Emergency Medicine

## 2023-10-01 ENCOUNTER — Emergency Department (HOSPITAL_BASED_OUTPATIENT_CLINIC_OR_DEPARTMENT_OTHER)

## 2023-10-01 ENCOUNTER — Emergency Department (HOSPITAL_BASED_OUTPATIENT_CLINIC_OR_DEPARTMENT_OTHER)
Admission: EM | Admit: 2023-10-01 | Discharge: 2023-10-01 | Disposition: A | Discharge status: Home / Self Care | Attending: Emergency Medicine | Admitting: Emergency Medicine

## 2023-10-01 ENCOUNTER — Other Ambulatory Visit: Payer: Self-pay

## 2023-10-01 DIAGNOSIS — R200 Anesthesia of skin: Secondary | ICD-10-CM | POA: Diagnosis present

## 2023-10-01 LAB — CBC WITH DIFFERENTIAL/PLATELET
Abs Immature Granulocytes: 0.01 10*3/uL (ref 0.00–0.07)
Basophils Absolute: 0 10*3/uL (ref 0.0–0.1)
Basophils Relative: 0 %
Eosinophils Absolute: 0.1 10*3/uL (ref 0.0–0.5)
Eosinophils Relative: 3 %
HCT: 36.4 % (ref 36.0–46.0)
Hemoglobin: 12.2 g/dL (ref 12.0–15.0)
Immature Granulocytes: 0 %
Lymphocytes Relative: 61 %
Lymphs Abs: 1.6 10*3/uL (ref 0.7–4.0)
MCH: 28 pg (ref 26.0–34.0)
MCHC: 33.5 g/dL (ref 30.0–36.0)
MCV: 83.7 fL (ref 80.0–100.0)
Monocytes Absolute: 0.3 10*3/uL (ref 0.1–1.0)
Monocytes Relative: 10 %
Neutro Abs: 0.7 10*3/uL — ABNORMAL LOW (ref 1.7–7.7)
Neutrophils Relative %: 26 %
Platelets: 154 10*3/uL (ref 150–400)
RBC: 4.35 MIL/uL (ref 3.87–5.11)
RDW: 13.7 % (ref 11.5–15.5)
WBC: 2.7 10*3/uL — ABNORMAL LOW (ref 4.0–10.5)
nRBC: 0 % (ref 0.0–0.2)

## 2023-10-01 LAB — PATHOLOGIST SMEAR REVIEW

## 2023-10-01 LAB — COMPREHENSIVE METABOLIC PANEL WITH GFR
ALT: 15 U/L (ref 0–44)
AST: 20 U/L (ref 15–41)
Albumin: 3.9 g/dL (ref 3.5–5.0)
Alkaline Phosphatase: 99 U/L (ref 38–126)
Anion gap: 7 (ref 5–15)
BUN: 19 mg/dL (ref 8–23)
CO2: 26 mmol/L (ref 22–32)
Calcium: 9.6 mg/dL (ref 8.9–10.3)
Chloride: 103 mmol/L (ref 98–111)
Creatinine, Ser: 0.76 mg/dL (ref 0.44–1.00)
GFR, Estimated: >60 mL/min (ref >60)
Glucose, Bld: 97 mg/dL (ref 70–99)
Potassium: 3.8 mmol/L (ref 3.5–5.1)
Sodium: 136 mmol/L (ref 135–145)
Total Bilirubin: 0.3 mg/dL (ref 0.0–1.2)
Total Protein: 6.9 g/dL (ref 6.5–8.1)

## 2023-10-01 MED ORDER — LORAZEPAM 1 MG PO TABS
1.0000 mg | ORAL_TABLET | Freq: Once | ORAL | Status: AC
  Administered 2023-10-01: 1 mg via ORAL
  Filled 2023-10-01: qty 1

## 2023-10-01 NOTE — ED Provider Notes (Signed)
 [facility] Emergency Department Provider Note MRN:  [id number]  Arrival date & time: 10/01/23     Chief Complaint   Numbness   History of Present Illness   [name] is a 73 y.o. year-old female with no pertinent past history presenting to the ED with chief complaint of numbness.  Decreased sensation to some areas of the right side of the face for the past few weeks.  Seems to happen only at night during sleep, will wake up intermittently with this decrease sensation to the lateral right periorbital region as well as the right upper lip.  When she wakes up for the day is usually not there and during the day she does not experience the sensory disturbance.  This morning it is a bit more pronounced and does not seem to be going away.  Denies pain to the face but does endorse some mild right lateral neck pain intermittently.  No visual disturbance, no trouble with speech or swallowing, no numbness or weakness to the arms or legs, some chronic toe numbness to bilateral feet past several months that is unchanged.  Review of Systems  A thorough review of systems was obtained and all systems are negative except as noted in the HPI and PMH.   Patient's Health History    Past Medical History:  Diagnosis Date   Arthritis    cervical and lumbar   Cataract    Mixed OU   Costochondritis 08/27/2020   Depression    GERD (gastroesophageal reflux disease)    Hyperlipidemia    IBS (irritable bowel syndrome)    Osteoporosis    Prediabetes    Vitamin D deficiency     Past Surgical History:  Procedure Laterality Date   CARDIAC CATHETERIZATION  2011   normal (Dr. Sharyn Lull)   FOOT SURGERY Right 02/2023   Removal of bunion and hammer toe   RADIOACTIVE SEED GUIDED EXCISIONAL BREAST BIOPSY Right 03/11/2016   Procedure: RIGHT RADIOACTIVE SEED GUIDED EXCISIONAL BREAST BIOPSY;  Surgeon: Emelia Loron, MD;  Location: [facility];  Service: General;   Laterality: Right;    Family History  Problem Relation Age of Onset   Hypertension Mother    Other Mother        surgeries involving upper back   Diabetes Father    Heart failure Father    Cancer Maternal Grandmother        Lung   Leukemia Maternal Aunt    Allergic rhinitis Neg Hx    Angioedema Neg Hx    Asthma Neg Hx    Atopy Neg Hx    Eczema Neg Hx    Immunodeficiency Neg Hx    Urticaria Neg Hx    Migraines Neg Hx     Social History   Socioeconomic History   Marital status: Married    Spouse name: Not on file   Number of children: 0   Years of education: Not on file   Highest education level: Not on file  Occupational History   Occupation: retired Mining engineer  Tobacco Use   Smoking status: Never    Passive exposure: Past   Smokeless tobacco: Never  Vaping Use   Vaping status: Never Used  Substance and Sexual Activity   Alcohol use: Yes    Comment: Rare   Drug use: No   Sexual activity: Yes    Birth control/protection: Post-menopausal    Comment: 1st intercourse 51 yo-5 partners  Other Topics Concern  Not on file  Social History Narrative   Lives at home with spouse   Right handed   Caffeine: not much, drinks decaf 2 cups/day   Social Drivers of Corporate investment banker Strain: Not on file  Food Insecurity: Low Risk  (04/30/2023)   Received from [facility]   Hunger Vital Sign    Worried About Running Out of Food in the Last Year: Never true    Ran Out of Food in the Last Year: Never true  Transportation Needs: No Transportation Needs (04/30/2023)   Received from [facility]    In the past 12 months, has lack of reliable transportation kept you from medical appointments, meetings, work or from getting things needed for daily living? : No  Physical Activity: Not on file  Stress: Not on file  Social Connections: Not on file  Intimate Partner Violence: Not on file     Physical Exam   Vitals:   10/01/23 0558  10/01/23 0615  BP: 117/69 125/61  Pulse: 61   Resp: 18 15  Temp: 97.9 F (36.6 C)   SpO2: 100%     CONSTITUTIONAL: Well-appearing, NAD NEURO/PSYCH:  Alert and oriented x 3, no focal deficits EYES:  eyes equal and reactive ENT/NECK:  no LAD, no JVD CARDIO: Regular rate, well-perfused, normal S1 and S2 PULM:  CTAB no wheezing or rhonchi GI/GU:  non-distended, non-tender MSK/SPINE:  No gross deformities, no edema SKIN:  no rash, atraumatic   *Additional and/or pertinent findings included in MDM below  Diagnostic and Interventional Summary    EKG Interpretation Date/Time:  Thursday October 01 2023 06:03:47 EDT Ventricular Rate:  57 PR Interval:  157 QRS Duration:  89 QT Interval:  419 QTC Calculation: 408 R Axis:   30  Text Interpretation: Sinus rhythm Ventricular trigeminy Confirmed by Gwenetta Lennert [phone]) on 10/01/2023 7:17:04 AM       Labs Reviewed  CBC WITH DIFFERENTIAL/PLATELET - Abnormal; Notable for the following components:      Result Value   WBC 2.7 (*)    All other components within normal limits  COMPREHENSIVE METABOLIC PANEL WITH GFR    CT HEAD WO CONTRAST ( )  Final Result    CT CERVICAL SPINE WO CONTRAST  Final Result    MR BRAIN WO CONTRAST    (Results Pending)    Medications - No data to display   Procedures  /  Critical Care Procedures  ED Course and Medical Decision Making  Initial Impression and Ddx Sensory disturbance to the right side of the face intermittently for the past few weeks, becoming more constant this morning.  Would be an atypical presentation of a stroke, seems very focal, seems somewhat positional given the relationship with sleep.  Past medical/surgical history that increases complexity of ED encounter: None  Interpretation of Diagnostics I personally reviewed the EKG and my interpretation is as follows: Sinus rhythm, occasional PVC  Labs pending  Patient Reassessment and Ultimate Disposition/Management     CT  imaging largely unremarkable, shared decision making utilized and will obtain MRI to exclude small stroke.  Signed out to oncoming provider at shift change.  Patient management required discussion with the following services or consulting groups:  None  Complexity of Problems Addressed Acute illness or injury that poses threat of life of bodily function  Additional Data Reviewed and Analyzed Further history obtained from: Further history from spouse/family member  Additional Factors Impacting ED Encounter Risk Consideration of hospitalization  Bambi Lever  Earlis Glimpse, MD [facility] Emergency Medicine [facility] [email or web address]  Final Clinical Impressions(s) / ED Diagnoses     ICD-10-CM   1. Facial numbness  R20.0       ED Discharge Orders     None        Discharge Instructions Discussed with and Provided to Patient:   Discharge Instructions   None      Edson Graces, MD 10/01/23 [phone]

## 2023-10-01 NOTE — ED Notes (Signed)
 Pt undressed and placed in gown for MRI. Medicated with PO ativan for claustrophobia.

## 2023-10-01 NOTE — ED Notes (Signed)
 Transport to MRI

## 2023-10-01 NOTE — ED Triage Notes (Signed)
 Pt coming in from home via POV after waking up and realizing the right side of her face from the right cheek up around the right eye. 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 Pt states this has happened several times in the last 2 weeks.

## 2023-10-01 NOTE — ED Notes (Signed)
 Dc instructions reviewed with patient. Patient voiced understanding. Dc with belongings. Pt instructed to speak with registration regarding scheduling appointment with a primary MD.

## 2023-10-01 NOTE — Discharge Instructions (Signed)
 Your MRI and your CT scans that were done were normal.  Your blood work did show that your white blood cell count is a bit low.  I would like you to follow-up with your primary care doctor for further testing on this.  Please follow-up with your PCP within 1 to 2 weeks.

## 2023-10-01 NOTE — ED Provider Notes (Signed)
  Physical Exam  BP 110/62   Pulse (!) 54   Temp 97.9 F (36.6 C) (Oral)   Resp 16   Ht 5\' 4"  (1.626 m)   Wt 65.8 kg   SpO2 100%   BMI 24.89 kg/m   Physical Exam  Procedures  Procedures  ED Course / MDM    Medical Decision Making Amount and/or Complexity of Data Reviewed Labs: ordered. Radiology: ordered.  Risk Prescription drug management.   [name], assumed care for this patient.  In brief 73 year old female here today for some facial numbness that was intermittent and sporadic.  Patient was signed out pending MRI.  Patient's MRI negative.  Did have to provide her with some Ativan to facilitate MRI, patient a bit sleepy upon my reassessment.  Patient's labs noted for leukopenia.  This was consistent with previous.  I discussed this with the patient when she was more awake, and she says that her PCP had observed this.  I am encouraging the patient to follow-up with her PCP for further workup.  Will discharge patient.  Patient ambulatory at time of discharge, well-appearing.       [name], DO 10/01/23 1000

## 2023-10-15 DIAGNOSIS — R7303 Prediabetes: Secondary | ICD-10-CM

## 2023-10-15 HISTORY — DX: Prediabetes: R73.03

## 2023-10-16 HISTORY — PX: FOOT SURGERY: SHX648

## 2023-10-30 NOTE — Progress Notes (Signed)
 73 y.o. G0P0000 Married [place] American female here for a breast and pelvic exam.    The patient is also followed for vaginal atrophy and post coital bleeding. Has occasional pink bleeding.  No pain.   She has had US  and EMB done.  Known fibroids and normal ovaries.  EMB was benign inactive endometrium.   Has some left lower quadrant pain.  She has seen neurology and may do a nerve block.  Has surgeries on her right foot.  Wearing a boot.   She is prediabetic and worried about potential diabetes.  She wants to check her A1C.   She is being followed for pulmonology for a spot on her lung.   Her PCP passed away and she is searching for a new provider.   PCP: Vangie Genet, MD   No LMP recorded. Patient is postmenopausal.           Sexually active: Yes.    The current method of family planning is post menopausal status.    Menopausal hormone therapy:  n/a Exercising: Yes.    Workout class at the [employer] & walking Smoker:  no  OB History     Gravida  0   Para  0   Term  0   Preterm  0   AB  0   Living  0      SAB  0   IAB  0   Ectopic  0   Multiple  0   Live Births  0           HEALTH MAINTENANCE: Last 2 paps: 05/13/23 neg HR HPV neg, 04/13/20 neg  History of abnormal Pap or positive HPV:  no Mammogram:  02/25/23 Breast Density Cat D, BI-RADS CAT 1 neg  Colonoscopy:  10/18/18 Bone Density:  03/05/22  Result  osteoporosis   Immunization History  Administered Date(s) Administered   Influenza, High Dose Seasonal PF 06/25/2017   Influenza-Unspecified 03/24/2018, 03/29/2019, 03/05/2020, 04/09/2023   PFIZER Comirnaty (Gray Top)Covid-19 Tri-Sucrose Vaccine 12/26/2020   PFIZER(Purple Top)SARS-COV-2 Vaccination 07/08/2019, 07/29/2019, 03/20/2020   PPD Test 12/12/2014, 12/26/2015, 01/15/2017   Pfizer(Comirnaty )Fall Seasonal Vaccine 12 years and older 11/07/2022, 07/07/2023   Pneumococcal Conjugate-13 11/10/2018   Pneumococcal Polysaccharide-23  10/02/2021   Td 05/18/2007   Zoster, Live 05/17/2006      reports that she has never smoked. She has been exposed to tobacco smoke. She has never used smokeless tobacco. She reports current alcohol use. She reports that she does not use drugs.  Past Medical History:  Diagnosis Date   Arthritis    cervical and lumbar   Cataract    Mixed OU   Costochondritis 08/27/2020   Depression    GERD (gastroesophageal reflux disease)    Hyperlipidemia    IBS (irritable bowel syndrome)    Osteoporosis    Prediabetes    Vitamin D  deficiency     Past Surgical History:  Procedure Laterality Date   CARDIAC CATHETERIZATION  2011   normal (Dr. Glena Landau)   FOOT SURGERY Right 02/2023   Removal of bunion and hammer toe   RADIOACTIVE SEED GUIDED EXCISIONAL BREAST BIOPSY Right 03/11/2016   Procedure: RIGHT RADIOACTIVE SEED GUIDED EXCISIONAL BREAST BIOPSY;  Surgeon: Enid Harry, MD;  Location: [facility];  Service: General;  Laterality: Right;    Current Outpatient Medications  Medication Sig Dispense Refill   Acetaminophen  (TYLENOL  8 HOUR PO) Take by mouth.     alum & mag hydroxide-simeth (GERI-LANTA) 200-200-20 MG/5ML suspension Take by  mouth.     aspirin  81 MG tablet Take 81 mg by mouth daily.     Bacillus Coagulans-Inulin (ALIGN PREBIOTIC-PROBIOTIC PO) Take 1 tablet by mouth daily.     Cholecalciferol  125 MCG (5000 UT) capsule Take 5,000 Units by mouth daily. Taking 4,000 units     ezetimibe  (ZETIA ) 10 MG tablet Take 1 tablet Daily  for Cholesterol                              /                                                                   TAKE                                         BY                                                 MOUTH 90 tablet 3   loratadine  (CLARITIN ) 10 MG tablet Take 1 tablet (10 mg total) by mouth 2 (two) times daily as needed for allergies (Can take an extra dose during flare ups.). Take 1 tablet 1-2 times daily 60 tablet 11   Lutein 20 MG TABS  Take by mouth.     Magnesium  250 MG TABS      Multiple Vitamin (MULTIVITAMIN) tablet Take 1 tablet by mouth daily.     Polyethylene Glycol 3350 (MIRALAX PO) Take by mouth at bedtime.     RESTASIS  0.05 % ophthalmic emulsion instill 1 drop into both eyes twice a day  0   vitamin B-12 (CYANOCOBALAMIN) 1000 MCG tablet Take 1,000 mcg by mouth 3 (three) times a week.     cyclobenzaprine  (FLEXERIL ) 10 MG tablet Take 1/2 to 1 tablet 3 x /day as needed for Muscle Spasm                                /                                                                   TAKE                                         BY                                                 MOUTH (Patient not taking: Reported on 11/03/2023) 90 tablet 3  Fluticasone  Propionate (FLONASE  ALLERGY  RELIEF NA) Place 2 sprays into the nose as needed. (Patient not taking: Reported on 11/03/2023)     gabapentin  (NEURONTIN ) 100 MG capsule Take 100 mg by mouth 3 (three) times daily. BID (Patient not taking: Reported on 11/03/2023)     Hyoscyamine Sulfate (HYOSCYAMINE PO) Take by mouth as needed. (Patient not taking: Reported on 11/03/2023)     ibuprofen (ADVIL) 400 MG tablet Take 400 mg by mouth every 6 (six) hours as needed. (Patient not taking: Reported on 11/03/2023)     triamcinolone  (NASACORT ) 55 MCG/ACT AERO nasal inhaler Place into the nose. (Patient not taking: Reported on 11/03/2023)     No current facility-administered medications for this visit.    ALLERGIES: Bactrim [sulfamethoxazole-trimethoprim], Cephalexin , and Sulfa antibiotics  Family History  Problem Relation Age of Onset   Hypertension Mother    Other Mother        surgeries involving upper back   Diabetes Father    Heart failure Father    Cancer Maternal Grandmother        Lung   Leukemia Maternal Aunt    Allergic rhinitis Neg Hx    Angioedema Neg Hx    Asthma Neg Hx    Atopy Neg Hx    Eczema Neg Hx    Immunodeficiency Neg Hx    Urticaria Neg Hx    Migraines Neg Hx      Review of Systems  All other systems reviewed and are negative.   PHYSICAL EXAM:  BP 110/62 (BP Location: Left Arm, Patient Position: Sitting)   Pulse 72   Ht 5\' 5"  (1.651 m) Comment: with shoes  Wt 144 lb (65.3 kg) Comment: with shoes  SpO2 96%   BMI 23.96 kg/m     General appearance: alert, cooperative and appears stated age Head: normocephalic, without obvious abnormality, atraumatic Neck: no adenopathy, supple, symmetrical, trachea midline and thyroid  normal to inspection and palpation Lungs: clear to auscultation bilaterally Breasts: normal appearance, no masses or tenderness, No nipple retraction or dimpling, No nipple discharge or bleeding, No axillary adenopathy Heart: regular rate and rhythm Abdomen: soft, non-tender; no masses, no organomegaly Extremities: extremities normal, atraumatic, no cyanosis or edema Skin: skin color, texture, turgor normal. No rashes or lesions Lymph nodes: cervical, supraclavicular, and axillary nodes normal. Neurologic: grossly normal  Pelvic: External genitalia:  no lesions              No abnormal inguinal nodes palpated.              Urethra:  normal appearing urethra with no masses, tenderness or lesions              Bartholins and Skenes: normal                 Vagina: normal appearing vagina with normal color and discharge, no lesions              Cervix: no lesions              Pap taken: no Bimanual Exam:  Uterus:  normal size, contour, position, consistency, mobility, non-tender              Adnexa: no mass, fullness, tenderness              Rectal exam: yes.  Confirms.              Anus:  normal sphincter tone, no lesions  Chaperone was present for exam:  Alesia Husky,  CMA  ASSESSMENT: Encounter for breast and pelvic exam.  Postcoital bleeding.  Atrophy.  STD screening.  Uterine fibroids.  IBS.  Prediabetes.   PLAN: Mammogram screening discussed. Self breast awareness reviewed. Pap and HRV collected:  no.  Due in 2029.   Guidelines for Calcium , Vitamin D , regular exercise program including cardiovascular and weight bearing exercise. Medication refills:  Rx for Vagifem .  Instructed in use.  I discussed potential effect on breast cancer.  Nuswab fro GC/CT/trich testing.  Return for A1C.  Follow up:  yearly and prn.     Additional counseling given.  yes. 30 min  total time was spent for this patient encounter, including preparation, face-to-face counseling with the patient, coordination of care, and documentation of the encounter in addition to doing the breast and pelvic exam.

## 2023-11-02 ENCOUNTER — Ambulatory Visit: Payer: Medicare Other | Admitting: Internal Medicine

## 2023-11-03 ENCOUNTER — Other Ambulatory Visit (HOSPITAL_COMMUNITY)
Admission: RE | Admit: 2023-11-03 | Discharge: 2023-11-03 | Disposition: A | Discharge status: Home / Self Care | Source: Ambulatory Visit | Attending: Obstetrics and Gynecology | Admitting: Obstetrics and Gynecology

## 2023-11-03 ENCOUNTER — Ambulatory Visit (INDEPENDENT_AMBULATORY_CARE_PROVIDER_SITE_OTHER): Payer: Medicare Other | Admitting: Obstetrics and Gynecology

## 2023-11-03 ENCOUNTER — Encounter: Payer: Self-pay | Admitting: Obstetrics and Gynecology

## 2023-11-03 VITALS — BP 110/62 | HR 72 | Ht 65.0 in | Wt 144.0 lb

## 2023-11-03 DIAGNOSIS — R7303 Prediabetes: Secondary | ICD-10-CM

## 2023-11-03 DIAGNOSIS — Z01419 Encounter for gynecological examination (general) (routine) without abnormal findings: Secondary | ICD-10-CM | POA: Diagnosis not present

## 2023-11-03 DIAGNOSIS — N952 Postmenopausal atrophic vaginitis: Secondary | ICD-10-CM | POA: Diagnosis not present

## 2023-11-03 DIAGNOSIS — Z113 Encounter for screening for infections with a predominantly sexual mode of transmission: Secondary | ICD-10-CM | POA: Diagnosis present

## 2023-11-03 DIAGNOSIS — N930 Postcoital and contact bleeding: Secondary | ICD-10-CM | POA: Diagnosis not present

## 2023-11-03 MED ORDER — ESTRADIOL 10 MCG VA TABS: 1.0000 | ORAL_TABLET | VAGINAL | 3 refills | Status: DC

## 2023-11-03 NOTE — Patient Instructions (Signed)

## 2023-11-04 ENCOUNTER — Ambulatory Visit: Payer: Self-pay | Admitting: Obstetrics and Gynecology

## 2023-11-04 ENCOUNTER — Other Ambulatory Visit

## 2023-11-04 DIAGNOSIS — R7303 Prediabetes: Secondary | ICD-10-CM

## 2023-11-04 LAB — CERVICOVAGINAL ANCILLARY ONLY
Chlamydia: NEGATIVE
Comment: NEGATIVE
Comment: NEGATIVE
Comment: NORMAL
Neisseria Gonorrhea: NEGATIVE
Trichomonas: NEGATIVE

## 2023-11-05 LAB — HEMOGLOBIN A1C
Hgb A1c MFr Bld: 5.8 % — ABNORMAL HIGH (ref <5.7)
Mean Plasma Glucose: 120 mg/dL
eAG (mmol/L): 6.6 mmol/L

## 2023-11-11 ENCOUNTER — Ambulatory Visit
Admission: RE | Admit: 2023-11-11 | Discharge: 2023-11-11 | Disposition: A | Discharge status: Home / Self Care | Source: Ambulatory Visit | Attending: Acute Care | Admitting: Acute Care

## 2023-11-11 DIAGNOSIS — R911 Solitary pulmonary nodule: Secondary | ICD-10-CM

## 2023-12-02 ENCOUNTER — Encounter: Payer: Self-pay | Admitting: Acute Care

## 2023-12-02 ENCOUNTER — Ambulatory Visit: Admitting: Acute Care

## 2023-12-02 ENCOUNTER — Encounter: Payer: Self-pay | Admitting: Emergency Medicine

## 2023-12-02 VITALS — BP 96/52 | HR 57 | Ht 64.0 in | Wt 148.4 lb

## 2023-12-02 DIAGNOSIS — Z789 Other specified health status: Secondary | ICD-10-CM

## 2023-12-02 DIAGNOSIS — R911 Solitary pulmonary nodule: Secondary | ICD-10-CM

## 2023-12-02 NOTE — Progress Notes (Signed)
 New patient visit   Patient: [name]   DOB: [birth year]   73 y.o. Female  MRN: [id number] Visit Date: 12/09/2023  Today's healthcare provider: Manuelita Flatness, PA-C   Cc. New patient, establish care  Subjective    [name] is a 73 y.o. female who presents today as a new patient to establish care.   Discussed the use of AI scribe software for clinical note transcription with the patient, who gave verbal consent to proceed.  History of Present Illness   [name] is a 73 year old female with recent history of lung bx, who presents with musculoskeletal pain and facial numbness.  She experiences musculoskeletal pain along the side of her pelvis, which improved after discontinuing Zetia . She manages cholesterol through diet and exercise, including Silver Sneakers.She has previously declined statins.   Facial numbness occurs primarily at night on the right side, starting four to five weeks ago. It extends from under her eye to her cheek when lying down. There is no associated pain or tingling. Numbness always resolves in the short term. Denies numbness during the day. No associated limb weakness, speech changes, facial asymmetry.   A rash on the side of her body has been present for three to four days. Hydrocortisone cream has helped, but slight itching persists without raised bumps.   She has a history of lung issues and is awaiting biopsy results. Her husband recently had lung cancer and surgery.        Past Medical History:  Diagnosis Date   Arthritis    cervical and lumbar   Cataract    Mixed OU - MD just watching   Costochondritis 08/27/2020   Depression    GERD (gastroesophageal reflux disease)    Hyperlipidemia    IBS (irritable bowel syndrome)    Osteoporosis    Pre-diabetes 10/2023   Vitamin D  deficiency    Past Surgical History:  Procedure Laterality Date   CARDIAC CATHETERIZATION  2011   normal (Dr. Levern)   COLONOSCOPY  10/18/2018   FOOT  SURGERY Right 02/2023   Removal of bunion and hammer toe   FOOT SURGERY Right 10/16/2023   removed a screw   RADIOACTIVE SEED GUIDED EXCISIONAL BREAST BIOPSY Right 03/11/2016   Procedure: RIGHT RADIOACTIVE SEED GUIDED EXCISIONAL BREAST BIOPSY;  Surgeon: Donnice Bury, MD;  Location: [facility];  Service: General;  Laterality: Right;   UPPER GI ENDOSCOPY  12/2019   Family Status  Relation Name Status   Mother  Alive   Father  Deceased at age 21   Brother  Alive   Brother  Alive   MGM  Deceased   MGF  Deceased   PGM  Deceased   PGF  Deceased   Mat Aunt  Alive   Neg Hx  (Not Specified)  No partnership data on file   Family History  Problem Relation Age of Onset   Hypertension Mother    Other Mother        surgeries involving upper back   Diabetes Father    Heart failure Father    Cancer Maternal Grandmother        Lung   Leukemia Maternal Aunt    Allergic rhinitis Neg Hx    Angioedema Neg Hx    Asthma Neg Hx    Atopy Neg Hx    Eczema Neg Hx    Immunodeficiency Neg Hx    Urticaria Neg Hx    Migraines Neg Hx  Social History   Socioeconomic History   Marital status: Married    Spouse name: Not on file   Number of children: 0   Years of education: Not on file   Highest education level: Not on file  Occupational History   Occupation: retired Mining engineer  Tobacco Use   Smoking status: Never    Passive exposure: Past   Smokeless tobacco: Never  Vaping Use   Vaping status: Never Used  Substance and Sexual Activity   Alcohol use: Yes    Comment: Rare Beer   Drug use: No   Sexual activity: Yes    Birth control/protection: Post-menopausal    Comment: 1st intercourse 4 yo-5 partners  Other Topics Concern   Not on file  Social History Narrative   Lives at home with spouse   Right handed   Caffeine: not much, drinks decaf 2 cups/day   Social Drivers of Corporate investment banker Strain: Not on file  Food Insecurity: Low Risk   (04/30/2023)   Received from [facility]   Hunger Vital Sign    Within the past 12 months, you worried that your food would run out before you got money to buy more: Never true    Within the past 12 months, the food you bought just didn't last and you didn't have money to get more. : Never true  Transportation Needs: No Transportation Needs (04/30/2023)   Received from [facility]    In the past 12 months, has lack of reliable transportation kept you from medical appointments, meetings, work or from getting things needed for daily living? : No  Physical Activity: Not on file  Stress: Not on file  Social Connections: Not on file   Outpatient Medications Prior to Visit  Medication Sig   Acetaminophen  (TYLENOL  8 HOUR PO) Take by mouth.   alum & mag hydroxide-simeth (GERI-LANTA) 200-200-20 MG/5ML suspension Take by mouth.   aspirin  81 MG tablet Take 81 mg by mouth daily.   Bacillus Coagulans-Inulin (ALIGN PREBIOTIC-PROBIOTIC PO) Take 1 tablet by mouth daily.   Cholecalciferol  125 MCG (5000 UT) capsule Take 5,000 Units by mouth daily. Taking 4,000 units   Estradiol  (VAGIFEM ) 10 MCG TABS vaginal tablet Place 1 tablet (10 mcg total) vaginally 2 (two) times a week. Use every night before bed for two weeks when you first begin this medicine, then after the first two weeks, begin using it twice a week.   Hyoscyamine Sulfate (HYOSCYAMINE PO) Take by mouth as needed.   ibuprofen (ADVIL) 400 MG tablet Take 400 mg by mouth every 6 (six) hours as needed.   loratadine  (CLARITIN ) 10 MG tablet Take 1 tablet (10 mg total) by mouth 2 (two) times daily as needed for allergies (Can take an extra dose during flare ups.). Take 1 tablet 1-2 times daily   Lutein 20 MG TABS Take by mouth.   Magnesium  250 MG TABS    Multiple Vitamin (MULTIVITAMIN) tablet Take 1 tablet by mouth daily.   Polyethylene Glycol 3350 (MIRALAX PO) Take by mouth at bedtime.   RESTASIS  0.05 % ophthalmic emulsion instill 1  drop into both eyes twice a day   vitamin B-12 (CYANOCOBALAMIN) 1000 MCG tablet Take 1,000 mcg by mouth 3 (three) times a week.   cyclobenzaprine  (FLEXERIL ) 10 MG tablet Take 1/2 to 1 tablet 3 x /day as needed for Muscle Spasm                                /  TAKE                                         BY                                                 MOUTH (Patient not taking: Reported on 12/09/2023)   ezetimibe  (ZETIA ) 10 MG tablet Take 1 tablet Daily  for Cholesterol                              /                                                                   TAKE                                         BY                                                 MOUTH (Patient not taking: Reported on 12/09/2023)   Fluticasone  Propionate (FLONASE  ALLERGY  RELIEF NA) Place 2 sprays into the nose as needed. (Patient not taking: Reported on 12/09/2023)   No facility-administered medications prior to visit.   Allergies  Allergen Reactions   Bactrim [Sulfamethoxazole-Trimethoprim] Other (See Comments)    Patient preference to take medication without sulfa   Cephalexin  Nausea And Vomiting    Rash; chest pain Rash; chest pain   Sulfa Antibiotics     Immunization History  Administered Date(s) Administered   Influenza, High Dose Seasonal PF 06/25/2017   Influenza-Unspecified 03/24/2018, 03/29/2019, 03/05/2020, 04/09/2023   PFIZER Comirnaty (Gray Top)Covid-19 Tri-Sucrose Vaccine 12/26/2020   PFIZER(Purple Top)SARS-COV-2 Vaccination 07/08/2019, 07/29/2019, 03/20/2020   PPD Test 12/12/2014, 12/26/2015, 01/15/2017   Pfizer(Comirnaty )Fall Seasonal Vaccine 12 years and older 11/07/2022, 07/07/2023   Pneumococcal Conjugate-13 11/10/2018   Pneumococcal Polysaccharide-23 10/02/2021   Td 05/18/2007   Zoster, Live 05/17/2006    Health Maintenance  Topic Date Due   Zoster Vaccines- Shingrix (1 of 2) 04/18/1971   DTaP/Tdap/Td (2 - Tdap)  05/17/2017   Medicare Annual Wellness (AWV)  10/03/2022   COVID-19 Vaccine (7 - Pfizer risk 2024-25 season) 01/04/2024   INFLUENZA VACCINE  01/15/2024   MAMMOGRAM  02/25/2024   DEXA SCAN  03/05/2024   Colonoscopy  10/17/2028   Pneumococcal Vaccine: 50+ Years  Completed   Hepatitis C Screening  Completed   Hepatitis B Vaccines  Aged Out   HPV VACCINES  Aged Out   Meningococcal B Vaccine  Aged Out    Patient Care Team: Cyndi Shaver, PA-C as PCP - General (Physician Assistant) Kristie Lamprey, MD as Consulting Physician (Gastroenterology) Cleatus Collar, MD as Consulting Physician (Ophthalmology) Levern Hutching, MD as Consulting Physician (Cardiology)  Review of Systems  Constitutional:  Negative for fatigue and fever.  Respiratory:  Negative for cough and shortness of breath.   Cardiovascular:  Negative for chest pain and leg swelling.  Gastrointestinal:  Negative for abdominal pain.  Musculoskeletal:  Positive for myalgias.  Skin:  Positive for rash.  Neurological:  Positive for numbness. Negative for dizziness and headaches.        Objective    BP 104/70 (BP Location: Right Arm, Patient Position: Sitting, Cuff Size: Normal)   Pulse 63   Temp 98.2 F (36.8 C) (Oral)   Resp 16   Ht 5' 4 (1.626 m)   Wt 146 lb 9.6 oz (66.5 kg)   SpO2 100%   BMI 25.16 kg/m     Physical Exam Constitutional:      General: She is awake.     Appearance: She is well-developed.  HENT:     Head: Normocephalic.     Right Ear: Tympanic membrane normal.   Eyes:     Conjunctiva/sclera: Conjunctivae normal.    Cardiovascular:     Rate and Rhythm: Normal rate and regular rhythm.     Heart sounds: Normal heart sounds.  Pulmonary:     Effort: Pulmonary effort is normal.     Breath sounds: Normal breath sounds.   Skin:    General: Skin is warm.   Neurological:     General: No focal deficit present.     Mental Status: She is alert and oriented to person, place, and time.      Cranial Nerves: No facial asymmetry.     Sensory: Sensation is intact.   Psychiatric:        Attention and Perception: Attention normal.        Mood and Affect: Mood normal.        Speech: Speech normal.        Behavior: Behavior is cooperative.    Depression Screen    11/03/2023   12:23 PM 04/16/2023   10:46 PM 04/02/2022   10:54 PM 10/02/2021   10:43 AM  PHQ 2/9 Scores  PHQ - 2 Score 0 0 0 0   No results found for any visits on 12/09/23.  Assessment & Plan     Lung nodule seen on imaging study Assessment & Plan: S/p bronchoscopy + bx yesterday. Awaiting results   Hyperlipidemia, mixed Assessment & Plan: Pt has d/c zetia .  Recommending stay off, monitor symptoms, repeat fasting lipids in 2-3 mo Pt has never tried statins but declined them in the past.   Facial numbness Assessment & Plan: Intermittent, no other associated symptoms. Advised pt to monitor, if frequency or severity increases would refer to neurology   Itching  No rash present. Prn hydrocortisone as needed for itching.   Return in about 3 months (around 03/10/2024), or if symptoms worsen or fail to improve, for hyperlipidemia.      Manuelita Flatness, PA-C  [facility] [phone] (phone) [phone] (fax)  [facility]

## 2023-12-02 NOTE — Patient Instructions (Addendum)
 It is good to see you today. Your CT Chest does show some progression since the last scan. We discussed watchful waiting vs biopsy now for definitive diagnosis , and you prefer biopsy now.\ I have placed an order for a bronchoscopy with biopsies.  We have discussed the procedure in detail.  We have reviewed the risks and benefits of the procedure. These include bleeding, infection, puncture of the lung, and adverse reaction to anesthesia. You have agreed to proceed with biopsy to evaluate the left lobe nodule. Your procedure will be done by Dr. Racheal Buddle. You will receive a letter today with date time and information pertaining to the procedure. You will need someone to drive you to the procedure, stay with you during the procedure, and stay with you after the procedure. You will also need someone to stay with you for 24 hours after anesthesia to ensure you have cleared and are doing well. You will follow-up with me 1 week after the procedure to review the results and to ensure you are doing well. Call if you need us  prior to the procedure or if you have any questions at all. Please contact office for sooner follow up if symptoms do not improve or worsen or seek emergency care

## 2023-12-02 NOTE — Progress Notes (Unsigned)
 History of Present Illness [name] is a 73 y.o. female with ***   12/02/2023  Test Results: 11/11/2023 CT Chest The irregular left upper lobe nodule described previously has a bilobed configuration on the current study. The component of this lesion that is more cranial and anterior is clearly progressive with a more confluent soft tissue component measuring 13 x 9 mm today with only some minimal ground-glass opacity at that location previously. The more inferior and posterior component is similar to prior but does measure minimally smaller. Given findings on previous studies, this has a somewhat waxing and waning history and while neoplasm is certainly a concern, infectious/inflammatory etiology remains in the differential. Close follow-up warranted. 2. Clustered tree-in-bud nodularity posterior right upper lobe is progressive in the interval since the prior studies. Imaging features are compatible with an infectious/inflammatory process with atypical etiology a distinct consideration.  PET Scan 08/11/2023 The posterior left upper lobe/lingular nodule demonstrates low-level activity and may be smaller/less distinct than on 06/08/2023 diagnostic CT. Favored to represent resolving infection. Recommend chest CT follow-up at 3 months to confirm complete resolution.  06/07/2024 CT Chest Persistent spiculated nodule in the lingula abutting the interlobar fissure. Although this has a differential, based on appearance and size, would recommend further workup with a PET-CT scan.   There are some other small noncalcified nodules including some areas of tree-in-bud like changes in the lingula and inferior right upper lobe which could be a sequela of atypical infection.   Right thyroid  nodule. Please correlate with prior workup and biopsy.   Findings will be called to the ordering service by the Radiology physician assistant team.     Latest Ref Rng & Units 10/01/2023    6:09  AM 04/17/2023   11:33 AM 03/16/2023    4:38 PM  CBC  WBC 4.0 - 10.5 K/uL 2.7  2.7  5.9   Hemoglobin 12.0 - 15.0 g/dL 16.1  09.6  04.5   Hematocrit 36.0 - 46.0 % 36.4  34.8  38.4   Platelets 150 - 400 K/uL 154  173  149        Latest Ref Rng & Units 10/01/2023    6:09 AM 04/17/2023   11:33 AM 03/16/2023    4:38 PM  BMP  Glucose 70 - 99 mg/dL 97  84  98   BUN 8 - 23 mg/dL 19  9  15    Creatinine 0.44 - 1.00 mg/dL 4.09  8.11  9.14   BUN/Creat Ratio 6 - 22 (calc)  SEE NOTE:    Sodium 135 - 145 mmol/L 136  139  139   Potassium 3.5 - 5.1 mmol/L 3.8  4.6  3.8   Chloride 98 - 111 mmol/L 103  105  103   CO2 22 - 32 mmol/L 26  28  27    Calcium  8.9 - 10.3 mg/dL 9.6  9.6  9.8     BNP No results found for: BNP  ProBNP    Component Value Date/Time   PROBNP 49.0 05/03/2019 1117    PFT No results found for: FEV1PRE, FEV1POST, FVCPRE, FVCPOST, TLC, DLCOUNC, PREFEV1FVCRT, PSTFEV1FVCRT  CT CHEST WO CONTRAST Result Date: 11/19/2023 CLINICAL DATA:  Pulmonary nodule. EXAM: CT CHEST WITHOUT CONTRAST TECHNIQUE: Multidetector CT imaging of the chest was performed following the standard protocol without IV contrast. RADIATION DOSE REDUCTION: This exam was performed according to the departmental dose-optimization program which includes automated exposure control, adjustment of the mA and/or kV according to patient size and/or  use of iterative reconstruction technique. COMPARISON:  PET-CT 08/11/2023.  Chest CT 06/08/2023 FINDINGS: Cardiovascular: The heart size is normal. No substantial pericardial effusion. Mediastinum/Nodes: No mediastinal lymphadenopathy. Right thyroid  nodule again noted. This has been evaluated on previous imaging. No evidence for gross hilar lymphadenopathy although assessment is limited by the lack of intravenous contrast on the current study. The esophagus has normal imaging features. There is no axillary lymphadenopathy. (ref: J Am Coll Radiol. 2015 Feb;12(2): 143-50).  Lungs/Pleura: Clustered tree-in-bud nodularity posterior right upper lobe on 69/8 is progressive in the interval since the prior studies. 4 mm right middle lobe nodule on 88/8 is similar to prior.Tiny subpleural nodule posterior right upper lobe on 32/8 is probably scarring. The irregular left upper lobe nodule described previously has a bilobed configuration on the current study. The component of this lesion that is more cranial and anterior is clearly progressive with a more confluent soft tissue component measuring 13 x 9 mm on image 77/8 (compared to image 81/5 previously). More inferiorly is a second nodular component tethered to the fissure which measures 16 x 10 mm today compared to 18 x 10 mm previously (remeasured in similar axes). No focal airspace consolidation.  No pleural effusion. Upper Abdomen: Scattered tiny hypodensities in the liver parenchyma are too small to characterize but are statistically most likely benign. No followup imaging is recommended. Musculoskeletal: No worrisome lytic or sclerotic osseous abnormality. IMPRESSION: 1. The irregular left upper lobe nodule described previously has a bilobed configuration on the current study. The component of this lesion that is more cranial and anterior is clearly progressive with a more confluent soft tissue component measuring 13 x 9 mm today with only some minimal ground-glass opacity at that location previously. The more inferior and posterior component is similar to prior but does measure minimally smaller. Given findings on previous studies, this has a somewhat waxing and waning history and while neoplasm is certainly a concern, infectious/inflammatory etiology remains in the differential. Close follow-up warranted. 2. Clustered tree-in-bud nodularity posterior right upper lobe is progressive in the interval since the prior studies. Imaging features are compatible with an infectious/inflammatory process with atypical etiology a distinct  consideration. Electronically Signed   By: Donnal Fusi M.D.   On: 11/19/2023 07:41     Past medical hx Past Medical History:  Diagnosis Date   Arthritis    cervical and lumbar   Cataract    Mixed OU   Costochondritis 08/27/2020   Depression    GERD (gastroesophageal reflux disease)    Hyperlipidemia    IBS (irritable bowel syndrome)    Osteoporosis    Prediabetes    Vitamin D  deficiency      Social History   Tobacco Use   Smoking status: Never    Passive exposure: Past   Smokeless tobacco: Never  Vaping Use   Vaping status: Never Used  Substance Use Topics   Alcohol use: Yes    Comment: Rare   Drug use: No    Ms.Finger reports that she has never smoked. She has been exposed to tobacco smoke. She has never used smokeless tobacco. She reports current alcohol use. She reports that she does not use drugs.  Tobacco Cessation: Counseling given: Not Answered   Past surgical hx, Family hx, Social hx all reviewed.  Current Outpatient Medications on File Prior to Visit  Medication Sig   Acetaminophen  (TYLENOL  8 HOUR PO) Take by mouth.   alum & mag hydroxide-simeth (GERI-LANTA) 200-200-20 MG/5ML suspension Take by mouth.  aspirin  81 MG tablet Take 81 mg by mouth daily.   Bacillus Coagulans-Inulin (ALIGN PREBIOTIC-PROBIOTIC PO) Take 1 tablet by mouth daily.   Cholecalciferol  125 MCG (5000 UT) capsule Take 5,000 Units by mouth daily. Taking 4,000 units   cyclobenzaprine  (FLEXERIL ) 10 MG tablet Take 1/2 to 1 tablet 3 x /day as needed for Muscle Spasm                                /                                                                   TAKE                                         BY                                                 MOUTH   Estradiol  (VAGIFEM ) 10 MCG TABS vaginal tablet Place 1 tablet (10 mcg total) vaginally 2 (two) times a week. Use every night before bed for two weeks when you first begin this medicine, then after the first two weeks, begin using it  twice a week.   ezetimibe  (ZETIA ) 10 MG tablet Take 1 tablet Daily  for Cholesterol                              /                                                                   TAKE                                         BY                                                 MOUTH   Hyoscyamine Sulfate (HYOSCYAMINE PO) Take by mouth as needed.   ibuprofen (ADVIL) 400 MG tablet Take 400 mg by mouth every 6 (six) hours as needed.   loratadine  (CLARITIN ) 10 MG tablet Take 1 tablet (10 mg total) by mouth 2 (two) times daily as needed for allergies (Can take an extra dose during flare ups.). Take 1 tablet 1-2 times daily   Lutein 20 MG TABS Take by mouth.   Magnesium  250 MG TABS    Multiple Vitamin (MULTIVITAMIN) tablet Take 1 tablet by mouth daily.   Polyethylene Glycol 3350 (MIRALAX PO) Take  by mouth at bedtime.   RESTASIS  0.05 % ophthalmic emulsion instill 1 drop into both eyes twice a day   vitamin B-12 (CYANOCOBALAMIN) 1000 MCG tablet Take 1,000 mcg by mouth 3 (three) times a week.   Fluticasone  Propionate (FLONASE  ALLERGY  RELIEF NA) Place 2 sprays into the nose as needed. (Patient not taking: Reported on 12/02/2023)   gabapentin  (NEURONTIN ) 100 MG capsule Take 100 mg by mouth 3 (three) times daily. BID (Patient not taking: Reported on 12/02/2023)   triamcinolone  (NASACORT ) 55 MCG/ACT AERO nasal inhaler Place into the nose. (Patient not taking: Reported on 12/02/2023)   No current facility-administered medications on file prior to visit.     Allergies  Allergen Reactions   Bactrim [Sulfamethoxazole-Trimethoprim] Other (See Comments)    Patient preference to take medication without sulfa   Cephalexin  Nausea And Vomiting    Rash; chest pain Rash; chest pain   Sulfa Antibiotics     Review Of Systems:  Constitutional:   No  weight loss, night sweats,  Fevers, chills, fatigue, or  lassitude.  HEENT:   No headaches,  Difficulty swallowing,  Tooth/dental problems, or  Sore throat,                 No sneezing, itching, ear ache, nasal congestion, post nasal drip,   CV:  No chest pain,  Orthopnea, PND, swelling in lower extremities, anasarca, dizziness, palpitations, syncope.   GI  No heartburn, indigestion, abdominal pain, nausea, vomiting, diarrhea, change in bowel habits, loss of appetite, bloody stools.   Resp: No shortness of breath with exertion or at rest.  No excess mucus, no productive cough,  No non-productive cough,  No coughing up of blood.  No change in color of mucus.  No wheezing.  No chest wall deformity  Skin: no rash or lesions.  GU: no dysuria, change in color of urine, no urgency or frequency.  No flank pain, no hematuria   MS:  No joint pain or swelling.  No decreased range of motion.  No back pain.  Psych:  No change in mood or affect. No depression or anxiety.  No memory loss.   Vital Signs BP (!) 96/52 (BP Location: Left Arm, Patient Position: Sitting, Cuff Size: Normal)   Pulse (!) 57   Ht 5' 4 (1.626 m)   Wt 148 lb 6.4 oz (67.3 kg)   SpO2 99%   BMI 25.47 kg/m    Physical Exam:  General- No distress,  A&Ox3 ENT: No sinus tenderness, TM clear, pale nasal mucosa, no oral exudate,no post nasal drip, no LAN Cardiac: S1, S2, regular rate and rhythm, no murmur Chest: No wheeze/ rales/ dullness; no accessory muscle use, no nasal flaring, no sternal retractions Abd.: Soft Non-tender Ext: No clubbing cyanosis, edema Neuro:  normal strength Skin: No rashes, warm and dry Psych: normal mood and behavior   Assessment/Plan  No problem-specific Assessment & Plan notes found for this encounter.    Raejean Bullock, NP 12/02/2023  9:46 AM

## 2023-12-02 NOTE — H&P (View-Only) (Signed)
 History of Present Illness [name] is a 73 y.o. female never smoker referred 07/2023 for lung nodule consult. She will be followed by Dr. Shelah.   PMH Includes Allergies, prediabetes and IBS.  Breast biopsy 2017   Family Hx includes Cancer in her maternal grandmother, Leukemia in her maternal aunt   Synopsis Pt. Seen 08/03/2023 for evaluation of a  persistent spiculated ill-defined nodule in the lingula 18 x 10 mm with retraction of the adjacent posterior interlobar fissure.This nodule was seen on imaging when patient went to the ED 05/2023 for chest pain. PET scan was ordered to better evaluate the nodule. The PET done 08/11/2023 and read 08/31/2023  showed the left upper lobe nodule appeared less well defined, and  demonstrates low-level FDG activity and may be smaller/less distinct than on 06/08/2023 diagnostic CT. Favored to represent resolving infection. Radiology recommend 3 month follow up to assess for stability. Both the patient and her husband agreed with the plan. She is here today to review follow up CT chest , and determine best plan of care moving forward.  12/02/2023 Pt. Presents for follow up after 3 month repeat CT chest to re-evaluate the  left upper lobe nodule.  She currently has no respiratory complaints.  She states she is at her baseline.    Per the read, the nodule of concern has a  a waxing and waning pattern, located cranially and anteriorly, with progressive and more confluent soft tissue on the most recent scan.  The nodule has since increased slightly in size. She has not undergone a biopsy for this nodule yet.  We have reviewed the results of the CT scan together.  We discussed continued watchful waiting versus biopsy now for definitive diagnosis.  Both the patient and her husband feel the better option is to go ahead and schedule bronchoscopy with biopsies to determine definitive diagnosis.  Especially given her husband's recent surgery for lung cancer again in  a never smoker.  Her medical history includes a breast biopsy in 2017. She underwent surgery for a separate issue last Monday. She has no history of smoking and lives in a home with a crawl space, raising concerns about potential radon exposure. She has not tested for radon but plans to do so. Hr husband believes he has a Civil engineer, contracting in the house.    We have reviewed the risks and benefits of navigational bronchoscopy with biopsies.  Patient has agreed to move forward.   Her current medication includes daily aspirin , which she understands she will need to hold 1 day prior to the procedure. She denies being on any blood thinners. She has not been diagnosed with sleep apnea or diabetes, although she is prediabetic and not on any medications for it.  Test Results: 11/11/2023 CT Chest The irregular left upper lobe nodule described previously has a bilobed configuration on the current study. The component of this lesion that is more cranial and anterior is clearly progressive with a more confluent soft tissue component measuring 13 x 9 mm today with only some minimal ground-glass opacity at that location previously. The more inferior and posterior component is similar to prior but does measure minimally smaller. Given findings on previous studies, this has a somewhat waxing and waning history and while neoplasm is certainly a concern, infectious/inflammatory etiology remains in the differential. Close follow-up warranted. 2. Clustered tree-in-bud nodularity posterior right upper lobe is progressive in the interval since the prior studies. Imaging features are compatible with an infectious/inflammatory  process with atypical etiology a distinct consideration.  PET Scan 08/11/2023 The posterior left upper lobe/lingular nodule demonstrates low-level activity and may be smaller/less distinct than on 06/08/2023 diagnostic CT. Favored to represent resolving infection. Recommend chest CT follow-up  at 3 months to confirm complete resolution.  06/07/2024 CT Chest Persistent spiculated nodule in the lingula abutting the interlobar fissure. Although this has a differential, based on appearance and size, would recommend further workup with a PET-CT scan.   There are some other small noncalcified nodules including some areas of tree-in-bud like changes in the lingula and inferior right upper lobe which could be a sequela of atypical infection.   Right thyroid  nodule. Please correlate with prior workup and biopsy.   Findings will be called to the ordering service by the Radiology physician assistant team.     Latest Ref Rng & Units 10/01/2023    6:09 AM 04/17/2023   11:33 AM 03/16/2023    4:38 PM  CBC  WBC 4.0 - 10.5 K/uL 2.7  2.7  5.9   Hemoglobin 12.0 - 15.0 g/dL 87.7  88.3  86.8   Hematocrit 36.0 - 46.0 % 36.4  34.8  38.4   Platelets 150 - 400 K/uL 154  173  149        Latest Ref Rng & Units 10/01/2023    6:09 AM 04/17/2023   11:33 AM 03/16/2023    4:38 PM  BMP  Glucose 70 - 99 mg/dL 97  84  98   BUN 8 - 23 mg/dL 19  9  15    Creatinine 0.44 - 1.00 mg/dL 9.23  9.28  9.32   BUN/Creat Ratio 6 - 22 (calc)  SEE NOTE:    Sodium 135 - 145 mmol/L 136  139  139   Potassium 3.5 - 5.1 mmol/L 3.8  4.6  3.8   Chloride 98 - 111 mmol/L 103  105  103   CO2 22 - 32 mmol/L 26  28  27    Calcium  8.9 - 10.3 mg/dL 9.6  9.6  9.8     BNP No results found for: BNP  ProBNP    Component Value Date/Time   PROBNP 49.0 05/03/2019 1117    PFT No results found for: FEV1PRE, FEV1POST, FVCPRE, FVCPOST, TLC, DLCOUNC, PREFEV1FVCRT, PSTFEV1FVCRT  CT CHEST WO CONTRAST Result Date: 11/19/2023 CLINICAL DATA:  Pulmonary nodule. EXAM: CT CHEST WITHOUT CONTRAST TECHNIQUE: Multidetector CT imaging of the chest was performed following the standard protocol without IV contrast. RADIATION DOSE REDUCTION: This exam was performed according to the departmental dose-optimization program which  includes automated exposure control, adjustment of the mA and/or kV according to patient size and/or use of iterative reconstruction technique. COMPARISON:  PET-CT 08/11/2023.  Chest CT 06/08/2023 FINDINGS: Cardiovascular: The heart size is normal. No substantial pericardial effusion. Mediastinum/Nodes: No mediastinal lymphadenopathy. Right thyroid  nodule again noted. This has been evaluated on previous imaging. No evidence for gross hilar lymphadenopathy although assessment is limited by the lack of intravenous contrast on the current study. The esophagus has normal imaging features. There is no axillary lymphadenopathy. (ref: J Am Coll Radiol. 2015 Feb;12(2): 143-50). Lungs/Pleura: Clustered tree-in-bud nodularity posterior right upper lobe on 69/8 is progressive in the interval since the prior studies. 4 mm right middle lobe nodule on 88/8 is similar to prior.Tiny subpleural nodule posterior right upper lobe on 32/8 is probably scarring. The irregular left upper lobe nodule described previously has a bilobed configuration on the current study. The component of this lesion that is  more cranial and anterior is clearly progressive with a more confluent soft tissue component measuring 13 x 9 mm on image 77/8 (compared to image 81/5 previously). More inferiorly is a second nodular component tethered to the fissure which measures 16 x 10 mm today compared to 18 x 10 mm previously (remeasured in similar axes). No focal airspace consolidation.  No pleural effusion. Upper Abdomen: Scattered tiny hypodensities in the liver parenchyma are too small to characterize but are statistically most likely benign. No followup imaging is recommended. Musculoskeletal: No worrisome lytic or sclerotic osseous abnormality. IMPRESSION: 1. The irregular left upper lobe nodule described previously has a bilobed configuration on the current study. The component of this lesion that is more cranial and anterior is clearly progressive with a  more confluent soft tissue component measuring 13 x 9 mm today with only some minimal ground-glass opacity at that location previously. The more inferior and posterior component is similar to prior but does measure minimally smaller. Given findings on previous studies, this has a somewhat waxing and waning history and while neoplasm is certainly a concern, infectious/inflammatory etiology remains in the differential. Close follow-up warranted. 2. Clustered tree-in-bud nodularity posterior right upper lobe is progressive in the interval since the prior studies. Imaging features are compatible with an infectious/inflammatory process with atypical etiology a distinct consideration. Electronically Signed   By: Camellia Candle M.D.   On: 11/19/2023 07:41     Past medical hx Past Medical History:  Diagnosis Date   Arthritis    cervical and lumbar   Cataract    Mixed OU   Costochondritis 08/27/2020   Depression    GERD (gastroesophageal reflux disease)    Hyperlipidemia    IBS (irritable bowel syndrome)    Osteoporosis    Prediabetes    Vitamin D  deficiency      Social History   Tobacco Use   Smoking status: Never    Passive exposure: Past   Smokeless tobacco: Never  Vaping Use   Vaping status: Never Used  Substance Use Topics   Alcohol use: Yes    Comment: Rare   Drug use: No    Ms.Benedicto reports that she has never smoked. She has been exposed to tobacco smoke. She has never used smokeless tobacco. She reports current alcohol use. She reports that she does not use drugs.  Tobacco Cessation: Counseling given: Not Answered Ms. Ismael is a never smoker  Past surgical hx, Family hx, Social hx all reviewed.  Current Outpatient Medications on File Prior to Visit  Medication Sig   Acetaminophen  (TYLENOL  8 HOUR PO) Take by mouth.   alum & mag hydroxide-simeth (GERI-LANTA) 200-200-20 MG/5ML suspension Take by mouth.   aspirin  81 MG tablet Take 81 mg by mouth daily.   Bacillus  Coagulans-Inulin (ALIGN PREBIOTIC-PROBIOTIC PO) Take 1 tablet by mouth daily.   Cholecalciferol  125 MCG (5000 UT) capsule Take 5,000 Units by mouth daily. Taking 4,000 units   cyclobenzaprine  (FLEXERIL ) 10 MG tablet Take 1/2 to 1 tablet 3 x /day as needed for Muscle Spasm                                /  TAKE                                         BY                                                 MOUTH   Estradiol  (VAGIFEM ) 10 MCG TABS vaginal tablet Place 1 tablet (10 mcg total) vaginally 2 (two) times a week. Use every night before bed for two weeks when you first begin this medicine, then after the first two weeks, begin using it twice a week.   ezetimibe  (ZETIA ) 10 MG tablet Take 1 tablet Daily  for Cholesterol                              /                                                                   TAKE                                         BY                                                 MOUTH   Hyoscyamine Sulfate (HYOSCYAMINE PO) Take by mouth as needed.   ibuprofen (ADVIL) 400 MG tablet Take 400 mg by mouth every 6 (six) hours as needed.   loratadine  (CLARITIN ) 10 MG tablet Take 1 tablet (10 mg total) by mouth 2 (two) times daily as needed for allergies (Can take an extra dose during flare ups.). Take 1 tablet 1-2 times daily   Lutein 20 MG TABS Take by mouth.   Magnesium  250 MG TABS    Multiple Vitamin (MULTIVITAMIN) tablet Take 1 tablet by mouth daily.   Polyethylene Glycol 3350 (MIRALAX PO) Take by mouth at bedtime.   RESTASIS  0.05 % ophthalmic emulsion instill 1 drop into both eyes twice a day   vitamin B-12 (CYANOCOBALAMIN) 1000 MCG tablet Take 1,000 mcg by mouth 3 (three) times a week.   Fluticasone  Propionate (FLONASE  ALLERGY  RELIEF NA) Place 2 sprays into the nose as needed. (Patient not taking: Reported on 12/02/2023)   gabapentin  (NEURONTIN ) 100 MG capsule Take 100 mg by mouth 3 (three) times daily. BID  (Patient not taking: Reported on 12/02/2023)   triamcinolone  (NASACORT ) 55 MCG/ACT AERO nasal inhaler Place into the nose. (Patient not taking: Reported on 12/02/2023)   No current facility-administered medications on file prior to visit.     Allergies  Allergen Reactions   Bactrim [Sulfamethoxazole-Trimethoprim] Other (See Comments)    Patient preference to take medication without sulfa   Cephalexin  Nausea And Vomiting    Rash; chest pain Rash; chest pain   Sulfa Antibiotics  Review Of Systems:  Constitutional:   No  weight loss, night sweats,  Fevers, chills, fatigue, or  lassitude.  HEENT:   No headaches,  Difficulty swallowing,  Tooth/dental problems, or  Sore throat,                No sneezing, itching, ear ache, nasal congestion, post nasal drip,   CV:  No chest pain,  Orthopnea, PND, swelling in lower extremities, anasarca, dizziness, palpitations, syncope.   GI  No heartburn, indigestion, abdominal pain, nausea, vomiting, diarrhea, change in bowel habits, loss of appetite, bloody stools.   Resp: No shortness of breath with exertion or at rest.  No excess mucus, no productive cough,  No non-productive cough,  No coughing up of blood.  No change in color of mucus.  No wheezing.  No chest wall deformity  Skin: no rash or lesions.  GU: no dysuria, change in color of urine, no urgency or frequency.  No flank pain, no hematuria   MS:  No joint pain or swelling.  No decreased range of motion.  No back pain.  Psych:  No change in mood or affect. No depression or anxiety.  No memory loss.   Vital Signs BP (!) 96/52 (BP Location: Left Arm, Patient Position: Sitting, Cuff Size: Normal)   Pulse (!) 57   Ht 5' 4 (1.626 m)   Wt 148 lb 6.4 oz (67.3 kg)   SpO2 99%   BMI 25.47 kg/m    Physical Exam:  General- No distress,  A&Ox3, pleasant and appropriate ENT: No sinus tenderness, TM clear, pale nasal mucosa, no oral exudate,no post nasal drip, no LAN Cardiac: S1, S2,  regular rate and rhythm, no murmur Chest: No wheeze/ rales/ dullness; no accessory muscle use, no nasal flaring, no sternal retractions, clear throughout Abd.: Soft Non-tender, nondistended, bowel sounds positive,Body mass index is 25.47 kg/m.  Ext: No clubbing cyanosis, edema, no obvious deformities Neuro:  normal strength, moving all extremities x 4, alert and oriented x 3, appropriate Skin: No rashes, warm and dry, no obvious skin lesions Psych: normal mood and behavior, appropriately concerned   Assessment/Plan Irregular left upper lobe nodule in a never smoker Progressive nodule with increased size on recent CT, raising concern for neoplasm.  Biopsy needed to determine nature due to cancer potential. Plan - Schedule bronchoscopy with biopsy at [facility] on December 08, 2023. - Hold aspirin  the day before and the day of the procedure. - Ensure no food or drink after midnight before the procedure, except a sip of water for medications. - Arrange transportation to and from the procedure. - Discussed risks of bronchoscopy including bleeding, infection, pneumothorax, and need for chest tube if lung is punctured. - Informed consent given I have placed an order for a bronchoscopy with biopsies.  We have discussed the procedure in detail.  We have reviewed the risks and benefits of the procedure. These include bleeding, infection, puncture of the lung, and adverse reaction to anesthesia. You have agreed to proceed with biopsy to evaluate the left lobe nodule. Your procedure will be done by Dr. Lamar Chris. You will receive a letter today with date time and information pertaining to the procedure. You will need someone to drive you to the procedure, stay with you during the procedure, and stay with you after the procedure. You will also need someone to stay with you for 24 hours after anesthesia to ensure you have cleared and are doing well. You will follow-up with me  1 week after the  procedure to review the results and to ensure you are doing well. Call if you need us  prior to the procedure or if you have any questions at all. Please contact office for sooner follow up if symptoms do not improve or worsen or seek emergency care      I spent 35 minutes dedicated to the care of this patient on the date of this encounter to include pre-visit review of records, face-to-face time with the patient discussing conditions above, post visit ordering of testing, clinical documentation with the electronic health record, making appropriate referrals as documented, and communicating necessary information to the patient's healthcare team.    Lauraine JULIANNA Lites, NP 12/02/2023  9:46 AM

## 2023-12-03 ENCOUNTER — Encounter: Payer: Self-pay | Admitting: Acute Care

## 2023-12-07 ENCOUNTER — Other Ambulatory Visit: Payer: Self-pay

## 2023-12-07 ENCOUNTER — Encounter (HOSPITAL_COMMUNITY): Payer: Self-pay | Admitting: Emergency Medicine

## 2023-12-07 NOTE — Progress Notes (Signed)
 PCP -  not currently, was Dr Tonita  Cardiologist - Dr Redell Shallow Allergy  & Asthma - Dr Camellia Denis Pulmonary - Lauraine Lites, NP Jodi GLENWOOD Charmaine Aniceto, PA-C  CT Chest x-ray - 11/19/23 EKG - 10/01/23 Stress Test - 08/29/20 ECHO - 04/12/18 Cardiac Cath - 2011  ICD Pacemaker/Loop - n/a  Sleep Study -  n/a CPAP - none  Diabetes - n/a  Blood Thinner Instructions:  n/a  Aspirin  Instructions: Hold ASA day before and day of procedure per MD.    NPO  Anesthesia review: no  STOP now taking any Aspirin  (unless otherwise instructed by your surgeon), Aleve, Naproxen, Ibuprofen, Motrin, Advil, Goody's, BC's, all herbal medications, fish oil, and all vitamins.   Coronavirus Screening Do you have any of the following symptoms:  Cough yes/no: No Fever (>100.3F)  yes/no: No Runny nose yes/no: No Sore throat yes/no: No Difficulty breathing/shortness of breath  yes/no: No  Have you traveled in the last 14 days and where? yes/no: No  Patient verbalized understanding of instructions that were given via phone.

## 2023-12-08 ENCOUNTER — Encounter (HOSPITAL_COMMUNITY): Payer: Self-pay | Admitting: Emergency Medicine

## 2023-12-08 ENCOUNTER — Ambulatory Visit (HOSPITAL_COMMUNITY)

## 2023-12-08 ENCOUNTER — Other Ambulatory Visit: Payer: Self-pay

## 2023-12-08 ENCOUNTER — Encounter (HOSPITAL_COMMUNITY)
Admission: RE | Disposition: A | Discharge status: Home / Self Care | Payer: Self-pay | Source: Home / Self Care | Attending: Emergency Medicine

## 2023-12-08 ENCOUNTER — Ambulatory Visit (HOSPITAL_COMMUNITY)
Admission: RE | Admit: 2023-12-08 | Discharge: 2023-12-08 | Disposition: A | Discharge status: Home / Self Care | Attending: Emergency Medicine | Admitting: Emergency Medicine

## 2023-12-08 DIAGNOSIS — R911 Solitary pulmonary nodule: Secondary | ICD-10-CM

## 2023-12-08 DIAGNOSIS — I10 Essential (primary) hypertension: Secondary | ICD-10-CM | POA: Diagnosis not present

## 2023-12-08 DIAGNOSIS — R7303 Prediabetes: Secondary | ICD-10-CM | POA: Diagnosis not present

## 2023-12-08 DIAGNOSIS — Z853 Personal history of malignant neoplasm of breast: Secondary | ICD-10-CM | POA: Insufficient documentation

## 2023-12-08 DIAGNOSIS — K219 Gastro-esophageal reflux disease without esophagitis: Secondary | ICD-10-CM | POA: Diagnosis not present

## 2023-12-08 HISTORY — PX: VIDEO BRONCHOSCOPY WITH ENDOBRONCHIAL NAVIGATION: SHX6175

## 2023-12-08 LAB — CBC
HCT: 36.5 % (ref 36.0–46.0)
Hemoglobin: 12.1 g/dL (ref 12.0–15.0)
MCH: 28.3 pg (ref 26.0–34.0)
MCHC: 33.2 g/dL (ref 30.0–36.0)
MCV: 85.5 fL (ref 80.0–100.0)
Platelets: 169 10*3/uL (ref 150–400)
RBC: 4.27 MIL/uL (ref 3.87–5.11)
RDW: 13.9 % (ref 11.5–15.5)
WBC: 2.3 10*3/uL — ABNORMAL LOW (ref 4.0–10.5)
nRBC: 0 % (ref 0.0–0.2)

## 2023-12-08 SURGERY — VIDEO BRONCHOSCOPY WITH ENDOBRONCHIAL NAVIGATION
Anesthesia: General | Laterality: Left

## 2023-12-08 MED ORDER — EPHEDRINE SULFATE-NACL 50-0.9 MG/10ML-% IV SOSY
PREFILLED_SYRINGE | INTRAVENOUS | Status: DC | PRN
  Administered 2023-12-08: 10 mg via INTRAVENOUS

## 2023-12-08 MED ORDER — EPHEDRINE 5 MG/ML INJ
INTRAVENOUS | Status: AC
  Filled 2023-12-08: qty 10

## 2023-12-08 MED ORDER — PROPOFOL 10 MG/ML IV BOLUS
INTRAVENOUS | Status: DC | PRN
  Administered 2023-12-08: 100 mg via INTRAVENOUS

## 2023-12-08 MED ORDER — FENTANYL CITRATE (PF) 100 MCG/2ML IJ SOLN
INTRAMUSCULAR | Status: AC
  Filled 2023-12-08: qty 2

## 2023-12-08 MED ORDER — ONDANSETRON HCL 4 MG/2ML IJ SOLN
INTRAMUSCULAR | Status: DC | PRN
Start: 2023-12-08 — End: 2023-12-08
  Administered 2023-12-08: 4 mg via INTRAVENOUS

## 2023-12-08 MED ORDER — PHENYLEPHRINE 80 MCG/ML (10ML) SYRINGE FOR IV PUSH (FOR BLOOD PRESSURE SUPPORT)
PREFILLED_SYRINGE | INTRAVENOUS | Status: DC | PRN
  Administered 2023-12-08 (×2): 80 ug via INTRAVENOUS

## 2023-12-08 MED ORDER — DEXAMETHASONE SODIUM PHOSPHATE 10 MG/ML IJ SOLN
INTRAMUSCULAR | Status: DC | PRN
  Administered 2023-12-08: 10 mg via INTRAVENOUS

## 2023-12-08 MED ORDER — PHENYLEPHRINE HCL-NACL 20-0.9 MG/250ML-% IV SOLN
INTRAVENOUS | Status: DC | PRN
  Administered 2023-12-08: 40 ug/min via INTRAVENOUS

## 2023-12-08 MED ORDER — LIDOCAINE 2% (20 MG/ML) 5 ML SYRINGE
INTRAMUSCULAR | Status: DC | PRN
  Administered 2023-12-08: 100 mg via INTRAVENOUS

## 2023-12-08 MED ORDER — GLYCOPYRROLATE 0.2 MG/ML IJ SOLN
INTRAMUSCULAR | Status: DC | PRN
  Administered 2023-12-08: .2 mg via INTRAVENOUS

## 2023-12-08 MED ORDER — CHLORHEXIDINE GLUCONATE 0.12 % MT SOLN
OROMUCOSAL | Status: AC
  Administered 2023-12-08: 15 mL via OROMUCOSAL
  Filled 2023-12-08: qty 15

## 2023-12-08 MED ORDER — LACTATED RINGERS IV SOLN
INTRAVENOUS | Status: DC
Start: 2023-12-08 — End: 2023-12-08

## 2023-12-08 MED ORDER — FENTANYL CITRATE (PF) 250 MCG/5ML IJ SOLN
INTRAMUSCULAR | Status: DC | PRN
Start: 2023-12-08 — End: 2023-12-08
  Administered 2023-12-08 (×2): 50 ug via INTRAVENOUS

## 2023-12-08 MED ORDER — CHLORHEXIDINE GLUCONATE 0.12 % MT SOLN: 15.0000 mL | Freq: Once | OROMUCOSAL | Status: AC

## 2023-12-08 MED ORDER — ROCURONIUM BROMIDE 10 MG/ML (PF) SYRINGE
PREFILLED_SYRINGE | INTRAVENOUS | Status: DC | PRN
  Administered 2023-12-08: 50 mg via INTRAVENOUS

## 2023-12-08 MED ORDER — SUGAMMADEX SODIUM 200 MG/2ML IV SOLN
INTRAVENOUS | Status: DC | PRN
  Administered 2023-12-08: 240 mg via INTRAVENOUS

## 2023-12-08 MED ORDER — DEXMEDETOMIDINE HCL IN NACL 80 MCG/20ML IV SOLN
INTRAVENOUS | Status: DC | PRN
  Administered 2023-12-08: 8 ug via INTRAVENOUS

## 2023-12-08 SURGICAL SUPPLY — 36 items
ADAPTER BRONCHOSCOPE OLYMPUS (ADAPTER) ×2 IMPLANT
ADAPTER VALVE BIOPSY EBUS (MISCELLANEOUS) IMPLANT
BAG COUNTER SPONGE SURGICOUNT (BAG) ×2 IMPLANT
BRUSH CYTOL CELLEBRITY 1.5X140 (MISCELLANEOUS) ×2 IMPLANT
BRUSH SUPERTRAX BIOPSY (INSTRUMENTS) IMPLANT
BRUSH SUPERTRAX NDL-TIP CYTO (INSTRUMENTS) ×2 IMPLANT
CANISTER SUCTION 3000ML PPV (SUCTIONS) ×2 IMPLANT
CNTNR URN SCR LID CUP LEK RST (MISCELLANEOUS) ×2 IMPLANT
COVER BACK TABLE 60X90IN (DRAPES) ×2 IMPLANT
FILTER STRAW FLUID ASPIR (MISCELLANEOUS) IMPLANT
FORCEPS BIOP 1.5 SINGLE USE (MISCELLANEOUS) ×2 IMPLANT
FORCEPS BIOP SUPERTRX PREMAR (INSTRUMENTS) ×2 IMPLANT
GAUZE SPONGE 4X4 12PLY STRL (GAUZE/BANDAGES/DRESSINGS) ×2 IMPLANT
GLOVE BIO SURGEON STRL SZ7.5 (GLOVE) ×4 IMPLANT
GOWN STRL REUS W/ TWL LRG LVL3 (GOWN DISPOSABLE) ×4 IMPLANT
KIT CLEAN ENDO COMPLIANCE (KITS) ×2 IMPLANT
KIT LOCATABLE GUIDE (CANNULA) IMPLANT
KIT MARKER FIDUCIAL DELIVERY (KITS) IMPLANT
KIT TURNOVER KIT B (KITS) ×2 IMPLANT
MARKER SKIN DUAL TIP RULER LAB (MISCELLANEOUS) ×2 IMPLANT
NDL SUPERTRX PREMARK BIOPSY (NEEDLE) ×2 IMPLANT
NEEDLE SUPERTRX PREMARK BIOPSY (NEEDLE) ×1 IMPLANT
NS IRRIG 1000ML POUR BTL (IV SOLUTION) ×2 IMPLANT
OIL SILICONE PENTAX (PARTS (SERVICE/REPAIRS)) ×2 IMPLANT
PAD ARMBOARD POSITIONER FOAM (MISCELLANEOUS) ×4 IMPLANT
PATCHES PATIENT (LABEL) ×6 IMPLANT
SYR 20ML ECCENTRIC (SYRINGE) ×2 IMPLANT
SYR 20ML LL LF (SYRINGE) ×2 IMPLANT
SYR 50ML SLIP (SYRINGE) ×2 IMPLANT
TOWEL GREEN STERILE FF (TOWEL DISPOSABLE) ×2 IMPLANT
TRAP SPECIMEN MUCUS 40CC (MISCELLANEOUS) IMPLANT
TUBE CONNECTING 20X1/4 (TUBING) ×2 IMPLANT
UNDERPAD 30X36 HEAVY ABSORB (UNDERPADS AND DIAPERS) ×2 IMPLANT
VALVE BIOPSY SINGLE USE (MISCELLANEOUS) ×2 IMPLANT
VALVE SUCTION BRONCHIO DISP (MISCELLANEOUS) ×2 IMPLANT
WATER STERILE IRR 1000ML POUR (IV SOLUTION) ×2 IMPLANT

## 2023-12-08 NOTE — Interval H&P Note (Signed)
 History and Physical Interval Note:  12/08/2023 9:30 AM  [name]  has presented today for surgery, with the diagnosis of lung nodule.  The various methods of treatment have been discussed with the patient and family. After consideration of risks, benefits and other options for treatment, the patient has consented to  Procedure(s): VIDEO BRONCHOSCOPY WITH ENDOBRONCHIAL NAVIGATION (Left) as a surgical intervention.  The patient's history has been reviewed, patient examined, no change in status, stable for surgery.  I have reviewed the patient's chart and labs.  Questions were answered to the patient's satisfaction.     Lamar GORMAN Chris

## 2023-12-08 NOTE — Anesthesia Postprocedure Evaluation (Signed)
 Anesthesia Post Note  Patient: [name]  Procedure(s) Performed: VIDEO BRONCHOSCOPY WITH ENDOBRONCHIAL NAVIGATION (Left)     Patient location during evaluation: PACU Anesthesia Type: General Level of consciousness: awake and alert Pain management: pain level controlled Vital Signs Assessment: post-procedure vital signs reviewed and stable Respiratory status: spontaneous breathing, nonlabored ventilation, respiratory function stable and patient connected to nasal cannula oxygen Cardiovascular status: blood pressure returned to baseline and stable Postop Assessment: no apparent nausea or vomiting Anesthetic complications: no   No notable events documented.  Last Vitals:  Vitals:   12/08/23 1250 12/08/23 1300  BP: 98/63 101/71  Pulse: 77 78  Resp: 12 19  Temp:    SpO2: 95% 98%    Last Pain:  Vitals:   12/08/23 1300  TempSrc:   PainSc: 0-No pain                 [name]

## 2023-12-08 NOTE — Discharge Instructions (Signed)

## 2023-12-08 NOTE — Anesthesia Procedure Notes (Signed)
 Procedure Name: Intubation Date/Time: 12/08/2023 11:06 AM  Performed by: Elby Raelene SAUNDERS, CRNAPre-anesthesia Checklist: Patient identified, Emergency Drugs available, Suction available and Patient being monitored Patient Re-evaluated:Patient Re-evaluated prior to induction Oxygen Delivery Method: Circle System Utilized Preoxygenation: Pre-oxygenation with 100% oxygen Induction Type: IV induction Ventilation: Mask ventilation without difficulty Laryngoscope Size: Miller and 2 Grade View: Grade I Tube type: Oral Tube size: 8.5 mm Number of attempts: 1 Airway Equipment and Method: Stylet Placement Confirmation: ETT inserted through vocal cords under direct vision, positive ETCO2 and breath sounds checked- equal and bilateral Secured at: 21 cm Tube secured with: Tape Dental Injury: Teeth and Oropharynx as per pre-operative assessment

## 2023-12-08 NOTE — Op Note (Signed)
 Procedure Note  Patient: [name] Strength  [employer] Cios mobile C-arm was utilized to identify and biopsy a left upper lobe pulmonary nodule.  Needle-in-lesion was confirmed using real-time Cios imaging, and images were uploaded to PACS.   Lamar Chris, MD, PhD 12/08/2023, 12:04 PM [facility] [phone] or if no answer before 7:00PM call [phone] For any issues after 7:00PM please call eLink [phone]

## 2023-12-08 NOTE — Anesthesia Preprocedure Evaluation (Signed)
 Anesthesia Evaluation  Patient identified by MRN, date of birth, ID band Patient awake    Reviewed: Allergy  & Precautions, NPO status , Patient's Chart, lab work & pertinent test results, reviewed documented beta blocker date and time   Airway Mallampati: II  TM Distance: >3 FB     Dental no notable dental hx.    Pulmonary neg sleep apnea, neg COPD, neg PE   breath sounds clear to auscultation       Cardiovascular hypertension, (-) CAD, (-) Past MI and (-) Cardiac Stents  Rhythm:Regular Rate:Normal     Neuro/Psych neg Seizures PSYCHIATRIC DISORDERS  Depression       GI/Hepatic ,GERD  ,,(+) neg Cirrhosis        Endo/Other    Renal/GU Renal disease     Musculoskeletal  (+) Arthritis , Osteoarthritis,    Abdominal   Peds  Hematology   Anesthesia Other Findings   Reproductive/Obstetrics                              Anesthesia Physical Anesthesia Plan  ASA: 2  Anesthesia Plan: General   Post-op Pain Management:    Induction: Intravenous  PONV Risk Score and Plan: 2 and Ondansetron  and Dexamethasone   Airway Management Planned: Oral ETT  Additional Equipment:   Intra-op Plan:   Post-operative Plan:   Informed Consent: I have reviewed the patients History and Physical, chart, labs and discussed the procedure including the risks, benefits and alternatives for the proposed anesthesia with the patient or authorized representative who has indicated his/her understanding and acceptance.     Dental advisory given  Plan Discussed with: CRNA  Anesthesia Plan Comments:          Anesthesia Quick Evaluation

## 2023-12-08 NOTE — Op Note (Signed)
 Video Bronchoscopy with Robotic Assisted Bronchoscopic Navigation   Date of Operation: 12/08/2023   Pre-op Diagnosis: Left upper lobe nodule  Post-op Diagnosis: Left upper lobe nodule  Surgeon: Lamar Chris  Assistants: None  Anesthesia: General endotracheal anesthesia  Operation: Flexible video fiberoptic bronchoscopy with robotic assistance and biopsies.  Estimated Blood Loss: Minimal  Complications: None  Indications and History: [name] is a 73 y.o. female with history of breast cancer.  She was found to have a left upper lobe pulmonary nodule on surveillance imaging.  Recommendation made to achieve a tissue diagnosis via robotic assisted navigational bronchoscopy.  The risks, benefits, complications, treatment options and expected outcomes were discussed with the patient.  The possibilities of pneumothorax, pneumonia, reaction to medication, pulmonary aspiration, perforation of a viscus, bleeding, failure to diagnose a condition and creating a complication requiring transfusion or operation were discussed with the patient who freely signed the consent.    Description of Procedure: The patient was seen in the Preoperative Area, was examined and was deemed appropriate to proceed.  The patient was taken to [facility] Endoscopy room 3, identified as [name] and the procedure verified as Flexible Video Fiberoptic Bronchoscopy.  A Time Out was held and the above information confirmed.   Prior to the date of the procedure a high-resolution CT scan of the chest was performed. Utilizing ION software program a virtual tracheobronchial tree was generated to allow the creation of distinct navigation pathways to the patient's parenchymal abnormalities. After being taken to the operating room general anesthesia was initiated and the patient  was orally intubated. The video fiberoptic bronchoscope was introduced via the endotracheal tube and a general inspection was performed which showed  normal right and left lung anatomy. Aspiration of the bilateral mainstems was completed to remove any remaining secretions. Robotic catheter inserted into patient's endotracheal tube.   Target #1 left upper lobe nodule: The distinct navigation pathways prepared prior to this procedure were then utilized to navigate to patient's lesion identified on CT scan. The robotic catheter was secured into place and the vision probe was withdrawn.  Lesion location was approximated using fluoroscopy.  Local registration and targeting was performed using [employer] Cios mobile C-arm three-dimensional imaging. Under fluoroscopic guidance transbronchial needle biopsies and transbronchial forceps biopsies were performed to be sent for cytology and pathology.  Needle-in-lesion was confirmed using Cios mobile C-arm.  A bronchioalveolar lavage was performed in the left upper lobe and sent for cytology and microbiology.     At the end of the procedure a general airway inspection was performed and there was no evidence of active bleeding. The bronchoscope was removed.  The patient tolerated the procedure well. There was no significant blood loss and there were no obvious complications. A post-procedural chest x-ray is pending.  Samples Target #1: 1. Transbronchial Wang needle biopsies from left upper lobe nodule 2. Transbronchial forceps biopsies from left upper lobe nodule 3. Bronchoalveolar lavage from left upper lobe   Plans:  The patient will be discharged from the PACU to home when recovered from anesthesia and after chest x-ray is reviewed. We will review the cytology, pathology and microbiology results with the patient when they become available. Outpatient followup will be with Jodi Lites, NP, and Dr Chris.    Lamar Chris, MD, PhD 12/08/2023, 12:01 PM [facility] [phone] or if no answer before 7:00PM call [phone] For any issues after 7:00PM please call eLink  [phone]

## 2023-12-08 NOTE — Transfer of Care (Signed)
 Immediate Anesthesia Transfer of Care Note  Patient: [name]  Procedure(s) Performed: VIDEO BRONCHOSCOPY WITH ENDOBRONCHIAL NAVIGATION (Left)  Patient Location: Endoscopy Unit  Anesthesia Type:General  Level of Consciousness: drowsy  Airway & Oxygen Therapy: Patient Spontanous Breathing and Patient connected to face mask oxygen  Post-op Assessment: Report given to RN and Post -op Vital signs reviewed and stable  Post vital signs: Reviewed and stable  Last Vitals:  Vitals Value Taken Time  BP 112/67 12/08/23 12:17  Temp 36.1 C 12/08/23 12:17  Pulse 94 12/08/23 12:17  Resp 12 12/08/23 12:17  SpO2 99 % 12/08/23 12:17    Last Pain:  Vitals:   12/08/23 1217  TempSrc: Temporal  PainSc: 0-No pain         Complications: No notable events documented.

## 2023-12-09 ENCOUNTER — Encounter: Payer: Self-pay | Admitting: Physician Assistant

## 2023-12-09 ENCOUNTER — Ambulatory Visit: Admitting: Physician Assistant

## 2023-12-09 VITALS — BP 104/70 | HR 63 | Temp 98.2°F | Resp 16 | Ht 64.0 in | Wt 146.6 lb

## 2023-12-09 DIAGNOSIS — L299 Pruritus, unspecified: Secondary | ICD-10-CM

## 2023-12-09 DIAGNOSIS — E782 Mixed hyperlipidemia: Secondary | ICD-10-CM | POA: Diagnosis not present

## 2023-12-09 DIAGNOSIS — R200 Anesthesia of skin: Secondary | ICD-10-CM

## 2023-12-09 DIAGNOSIS — R911 Solitary pulmonary nodule: Secondary | ICD-10-CM

## 2023-12-09 NOTE — Assessment & Plan Note (Signed)
 S/p bronchoscopy + bx yesterday. Awaiting results

## 2023-12-09 NOTE — Assessment & Plan Note (Addendum)
 Pt has d/c zetia .  Recommending stay off, monitor symptoms, repeat fasting lipids in 2-3 mo Pt has never tried statins but declined them in the past.

## 2023-12-09 NOTE — Assessment & Plan Note (Signed)
 Intermittent, no other associated symptoms. Advised pt to monitor, if frequency or severity increases would refer to neurology

## 2023-12-10 ENCOUNTER — Encounter (HOSPITAL_COMMUNITY): Payer: Self-pay | Admitting: Emergency Medicine

## 2023-12-10 LAB — CULTURE, BAL-QUANTITATIVE W GRAM STAIN: Gram Stain: NONE SEEN

## 2023-12-10 LAB — ACID FAST SMEAR (AFB, MYCOBACTERIA): Acid Fast Smear: NEGATIVE

## 2023-12-10 LAB — CYTOLOGY - NON PAP

## 2023-12-13 LAB — AEROBIC/ANAEROBIC CULTURE W GRAM STAIN (SURGICAL/DEEP WOUND): Culture: NO GROWTH

## 2023-12-14 ENCOUNTER — Telehealth: Payer: Self-pay

## 2023-12-14 ENCOUNTER — Encounter: Payer: Self-pay | Admitting: Acute Care

## 2023-12-14 ENCOUNTER — Ambulatory Visit: Admitting: Acute Care

## 2023-12-14 VITALS — BP 101/64 | HR 64 | Ht 64.0 in | Wt 145.8 lb

## 2023-12-14 DIAGNOSIS — R911 Solitary pulmonary nodule: Secondary | ICD-10-CM

## 2023-12-14 DIAGNOSIS — Z9889 Other specified postprocedural states: Secondary | ICD-10-CM

## 2023-12-14 DIAGNOSIS — Z789 Other specified health status: Secondary | ICD-10-CM

## 2023-12-14 NOTE — Patient Instructions (Addendum)
 It is good to see you today. I am glad you did well after the procedure. You have one biopsy that was negative for malignancy . There is another pending, which we will call you with when it results. We will do a 3 month follow up CT Chest due around 02/19/2024. You will follow up with me about 1 week after to review results. We will keep an eye on this nodule for awhile.  Call us  if you need anything. Please contact office for sooner follow up if symptoms do not improve or worsen or seek emergency care

## 2023-12-14 NOTE — Telephone Encounter (Signed)
 Called and left detailed message for patient in regards to secure chat sent via Lauraine  Can you call the patient . Let her know the second biopsy was non-diagnostic material. Our plan is still for the 3 month follow up Ct chest as we discussed today on the office. Thanks so much.

## 2023-12-14 NOTE — Progress Notes (Signed)
 History of Present Illness [name] is a 73 y.o. female never smoker referred 07/2023 for lung nodule consult. She will be followed by Dr. Shelah.    PMH Includes Allergies, prediabetes and IBS.  Breast biopsy 2017   Family Hx includes Cancer in her maternal grandmother, Leukemia in her maternal aunt   Synopsis Pt. Seen 08/03/2023 for evaluation of a  persistent spiculated ill-defined nodule in the lingula 18 x 10 mm with retraction of the adjacent posterior interlobar fissure.This nodule was seen on imaging when patient went to the ED 05/2023 for chest pain. PET scan was ordered to better evaluate the nodule. The PET done 08/11/2023 and read 08/31/2023  showed the left upper lobe nodule appeared less well defined, and  demonstrates low-level FDG activity and may be smaller/less distinct than on 06/08/2023 diagnostic CT. Favored to represent resolving infection. Radiology recommend 3 month follow up to assess for stability.  Pt. Had repeat CT Chest which was concerning for some growth of the nodule of concern. She underwent bronchoscopy with biopsies 12/08/2023. She is here today to review the cytology results.   12/14/2023 Pt. Presents for follow up after bronchoscopy with biopsies. She states she has done well after the procedure. No prolonged bleeding , no fever, discolored secretions , worsening shortness of breath, no issues with the anesthesia.   We have reviewed her cytology results. One of her two specimens is back. It was negative for malignancy. There is one that is still in process. I called pathology. They told me the second specimen was unsatisfactory , and non diagnostic material. Therefore, we will continue with our plan for a 3 month follow up scan as we had discussed. .     Test Results: Cytology 12/08/2023 A. LUNG, LUL, FINE NEEDLE ASPIRATION:  - Benign pulmonary parenchyma with chronic inflammation  - No malignant cells identified    Micro 12/08/2023 Fungal  negative AFB Negative Aerobic/ Anaerobic No Growth BAL No growth  11/11/2023 CT Chest The irregular left upper lobe nodule described previously has a bilobed configuration on the current study. The component of this lesion that is more cranial and anterior is clearly progressive with a more confluent soft tissue component measuring 13 x 9 mm today with only some minimal ground-glass opacity at that location previously. The more inferior and posterior component is similar to prior but does measure minimally smaller. Given findings on previous studies, this has a somewhat waxing and waning history and while neoplasm is certainly a concern, infectious/inflammatory etiology remains in the differential. Close follow-up warranted. 2. Clustered tree-in-bud nodularity posterior right upper lobe is progressive in the interval since the prior studies. Imaging features are compatible with an infectious/inflammatory process with atypical etiology a distinct consideration.   PET Scan 08/11/2023 The posterior left upper lobe/lingular nodule demonstrates low-level activity and may be smaller/less distinct than on 06/08/2023 diagnostic CT. Favored to represent resolving infection. Recommend chest CT follow-up at 3 months to confirm complete resolution.   06/07/2024 CT Chest Persistent spiculated nodule in the lingula abutting the interlobar fissure. Although this has a differential, based on appearance and size, would recommend further workup with a PET-CT scan.   There are some other small noncalcified nodules including some areas of tree-in-bud like changes in the lingula and inferior right upper lobe which could be a sequela of atypical infection.   Right thyroid  nodule. Please correlate with prior workup and biopsy.   Findings will be called to the ordering service by the Radiology  physician assistant team.    Latest Ref Rng & Units 12/08/2023    9:20 AM 10/01/2023    6:09 AM 04/17/2023    11:33 AM  CBC  WBC 4.0 - 10.5 K/uL 2.3  2.7  2.7   Hemoglobin 12.0 - 15.0 g/dL 87.8  87.7  88.3   Hematocrit 36.0 - 46.0 % 36.5  36.4  34.8   Platelets 150 - 400 K/uL 169  154  173        Latest Ref Rng & Units 10/01/2023    6:09 AM 04/17/2023   11:33 AM 03/16/2023    4:38 PM  BMP  Glucose 70 - 99 mg/dL 97  84  98   BUN 8 - 23 mg/dL 19  9  15    Creatinine 0.44 - 1.00 mg/dL 9.23  9.28  9.32   BUN/Creat Ratio 6 - 22 (calc)  SEE NOTE:    Sodium 135 - 145 mmol/L 136  139  139   Potassium 3.5 - 5.1 mmol/L 3.8  4.6  3.8   Chloride 98 - 111 mmol/L 103  105  103   CO2 22 - 32 mmol/L 26  28  27    Calcium  8.9 - 10.3 mg/dL 9.6  9.6  9.8     BNP No results found for: BNP  ProBNP    Component Value Date/Time   PROBNP 49.0 05/03/2019 1117    PFT No results found for: FEV1PRE, FEV1POST, FVCPRE, FVCPOST, TLC, DLCOUNC, PREFEV1FVCRT, PSTFEV1FVCRT  DG Chest Port 1 View Result Date: 12/08/2023 CLINICAL DATA:  Status post bronchoscopy with biopsy. EXAM: PORTABLE CHEST 1 VIEW COMPARISON:  March 16, 2023. FINDINGS: The heart size and mediastinal contours are within normal limits. No pneumothorax or pleural effusion is noted. Faint opacity seen in left midlung region which may be related to biopsy. The visualized skeletal structures are unremarkable. IMPRESSION: No pneumothorax or pleural effusion is noted. Electronically Signed   By: Lynwood Landy Raddle M.D.   On: 12/08/2023 13:51   DG C-ARM BRONCHOSCOPY Result Date: 12/08/2023 C-ARM BRONCHOSCOPY: Fluoroscopy was utilized by the requesting physician.  No radiographic interpretation.     Past medical hx Past Medical History:  Diagnosis Date   Arthritis    cervical and lumbar   Cataract    Mixed OU - MD just watching   Costochondritis 08/27/2020   Depression    GERD (gastroesophageal reflux disease)    Hyperlipidemia    IBS (irritable bowel syndrome)    Osteoporosis    Pre-diabetes 10/2023   Vitamin D  deficiency       Social History   Tobacco Use   Smoking status: Never    Passive exposure: Past   Smokeless tobacco: Never  Vaping Use   Vaping status: Never Used  Substance Use Topics   Alcohol use: Yes    Comment: Rare Beer   Drug use: No    Ms.Schrader reports that she has never smoked. She has been exposed to tobacco smoke. She has never used smokeless tobacco. She reports current alcohol use. She reports that she does not use drugs.  Tobacco Cessation: Counseling given: Not Answered Never smoker   Past surgical hx, Family hx, Social hx all reviewed.  Current Outpatient Medications on File Prior to Visit  Medication Sig   Acetaminophen  (TYLENOL  8 HOUR PO) Take by mouth.   alum & mag hydroxide-simeth (GERI-LANTA) 200-200-20 MG/5ML suspension Take by mouth.   aspirin  81 MG tablet Take 81 mg by mouth daily.   Bacillus  Coagulans-Inulin (ALIGN PREBIOTIC-PROBIOTIC PO) Take 1 tablet by mouth daily.   Cholecalciferol  125 MCG (5000 UT) capsule Take 5,000 Units by mouth daily. Taking 4,000 units   Estradiol  (VAGIFEM ) 10 MCG TABS vaginal tablet Place 1 tablet (10 mcg total) vaginally 2 (two) times a week. Use every night before bed for two weeks when you first begin this medicine, then after the first two weeks, begin using it twice a week.   Fluticasone  Propionate (FLONASE  ALLERGY  RELIEF NA) Place 2 sprays into the nose as needed.   Hyoscyamine Sulfate (HYOSCYAMINE PO) Take by mouth as needed.   ibuprofen (ADVIL) 400 MG tablet Take 400 mg by mouth every 6 (six) hours as needed.   loratadine  (CLARITIN ) 10 MG tablet Take 1 tablet (10 mg total) by mouth 2 (two) times daily as needed for allergies (Can take an extra dose during flare ups.). Take 1 tablet 1-2 times daily   Lutein 20 MG TABS Take by mouth.   Magnesium  250 MG TABS    Multiple Vitamin (MULTIVITAMIN) tablet Take 1 tablet by mouth daily.   Polyethylene Glycol 3350 (MIRALAX PO) Take by mouth at bedtime.   RESTASIS  0.05 % ophthalmic emulsion  instill 1 drop into both eyes twice a day   vitamin B-12 (CYANOCOBALAMIN) 1000 MCG tablet Take 1,000 mcg by mouth 3 (three) times a week.   cyclobenzaprine  (FLEXERIL ) 10 MG tablet Take 1/2 to 1 tablet 3 x /day as needed for Muscle Spasm                                /                                                                   TAKE                                         BY                                                 MOUTH (Patient not taking: Reported on 12/09/2023)   ezetimibe  (ZETIA ) 10 MG tablet Take 1 tablet Daily  for Cholesterol                              /                                                                   TAKE                                         BY  MOUTH (Patient not taking: Reported on 12/14/2023)   No current facility-administered medications on file prior to visit.     Allergies  Allergen Reactions   Bactrim [Sulfamethoxazole-Trimethoprim] Other (See Comments)    Patient preference to take medication without sulfa   Cephalexin  Nausea And Vomiting    Rash; chest pain Rash; chest pain   Sulfa Antibiotics     Review Of Systems:  Constitutional:   No  weight loss, night sweats,  Fevers, chills, fatigue, or  lassitude.  HEENT:   No headaches,  Difficulty swallowing,  Tooth/dental problems, or  Sore throat,                No sneezing, itching, ear ache, nasal congestion, post nasal drip,   CV:  No chest pain,  Orthopnea, PND, swelling in lower extremities, anasarca, dizziness, palpitations, syncope.   GI  No heartburn, indigestion, abdominal pain, nausea, vomiting, diarrhea, change in bowel habits, loss of appetite, bloody stools.   Resp: No shortness of breath with exertion or at rest.  No excess mucus, no productive cough,  No non-productive cough,  No coughing up of blood.  No change in color of mucus.  No wheezing.  No chest wall deformity  Skin: no rash or lesions.  GU: no dysuria, change in  color of urine, no urgency or frequency.  No flank pain, no hematuria   MS:  No joint pain or swelling.  No decreased range of motion.  No back pain.  Psych:  No change in mood or affect. No depression or anxiety.  No memory loss.   Vital Signs BP 101/64 (BP Location: Right Arm, Patient Position: Sitting, Cuff Size: Normal)   Pulse 64   Ht 5' 4 (1.626 m)   Wt 145 lb 12.8 oz (66.1 kg)   SpO2 97%   BMI 25.03 kg/m    Physical Exam:  General- No distress,  A&Ox3, pleasant ENT: No sinus tenderness, TM clear, pale nasal mucosa, no oral exudate,no post nasal drip, no LAN Cardiac: S1, S2, regular rate and rhythm, no murmur Chest: No wheeze/ rales/ dullness; no accessory muscle use, no nasal flaring, no sternal retractions, Abd.: Soft Non-tender, ND, BS +, Body mass index is 25.03 kg/m.  Ext: No clubbing cyanosis, edema, no obvious deformity Neuro:  normal strength, MAE x 4, A&O x 3, appropriate Skin: No rashes, warm and dry, no obvious skin lesions  Psych: normal mood and behavior   Assessment/Plan Irregular left upper lobe nodule in a never smoker Progressive nodule with increased size on recent CT, raising concern for neoplasm.  Biopsy needed to determine nature due to cancer potential. Post bronchoscopy with biopsies Biopsy #1  negative for malignancy Biopsy #2 unsatisfactory, non diagnostic material. Plan I am glad you did well after the procedure. You have one biopsy that was negative for malignancy . The second biopsy was unsatisfactory, non diagnostic material. We will do a 3 month follow up CT Chest due around 02/19/2024. You will follow up with me about 1 week after to review results. We will keep an eye on this nodule for awhile.  Call us  if you need anything. Please contact office for sooner follow up if symptoms do not improve or worsen or seek emergency care   I spent 40 minutes dedicated to the care of this patient on the date of this encounter to include  pre-visit review of records, face-to-face time with the patient discussing conditions above, post visit ordering of testing, clinical documentation with the  electronic health record, making appropriate referrals as documented, and communicating necessary information to the patient's healthcare team.      Lauraine JULIANNA Lites, NP 12/14/2023  2:23 PM

## 2023-12-16 LAB — CYTOLOGY - NON PAP

## 2024-01-01 ENCOUNTER — Telehealth: Payer: Self-pay | Admitting: Physician Assistant

## 2024-01-05 ENCOUNTER — Inpatient Hospital Stay: Admitting: Hematology and Oncology

## 2024-01-05 ENCOUNTER — Other Ambulatory Visit: Payer: Self-pay | Admitting: Hematology and Oncology

## 2024-01-05 ENCOUNTER — Inpatient Hospital Stay: Attending: Hematology and Oncology

## 2024-01-05 VITALS — BP 118/52 | HR 73 | Temp 97.3°F | Resp 14 | Wt 146.2 lb

## 2024-01-05 DIAGNOSIS — E785 Hyperlipidemia, unspecified: Secondary | ICD-10-CM | POA: Diagnosis not present

## 2024-01-05 DIAGNOSIS — K219 Gastro-esophageal reflux disease without esophagitis: Secondary | ICD-10-CM | POA: Insufficient documentation

## 2024-01-05 DIAGNOSIS — E559 Vitamin D deficiency, unspecified: Secondary | ICD-10-CM | POA: Diagnosis not present

## 2024-01-05 DIAGNOSIS — M2550 Pain in unspecified joint: Secondary | ICD-10-CM | POA: Insufficient documentation

## 2024-01-05 DIAGNOSIS — D708 Other neutropenia: Secondary | ICD-10-CM

## 2024-01-05 DIAGNOSIS — D709 Neutropenia, unspecified: Secondary | ICD-10-CM | POA: Diagnosis present

## 2024-01-05 DIAGNOSIS — R0982 Postnasal drip: Secondary | ICD-10-CM | POA: Insufficient documentation

## 2024-01-05 DIAGNOSIS — Z7982 Long term (current) use of aspirin: Secondary | ICD-10-CM | POA: Insufficient documentation

## 2024-01-05 DIAGNOSIS — M129 Arthropathy, unspecified: Secondary | ICD-10-CM | POA: Diagnosis not present

## 2024-01-05 DIAGNOSIS — M810 Age-related osteoporosis without current pathological fracture: Secondary | ICD-10-CM | POA: Insufficient documentation

## 2024-01-05 DIAGNOSIS — R0789 Other chest pain: Secondary | ICD-10-CM | POA: Diagnosis not present

## 2024-01-05 DIAGNOSIS — Z801 Family history of malignant neoplasm of trachea, bronchus and lung: Secondary | ICD-10-CM | POA: Diagnosis not present

## 2024-01-05 DIAGNOSIS — K589 Irritable bowel syndrome without diarrhea: Secondary | ICD-10-CM | POA: Diagnosis not present

## 2024-01-05 LAB — CMP (CANCER CENTER ONLY)
ALT: 15 U/L (ref 0–44)
AST: 19 U/L (ref 15–41)
Albumin: 4.3 g/dL (ref 3.5–5.0)
Alkaline Phosphatase: 96 U/L (ref 38–126)
Anion gap: 6 (ref 5–15)
BUN: 13 mg/dL (ref 8–23)
CO2: 28 mmol/L (ref 22–32)
Calcium: 9.6 mg/dL (ref 8.9–10.3)
Chloride: 105 mmol/L (ref 98–111)
Creatinine: 0.75 mg/dL (ref 0.44–1.00)
GFR, Estimated: >60 mL/min (ref >60)
Glucose, Bld: 89 mg/dL (ref 70–99)
Potassium: 3.9 mmol/L (ref 3.5–5.1)
Sodium: 139 mmol/L (ref 135–145)
Total Bilirubin: 0.5 mg/dL (ref 0.0–1.2)
Total Protein: 7.7 g/dL (ref 6.5–8.1)

## 2024-01-05 LAB — CBC WITH DIFFERENTIAL (CANCER CENTER ONLY)
Abs Immature Granulocytes: 0 K/uL (ref 0.00–0.07)
Basophils Absolute: 0 K/uL (ref 0.0–0.1)
Basophils Relative: 0 %
Eosinophils Absolute: 0.1 K/uL (ref 0.0–0.5)
Eosinophils Relative: 3 %
HCT: 38.4 % (ref 36.0–46.0)
Hemoglobin: 13 g/dL (ref 12.0–15.0)
Immature Granulocytes: 0 %
Lymphocytes Relative: 58 %
Lymphs Abs: 1.4 K/uL (ref 0.7–4.0)
MCH: 28.3 pg (ref 26.0–34.0)
MCHC: 33.9 g/dL (ref 30.0–36.0)
MCV: 83.5 fL (ref 80.0–100.0)
Monocytes Absolute: 0.2 K/uL (ref 0.1–1.0)
Monocytes Relative: 9 %
Neutro Abs: 0.7 K/uL — ABNORMAL LOW (ref 1.7–7.7)
Neutrophils Relative %: 30 %
Platelet Count: 178 K/uL (ref 150–400)
RBC: 4.6 MIL/uL (ref 3.87–5.11)
RDW: 13.8 % (ref 11.5–15.5)
WBC Count: 2.3 K/uL — ABNORMAL LOW (ref 4.0–10.5)
nRBC: 0 % (ref 0.0–0.2)

## 2024-01-05 LAB — VITAMIN B12: Vitamin B-12: 2725 pg/mL — ABNORMAL HIGH (ref 180–914)

## 2024-01-05 LAB — FOLATE: Folate: 25.1 ng/mL (ref >5.9)

## 2024-01-05 LAB — LACTATE DEHYDROGENASE: LDH: 197 U/L — ABNORMAL HIGH (ref 98–192)

## 2024-01-05 LAB — TSH: TSH: 1.05 u[IU]/mL (ref 0.350–4.500)

## 2024-01-05 NOTE — Progress Notes (Signed)
 [facility] [phone]   [phone]  PROGRESS NOTE  Patient Care Team: Loreli Kins, MD as PCP - General (Family Medicine) Kristie Lamprey, MD as Consulting Physician (Gastroenterology) Cleatus Collar, MD as Consulting Physician (Ophthalmology) Levern Hutching, MD as Consulting Physician (Cardiology)  CHIEF COMPLAINTS/PURPOSE OF CONSULTATION:  Neutropenia  HISTORY OF PRESENTING ILLNESS:  [name] 73 y.o. female returns for a follow-up for neutropenia he was last seen on 02/25/2022.  In the interim she has completed work-up and presents today to review the results.  On exam today, [name] reports that she went to the emergency department with chest discomfort and was having some facial numbness as well.  At that time she was found to have a low white blood cell count.  This was in June 2025.  She reports that she also has not seen her PCP for a while because her PCP passed away and she is in the market for a new primary care provider.  She reports that she gets cold easily and does sweat often.  She does have some postnasal drip but no major sore throat or cough.  She reports that she also does have allergies and frequent joint pains.  She is prone to rashes.  She reports that she is also being worked up for a lung nodule by the pulmonary clinic.  She reports that she has not started any new medications in the interim since our last visit.  She reports her appetite is good and her diet only restricts her sugar intake as she has pre-type 2 diabetes.  She otherwise denies any recent infections requiring antibiotic therapy.  She has had no nausea, vomiting, diarrhea, fevers.  A full 10 point ROS is otherwise negative.  MEDICAL HISTORY:  Past Medical History:  Diagnosis Date   Arthritis    cervical and lumbar   Cataract    Mixed OU - MD just watching   Costochondritis 08/27/2020   Depression    GERD (gastroesophageal reflux disease)     Hyperlipidemia    IBS (irritable bowel syndrome)    Osteoporosis    Pre-diabetes 10/2023   Vitamin D  deficiency     SURGICAL HISTORY: Past Surgical History:  Procedure Laterality Date   CARDIAC CATHETERIZATION  2011   normal (Dr. Levern)   COLONOSCOPY  10/18/2018   FOOT SURGERY Right 02/2023   Removal of bunion and hammer toe   FOOT SURGERY Right 10/16/2023   removed a screw   RADIOACTIVE SEED GUIDED EXCISIONAL BREAST BIOPSY Right 03/11/2016   Procedure: RIGHT RADIOACTIVE SEED GUIDED EXCISIONAL BREAST BIOPSY;  Surgeon: Donnice Bury, MD;  Location: [facility];  Service: General;  Laterality: Right;   UPPER GI ENDOSCOPY  12/2019   VIDEO BRONCHOSCOPY WITH ENDOBRONCHIAL NAVIGATION Left 12/08/2023   Procedure: VIDEO BRONCHOSCOPY WITH ENDOBRONCHIAL NAVIGATION;  Surgeon: Shelah Lamar RAMAN, MD;  Location: [facility];  Service: Pulmonary;  Laterality: Left;    SOCIAL HISTORY: Social History   Socioeconomic History   Marital status: Married    Spouse name: Not on file   Number of children: 0   Years of education: Not on file   Highest education level: Not on file  Occupational History   Occupation: retired Mining engineer  Tobacco Use   Smoking status: Never    Passive exposure: Past   Smokeless tobacco: Never  Vaping Use   Vaping status: Never Used  Substance and Sexual Activity   Alcohol use: Yes    Comment:  Rare Beer   Drug use: No   Sexual activity: Yes    Birth control/protection: Post-menopausal    Comment: 1st intercourse 18 yo-5 partners  Other Topics Concern   Not on file  Social History Narrative   Lives at home with spouse   Right handed   Caffeine: not much, drinks decaf 2 cups/day   Social Drivers of Corporate investment banker Strain: Not on file  Food Insecurity: Low Risk  (04/30/2023)   Received from [facility]   Hunger Vital Sign    Within the past 12 months, you worried that your food would run out before you got money  to buy more: Never true    Within the past 12 months, the food you bought just didn't last and you didn't have money to get more. : Never true  Transportation Needs: No Transportation Needs (04/30/2023)   Received from [facility]    In the past 12 months, has lack of reliable transportation kept you from medical appointments, meetings, work or from getting things needed for daily living? : No  Physical Activity: Not on file  Stress: Not on file  Social Connections: Not on file  Intimate Partner Violence: Not on file    FAMILY HISTORY: Family History  Problem Relation Age of Onset   Hypertension Mother    Other Mother        surgeries involving upper back   Diabetes Father    Heart failure Father    Cancer Maternal Grandmother        Lung   Leukemia Maternal Aunt    Allergic rhinitis Neg Hx    Angioedema Neg Hx    Asthma Neg Hx    Atopy Neg Hx    Eczema Neg Hx    Immunodeficiency Neg Hx    Urticaria Neg Hx    Migraines Neg Hx     ALLERGIES:  is allergic to bactrim [sulfamethoxazole-trimethoprim], cephalexin , and sulfa antibiotics.  MEDICATIONS:  Current Outpatient Medications  Medication Sig Dispense Refill   Acetaminophen  (TYLENOL  8 HOUR PO) Take by mouth.     alum & mag hydroxide-simeth (GERI-LANTA) 200-200-20 MG/5ML suspension Take by mouth.     aspirin  81 MG tablet Take 81 mg by mouth daily.     Bacillus Coagulans-Inulin (ALIGN PREBIOTIC-PROBIOTIC PO) Take 1 tablet by mouth daily.     Cholecalciferol  125 MCG (5000 UT) capsule Take 5,000 Units by mouth daily. Taking 4,000 units     cyclobenzaprine  (FLEXERIL ) 10 MG tablet Take 1/2 to 1 tablet 3 x /day as needed for Muscle Spasm                                /                                                                   TAKE                                         BY  MOUTH (Patient not taking: Reported on 12/09/2023) 90 tablet 3   Estradiol  (VAGIFEM )  10 MCG TABS vaginal tablet Place 1 tablet (10 mcg total) vaginally 2 (two) times a week. Use every night before bed for two weeks when you first begin this medicine, then after the first two weeks, begin using it twice a week. 34 tablet 3   ezetimibe  (ZETIA ) 10 MG tablet Take 1 tablet Daily  for Cholesterol                              /                                                                   TAKE                                         BY                                                 MOUTH (Patient not taking: Reported on 12/14/2023) 90 tablet 3   Fluticasone  Propionate (FLONASE  ALLERGY  RELIEF NA) Place 2 sprays into the nose as needed.     Hyoscyamine Sulfate (HYOSCYAMINE PO) Take by mouth as needed.     ibuprofen (ADVIL) 400 MG tablet Take 400 mg by mouth every 6 (six) hours as needed.     loratadine  (CLARITIN ) 10 MG tablet Take 1 tablet (10 mg total) by mouth 2 (two) times daily as needed for allergies (Can take an extra dose during flare ups.). Take 1 tablet 1-2 times daily 60 tablet 11   Lutein 20 MG TABS Take by mouth.     Magnesium  250 MG TABS      Multiple Vitamin (MULTIVITAMIN) tablet Take 1 tablet by mouth daily.     Polyethylene Glycol 3350 (MIRALAX PO) Take by mouth at bedtime.     RESTASIS  0.05 % ophthalmic emulsion instill 1 drop into both eyes twice a day  0   vitamin B-12 (CYANOCOBALAMIN) 1000 MCG tablet Take 1,000 mcg by mouth 3 (three) times a week.     No current facility-administered medications for this visit.    REVIEW OF SYSTEMS:   Constitutional: ( - ) fevers, ( - )  chills , ( - ) night sweats Eyes: ( - ) blurriness of vision, ( - ) double vision, ( - ) watery eyes Ears, nose, mouth, throat, and face: ( - ) mucositis, ( - ) sore throat Respiratory: ( - ) cough, ( - ) dyspnea, ( - ) wheezes Cardiovascular: ( - ) palpitation, ( - ) chest discomfort, ( - ) lower extremity swelling Gastrointestinal:  ( - ) nausea, ( - ) heartburn, ( - ) change in bowel  habits Skin: ( - ) abnormal skin rashes Lymphatics: ( - ) new lymphadenopathy, ( - ) easy bruising Neurological: ( - ) numbness, ( - ) tingling, ( - ) new weaknesses Behavioral/Psych: ( - ) mood change, ( - )  new changes  All other systems were reviewed with the patient and are negative.  PHYSICAL EXAMINATION: ECOG PERFORMANCE STATUS: 0 - Asymptomatic  There were no vitals filed for this visit.  There were no vitals filed for this visit.   GENERAL: well appearing female in NAD  SKIN: skin color, texture, turgor are normal, no rashes or significant lesions EYES: conjunctiva are pink and non-injected, sclera clear LUNGS: clear to auscultation and percussion with normal breathing effort HEART: regular rate & rhythm and no murmurs and no lower extremity edema Musculoskeletal: no cyanosis of digits and no clubbing  PSYCH: alert & oriented x 3, fluent speech NEURO: no focal motor/sensory deficits  LABORATORY DATA:  I have reviewed the data as listed    Latest Ref Rng & Units 12/08/2023    9:20 AM 10/01/2023    6:09 AM 04/17/2023   11:33 AM  CBC  WBC 4.0 - 10.5 K/uL 2.3  2.7  2.7   Hemoglobin 12.0 - 15.0 g/dL 87.8  87.7  88.3   Hematocrit 36.0 - 46.0 % 36.5  36.4  34.8   Platelets 150 - 400 K/uL 169  154  173        Latest Ref Rng & Units 10/01/2023    6:09 AM 04/17/2023   11:33 AM 03/16/2023    4:38 PM  CMP  Glucose 70 - 99 mg/dL 97  84  98   BUN 8 - 23 mg/dL 19  9  15    Creatinine 0.44 - 1.00 mg/dL 9.23  9.28  9.32   Sodium 135 - 145 mmol/L 136  139  139   Potassium 3.5 - 5.1 mmol/L 3.8  4.6  3.8   Chloride 98 - 111 mmol/L 103  105  103   CO2 22 - 32 mmol/L 26  28  27    Calcium  8.9 - 10.3 mg/dL 9.6  9.6  9.8   Total Protein 6.5 - 8.1 g/dL 6.9  6.4    Total Bilirubin 0.0 - 1.2 mg/dL 0.3  0.5    Alkaline Phos 38 - 126 U/L 99     AST 15 - 41 U/L 20  16    ALT 0 - 44 U/L 15  9     ASSESSMENT & PLAN [name] is a 73 y.o. female who presents for follow-up for  neutropenia.  # Leukopenia/Neutropenia  --Labs from 10/14/2021 and 01/22/2022 ruled out nutritional deficiencies, inflammatory process, Hepatitis B/C and HIV --Labs at last visit to emergency department on 12/08/2023 show white blood cell count 2.3, hemoglobin 12.1, MCV 85.5, platelets 169.  Labs repeated today. 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  --RTC in 1 year or sooner if neutropenia worsens   No orders of the defined types were placed in this encounter.   All questions were answered. The patient knows to call the clinic with any problems, questions or concerns.  I have spent a total of 30 minutes minutes of face-to-face and non-face-to-face time, preparing to see the patient,  performing a medically appropriate examination, counseling and educating the patient,  documenting clinical information in the electronic health record, and care coordination.   Norleen IVAR Kidney, MD Department of Hematology/Oncology [facility] at [facility] Phone: [phone] Pager: [phone] Email: [email or web address]

## 2024-01-06 LAB — FUNGUS CULTURE WITH STAIN

## 2024-01-06 LAB — FUNGUS CULTURE RESULT

## 2024-01-06 LAB — FUNGAL ORGANISM REFLEX

## 2024-01-08 LAB — METHYLMALONIC ACID, SERUM: Methylmalonic Acid, Quantitative: 98 nmol/L (ref 0–378)

## 2024-01-12 ENCOUNTER — Other Ambulatory Visit: Payer: Self-pay | Admitting: Hematology and Oncology

## 2024-01-12 ENCOUNTER — Telehealth: Payer: Self-pay | Admitting: *Deleted

## 2024-01-12 DIAGNOSIS — D708 Other neutropenia: Secondary | ICD-10-CM

## 2024-01-12 NOTE — Telephone Encounter (Signed)
 TCT patient regarding recent lab results. Spoke to her.  Per Dr. Federico, She is neutropenic but our labs did not show a clear cause. Her nutritional levels were normal. The next 2 steps would be US  liver/spleen and consideration of Bone marrow biopsy if there is no liver disease. Pt voiced understanding and is agreement with the above plan. Advised that Dr. Federico has ordered the US  and for her to expect a call from radiology scheduling. Advised that we will call her with those results. Pt voiced understanding.

## 2024-01-20 ENCOUNTER — Ambulatory Visit (HOSPITAL_COMMUNITY)
Admission: RE | Admit: 2024-01-20 | Discharge: 2024-01-20 | Disposition: A | Discharge status: Home / Self Care | Source: Ambulatory Visit | Attending: Hematology and Oncology | Admitting: Hematology and Oncology

## 2024-01-20 DIAGNOSIS — D708 Other neutropenia: Secondary | ICD-10-CM | POA: Diagnosis present

## 2024-01-21 LAB — ACID FAST CULTURE WITH REFLEXED SENSITIVITIES (MYCOBACTERIA): Acid Fast Culture: NEGATIVE

## 2024-02-01 ENCOUNTER — Telehealth: Payer: Self-pay | Admitting: *Deleted

## 2024-02-01 NOTE — Telephone Encounter (Signed)
 [name] would like to discuss the results of the US  with Dr. Federico.  Message routed to Dr. Federico

## 2024-02-04 ENCOUNTER — Ambulatory Visit: Payer: Medicare Other | Admitting: Nurse Practitioner

## 2024-02-05 ENCOUNTER — Telehealth: Payer: Self-pay | Admitting: *Deleted

## 2024-02-05 NOTE — Telephone Encounter (Signed)
 TCT patient regarding results of her liver US . Spoke with her. Advised that her US  liver did not show any overt abnormalities other than some fatty infiltration of the liver. This is something she should discuss with her GI doctor, Dr. Luis. Otherwise the cause of her low WBC was not clear. The only other remaining test would be a bone marrow biopsy, which are typically low yield for low WBC. If she would like a bone marrow we can order it, otherwise please schedule labs in 3 months time with clinic visit in 6 months  Pt voiced understanding Pt prefers to have follow up appts at this time and not do the bone marrow biopsy.  Scheduling message sent.

## 2024-02-09 ENCOUNTER — Ambulatory Visit: Admitting: Physician Assistant

## 2024-02-12 ENCOUNTER — Ambulatory Visit (HOSPITAL_BASED_OUTPATIENT_CLINIC_OR_DEPARTMENT_OTHER)
Admission: RE | Admit: 2024-02-12 | Discharge: 2024-02-12 | Disposition: A | Discharge status: Home / Self Care | Source: Ambulatory Visit | Attending: Acute Care | Admitting: Acute Care

## 2024-02-12 DIAGNOSIS — R911 Solitary pulmonary nodule: Secondary | ICD-10-CM | POA: Insufficient documentation

## 2024-02-26 ENCOUNTER — Encounter: Payer: Self-pay | Admitting: Acute Care

## 2024-02-29 ENCOUNTER — Ambulatory Visit: Admitting: Acute Care

## 2024-03-04 ENCOUNTER — Ambulatory Visit: Admitting: Acute Care

## 2024-03-04 ENCOUNTER — Encounter: Payer: Self-pay | Admitting: Acute Care

## 2024-03-04 VITALS — BP 122/60 | HR 57 | Temp 98.0°F | Ht 64.5 in | Wt 146.6 lb

## 2024-03-04 DIAGNOSIS — R918 Other nonspecific abnormal finding of lung field: Secondary | ICD-10-CM

## 2024-03-04 DIAGNOSIS — K760 Fatty (change of) liver, not elsewhere classified: Secondary | ICD-10-CM | POA: Diagnosis not present

## 2024-03-04 DIAGNOSIS — R9389 Abnormal findings on diagnostic imaging of other specified body structures: Secondary | ICD-10-CM

## 2024-03-04 DIAGNOSIS — R911 Solitary pulmonary nodule: Secondary | ICD-10-CM

## 2024-03-04 NOTE — Progress Notes (Signed)
 History of Present Illness [name] is a 73 y.o. female never smoker referred 07/2023 for lung nodule consult. She will be followed by Dr. Shelah.   PMH Includes Allergies, prediabetes and IBS.  Breast biopsy 2017   Family Hx includes Cancer in her maternal grandmother, Leukemia in her maternal aunt  Synopsis Pt. Seen 08/03/2023 for evaluation of a  persistent spiculated ill-defined nodule in the lingula 18 x 10 mm with retraction of the adjacent posterior interlobar fissure.This nodule was seen on imaging when patient went to the ED 05/2023 for chest pain. PET scan was ordered to better evaluate the nodule. The PET done 08/11/2023 and read 08/31/2023  showed the left upper lobe nodule appeared less well defined, and  demonstrates low-level FDG activity and may be smaller/less distinct than on 06/08/2023 diagnostic CT. Favored to represent resolving infection. Radiology recommend 3 month follow up to assess for stability.   Pt. Had repeat CT Chest which was concerning for some growth of the nodule of concern. She underwent bronchoscopy with biopsies 12/08/2023. One Biopsy were negative for malignancy, the other was unsatisfactory , and non diagnostic material. Plan was for a 3 month follow up Ct Chest to monitor for stability.    03/04/2024 Discussed the use of AI scribe software for clinical note transcription with the patient, who gave verbal consent to proceed.  History of Present Illness [name] is a 73 year old female who presents for follow-up imaging of her pulmonary nodules to ensure stability.    Regarding her pulmonary nodules, a recent CT scan of the chest showed near complete resolution of right lower and left upper lobe nodules, with minimal residual scarring. The right upper lobe air space disease has also shown near complete resolution. A stable five millimeter nodule remains in the right middle lobe. Pt. Denies any weight loss of hemoptysis. Plan is for a 6 month  follow up scan to ensure contiued stability.   She has a new diagnosis  of fatty liver diagnosed via abdominal ultrasound, which showed no acute abnormalities aside from the fatty liver.I will refer to medical weight management to help her with diet changes and to better manage her diabetes.   She is part of the [employer] program and has resumed physical activity, including walking and using a recumbent bicycle. She wants to consult a nutritionist to help manage her diet, particularly in balancing dietary needs related to diabetes and other health concerns. I have made the referral.     Test Results: CT Chest 02/12/2024 Resolution of the bilobed left upper lobe irregular nodularity seen previously, with minimal residual curvilinear density consistent with postinflammatory or postinfectious scarring. Near complete resolution of the clustered tree-in-bud right upper lobe nodularity seen previously, consistent with improving inflammatory or infectious etiology. Stable 5 mm right middle lobe pulmonary nodule. No specific follow-up recommended.    Latest Ref Rng & Units 01/05/2024   10:27 AM 12/08/2023    9:20 AM 10/01/2023    6:09 AM  CBC  WBC 4.0 - 10.5 K/uL 2.3  2.3  2.7   Hemoglobin 12.0 - 15.0 g/dL 86.9  87.8  87.7   Hematocrit 36.0 - 46.0 % 38.4  36.5  36.4   Platelets 150 - 400 K/uL 178  169  154        Latest Ref Rng & Units 01/05/2024   10:27 AM 10/01/2023    6:09 AM 04/17/2023   11:33 AM  BMP  Glucose 70 - 99  mg/dL 89  97  84   BUN 8 - 23 mg/dL 13  19  9    Creatinine 0.44 - 1.00 mg/dL 9.24  9.23  9.28   BUN/Creat Ratio 6 - 22 (calc)   SEE NOTE:   Sodium 135 - 145 mmol/L 139  136  139   Potassium 3.5 - 5.1 mmol/L 3.9  3.8  4.6   Chloride 98 - 111 mmol/L 105  103  105   CO2 22 - 32 mmol/L 28  26  28    Calcium  8.9 - 10.3 mg/dL 9.6  9.6  9.6     BNP No results found for: BNP  ProBNP    Component Value Date/Time   PROBNP 49.0 05/03/2019 1117    PFT No results  found for: FEV1PRE, FEV1POST, FVCPRE, FVCPOST, TLC, DLCOUNC, PREFEV1FVCRT, PSTFEV1FVCRT  CT CHEST WO CONTRAST Result Date: 02/23/2024 CLINICAL DATA:  Pulmonary nodule EXAM: CT CHEST WITHOUT CONTRAST TECHNIQUE: Multidetector CT imaging of the chest was performed following the standard protocol without IV contrast. RADIATION DOSE REDUCTION: This exam was performed according to the departmental dose-optimization program which includes automated exposure control, adjustment of the mA and/or kV according to patient size and/or use of iterative reconstruction technique. COMPARISON:  11/11/2023 FINDINGS: Cardiovascular: Unenhanced imaging of the heart is unremarkable without pericardial effusion. Normal caliber of the thoracic aorta. Assessment of the vascular lumen cannot be performed without intravenous contrast. Mediastinum/Nodes: Stable appearance of the thyroid , trachea, and esophagus. No pathologic adenopathy. Lungs/Pleura: There has been near complete resolution of the right lower left upper lobe pulmonary nodule seen on prior study, with minimal residual subpleural curvilinear density consistent with postinflammatory/postinfectious scarring. There has been near complete resolution of the clustered tree in bud nodular airspace disease within the right upper lobe, consistent with improving infection or inflammation. Stable 5 mm right middle lobe pulmonary nodule, reference image 97/3. No acute airspace disease, effusion, or pneumothorax. The central airways are patent. Upper Abdomen: No acute abnormality. Musculoskeletal: No acute or destructive bony abnormalities. Reconstructed images demonstrate no additional findings. IMPRESSION: 1. Resolution of the bilobed left upper lobe irregular nodularity seen previously, with minimal residual curvilinear density consistent with postinflammatory or postinfectious scarring. 2. Near complete resolution of the clustered tree-in-bud right upper lobe nodularity  seen previously, consistent with improving inflammatory or infectious etiology. 3. Stable 5 mm right middle lobe pulmonary nodule. No specific follow-up recommended. Electronically Signed   By: Ozell Daring M.D.   On: 02/23/2024 21:20     Past medical hx Past Medical History:  Diagnosis Date   Arthritis    cervical and lumbar   Cataract    Mixed OU - MD just watching   Costochondritis 08/27/2020   Depression    GERD (gastroesophageal reflux disease)    Hyperlipidemia    IBS (irritable bowel syndrome)    Osteoporosis    Pre-diabetes 10/2023   Vitamin D  deficiency      Social History   Tobacco Use   Smoking status: Never    Passive exposure: Past   Smokeless tobacco: Never  Vaping Use   Vaping status: Never Used  Substance Use Topics   Alcohol use: Yes    Comment: Rare Beer   Drug use: No    [name] reports that she has never smoked. She has been exposed to tobacco smoke. She has never used smokeless tobacco. She reports current alcohol use. She reports that she does not use drugs.  Tobacco Cessation: Counseling given: Not Answered Never smoker  Past surgical hx, Family hx, Social hx all reviewed.  Current Outpatient Medications on File Prior to Visit  Medication Sig   Acetaminophen  (TYLENOL  8 HOUR PO) Take by mouth.   alum & mag hydroxide-simeth (GERI-LANTA) 200-200-20 MG/5ML suspension Take by mouth.   aspirin  81 MG tablet Take 81 mg by mouth daily.   Bacillus Coagulans-Inulin (ALIGN PREBIOTIC-PROBIOTIC PO) Take 1 tablet by mouth daily.   Cholecalciferol  125 MCG (5000 UT) capsule Take 5,000 Units by mouth daily. Taking 4,000 units   Fluticasone  Propionate (FLONASE  ALLERGY  RELIEF NA) Place 2 sprays into the nose as needed.   Hyoscyamine Sulfate (HYOSCYAMINE PO) Take by mouth as needed.   ibuprofen (ADVIL) 400 MG tablet Take 400 mg by mouth every 6 (six) hours as needed.   loratadine  (CLARITIN ) 10 MG tablet Take 1 tablet (10 mg total) by mouth 2 (two) times  daily as needed for allergies (Can take an extra dose during flare ups.). Take 1 tablet 1-2 times daily   Lutein 20 MG TABS Take by mouth.   Magnesium  250 MG TABS    Multiple Vitamin (MULTIVITAMIN) tablet Take 1 tablet by mouth daily.   Polyethylene Glycol 3350 (MIRALAX PO) Take by mouth at bedtime.   RESTASIS  0.05 % ophthalmic emulsion instill 1 drop into both eyes twice a day   vitamin B-12 (CYANOCOBALAMIN ) 1000 MCG tablet Take 1,000 mcg by mouth 3 (three) times a week.   ezetimibe  (ZETIA ) 10 MG tablet Take 1 tablet Daily  for Cholesterol                              /                                                                   TAKE                                         BY                                                 MOUTH (Patient not taking: Reported on 03/04/2024)   No current facility-administered medications on file prior to visit.     Allergies  Allergen Reactions   Bactrim [Sulfamethoxazole-Trimethoprim] Other (See Comments)    Patient preference to take medication without sulfa   Cephalexin  Nausea And Vomiting    Rash; chest pain Rash; chest pain   Sulfa Antibiotics     Review Of Systems:  Constitutional:   No  weight loss, night sweats,  Fevers, chills, fatigue, or  lassitude.  HEENT:   No headaches,  Difficulty swallowing,  Tooth/dental problems, or  Sore throat,                No sneezing, itching, ear ache, nasal congestion, post nasal drip,   CV:  No chest pain,  Orthopnea, PND, swelling in lower extremities, anasarca, dizziness, palpitations, syncope.   GI  No heartburn, indigestion, abdominal pain, nausea, vomiting, diarrhea,  change in bowel habits, loss of appetite, bloody stools.   Resp: No shortness of breath with exertion or at rest.  No excess mucus, no productive cough,  No non-productive cough,  No coughing up of blood.  No change in color of mucus.  No wheezing.  No chest wall deformity  Skin: no rash or lesions.  GU: no dysuria, change in color  of urine, no urgency or frequency.  No flank pain, no hematuria   MS:  No joint pain or swelling.  No decreased range of motion.  No back pain.  Psych:  No change in mood or affect. No depression or anxiety.  No memory loss.   Vital Signs BP 122/60   Pulse (!) 57   Temp 98 F (36.7 C) (Oral)   Ht 5' 4.5 (1.638 m)   Wt 146 lb 9.6 oz (66.5 kg)   SpO2 99%   BMI 24.78 kg/m    Physical Exam:  General- No distress,  A&Ox3, pleasant ENT: No sinus tenderness, TM clear, pale nasal mucosa, no oral exudate,no post nasal drip, no LAN Cardiac: S1, S2, regular rate and rhythm, no murmur Chest: No wheeze/ rales/ dullness; no accessory muscle use, no nasal flaring, no sternal retractions  Abd.: Soft Non-tender, ND, BS + Body mass index is 24.78 kg/m. Ext: No clubbing cyanosis, edema, no obvious deformities Neuro:  normal strength, MAE x 4, A&O x 3, appropriate Skin: No rashes, warm and dry, no obvious skin lesions Psych: normal mood and behavior    Assessment & Plan Pulmonary Nodules Near resolution of left upper lobe and right upper lobe pulmonary nodules Stable 5 mm nodule with resolution of previous nodules, likely post-inflammatory or post-infectious scarring.  - Schedule follow-up CT scan in March 2026. - Follow up 1-2 weeks post-CT scan to review results. - Call for any unexplained weight loss or blood in your sputum when you cough  Fatty liver disease Fatty liver disease noted on ultrasound.  Risk of progression exists.  Management through diet and exercise to prevent diabetes. - Refer to [facility] for nutritional counseling and management to support her new diagnosis.   AVS 03/04/2024 We have reviewed your CT scan which shows resolution of the left upper lobe pulmonary nodule and near resolution of the right upper lobe pulmonary nodule. This is great news. There is a 5 mm stable right middle lobe nodule that we will continue to monitor. Plan will be for  a 72-month follow-up CT chest to ensure stability. This will be due in March 2026. You will get a call closer to the time to get this scheduled. I have also referred you to medical weight management to help with your fatty liver as well as your diabetes. You should get a call to get this scheduled. I will see you in 6 months. Have a good fall and holiday season. Call if you need us . Please contact office for sooner follow up if symptoms do not improve or worsen or seek emergency care    I spent 20 minutes dedicated to the care of this patient on the date of this encounter to include pre-visit review of records, face-to-face time with the patient discussing conditions above, post visit ordering of testing, clinical documentation with the electronic health record, making appropriate referrals as documented, and communicating necessary information to the patient's healthcare team.     Jodi JULIANNA Lites, NP 03/04/2024  9:44 AM

## 2024-03-04 NOTE — Patient Instructions (Signed)
 Is great to see today. We have reviewed your CT scan which shows resolution of the left upper lobe pulmonary nodule and near resolution of the right upper lobe pulmonary nodule. This is great news. There is a 5 mm stable right middle lobe nodule that we will continue to monitor. Plan will be for a 40-month follow-up CT chest to ensure stability. This will be due in March 2026. You will get a call closer to the time to get this scheduled. I have also referred you to medical weight management to help with your fatty liver as well as your diabetes. You should get a call to get this scheduled. I will see you in 6 months. Have a good fall and holiday season. Call if you need us . Please contact office for sooner follow up if symptoms do not improve or worsen or seek emergency care

## 2024-04-19 ENCOUNTER — Ambulatory Visit
Admission: RE | Admit: 2024-04-19 | Discharge: 2024-04-19 | Disposition: A | Discharge status: Home / Self Care | Source: Ambulatory Visit | Attending: Family Medicine | Admitting: Family Medicine

## 2024-04-19 ENCOUNTER — Other Ambulatory Visit: Payer: Self-pay | Admitting: Family Medicine

## 2024-04-19 DIAGNOSIS — M25552 Pain in left hip: Secondary | ICD-10-CM

## 2024-04-19 LAB — LAB REPORT - SCANNED
A1c: 5.9
EGFR: 90

## 2024-04-28 ENCOUNTER — Encounter: Payer: Medicare Other | Admitting: Internal Medicine

## 2024-05-03 ENCOUNTER — Other Ambulatory Visit: Payer: Self-pay

## 2024-05-03 DIAGNOSIS — D708 Other neutropenia: Secondary | ICD-10-CM

## 2024-05-04 ENCOUNTER — Inpatient Hospital Stay: Attending: Hematology and Oncology

## 2024-05-04 ENCOUNTER — Other Ambulatory Visit: Payer: Self-pay | Admitting: Hematology and Oncology

## 2024-05-04 DIAGNOSIS — D708 Other neutropenia: Secondary | ICD-10-CM

## 2024-05-09 ENCOUNTER — Encounter: Payer: Medicare Other | Admitting: Internal Medicine

## 2024-07-26 ENCOUNTER — Ambulatory Visit: Payer: Medicare Other | Admitting: Internal Medicine

## 2024-08-03 ENCOUNTER — Ambulatory Visit: Admitting: Hematology and Oncology

## 2024-08-03 ENCOUNTER — Other Ambulatory Visit

## 2024-08-03 ENCOUNTER — Inpatient Hospital Stay: Attending: Hematology and Oncology

## 2024-08-03 ENCOUNTER — Inpatient Hospital Stay: Admitting: Hematology and Oncology

## 2024-08-05 ENCOUNTER — Ambulatory Visit: Admitting: Internal Medicine

## 2025-01-04 ENCOUNTER — Ambulatory Visit: Admitting: Hematology and Oncology

## 2025-01-04 ENCOUNTER — Other Ambulatory Visit
